# Patient Record
Sex: Male | Born: 1945 | Race: Black or African American | Hispanic: No | Marital: Married | Smoking: Never smoker
Health system: Southern US, Community
[De-identification: ages and names within clinical notes are randomized; demographics above are authoritative.]

## PROBLEM LIST (undated history)

## (undated) DIAGNOSIS — I719 Aortic aneurysm of unspecified site, without rupture: Secondary | ICD-10-CM

## (undated) DIAGNOSIS — F4310 Post-traumatic stress disorder, unspecified: Secondary | ICD-10-CM

## (undated) DIAGNOSIS — G4733 Obstructive sleep apnea (adult) (pediatric): Secondary | ICD-10-CM

## (undated) DIAGNOSIS — M1990 Unspecified osteoarthritis, unspecified site: Secondary | ICD-10-CM

## (undated) DIAGNOSIS — I10 Essential (primary) hypertension: Secondary | ICD-10-CM

## (undated) DIAGNOSIS — G709 Myoneural disorder, unspecified: Secondary | ICD-10-CM

## (undated) DIAGNOSIS — Z7739 Contact with and (suspected) exposure to other war theater: Secondary | ICD-10-CM

## (undated) DIAGNOSIS — K635 Polyp of colon: Secondary | ICD-10-CM

## (undated) DIAGNOSIS — E119 Type 2 diabetes mellitus without complications: Secondary | ICD-10-CM

## (undated) DIAGNOSIS — R519 Headache, unspecified: Secondary | ICD-10-CM

## (undated) DIAGNOSIS — R51 Headache: Secondary | ICD-10-CM

## (undated) DIAGNOSIS — Z8739 Personal history of other diseases of the musculoskeletal system and connective tissue: Secondary | ICD-10-CM

## (undated) DIAGNOSIS — Z9989 Dependence on other enabling machines and devices: Secondary | ICD-10-CM

## (undated) DIAGNOSIS — E1140 Type 2 diabetes mellitus with diabetic neuropathy, unspecified: Secondary | ICD-10-CM

## (undated) DIAGNOSIS — F32A Depression, unspecified: Secondary | ICD-10-CM

## (undated) DIAGNOSIS — N189 Chronic kidney disease, unspecified: Secondary | ICD-10-CM

## (undated) DIAGNOSIS — R569 Unspecified convulsions: Secondary | ICD-10-CM

## (undated) DIAGNOSIS — D649 Anemia, unspecified: Secondary | ICD-10-CM

## (undated) DIAGNOSIS — Z9289 Personal history of other medical treatment: Secondary | ICD-10-CM

## (undated) DIAGNOSIS — E11319 Type 2 diabetes mellitus with unspecified diabetic retinopathy without macular edema: Secondary | ICD-10-CM

## (undated) DIAGNOSIS — I639 Cerebral infarction, unspecified: Secondary | ICD-10-CM

## (undated) DIAGNOSIS — Z77098 Contact with and (suspected) exposure to other hazardous, chiefly nonmedicinal, chemicals: Secondary | ICD-10-CM

## (undated) HISTORY — PX: CATARACT EXTRACTION W/ INTRAOCULAR LENS  IMPLANT, BILATERAL: SHX1307

## (undated) HISTORY — PX: EYE SURGERY: SHX253

## (undated) HISTORY — DX: Unspecified convulsions: R56.9

## (undated) HISTORY — PX: BONE GRAFT HIP ILIAC CREST: SUR159

## (undated) HISTORY — PX: TUMOR REMOVAL: SHX12

---

## 2007-12-18 ENCOUNTER — Emergency Department (HOSPITAL_COMMUNITY): Admission: EM | Admit: 2007-12-18 | Discharge: 2007-12-18 | Payer: Self-pay | Admitting: Family Medicine

## 2012-09-04 DIAGNOSIS — I639 Cerebral infarction, unspecified: Secondary | ICD-10-CM

## 2012-09-04 HISTORY — DX: Cerebral infarction, unspecified: I63.9

## 2013-02-20 ENCOUNTER — Emergency Department (HOSPITAL_COMMUNITY): Payer: Medicare Other

## 2013-02-20 ENCOUNTER — Inpatient Hospital Stay (HOSPITAL_COMMUNITY)
Admission: EM | Admit: 2013-02-20 | Discharge: 2013-02-24 | DRG: 066 | Disposition: A | Discharge status: Home / Self Care | Payer: Medicare Other | Attending: Internal Medicine | Admitting: Internal Medicine

## 2013-02-20 ENCOUNTER — Encounter (HOSPITAL_COMMUNITY): Payer: Self-pay | Admitting: Emergency Medicine

## 2013-02-20 DIAGNOSIS — E785 Hyperlipidemia, unspecified: Secondary | ICD-10-CM | POA: Diagnosis present

## 2013-02-20 DIAGNOSIS — R32 Unspecified urinary incontinence: Secondary | ICD-10-CM | POA: Diagnosis present

## 2013-02-20 DIAGNOSIS — Z9181 History of falling: Secondary | ICD-10-CM

## 2013-02-20 DIAGNOSIS — R42 Dizziness and giddiness: Secondary | ICD-10-CM | POA: Diagnosis present

## 2013-02-20 DIAGNOSIS — I10 Essential (primary) hypertension: Secondary | ICD-10-CM | POA: Diagnosis present

## 2013-02-20 DIAGNOSIS — E11319 Type 2 diabetes mellitus with unspecified diabetic retinopathy without macular edema: Secondary | ICD-10-CM | POA: Diagnosis present

## 2013-02-20 DIAGNOSIS — R531 Weakness: Secondary | ICD-10-CM | POA: Diagnosis present

## 2013-02-20 DIAGNOSIS — I6322 Cerebral infarction due to unspecified occlusion or stenosis of basilar arteries: Principal | ICD-10-CM | POA: Diagnosis present

## 2013-02-20 DIAGNOSIS — E1139 Type 2 diabetes mellitus with other diabetic ophthalmic complication: Secondary | ICD-10-CM | POA: Diagnosis present

## 2013-02-20 DIAGNOSIS — E1149 Type 2 diabetes mellitus with other diabetic neurological complication: Secondary | ICD-10-CM | POA: Diagnosis present

## 2013-02-20 DIAGNOSIS — Z7982 Long term (current) use of aspirin: Secondary | ICD-10-CM

## 2013-02-20 DIAGNOSIS — H9319 Tinnitus, unspecified ear: Secondary | ICD-10-CM | POA: Diagnosis present

## 2013-02-20 DIAGNOSIS — E1142 Type 2 diabetes mellitus with diabetic polyneuropathy: Secondary | ICD-10-CM | POA: Diagnosis present

## 2013-02-20 DIAGNOSIS — Z8673 Personal history of transient ischemic attack (TIA), and cerebral infarction without residual deficits: Secondary | ICD-10-CM

## 2013-02-20 DIAGNOSIS — I639 Cerebral infarction, unspecified: Secondary | ICD-10-CM

## 2013-02-20 DIAGNOSIS — E119 Type 2 diabetes mellitus without complications: Secondary | ICD-10-CM | POA: Diagnosis present

## 2013-02-20 DIAGNOSIS — R410 Disorientation, unspecified: Secondary | ICD-10-CM

## 2013-02-20 DIAGNOSIS — R9402 Abnormal brain scan: Secondary | ICD-10-CM | POA: Diagnosis present

## 2013-02-20 DIAGNOSIS — I152 Hypertension secondary to endocrine disorders: Secondary | ICD-10-CM | POA: Diagnosis present

## 2013-02-20 DIAGNOSIS — I672 Cerebral atherosclerosis: Secondary | ICD-10-CM | POA: Diagnosis present

## 2013-02-20 HISTORY — DX: Type 2 diabetes mellitus with diabetic neuropathy, unspecified: E11.40

## 2013-02-20 HISTORY — DX: Type 2 diabetes mellitus with unspecified diabetic retinopathy without macular edema: E11.319

## 2013-02-20 HISTORY — DX: Essential (primary) hypertension: I10

## 2013-02-20 LAB — CBC WITH DIFFERENTIAL/PLATELET
Basophils Absolute: 0 10*3/uL (ref 0.0–0.1)
Basophils Relative: 1 % (ref 0–1)
Eosinophils Absolute: 0.2 10*3/uL (ref 0.0–0.7)
Eosinophils Relative: 4 % (ref 0–5)
Lymphocytes Relative: 20 % (ref 12–46)
MCH: 30.4 pg (ref 26.0–34.0)
MCHC: 36 g/dL (ref 30.0–36.0)
MCV: 84.2 fL (ref 78.0–100.0)
Platelets: 161 10*3/uL (ref 150–400)
RDW: 13 % (ref 11.5–15.5)
WBC: 5.8 10*3/uL (ref 4.0–10.5)

## 2013-02-20 LAB — BASIC METABOLIC PANEL
Calcium: 9.3 mg/dL (ref 8.4–10.5)
GFR calc Af Amer: 54 mL/min — ABNORMAL LOW (ref >90)
GFR calc non Af Amer: 46 mL/min — ABNORMAL LOW (ref >90)
Sodium: 135 mEq/L (ref 135–145)

## 2013-02-20 LAB — URINALYSIS, ROUTINE W REFLEX MICROSCOPIC
Ketones, ur: NEGATIVE mg/dL
Leukocytes, UA: NEGATIVE
Protein, ur: 30 mg/dL — AB
Urobilinogen, UA: 1 mg/dL (ref 0.0–1.0)

## 2013-02-20 LAB — URINE MICROSCOPIC-ADD ON

## 2013-02-20 MED ORDER — SODIUM CHLORIDE 0.9 % IV SOLN: INTRAVENOUS | Status: DC

## 2013-02-20 MED ORDER — GADOBENATE DIMEGLUMINE 529 MG/ML IV SOLN
20.0000 mL | Freq: Once | INTRAVENOUS | Status: AC
  Administered 2013-02-20: 20 mL via INTRAVENOUS

## 2013-02-20 MED ORDER — SODIUM CHLORIDE 0.9 % IV BOLUS (SEPSIS)
500.0000 mL | Freq: Once | INTRAVENOUS | Status: AC
  Administered 2013-02-20: 500 mL via INTRAVENOUS

## 2013-02-20 NOTE — ED Notes (Signed)
Pt c/o weakness x 2 weeks. Pt stated to EMS that he is more weak on the right side and now is unable to walk. EMS tried to walk on scene and took 1 step and was unable to walk. Pt has been waiting 1 year to see PCP and is unable to have MRI until July. Family stated to EMS that his speech seems slurred. BP-156/93 hr-75 O2sat- 98%ra IV 20g R hand.

## 2013-02-20 NOTE — ED Notes (Signed)
Pt. C/o right sided weakness. States had an "episode" last week and hasn't fully gained back strength. Has been using a walker at home with is abnormal for pt. Pt also had two "episodes" of increased weakness today. Pt. Grip equal. Mild right sided facial droop noted. Pt. Also c/o slurred speech, none noted by this nurse but pt. States he sounds different to himself.

## 2013-02-20 NOTE — ED Provider Notes (Addendum)
History     CSN: [id number]  Arrival date & time 02/20/13  1627   First MD Initiated Contact with Patient 02/20/13 1639      Chief Complaint  Patient presents with  . Weakness    (Consider location/radiation/quality/duration/timing/severity/associated sxs/prior treatment) HPI Comments: Cody Silva is a 67 y.o. male who presents for evaluation of confusion and balance problems. The symptoms are intermittent. He has had 5 episodes. He has been evaluated by his PCP, at the [facility]. For evaluation, he has had a CT of his head and a cardiac echo. His doctor wants to do an MRA of his neck. Last week, his doctor changed his medicine, by discontinuing several antihypertensives. He was also taken off oxybutynin. Since being off the oxybutynin. He has had increasing episodes of urinary urgency. He has had to use a diaper because of frequent urinary incontinence. He does not have ongoing chest pain, shortness of breath, cough, abdominal pain, back pain, or paresthesias. He describes his balance is worse when attempting to walk. His son is with him, and thinks that he has some weakness in his right leg. The son noticed this, this morning, when the patient was trying to walk. The patient usually walks slowly. There are no other known modifying factors.  Patient is a 66 y.o. male presenting with weakness. The history is provided by the patient, the spouse and a relative.  Weakness    Past Medical History  Diagnosis Date  . Hypertension   . Diabetes mellitus without complication   . Diabetic retinopathy   . Diabetic neuropathy     Past Surgical History  Procedure Laterality Date  . Eye surgery    . Arm surgery      Tumor removed     No family history on file.  History  Substance Use Topics  . Smoking status: Never Smoker   . Smokeless tobacco: Not on file  . Alcohol Use: No      Review of Systems  Neurological: Positive for weakness.  All other systems reviewed and are  negative.    Allergies  Review of patient's allergies indicates no known allergies.  Home Medications   Current Outpatient Rx  Name  Route  Sig  Dispense  Refill  . albuterol (PROVENTIL HFA;VENTOLIN HFA) 108 (90 BASE) MCG/ACT inhaler   Inhalation   Inhale 2 puffs into the lungs every 6 (six) hours as needed for wheezing.         [name] aspirin EC 81 MG tablet   Oral   Take 81 mg by mouth daily.         . Aspirin-Salicylamide-Caffeine (BC HEADACHE POWDER PO)   Oral   Take 1 packet by mouth daily as needed (for headache).         . carvedilol (COREG) 25 MG tablet   Oral   Take 25 mg by mouth 2 (two) times daily with a meal.         . citalopram (CELEXA) 40 MG tablet   Oral   Take 20 mg by mouth daily.         . cloNIDine (CATAPRES - DOSED IN MG/24 HR) 0.3 mg/24hr   Transdermal   Place 1 patch onto the skin once a week.         . fluocinonide cream (LIDEX) 0.05 %   Topical   Apply 1 application topically 2 (two) times daily.         . fluticasone (FLONASE) 50 MCG/ACT nasal  spray   Nasal   Place 2 sprays into the nose daily.         [name] latanoprost (XALATAN) 0.005 % ophthalmic solution   Left Eye   Place 1 drop into the left eye at bedtime.         [name] losartan (COZAAR) 100 MG tablet   Oral   Take 100 mg by mouth daily.         . Multiple Vitamins-Minerals (MULTIVITAMIN PO)   Oral   Take 1 tablet by mouth daily.         . polyvinyl alcohol (LIQUIFILM TEARS) 1.4 % ophthalmic solution   Both Eyes   Place 1 drop into both eyes 5 (five) times daily as needed (for dry eyes).         . simvastatin (ZOCOR) 40 MG tablet   Oral   Take 40 mg by mouth every evening.         . Skin Protectants, Misc. (EUCERIN) cream   Topical   Apply 1 application topically as needed (for dry feet).         [name] spironolactone (ALDACTONE) 25 MG tablet   Oral   Take 25 mg by mouth daily.           BP 163/93  Pulse 70  Temp(Src) 98.1 F (36.7 C) (Oral)   Resp 17  SpO2 96%  Physical Exam  Nursing note and vitals reviewed. Constitutional: He is oriented to person, place, and time. He appears well-developed and well-nourished.  HENT:  Head: Normocephalic and atraumatic.  Right Ear: External ear normal.  Left Ear: External ear normal.  Eyes: Conjunctivae and EOM are normal. Pupils are equal, round, and reactive to light.  Neck: Normal range of motion and phonation normal. Neck supple.  Cardiovascular: Normal rate, regular rhythm, normal heart sounds and intact distal pulses.   Pulmonary/Chest: Effort normal and breath sounds normal. No respiratory distress. He has no wheezes. He has no rales. He exhibits no bony tenderness.  Abdominal: Soft. Normal appearance. There is no tenderness. There is no guarding.  Musculoskeletal: Normal range of motion.  Neurological: He is alert and oriented to person, place, and time. He has normal strength. No cranial nerve deficit or sensory deficit. He exhibits normal muscle tone. Coordination normal.  No aphasia, or dysarthria  Skin: Skin is warm, dry and intact.  Psychiatric: He has a normal mood and affect. His behavior is normal. Judgment and thought content normal.    ED Course  Procedures (including critical care time)      Date: 02/20/13  Rate: 71  Rhythm: normal sinus rhythm  QRS Axis: normal  PR and QT Intervals: normal  ST/T Wave abnormalities: nonspecific T wave changes  PR and QRS Conduction Disutrbances:none  Narrative Interpretation:   Old EKG Reviewed: none available  11:26 PM-Consult complete with Dr Nevada Crane- Radiology. Patient case explained and discussed. He believes that pt has a left. Call ended at 11:31 PM   00:06 PM-Consult complete with . Patient case explained and discussed. He agrees to see patient for further evaluation and treatment, as Optometrist. Call ended at 00:10   12:14 AM-[facility] Patient case explained and discussed. She agrees  to admit patient for further evaluation and treatment. Call ended at 12:18 AM   Results for orders placed during the hospital encounter of 02/20/13  CBC WITH DIFFERENTIAL      Result Value Range   WBC 5.8  4.0 - 10.5 K/uL  RBC 4.25  4.22 - 5.81 MIL/uL   Hemoglobin 12.9 (*) 13.0 - 17.0 g/dL   HCT 35.8 (*) 39.0 - 52.0 %   MCV 84.2  78.0 - 100.0 fL   MCH 30.4  26.0 - 34.0 pg   MCHC 36.0  30.0 - 36.0 g/dL   RDW 13.0  11.5 - 15.5 %   Platelets 161  150 - 400 K/uL   Neutrophils Relative % 71  43 - 77 %   Neutro Abs 4.1  1.7 - 7.7 K/uL   Lymphocytes Relative 20  12 - 46 %   Lymphs Abs 1.1  0.7 - 4.0 K/uL   Monocytes Relative 6  3 - 12 %   Monocytes Absolute 0.3  0.1 - 1.0 K/uL   Eosinophils Relative 4  0 - 5 %   Eosinophils Absolute 0.2  0.0 - 0.7 K/uL   Basophils Relative 1  0 - 1 %   Basophils Absolute 0.0  0.0 - 0.1 K/uL  BASIC METABOLIC PANEL      Result Value Range   Sodium 135  135 - 145 mEq/L   Potassium 3.6  3.5 - 5.1 mEq/L   Chloride 96  96 - 112 mEq/L   CO2 30  19 - 32 mEq/L   Glucose, Bld 201 (*) 70 - 99 mg/dL   BUN 24 (*) 6 - 23 mg/dL   Creatinine, Ser 1.52 (*) 0.50 - 1.35 mg/dL   Calcium 9.3  8.4 - 10.5 mg/dL   GFR calc non Af Amer 46 (*) >90 mL/min   GFR calc Af Amer 54 (*) >90 mL/min  URINALYSIS, ROUTINE W REFLEX MICROSCOPIC      Result Value Range   Color, Urine YELLOW  YELLOW   APPearance CLEAR  CLEAR   Specific Gravity, Urine 1.020  1.005 - 1.030   pH 6.0  5.0 - 8.0   Glucose, UA 100 (*) NEGATIVE mg/dL   Hgb urine dipstick NEGATIVE  NEGATIVE   Bilirubin Urine NEGATIVE  NEGATIVE   Ketones, ur NEGATIVE  NEGATIVE mg/dL   Protein, ur 30 (*) NEGATIVE mg/dL   Urobilinogen, UA 1.0  0.0 - 1.0 mg/dL   Nitrite NEGATIVE  NEGATIVE   Leukocytes, UA NEGATIVE  NEGATIVE  URINE MICROSCOPIC-ADD ON      Result Value Range   Squamous Epithelial / LPF RARE  RARE   RBC / HPF 0-2  <3 RBC/hpf   Cody Virgel Paling Wo Contrast  02/20/2013   *RADIOLOGY REPORT*  Clinical Data:   67 year old male with right side facial droop and slurred speech.  Difficulty with balance.  Right side weakness.  Contrast: 35mL MULTIHANCE GADOBENATE DIMEGLUMINE 529 MG/ML IV SOLN  Comparison: None.  MRI HEAD WITHOUT AND WITH CONTRAST  Technique: Multiplanar, multiecho pulse sequences of the brain and surrounding structures were obtained according to standard protocol without and with intravenous contrast.  Findings:  There are sharply demarcated areas of restricted diffusion in the body of the corpus callosum and cingulate gyri, more so the right.  These are associated with T2 and FLAIR hyperintensity plus expansion of the corpus callosum.  Postcontrast images show mild if any enhancement along the periphery of the abnormal signal.  The area of abnormality encompasses 44 x 26 x 13 mm (AP by transverse by CC).  No definite hemispheric white matter involvement.  No associated hemorrhage.  No other diffusion abnormality. Major intracranial vascular flow voids are preserved, with a degree of intracranial artery dolichoectasia.  See MRA findings  below.  Superimposed mild for age scattered periventricular and subcortical cerebral white matter T2 and FLAIR hyperintensity elsewhere.  No cortical encephalomalacia.  Deep gray matter nuclei, brainstem, cerebellum, pituitary, cervicomedullary junction and visualized cervical spine are within normal limits.  Visualized orbit soft tissues are within normal limits.  Visualized paranasal sinuses and mastoids are clear.  Grossly normal visualized internal auditory structures. Visualized bone marrow signal is within normal limits.  Negative scalp soft tissues.  IMPRESSION: 1.  Abnormal mass-like signal with restricted diffusion in the body of the corpus callosum, and also involving the cingulate gyri more so the right.  Minimal if any associated enhancement.  No associated hemorrhage or significant mass effect on adjacent structures. 2.  Top differential considerations for #1  include acute ACA / median artery corpus callosum infarcts (favored, see neck MRA findings below) versus a primary brain tumor simulating a stroke (moderate to high-grade glioma). 3.  In light of #2, recommend imaging surveillance (e.g. 6 - 8 weeks) to confirm expected post ischemic evolution. 4.  Otherwise mild for age nonspecific white matter signal changes.  Study discussed by telephone with Dr. Daleen Bo on 02/20/2013 at 2330 hours.  MRA HEAD WITHOUT CONTRAST  Technique: Angiographic images of the Circle of Willis were obtained using MRA technique without  intravenous contrast.  Findings:  Degraded by a combination of MOTSA and motion artifact.  Antegrade flow in the posterior circulation with relatively codominant distal vertebral arteries.  Mild to moderate distal vertebral artery irregularity, greater on the left.  Moderate stenosis of the left V4 segment proximal to the left PICA origin. Right PICA origin is patent.  Vertebrobasilar junction is patent, but the basilar artery is irregular with a moderate to severe mid basilar stenosis.  The basilar tip is patent.  SCA origins are patent.  Fetal type bilateral PCA origins, more so the left, with evidence of superimposed P1 segment atherosclerosis.  Bilateral PCA branches are within normal limits.  Antegrade flow in both ICA siphons with ICA dolichoectasia, mild irregularity, and no stenosis.  Ophthalmic and posterior communicating artery origins are within normal limits.  Carotid termini are patent, but the left A1 segment is dominant. The anterior communicating artery is present.  There is a median artery of the corpus callosum which it is difficult to visualize much beyond its origin.  The other ACA branches have more normal appearance.  Both MCA origins are within normal limits.  Bilateral MCA branches are within normal limits.  IMPRESSION: 1.  Intracranial MRA degraded by a combination of MOTSA and motion artifact. 2.  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 3.  However, see the neck MRA findings below, which also included these portions of the intracranial circulation. 4.  Dolichoectasia of the ICA siphons.  Dominant left ACA A1 segment.  MRA NECK WITHOUT AND WITH CONTRAST  Technique:  Angiographic images of the neck were obtained using MRA technique without and with intravenous contrast.  Carotid stenosis measurements (when applicable) are obtained utilizing NASCET criteria, using the distal internal carotid diameter as the denominator.  Findings:  Precontrast time-of-flight images of the neck demonstrate antegrade flow in both cervical carotid and vertebral arteries. Normal time-of-flight appearance of the carotid bifurcations.  Antegrade flow continues to the skull base.  Postcontrast images suggest a bovine arch configuration.  Great vessel origins are patent without significant stenosis.  The right common carotid artery is mildly tortuous.  There is  mild to moderate irregularity at the posterior right ICA origin compatible with atherosclerotic plaque.  However, no hemodynamically significant cervical ICA stenosis occurs.  The visible right ICA siphon is mildly [place]:  The visible right ICA siphon is mildly dolichoectatic is seen on the intracranial exam. The right ACA A1 segment is diminutive.  The left common carotid artery is normal.  The left carotid bifurcation is normal.  The cervical left ICA is normal aside from mild tortuosity.  The left ICA siphon and dolichoectatic as seen on the intracranial study.  The left ICA terminus is patent along with the MCA and ACA origins.  The proximal ACA branches appear normal. There is a median artery of the corpus callosum which demonstrates irregularity and a 6 mm flow gap at the level of the body of the corpus callosum.  There does appear to be preserved distal flow.  No hemodynamically significant proximal subclavian artery  stenosis is identified.  Both vertebral artery origins are within normal limits.  Proximal left vertebral arteries tortuous.  The cervical vertebral arteries are relatively codominant and within normal limits throughout the neck.  Intracranial vertebral arteries are mildly irregular, more so the left.  There does not appear to be hemodynamically significant stenosis at the distal left vertebral artery or vertebrobasilar junction.  There is irregularity of the mid basilar artery, the these images suggest at most moderate stenosis.  IMPRESSION: 1.  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  This supports the ischemic etiology very of the MRI abnormality. 2. The MRA images do not suggest a hemodynamically significant basilar artery stenosis.  Also, the distal vertebral arteries appear more normal than they did on the intracranial portion. 3.  Mild atherosclerosis at the right ICA origin without hemodynamically significant stenosis. 4.  Otherwise negative neck MRA.   Original Report Authenticated By: Roselyn Reef, M.D.   Cody Angiogram Neck W Wo Contrast  02/20/2013   *RADIOLOGY REPORT*  Clinical Data:  67 year old male with right side facial droop and slurred speech.  Difficulty with balance.  Right side weakness.  Contrast: 21mL MULTIHANCE GADOBENATE DIMEGLUMINE 529 MG/ML IV SOLN  Comparison: None.  MRI HEAD WITHOUT AND WITH CONTRAST  Technique: Multiplanar, multiecho pulse sequences of the brain and surrounding structures were obtained according to standard protocol without and with intravenous contrast.  Findings:  There are sharply demarcated areas of restricted diffusion in the body of the corpus callosum and cingulate gyri, more so the right.  These are associated with T2 and FLAIR hyperintensity plus expansion of the corpus callosum.  Postcontrast images show mild if any enhancement along the periphery of the  abnormal signal.  The area of abnormality encompasses 44 x 26 x 13 mm (AP by transverse by CC).  No definite hemispheric white matter involvement.  No associated hemorrhage.  No other diffusion abnormality. Major intracranial vascular flow voids are preserved, with a degree of intracranial artery dolichoectasia.  See MRA findings below.  Superimposed mild for age scattered periventricular and subcortical cerebral white matter T2 and FLAIR hyperintensity elsewhere.  No cortical encephalomalacia.  Deep gray matter nuclei, brainstem, cerebellum, pituitary, cervicomedullary junction and visualized cervical spine are within normal limits.  Visualized orbit soft tissues are within normal limits.  Visualized paranasal sinuses and mastoids are clear.  Grossly normal visualized internal auditory structures. Visualized bone marrow signal is within normal limits.  Negative scalp soft tissues.  IMPRESSION: 1.  Abnormal mass-like signal with restricted diffusion in the body of the corpus callosum, and also involving the cingulate gyri more so the right.  Minimal if any associated enhancement.  No associated hemorrhage or significant mass effect on adjacent structures. 2.  Top differential considerations for #1 include acute ACA / median artery corpus callosum infarcts (favored, see neck MRA findings below) versus a primary brain tumor simulating a stroke (moderate to high-grade glioma). 3.  In light of #2, recommend imaging surveillance (e.g. 6 - 8 weeks) to confirm expected post ischemic evolution. 4.  Otherwise mild for age nonspecific white matter signal changes.  Study discussed by telephone with Dr. Daleen Bo on 02/20/2013 at 2330 hours.  MRA HEAD WITHOUT CONTRAST  Technique: Angiographic images of the Circle of Willis were obtained using MRA technique without  intravenous contrast.  Findings:  Degraded by a combination of MOTSA and motion artifact.  Antegrade flow in the posterior circulation with relatively codominant  distal vertebral arteries.  Mild to moderate distal vertebral artery irregularity, greater on the left.  Moderate stenosis of the left V4 segment proximal to the left PICA origin. Right PICA origin is patent.  Vertebrobasilar junction is patent, but the basilar artery is irregular with a moderate to severe mid basilar stenosis.  The basilar tip is patent.  SCA origins are patent.  Fetal type bilateral PCA origins, more so the left, with evidence of superimposed P1 segment atherosclerosis.  Bilateral PCA branches are within normal limits.  Antegrade flow in both ICA siphons with ICA dolichoectasia, mild irregularity, and no stenosis.  Ophthalmic and posterior communicating artery origins are within normal limits.  Carotid termini are patent, but the left A1 segment is dominant. The anterior communicating artery is present.  There is a median artery of the corpus callosum which it is difficult to visualize much beyond its origin.  The other ACA branches have more normal appearance.  Both MCA origins are within normal limits.  Bilateral MCA branches are within normal limits.  IMPRESSION: 1.  Intracranial MRA degraded by a combination of MOTSA and motion artifact. 2.  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 3.  However, see the neck MRA findings below, which also included these portions of the intracranial circulation. 4.  Dolichoectasia of the ICA siphons.  Dominant left ACA A1 segment.  MRA NECK WITHOUT AND WITH CONTRAST  Technique:  Angiographic images of the neck were obtained using MRA technique without and with intravenous contrast.  Carotid stenosis measurements (when applicable) are obtained utilizing NASCET criteria, using the distal internal carotid diameter as the denominator.  Findings:  Precontrast time-of-flight images of the neck demonstrate antegrade flow in both cervical carotid and vertebral arteries. Normal  time-of-flight appearance of the carotid bifurcations.  Antegrade flow continues to the skull base.  Postcontrast images suggest a bovine arch configuration.  Great vessel origins are patent without significant stenosis.  The right common carotid artery is mildly tortuous.  There is mild to moderate irregularity at the posterior right ICA origin compatible with atherosclerotic plaque.  However, no hemodynamically significant cervical ICA stenosis occurs.  The visible right ICA siphon is mildly [place]:  The visible right ICA siphon is mildly dolichoectatic is seen on the intracranial exam. The right ACA A1 segment is diminutive.  The left common carotid artery is normal.  The left carotid bifurcation is normal.  The cervical left ICA is normal aside from  mild tortuosity.  The left ICA siphon and dolichoectatic as seen on the intracranial study.  The left ICA terminus is patent along with the MCA and ACA origins.  The proximal ACA branches appear normal. There is a median artery of the corpus callosum which demonstrates irregularity and a 6 mm flow gap at the level of the body of the corpus callosum.  There does appear to be preserved distal flow.  No hemodynamically significant proximal subclavian artery stenosis is identified.  Both vertebral artery origins are within normal limits.  Proximal left vertebral arteries tortuous.  The cervical vertebral arteries are relatively codominant and within normal limits throughout the neck.  Intracranial vertebral arteries are mildly irregular, more so the left.  There does not appear to be hemodynamically significant stenosis at the distal left vertebral artery or vertebrobasilar junction.  There is irregularity of the mid basilar artery, the these images suggest at most moderate stenosis.  IMPRESSION: 1.  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  This supports the ischemic  etiology very of the MRI abnormality. 2. The MRA images do not suggest a hemodynamically significant basilar artery stenosis.  Also, the distal vertebral arteries appear more normal than they did on the intracranial portion. 3.  Mild atherosclerosis at the right ICA origin without hemodynamically significant stenosis. 4.  Otherwise negative neck MRA.   Original Report Authenticated By: Roselyn Reef, M.D.   Cody Silva Wo Contrast  02/20/2013   *RADIOLOGY REPORT*  Clinical Data:  67 year old male with right side facial droop and slurred speech.  Difficulty with balance.  Right side weakness.  Contrast: 53mL MULTIHANCE GADOBENATE DIMEGLUMINE 529 MG/ML IV SOLN  Comparison: None.  MRI HEAD WITHOUT AND WITH CONTRAST  Technique: Multiplanar, multiecho pulse sequences of the brain and surrounding structures were obtained according to standard protocol without and with intravenous contrast.  Findings:  There are sharply demarcated areas of restricted diffusion in the body of the corpus callosum and cingulate gyri, more so the right.  These are associated with T2 and FLAIR hyperintensity plus expansion of the corpus callosum.  Postcontrast images show mild if any enhancement along the periphery of the abnormal signal.  The area of abnormality encompasses 44 x 26 x 13 mm (AP by transverse by CC).  No definite hemispheric white matter involvement.  No associated hemorrhage.  No other diffusion abnormality. Major intracranial vascular flow voids are preserved, with a degree of intracranial artery dolichoectasia.  See MRA findings below.  Superimposed mild for age scattered periventricular and subcortical cerebral white matter T2 and FLAIR hyperintensity elsewhere.  No cortical encephalomalacia.  Deep gray matter nuclei, brainstem, cerebellum, pituitary, cervicomedullary junction and visualized cervical spine are within normal limits.  Visualized orbit soft tissues are within normal limits.  Visualized paranasal sinuses and  mastoids are clear.  Grossly normal visualized internal auditory structures. Visualized bone marrow signal is within normal limits.  Negative scalp soft tissues.  IMPRESSION: 1.  Abnormal mass-like signal with restricted diffusion in the body of the corpus callosum, and also involving the cingulate gyri more so the right.  Minimal if any associated enhancement.  No associated hemorrhage or significant mass effect on adjacent structures. 2.  Top differential considerations for #1 include acute ACA / median artery corpus callosum infarcts (favored, see neck MRA findings below) versus a primary brain tumor simulating a stroke (moderate to high-grade glioma). 3.  In light of #2, recommend imaging surveillance (e.g. 6 - 8 weeks) to confirm expected post ischemic evolution. 4.  Otherwise mild for age nonspecific white matter signal changes.  Study discussed by telephone with Dr. Daleen Bo on 02/20/2013 at 2330 hours.  MRA HEAD WITHOUT CONTRAST  Technique: Angiographic images of the Circle of [place] were obtained using MRA technique without  intravenous contrast.  Findings:  Degraded by a combination of MOTSA and motion artifact.  Antegrade flow in the posterior circulation with relatively codominant distal vertebral arteries.  Mild to moderate distal vertebral artery irregularity, greater on the left.  Moderate stenosis of the left V4 segment proximal to the left PICA origin. Right PICA origin is patent.  Vertebrobasilar junction is patent, but the basilar artery is irregular with a moderate to severe mid basilar stenosis.  The basilar tip is patent.  SCA origins are patent.  Fetal type bilateral PCA origins, more so the left, with evidence of superimposed P1 segment atherosclerosis.  Bilateral PCA branches are within normal limits.  Antegrade flow in both ICA siphons with ICA dolichoectasia, mild irregularity, and no stenosis.  Ophthalmic and posterior communicating artery origins are within normal limits.  Carotid  termini are patent, but the left A1 segment is dominant. The anterior communicating artery is present.  There is a median artery of the corpus callosum which it is difficult to visualize much beyond its origin.  The other ACA branches have more normal appearance.  Both MCA origins are within normal limits.  Bilateral MCA branches are within normal limits.  IMPRESSION: 1.  Intracranial MRA degraded by a combination of MOTSA and motion artifact. 2.  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 3.  However, see the neck MRA findings below, which also included these portions of the intracranial circulation. 4.  Dolichoectasia of the ICA siphons.  Dominant left ACA A1 segment.  MRA NECK WITHOUT AND WITH CONTRAST  Technique:  Angiographic images of the neck were obtained using MRA technique without and with intravenous contrast.  Carotid stenosis measurements (when applicable) are obtained utilizing NASCET criteria, using the distal internal carotid diameter as the denominator.  Findings:  Precontrast time-of-flight images of the neck demonstrate antegrade flow in both cervical carotid and vertebral arteries. Normal time-of-flight appearance of the carotid bifurcations.  Antegrade flow continues to the skull base.  Postcontrast images suggest a bovine arch configuration.  Great vessel origins are patent without significant stenosis.  The right common carotid artery is mildly tortuous.  There is mild to moderate irregularity at the posterior right ICA origin compatible with atherosclerotic plaque.  However, no hemodynamically significant cervical ICA stenosis occurs.  The visible right ICA siphon is mildly [place]:  The visible right ICA siphon is mildly dolichoectatic is seen on the intracranial exam. The right ACA A1 segment is diminutive.  The left common carotid artery is normal.  The left carotid bifurcation is normal.  The cervical left  ICA is normal aside from mild tortuosity.  The left ICA siphon and dolichoectatic as seen on the intracranial study.  The left ICA terminus is patent along with the MCA and ACA origins.  The proximal ACA branches appear normal. There is a median artery of the corpus callosum which demonstrates irregularity and a 6 mm flow gap at the level of the body of the corpus callosum.  There does appear to be preserved distal flow.  No hemodynamically significant proximal subclavian  artery stenosis is identified.  Both vertebral artery origins are within normal limits.  Proximal left vertebral arteries tortuous.  The cervical vertebral arteries are relatively codominant and within normal limits throughout the neck.  Intracranial vertebral arteries are mildly irregular, more so the left.  There does not appear to be hemodynamically significant stenosis at the distal left vertebral artery or vertebrobasilar junction.  There is irregularity of the mid basilar artery, the these images suggest at most moderate stenosis.  IMPRESSION: 1.  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  This supports the ischemic etiology very of the MRI abnormality. 2. The MRA images do not suggest a hemodynamically significant basilar artery stenosis.  Also, the distal vertebral arteries appear more normal than they did on the intracranial portion. 3.  Mild atherosclerosis at the right ICA origin without hemodynamically significant stenosis. 4.  Otherwise negative neck MRA.   Original Report Authenticated By: Roselyn Reef, M.D.     1. CVA (cerebral infarction)   2. Confusion       MDM  Brain infarct versus tumor requiring admission for further diagnostic evaluation and treatment  Plan: Admit to hospitalist with consultation by neurological, hospitalist        Richarda Blade, MD 02/21/13 [place], MD 02/21/13 [phone]

## 2013-02-20 NOTE — ED Notes (Signed)
EKG given to Dr. Eulis Foster. Copy placed in pt chart.

## 2013-02-21 ENCOUNTER — Encounter (HOSPITAL_COMMUNITY): Payer: Self-pay | Admitting: Internal Medicine

## 2013-02-21 ENCOUNTER — Inpatient Hospital Stay (HOSPITAL_COMMUNITY): Payer: Medicare Other

## 2013-02-21 DIAGNOSIS — R5383 Other fatigue: Secondary | ICD-10-CM

## 2013-02-21 DIAGNOSIS — E1159 Type 2 diabetes mellitus with other circulatory complications: Secondary | ICD-10-CM | POA: Diagnosis present

## 2013-02-21 DIAGNOSIS — I359 Nonrheumatic aortic valve disorder, unspecified: Secondary | ICD-10-CM

## 2013-02-21 DIAGNOSIS — R531 Weakness: Secondary | ICD-10-CM | POA: Diagnosis present

## 2013-02-21 DIAGNOSIS — R42 Dizziness and giddiness: Secondary | ICD-10-CM

## 2013-02-21 DIAGNOSIS — F29 Unspecified psychosis not due to a substance or known physiological condition: Secondary | ICD-10-CM

## 2013-02-21 DIAGNOSIS — E119 Type 2 diabetes mellitus without complications: Secondary | ICD-10-CM | POA: Diagnosis present

## 2013-02-21 DIAGNOSIS — I6350 Cerebral infarction due to unspecified occlusion or stenosis of unspecified cerebral artery: Secondary | ICD-10-CM

## 2013-02-21 DIAGNOSIS — R9409 Abnormal results of other function studies of central nervous system: Secondary | ICD-10-CM

## 2013-02-21 DIAGNOSIS — R9402 Abnormal brain scan: Secondary | ICD-10-CM | POA: Diagnosis present

## 2013-02-21 DIAGNOSIS — I10 Essential (primary) hypertension: Secondary | ICD-10-CM

## 2013-02-21 LAB — GLUCOSE, CAPILLARY
Glucose-Capillary: 170 mg/dL — ABNORMAL HIGH (ref 70–99)
Glucose-Capillary: 199 mg/dL — ABNORMAL HIGH (ref 70–99)
Glucose-Capillary: 265 mg/dL — ABNORMAL HIGH (ref 70–99)
Glucose-Capillary: 166 mg/dL — ABNORMAL HIGH (ref 70–99)

## 2013-02-21 LAB — LIPID PANEL: LDL Cholesterol: 55 mg/dL (ref 0–99)

## 2013-02-21 LAB — HEMOGLOBIN A1C: Hgb A1c MFr Bld: 7.5 % — ABNORMAL HIGH (ref <5.7)

## 2013-02-21 LAB — URINE CULTURE

## 2013-02-21 MED ORDER — HYDRALAZINE HCL 20 MG/ML IJ SOLN
10.0000 mg | Freq: Once | INTRAMUSCULAR | Status: AC
  Administered 2013-02-21: 10 mg via INTRAVENOUS
  Filled 2013-02-21: qty 1

## 2013-02-21 MED ORDER — ASPIRIN EC 81 MG PO TBEC
81.0000 mg | DELAYED_RELEASE_TABLET | Freq: Every day | ORAL | Status: DC
  Administered 2013-02-21 – 2013-02-22 (×2): 81 mg via ORAL
  Filled 2013-02-21 (×2): qty 1

## 2013-02-21 MED ORDER — SIMVASTATIN 40 MG PO TABS
40.0000 mg | ORAL_TABLET | Freq: Every evening | ORAL | Status: DC
  Administered 2013-02-21 – 2013-02-23 (×3): 40 mg via ORAL
  Filled 2013-02-21 (×4): qty 1

## 2013-02-21 MED ORDER — ACETAMINOPHEN 650 MG RE SUPP: 650.0000 mg | RECTAL | Status: DC | PRN

## 2013-02-21 MED ORDER — LATANOPROST 0.005 % OP SOLN
1.0000 [drp] | Freq: Every day | OPHTHALMIC | Status: DC
  Administered 2013-02-21 – 2013-02-23 (×3): 1 [drp] via OPHTHALMIC
  Filled 2013-02-21 (×2): qty 2.5

## 2013-02-21 MED ORDER — SENNOSIDES-DOCUSATE SODIUM 8.6-50 MG PO TABS
1.0000 | ORAL_TABLET | Freq: Every evening | ORAL | Status: DC | PRN
  Administered 2013-02-24: 1 via ORAL
  Filled 2013-02-21: qty 1

## 2013-02-21 MED ORDER — ALBUTEROL SULFATE HFA 108 (90 BASE) MCG/ACT IN AERS: 2.0000 | INHALATION_SPRAY | Freq: Four times a day (QID) | RESPIRATORY_TRACT | Status: DC | PRN

## 2013-02-21 MED ORDER — CLONIDINE HCL 0.3 MG/24HR TD PTWK
0.3000 mg | MEDICATED_PATCH | TRANSDERMAL | Status: DC
  Administered 2013-02-21: 0.3 mg via TRANSDERMAL
  Filled 2013-02-21: qty 1

## 2013-02-21 MED ORDER — CITALOPRAM HYDROBROMIDE 20 MG PO TABS
20.0000 mg | ORAL_TABLET | Freq: Every day | ORAL | Status: DC
  Administered 2013-02-21 – 2013-02-24 (×4): 20 mg via ORAL
  Filled 2013-02-21 (×4): qty 1

## 2013-02-21 MED ORDER — SPIRONOLACTONE 25 MG PO TABS
25.0000 mg | ORAL_TABLET | Freq: Every day | ORAL | Status: DC
  Administered 2013-02-21 – 2013-02-24 (×4): 25 mg via ORAL
  Filled 2013-02-21 (×4): qty 1

## 2013-02-21 MED ORDER — SODIUM CHLORIDE 0.9 % IV SOLN
INTRAVENOUS | Status: DC
  Administered 2013-02-21: 05:00:00 via INTRAVENOUS

## 2013-02-21 MED ORDER — INSULIN ASPART 100 UNIT/ML ~~LOC~~ SOLN
0.0000 [IU] | Freq: Three times a day (TID) | SUBCUTANEOUS | Status: DC
  Administered 2013-02-21: 5 [IU] via SUBCUTANEOUS
  Administered 2013-02-21 (×2): 2 [IU] via SUBCUTANEOUS
  Administered 2013-02-22 (×2): 3 [IU] via SUBCUTANEOUS
  Administered 2013-02-22: 2 [IU] via SUBCUTANEOUS
  Administered 2013-02-23 (×2): 3 [IU] via SUBCUTANEOUS
  Administered 2013-02-23: 2 [IU] via SUBCUTANEOUS
  Administered 2013-02-24 (×2): 3 [IU] via SUBCUTANEOUS

## 2013-02-21 MED ORDER — ACETAMINOPHEN 325 MG PO TABS
650.0000 mg | ORAL_TABLET | ORAL | Status: DC | PRN
  Administered 2013-02-22 – 2013-02-24 (×4): 650 mg via ORAL
  Filled 2013-02-21 (×4): qty 2

## 2013-02-21 MED ORDER — POLYVINYL ALCOHOL 1.4 % OP SOLN: 1.0000 [drp] | Freq: Every day | OPHTHALMIC | Status: DC | PRN

## 2013-02-21 MED ORDER — CARVEDILOL 25 MG PO TABS
25.0000 mg | ORAL_TABLET | Freq: Two times a day (BID) | ORAL | Status: DC
  Administered 2013-02-21 – 2013-02-24 (×7): 25 mg via ORAL
  Filled 2013-02-21 (×9): qty 1

## 2013-02-21 MED ORDER — INSULIN ASPART 100 UNIT/ML ~~LOC~~ SOLN
0.0000 [IU] | Freq: Every day | SUBCUTANEOUS | Status: DC
  Administered 2013-02-22 – 2013-02-23 (×2): 2 [IU] via SUBCUTANEOUS

## 2013-02-21 NOTE — Progress Notes (Signed)
History:  Cody Silva is an 67 y.o. male with a history of hypertension, hyperlipidemia and diabetes mellitus, diabetic neuropathy and diabetic retinopathy, who came to the emergency room following a recurrent spell of acute weakness with dizziness tendency to collapse toward the right side but with generalized weakness. Patient has been experiencing spells of this type for several months. There is associated slurred speech as well. Patient is aware of his surroundings and able to communicate with those around him coherently. No convulsive type movements have been described. An MRI of his brain was obtained which showed an area of abnormal signal involving the body of the corpus callosum as well as cingulate gyrus, right greater than left. It's unclear whether this lesion was vascular in nature or possibly neoplastic. Decreased flow in the median artery at the corpus callosum was noted on MRA. Significance is unclear. There was minimal, if any contrast-enhancement.  Subjective:   Patient feels same-weak all over. Says he has transient left, then right sided weakness. Has been intermittent yet persistent over past few months.  Objective: BP 175/93  Pulse 78  Temp(Src) 98 F (36.7 C) (Oral)  Resp 16  Ht 6\' 4"  (1.93 m)  Wt 106.4 kg (234 lb 9.1 oz)  BMI 28.56 kg/m2  SpO2 100%  CBGs  Recent Labs  02/21/13 0616  GLUCAP 170*   Past Medical History  Diagnosis Date  . Hypertension   . Diabetes mellitus without complication   . Diabetic retinopathy   . Diabetic neuropathy      Medications: Scheduled: . aspirin EC  81 mg Oral Daily  . carvedilol  25 mg Oral BID WC  . citalopram  20 mg Oral Daily  . cloNIDine  0.3 mg Transdermal Weekly  . insulin aspart  0-5 Units Subcutaneous QHS  . insulin aspart  0-9 Units Subcutaneous TID WC  . latanoprost  1 drop Left Eye QHS  . simvastatin  40 mg Oral QPM  . spironolactone  25 mg Oral Daily    Neurologic Exam: Mental Status: Alert,  oriented, thought content appropriate.  Speech fluent without evidence of aphasia. Able to follow 3 step commands without difficulty. Cranial Nerves: II- Visual fields grossly intact. III/IV/VI-Extraocular movements intact.  Pupils reactive bilaterally. V/VII-Smile symmetric, no facial weakness VIII-hearing grossly intact IX/X-normal gag XI-bilateral shoulder shrug XII-midline tongue extension Motor: 4/5 weakness hand grip on left. 4/5 left foot dorsiflexion. 5/5 right. Sensory: Light touch intact throughout, bilaterally Deep Tendon Reflexes: 2+ and symmetric throughout Plantars: equivocal Cerebellar: right arm tremor   Lab Results: CBC:  Recent Labs Lab 02/20/13 1702  WBC 5.8  NEUTROABS 4.1  HGB 12.9*  HCT 35.8*  MCV 84.2  PLT [id number]   Basic Metabolic Panel:  Recent Labs Lab 02/20/13 1702  NA 135  K 3.6  CL 96  CO2 30  GLUCOSE 201*  BUN 24*  CREATININE 1.52*  CALCIUM 9.3   Liver Function Tests: No results found for this basename: AST, ALT, ALKPHOS, BILITOT, PROT, ALBUMIN,  in the last 168 hours Hemoglobin A1C: No results found for this basename: HGBA1C,  in the last 168 hours Fasting Lipid Panel:  Recent Labs Lab 02/21/13 0520  CHOL 134  HDL 61  LDLCALC 55  TRIG 90  CHOLHDL 2.2   Thyroid Function Tests: No results found for this basename: TSH, T4TOTAL, FREET4, T3FREE, THYROIDAB,  in the last 168 hours Coagulation: No results found for this basename: LABPROT, INR,  in the last 168 hours   Study Results:  Mr Virgel Paling Wo Contrast  02/20/2013   *RADIOLOGY REPORT*  Clinical Data:  67 year old male with right side facial droop and slurred speech.  Difficulty with balance.  Right side weakness.  Contrast: 44mL MULTIHANCE GADOBENATE DIMEGLUMINE 529 MG/ML IV SOLN  Comparison: None.  MRI HEAD WITHOUT AND WITH CONTRAST  Technique: Multiplanar, multiecho pulse sequences of the brain and surrounding structures were obtained according to standard protocol without and  with intravenous contrast.  Findings:  There are sharply demarcated areas of restricted diffusion in the body of the corpus callosum and cingulate gyri, more so the right.  These are associated with T2 and FLAIR hyperintensity plus expansion of the corpus callosum.  Postcontrast images show mild if any enhancement along the periphery of the abnormal signal.  The area of abnormality encompasses 44 x 26 x 13 mm (AP by transverse by CC).  No definite hemispheric white matter involvement.  No associated hemorrhage.  No other diffusion abnormality. Major intracranial vascular flow voids are preserved, with a degree of intracranial artery dolichoectasia.  See MRA findings below.  Superimposed mild for age scattered periventricular and subcortical cerebral white matter T2 and FLAIR hyperintensity elsewhere.  No cortical encephalomalacia.  Deep gray matter nuclei, brainstem, cerebellum, pituitary, cervicomedullary junction and visualized cervical spine are within normal limits.  Visualized orbit soft tissues are within normal limits.  Visualized paranasal sinuses and mastoids are clear.  Grossly normal visualized internal auditory structures. Visualized bone marrow signal is within normal limits.  Negative scalp soft tissues.  IMPRESSION: 1.  Abnormal mass-like signal with restricted diffusion in the body of the corpus callosum, and also involving the cingulate gyri more so the right.  Minimal if any associated enhancement.  No associated hemorrhage or significant mass effect on adjacent structures. 2.  Top differential considerations for #1 include acute ACA / median artery corpus callosum infarcts (favored, see neck MRA findings below) versus a primary brain tumor simulating a stroke (moderate to high-grade glioma). 3.  In light of #2, recommend imaging surveillance (e.g. 6 - 8 weeks) to confirm expected post ischemic evolution. 4.  Otherwise mild for age nonspecific white matter signal changes.  Study discussed by  telephone with Dr. Daleen Bo on 02/20/2013 at 2330 hours.  MRA HEAD WITHOUT CONTRAST  Technique: Angiographic images of the Circle of [place] were obtained using MRA technique without  intravenous contrast.  Findings:  Degraded by a combination of MOTSA and motion artifact.  Antegrade flow in the posterior circulation with relatively codominant distal vertebral arteries.  Mild to moderate distal vertebral artery irregularity, greater on the left.  Moderate stenosis of the left V4 segment proximal to the left PICA origin. Right PICA origin is patent.  Vertebrobasilar junction is patent, but the basilar artery is irregular with a moderate to severe mid basilar stenosis.  The basilar tip is patent.  SCA origins are patent.  Fetal type bilateral PCA origins, more so the left, with evidence of superimposed P1 segment atherosclerosis.  Bilateral PCA branches are within normal limits.  Antegrade flow in both ICA siphons with ICA dolichoectasia, mild irregularity, and no stenosis.  Ophthalmic and posterior communicating artery origins are within normal limits.  Carotid termini are patent, but the left A1 segment is dominant. The anterior communicating artery is present.  There is a median artery of the corpus callosum which it is difficult to visualize much beyond its origin.  The other ACA branches have more normal appearance.  Both MCA origins are within normal limits.  Bilateral MCA branches are within normal limits.  IMPRESSION: 1.  Intracranial MRA degraded by a combination of MOTSA and motion artifact. 2.  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 3.  However, see the neck MRA findings below, which also included these portions of the intracranial circulation. 4.  Dolichoectasia of the ICA siphons.  Dominant left ACA A1 segment.  MRA NECK WITHOUT AND WITH CONTRAST  Technique:  Angiographic images of the neck were obtained using MRA  technique without and with intravenous contrast.  Carotid stenosis measurements (when applicable) are obtained utilizing NASCET criteria, using the distal internal carotid diameter as the denominator.  Findings:  Precontrast time-of-flight images of the neck demonstrate antegrade flow in both cervical carotid and vertebral arteries. Normal time-of-flight appearance of the carotid bifurcations.  Antegrade flow continues to the skull base.  Postcontrast images suggest a bovine arch configuration.  Great vessel origins are patent without significant stenosis.  The right common carotid artery is mildly tortuous.  There is mild to moderate irregularity at the posterior right ICA origin compatible with atherosclerotic plaque.  However, no hemodynamically significant cervical ICA stenosis occurs.  The visible right ICA siphon is mildly [place]:  The visible right ICA siphon is mildly dolichoectatic is seen on the intracranial exam. The right ACA A1 segment is diminutive.  The left common carotid artery is normal.  The left carotid bifurcation is normal.  The cervical left ICA is normal aside from mild tortuosity.  The left ICA siphon and dolichoectatic as seen on the intracranial study.  The left ICA terminus is patent along with the MCA and ACA origins.  The proximal ACA branches appear normal. There is a median artery of the corpus callosum which demonstrates irregularity and a 6 mm flow gap at the level of the body of the corpus callosum.  There does appear to be preserved distal flow.  No hemodynamically significant proximal subclavian artery stenosis is identified.  Both vertebral artery origins are within normal limits.  Proximal left vertebral arteries tortuous.  The cervical vertebral arteries are relatively codominant and within normal limits throughout the neck.  Intracranial vertebral arteries are mildly irregular, more so the left.  There does not appear to be hemodynamically significant stenosis at the distal  left vertebral artery or vertebrobasilar junction.  There is irregularity of the mid basilar artery, the these images suggest at most moderate stenosis.  IMPRESSION: 1.  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  This supports the ischemic etiology very of the MRI abnormality. 2. The MRA images do not suggest a hemodynamically significant basilar artery stenosis.  Also, the distal vertebral arteries appear more normal than they did on the intracranial portion. 3.  Mild atherosclerosis at the right ICA origin without hemodynamically significant stenosis. 4.  Otherwise negative neck MRA.   Original Report Authenticated By: Roselyn Reef, M.D.   Mr Angiogram Neck W Wo Contrast  02/20/2013   *RADIOLOGY REPORT*  Clinical Data:  67 year old male with right side facial droop and slurred speech.  Difficulty with balance.  Right side weakness.  Contrast: 30mL MULTIHANCE GADOBENATE DIMEGLUMINE 529 MG/ML IV SOLN  Comparison: None.  MRI HEAD WITHOUT AND WITH CONTRAST  Technique: Multiplanar, multiecho pulse sequences of the brain and surrounding structures were obtained according to standard protocol without and with intravenous contrast.  Findings:  There are sharply demarcated areas of restricted diffusion in the body of the corpus callosum and cingulate gyri, more so the right.  These are associated with T2 and FLAIR hyperintensity plus expansion of the corpus callosum.  Postcontrast images show mild if any enhancement along the periphery of the abnormal signal.  The area of abnormality encompasses 44 x 26 x 13 mm (AP by transverse by CC).  No definite hemispheric white matter involvement.  No associated hemorrhage.  No other diffusion abnormality. Major intracranial vascular flow voids are preserved, with a degree of intracranial artery dolichoectasia.  See MRA findings below.  Superimposed mild for age scattered  periventricular and subcortical cerebral white matter T2 and FLAIR hyperintensity elsewhere.  No cortical encephalomalacia.  Deep gray matter nuclei, brainstem, cerebellum, pituitary, cervicomedullary junction and visualized cervical spine are within normal limits.  Visualized orbit soft tissues are within normal limits.  Visualized paranasal sinuses and mastoids are clear.  Grossly normal visualized internal auditory structures. Visualized bone marrow signal is within normal limits.  Negative scalp soft tissues.  IMPRESSION: 1.  Abnormal mass-like signal with restricted diffusion in the body of the corpus callosum, and also involving the cingulate gyri more so the right.  Minimal if any associated enhancement.  No associated hemorrhage or significant mass effect on adjacent structures. 2.  Top differential considerations for #1 include acute ACA / median artery corpus callosum infarcts (favored, see neck MRA findings below) versus a primary brain tumor simulating a stroke (moderate to high-grade glioma). 3.  In light of #2, recommend imaging surveillance (e.g. 6 - 8 weeks) to confirm expected post ischemic evolution. 4.  Otherwise mild for age nonspecific white matter signal changes.  Study discussed by telephone with Dr. Daleen Bo on 02/20/2013 at 2330 hours.  MRA HEAD WITHOUT CONTRAST  Technique: Angiographic images of the Circle of [place] were obtained using MRA technique without  intravenous contrast.  Findings:  Degraded by a combination of MOTSA and motion artifact.  Antegrade flow in the posterior circulation with relatively codominant distal vertebral arteries.  Mild to moderate distal vertebral artery irregularity, greater on the left.  Moderate stenosis of the left V4 segment proximal to the left PICA origin. Right PICA origin is patent.  Vertebrobasilar junction is patent, but the basilar artery is irregular with a moderate to severe mid basilar stenosis.  The basilar tip is patent.  SCA origins are  patent.  Fetal type bilateral PCA origins, more so the left, with evidence of superimposed P1 segment atherosclerosis.  Bilateral PCA branches are within normal limits.  Antegrade flow in both ICA siphons with ICA dolichoectasia, mild irregularity, and no stenosis.  Ophthalmic and posterior communicating artery origins are within normal limits.  Carotid termini are patent, but the left A1 segment is dominant. The anterior communicating artery is present.  There is a median artery of the corpus callosum which it is difficult to visualize much beyond its origin.  The other ACA branches have more normal appearance.  Both MCA origins are within normal limits.  Bilateral MCA branches are within normal limits.  IMPRESSION: 1.  Intracranial MRA degraded by a combination of MOTSA and motion artifact. 2.  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 3.  However, see the neck MRA findings below, which also included these portions of the intracranial circulation. 4.  Dolichoectasia of the ICA siphons.  Dominant left ACA A1 segment.  MRA NECK  WITHOUT AND WITH CONTRAST  Technique:  Angiographic images of the neck were obtained using MRA technique without and with intravenous contrast.  Carotid stenosis measurements (when applicable) are obtained utilizing NASCET criteria, using the distal internal carotid diameter as the denominator.  Findings:  Precontrast time-of-flight images of the neck demonstrate antegrade flow in both cervical carotid and vertebral arteries. Normal time-of-flight appearance of the carotid bifurcations.  Antegrade flow continues to the skull base.  Postcontrast images suggest a bovine arch configuration.  Great vessel origins are patent without significant stenosis.  The right common carotid artery is mildly tortuous.  There is mild to moderate irregularity at the posterior right ICA origin compatible with atherosclerotic  plaque.  However, no hemodynamically significant cervical ICA stenosis occurs.  The visible right ICA siphon is mildly [place]:  The visible right ICA siphon is mildly dolichoectatic is seen on the intracranial exam. The right ACA A1 segment is diminutive.  The left common carotid artery is normal.  The left carotid bifurcation is normal.  The cervical left ICA is normal aside from mild tortuosity.  The left ICA siphon and dolichoectatic as seen on the intracranial study.  The left ICA terminus is patent along with the MCA and ACA origins.  The proximal ACA branches appear normal. There is a median artery of the corpus callosum which demonstrates irregularity and a 6 mm flow gap at the level of the body of the corpus callosum.  There does appear to be preserved distal flow.  No hemodynamically significant proximal subclavian artery stenosis is identified.  Both vertebral artery origins are within normal limits.  Proximal left vertebral arteries tortuous.  The cervical vertebral arteries are relatively codominant and within normal limits throughout the neck.  Intracranial vertebral arteries are mildly irregular, more so the left.  There does not appear to be hemodynamically significant stenosis at the distal left vertebral artery or vertebrobasilar junction.  There is irregularity of the mid basilar artery, the these images suggest at most moderate stenosis.  IMPRESSION: 1.  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  This supports the ischemic etiology very of the MRI abnormality. 2. The MRA images do not suggest a hemodynamically significant basilar artery stenosis.  Also, the distal vertebral arteries appear more normal than they did on the intracranial portion. 3.  Mild atherosclerosis at the right ICA origin without hemodynamically significant stenosis. 4.  Otherwise negative neck MRA.   Original Report Authenticated  By: Roselyn Reef, M.D.    Assessment  67yo male with progressive symptoms of dizziness, generalized weakness, listing to right side over the past few months. MRI abnormal with concern for mass lesion vs ischemic event of corpus callosum and cingulate gyrus. Continue stroke workup   Hypertension  Diabetes mellitus without complication  Diabetic retinopathy  Diabetic neuropathy  Long term medication use  LDL 55 at goal  Carotid doppler is unremarkable: Vascular Ultrasound  Carotid Duplex (Doppler) has been completed. Preliminary findings: Bilateral: Less than 39% ICA stenosis. Vertebral artery flow is antegrade  Plan   2d Echo/EEG/ PET pending  hgb a1c pending  Start therapy treatments  Risk factor modification  PT, OT following   Joesphine Bare, PAC,  MBA, [facility] Pager: [phone] 02/21/2013 8:20 AM  I have personally obtained a history, examined the patient, evaluated imaging results, and formulated the assessment and plan of care. I agree  with the above.  Lenor Coffin

## 2013-02-21 NOTE — Procedures (Signed)
ELECTROENCEPHALOGRAM REPORT   Patient: Cody Silva       Room #: [id number] EEG No. ID: [id number] Age: 67 y.o.        Sex: male Referring Physician: Jannifer Franklin Report Date:  02/21/2013        Interpreting Physician: Alexis Goodell D  History: Cody Silva is an 67 y.o. male with acute infarct.  Medications:  Scheduled: . aspirin EC  81 mg Oral Daily  . carvedilol  25 mg Oral BID WC  . citalopram  20 mg Oral Daily  . cloNIDine  0.3 mg Transdermal Weekly  . insulin aspart  0-5 Units Subcutaneous QHS  . insulin aspart  0-9 Units Subcutaneous TID WC  . latanoprost  1 drop Left Eye QHS  . simvastatin  40 mg Oral QPM  . spironolactone  25 mg Oral Daily    Conditions of Recording:  This is a 16 channel EEG carried out with the patient in the awake, drowsy and asleep states.  Description:  The waking background activity consists of a low voltage, symmetrical, fairly well organized, 10 Hz alpha activity, seen from the parieto-occipital and posterior temporal regions.  Low voltage fast activity, poorly organized, is seen anteriorly and is at times superimposed on more posterior regions.  A mixture of theta and alpha rhythms are seen from the central and temporal regions. The patient drowses with slowing to irregular, low voltage theta and beta activity.   The patient goes in to a light sleep with symmetrical sleep spindles, vertex central sharp transients and irregular slow activity.  Hyperventilation was not performed.  Intermittent photic stimulation was performed but failed to illicit any change in the tracing.   IMPRESSION: This is a normal EEG.   Alexis Goodell, MD [facility] [phone] 02/21/2013, 6:48 PM

## 2013-02-21 NOTE — Progress Notes (Signed)
Patient seen and examined. Admitted earlier today for dizziness, falls and unbalance as well as slurred speech. MRI shows ischemia vs mass at the corpus callosum and cingulate gyrus. MRA shows decreased flow in the median artery at the corpus callosum that makes ischemia more likely. For now will continue with stroke work up. Await neurology recommendations to see if any further imaging is required. Will continue to follow.  Domingo Mend, MD [facility] Pager: [phone]

## 2013-02-21 NOTE — Progress Notes (Signed)
Echo Lab  2D Echocardiogram completed.  Cody Silva L Denessa Cavan, RDCS 02/21/2013 10:30 AM

## 2013-02-21 NOTE — Consult Note (Signed)
NEURO HOSPITALIST CONSULT NOTE    Reason for Consult: Recurrent spells of dizziness and weakness with falling to the right side.  HPI:                                                                                                                                          Cody Silva is an 67 y.o. male with a history of hypertension, hyperlipidemia and diabetes mellitus, diabetic neuropathy and diabetic retinopathy, who came to the emergency room following a recurrent spell of acute weakness with dizziness tendency to collapse toward the right side but with generalized weakness. Patient has been experiencing spells of this type for several months. There is associated slurred speech as well. Patient is aware of his surroundings and able to communicate with those around him coherently. No convulsive type movements have been described. An MRI of his brain was obtained which showed an area of abnormal signal involving the body of the corpus callosum as well as cingulate gyrus, right greater than left. It's unclear whether this lesion was vascular in nature or possibly neoplastic. Decreased flow in the median artery at the corpus callosum was noted on MRA. Significance is unclear. There was minimal, if any contrast-enhancement.  Past Medical History  Diagnosis Date  . Hypertension   . Diabetes mellitus without complication   . Diabetic retinopathy   . Diabetic neuropathy     Past Surgical History  Procedure Laterality Date  . Eye surgery    . Arm surgery      Tumor removed     No family history on file.    Social History:  reports that he has never smoked. He does not have any smokeless tobacco history on file. He reports that he does not drink alcohol or use illicit drugs.  No Known Allergies  MEDICATIONS:                                                                                                                     Prior to admission:  albuterol (PROVENTIL  HFA;VENTOLIN HFA) 108 (90 BASE) MCG/ACT inhaler   Inhalation   Inhale 2 puffs into the lungs every 6 (six) hours as needed for wheezing.       [name]  aspirin EC 81 MG tablet  Oral   Take 81 mg by mouth daily.       .  Aspirin-Salicylamide-Caffeine (BC HEADACHE POWDER PO)   Oral   Take 1 packet by mouth daily as needed (for headache).       .  carvedilol (COREG) 25 MG tablet   Oral   Take 25 mg by mouth 2 (two) times daily with a meal.       .  citalopram (CELEXA) 40 MG tablet   Oral   Take 20 mg by mouth daily.       .  cloNIDine (CATAPRES - DOSED IN MG/24 HR) 0.3 mg/24hr   Transdermal   Place 1 patch onto the skin once a week.       .  fluocinonide cream (LIDEX) 0.05 %   Topical   Apply 1 application topically 2 (two) times daily.       .  fluticasone (FLONASE) 50 MCG/ACT nasal spray   Nasal   Place 2 sprays into the nose daily.       [name]  latanoprost (XALATAN) 0.005 % ophthalmic solution   Left Eye   Place 1 drop into the left eye at bedtime.       [name]  losartan (COZAAR) 100 MG tablet   Oral   Take 100 mg by mouth daily.       .  Multiple Vitamins-Minerals (MULTIVITAMIN PO)   Oral   Take 1 tablet by mouth daily.       .  polyvinyl alcohol (LIQUIFILM TEARS) 1.4 % ophthalmic solution   Both Eyes   Place 1 drop into both eyes 5 (five) times daily as needed (for dry eyes).       .  simvastatin (ZOCOR) 40 MG tablet   Oral   Take 40 mg by mouth every evening.       .  Skin Protectants, Misc. (EUCERIN) cream   Topical   Apply 1 application topically as needed (for dry feet).       [name]  spironolactone (ALDACTONE) 25 MG tablet   Oral   Take 25 mg by mouth daily.             ROS:                                                                                                                                       History obtained from spouse and the patient  General ROS: negative for - chills, fatigue, fever, night sweats, weight gain or weight loss Psychological ROS: negative for - behavioral disorder,  hallucinations, memory difficulties, mood swings or suicidal ideation Ophthalmic ROS: negative for - blurry vision, double vision, eye pain or loss of vision ENT ROS: negative for - epistaxis, nasal discharge, oral lesions, sore throat, tinnitus or vertigo Allergy and Immunology ROS: negative for - hives or itchy/watery eyes Hematological and Lymphatic ROS: negative for - bleeding problems, bruising  or swollen lymph nodes Endocrine ROS: negative for - galactorrhea, hair pattern changes, polydipsia/polyuria or temperature intolerance Respiratory ROS: negative for - cough, hemoptysis, shortness of breath or wheezing Cardiovascular ROS: negative for - chest pain, dyspnea on exertion, edema or irregular heartbeat Gastrointestinal ROS: negative for - abdominal pain, diarrhea, hematemesis, nausea/vomiting or stool incontinence Genito-Urinary ROS: negative for - dysuria, hematuria, incontinence or urinary frequency/urgency Musculoskeletal ROS: negative for - joint swelling or muscular weakness Neurological ROS: as noted in HPI Dermatological ROS: negative for rash and skin lesion changes   Blood pressure 163/93, pulse 70, temperature 98.1 F (36.7 C), temperature source Oral, resp. rate 17, SpO2 96.00%.   Neurologic Examination:                                                                                                      Mental Status: Alert, oriented, thought content appropriate.  Speech fluent without evidence of aphasia. Able to follow commands without difficulty. Cranial Nerves: II-Visual fields were normal. III/IV/VI-Pupils were equal and reacted. Extraocular movements were full and conjugate.    V/VII-no facial numbness and no facial weakness. VIII-normal. X-equivocal dysarthria; symmetrical palatal movement. Motor: Right pronator drift; mild weakness of right biceps and triceps as well as slightly reduced right hand grip compared to the left; no weakness of left upper extremity;  mild weakness of hip flexors, quadriceps and hamstrings of left lower extremity; normal strength distally of left lower extremity; normal strength of right lower extremity proximally and distally. No atrophy noted. Sensory: Normal throughout. Deep Tendon Reflexes: 2+ and brisk, and symmetric. Plantars: Mute bilaterally Cerebellar: Minimal intention tremor right upper extremity with finger to nose testing; normal left. Carotid auscultation: Normal  No results found for this basename: cbc, bmp, coags, chol, tri, ldl, hga1c    Results for orders placed during the hospital encounter of 02/20/13 (from the past 48 hour(s))  CBC WITH DIFFERENTIAL     Status: Abnormal   Collection Time    02/20/13  5:02 PM      Result Value Range   WBC 5.8  4.0 - 10.5 K/uL   RBC 4.25  4.22 - 5.81 MIL/uL   Hemoglobin 12.9 (*) 13.0 - 17.0 g/dL   HCT 35.8 (*) 39.0 - 52.0 %   MCV 84.2  78.0 - 100.0 fL   MCH 30.4  26.0 - 34.0 pg   MCHC 36.0  30.0 - 36.0 g/dL   RDW 13.0  11.5 - 15.5 %   Platelets 161  150 - 400 K/uL   Neutrophils Relative % 71  43 - 77 %   Neutro Abs 4.1  1.7 - 7.7 K/uL   Lymphocytes Relative 20  12 - 46 %   Lymphs Abs 1.1  0.7 - 4.0 K/uL   Monocytes Relative 6  3 - 12 %   Monocytes Absolute 0.3  0.1 - 1.0 K/uL   Eosinophils Relative 4  0 - 5 %   Eosinophils Absolute 0.2  0.0 - 0.7 K/uL   Basophils Relative 1  0 - 1 %   Basophils Absolute 0.0  0.0 - 0.1 K/uL  BASIC METABOLIC PANEL     Status: Abnormal   Collection Time    02/20/13  5:02 PM      Result Value Range   Sodium 135  135 - 145 mEq/L   Potassium 3.6  3.5 - 5.1 mEq/L   Chloride 96  96 - 112 mEq/L   CO2 30  19 - 32 mEq/L   Glucose, Bld 201 (*) 70 - 99 mg/dL   BUN 24 (*) 6 - 23 mg/dL   Creatinine, Ser 1.52 (*) 0.50 - 1.35 mg/dL   Calcium 9.3  8.4 - 10.5 mg/dL   GFR calc non Af Amer 46 (*) >90 mL/min   GFR calc Af Amer 54 (*) >90 mL/min   Comment:            The eGFR has been calculated     using the CKD EPI equation.      This calculation has not been     validated in all clinical     situations.     eGFR's persistently     <90 mL/min signify     possible Chronic Kidney Disease.  URINALYSIS, ROUTINE W REFLEX MICROSCOPIC     Status: Abnormal   Collection Time    02/20/13  7:20 PM      Result Value Range   Color, Urine YELLOW  YELLOW   APPearance CLEAR  CLEAR   Specific Gravity, Urine 1.020  1.005 - 1.030   pH 6.0  5.0 - 8.0   Glucose, UA 100 (*) NEGATIVE mg/dL   Hgb urine dipstick NEGATIVE  NEGATIVE   Bilirubin Urine NEGATIVE  NEGATIVE   Ketones, ur NEGATIVE  NEGATIVE mg/dL   Protein, ur 30 (*) NEGATIVE mg/dL   Urobilinogen, UA 1.0  0.0 - 1.0 mg/dL   Nitrite NEGATIVE  NEGATIVE   Leukocytes, UA NEGATIVE  NEGATIVE  URINE MICROSCOPIC-ADD ON     Status: None   Collection Time    02/20/13  7:20 PM      Result Value Range   Squamous Epithelial / LPF RARE  RARE   RBC / HPF 0-2  <3 RBC/hpf    Mr Virgel Paling Wo Contrast  02/20/2013   *RADIOLOGY REPORT*  Clinical Data:  67 year old male with right side facial droop and slurred speech.  Difficulty with balance.  Right side weakness.  Contrast: 33mL MULTIHANCE GADOBENATE DIMEGLUMINE 529 MG/ML IV SOLN  Comparison: None.  MRI HEAD WITHOUT AND WITH CONTRAST  Technique: Multiplanar, multiecho pulse sequences of the brain and surrounding structures were obtained according to standard protocol without and with intravenous contrast.  Findings:  There are sharply demarcated areas of restricted diffusion in the body of the corpus callosum and cingulate gyri, more so the right.  These are associated with T2 and FLAIR hyperintensity plus expansion of the corpus callosum.  Postcontrast images show mild if any enhancement along the periphery of the abnormal signal.  The area of abnormality encompasses 44 x 26 x 13 mm (AP by transverse by CC).  No definite hemispheric white matter involvement.  No associated hemorrhage.  No other diffusion abnormality. Major intracranial vascular  flow voids are preserved, with a degree of intracranial artery dolichoectasia.  See MRA findings below.  Superimposed mild for age scattered periventricular and subcortical cerebral white matter T2 and FLAIR hyperintensity elsewhere.  No cortical encephalomalacia.  Deep gray matter nuclei, brainstem, cerebellum, pituitary, cervicomedullary junction and visualized cervical spine are within normal limits.  Visualized orbit  soft tissues are within normal limits.  Visualized paranasal sinuses and mastoids are clear.  Grossly normal visualized internal auditory structures. Visualized bone marrow signal is within normal limits.  Negative scalp soft tissues.  IMPRESSION: 1.  Abnormal mass-like signal with restricted diffusion in the body of the corpus callosum, and also involving the cingulate gyri more so the right.  Minimal if any associated enhancement.  No associated hemorrhage or significant mass effect on adjacent structures. 2.  Top differential considerations for #1 include acute ACA / median artery corpus callosum infarcts (favored, see neck MRA findings below) versus a primary brain tumor simulating a stroke (moderate to high-grade glioma). 3.  In light of #2, recommend imaging surveillance (e.g. 6 - 8 weeks) to confirm expected post ischemic evolution. 4.  Otherwise mild for age nonspecific white matter signal changes.  Study discussed by telephone with Dr. Daleen Bo on 02/20/2013 at 2330 hours.  MRA HEAD WITHOUT CONTRAST  Technique: Angiographic images of the Circle of Willis were obtained using MRA technique without  intravenous contrast.  Findings:  Degraded by a combination of MOTSA and motion artifact.  Antegrade flow in the posterior circulation with relatively codominant distal vertebral arteries.  Mild to moderate distal vertebral artery irregularity, greater on the left.  Moderate stenosis of the left V4 segment proximal to the left PICA origin. Right PICA origin is patent.  Vertebrobasilar junction  is patent, but the basilar artery is irregular with a moderate to severe mid basilar stenosis.  The basilar tip is patent.  SCA origins are patent.  Fetal type bilateral PCA origins, more so the left, with evidence of superimposed P1 segment atherosclerosis.  Bilateral PCA branches are within normal limits.  Antegrade flow in both ICA siphons with ICA dolichoectasia, mild irregularity, and no stenosis.  Ophthalmic and posterior communicating artery origins are within normal limits.  Carotid termini are patent, but the left A1 segment is dominant. The anterior communicating artery is present.  There is a median artery of the corpus callosum which it is difficult to visualize much beyond its origin.  The other ACA branches have more normal appearance.  Both MCA origins are within normal limits.  Bilateral MCA branches are within normal limits.  IMPRESSION: 1.  Intracranial MRA degraded by a combination of MOTSA and motion artifact. 2.  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 3.  However, see the neck MRA findings below, which also included these portions of the intracranial circulation. 4.  Dolichoectasia of the ICA siphons.  Dominant left ACA A1 segment.  MRA NECK WITHOUT AND WITH CONTRAST  Technique:  Angiographic images of the neck were obtained using MRA technique without and with intravenous contrast.  Carotid stenosis measurements (when applicable) are obtained utilizing NASCET criteria, using the distal internal carotid diameter as the denominator.  Findings:  Precontrast time-of-flight images of the neck demonstrate antegrade flow in both cervical carotid and vertebral arteries. Normal time-of-flight appearance of the carotid bifurcations.  Antegrade flow continues to the skull base.  Postcontrast images suggest a bovine arch configuration.  Great vessel origins are patent without significant stenosis.  The right common  carotid artery is mildly tortuous.  There is mild to moderate irregularity at the posterior right ICA origin compatible with atherosclerotic plaque.  However, no hemodynamically significant cervical ICA stenosis occurs.  The visible right ICA siphon is mildly [place]:  The visible right ICA siphon is mildly dolichoectatic is seen  on the intracranial exam. The right ACA A1 segment is diminutive.  The left common carotid artery is normal.  The left carotid bifurcation is normal.  The cervical left ICA is normal aside from mild tortuosity.  The left ICA siphon and dolichoectatic as seen on the intracranial study.  The left ICA terminus is patent along with the MCA and ACA origins.  The proximal ACA branches appear normal. There is a median artery of the corpus callosum which demonstrates irregularity and a 6 mm flow gap at the level of the body of the corpus callosum.  There does appear to be preserved distal flow.  No hemodynamically significant proximal subclavian artery stenosis is identified.  Both vertebral artery origins are within normal limits.  Proximal left vertebral arteries tortuous.  The cervical vertebral arteries are relatively codominant and within normal limits throughout the neck.  Intracranial vertebral arteries are mildly irregular, more so the left.  There does not appear to be hemodynamically significant stenosis at the distal left vertebral artery or vertebrobasilar junction.  There is irregularity of the mid basilar artery, the these images suggest at most moderate stenosis.  IMPRESSION: 1.  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  This supports the ischemic etiology very of the MRI abnormality. 2. The MRA images do not suggest a hemodynamically significant basilar artery stenosis.  Also, the distal vertebral arteries appear more normal than they did on the intracranial portion. 3.  Mild  atherosclerosis at the right ICA origin without hemodynamically significant stenosis. 4.  Otherwise negative neck MRA.   Original Report Authenticated By: Roselyn Reef, M.D.   Mr Angiogram Neck W Wo Contrast  02/20/2013   *RADIOLOGY REPORT*  Clinical Data:  67 year old male with right side facial droop and slurred speech.  Difficulty with balance.  Right side weakness.  Contrast: 54mL MULTIHANCE GADOBENATE DIMEGLUMINE 529 MG/ML IV SOLN  Comparison: None.  MRI HEAD WITHOUT AND WITH CONTRAST  Technique: Multiplanar, multiecho pulse sequences of the brain and surrounding structures were obtained according to standard protocol without and with intravenous contrast.  Findings:  There are sharply demarcated areas of restricted diffusion in the body of the corpus callosum and cingulate gyri, more so the right.  These are associated with T2 and FLAIR hyperintensity plus expansion of the corpus callosum.  Postcontrast images show mild if any enhancement along the periphery of the abnormal signal.  The area of abnormality encompasses 44 x 26 x 13 mm (AP by transverse by CC).  No definite hemispheric white matter involvement.  No associated hemorrhage.  No other diffusion abnormality. Major intracranial vascular flow voids are preserved, with a degree of intracranial artery dolichoectasia.  See MRA findings below.  Superimposed mild for age scattered periventricular and subcortical cerebral white matter T2 and FLAIR hyperintensity elsewhere.  No cortical encephalomalacia.  Deep gray matter nuclei, brainstem, cerebellum, pituitary, cervicomedullary junction and visualized cervical spine are within normal limits.  Visualized orbit soft tissues are within normal limits.  Visualized paranasal sinuses and mastoids are clear.  Grossly normal visualized internal auditory structures. Visualized bone marrow signal is within normal limits.  Negative scalp soft tissues.  IMPRESSION: 1.  Abnormal mass-like signal with restricted  diffusion in the body of the corpus callosum, and also involving the cingulate gyri more so the right.  Minimal if any associated enhancement.  No associated hemorrhage or significant mass effect on adjacent  structures. 2.  Top differential considerations for #1 include acute ACA / median artery corpus callosum infarcts (favored, see neck MRA findings below) versus a primary brain tumor simulating a stroke (moderate to high-grade glioma). 3.  In light of #2, recommend imaging surveillance (e.g. 6 - 8 weeks) to confirm expected post ischemic evolution. 4.  Otherwise mild for age nonspecific white matter signal changes.  Study discussed by telephone with Dr. Daleen Bo on 02/20/2013 at 2330 hours.  MRA HEAD WITHOUT CONTRAST  Technique: Angiographic images of the Circle of Willis were obtained using MRA technique without  intravenous contrast.  Findings:  Degraded by a combination of MOTSA and motion artifact.  Antegrade flow in the posterior circulation with relatively codominant distal vertebral arteries.  Mild to moderate distal vertebral artery irregularity, greater on the left.  Moderate stenosis of the left V4 segment proximal to the left PICA origin. Right PICA origin is patent.  Vertebrobasilar junction is patent, but the basilar artery is irregular with a moderate to severe mid basilar stenosis.  The basilar tip is patent.  SCA origins are patent.  Fetal type bilateral PCA origins, more so the left, with evidence of superimposed P1 segment atherosclerosis.  Bilateral PCA branches are within normal limits.  Antegrade flow in both ICA siphons with ICA dolichoectasia, mild irregularity, and no stenosis.  Ophthalmic and posterior communicating artery origins are within normal limits.  Carotid termini are patent, but the left A1 segment is dominant. The anterior communicating artery is present.  There is a median artery of the corpus callosum which it is difficult to visualize much beyond its origin.  The other  ACA branches have more normal appearance.  Both MCA origins are within normal limits.  Bilateral MCA branches are within normal limits.  IMPRESSION: 1.  Intracranial MRA degraded by a combination of MOTSA and motion artifact. 2.  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 3.  However, see the neck MRA findings below, which also included these portions of the intracranial circulation. 4.  Dolichoectasia of the ICA siphons.  Dominant left ACA A1 segment.  MRA NECK WITHOUT AND WITH CONTRAST  Technique:  Angiographic images of the neck were obtained using MRA technique without and with intravenous contrast.  Carotid stenosis measurements (when applicable) are obtained utilizing NASCET criteria, using the distal internal carotid diameter as the denominator.  Findings:  Precontrast time-of-flight images of the neck demonstrate antegrade flow in both cervical carotid and vertebral arteries. Normal time-of-flight appearance of the carotid bifurcations.  Antegrade flow continues to the skull base.  Postcontrast images suggest a bovine arch configuration.  Great vessel origins are patent without significant stenosis.  The right common carotid artery is mildly tortuous.  There is mild to moderate irregularity at the posterior right ICA origin compatible with atherosclerotic plaque.  However, no hemodynamically significant cervical ICA stenosis occurs.  The visible right ICA siphon is mildly [place]:  The visible right ICA siphon is mildly dolichoectatic is seen on the intracranial exam. The right ACA A1 segment is diminutive.  The left common carotid artery is normal.  The left carotid bifurcation is normal.  The cervical left ICA is normal aside from mild tortuosity.  The left ICA siphon and dolichoectatic as seen on the intracranial study.  The left ICA terminus is patent along with the MCA and ACA origins.  The proximal ACA branches appear  normal. There is a median artery of  the corpus callosum which demonstrates irregularity and a 6 mm flow gap at the level of the body of the corpus callosum.  There does appear to be preserved distal flow.  No hemodynamically significant proximal subclavian artery stenosis is identified.  Both vertebral artery origins are within normal limits.  Proximal left vertebral arteries tortuous.  The cervical vertebral arteries are relatively codominant and within normal limits throughout the neck.  Intracranial vertebral arteries are mildly irregular, more so the left.  There does not appear to be hemodynamically significant stenosis at the distal left vertebral artery or vertebrobasilar junction.  There is irregularity of the mid basilar artery, the these images suggest at most moderate stenosis.  IMPRESSION: 1.  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  This supports the ischemic etiology very of the MRI abnormality. 2. The MRA images do not suggest a hemodynamically significant basilar artery stenosis.  Also, the distal vertebral arteries appear more normal than they did on the intracranial portion. 3.  Mild atherosclerosis at the right ICA origin without hemodynamically significant stenosis. 4.  Otherwise negative neck MRA.   Original Report Authenticated By: Roselyn Reef, M.D.   Mr Jeri Cos Wo Contrast  02/20/2013   *RADIOLOGY REPORT*  Clinical Data:  67 year old male with right side facial droop and slurred speech.  Difficulty with balance.  Right side weakness.  Contrast: 50mL MULTIHANCE GADOBENATE DIMEGLUMINE 529 MG/ML IV SOLN  Comparison: None.  MRI HEAD WITHOUT AND WITH CONTRAST  Technique: Multiplanar, multiecho pulse sequences of the brain and surrounding structures were obtained according to standard protocol without and with intravenous contrast.  Findings:  There are sharply demarcated areas of restricted  diffusion in the body of the corpus callosum and cingulate gyri, more so the right.  These are associated with T2 and FLAIR hyperintensity plus expansion of the corpus callosum.  Postcontrast images show mild if any enhancement along the periphery of the abnormal signal.  The area of abnormality encompasses 44 x 26 x 13 mm (AP by transverse by CC).  No definite hemispheric white matter involvement.  No associated hemorrhage.  No other diffusion abnormality. Major intracranial vascular flow voids are preserved, with a degree of intracranial artery dolichoectasia.  See MRA findings below.  Superimposed mild for age scattered periventricular and subcortical cerebral white matter T2 and FLAIR hyperintensity elsewhere.  No cortical encephalomalacia.  Deep gray matter nuclei, brainstem, cerebellum, pituitary, cervicomedullary junction and visualized cervical spine are within normal limits.  Visualized orbit soft tissues are within normal limits.  Visualized paranasal sinuses and mastoids are clear.  Grossly normal visualized internal auditory structures. Visualized bone marrow signal is within normal limits.  Negative scalp soft tissues.  IMPRESSION: 1.  Abnormal mass-like signal with restricted diffusion in the body of the corpus callosum, and also involving the cingulate gyri more so the right.  Minimal if any associated enhancement.  No associated hemorrhage or significant mass effect on adjacent structures. 2.  Top differential considerations for #1 include acute ACA / median artery corpus callosum infarcts (favored, see neck MRA findings below) versus a primary brain tumor simulating a stroke (moderate to high-grade glioma). 3.  In light of #2, recommend imaging surveillance (e.g. 6 - 8 weeks) to confirm expected post ischemic evolution. 4.  Otherwise mild for age nonspecific white matter signal changes.  Study discussed by telephone with Dr. Daleen Bo on 02/20/2013 at 2330  hours.  MRA HEAD WITHOUT CONTRAST   Technique: Angiographic images of the Circle of Willis were obtained using MRA technique without  intravenous contrast.  Findings:  Degraded by a combination of MOTSA and motion artifact.  Antegrade flow in the posterior circulation with relatively codominant distal vertebral arteries.  Mild to moderate distal vertebral artery irregularity, greater on the left.  Moderate stenosis of the left V4 segment proximal to the left PICA origin. Right PICA origin is patent.  Vertebrobasilar junction is patent, but the basilar artery is irregular with a moderate to severe mid basilar stenosis.  The basilar tip is patent.  SCA origins are patent.  Fetal type bilateral PCA origins, more so the left, with evidence of superimposed P1 segment atherosclerosis.  Bilateral PCA branches are within normal limits.  Antegrade flow in both ICA siphons with ICA dolichoectasia, mild irregularity, and no stenosis.  Ophthalmic and posterior communicating artery origins are within normal limits.  Carotid termini are patent, but the left A1 segment is dominant. The anterior communicating artery is present.  There is a median artery of the corpus callosum which it is difficult to visualize much beyond its origin.  The other ACA branches have more normal appearance.  Both MCA origins are within normal limits.  Bilateral MCA branches are within normal limits.  IMPRESSION: 1.  Intracranial MRA degraded by a combination of MOTSA and motion artifact. 2.  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 3.  However, see the neck MRA findings below, which also included these portions of the intracranial circulation. 4.  Dolichoectasia of the ICA siphons.  Dominant left ACA A1 segment.  MRA NECK WITHOUT AND WITH CONTRAST  Technique:  Angiographic images of the neck were obtained using MRA technique without and with intravenous contrast.  Carotid stenosis measurements (when  applicable) are obtained utilizing NASCET criteria, using the distal internal carotid diameter as the denominator.  Findings:  Precontrast time-of-flight images of the neck demonstrate antegrade flow in both cervical carotid and vertebral arteries. Normal time-of-flight appearance of the carotid bifurcations.  Antegrade flow continues to the skull base.  Postcontrast images suggest a bovine arch configuration.  Great vessel origins are patent without significant stenosis.  The right common carotid artery is mildly tortuous.  There is mild to moderate irregularity at the posterior right ICA origin compatible with atherosclerotic plaque.  However, no hemodynamically significant cervical ICA stenosis occurs.  The visible right ICA siphon is mildly [place]:  The visible right ICA siphon is mildly dolichoectatic is seen on the intracranial exam. The right ACA A1 segment is diminutive.  The left common carotid artery is normal.  The left carotid bifurcation is normal.  The cervical left ICA is normal aside from mild tortuosity.  The left ICA siphon and dolichoectatic as seen on the intracranial study.  The left ICA terminus is patent along with the MCA and ACA origins.  The proximal ACA branches appear normal. There is a median artery of the corpus callosum which demonstrates irregularity and a 6 mm flow gap at the level of the body of the corpus callosum.  There does appear to be preserved distal flow.  No hemodynamically significant proximal subclavian artery stenosis is identified.  Both vertebral artery origins are within normal limits.  Proximal left vertebral arteries tortuous.  The cervical vertebral arteries are relatively codominant and within normal limits throughout the neck.  Intracranial vertebral arteries are mildly irregular, more  so the left.  There does not appear to be hemodynamically significant stenosis at the distal left vertebral artery or vertebrobasilar junction.  There is irregularity of the mid  basilar artery, the these images suggest at most moderate stenosis.  IMPRESSION: 1.  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  This supports the ischemic etiology very of the MRI abnormality. 2. The MRA images do not suggest a hemodynamically significant basilar artery stenosis.  Also, the distal vertebral arteries appear more normal than they did on the intracranial portion. 3.  Mild atherosclerosis at the right ICA origin without hemodynamically significant stenosis. 4.  Otherwise negative neck MRA.   Original Report Authenticated By: Roselyn Reef, M.D.    Assessment/Plan: Abnormal MRI study with corpus callosum and cingulate gyrus lesion as described above. Unclear whether this is a low-grade mass lesion or ischemic lesion. Chronicity of patient's symptoms with somewhat progressive nature favors the former.  Recommendations: 1. PET scan to further evaluate corpus callosum and cingulate gyrus lesion to possibly distinguish neoplasm from infarction. 2. EEG to rule out indications new onset partial seizure disorder. 3. Echocardiogram, hemoglobin A1c and fasting lipid panel. 4. Continue aspirin 81 mg per day. 5. Physical therapy medication therapy consults. 6. Neurosurgery consultation, as well as Oncology consultation, if lesion on PET scan appears to be more likely neoplastic.  Rush Farmer M.D. [facility] [phone]  02/21/2013, 12:54 AM

## 2013-02-21 NOTE — Progress Notes (Signed)
EEG Completed; Results Pending  

## 2013-02-21 NOTE — Progress Notes (Signed)
Utilization review completed. Mehdi Gironda, RN, BSN. 

## 2013-02-21 NOTE — Progress Notes (Signed)
PT Cancellation Note  Patient Details Name: Cody Silva MRN: [id number] DOB: 11-28-1945   Cancelled Treatment:    Reason Eval/Treat Not Completed: Patient not medically ready--order to begin 6/21    Ragen Laver 02/21/2013, 7:18 AM

## 2013-02-21 NOTE — ED Notes (Signed)
Admitting MD at bedside.

## 2013-02-21 NOTE — H&P (Addendum)
PCP: [facility]   Chief Complaint:  falls  HPI: Cody Silva is a 67 y.o. male   has a past medical history of Hypertension; Diabetes mellitus without complication; Diabetic retinopathy; and Diabetic neuropathy.   Presented with  3 month hx of intermitted slurred speech, he has occasional episodes of both legs going weak, he had one episode of syncopal even few months ago. The weakness fluctuates and switches sides. He has occasional blurred vision but not double vision. This episoseds have been becoming more frequent. Wife also noted some Left facial droop as well for the past 1 week. He has gone to [place] on 4 occasions to have this evaluated. He have had a few CT's but not a recent MRI. He reportedly had an echogram done last Friday. He does report tinnitus in right ear and had a hearing test done for that. Today patient had 2 episodes of falling at which point family brought him to ER. 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 Neurology have seen patient in consult and would like to have PET scan ordered at this time to further evaluate this lesion. IF worrisome for mass NS consult will be needed.    Review of Systems:    Pertinent positives include: fatigue, frequency of urination, weakness, slurred speech, gait abnormality,   Constitutional:  No weight loss, night sweats, Fevers, chills, weight loss  HEENT:  No headaches, Difficulty swallowing,Tooth/dental problems,Sore throat,  No sneezing, itching, ear ache, nasal congestion, post nasal drip,  Cardio-vascular:  No chest pain, Orthopnea, PND, anasarca, dizziness, palpitations.no Bilateral lower extremity swelling  GI:  No heartburn, indigestion, abdominal pain, nausea, vomiting, diarrhea, change in bowel habits, loss of appetite, melena, blood in stool, hematemesis Resp:  no shortness of breath at rest. No dyspnea on exertion, No excess mucus, no productive cough, No non-productive cough, No  coughing up of blood.No change in color of mucus.No wheezing. Skin:  no rash or lesions. No jaundice GU:  no dysuria, change in color of urine, no urgency or frequency. No straining to urinate.  No flank pain.  Musculoskeletal:  No joint pain or no joint swelling. No decreased range of motion. No back pain.  Psych:  No change in mood or affect. No depression or anxiety. No memory loss.  Neuro: no localizing neurological complaints, no tingling, no  no double vision, no , no  confusion  Otherwise ROS are negative except for above, 10 systems were reviewed  Past Medical History: Past Medical History  Diagnosis Date  . Hypertension   . Diabetes mellitus without complication   . Diabetic retinopathy   . Diabetic neuropathy    Past Surgical History  Procedure Laterality Date  . Eye surgery    . Arm surgery      Tumor removed      Medications: Prior to Admission medications   Medication Sig Start Date End Date Taking? Authorizing Provider  albuterol (PROVENTIL HFA;VENTOLIN HFA) 108 (90 BASE) MCG/ACT inhaler Inhale 2 puffs into the lungs every 6 (six) hours as needed for wheezing.   Yes Historical Provider, MD  aspirin EC 81 MG tablet Take 81 mg by mouth daily.   Yes Historical Provider, MD  Aspirin-Salicylamide-Caffeine (BC HEADACHE POWDER PO) Take 1 packet by mouth daily as needed (for headache).   Yes Historical Provider, MD  carvedilol (COREG) 25 MG tablet Take 25 mg by mouth 2 (two) times daily with a meal.   Yes Historical Provider, MD  citalopram (CELEXA) 40 MG tablet Take 20 mg by mouth daily.   Yes Historical Provider, MD  cloNIDine (CATAPRES - DOSED IN MG/24 HR) 0.3 mg/24hr Place 1 patch onto the skin once a week.   Yes Historical Provider, MD  fluocinonide cream (LIDEX) [id number] % Apply 1 application topically 2 (two) times daily.   Yes Historical Provider, MD  fluticasone (FLONASE) 50 MCG/ACT nasal spray Place 2 sprays into the nose daily.   Yes Historical Provider, MD   latanoprost (XALATAN) 0.005 % ophthalmic solution Place 1 drop into the left eye at bedtime.   Yes Historical Provider, MD  losartan (COZAAR) 100 MG tablet Take 100 mg by mouth daily.   Yes Historical Provider, MD  Multiple Vitamins-Minerals (MULTIVITAMIN PO) Take 1 tablet by mouth daily.   Yes Historical Provider, MD  polyvinyl alcohol (LIQUIFILM TEARS) 1.4 % ophthalmic solution Place 1 drop into both eyes 5 (five) times daily as needed (for dry eyes).   Yes Historical Provider, MD  simvastatin (ZOCOR) 40 MG tablet Take 40 mg by mouth every evening.   Yes Historical Provider, MD  Skin Protectants, Misc. (EUCERIN) cream Apply 1 application topically as needed (for dry feet).   Yes Historical Provider, MD  spironolactone (ALDACTONE) 25 MG tablet Take 25 mg by mouth daily.   Yes Historical Provider, MD    Allergies:  No Known Allergies  Social History:  Ambulatory  walker cane Lives at  Home with family   reports that he has never smoked. He does not have any smokeless tobacco history on file. He reports that he does not drink alcohol or use illicit drugs.   Family History: family history includes Breast cancer in his mother; Diabetes in his mother; and Stroke in his brother.    Physical Exam: Patient Vitals for the past 24 hrs:  BP Temp Temp src Pulse Resp SpO2  02/21/13 0004 - 98.1 F (36.7 C) - - - -  02/20/13 1900 163/93 mmHg - - 70 17 96 %  02/20/13 1830 144/84 mmHg - - 68 21 97 %  02/20/13 1800 121/73 mmHg - - 66 5 98 %  02/20/13 1730 142/79 mmHg - - 68 16 100 %  02/20/13 1700 145/75 mmHg - - 68 15 98 %  02/20/13 1640 152/81 mmHg 98 F (36.7 C) Oral - 17 100 %    1. General:  in No Acute distress 2. Psychological: Alert and  Oriented 3. Head/ENT:   Moist   Mucous Membranes                          Head Non traumatic, neck supple                          Normal  Dentition 4. SKIN: normal  Skin turgor,  Skin clean Dry and intact no rash 5. Heart: Regular rate and rhythm  no Murmur, Rub or gallop 6. Lungs: Clear to auscultation bilaterally, no wheezes or crackles   7. Abdomen: Soft, non-tender, Non distended 8. Lower extremities: no clubbing, cyanosis, or edema 9. Neurologically Left facial droop mild, mild right hand weakness.  states both legs feel hard to get off the bed but able to do so.  10. MSK: Normal range of motion  body mass index is unknown because there is no height or weight on file.   Labs on Admission:   Recent Labs  02/20/13 1702  NA 135  K 3.6  CL 96  CO2 30  GLUCOSE 201*  BUN 24*  CREATININE 1.52*  CALCIUM 9.3   No results found for this basename: AST, ALT, ALKPHOS, BILITOT, PROT, ALBUMIN,  in the last 72 hours No results found for this basename: LIPASE, AMYLASE,  in the last 72 hours  Recent Labs  02/20/13 1702  WBC 5.8  NEUTROABS 4.1  HGB 12.9*  HCT 35.8*  MCV 84.2  PLT 161   No results found for this basename: CKTOTAL, CKMB, CKMBINDEX, TROPONINI,  in the last 72 hours No results found for this basename: TSH, T4TOTAL, FREET3, T3FREE, THYROIDAB,  in the last 72 hours No results found for this basename: VITAMINB12, FOLATE, FERRITIN, TIBC, IRON, RETICCTPCT,  in the last 72 hours No results found for this basename: HGBA1C    CrCl is unknown because there is no height on file for the current visit. ABG No results found for this basename: phart, pco2, po2, hco3, tco2, acidbasedef, o2sat     No results found for this basename: DDIMER     Other results:  I have pearsonaly reviewed this: ECG REPORT  Rate: 71  Rhythm: NSR ST&T Change: no ischemic changes  UA  No UTI   Cultures: No results found for this basename: sdes, specrequest, cult, reptstatus       Radiological Exams on Admission: Mr Virgel Paling Wo Contrast  02/20/2013   *RADIOLOGY REPORT*  Clinical Data:  67 year old male with right side facial droop and slurred speech.  Difficulty with balance.  Right side weakness.  Contrast: 57mL MULTIHANCE  GADOBENATE DIMEGLUMINE 529 MG/ML IV SOLN  Comparison: None.  MRI HEAD WITHOUT AND WITH CONTRAST  Technique: Multiplanar, multiecho pulse sequences of the brain and surrounding structures were obtained according to standard protocol without and with intravenous contrast.  Findings:  There are sharply demarcated areas of restricted diffusion in the body of the corpus callosum and cingulate gyri, more so the right.  These are associated with T2 and FLAIR hyperintensity plus expansion of the corpus callosum.  Postcontrast images show mild if any enhancement along the periphery of the abnormal signal.  The area of abnormality encompasses 44 x 26 x 13 mm (AP by transverse by CC).  No definite hemispheric white matter involvement.  No associated hemorrhage.  No other diffusion abnormality. Major intracranial vascular flow voids are preserved, with a degree of intracranial artery dolichoectasia.  See MRA findings below.  Superimposed mild for age scattered periventricular and subcortical cerebral white matter T2 and FLAIR hyperintensity elsewhere.  No cortical encephalomalacia.  Deep gray matter nuclei, brainstem, cerebellum, pituitary, cervicomedullary junction and visualized cervical spine are within normal limits.  Visualized orbit soft tissues are within normal limits.  Visualized paranasal sinuses and mastoids are clear.  Grossly normal visualized internal auditory structures. Visualized bone marrow signal is within normal limits.  Negative scalp soft tissues.  IMPRESSION: 1.  Abnormal mass-like signal with restricted diffusion in the body of the corpus callosum, and also involving the cingulate gyri more so the right.  Minimal if any associated enhancement.  No associated hemorrhage or significant mass effect on adjacent structures. 2.  Top differential considerations for #1 include acute ACA / median artery corpus callosum infarcts (favored, see neck MRA findings below) versus a primary brain tumor simulating a  stroke (moderate to high-grade glioma). 3.  In light of #2, recommend imaging surveillance (e.g. 6 - 8 weeks) to confirm expected post ischemic evolution. 4.  Otherwise mild for age nonspecific white  matter signal changes.  Study discussed by telephone with Dr. Daleen Bo on 02/20/2013 at 2330 hours.  MRA HEAD WITHOUT CONTRAST  Technique: Angiographic images of the Circle of Willis were obtained using MRA technique without  intravenous contrast.  Findings:  Degraded by a combination of MOTSA and motion artifact.  Antegrade flow in the posterior circulation with relatively codominant distal vertebral arteries.  Mild to moderate distal vertebral artery irregularity, greater on the left.  Moderate stenosis of the left V4 segment proximal to the left PICA origin. Right PICA origin is patent.  Vertebrobasilar junction is patent, but the basilar artery is irregular with a moderate to severe mid basilar stenosis.  The basilar tip is patent.  SCA origins are patent.  Fetal type bilateral PCA origins, more so the left, with evidence of superimposed P1 segment atherosclerosis.  Bilateral PCA branches are within normal limits.  Antegrade flow in both ICA siphons with ICA dolichoectasia, mild irregularity, and no stenosis.  Ophthalmic and posterior communicating artery origins are within normal limits.  Carotid termini are patent, but the left A1 segment is dominant. The anterior communicating artery is present.  There is a median artery of the corpus callosum which it is difficult to visualize much beyond its origin.  The other ACA branches have more normal appearance.  Both MCA origins are within normal limits.  Bilateral MCA branches are within normal limits.  IMPRESSION: 1.  Intracranial MRA degraded by a combination of MOTSA and motion artifact. 2.  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 3.  However, see the neck MRA findings below,  which also included these portions of the intracranial circulation. 4.  Dolichoectasia of the ICA siphons.  Dominant left ACA A1 segment.  MRA NECK WITHOUT AND WITH CONTRAST  Technique:  Angiographic images of the neck were obtained using MRA technique without and with intravenous contrast.  Carotid stenosis measurements (when applicable) are obtained utilizing NASCET criteria, using the distal internal carotid diameter as the denominator.  Findings:  Precontrast time-of-flight images of the neck demonstrate antegrade flow in both cervical carotid and vertebral arteries. Normal time-of-flight appearance of the carotid bifurcations.  Antegrade flow continues to the skull base.  Postcontrast images suggest a bovine arch configuration.  Great vessel origins are patent without significant stenosis.  The right common carotid artery is mildly tortuous.  There is mild to moderate irregularity at the posterior right ICA origin compatible with atherosclerotic plaque.  However, no hemodynamically significant cervical ICA stenosis occurs.  The visible right ICA siphon is mildly [place]:  The visible right ICA siphon is mildly dolichoectatic is seen on the intracranial exam. The right ACA A1 segment is diminutive.  The left common carotid artery is normal.  The left carotid bifurcation is normal.  The cervical left ICA is normal aside from mild tortuosity.  The left ICA siphon and dolichoectatic as seen on the intracranial study.  The left ICA terminus is patent along with the MCA and ACA origins.  The proximal ACA branches appear normal. There is a median artery of the corpus callosum which demonstrates irregularity and a 6 mm flow gap at the level of the body of the corpus callosum.  There does appear to be preserved distal flow.  No hemodynamically significant proximal subclavian artery stenosis is identified.  Both vertebral artery origins are within normal limits.  Proximal left vertebral arteries tortuous.  The cervical  vertebral arteries are relatively  codominant and within normal limits throughout the neck.  Intracranial vertebral arteries are mildly irregular, more so the left.  There does not appear to be hemodynamically significant stenosis at the distal left vertebral artery or vertebrobasilar junction.  There is irregularity of the mid basilar artery, the these images suggest at most moderate stenosis.  IMPRESSION: 1.  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  This supports the ischemic etiology very of the MRI abnormality. 2. The MRA images do not suggest a hemodynamically significant basilar artery stenosis.  Also, the distal vertebral arteries appear more normal than they did on the intracranial portion. 3.  Mild atherosclerosis at the right ICA origin without hemodynamically significant stenosis. 4.  Otherwise negative neck MRA.   Original Report Authenticated By: Roselyn Reef, M.D.   Mr Angiogram Neck W Wo Contrast  02/20/2013   *RADIOLOGY REPORT*  Clinical Data:  67 year old male with right side facial droop and slurred speech.  Difficulty with balance.  Right side weakness.  Contrast: 67mL MULTIHANCE GADOBENATE DIMEGLUMINE 529 MG/ML IV SOLN  Comparison: None.  MRI HEAD WITHOUT AND WITH CONTRAST  Technique: Multiplanar, multiecho pulse sequences of the brain and surrounding structures were obtained according to standard protocol without and with intravenous contrast.  Findings:  There are sharply demarcated areas of restricted diffusion in the body of the corpus callosum and cingulate gyri, more so the right.  These are associated with T2 and FLAIR hyperintensity plus expansion of the corpus callosum.  Postcontrast images show mild if any enhancement along the periphery of the abnormal signal.  The area of abnormality encompasses 44 x 26 x 13 mm (AP by transverse by CC).  No definite hemispheric white matter  involvement.  No associated hemorrhage.  No other diffusion abnormality. Major intracranial vascular flow voids are preserved, with a degree of intracranial artery dolichoectasia.  See MRA findings below.  Superimposed mild for age scattered periventricular and subcortical cerebral white matter T2 and FLAIR hyperintensity elsewhere.  No cortical encephalomalacia.  Deep gray matter nuclei, brainstem, cerebellum, pituitary, cervicomedullary junction and visualized cervical spine are within normal limits.  Visualized orbit soft tissues are within normal limits.  Visualized paranasal sinuses and mastoids are clear.  Grossly normal visualized internal auditory structures. Visualized bone marrow signal is within normal limits.  Negative scalp soft tissues.  IMPRESSION: 1.  Abnormal mass-like signal with restricted diffusion in the body of the corpus callosum, and also involving the cingulate gyri more so the right.  Minimal if any associated enhancement.  No associated hemorrhage or significant mass effect on adjacent structures. 2.  Top differential considerations for #1 include acute ACA / median artery corpus callosum infarcts (favored, see neck MRA findings below) versus a primary brain tumor simulating a stroke (moderate to high-grade glioma). 3.  In light of #2, recommend imaging surveillance (e.g. 6 - 8 weeks) to confirm expected post ischemic evolution. 4.  Otherwise mild for age nonspecific white matter signal changes.  Study discussed by telephone with Dr. Daleen Bo on 02/20/2013 at 2330 hours.  MRA HEAD WITHOUT CONTRAST  Technique: Angiographic images of the Circle of Willis were obtained using MRA technique without  intravenous contrast.  Findings:  Degraded by a combination of MOTSA and motion artifact.  Antegrade flow in the posterior circulation with relatively codominant distal vertebral arteries.  Mild to moderate distal vertebral artery irregularity, greater on the left.  Moderate stenosis of the  left V4 segment proximal to the left PICA origin. Right PICA origin is patent.  Vertebrobasilar junction is patent, but the basilar artery is irregular with a moderate to severe mid basilar stenosis.  The basilar tip is patent.  SCA origins are patent.  Fetal type bilateral PCA origins, more so the left, with evidence of superimposed P1 segment atherosclerosis.  Bilateral PCA branches are within normal limits.  Antegrade flow in both ICA siphons with ICA dolichoectasia, mild irregularity, and no stenosis.  Ophthalmic and posterior communicating artery origins are within normal limits.  Carotid termini are patent, but the left A1 segment is dominant. The anterior communicating artery is present.  There is a median artery of the corpus callosum which it is difficult to visualize much beyond its origin.  The other ACA branches have more normal appearance.  Both MCA origins are within normal limits.  Bilateral MCA branches are within normal limits.  IMPRESSION: 1.  Intracranial MRA degraded by a combination of MOTSA and motion artifact. 2.  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 3.  However, see the neck MRA findings below, which also included these portions of the intracranial circulation. 4.  Dolichoectasia of the ICA siphons.  Dominant left ACA A1 segment.  MRA NECK WITHOUT AND WITH CONTRAST  Technique:  Angiographic images of the neck were obtained using MRA technique without and with intravenous contrast.  Carotid stenosis measurements (when applicable) are obtained utilizing NASCET criteria, using the distal internal carotid diameter as the denominator.  Findings:  Precontrast time-of-flight images of the neck demonstrate antegrade flow in both cervical carotid and vertebral arteries. Normal time-of-flight appearance of the carotid bifurcations.  Antegrade flow continues to the skull base.  Postcontrast images suggest a bovine  arch configuration.  Great vessel origins are patent without significant stenosis.  The right common carotid artery is mildly tortuous.  There is mild to moderate irregularity at the posterior right ICA origin compatible with atherosclerotic plaque.  However, no hemodynamically significant cervical ICA stenosis occurs.  The visible right ICA siphon is mildly [place]:  The visible right ICA siphon is mildly dolichoectatic is seen on the intracranial exam. The right ACA A1 segment is diminutive.  The left common carotid artery is normal.  The left carotid bifurcation is normal.  The cervical left ICA is normal aside from mild tortuosity.  The left ICA siphon and dolichoectatic as seen on the intracranial study.  The left ICA terminus is patent along with the MCA and ACA origins.  The proximal ACA branches appear normal. There is a median artery of the corpus callosum which demonstrates irregularity and a 6 mm flow gap at the level of the body of the corpus callosum.  There does appear to be preserved distal flow.  No hemodynamically significant proximal subclavian artery stenosis is identified.  Both vertebral artery origins are within normal limits.  Proximal left vertebral arteries tortuous.  The cervical vertebral arteries are relatively codominant and within normal limits throughout the neck.  Intracranial vertebral arteries are mildly irregular, more so the left.  There does not appear to be hemodynamically significant stenosis at the distal left vertebral artery or vertebrobasilar junction.  There is irregularity of the mid basilar artery, the these images suggest at most moderate stenosis.  IMPRESSION: 1.  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  This supports the ischemic etiology very of the MRI abnormality. 2. The MRA images do not suggest a hemodynamically significant basilar artery stenosis.  Also, the distal  vertebral arteries appear more normal than they did on the intracranial portion. 3.  Mild atherosclerosis at the right ICA origin without hemodynamically significant stenosis. 4.  Otherwise negative neck MRA.   Original Report Authenticated By: Roselyn Reef, M.D.   Mr Jeri Cos Wo Contrast  02/20/2013   *RADIOLOGY REPORT*  Clinical Data:  67 year old male with right side facial droop and slurred speech.  Difficulty with balance.  Right side weakness.  Contrast: 41mL MULTIHANCE GADOBENATE DIMEGLUMINE 529 MG/ML IV SOLN  Comparison: None.  MRI HEAD WITHOUT AND WITH CONTRAST  Technique: Multiplanar, multiecho pulse sequences of the brain and surrounding structures were obtained according to standard protocol without and with intravenous contrast.  Findings:  There are sharply demarcated areas of restricted diffusion in the body of the corpus callosum and cingulate gyri, more so the right.  These are associated with T2 and FLAIR hyperintensity plus expansion of the corpus callosum.  Postcontrast images show mild if any enhancement along the periphery of the abnormal signal.  The area of abnormality encompasses 44 x 26 x 13 mm (AP by transverse by CC).  No definite hemispheric white matter involvement.  No associated hemorrhage.  No other diffusion abnormality. Major intracranial vascular flow voids are preserved, with a degree of intracranial artery dolichoectasia.  See MRA findings below.  Superimposed mild for age scattered periventricular and subcortical cerebral white matter T2 and FLAIR hyperintensity elsewhere.  No cortical encephalomalacia.  Deep gray matter nuclei, brainstem, cerebellum, pituitary, cervicomedullary junction and visualized cervical spine are within normal limits.  Visualized orbit soft tissues are within normal limits.  Visualized paranasal sinuses and mastoids are clear.  Grossly normal visualized internal auditory structures. Visualized bone marrow signal is within normal limits.  Negative scalp  soft tissues.  IMPRESSION: 1.  Abnormal mass-like signal with restricted diffusion in the body of the corpus callosum, and also involving the cingulate gyri more so the right.  Minimal if any associated enhancement.  No associated hemorrhage or significant mass effect on adjacent structures. 2.  Top differential considerations for #1 include acute ACA / median artery corpus callosum infarcts (favored, see neck MRA findings below) versus a primary brain tumor simulating a stroke (moderate to high-grade glioma). 3.  In light of #2, recommend imaging surveillance (e.g. 6 - 8 weeks) to confirm expected post ischemic evolution. 4.  Otherwise mild for age nonspecific white matter signal changes.  Study discussed by telephone with Dr. Daleen Bo on 02/20/2013 at 2330 hours.  MRA HEAD WITHOUT CONTRAST  Technique: Angiographic images of the Circle of Willis were obtained using MRA technique without  intravenous contrast.  Findings:  Degraded by a combination of MOTSA and motion artifact.  Antegrade flow in the posterior circulation with relatively codominant distal vertebral arteries.  Mild to moderate distal vertebral artery irregularity, greater on the left.  Moderate stenosis of the left V4 segment proximal to the left PICA origin. Right PICA origin is patent.  Vertebrobasilar junction is patent, but the basilar artery is irregular with a moderate to severe mid basilar stenosis.  The basilar tip is patent.  SCA origins are patent.  Fetal type bilateral PCA origins, more so the left, with evidence of superimposed P1 segment atherosclerosis.  Bilateral PCA branches are within normal limits.  Antegrade flow in both  ICA siphons with ICA dolichoectasia, mild irregularity, and no stenosis.  Ophthalmic and posterior communicating artery origins are within normal limits.  Carotid termini are patent, but the left A1 segment is dominant. The anterior communicating artery is present.  There is a median artery of the corpus  callosum which it is difficult to visualize much beyond its origin.  The other ACA branches have more normal appearance.  Both MCA origins are within normal limits.  Bilateral MCA branches are within normal limits.  IMPRESSION: 1.  Intracranial MRA degraded by a combination of MOTSA and motion artifact. 2.  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 3.  However, see the neck MRA findings below, which also included these portions of the intracranial circulation. 4.  Dolichoectasia of the ICA siphons.  Dominant left ACA A1 segment.  MRA NECK WITHOUT AND WITH CONTRAST  Technique:  Angiographic images of the neck were obtained using MRA technique without and with intravenous contrast.  Carotid stenosis measurements (when applicable) are obtained utilizing NASCET criteria, using the distal internal carotid diameter as the denominator.  Findings:  Precontrast time-of-flight images of the neck demonstrate antegrade flow in both cervical carotid and vertebral arteries. Normal time-of-flight appearance of the carotid bifurcations.  Antegrade flow continues to the skull base.  Postcontrast images suggest a bovine arch configuration.  Great vessel origins are patent without significant stenosis.  The right common carotid artery is mildly tortuous.  There is mild to moderate irregularity at the posterior right ICA origin compatible with atherosclerotic plaque.  However, no hemodynamically significant cervical ICA stenosis occurs.  The visible right ICA siphon is mildly [place]:  The visible right ICA siphon is mildly dolichoectatic is seen on the intracranial exam. The right ACA A1 segment is diminutive.  The left common carotid artery is normal.  The left carotid bifurcation is normal.  The cervical left ICA is normal aside from mild tortuosity.  The left ICA siphon and dolichoectatic as seen on the intracranial study.  The left ICA terminus is  patent along with the MCA and ACA origins.  The proximal ACA branches appear normal. There is a median artery of the corpus callosum which demonstrates irregularity and a 6 mm flow gap at the level of the body of the corpus callosum.  There does appear to be preserved distal flow.  No hemodynamically significant proximal subclavian artery stenosis is identified.  Both vertebral artery origins are within normal limits.  Proximal left vertebral arteries tortuous.  The cervical vertebral arteries are relatively codominant and within normal limits throughout the neck.  Intracranial vertebral arteries are mildly irregular, more so the left.  There does not appear to be hemodynamically significant stenosis at the distal left vertebral artery or vertebrobasilar junction.  There is irregularity of the mid basilar artery, the these images suggest at most moderate stenosis.  IMPRESSION: 1.  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  This supports the ischemic etiology very of the MRI abnormality. 2. The MRA images do not suggest a hemodynamically significant basilar artery stenosis.  Also, the distal vertebral arteries appear more normal than they did on the intracranial portion. 3.  Mild atherosclerosis at the right ICA origin without hemodynamically significant stenosis. 4.  Otherwise negative neck MRA.   Original Report Authenticated By: Roselyn Reef, M.D.  Chart has been reviewed  Assessment/Plan  67 year old gentleman with abnormal MRI of the brain worrisome for CVA versus brain mass been admitted for  further evaluation.   Present on Admission:  . Abnormal brain scan - acute CVA versus brain mass. Spoke to Dr. Leda Roys neurology for this point recommends ordering a PET scan to evaluate this further and if worrisome for brain mass to have neurosurgery consult to schedule biopsy. Spoke to radiology who at this point  recommends discussing PET scan with the neuro- radiologist in the morning to make sure that the correct test is ordered. For now will admit and complete CVA workup. Patient had had an echogram done last week at [facility]. Will attempt to obtain records. We'll obtain hemoglobin A1c and lipid panel continue aspirin 81 mg daily. Will  have physical therapy,  occupational therapy and speech therapy evaluate the patient . Diabetes mellitus - sensitive sliding-scale monitor blood sugars  . Hypertension - continue home medication   Prophylaxis: SCD  Protonix  CODE STATUS: FULL CODE  Other plan as per orders.  I have spent a total of  65 min on this admission time taken to discuss care with neurology  [place] 02/21/2013, 1:48 AM

## 2013-02-21 NOTE — Progress Notes (Signed)
OT Cancellation Note  Patient Details Name: Cody Silva MRN: [id number] DOB: [birth year]   Cancelled Treatment:      Reason Eval/Treat Not Completed: Patient not medically ready--order to begin 6/21   Josephine Igo Dixon 02/21/2013, 8:44 AM

## 2013-02-21 NOTE — Progress Notes (Signed)
*  PRELIMINARY RESULTS* Vascular Ultrasound Carotid Duplex (Doppler) has been completed.  Preliminary findings: Bilateral:  Less than 39% ICA stenosis.  Vertebral artery flow is antegrade.     Landry Mellow, RDMS, RVT  02/21/2013, 10:12 AM

## 2013-02-22 DIAGNOSIS — I6350 Cerebral infarction due to unspecified occlusion or stenosis of unspecified cerebral artery: Secondary | ICD-10-CM

## 2013-02-22 DIAGNOSIS — R9409 Abnormal results of other function studies of central nervous system: Secondary | ICD-10-CM

## 2013-02-22 DIAGNOSIS — E119 Type 2 diabetes mellitus without complications: Secondary | ICD-10-CM

## 2013-02-22 DIAGNOSIS — Z8673 Personal history of transient ischemic attack (TIA), and cerebral infarction without residual deficits: Secondary | ICD-10-CM

## 2013-02-22 DIAGNOSIS — R42 Dizziness and giddiness: Secondary | ICD-10-CM

## 2013-02-22 LAB — GLUCOSE, CAPILLARY: Glucose-Capillary: 180 mg/dL — ABNORMAL HIGH (ref 70–99)

## 2013-02-22 MED ORDER — HYDRALAZINE HCL 20 MG/ML IJ SOLN
10.0000 mg | Freq: Once | INTRAMUSCULAR | Status: AC
  Administered 2013-02-22: 10 mg via INTRAVENOUS
  Filled 2013-02-22: qty 1

## 2013-02-22 MED ORDER — ASPIRIN 325 MG PO TABS
325.0000 mg | ORAL_TABLET | Freq: Every day | ORAL | Status: DC
  Administered 2013-02-22 – 2013-02-24 (×3): 325 mg via ORAL
  Filled 2013-02-22 (×4): qty 1

## 2013-02-22 NOTE — Evaluation (Signed)
Speech Language Pathology Evaluation Patient Details Name: Cody Silva MRN: [id number] DOB: [birth year] Today's Date: 02/22/2013 Time: 1145-1200 SLP Time Calculation (min): 15 min  Problem List:  Patient Active Problem List   Diagnosis Date Noted  . Acute CVA (cerebrovascular accident) 02/22/2013  . Abnormal brain scan 02/21/2013  . Dizziness 02/21/2013  . Weakness 02/21/2013  . Diabetes mellitus 02/21/2013  . Hypertension 02/21/2013   Past Medical History:  Past Medical History  Diagnosis Date  . Hypertension   . Diabetes mellitus without complication   . Diabetic retinopathy   . Diabetic neuropathy    Past Surgical History:  Past Surgical History  Procedure Laterality Date  . Eye surgery    . Arm surgery      Tumor removed    HPI:  Cody Silva is a 67 y.o. male with a history of hypertension, hyperlipidemia and diabetes mellitus, diabetic neuropathy and diabetic retinopathy, who came to the emergency room 02/21/2013 following a recurrent spell of acute weakness with dizziness tendency to collapse toward the right side but with generalized weakness. Patient had been experiencing spells of this type for several months. There was associated slurred speech as well. Patient was aware of his surroundings and able to communicate with those around him coherently. No convulsive type movements had been described. An MRI of his brain was obtained which showed an area of abnormal signal involving the body of the corpus callosum as well as cingulate gyrus, right greater than left. It's unclear whether this lesion was vascular in nature or possibly neoplastic. Decreased flow in the median artery at the corpus callosum was noted on MRA. Significance is unclear. There was minimal, if any contrast-enhancement.   Assessment / Plan / Recommendation Clinical Impression  Cognitive Linguistic Evaluation completed per stroke protocol.  No deficits noted in area of receptive and expressive  language skills.  No deficts noted in area of cognition.  Slight dysarthria but speech intelligible at conversational level. Demonstrates intellectual, emergent, and anticipatory awareness of current physical deficits.  Patient stated that he will have 24 hour supervison from spouse at time of discharge but she is not physically able to assist with ADL's.  Pending results of PT/OT evaluation recommend CIR consult.  No Speech Language Pathology Services warranted in acure care setting as patient will receive necessary supervision.  May benefit from [place] consult at next level of care to assess higher level problem solving.  ST to sign off as education complete.     SLP Assessment  All further Speech Lanaguage Pathology  needs can be addressed in the next venue of care    Follow Up Recommendations  Inpatient Rehab               SLP Evaluation Prior Functioning  Cognitive/Linguistic Baseline: Within functional limits Type of Home: House Lives With: Spouse Education: Highschool education worked at [employer]: Part time employment   Cognition  Overall Cognitive Status: Within Functional Limits for tasks assessed Arousal/Alertness: Awake/alert Orientation Level: Oriented X4 Memory: Appears intact Awareness: Appears intact Problem Solving: Appears intact Safety/Judgment: Appears intact    Comprehension  Auditory Comprehension Overall Auditory Comprehension: Appears within functional limits for tasks assessed Visual Recognition/Discrimination Discrimination: Not tested Reading Comprehension Reading Status: Not tested    Expression Expression Primary Mode of Expression: Verbal Verbal Expression Overall Verbal Expression: Appears within functional limits for tasks assessed Written Expression Dominant Hand: Right Written Expression: Not tested   Oral / Motor Oral Motor/Sensory Function Overall Oral Motor/Sensory  Function: Appears within functional limits for tasks  assessed Motor Speech Overall Motor Speech: Appears within functional limits for tasks assessed   [place], [facility] 02/22/2013, 12:18 PM

## 2013-02-22 NOTE — Evaluation (Signed)
Occupational Therapy Evaluation Patient Details Name: Cody Silva MRN: [id number] DOB: Jan 11, 1946 Today's Date: 02/22/2013 Time: [id number] OT Time Calculation (min): 25 min  OT Assessment / Plan / Recommendation Clinical Impression  This 67 year old man was admitted with ischemic CVA vs mass at corpus collosum & cingulate gyrus.  He was independent with adls prior to admission and his wife recently had back surgery.  he will benefit from skilled OT with supervision level goals in acute.      OT Assessment  Patient needs continued OT Services    Follow Up Recommendations  CIR    Barriers to Discharge      Equipment Recommendations  None recommended by OT (has high commode and 3:1 (wifes))    Recommendations for Other Services Rehab consult  Frequency  Min 3X/week    Precautions / Restrictions Precautions Precautions: Fall Restrictions Weight Bearing Restrictions: No   Pertinent Vitals/Pain No pain reported    ADL  Grooming: Supervision/safety;Teeth care Where Assessed - Grooming: Supported standing Upper Body Bathing: Set up Where Assessed - Upper Body Bathing: Unsupported sitting Lower Body Bathing: Minimal assistance Where Assessed - Lower Body Bathing: Supported sit to stand Upper Body Dressing: Set up Where Assessed - Upper Body Dressing: Unsupported sitting Lower Body Dressing: Minimal assistance Where Assessed - Lower Body Dressing: Supported sit to stand Toilet Transfer: Simulated;Minimal assistance Toilet Transfer Method: Sit to stand (chair, bathroom sink, chair) Toileting - Clothing Manipulation and Hygiene: Min guard Where Assessed - Toileting Clothing Manipulation and Hygiene: Standing Equipment Used: Gait belt;Rolling walker Transfers/Ambulation Related to ADLs: Pt ambulated to bathroom with min A and min A for sit to stand ADL Comments: Pt initially off balance when performing sit to stand.  Needs to be mod I.  Wife recently had back surgery Pt  seems to have some word finding difficulties:  Able to communicate but takes extra time to express himself   OT Diagnosis: Generalized weakness  OT Problem List: Decreased activity tolerance;Decreased strength;Decreased knowledge of use of DME or AE;Impaired balance (sitting and/or standing) OT Treatment Interventions: Self-care/ADL training;Balance training;Patient/family education;DME and/or AE instruction   OT Goals Acute Rehab OT Goals OT Goal Formulation: With patient Time For Goal Achievement: 03/08/13 Potential to Achieve Goals: Good ADL Goals Pt Will Perform Lower Body Bathing: with supervision;Sit to stand from chair ADL Goal: Lower Body Bathing - Progress: Goal set today Pt Will Perform Lower Body Dressing: with supervision;Sit to stand from chair ADL Goal: Lower Body Dressing - Progress: Goal set today Pt Will Transfer to Toilet: with supervision;Anterior-posterior transfer;3-in-1 (and complete all aspects of toileting) ADL Goal: Toilet Transfer - Progress: Goal set today Pt Will Perform Tub/Shower Transfer: Shower transfer;Ambulation;with supervision (3:1 commode) ADL Goal: Tub/Shower Transfer - Progress: Goal set today  Visit Information  Last OT Received On: 02/22/13 Assistance Needed: +1    Subjective Data  Subjective: I have these weak spells--that concerns me (lightheadedness) Patient Stated Goal: get independent and get rid of weak spells   Prior [place] Lives With: Spouse Available Help at Discharge: Family Type of Home: House Home Access: Stairs to enter Technical brewer of Steps: 2 Home Layout: Two level;Bed/bath upstairs;1/2 bath on main level Alternate Level Stairs-Number of Steps: 1 flight Alternate Level Stairs-Rails: Right Bathroom Shower/Tub: Walk-in shower;Tub/shower unit Bathroom Toilet: Handicapped height Home Adaptive Equipment:  (wife has 3:1 downstairs) Prior Function Level of Independence: Independent Able to  Take Stairs?: Yes Driving: Yes Vocation: Part time employment Comments:  works with [employer] Communication Communication: No difficulties Dominant Hand: Right         Vision/Perception Vision - History Patient Visual Report: No change from baseline   Cognition  Cognition Arousal/Alertness: Awake/alert Behavior During Therapy: WFL for tasks assessed/performed Overall Cognitive Status: Within Functional Limits for tasks assessed    Extremity/Trunk Assessment Right Upper Extremity Assessment RUE ROM/Strength/Tone: Within functional levels Left Upper Extremity Assessment LUE ROM/Strength/Tone: Within functional levels     Mobility Transfers Transfers: Sit to Stand;Stand to Sit Sit to Stand: 4: Min assist;From bed;With upper extremity assist Stand to Sit: 4: Min guard;To chair/3-in-1;With armrests Details for Transfer Assistance: cues to scoot forward and for hand placement     Exercise     Balance Balance Balance Assessed: Yes Static Standing Balance Static Standing - Balance Support: Left upper extremity supported Static Standing - Level of Assistance: 5: Stand by assistance Static Standing - Comment/# of Minutes: 2 minutes   End of Session OT - End of Session Activity Tolerance: Patient tolerated treatment well Patient left: in bed;with call bell/phone within reach;with family/visitor present  [place] 02/22/2013, 3:18 PM Lesle Chris, OTR/L [id number] 02/22/2013

## 2013-02-22 NOTE — Progress Notes (Signed)
Patients blood pressure is 200/97. Call to MD again and a one time order of hydralazine 10mg  given and med admininstered. Will continue to monitor.

## 2013-02-22 NOTE — Progress Notes (Signed)
Patients B/P 194/97 Call to M.d  New order for hydralazine 10mg  IV. Med administered as ordered.

## 2013-02-22 NOTE — Progress Notes (Signed)
History:  Cody Silva is a 67 y.o. male with a history of hypertension, hyperlipidemia and diabetes mellitus, diabetic neuropathy and diabetic retinopathy, who came to the emergency room 02/21/2013 following a recurrent spell of acute weakness with dizziness tendency to collapse toward the right side but with generalized weakness. Patient had been experiencing spells of this type for several months. There was associated slurred speech as well. Patient was aware of his surroundings and able to communicate with those around him coherently. No convulsive type movements had been described. An MRI of his brain was obtained which showed an area of abnormal signal involving the body of the corpus callosum as well as cingulate gyrus, right greater than left. It's unclear whether this lesion was vascular in nature or possibly neoplastic. Decreased flow in the median artery at the corpus callosum was noted on MRA. Significance is unclear. There was minimal, if any contrast-enhancement.  Subjective: The patient's daughter was in her room this morning. The patient has somewhat of a flat affect but has no specific complaints at this time. He reports that he had also been to the [facility] for evaluation of the above noted symptoms.  Objective: BP 170/84  Pulse 76  Temp(Src) 98.2 F (36.8 C) (Oral)  Resp 18  Ht 6\' 4"  (1.93 m)  Wt 106.4 kg (234 lb 9.1 oz)  BMI 28.56 kg/m2  SpO2 100%  CBGs  Recent Labs  02/21/13 0616 02/21/13 1150 02/21/13 1627 02/21/13 2101 02/22/13 0634  GLUCAP 170* 199* 265* 166* 180*   Past Medical History  Diagnosis Date  . Hypertension   . Diabetes mellitus without complication   . Diabetic retinopathy   . Diabetic neuropathy      Medications: Scheduled: . aspirin EC  81 mg Oral Daily  . carvedilol  25 mg Oral BID WC  . citalopram  20 mg Oral Daily  . cloNIDine  0.3 mg Transdermal Weekly  . insulin aspart  0-5 Units Subcutaneous QHS  . insulin aspart  0-9  Units Subcutaneous TID WC  . latanoprost  1 drop Left Eye QHS  . simvastatin  40 mg Oral QPM  . spironolactone  25 mg Oral Daily    Neurologic Exam: Mental Status: Alert, oriented, thought content appropriate.  Speech fluent without evidence of aphasia. Able to follow 3 step commands without difficulty. Cranial Nerves: II- Visual fields grossly intact. III/IV/VI-Extraocular movements intact.  Pupils reactive bilaterally. V/VII-Smile symmetric, no facial weakness VIII-hearing grossly intact IX/X-normal gag XI-bilateral shoulder shrug XII-midline tongue extension Motor: 4/5 weakness hand grip on left. 4/5 left foot dorsiflexion. 5/5 right. Sensory: Light touch intact throughout, bilaterally Deep Tendon Reflexes: 2+ and symmetric throughout Plantars: equivocal Cerebellar: right arm tremor   Lab Results: CBC:  Recent Labs Lab 02/20/13 1702  WBC 5.8  NEUTROABS 4.1  HGB 12.9*  HCT 35.8*  MCV 84.2  PLT [id number]   Basic Metabolic Panel:  Recent Labs Lab 02/20/13 1702  NA 135  K 3.6  CL 96  CO2 30  GLUCOSE 201*  BUN 24*  CREATININE 1.52*  CALCIUM 9.3   Liver Function Tests: No results found for this basename: AST, ALT, ALKPHOS, BILITOT, PROT, ALBUMIN,  in the last 168 hours Hemoglobin A1C:  Recent Labs Lab 02/21/13 0520  HGBA1C 7.5*   Fasting Lipid Panel:  Recent Labs Lab 02/21/13 0520  CHOL 134  HDL 61  LDLCALC 55  TRIG 90  CHOLHDL 2.2   Thyroid Function Tests: No results found for this basename: TSH,  T4TOTAL, FREET4, T3FREE, THYROIDAB,  in the last 168 hours Coagulation: No results found for this basename: LABPROT, INR,  in the last 168 hours   Study Results:  Mr Virgel Paling Wo Contrast 02/20/2013  1.  Abnormal mass-like signal with restricted diffusion in the body of the corpus callosum, and also involving the cingulate gyri more so the right.  Minimal if any associated enhancement.  No associated hemorrhage or significant mass effect on adjacent  structures. 2.  Top differential considerations for #1 include acute ACA / median artery corpus callosum infarcts (favored, see neck MRA findings below) versus a primary brain tumor simulating a stroke (moderate to high-grade glioma). 3.  In light of #2, recommend imaging surveillance (e.g. 6 - 8 weeks) to confirm expected post ischemic evolution. 4.  Otherwise mild for age nonspecific white matter signal changes.  Study discussed by telephone with Dr. Daleen Bo on 02/20/2013 at 2330 hours.    MRA HEAD WITHOUT CONTRAST   1.  Intracranial MRA degraded by a combination of MOTSA and motion artifact. 2.  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    MRA NECK WITHOUT AND WITH CONTRAST  1.  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  This supports the ischemic etiology very of the MRI abnormality. 2. The MRA images do not suggest a hemodynamically significant basilar artery stenosis.  Also, the distal vertebral arteries appear more normal than they did on the intracranial portion. 3.  Mild atherosclerosis at the right ICA origin without hemodynamically significant stenosis. 4.  Otherwise negative neck MRA.   Original Report Authenticated By: Roselyn Reef, M.D.    Vascular Ultrasound  Carotid Duplex (Doppler) has been completed. Preliminary findings: Bilateral: Less than 39% ICA stenosis. Vertebral artery flow is antegrade  2-D echo - ejection fraction 50-55%. No obvious cardiac source of emboli.  EEG -  interpreted as normal.  Assessment  67yo male with progressive symptoms of dizziness, generalized weakness, listing to right side over the past few months. MRI abnormal with concern for mass lesion vs ischemic event of corpus callosum and cingulate gyrus. Workup is complete except for the therapists  evaluations.   Hypertension - has required intravenous hydralazine.  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  Plan   PET scan to be done on an outpatient basis.  hgb a1c 7.5 - goal less than 7.  Start therapy treatments  Risk factor modification  PT, OT following  Increase aspirin to 325 mg daily.   Mikey Bussing PA-C [facility] Pager [phone] 02/22/2013, 8:36 AM   I have personally obtained a history, examined the patient, evaluated imaging results, and formulated the assessment and plan of care. I agree with the above.   -S/p EEG - The corpus callosum on imaging appears to be an infarct due to hyperdensity on diffusion and hypodensity on ADC. There does not appear to be any edema and no enhancement on imaging.  On the other hand pt's symptoms started 6 weeks ago, it is rare to see positive diffusion imagine for 6 weeks on MRI which are usually positive up to 3 weeks. This could represent evolution/new infarct.  - Agree repeating  MRI with gad or if possible PET scan as out pt.   -PT/OT -glycemic control - Agree with above.  Leotis Pain

## 2013-02-22 NOTE — Progress Notes (Signed)
[name]  ASBERY KROESE [id number] DOB: 01-08-1946 DOA: 02/20/2013 PCP: No primary provider on file.  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 -To make sure tumor is fully ruled out, repeat MRI in 8 weeks vs OP PET scan is recommended.  Acute CVA -ECHO/Dopplers WNL. -LDL 55. -Awaiting PT/OT recs. -ASA for secondary stroke prevention.  HTN -Continue coreg, clonidine patch. -Allow permissive HTN. -BP was above 200 overnight requiring IV hydralazine. -Can consider adding another hypertensive agent if BP remains above 190.  DM -CBGs remain elevated. -Add levemir 5 units.  Code Status: Code Status Family Communication: Daughter at bedside  Disposition Plan: Pending PT/OT evals   Consultants:  Neurology   Antibiotics:  None   Subjective: Had a HE earlier, that has resolved.  Objective: Filed Vitals:   02/22/13 0600 02/22/13 0605 02/22/13 0649 02/22/13 0934  BP: 203/99 200/97 170/84 157/80  Pulse: 82 80 76 92  Temp: 98.2 F (36.8 C)   98.2 F (36.8 C)  TempSrc: Oral   Oral  Resp: 18  18 19   Height:      Weight:      SpO2: 100%   97%    Intake/Output Summary (Last 24 hours) at 02/22/13 0949 Last data filed at 02/22/13 0845  Gross per 24 hour  Intake    140 ml  Output      0 ml  Net    140 ml   Filed Weights   02/21/13 0438  Weight: 106.4 kg (234 lb 9.1 oz)    Exam:   General:  AA OX3  Cardiovascular: RRR, no M/R/G  Respiratory: Lungs CTA B  Abdomen: S/NT/ND/+BS/no masses or organomegaly noted  Extremities: no C/C/E, +pulses.  Data Reviewed: Basic Metabolic Panel:  Recent Labs Lab 02/20/13 1702  NA 135  K 3.6  CL 96  CO2 30  GLUCOSE 201*  BUN 24*  CREATININE 1.52*  CALCIUM 9.3   Liver Function Tests: No results found for this basename: AST, ALT, ALKPHOS, BILITOT, PROT, ALBUMIN,  in the last 168  hours No results found for this basename: LIPASE, AMYLASE,  in the last 168 hours No results found for this basename: AMMONIA,  in the last 168 hours CBC:  Recent Labs Lab 02/20/13 1702  WBC 5.8  NEUTROABS 4.1  HGB 12.9*  HCT 35.8*  MCV 84.2  PLT 161   Cardiac Enzymes: No results found for this basename: CKTOTAL, CKMB, CKMBINDEX, TROPONINI,  in the last 168 hours BNP (last 3 results) No results found for this basename: PROBNP,  in the last 8760 hours CBG:  Recent Labs Lab 02/21/13 0616 02/21/13 1150 02/21/13 1627 02/21/13 2101 02/22/13 0634  GLUCAP 170* 199* 265* 166* 180*    Recent Results (from the past 240 hour(s))  URINE CULTURE     Status: None   Collection Time    02/20/13  7:20 PM      Result Value Range Status   Specimen Description URINE, CLEAN CATCH   Final   Special Requests NONE   Final   Culture  Setup Time 02/20/2013 20:48   Final   Colony Count NO GROWTH   Final   Culture NO GROWTH   Final   Report Status 02/21/2013 FINAL   Final     Studies: Dg Chest 2 View  02/21/2013   *RADIOLOGY REPORT*  Clinical Data: Stroke and chest pain.  CHEST - 2 VIEW  Comparison: None.  Findings: The heart is mildly enlarged.  The aorta is ectatic.  No pulmonary edema, infiltrate or pleural fluid is identified.  Bony thorax is unremarkable.  IMPRESSION: No active disease.  Mild cardiac enlargement.   Original Report Authenticated By: Aletta Edouard, M.D.   Mr [facility] Wo Contrast  02/20/2013   *RADIOLOGY REPORT*  Clinical Data:  67 year old male with right side facial droop and slurred speech.  Difficulty with balance.  Right side weakness.  Contrast: 3mL MULTIHANCE GADOBENATE DIMEGLUMINE 529 MG/ML IV SOLN  Comparison: None.  MRI HEAD WITHOUT AND WITH CONTRAST  Technique: Multiplanar, multiecho pulse sequences of the brain and surrounding structures were obtained according to standard protocol without and with intravenous contrast.  Findings:  There are sharply demarcated  areas of restricted diffusion in the body of the corpus callosum and cingulate gyri, more so the right.  These are associated with T2 and FLAIR hyperintensity plus expansion of the corpus callosum.  Postcontrast images show mild if any enhancement along the periphery of the abnormal signal.  The area of abnormality encompasses 44 x 26 x 13 mm (AP by transverse by CC).  No definite hemispheric white matter involvement.  No associated hemorrhage.  No other diffusion abnormality. Major intracranial vascular flow voids are preserved, with a degree of intracranial artery dolichoectasia.  See MRA findings below.  Superimposed mild for age scattered periventricular and subcortical cerebral white matter T2 and FLAIR hyperintensity elsewhere.  No cortical encephalomalacia.  Deep gray matter nuclei, brainstem, cerebellum, pituitary, cervicomedullary junction and visualized cervical spine are within normal limits.  Visualized orbit soft tissues are within normal limits.  Visualized paranasal sinuses and mastoids are clear.  Grossly normal visualized internal auditory structures. Visualized bone marrow signal is within normal limits.  Negative scalp soft tissues.  IMPRESSION: 1.  Abnormal mass-like signal with restricted diffusion in the body of the corpus callosum, and also involving the cingulate gyri more so the right.  Minimal if any associated enhancement.  No associated hemorrhage or significant mass effect on adjacent structures. 2.  Top differential considerations for #1 include acute ACA / median artery corpus callosum infarcts (favored, see neck MRA findings below) versus a primary brain tumor simulating a stroke (moderate to high-grade glioma). 3.  In light of #2, recommend imaging surveillance (e.g. 6 - 8 weeks) to confirm expected post ischemic evolution. 4.  Otherwise mild for age nonspecific white matter signal changes.  Study discussed by telephone with Dr. Daleen Bo on 02/20/2013 at 2330 hours.  MRA HEAD  WITHOUT CONTRAST  Technique: Angiographic images of the Circle of Willis were obtained using MRA technique without  intravenous contrast.  Findings:  Degraded by a combination of MOTSA and motion artifact.  Antegrade flow in the posterior circulation with relatively codominant distal vertebral arteries.  Mild to moderate distal vertebral artery irregularity, greater on the left.  Moderate stenosis of the left V4 segment proximal to the left PICA origin. Right PICA origin is patent.  Vertebrobasilar junction is patent, but the basilar artery is irregular with a moderate to severe mid basilar stenosis.  The basilar tip is patent.  SCA origins are patent.  Fetal type bilateral PCA origins, more so the left, with evidence of superimposed P1 segment atherosclerosis.  Bilateral PCA branches are within normal limits.  Antegrade flow in both ICA siphons with ICA dolichoectasia, mild irregularity, and no stenosis.  Ophthalmic and posterior communicating artery origins are within normal limits.  Carotid termini are patent, but the  left A1 segment is dominant. The anterior communicating artery is present.  There is a median artery of the corpus callosum which it is difficult to visualize much beyond its origin.  The other ACA branches have more normal appearance.  Both MCA origins are within normal limits.  Bilateral MCA branches are within normal limits.  IMPRESSION: 1.  Intracranial MRA degraded by a combination of MOTSA and motion artifact. 2.  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 3.  However, see the neck MRA findings below, which also included these portions of the intracranial circulation. 4.  Dolichoectasia of the ICA siphons.  Dominant left ACA A1 segment.  MRA NECK WITHOUT AND WITH CONTRAST  Technique:  Angiographic images of the neck were obtained using MRA technique without and with intravenous contrast.  Carotid stenosis  measurements (when applicable) are obtained utilizing NASCET criteria, using the distal internal carotid diameter as the denominator.  Findings:  Precontrast time-of-flight images of the neck demonstrate antegrade flow in both cervical carotid and vertebral arteries. Normal time-of-flight appearance of the carotid bifurcations.  Antegrade flow continues to the skull base.  Postcontrast images suggest a bovine arch configuration.  Great vessel origins are patent without significant stenosis.  The right common carotid artery is mildly tortuous.  There is mild to moderate irregularity at the posterior right ICA origin compatible with atherosclerotic plaque.  However, no hemodynamically significant cervical ICA stenosis occurs.  The visible right ICA siphon is mildly [place]:  The visible right ICA siphon is mildly dolichoectatic is seen on the intracranial exam. The right ACA A1 segment is diminutive.  The left common carotid artery is normal.  The left carotid bifurcation is normal.  The cervical left ICA is normal aside from mild tortuosity.  The left ICA siphon and dolichoectatic as seen on the intracranial study.  The left ICA terminus is patent along with the MCA and ACA origins.  The proximal ACA branches appear normal. There is a median artery of the corpus callosum which demonstrates irregularity and a 6 mm flow gap at the level of the body of the corpus callosum.  There does appear to be preserved distal flow.  No hemodynamically significant proximal subclavian artery stenosis is identified.  Both vertebral artery origins are within normal limits.  Proximal left vertebral arteries tortuous.  The cervical vertebral arteries are relatively codominant and within normal limits throughout the neck.  Intracranial vertebral arteries are mildly irregular, more so the left.  There does not appear to be hemodynamically significant stenosis at the distal left vertebral artery or vertebrobasilar junction.  There is  irregularity of the mid basilar artery, the these images suggest at most moderate stenosis.  IMPRESSION: 1.  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  This supports the ischemic etiology very of the MRI abnormality. 2. The MRA images do not suggest a hemodynamically significant basilar artery stenosis.  Also, the distal vertebral arteries appear more normal than they did on the intracranial portion. 3.  Mild atherosclerosis at the right ICA origin without hemodynamically significant stenosis. 4.  Otherwise negative neck MRA.   Original Report Authenticated By: Roselyn Reef, M.D.   Mr Angiogram Neck W Wo Contrast  02/20/2013   *RADIOLOGY REPORT*  Clinical Data:  67 year old male with right side facial droop and slurred speech.  Difficulty with  balance.  Right side weakness.  Contrast: 80mL MULTIHANCE GADOBENATE DIMEGLUMINE 529 MG/ML IV SOLN  Comparison: None.  MRI HEAD WITHOUT AND WITH CONTRAST  Technique: Multiplanar, multiecho pulse sequences of the brain and surrounding structures were obtained according to standard protocol without and with intravenous contrast.  Findings:  There are sharply demarcated areas of restricted diffusion in the body of the corpus callosum and cingulate gyri, more so the right.  These are associated with T2 and FLAIR hyperintensity plus expansion of the corpus callosum.  Postcontrast images show mild if any enhancement along the periphery of the abnormal signal.  The area of abnormality encompasses 44 x 26 x 13 mm (AP by transverse by CC).  No definite hemispheric white matter involvement.  No associated hemorrhage.  No other diffusion abnormality. Major intracranial vascular flow voids are preserved, with a degree of intracranial artery dolichoectasia.  See MRA findings below.  Superimposed mild for age scattered periventricular and subcortical cerebral white matter T2 and FLAIR  hyperintensity elsewhere.  No cortical encephalomalacia.  Deep gray matter nuclei, brainstem, cerebellum, pituitary, cervicomedullary junction and visualized cervical spine are within normal limits.  Visualized orbit soft tissues are within normal limits.  Visualized paranasal sinuses and mastoids are clear.  Grossly normal visualized internal auditory structures. Visualized bone marrow signal is within normal limits.  Negative scalp soft tissues.  IMPRESSION: 1.  Abnormal mass-like signal with restricted diffusion in the body of the corpus callosum, and also involving the cingulate gyri more so the right.  Minimal if any associated enhancement.  No associated hemorrhage or significant mass effect on adjacent structures. 2.  Top differential considerations for #1 include acute ACA / median artery corpus callosum infarcts (favored, see neck MRA findings below) versus a primary brain tumor simulating a stroke (moderate to high-grade glioma). 3.  In light of #2, recommend imaging surveillance (e.g. 6 - 8 weeks) to confirm expected post ischemic evolution. 4.  Otherwise mild for age nonspecific white matter signal changes.  Study discussed by telephone with Dr. Daleen Bo on 02/20/2013 at 2330 hours.  MRA HEAD WITHOUT CONTRAST  Technique: Angiographic images of the Circle of Willis were obtained using MRA technique without  intravenous contrast.  Findings:  Degraded by a combination of MOTSA and motion artifact.  Antegrade flow in the posterior circulation with relatively codominant distal vertebral arteries.  Mild to moderate distal vertebral artery irregularity, greater on the left.  Moderate stenosis of the left V4 segment proximal to the left PICA origin. Right PICA origin is patent.  Vertebrobasilar junction is patent, but the basilar artery is irregular with a moderate to severe mid basilar stenosis.  The basilar tip is patent.  SCA origins are patent.  Fetal type bilateral PCA origins, more so the left, with  evidence of superimposed P1 segment atherosclerosis.  Bilateral PCA branches are within normal limits.  Antegrade flow in both ICA siphons with ICA dolichoectasia, mild irregularity, and no stenosis.  Ophthalmic and posterior communicating artery origins are within normal limits.  Carotid termini are patent, but the left A1 segment is dominant. The anterior communicating artery is present.  There is a median artery of the corpus callosum which it is difficult to visualize much beyond its origin.  The other ACA branches have more normal appearance.  Both MCA origins are within normal limits.  Bilateral MCA branches are within normal limits.  IMPRESSION: 1.  Intracranial MRA degraded by a combination of MOTSA and motion artifact. 2.  Suggestion of hemodynamically significant  stenoses of the mid basilar artery, and also a median artery of the corpus callosum. 3.  However, see the neck MRA findings below, which also included these portions of the intracranial circulation. 4.  Dolichoectasia of the ICA siphons.  Dominant left ACA A1 segment.  MRA NECK WITHOUT AND WITH CONTRAST  Technique:  Angiographic images of the neck were obtained using MRA technique without and with intravenous contrast.  Carotid stenosis measurements (when applicable) are obtained utilizing NASCET criteria, using the distal internal carotid diameter as the denominator.  Findings:  Precontrast time-of-flight images of the neck demonstrate antegrade flow in both cervical carotid and vertebral arteries. Normal time-of-flight appearance of the carotid bifurcations.  Antegrade flow continues to the skull base.  Postcontrast images suggest a bovine arch configuration.  Great vessel origins are patent without significant stenosis.  The right common carotid artery is mildly tortuous.  There is mild to moderate irregularity at the posterior right ICA origin compatible with atherosclerotic plaque.  However, no hemodynamically significant cervical ICA  stenosis occurs.  The visible right ICA siphon is mildly [place]:  The visible right ICA siphon is mildly dolichoectatic is seen on the intracranial exam. The right ACA A1 segment is diminutive.  The left common carotid artery is normal.  The left carotid bifurcation is normal.  The cervical left ICA is normal aside from mild tortuosity.  The left ICA siphon and dolichoectatic as seen on the intracranial study.  The left ICA terminus is patent along with the MCA and ACA origins.  The proximal ACA branches appear normal. There is a median artery of the corpus callosum which demonstrates irregularity and a 6 mm flow gap at the level of the body of the corpus callosum.  There does appear to be preserved distal flow.  No hemodynamically significant proximal subclavian artery stenosis is identified.  Both vertebral artery origins are within normal limits.  Proximal left vertebral arteries tortuous.  The cervical vertebral arteries are relatively codominant and within normal limits throughout the neck.  Intracranial vertebral arteries are mildly irregular, more so the left.  There does not appear to be hemodynamically significant stenosis at the distal left vertebral artery or vertebrobasilar junction.  There is irregularity of the mid basilar artery, the these images suggest at most moderate stenosis.  IMPRESSION: 1.  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  This supports the ischemic etiology very of the MRI abnormality. 2. The MRA images do not suggest a hemodynamically significant basilar artery stenosis.  Also, the distal vertebral arteries appear more normal than they did on the intracranial portion. 3.  Mild atherosclerosis at the right ICA origin without hemodynamically significant stenosis. 4.  Otherwise negative neck MRA.   Original Report Authenticated By: Roselyn Reef, M.D.   Mr [facility] Wo Contrast  02/20/2013    *RADIOLOGY REPORT*  Clinical Data:  67 year old male with right side facial droop and slurred speech.  Difficulty with balance.  Right side weakness.  Contrast: 10mL MULTIHANCE GADOBENATE DIMEGLUMINE 529 MG/ML IV SOLN  Comparison: None.  MRI HEAD WITHOUT AND WITH CONTRAST  Technique: Multiplanar, multiecho pulse sequences of the brain and surrounding structures were obtained according to standard protocol without and with intravenous contrast.  Findings:  There are sharply demarcated areas of restricted diffusion in the body of the corpus callosum and cingulate gyri, more so the right.  These are associated with  T2 and FLAIR hyperintensity plus expansion of the corpus callosum.  Postcontrast images show mild if any enhancement along the periphery of the abnormal signal.  The area of abnormality encompasses 44 x 26 x 13 mm (AP by transverse by CC).  No definite hemispheric white matter involvement.  No associated hemorrhage.  No other diffusion abnormality. Major intracranial vascular flow voids are preserved, with a degree of intracranial artery dolichoectasia.  See MRA findings below.  Superimposed mild for age scattered periventricular and subcortical cerebral white matter T2 and FLAIR hyperintensity elsewhere.  No cortical encephalomalacia.  Deep gray matter nuclei, brainstem, cerebellum, pituitary, cervicomedullary junction and visualized cervical spine are within normal limits.  Visualized orbit soft tissues are within normal limits.  Visualized paranasal sinuses and mastoids are clear.  Grossly normal visualized internal auditory structures. Visualized bone marrow signal is within normal limits.  Negative scalp soft tissues.  IMPRESSION: 1.  Abnormal mass-like signal with restricted diffusion in the body of the corpus callosum, and also involving the cingulate gyri more so the right.  Minimal if any associated enhancement.  No associated hemorrhage or significant mass effect on adjacent structures. 2.  Top  differential considerations for #1 include acute ACA / median artery corpus callosum infarcts (favored, see neck MRA findings below) versus a primary brain tumor simulating a stroke (moderate to high-grade glioma). 3.  In light of #2, recommend imaging surveillance (e.g. 6 - 8 weeks) to confirm expected post ischemic evolution. 4.  Otherwise mild for age nonspecific white matter signal changes.  Study discussed by telephone with Dr. Daleen Bo on 02/20/2013 at 2330 hours.  MRA HEAD WITHOUT CONTRAST  Technique: Angiographic images of the Circle of Willis were obtained using MRA technique without  intravenous contrast.  Findings:  Degraded by a combination of MOTSA and motion artifact.  Antegrade flow in the posterior circulation with relatively codominant distal vertebral arteries.  Mild to moderate distal vertebral artery irregularity, greater on the left.  Moderate stenosis of the left V4 segment proximal to the left PICA origin. Right PICA origin is patent.  Vertebrobasilar junction is patent, but the basilar artery is irregular with a moderate to severe mid basilar stenosis.  The basilar tip is patent.  SCA origins are patent.  Fetal type bilateral PCA origins, more so the left, with evidence of superimposed P1 segment atherosclerosis.  Bilateral PCA branches are within normal limits.  Antegrade flow in both ICA siphons with ICA dolichoectasia, mild irregularity, and no stenosis.  Ophthalmic and posterior communicating artery origins are within normal limits.  Carotid termini are patent, but the left A1 segment is dominant. The anterior communicating artery is present.  There is a median artery of the corpus callosum which it is difficult to visualize much beyond its origin.  The other ACA branches have more normal appearance.  Both MCA origins are within normal limits.  Bilateral MCA branches are within normal limits.  IMPRESSION: 1.  Intracranial MRA degraded by a combination of MOTSA and motion artifact. 2.   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 3.  However, see the neck MRA findings below, which also included these portions of the intracranial circulation. 4.  Dolichoectasia of the ICA siphons.  Dominant left ACA A1 segment.  MRA NECK WITHOUT AND WITH CONTRAST  Technique:  Angiographic images of the neck were obtained using MRA technique without and with intravenous contrast.  Carotid stenosis measurements (when applicable) are  obtained utilizing NASCET criteria, using the distal internal carotid diameter as the denominator.  Findings:  Precontrast time-of-flight images of the neck demonstrate antegrade flow in both cervical carotid and vertebral arteries. Normal time-of-flight appearance of the carotid bifurcations.  Antegrade flow continues to the skull base.  Postcontrast images suggest a bovine arch configuration.  Great vessel origins are patent without significant stenosis.  The right common carotid artery is mildly tortuous.  There is mild to moderate irregularity at the posterior right ICA origin compatible with atherosclerotic plaque.  However, no hemodynamically significant cervical ICA stenosis occurs.  The visible right ICA siphon is mildly [place]:  The visible right ICA siphon is mildly dolichoectatic is seen on the intracranial exam. The right ACA A1 segment is diminutive.  The left common carotid artery is normal.  The left carotid bifurcation is normal.  The cervical left ICA is normal aside from mild tortuosity.  The left ICA siphon and dolichoectatic as seen on the intracranial study.  The left ICA terminus is patent along with the MCA and ACA origins.  The proximal ACA branches appear normal. There is a median artery of the corpus callosum which demonstrates irregularity and a 6 mm flow gap at the level of the body of the corpus callosum.  There does appear to be preserved distal flow.  No hemodynamically  significant proximal subclavian artery stenosis is identified.  Both vertebral artery origins are within normal limits.  Proximal left vertebral arteries tortuous.  The cervical vertebral arteries are relatively codominant and within normal limits throughout the neck.  Intracranial vertebral arteries are mildly irregular, more so the left.  There does not appear to be hemodynamically significant stenosis at the distal left vertebral artery or vertebrobasilar junction.  There is irregularity of the mid basilar artery, the these images suggest at most moderate stenosis.  IMPRESSION: 1.  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  This supports the ischemic etiology very of the MRI abnormality. 2. The MRA images do not suggest a hemodynamically significant basilar artery stenosis.  Also, the distal vertebral arteries appear more normal than they did on the intracranial portion. 3.  Mild atherosclerosis at the right ICA origin without hemodynamically significant stenosis. 4.  Otherwise negative neck MRA.   Original Report Authenticated By: Roselyn Reef, M.D.    Scheduled Meds: . aspirin EC  81 mg Oral Daily  . carvedilol  25 mg Oral BID WC  . citalopram  20 mg Oral Daily  . cloNIDine  0.3 mg Transdermal Weekly  . insulin aspart  0-5 Units Subcutaneous QHS  . insulin aspart  0-9 Units Subcutaneous TID WC  . latanoprost  1 drop Left Eye QHS  . simvastatin  40 mg Oral QPM  . spironolactone  25 mg Oral Daily   Continuous Infusions: . sodium chloride 75 mL/hr at 02/21/13 [id number]    Principal Problem:   Acute CVA (cerebrovascular accident) Active Problems:   Abnormal brain scan   Dizziness   Weakness   Diabetes mellitus   Hypertension    Time spent: 35 minutes.    [name]  [facility] Pager [phone]  If 7PM-7AM, please contact night-coverage at [email or web address], password  [facility] 02/22/2013, 9:49 AM  LOS: 2 days

## 2013-02-22 NOTE — Evaluation (Addendum)
Physical Therapy Evaluation Patient Details Name: Cody Silva MRN: [id number] DOB: 12/28/1945 Today's Date: 02/22/2013 Time: [id number] PT Time Calculation (min): 18 min  PT Assessment / Plan / Recommendation Clinical Impression    Pt admitted with ischemic CVA vs mass at corpus collosum & cingulate gyrus.  He was independent with adls prior to admission and his wife recently had back surgery.. Pt currently with functional limitations due to the deficits listed below (see PT Problem List).  Pt will benefit from skilled PT to increase their independence and safety with mobility. Feel pt could benefit from CIR stay prior to return home since wife with recent back surgery.      PT Assessment  Patient needs continued PT services    Follow Up Recommendations  CIR    Does the patient have the potential to tolerate intense rehabilitation      Barriers to Discharge Decreased caregiver support (wife had back surgery recently )      Equipment Recommendations  Rolling walker with 5" wheels    Recommendations for Other Services     Frequency Min 4X/week    Precautions / Restrictions Precautions Precautions: Fall Restrictions Weight Bearing Restrictions: No   Pertinent Vitals/Pain No c/o's of pain.      Mobility  Bed Mobility Bed Mobility: Supine to Sit;Sitting - Scoot to Edge of Bed Supine to Sit: 4: Min assist;HOB elevated Sitting - Scoot to [employer] of Bed: 5: Supervision Transfers Sit to Stand: 4: Min assist;From bed;With upper extremity assist Stand to Sit: 4: Min guard;To chair/3-in-1;With armrests Details for Transfer Assistance: verbal cues for hand placement Ambulation/Gait Ambulation/Gait Assistance: 4: Min assist Ambulation Distance (Feet): 150 Feet Assistive device: Rolling walker Ambulation/Gait Assistance Details: verbal cues to incr step length and to look up. Gait Pattern: Step-through pattern;Decreased step length - right;Decreased step length -  left;Shuffle;Trunk flexed Gait velocity: decr General Gait Details: step length incr slightly as distance incr. Modified Rankin (Stroke Patients Only) Pre-Morbid Rankin Score: No symptoms Modified Rankin: Moderately severe disability    Exercises     PT Diagnosis: Difficulty walking;Generalized weakness  PT Problem List: Decreased strength;Decreased activity tolerance;Decreased balance;Decreased mobility;Decreased knowledge of use of DME PT Treatment Interventions: DME instruction;Gait training;Patient/family education;Stair training;Functional mobility training;Therapeutic activities;Therapeutic exercise;Balance training   PT Goals Acute Rehab PT Goals PT Goal Formulation: With patient Time For Goal Achievement: 03/01/13 Potential to Achieve Goals: Good Pt will go Supine/Side to Sit: with modified independence PT Goal: Supine/Side to Sit - Progress: Goal set today Pt will go Sit to Supine/Side: with modified independence PT Goal: Sit to Supine/Side - Progress: Goal set today Pt will go Sit to Stand: with supervision PT Goal: Sit to Stand - Progress: Goal set today Pt will go Stand to Sit: with supervision PT Goal: Stand to Sit - Progress: Goal set today Pt will Ambulate: >150 feet;with supervision;with least restrictive assistive device PT Goal: Ambulate - Progress: Goal set today Pt will Go Up / Down Stairs: 6-9 stairs;with min assist;with rail(s) PT Goal: Up/Down Stairs - Progress: Goal set today  Visit Information  Last PT Received On: 02/22/13 Assistance Needed: +1    Subjective Data  Subjective: Pt states he feels weak. Patient Stated Goal: Be able to get up and get around.   Prior Functioning  Home Living Lives With: Spouse Available Help at Discharge: Family Type of Home: House Home Access: Stairs to enter [employer] of Steps: 2 Home Layout: Two level;Bed/bath upstairs;1/2 bath on main level Alternate Level Stairs-Number of  Steps: 1 flight Alternate  Level Stairs-Rails: Right Bathroom Shower/Tub: Walk-in shower;Tub/shower unit Bathroom Toilet: Handicapped height Home Adaptive Equipment:  (wife has 3:1 downstairs) Prior Function Level of Independence: Independent Able to Take Stairs?: Yes Driving: Yes Vocation: Part time employment Comments: works with [employer] Communication Communication: No difficulties Dominant Hand: Right    Cognition  Cognition Arousal/Alertness: Awake/alert Behavior During Therapy: WFL for tasks assessed/performed Overall Cognitive Status: Within Functional Limits for tasks assessed    Extremity/Trunk Assessment Right Upper Extremity Assessment RUE ROM/Strength/Tone: Within functional levels Left Upper Extremity Assessment LUE ROM/Strength/Tone: Within functional levels Right Lower Extremity Assessment RLE ROM/Strength/Tone: Deficits RLE ROM/Strength/Tone Deficits: grossly 4/5 Left Lower Extremity Assessment LLE ROM/Strength/Tone: Deficits LLE ROM/Strength/Tone Deficits: grossly 4-/5   Balance Balance Balance Assessed: Yes Static Standing Balance Static Standing - Balance Support: Left upper extremity supported Static Standing - Level of Assistance: 5: Stand by assistance Static Standing - Comment/# of Minutes: 2 minutes  End of Session PT - End of Session Equipment Utilized During Treatment: Gait belt Activity Tolerance: Patient tolerated treatment well Patient left: in chair;with call bell/phone within reach;with family/visitor present  GP     Ladora Osterberg 02/22/2013, 4:18 PM  [employer] PT [phone]

## 2013-02-23 LAB — GLUCOSE, CAPILLARY
Glucose-Capillary: 195 mg/dL — ABNORMAL HIGH (ref 70–99)
Glucose-Capillary: 222 mg/dL — ABNORMAL HIGH (ref 70–99)

## 2013-02-23 MED ORDER — HYDRALAZINE HCL 20 MG/ML IJ SOLN
10.0000 mg | Freq: Once | INTRAMUSCULAR | Status: AC
  Administered 2013-02-23: 10 mg via INTRAVENOUS
  Filled 2013-02-23: qty 1

## 2013-02-23 NOTE — Progress Notes (Signed)
[name]  Cody Silva [id number] DOB: [birth year] DOA: 02/20/2013 PCP: No primary provider on file.  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 -To make sure tumor is fully ruled out, repeat MRI in 8 weeks vs OP PET scan is recommended.  Acute CVA -ECHO/Dopplers WNL. -LDL 55. -ASA for secondary stroke prevention. -PT/OT is recommending CIR; consult requested.  HTN -Continue coreg, clonidine patch. -Allow permissive HTN. -BP was above 200 overnight requiring IV hydralazine. -Can consider adding another hypertensive agent if BP remains above 190.  DM -CBGs remain elevated. -Increase levemir to 10 units.  Code Status: Code Status Family Communication: Patient only Disposition Plan: CIR vs SNF   Consultants:  Neurology   Antibiotics:  None   Subjective: Sitting in chair, no complaints.  Objective: Filed Vitals:   02/23/13 0512 02/23/13 0602 02/23/13 0900 02/23/13 1055  BP: 185/89 168/86  153/86  Pulse: 73  78 73  Temp: 98.2 F (36.8 C)  98.3 F (36.8 C)   TempSrc: Oral  Oral   Resp: 20  20   Height:      Weight:      SpO2: 100%  96%     Intake/Output Summary (Last 24 hours) at 02/23/13 1213 Last data filed at 02/23/13 [id number]  Gross per 24 hour  Intake    440 ml  Output    300 ml  Net    140 ml   Filed Weights   02/21/13 0438  Weight: 106.4 kg (234 lb 9.1 oz)    Exam:   General:  AA OX3  Cardiovascular: RRR, no M/R/G  Respiratory: Lungs CTA B  Abdomen: S/NT/ND/+BS/no masses or organomegaly noted  Extremities: no C/C/E, +pulses.  Data Reviewed: Basic Metabolic Panel:  Recent Labs Lab 02/20/13 1702  NA 135  K 3.6  CL 96  CO2 30  GLUCOSE 201*  BUN 24*  CREATININE 1.52*  CALCIUM 9.3   Liver Function Tests: No results found for this basename: AST, ALT, ALKPHOS, BILITOT, PROT, ALBUMIN,  in the last 168  hours No results found for this basename: LIPASE, AMYLASE,  in the last 168 hours No results found for this basename: AMMONIA,  in the last 168 hours CBC:  Recent Labs Lab 02/20/13 1702  WBC 5.8  NEUTROABS 4.1  HGB 12.9*  HCT 35.8*  MCV 84.2  PLT 161   Cardiac Enzymes: No results found for this basename: CKTOTAL, CKMB, CKMBINDEX, TROPONINI,  in the last 168 hours BNP (last 3 results) No results found for this basename: PROBNP,  in the last 8760 hours CBG:  Recent Labs Lab 02/22/13 1156 02/22/13 1640 02/22/13 2135 02/23/13 0640 02/23/13 1136  GLUCAP 221* 217* 207* 195* 222*    Recent Results (from the past 240 hour(s))  URINE CULTURE     Status: None   Collection Time    02/20/13  7:20 PM      Result Value Range Status   Specimen Description URINE, CLEAN CATCH   Final   Special Requests NONE   Final   Culture  Setup Time 02/20/2013 20:48   Final   Colony Count NO GROWTH   Final   Culture NO GROWTH   Final   Report Status 02/21/2013 FINAL   Final     Studies: No results found.  Scheduled Meds: . aspirin  325 mg Oral Daily  . carvedilol  25 mg Oral BID WC  . citalopram  20 mg Oral Daily  . cloNIDine  0.3 mg Transdermal Weekly  . insulin aspart  0-5 Units Subcutaneous QHS  . insulin aspart  0-9 Units Subcutaneous TID WC  . latanoprost  1 drop Left Eye QHS  . simvastatin  40 mg Oral QPM  . spironolactone  25 mg Oral Daily   Continuous Infusions: . sodium chloride 75 mL/hr at 02/21/13 [id number]    Principal Problem:   Acute CVA (cerebrovascular accident) Active Problems:   Abnormal brain scan   Dizziness   Weakness   Diabetes mellitus   Hypertension    Time spent: 25 minutes.    Cody Silva  [facility] Pager [phone]  If 7PM-7AM, please contact night-coverage at [email or web address], password [email or web address] 02/23/2013, 12:13 PM  LOS: 3 days

## 2013-02-23 NOTE — Progress Notes (Signed)
History:  Cody Silva is a 67 y.o. male with a history of hypertension, hyperlipidemia and diabetes mellitus, diabetic neuropathy and diabetic retinopathy, who came to the emergency room 02/21/2013 following a recurrent spell of acute weakness with dizziness tendency to collapse toward the right side but with generalized weakness. Patient had been experiencing spells of this type for several months. There was associated slurred speech as well. Patient was aware of his surroundings and able to communicate with those around him coherently. No convulsive type movements had been described. An MRI of his brain was obtained which showed an area of abnormal signal involving the body of the corpus callosum as well as cingulate gyrus, right greater than left. It's unclear whether this lesion was vascular in nature or possibly neoplastic. Decreased flow in the median artery at the corpus callosum was noted on MRA. Significance is unclear. There was minimal, if any contrast-enhancement.  Subjective: There are no family members present this morning. The patient is without complaints. He appears much brighter today.  Objective: BP 168/86  Pulse 73  Temp(Src) 98.2 F (36.8 C) (Oral)  Resp 20  Ht 6\' 4"  (1.93 m)  Wt 106.4 kg (234 lb 9.1 oz)  BMI 28.56 kg/m2  SpO2 100%  CBGs  Recent Labs  02/21/13 0616 02/21/13 1150 02/21/13 1627 02/21/13 2101 02/22/13 0634 02/22/13 1156 02/22/13 1640 02/22/13 2135 02/23/13 0640  GLUCAP 170* 199* 265* 166* 180* 221* 217* 207* 195*   Past Medical History  Diagnosis Date  . Hypertension   . Diabetes mellitus without complication   . Diabetic retinopathy   . Diabetic neuropathy      Medications: Scheduled: . aspirin  325 mg Oral Daily  . carvedilol  25 mg Oral BID WC  . citalopram  20 mg Oral Daily  . cloNIDine  0.3 mg Transdermal Weekly  . insulin aspart  0-5 Units Subcutaneous QHS  . insulin aspart  0-9 Units Subcutaneous TID WC  . latanoprost  1  drop Left Eye QHS  . simvastatin  40 mg Oral QPM  . spironolactone  25 mg Oral Daily    Neurologic Exam: Mental Status: Alert, oriented, thought content appropriate.  Speech fluent without evidence of aphasia. Able to follow 3 step commands without difficulty. Cranial Nerves: II- Visual fields grossly intact. III/IV/VI-Extraocular movements intact.  Pupils reactive bilaterally. V/VII-Smile symmetric, no facial weakness VIII-hearing grossly intact IX/X-normal gag XI-bilateral shoulder shrug XII-midline tongue extension Motor: 4/5 weakness hand grip on left. 4/5 left foot dorsiflexion. 5/5 right. Sensory: Light touch intact throughout, bilaterally Deep Tendon Reflexes: 2+ and symmetric throughout Plantars: equivocal Cerebellar: right arm tremor   Lab Results: CBC:  Recent Labs Lab 02/20/13 1702  WBC 5.8  NEUTROABS 4.1  HGB 12.9*  HCT 35.8*  MCV 84.2  PLT [id number]   Basic Metabolic Panel:  Recent Labs Lab 02/20/13 1702  NA 135  K 3.6  CL 96  CO2 30  GLUCOSE 201*  BUN 24*  CREATININE 1.52*  CALCIUM 9.3   Liver Function Tests: No results found for this basename: AST, ALT, ALKPHOS, BILITOT, PROT, ALBUMIN,  in the last 168 hours Hemoglobin A1C:  Recent Labs Lab 02/21/13 0520  HGBA1C 7.5*   Fasting Lipid Panel:  Recent Labs Lab 02/21/13 0520  CHOL 134  HDL 61  LDLCALC 55  TRIG 90  CHOLHDL 2.2   Thyroid Function Tests: No results found for this basename: TSH, T4TOTAL, FREET4, T3FREE, THYROIDAB,  in the last 168 hours Coagulation: No results found  for this basename: LABPROT, INR,  in the last 168 hours   Study Results:  Mr Virgel Paling Wo Contrast 02/20/2013  1.  Abnormal mass-like signal with restricted diffusion in the body of the corpus callosum, and also involving the cingulate gyri more so the right.  Minimal if any associated enhancement.  No associated hemorrhage or significant mass effect on adjacent structures. 2.  Top differential considerations  for #1 include acute ACA / median artery corpus callosum infarcts (favored, see neck MRA findings below) versus a primary brain tumor simulating a stroke (moderate to high-grade glioma). 3.  In light of #2, recommend imaging surveillance (e.g. 6 - 8 weeks) to confirm expected post ischemic evolution. 4.  Otherwise mild for age nonspecific white matter signal changes.  Study discussed by telephone with Dr. Daleen Bo on 02/20/2013 at 2330 hours.    MRA HEAD WITHOUT CONTRAST   1.  Intracranial MRA degraded by a combination of MOTSA and motion artifact. 2.  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    MRA NECK WITHOUT AND WITH CONTRAST  1.  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  This supports the ischemic etiology very of the MRI abnormality. 2. The MRA images do not suggest a hemodynamically significant basilar artery stenosis.  Also, the distal vertebral arteries appear more normal than they did on the intracranial portion. 3.  Mild atherosclerosis at the right ICA origin without hemodynamically significant stenosis. 4.  Otherwise negative neck MRA.   Original Report Authenticated By: Roselyn Reef, M.D.    Vascular Ultrasound  Carotid Duplex (Doppler) has been completed. Preliminary findings: Bilateral: Less than 39% ICA stenosis. Vertebral artery flow is antegrade  2-D echo - ejection fraction 50-55%. No obvious cardiac source of emboli.  EEG -  interpreted as normal.  Therapists - are recommending inpatient rehabilitation.  Assessment  67yo male with progressive symptoms of dizziness, generalized weakness, listing to right side over the past few months. MRI abnormal with concern for mass lesion vs ischemic event of corpus callosum and cingulate gyrus. Workup is complete.    Hypertension - has required  intravenous hydralazine.  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  EEG - normal  Plan   PET scan to be done on an outpatient basis.  hgb a1c 7.5 - goal less than 7.  Therapists recommended inpatient rehabilitation - A. consult has been placed.  Risk factor modification  Increased aspirin to 325 mg daily.   Mikey Bussing PA-C [facility] Pager [phone] 02/23/2013, 8:12 AM   I have personally obtained a history, examined the patient, evaluated imaging results, and formulated the assessment and plan of care. I agree with the above.   -S/p EEG - The corpus callosum on imaging appears to be an infarct due to hyperdensity on diffusion and hypodensity on ADC. There does not appear to be any edema and no enhancement on imaging.  On the other hand pt's symptoms started 6 weeks ago, it is rare to see positive diffusion imaging for 6 weeks on MRI which are usually positive up to 3 weeks. This could represent evolution/new infarct.  - Agree repeating MRI with gad or  if possible PET scan as out pt.    Pt states feeling better today, less ataxic. Most likely will require in pt rehab.  Leotis Pain

## 2013-02-24 DIAGNOSIS — I6330 Cerebral infarction due to thrombosis of unspecified cerebral artery: Secondary | ICD-10-CM

## 2013-02-24 LAB — GLUCOSE, CAPILLARY
Glucose-Capillary: 224 mg/dL — ABNORMAL HIGH (ref 70–99)
Glucose-Capillary: 248 mg/dL — ABNORMAL HIGH (ref 70–99)

## 2013-02-24 MED ORDER — HYDRALAZINE HCL 20 MG/ML IJ SOLN
10.0000 mg | Freq: Once | INTRAMUSCULAR | Status: AC
  Administered 2013-02-24: 10 mg via INTRAVENOUS
  Filled 2013-02-24: qty 1

## 2013-02-24 MED ORDER — ASPIRIN EC 81 MG PO TBEC: 81.0000 mg | DELAYED_RELEASE_TABLET | Freq: Every day | ORAL | Status: DC

## 2013-02-24 MED ORDER — CLOPIDOGREL BISULFATE 75 MG PO TABS: 75.0000 mg | ORAL_TABLET | Freq: Every day | ORAL | Status: DC

## 2013-02-24 MED ORDER — ASPIRIN 81 MG PO TBEC: 81.0000 mg | DELAYED_RELEASE_TABLET | Freq: Every day | ORAL | Status: DC

## 2013-02-24 MED ORDER — ASPIRIN 325 MG PO TABS: 325.0000 mg | ORAL_TABLET | Freq: Every day | ORAL | Status: DC

## 2013-02-24 MED ORDER — CLOPIDOGREL BISULFATE 75 MG PO TABS
75.0000 mg | ORAL_TABLET | Freq: Every day | ORAL | Status: DC
  Administered 2013-02-24: 75 mg via ORAL
  Filled 2013-02-24: qty 1

## 2013-02-24 NOTE — Care Management Note (Unsigned)
    Page 1 of 2   02/24/2013     2:02:16 PM   CARE MANAGEMENT NOTE 02/24/2013  Patient:  Cody Silva, Cody Silva   Account Number:  [id number]  Date Initiated:  02/24/2013  Documentation initiated by:  Lorne Skeens  Subjective/Objective Assessment:   Patient admitted with acute paraplegia.  Lives at home with wife.     Action/Plan:   Will follow for discharge needs.   Anticipated DC Date:  02/24/2013   Anticipated DC Plan:  [place]  CM consult      Choice offered to / List presented to:  C-1 Patient   DME arranged  [facility] BED      DME agency  Caroline arranged  [place].   Status of service:  Completed, signed off Medicare Important Message given?   (If response is "NO", the following Medicare IM given date fields will be blank) Date Medicare IM given:   Date Additional Medicare IM given:    Discharge Disposition:  [place]  Per UR Regulation:  Reviewed for med. necessity/level of care/duration of stay  If discussed at [place] of Stay Meetings, dates discussed:    Comments:  02/24/13 1200 Met with patient and wife to discuss home health needs.  Pt chose [facility] for PT/OT.  Message sent to Dr Jerilee Hoh regarding patient's request for a hospital bed at home.  Marie with [facility] notiified of referral.

## 2013-02-24 NOTE — Progress Notes (Signed)
History:  Cody Silva is a 67 y.o. male with a history of hypertension, hyperlipidemia and diabetes mellitus, diabetic neuropathy and diabetic retinopathy, who came to the emergency room 02/21/2013 following a recurrent spell of acute weakness with dizziness tendency to collapse toward the right side but with generalized weakness. Patient had been experiencing spells of this type for several months. There was associated slurred speech as well. Patient was aware of his surroundings and able to communicate with those around him coherently. No convulsive type movements had been described. An MRI of his brain was obtained which showed an area of abnormal signal involving the body of the corpus callosum as well as cingulate gyrus, right greater than left. It's unclear whether this lesion was vascular in nature or possibly neoplastic. Decreased flow in the median artery at the corpus callosum was noted on MRA. Significance is unclear. There was minimal, if any contrast-enhancement.  Subjective: Family at the bedside. Patient sitting on the edge of the bed.   Objective: BP 163/68  Pulse 84  Temp(Src) 98.2 F (36.8 C) (Oral)  Resp 18  Ht 6\' 4"  (1.93 m)  Wt 106.4 kg (234 lb 9.1 oz)  BMI 28.56 kg/m2  SpO2 100%  CBGs  Recent Labs  02/22/13 0634 02/22/13 1156 02/22/13 1640 02/22/13 2135 02/23/13 0640 02/23/13 1136 02/23/13 1630 02/23/13 2032 02/24/13 0531 02/24/13 1137  GLUCAP 180* 221* 217* 207* 195* 222* 237* 239* 224* 248*   Past Medical History  Diagnosis Date  . Hypertension   . Diabetes mellitus without complication   . Diabetic retinopathy   . Diabetic neuropathy    Medications: Scheduled: . aspirin  325 mg Oral Daily  . carvedilol  25 mg Oral BID WC  . citalopram  20 mg Oral Daily  . cloNIDine  0.3 mg Transdermal Weekly  . insulin aspart  0-5 Units Subcutaneous QHS  . insulin aspart  0-9 Units Subcutaneous TID WC  . latanoprost  1 drop Left Eye QHS  . simvastatin   40 mg Oral QPM  . spironolactone  25 mg Oral Daily    Neurologic Exam: Mental Status: Alert, oriented, thought content appropriate.  Speech fluent without evidence of aphasia. Able to follow 3 step commands without difficulty. Cranial Nerves: II- Visual fields grossly intact. III/IV/VI-Extraocular movements intact.  Pupils reactive bilaterally. V/VII-Smile symmetric, no facial weakness VIII-hearing grossly intact IX/X-normal gag XI-bilateral shoulder shrug XII-midline tongue extension Motor: BUE 5, BLE 4.  Sensory: Light touch intact throughout, bilaterally Deep Tendon Reflexes: 2+ and symmetric throughout Plantars: equivocal Cerebellar: right arm tremor   Lab Results: CBC:  Recent Labs Lab 02/20/13 1702  WBC 5.8  NEUTROABS 4.1  HGB 12.9*  HCT 35.8*  MCV 84.2  PLT [id number]   Basic Metabolic Panel:  Recent Labs Lab 02/20/13 1702  NA 135  K 3.6  CL 96  CO2 30  GLUCOSE 201*  BUN 24*  CREATININE 1.52*  CALCIUM 9.3   Liver Function Tests: No results found for this basename: AST, ALT, ALKPHOS, BILITOT, PROT, ALBUMIN,  in the last 168 hours Hemoglobin A1C:  Recent Labs Lab 02/21/13 0520  HGBA1C 7.5*   Fasting Lipid Panel:  Recent Labs Lab 02/21/13 0520  CHOL 134  HDL 61  LDLCALC 55  TRIG 90  CHOLHDL 2.2   Thyroid Function Tests: No results found for this basename: TSH, T4TOTAL, FREET4, T3FREE, THYROIDAB,  in the last 168 hours Coagulation: No results found for this basename: LABPROT, INR,  in the last 168 hours  Study Results:  Mr Milagros Loll Wo Contrast 02/20/2013  1.  Abnormal mass-like signal with restricted diffusion in the body of the corpus callosum, and also involving the cingulate gyri more so the right.  Minimal if any associated enhancement.  No associated hemorrhage or significant mass effect on adjacent structures. 2.  Top differential considerations for #1 include acute ACA / median artery corpus callosum infarcts (favored, see neck MRA  findings below) versus a primary brain tumor simulating a stroke (moderate to high-grade glioma). 3.  In light of #2, recommend imaging surveillance (e.g. 6 - 8 weeks) to confirm expected post ischemic evolution. 4.  Otherwise mild for age nonspecific white matter signal changes.  Study discussed by telephone with Dr. Daleen Bo on 02/20/2013 at 2330 hours.    MRA HEAD WITHOUT CONTRAST   1.  Intracranial MRA degraded by a combination of MOTSA and motion artifact. 2.  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    MRA NECK WITHOUT AND WITH CONTRAST  1.  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  This supports the ischemic etiology very of the MRI abnormality. 2. The MRA images do not suggest a hemodynamically significant basilar artery stenosis.  Also, the distal vertebral arteries appear more normal than they did on the intracranial portion. 3.  Mild atherosclerosis at the right ICA origin without hemodynamically significant stenosis. 4.  Otherwise negative neck MRA.   Original Report Authenticated By: Roselyn Reef, M.D.   Vascular Ultrasound  Carotid Duplex (Doppler) has been completed. Preliminary findings: Bilateral: Less than 39% ICA stenosis. Vertebral artery flow is antegrade  2-D echo - ejection fraction 50-55%. No obvious cardiac source of emboli.  EEG -  interpreted as normal.  Therapists - are recommending inpatient rehabilitation.  Assessment 67yo male with progressive symptoms of dizziness, generalized weakness, listing to right side over the past few months. MRI completed. 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 Workup is complete.    Hypertension - has  required intravenous hydralazine.  Diabetes mellitus without complication. hgb [id number] 7.5 - goal less than 7.  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  EEG - normal  Plan  Given intracranial atherosclerosis, recommend Aspirin 81 mg and plavix 75 mg daily x 3 months then plavix alone (I adjust d/c orders to reflect this)  Recommend performing a repeat MRI in 4-6 weeke to evalute stroke progression, possibility of tumor  Do not recommend PET scan at this time. Can consider if needed in the future.   Too high level for inpatient rehabilitation - outpatient therapy perferred from stroke team standpoint. Home health only if transportation an issue.   Ongoing risk factor modification  Follow up stroke clinic in 2 months  SHARON BIBY, MSN, RN, ANVP-BC, ANP-BC, [facility] Pager: [phone] 02/24/2013 1:47 PM  I have personally obtained a history, examined the patient, evaluated imaging results, and formulated the assessment and plan of care. I agree with the above. Suspect stroke related to intracranial atherosclerosis rather than tumor. Agree with repeat imaging to confirm.  Penni Bombard, MD [id number], [id number] PM Certified in Neurology, Neurophysiology and Neuroimaging [facility] - Stroke Team  Please refer to [email or web address] for on-call Stroke MD

## 2013-02-24 NOTE — Progress Notes (Signed)
Inpatient Diabetes Program Recommendations  AACE/ADA: New Consensus Statement on Inpatient Glycemic Control (2013)  Target Ranges:  Prepandial:   less than 140 mg/dL      Peak postprandial:   less than 180 mg/dL (1-2 hours)      Critically ill patients:  140 - 180 mg/dL  Results for Cody Silva, Cody Silva (MRN [id number]) as of 02/24/2013 13:21  Ref. Range 02/20/2013 17:02 02/21/2013 05:20  Hemoglobin A1C Latest Range: <5.7 %  7.5 (H)  Glucose Latest Range: 70-99 mg/dL 201 (H)   Results for Cody Silva, Cody Silva (MRN [id number]) as of 02/24/2013 13:21  Ref. Range 02/23/2013 11:36 02/23/2013 16:30 02/23/2013 20:32 02/24/2013 05:31 02/24/2013 11:37  Glucose-Capillary Latest Range: 70-99 mg/dL 222 (H) 237 (H) 239 (H) 224 (H) 248 (H)   Inpatient Diabetes Program Recommendations Insulin - Basal: If patient is not discharged today, please consider ordering low dose basal insulin; recommend Levemir 5 units QHS. Correction:  Please consider increasing Novolog correction to moderate scale. Oral Agents: Please consider addressing need for oral agents at time of discharge for outpatient glycemic control.  Note: Patient has a history of diabetes but according to the home medication list he is not on any medication at home for diabetes management.  Currently, patient is ordered to receive Novolog 0-9 units AC and Novolog 0-5 units HS for inpatient glycemic control.  Blood glucose over the past 24 hours has ranged from 222-248 mg/dl.  If patient is not discharged today, please order low dose basal insulin (recommend Levemir 5 units QHS) and increase Novolog correction to moderate scale.  At time of discharge, please consider addressing need for medication to manage diabetes.  Will continue to follow as an inpatient.  Thanks, Barnie Alderman, RN, MSN, CCRN Diabetes Coordinator Inpatient Diabetes Program [phone]

## 2013-02-24 NOTE — Progress Notes (Signed)
Occupational Therapy Treatment Patient Details Name: Cody FITZER MRN: [id number] DOB: November 15, 1945 Today's Date: 02/24/2013 Time: [id number] OT Time Calculation (min): 20 min  OT Assessment / Plan / Recommendation Comments on Treatment Session Pt is a 67 yo M with abnormality in corpus collosum. Pt with LOB during balance testing during functional tasks. Pt able to dress for home and simulate lower body bathing. Pt and family educated about risk of falls and safety. Important because wife had back surgery 2 weeks ago. Son will be available for assistance at home as well. Pt to discharge home with [place].    Follow Up Recommendations  Home health OT             Frequency Min 3X/week   Plan Discharge plan remains appropriate    Precautions / Restrictions Precautions Precautions: Fall Restrictions Weight Bearing Restrictions: No   Pertinent Vitals/Pain Pt did not report pain during session.    ADL  Lower Body Bathing: Min guard Where Assessed - Lower Body Bathing: Supported sit to stand (Pt with UE on sink counter) Lower Body Dressing: Min guard Where Assessed - Lower Body Dressing: Supported sit to stand Equipment Used: Gait belt;Rolling walker Transfers/Ambulation Related to ADLs: Pt ambulated with min guard from bathroom, down hall, and back to room. Pt supervision for transfers. Pt with minor LOB (able to self correct) during ambulation. ADL Comments: Pt initially off balance when performing sit to stand.  Needs to be mod I.  Wife recently had back surgery    OT Goals Acute Rehab OT Goals OT Goal Formulation: With patient Time For Goal Achievement: 03/08/13 Potential to Achieve Goals: Good ADL Goals Pt Will Perform Lower Body Bathing: with supervision;Sit to stand from chair ADL Goal: Lower Body Bathing - Progress: Progressing toward goals Pt Will Perform Lower Body Dressing: with supervision;Sit to stand from chair ADL Goal: Lower Body Dressing - Progress: Progressing  toward goals Pt Will Transfer to Toilet: with supervision;Anterior-posterior transfer;3-in-1 Pt Will Perform Tub/Shower Transfer: Shower transfer;Ambulation;with supervision ADL Goal: Tub/Shower Transfer - Progress: Progressing toward goals  Visit Information  Last OT Received On: 02/24/13 Assistance Needed: +1          Cognition  Cognition Arousal/Alertness: Awake/alert Behavior During Therapy: WFL for tasks assessed/performed Overall Cognitive Status: Within Functional Limits for tasks assessed    Mobility  Bed Mobility Bed Mobility: Not assessed Transfers Transfers: Sit to Stand;Stand to Sit Sit to Stand: 5: Supervision;With upper extremity assist;From chair/3-in-1 Stand to Sit: 5: Supervision;With upper extremity assist;To bed Details for Transfer Assistance: Pt supervision for transfers       Balance Balance Balance Assessed: Yes Static Standing Balance Static Standing - Balance Support: Left upper extremity supported Static Standing - Level of Assistance: 5: Stand by assistance Static Standing - Comment/# of Minutes: Pt able to stand unassisted/unsupported for 10 sec with eyes closed Dynamic Standing Balance Dynamic Standing - Comments: Pt turned 360 degrees with LOB x1 with tactile cue for balance   End of Session OT - End of Session Equipment Utilized During Treatment: Gait belt;Other (comment) (RW) Activity Tolerance: Patient tolerated treatment well Patient left: in bed;with nursing in room;with family/visitor present;with call bell/phone within reach Nurse Communication: Other (comment) (In room)  GO     Hulda Humphrey 02/24/2013, 5:00 PM

## 2013-02-24 NOTE — Consult Note (Signed)
Physical Medicine and Rehabilitation Consult Reason for Consult: CVA Referring Physician: [facility]   HPI: Cody Silva is a 67 y.o. right-handed male with history of hypertension as well as diabetes mellitus with peripheral neuropathy. Admitted 02/21/2013 with intermittent slurred speech that has persisted over the last few months as well as blurred vision and fluctuating lower extremity weakness. By report patient has gone to the [facility] on 4 occasions to be evaluated with cranial CT scans but results were not made available. Most recent MRI of the brain shows abnormal masslike signal with restricted diffusion in the body of the corpus callosum. MRA of the head suggestion of hemodynamically significant stenosis of the mid basilar artery and also median artery of the corpus callosum. Echocardiogram ejection fraction [id number] grade 1 diastolic dysfunction. Carotid Dopplers with less than 39% ICA stenosis. Patient did not receive TPA. Neurology services consulted presently on aspirin therapy for stroke prophylaxis and workup ongoing question plan repeat MRI. Physical and occupational therapy evaluations completed with recommendations of physical medicine rehabilitation consult to consider inpatient rehabilitation services.  Review of Systems  Eyes: Positive for blurred vision.  Musculoskeletal: Positive for falls.  Neurological: Positive for speech change and weakness.  Psychiatric/Behavioral: Positive for depression.  All other systems reviewed and are negative.   Past Medical History  Diagnosis Date  . Hypertension   . Diabetes mellitus without complication   . Diabetic retinopathy   . Diabetic neuropathy    Past Surgical History  Procedure Laterality Date  . Eye surgery    . Arm surgery      Tumor removed    Family History  Problem Relation Age of Onset  . Diabetes Mother   . Breast cancer Mother   . Stroke Brother    Social History:  reports that he has never smoked. He  does not have any smokeless tobacco history on file. He reports that he does not drink alcohol or use illicit drugs. Allergies: No Known Allergies Medications Prior to Admission  Medication Sig Dispense Refill  . albuterol (PROVENTIL HFA;VENTOLIN HFA) 108 (90 BASE) MCG/ACT inhaler Inhale 2 puffs into the lungs every 6 (six) hours as needed for wheezing.      [name] aspirin EC 81 MG tablet Take 81 mg by mouth daily.      . Aspirin-Salicylamide-Caffeine (BC HEADACHE POWDER PO) Take 1 packet by mouth daily as needed (for headache).      . carvedilol (COREG) 25 MG tablet Take 25 mg by mouth 2 (two) times daily with a meal.      . citalopram (CELEXA) 40 MG tablet Take 20 mg by mouth daily.      . cloNIDine (CATAPRES - DOSED IN MG/24 HR) 0.3 mg/24hr Place 1 patch onto the skin once a week.      . fluocinonide cream (LIDEX) [id number] % Apply 1 application topically 2 (two) times daily.      . fluticasone (FLONASE) 50 MCG/ACT nasal spray Place 2 sprays into the nose daily.      [name] latanoprost (XALATAN) 0.005 % ophthalmic solution Place 1 drop into the left eye at bedtime.      [name] losartan (COZAAR) 100 MG tablet Take 100 mg by mouth daily.      . Multiple Vitamins-Minerals (MULTIVITAMIN PO) Take 1 tablet by mouth daily.      . polyvinyl alcohol (LIQUIFILM TEARS) 1.4 % ophthalmic solution Place 1 drop into both eyes 5 (five) times daily as needed (for dry eyes).      [name]  simvastatin (ZOCOR) 40 MG tablet Take 40 mg by mouth every evening.      . Skin Protectants, Misc. (EUCERIN) cream Apply 1 application topically as needed (for dry feet).      [name] spironolactone (ALDACTONE) 25 MG tablet Take 25 mg by mouth daily.        Home: Home Living Lives With: Spouse Available Help at Discharge: Family Type of Home: House Home Access: Stairs to enter Technical brewer of Steps: 2 Home Layout: Two level;Bed/bath upstairs;1/2 bath on main level Alternate Level Stairs-Number of Steps: 1 flight Alternate Level  Stairs-Rails: Right Bathroom Shower/Tub: Walk-in shower;Tub/shower unit Bathroom Toilet: Handicapped height Home Adaptive Equipment:  (wife has 3:1 downstairs)  Functional History: Prior Function Able to Take Stairs?: Yes Driving: Yes Vocation: Part time employment Comments: works with [employer] Functional Status:  Mobility: Bed Mobility Bed Mobility: Supine to Sit;Sitting - Scoot to Edge of Bed Supine to Sit: 4: Min assist;HOB elevated Sitting - Scoot to [employer] of Bed: 5: Supervision Transfers Sit to Stand: 4: Min assist;From bed;With upper extremity assist Stand to Sit: 4: Min guard;To chair/3-in-1;With armrests Ambulation/Gait Ambulation/Gait Assistance: 4: Min assist Ambulation Distance (Feet): 150 Feet Assistive device: Rolling walker Ambulation/Gait Assistance Details: verbal cues to incr step length and to look up. Gait Pattern: Step-through pattern;Decreased step length - right;Decreased step length - left;Shuffle;Trunk flexed Gait velocity: decr General Gait Details: step length incr slightly as distance incr.    ADL: ADL Grooming: Supervision/safety;Teeth care Where Assessed - Grooming: Supported standing Upper Body Bathing: Set up Where Assessed - Upper Body Bathing: Unsupported sitting Lower Body Bathing: Minimal assistance Where Assessed - Lower Body Bathing: Supported sit to stand Upper Body Dressing: Set up Where Assessed - Upper Body Dressing: Unsupported sitting Lower Body Dressing: Minimal assistance Where Assessed - Lower Body Dressing: Supported sit to stand Toilet Transfer: Simulated;Minimal assistance Toilet Transfer Method: Sit to stand (chair, bathroom sink, chair) Equipment Used: Gait belt;Rolling walker Transfers/Ambulation Related to ADLs: Pt ambulated to bathroom with min A and min A for sit to stand ADL Comments: Pt initially off balance when performing sit to stand.  Needs to be mod I.  Wife recently had back  surgery  Cognition: Cognition Overall Cognitive Status: Within Functional Limits for tasks assessed Arousal/Alertness: Awake/alert Orientation Level: Oriented X4 Memory: Appears intact Awareness: Appears intact Problem Solving: Appears intact Safety/Judgment: Appears intact Cognition Arousal/Alertness: Awake/alert Behavior During Therapy: WFL for tasks assessed/performed Overall Cognitive Status: Within Functional Limits for tasks assessed  Blood pressure 188/105, pulse 80, temperature 98 F (36.7 C), temperature source Oral, resp. rate 18, height 6\' 4"  (1.93 m), weight 106.4 kg (234 lb 9.1 oz), SpO2 95.00%. Physical Exam  Constitutional: He appears well-developed.  HENT:  Head: Normocephalic.  Eyes: EOM are normal.  Neck: Normal range of motion. Neck supple. No thyromegaly present.  Cardiovascular: Normal rate and regular rhythm.   Pulmonary/Chest: Effort normal and breath sounds normal. No respiratory distress.  Abdominal: Soft. Bowel sounds are normal. He exhibits no distension.  Musculoskeletal: He exhibits no edema.  Neurological: He is alert.  Speech is a bit dysarthric but intelligible. Patient was able to name person, place and date of birth. He follows simple commands. Bilateral UE intentional tremors. Mild left HP, pronator drift. No focal sensory loss.   Skin: Skin is warm and dry.  Psychiatric: He has a normal mood and affect.    Results for orders placed during the hospital encounter of 02/20/13 (from the past 24 hour(s))  GLUCOSE,  CAPILLARY     Status: Abnormal   Collection Time    02/23/13  6:40 AM      Result Value Range   Glucose-Capillary 195 (*) 70 - 99 mg/dL   Comment 1 Documented in Chart     Comment 2 Notify RN    GLUCOSE, CAPILLARY     Status: Abnormal   Collection Time    02/23/13 11:36 AM      Result Value Range   Glucose-Capillary 222 (*) 70 - 99 mg/dL   Comment 1 Notify RN    GLUCOSE, CAPILLARY     Status: Abnormal   Collection Time     02/23/13  4:30 PM      Result Value Range   Glucose-Capillary 237 (*) 70 - 99 mg/dL   Comment 1 Notify RN    GLUCOSE, CAPILLARY     Status: Abnormal   Collection Time    02/23/13  8:32 PM      Result Value Range   Glucose-Capillary 239 (*) 70 - 99 mg/dL   Comment 1 Notify RN     No results found.  Assessment/Plan: Diagnosis: corpus callosum, right cingulate gyrus infarct 1. Does the need for close, 24 hr/day medical supervision in concert with the patient's rehab needs make it unreasonable for this patient to be served in a less intensive setting? No 2. Co-Morbidities requiring supervision/potential complications: dm, htn 3. Due to bladder management, bowel management, safety, skin/wound care, disease management, medication administration, pain management and patient education, does the patient require 24 hr/day rehab nursing? Yes 4. Does the patient require coordinated care of a physician, rehab nurse, PT (1-2 hrs/day, 5 days/week) and OT (1-2 hrs/day, 5 days/week) to address physical and functional deficits in the context of the above medical diagnosis(es)? Yes Addressing deficits in the following areas: balance, endurance, locomotion, strength, transferring, bowel/bladder control, bathing, dressing, feeding, grooming and toileting 5. Can the patient actively participate in an intensive therapy program of at least 3 hrs of therapy per day at least 5 days per week? Yes 6. The potential for patient to make measurable gains while on inpatient rehab is fair 7. Anticipated functional outcomes upon discharge from inpatient rehab are n/a with PT, n/a with OT, n/a with SLP. 8. Estimated rehab length of stay to reach the above functional goals is: n/a 9. Does the patient have adequate social supports to accommodate these discharge functional goals? Yes 10. Anticipated D/C setting: Home 11. Anticipated post D/C treatments: [place] therapy 12. Overall Rehab/Functional Prognosis:  excellent  RECOMMENDATIONS: This patient's condition is appropriate for continued rehabilitative care in the following setting: [place] or outpt Patient has agreed to participate in recommended program. Yes Note that insurance prior authorization may be required for reimbursement for recommended care.  Comment: He's at a supervision to contact guard assist level currently.            Meredith Staggers, MD, [facility]     02/24/2013

## 2013-02-24 NOTE — Clinical Social Work Note (Signed)
CSW assessed pt at bedside along with pt wife, daughter, son and sister.  Pt has progressed and is no longer in need of SNF or CIR.  Pt will be going home with HH.  RNCM aware.  Nonnie Done, [place] [phone]  Clinical Social Work

## 2013-02-24 NOTE — Progress Notes (Signed)
Physical Therapy Treatment Patient Details Name: Cody Silva MRN: [id number] DOB: [birth year] Today's Date: 02/24/2013 Time: [id number] PT Time Calculation (min): 31 min  PT Assessment / Plan / Recommendation Comments on Treatment Session  tx session limited 2/2 lightheadedness and headache.  RN aware and to assess vitals and blood sugar.    Follow Up Recommendations  CIR     Does the patient have the potential to tolerate intense rehabilitation     Barriers to Discharge        Equipment Recommendations  Rolling walker with 5" wheels    Recommendations for Other Services    Frequency Min 4X/week   Plan Discharge plan remains appropriate;Frequency remains appropriate    Precautions / Restrictions Precautions Precautions: Fall Restrictions Weight Bearing Restrictions: No   Pertinent Vitals/Pain Reports headache, RN aware    Mobility  Bed Mobility Bed Mobility: Sit to Supine;Supine to Sit Supine to Sit: 6: Modified independent (Device/Increase time);HOB flat Sit to Supine: 5: Supervision;HOB flat Details for Bed Mobility Assistance: increased time needed Transfers Transfers: Sit to Stand;Stand to Sit Sit to Stand: 5: Supervision;From elevated surface;From bed Stand to Sit: 4: Min guard;To bed;With upper extremity assist Ambulation/Gait Ambulation/Gait Assistance: 4: Min guard Ambulation Distance (Feet): 75 Feet Assistive device: Rolling walker Ambulation/Gait Assistance Details: pt. very guarded and cautious with ambulation; reports increased lightheadedness today Gait Pattern: Step-to pattern Gait velocity: decreased Stairs: No Wheelchair Mobility Wheelchair Mobility: No Modified Rankin (Stroke Patients Only) Pre-Morbid Rankin Score: No symptoms Modified Rankin: Moderately severe disability    Exercises     PT Diagnosis:    PT Problem List:   PT Treatment Interventions:     PT Goals Acute Rehab PT Goals PT Goal Formulation: With patient Potential to  Achieve Goals: Good PT Goal: Supine/Side to Sit - Progress: Progressing toward goal PT Goal: Sit to Supine/Side - Progress: Progressing toward goal PT Goal: Sit to Stand - Progress: Progressing toward goal PT Goal: Stand to Sit - Progress: Progressing toward goal PT Goal: Ambulate - Progress: Progressing toward goal PT Goal: Up/Down Stairs - Progress: Progressing toward goal  Visit Information  Last PT Received On: 02/24/13 Assistance Needed: +1    Subjective Data  Subjective: Lightheaded with ambulation Patient Stated Goal: Be able to get up and get around.   Cognition  Cognition Arousal/Alertness: Awake/alert Behavior During Therapy: WFL for tasks assessed/performed Overall Cognitive Status: Within Functional Limits for tasks assessed    Balance     End of Session PT - End of Session Equipment Utilized During Treatment: Gait belt Activity Tolerance: Treatment limited secondary to medical complications (Comment) (reported increased lightheadedness, unable to continue) Patient left: in bed;with nursing in room;with family/visitor present;with call bell/phone within reach Nurse Communication: Mobility status   GP     Feltis, Jackqulyn Livings 02/24/2013, 10:07 AM Jackqulyn Livings. Feltis, PT, DPT [phone]

## 2013-02-24 NOTE — Discharge Summary (Signed)
Physician Discharge Summary  Cody Silva [id number] DOB: [birth year] DOA: 02/20/2013  PCP: No primary provider on file.  Admit date: 02/20/2013 Discharge date: 02/24/2013  Time spent: Greater than 30 minutes  Recommendations for Outpatient Follow-up:  -Follow up with PCP in 2 weeks. -Will need repeat MRI in 8 weeks (see below for details).   Discharge Diagnoses:  Principal Problem:   Acute CVA (cerebrovascular accident) Active Problems:   Abnormal brain scan   Dizziness   Weakness   Diabetes mellitus   Hypertension   Discharge Condition: Stable and improved.  Filed Weights   02/21/13 0438  Weight: 106.4 kg (234 lb 9.1 oz)    History of present illness:  Patient is a 67 y/o man who presented with :3 month hx of intermitted slurred speech, he has occasional episodes of both legs going weak, he had one episode of syncopal even few months ago. The weakness fluctuates and switches sides. He has occasional blurred vision but not double vision. This episoseds have been becoming more frequent. Wife also noted some Left facial droop as well for the past 1 week. He has gone to [place] on 4 occasions to have this evaluated. He have had a few CT's but not a recent MRI. He reportedly had an echogram done last Friday. He does report tinnitus in right ear and had a hearing test done for that. Today patient had 2 episodes of falling at which point family brought him to ER. 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 We were asked to admit him for further evaluation and management.   Hospital Course:   Abnormal MRI Brain  -Seems most consistent with acute CVA of the corpus callosum, especially given concomitant MRA findings.  -To make sure tumor is fully ruled out, repeat MRI in 8 weeks is recommended.   Acute CVA  -ECHO/Dopplers WNL.  -LDL 55.  -ASA for secondary stroke prevention.  -Will arrange for home health therapies as he is improving too quickly to  qualify for CIR.  HTN  -Continue coreg, clonidine patch.  -Allow permissive HTN for about 2 weeks following acute event. -Would benefit from tight BP control after that time frame has elapsed. -BP on DC is 163/68.  DM  -Strive for control in the OP setting. -CBGs were a bit elevated in the hospital. If continue to be elevated in the OP setting, will likely need some basal insulin (will defer to PCP).   Procedures:  As above   Consultations:  Neurology, Dr. Leta Baptist  Discharge Instructions  Discharge Orders   Future Orders Complete By Expires     Diet - low sodium heart healthy  As directed     Discontinue IV  As directed     Increase activity slowly  As directed         Medication List    STOP taking these medications       BC HEADACHE POWDER PO      TAKE these medications       albuterol 108 (90 BASE) MCG/ACT inhaler  Commonly known as:  PROVENTIL HFA;VENTOLIN HFA  Inhale 2 puffs into the lungs every 6 (six) hours as needed for wheezing.     aspirin 81 MG EC tablet  Take 1 tablet (81 mg total) by mouth daily.  Start taking on:  02/25/2013     carvedilol 25 MG tablet  Commonly known as:  COREG  Take 25 mg by mouth 2 (two) times daily  with a meal.     citalopram 40 MG tablet  Commonly known as:  CELEXA  Take 20 mg by mouth daily.     cloNIDine 0.3 mg/24hr  Commonly known as:  CATAPRES - Dosed in mg/24 hr  Place 1 patch onto the skin once a week.     clopidogrel 75 MG tablet  Commonly known as:  PLAVIX  Take 1 tablet (75 mg total) by mouth daily with breakfast.     eucerin cream  Apply 1 application topically as needed (for dry feet).     fluocinonide cream 0.05 %  Commonly known as:  LIDEX  Apply 1 application topically 2 (two) times daily.     fluticasone 50 MCG/ACT nasal spray  Commonly known as:  FLONASE  Place 2 sprays into the nose daily.     latanoprost 0.005 % ophthalmic solution  Commonly known as:  XALATAN  Place 1 drop into the  left eye at bedtime.     losartan 100 MG tablet  Commonly known as:  COZAAR  Take 100 mg by mouth daily.     MULTIVITAMIN PO  Take 1 tablet by mouth daily.     polyvinyl alcohol 1.4 % ophthalmic solution  Commonly known as:  LIQUIFILM TEARS  Place 1 drop into both eyes 5 (five) times daily as needed (for dry eyes).     simvastatin 40 MG tablet  Commonly known as:  ZOCOR  Take 40 mg by mouth every evening.     spironolactone 25 MG tablet  Commonly known as:  ALDACTONE  Take 25 mg by mouth daily.       No Known Allergies     Follow-up Information   Schedule an appointment as soon as possible for a visit in 2 weeks to follow up. (With your regular provider)        The results of significant diagnostics from this hospitalization (including imaging, microbiology, ancillary and laboratory) are listed below for reference.    Significant Diagnostic Studies: Dg Chest 2 View  02/21/2013   *RADIOLOGY REPORT*  Clinical Data: Stroke and chest pain.  CHEST - 2 VIEW  Comparison: None.  Findings: The heart is mildly enlarged.  The aorta is ectatic.  No pulmonary edema, infiltrate or pleural fluid is identified.  Bony thorax is unremarkable.  IMPRESSION: No active disease.  Mild cardiac enlargement.   Original Report Authenticated By: Aletta Edouard, M.D.   Mr [facility] Wo Contrast  02/20/2013   *RADIOLOGY REPORT*  Clinical Data:  67 year old male with right side facial droop and slurred speech.  Difficulty with balance.  Right side weakness.  Contrast: 58mL MULTIHANCE GADOBENATE DIMEGLUMINE 529 MG/ML IV SOLN  Comparison: None.  MRI HEAD WITHOUT AND WITH CONTRAST  Technique: Multiplanar, multiecho pulse sequences of the brain and surrounding structures were obtained according to standard protocol without and with intravenous contrast.  Findings:  There are sharply demarcated areas of restricted diffusion in the body of the corpus callosum and cingulate gyri, more so the right.  These are  associated with T2 and FLAIR hyperintensity plus expansion of the corpus callosum.  Postcontrast images show mild if any enhancement along the periphery of the abnormal signal.  The area of abnormality encompasses 44 x 26 x 13 mm (AP by transverse by CC).  No definite hemispheric white matter involvement.  No associated hemorrhage.  No other diffusion abnormality. Major intracranial vascular flow voids are preserved, with a degree of intracranial artery dolichoectasia.  See MRA findings  below.  Superimposed mild for age scattered periventricular and subcortical cerebral white matter T2 and FLAIR hyperintensity elsewhere.  No cortical encephalomalacia.  Deep gray matter nuclei, brainstem, cerebellum, pituitary, cervicomedullary junction and visualized cervical spine are within normal limits.  Visualized orbit soft tissues are within normal limits.  Visualized paranasal sinuses and mastoids are clear.  Grossly normal visualized internal auditory structures. Visualized bone marrow signal is within normal limits.  Negative scalp soft tissues.  IMPRESSION: 1.  Abnormal mass-like signal with restricted diffusion in the body of the corpus callosum, and also involving the cingulate gyri more so the right.  Minimal if any associated enhancement.  No associated hemorrhage or significant mass effect on adjacent structures. 2.  Top differential considerations for #1 include acute ACA / median artery corpus callosum infarcts (favored, see neck MRA findings below) versus a primary brain tumor simulating a stroke (moderate to high-grade glioma). 3.  In light of #2, recommend imaging surveillance (e.g. 6 - 8 weeks) to confirm expected post ischemic evolution. 4.  Otherwise mild for age nonspecific white matter signal changes.  Study discussed by telephone with Dr. Daleen Bo on 02/20/2013 at 2330 hours.  MRA HEAD WITHOUT CONTRAST  Technique: Angiographic images of the Circle of Willis were obtained using MRA technique without   intravenous contrast.  Findings:  Degraded by a combination of MOTSA and motion artifact.  Antegrade flow in the posterior circulation with relatively codominant distal vertebral arteries.  Mild to moderate distal vertebral artery irregularity, greater on the left.  Moderate stenosis of the left V4 segment proximal to the left PICA origin. Right PICA origin is patent.  Vertebrobasilar junction is patent, but the basilar artery is irregular with a moderate to severe mid basilar stenosis.  The basilar tip is patent.  SCA origins are patent.  Fetal type bilateral PCA origins, more so the left, with evidence of superimposed P1 segment atherosclerosis.  Bilateral PCA branches are within normal limits.  Antegrade flow in both ICA siphons with ICA dolichoectasia, mild irregularity, and no stenosis.  Ophthalmic and posterior communicating artery origins are within normal limits.  Carotid termini are patent, but the left A1 segment is dominant. The anterior communicating artery is present.  There is a median artery of the corpus callosum which it is difficult to visualize much beyond its origin.  The other ACA branches have more normal appearance.  Both MCA origins are within normal limits.  Bilateral MCA branches are within normal limits.  IMPRESSION: 1.  Intracranial MRA degraded by a combination of MOTSA and motion artifact. 2.  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 3.  However, see the neck MRA findings below, which also included these portions of the intracranial circulation. 4.  Dolichoectasia of the ICA siphons.  Dominant left ACA A1 segment.  MRA NECK WITHOUT AND WITH CONTRAST  Technique:  Angiographic images of the neck were obtained using MRA technique without and with intravenous contrast.  Carotid stenosis measurements (when applicable) are obtained utilizing NASCET criteria, using the distal internal carotid diameter as the  denominator.  Findings:  Precontrast time-of-flight images of the neck demonstrate antegrade flow in both cervical carotid and vertebral arteries. Normal time-of-flight appearance of the carotid bifurcations.  Antegrade flow continues to the skull base.  Postcontrast images suggest a bovine arch configuration.  Great vessel origins are patent without significant stenosis.  The right common carotid artery is mildly tortuous.  There is  mild to moderate irregularity at the posterior right ICA origin compatible with atherosclerotic plaque.  However, no hemodynamically significant cervical ICA stenosis occurs.  The visible right ICA siphon is mildly [place]:  The visible right ICA siphon is mildly dolichoectatic is seen on the intracranial exam. The right ACA A1 segment is diminutive.  The left common carotid artery is normal.  The left carotid bifurcation is normal.  The cervical left ICA is normal aside from mild tortuosity.  The left ICA siphon and dolichoectatic as seen on the intracranial study.  The left ICA terminus is patent along with the MCA and ACA origins.  The proximal ACA branches appear normal. There is a median artery of the corpus callosum which demonstrates irregularity and a 6 mm flow gap at the level of the body of the corpus callosum.  There does appear to be preserved distal flow.  No hemodynamically significant proximal subclavian artery stenosis is identified.  Both vertebral artery origins are within normal limits.  Proximal left vertebral arteries tortuous.  The cervical vertebral arteries are relatively codominant and within normal limits throughout the neck.  Intracranial vertebral arteries are mildly irregular, more so the left.  There does not appear to be hemodynamically significant stenosis at the distal left vertebral artery or vertebrobasilar junction.  There is irregularity of the mid basilar artery, the these images suggest at most moderate stenosis.  IMPRESSION: 1.  Irregularity of the  median artery of the corpus callosum is confirmed, along with a flow gap of 6 mm compatible with a high- grade stenosis.  This supports the ischemic etiology very of the MRI abnormality. 2. The MRA images do not suggest a hemodynamically significant basilar artery stenosis.  Also, the distal vertebral arteries appear more normal than they did on the intracranial portion. 3.  Mild atherosclerosis at the right ICA origin without hemodynamically significant stenosis. 4.  Otherwise negative neck MRA.   Original Report Authenticated By: Roselyn Reef, M.D.   Mr Angiogram Neck W Wo Contrast  02/20/2013   *RADIOLOGY REPORT*  Clinical Data:  67 year old male with right side facial droop and slurred speech.  Difficulty with balance.  Right side weakness.  Contrast: 75mL MULTIHANCE GADOBENATE DIMEGLUMINE 529 MG/ML IV SOLN  Comparison: None.  MRI HEAD WITHOUT AND WITH CONTRAST  Technique: Multiplanar, multiecho pulse sequences of the brain and surrounding structures were obtained according to standard protocol without and with intravenous contrast.  Findings:  There are sharply demarcated areas of restricted diffusion in the body of the corpus callosum and cingulate gyri, more so the right.  These are associated with T2 and FLAIR hyperintensity plus expansion of the corpus callosum.  Postcontrast images show mild if any enhancement along the periphery of the abnormal signal.  The area of abnormality encompasses 44 x 26 x 13 mm (AP by transverse by CC).  No definite hemispheric white matter involvement.  No associated hemorrhage.  No other diffusion abnormality. Major intracranial vascular flow voids are preserved, with a degree of intracranial artery dolichoectasia.  See MRA findings below.  Superimposed mild for age scattered periventricular and subcortical cerebral white matter T2 and FLAIR hyperintensity elsewhere.  No cortical encephalomalacia.  Deep gray matter nuclei, brainstem, cerebellum, pituitary, cervicomedullary  junction and visualized cervical spine are within normal limits.  Visualized orbit soft tissues are within normal limits.  Visualized paranasal sinuses and mastoids are clear.  Grossly normal visualized internal auditory structures. Visualized bone marrow signal is within normal limits.  Negative scalp soft tissues.  IMPRESSION: 1.  Abnormal mass-like signal with restricted diffusion in the body of the corpus callosum, and also involving the cingulate gyri more so the right.  Minimal if any associated enhancement.  No associated hemorrhage or significant mass effect on adjacent structures. 2.  Top differential considerations for #1 include acute ACA / median artery corpus callosum infarcts (favored, see neck MRA findings below) versus a primary brain tumor simulating a stroke (moderate to high-grade glioma). 3.  In light of #2, recommend imaging surveillance (e.g. 6 - 8 weeks) to confirm expected post ischemic evolution. 4.  Otherwise mild for age nonspecific white matter signal changes.  Study discussed by telephone with Dr. Daleen Bo on 02/20/2013 at 2330 hours.  MRA HEAD WITHOUT CONTRAST  Technique: Angiographic images of the Circle of Willis were obtained using MRA technique without  intravenous contrast.  Findings:  Degraded by a combination of MOTSA and motion artifact.  Antegrade flow in the posterior circulation with relatively codominant distal vertebral arteries.  Mild to moderate distal vertebral artery irregularity, greater on the left.  Moderate stenosis of the left V4 segment proximal to the left PICA origin. Right PICA origin is patent.  Vertebrobasilar junction is patent, but the basilar artery is irregular with a moderate to severe mid basilar stenosis.  The basilar tip is patent.  SCA origins are patent.  Fetal type bilateral PCA origins, more so the left, with evidence of superimposed P1 segment atherosclerosis.  Bilateral PCA branches are within normal limits.  Antegrade flow in both ICA  siphons with ICA dolichoectasia, mild irregularity, and no stenosis.  Ophthalmic and posterior communicating artery origins are within normal limits.  Carotid termini are patent, but the left A1 segment is dominant. The anterior communicating artery is present.  There is a median artery of the corpus callosum which it is difficult to visualize much beyond its origin.  The other ACA branches have more normal appearance.  Both MCA origins are within normal limits.  Bilateral MCA branches are within normal limits.  IMPRESSION: 1.  Intracranial MRA degraded by a combination of MOTSA and motion artifact. 2.  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 3.  However, see the neck MRA findings below, which also included these portions of the intracranial circulation. 4.  Dolichoectasia of the ICA siphons.  Dominant left ACA A1 segment.  MRA NECK WITHOUT AND WITH CONTRAST  Technique:  Angiographic images of the neck were obtained using MRA technique without and with intravenous contrast.  Carotid stenosis measurements (when applicable) are obtained utilizing NASCET criteria, using the distal internal carotid diameter as the denominator.  Findings:  Precontrast time-of-flight images of the neck demonstrate antegrade flow in both cervical carotid and vertebral arteries. Normal time-of-flight appearance of the carotid bifurcations.  Antegrade flow continues to the skull base.  Postcontrast images suggest a bovine arch configuration.  Great vessel origins are patent without significant stenosis.  The right common carotid artery is mildly tortuous.  There is mild to moderate irregularity at the posterior right ICA origin compatible with atherosclerotic plaque.  However, no hemodynamically significant cervical ICA stenosis occurs.  The visible right ICA siphon is mildly [place]:  The visible right ICA siphon is mildly dolichoectatic is seen on the  intracranial exam. The right ACA A1 segment is diminutive.  The left common carotid artery is normal.  The left carotid bifurcation is normal.  The cervical left ICA is normal aside from  mild tortuosity.  The left ICA siphon and dolichoectatic as seen on the intracranial study.  The left ICA terminus is patent along with the MCA and ACA origins.  The proximal ACA branches appear normal. There is a median artery of the corpus callosum which demonstrates irregularity and a 6 mm flow gap at the level of the body of the corpus callosum.  There does appear to be preserved distal flow.  No hemodynamically significant proximal subclavian artery stenosis is identified.  Both vertebral artery origins are within normal limits.  Proximal left vertebral arteries tortuous.  The cervical vertebral arteries are relatively codominant and within normal limits throughout the neck.  Intracranial vertebral arteries are mildly irregular, more so the left.  There does not appear to be hemodynamically significant stenosis at the distal left vertebral artery or vertebrobasilar junction.  There is irregularity of the mid basilar artery, the these images suggest at most moderate stenosis.  IMPRESSION: 1.  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  This supports the ischemic etiology very of the MRI abnormality. 2. The MRA images do not suggest a hemodynamically significant basilar artery stenosis.  Also, the distal vertebral arteries appear more normal than they did on the intracranial portion. 3.  Mild atherosclerosis at the right ICA origin without hemodynamically significant stenosis. 4.  Otherwise negative neck MRA.   Original Report Authenticated By: Roselyn Reef, M.D.   Mr [facility] Wo Contrast  02/20/2013   *RADIOLOGY REPORT*  Clinical Data:  67 year old male with right side facial droop and slurred speech.  Difficulty with balance.   Right side weakness.  Contrast: 79mL MULTIHANCE GADOBENATE DIMEGLUMINE 529 MG/ML IV SOLN  Comparison: None.  MRI HEAD WITHOUT AND WITH CONTRAST  Technique: Multiplanar, multiecho pulse sequences of the brain and surrounding structures were obtained according to standard protocol without and with intravenous contrast.  Findings:  There are sharply demarcated areas of restricted diffusion in the body of the corpus callosum and cingulate gyri, more so the right.  These are associated with T2 and FLAIR hyperintensity plus expansion of the corpus callosum.  Postcontrast images show mild if any enhancement along the periphery of the abnormal signal.  The area of abnormality encompasses 44 x 26 x 13 mm (AP by transverse by CC).  No definite hemispheric white matter involvement.  No associated hemorrhage.  No other diffusion abnormality. Major intracranial vascular flow voids are preserved, with a degree of intracranial artery dolichoectasia.  See MRA findings below.  Superimposed mild for age scattered periventricular and subcortical cerebral white matter T2 and FLAIR hyperintensity elsewhere.  No cortical encephalomalacia.  Deep gray matter nuclei, brainstem, cerebellum, pituitary, cervicomedullary junction and visualized cervical spine are within normal limits.  Visualized orbit soft tissues are within normal limits.  Visualized paranasal sinuses and mastoids are clear.  Grossly normal visualized internal auditory structures. Visualized bone marrow signal is within normal limits.  Negative scalp soft tissues.  IMPRESSION: 1.  Abnormal mass-like signal with restricted diffusion in the body of the corpus callosum, and also involving the cingulate gyri more so the right.  Minimal if any associated enhancement.  No associated hemorrhage or significant mass effect on adjacent structures. 2.  Top differential considerations for #1 include acute ACA / median artery corpus callosum infarcts (favored, see neck MRA findings below)  versus a primary brain tumor simulating a stroke (moderate to high-grade glioma). 3.  In light of #2, recommend imaging surveillance (e.g. 6 - 8 weeks) to confirm expected post ischemic evolution. 4.  Otherwise mild for age nonspecific white matter signal changes.  Study discussed by telephone with Dr. Daleen Bo on 02/20/2013 at 2330 hours.  MRA HEAD WITHOUT CONTRAST  Technique: Angiographic images of the Circle of Willis were obtained using MRA technique without  intravenous contrast.  Findings:  Degraded by a combination of MOTSA and motion artifact.  Antegrade flow in the posterior circulation with relatively codominant distal vertebral arteries.  Mild to moderate distal vertebral artery irregularity, greater on the left.  Moderate stenosis of the left V4 segment proximal to the left PICA origin. Right PICA origin is patent.  Vertebrobasilar junction is patent, but the basilar artery is irregular with a moderate to severe mid basilar stenosis.  The basilar tip is patent.  SCA origins are patent.  Fetal type bilateral PCA origins, more so the left, with evidence of superimposed P1 segment atherosclerosis.  Bilateral PCA branches are within normal limits.  Antegrade flow in both ICA siphons with ICA dolichoectasia, mild irregularity, and no stenosis.  Ophthalmic and posterior communicating artery origins are within normal limits.  Carotid termini are patent, but the left A1 segment is dominant. The anterior communicating artery is present.  There is a median artery of the corpus callosum which it is difficult to visualize much beyond its origin.  The other ACA branches have more normal appearance.  Both MCA origins are within normal limits.  Bilateral MCA branches are within normal limits.  IMPRESSION: 1.  Intracranial MRA degraded by a combination of MOTSA and motion artifact. 2.  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 3.   However, see the neck MRA findings below, which also included these portions of the intracranial circulation. 4.  Dolichoectasia of the ICA siphons.  Dominant left ACA A1 segment.  MRA NECK WITHOUT AND WITH CONTRAST  Technique:  Angiographic images of the neck were obtained using MRA technique without and with intravenous contrast.  Carotid stenosis measurements (when applicable) are obtained utilizing NASCET criteria, using the distal internal carotid diameter as the denominator.  Findings:  Precontrast time-of-flight images of the neck demonstrate antegrade flow in both cervical carotid and vertebral arteries. Normal time-of-flight appearance of the carotid bifurcations.  Antegrade flow continues to the skull base.  Postcontrast images suggest a bovine arch configuration.  Great vessel origins are patent without significant stenosis.  The right common carotid artery is mildly tortuous.  There is mild to moderate irregularity at the posterior right ICA origin compatible with atherosclerotic plaque.  However, no hemodynamically significant cervical ICA stenosis occurs.  The visible right ICA siphon is mildly [place]:  The visible right ICA siphon is mildly dolichoectatic is seen on the intracranial exam. The right ACA A1 segment is diminutive.  The left common carotid artery is normal.  The left carotid bifurcation is normal.  The cervical left ICA is normal aside from mild tortuosity.  The left ICA siphon and dolichoectatic as seen on the intracranial study.  The left ICA terminus is patent along with the MCA and ACA origins.  The proximal ACA branches appear normal. There is a median artery of the corpus callosum which demonstrates irregularity and a 6 mm flow gap at the level of the body of the corpus callosum.  There does appear to be preserved distal flow.  No hemodynamically significant proximal subclavian artery  stenosis is identified.  Both vertebral artery origins are within normal limits.  Proximal left  vertebral arteries tortuous.  The cervical vertebral arteries are relatively codominant and within normal limits throughout the neck.  Intracranial vertebral arteries are mildly irregular, more so the left.  There does not appear to be hemodynamically significant stenosis at the distal left vertebral artery or vertebrobasilar junction.  There is irregularity of the mid basilar artery, the these images suggest at most moderate stenosis.  IMPRESSION: 1.  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  This supports the ischemic etiology very of the MRI abnormality. 2. The MRA images do not suggest a hemodynamically significant basilar artery stenosis.  Also, the distal vertebral arteries appear more normal than they did on the intracranial portion. 3.  Mild atherosclerosis at the right ICA origin without hemodynamically significant stenosis. 4.  Otherwise negative neck MRA.   Original Report Authenticated By: Roselyn Reef, M.D.    Microbiology: Recent Results (from the past 240 hour(s))  URINE CULTURE     Status: None   Collection Time    02/20/13  7:20 PM      Result Value Range Status   Specimen Description URINE, CLEAN CATCH   Final   Special Requests NONE   Final   Culture  Setup Time 02/20/2013 20:48   Final   Colony Count NO GROWTH   Final   Culture NO GROWTH   Final   Report Status 02/21/2013 FINAL   Final     Labs: Basic Metabolic Panel:  Recent Labs Lab 02/20/13 1702  NA 135  K 3.6  CL 96  CO2 30  GLUCOSE 201*  BUN 24*  CREATININE 1.52*  CALCIUM 9.3   Liver Function Tests: No results found for this basename: AST, ALT, ALKPHOS, BILITOT, PROT, ALBUMIN,  in the last 168 hours No results found for this basename: LIPASE, AMYLASE,  in the last 168 hours No results found for this basename: AMMONIA,  in the last 168 hours CBC:  Recent Labs Lab 02/20/13 1702  WBC 5.8  NEUTROABS 4.1   HGB 12.9*  HCT 35.8*  MCV 84.2  PLT 161   Cardiac Enzymes: No results found for this basename: CKTOTAL, CKMB, CKMBINDEX, TROPONINI,  in the last 168 hours BNP: BNP (last 3 results) No results found for this basename: PROBNP,  in the last 8760 hours CBG:  Recent Labs Lab 02/23/13 1136 02/23/13 1630 02/23/13 2032 02/24/13 0531 02/24/13 1137  GLUCAP 222* 237* 239* 224* 248*       Signed:  HERNANDEZ ACOSTA,ESTELA  [facility] Pager: [phone] 02/24/2013, 2:23 PM

## 2013-04-22 ENCOUNTER — Encounter (HOSPITAL_COMMUNITY): Payer: Self-pay | Admitting: Emergency Medicine

## 2013-04-22 ENCOUNTER — Emergency Department (HOSPITAL_COMMUNITY)
Admission: EM | Admit: 2013-04-22 | Discharge: 2013-04-22 | Disposition: A | Discharge status: Home / Self Care | Payer: Medicare Other | Attending: Emergency Medicine | Admitting: Emergency Medicine

## 2013-04-22 ENCOUNTER — Emergency Department (HOSPITAL_COMMUNITY): Payer: Medicare Other

## 2013-04-22 DIAGNOSIS — Z8673 Personal history of transient ischemic attack (TIA), and cerebral infarction without residual deficits: Secondary | ICD-10-CM | POA: Insufficient documentation

## 2013-04-22 DIAGNOSIS — R5381 Other malaise: Secondary | ICD-10-CM | POA: Insufficient documentation

## 2013-04-22 DIAGNOSIS — R1110 Vomiting, unspecified: Secondary | ICD-10-CM

## 2013-04-22 DIAGNOSIS — R630 Anorexia: Secondary | ICD-10-CM | POA: Insufficient documentation

## 2013-04-22 DIAGNOSIS — E1139 Type 2 diabetes mellitus with other diabetic ophthalmic complication: Secondary | ICD-10-CM | POA: Insufficient documentation

## 2013-04-22 DIAGNOSIS — E119 Type 2 diabetes mellitus without complications: Secondary | ICD-10-CM | POA: Insufficient documentation

## 2013-04-22 DIAGNOSIS — R51 Headache: Secondary | ICD-10-CM | POA: Insufficient documentation

## 2013-04-22 DIAGNOSIS — R112 Nausea with vomiting, unspecified: Secondary | ICD-10-CM | POA: Insufficient documentation

## 2013-04-22 DIAGNOSIS — E860 Dehydration: Secondary | ICD-10-CM

## 2013-04-22 DIAGNOSIS — I10 Essential (primary) hypertension: Secondary | ICD-10-CM

## 2013-04-22 DIAGNOSIS — R61 Generalized hyperhidrosis: Secondary | ICD-10-CM | POA: Insufficient documentation

## 2013-04-22 DIAGNOSIS — R531 Weakness: Secondary | ICD-10-CM

## 2013-04-22 DIAGNOSIS — E1142 Type 2 diabetes mellitus with diabetic polyneuropathy: Secondary | ICD-10-CM | POA: Insufficient documentation

## 2013-04-22 DIAGNOSIS — IMO0002 Reserved for concepts with insufficient information to code with codable children: Secondary | ICD-10-CM | POA: Insufficient documentation

## 2013-04-22 DIAGNOSIS — Z7982 Long term (current) use of aspirin: Secondary | ICD-10-CM | POA: Insufficient documentation

## 2013-04-22 DIAGNOSIS — R42 Dizziness and giddiness: Secondary | ICD-10-CM | POA: Insufficient documentation

## 2013-04-22 DIAGNOSIS — Z79899 Other long term (current) drug therapy: Secondary | ICD-10-CM | POA: Insufficient documentation

## 2013-04-22 DIAGNOSIS — E1149 Type 2 diabetes mellitus with other diabetic neurological complication: Secondary | ICD-10-CM | POA: Insufficient documentation

## 2013-04-22 DIAGNOSIS — E11319 Type 2 diabetes mellitus with unspecified diabetic retinopathy without macular edema: Secondary | ICD-10-CM | POA: Insufficient documentation

## 2013-04-22 HISTORY — DX: Cerebral infarction, unspecified: I63.9

## 2013-04-22 LAB — URINE MICROSCOPIC-ADD ON

## 2013-04-22 LAB — BASIC METABOLIC PANEL
CO2: 30 mEq/L (ref 19–32)
Calcium: 9.8 mg/dL (ref 8.4–10.5)
Creatinine, Ser: 1.26 mg/dL (ref 0.50–1.35)
GFR calc non Af Amer: 58 mL/min — ABNORMAL LOW (ref >90)
Sodium: 135 mEq/L (ref 135–145)

## 2013-04-22 LAB — CBC WITH DIFFERENTIAL/PLATELET
Basophils Absolute: 0 10*3/uL (ref 0.0–0.1)
Basophils Relative: 0 % (ref 0–1)
Eosinophils Absolute: 0.1 10*3/uL (ref 0.0–0.7)
Eosinophils Relative: 1 % (ref 0–5)
HCT: 36 % — ABNORMAL LOW (ref 39.0–52.0)
MCH: 30.6 pg (ref 26.0–34.0)
MCHC: 36.4 g/dL — ABNORMAL HIGH (ref 30.0–36.0)
MCV: 84.1 fL (ref 78.0–100.0)
Monocytes Absolute: 0.5 10*3/uL (ref 0.1–1.0)
Platelets: 158 10*3/uL (ref 150–400)
RDW: 13.8 % (ref 11.5–15.5)
WBC: 5.8 10*3/uL (ref 4.0–10.5)

## 2013-04-22 LAB — URINALYSIS, ROUTINE W REFLEX MICROSCOPIC
Glucose, UA: 500 mg/dL — AB
Ketones, ur: NEGATIVE mg/dL
Leukocytes, UA: NEGATIVE
Protein, ur: >300 mg/dL — AB
Urobilinogen, UA: 1 mg/dL (ref 0.0–1.0)

## 2013-04-22 LAB — TROPONIN I: Troponin I: <0.3 ng/mL (ref <0.30)

## 2013-04-22 MED ORDER — SODIUM CHLORIDE 0.9 % IV BOLUS (SEPSIS)
1000.0000 mL | Freq: Once | INTRAVENOUS | Status: AC
  Administered 2013-04-22: 1000 mL via INTRAVENOUS

## 2013-04-22 MED ORDER — ONDANSETRON HCL 4 MG/2ML IJ SOLN
4.0000 mg | Freq: Once | INTRAMUSCULAR | Status: AC
  Administered 2013-04-22: 4 mg via INTRAVENOUS
  Filled 2013-04-22: qty 2

## 2013-04-22 MED ORDER — ONDANSETRON 4 MG PO TBDP: ORAL_TABLET | ORAL | Status: DC

## 2013-04-22 NOTE — ED Provider Notes (Addendum)
CSN: [id number]     Arrival date & time 04/22/13  1422 History     First MD Initiated Contact with Patient 04/22/13 1449     Chief Complaint  Patient presents with  . Fatigue   (Consider location/radiation/quality/duration/timing/severity/associated sxs/prior Treatment) HPI Comments: 67 yo male with DM, HTN, CVA in June 14 presents with acute onset nausea and g weakness since 2 hrs PTA.  Pt felt so weak he collapsed to the ground, no head injury or loc.  Mild frontal ha, gradual onset on arrival to ED.  No cp or significant sob.  Pt has mild subj left sided weakness since stroke.  [place] new medicine started yesterday after dental filling, pt has had in the past.  Pt on asa.  Sxs general and mild.  Pt has had in the past.  Worse with standing.   The history is provided by the patient and the spouse.    Past Medical History  Diagnosis Date  . Hypertension   . Diabetes mellitus without complication   . Diabetic retinopathy   . Diabetic neuropathy   . Stroke     left sided deficits   Past Surgical History  Procedure Laterality Date  . Eye surgery    . Arm surgery      Tumor removed    Family History  Problem Relation Age of Onset  . Diabetes Mother   . Breast cancer Mother   . Stroke Brother    History  Substance Use Topics  . Smoking status: Never Smoker   . Smokeless tobacco: Not on file  . Alcohol Use: No    Review of Systems  Constitutional: Positive for diaphoresis, appetite change and fatigue. Negative for fever and chills.  HENT: Negative for neck pain and neck stiffness.   Eyes: Negative for visual disturbance.  Respiratory: Negative for shortness of breath.   Cardiovascular: Negative for chest pain.  Gastrointestinal: Positive for nausea and vomiting. Negative for abdominal pain.  Genitourinary: Negative for dysuria and flank pain.  Musculoskeletal: Negative for back pain.  Skin: Negative for rash.  Neurological: Positive for weakness (gen), light-headedness  and headaches.    Allergies  Review of patient's allergies indicates no known allergies.  Home Medications   Current Outpatient Rx  Name  Route  Sig  Dispense  Refill  . albuterol (PROVENTIL HFA;VENTOLIN HFA) 108 (90 BASE) MCG/ACT inhaler   Inhalation   Inhale 2 puffs into the lungs every 6 (six) hours as needed for wheezing.         [name] aspirin EC 81 MG EC tablet   Oral   Take 1 tablet (81 mg total) by mouth daily.   30 tablet   2   . carvedilol (COREG) 25 MG tablet   Oral   Take 25 mg by mouth 2 (two) times daily with a meal.         . citalopram (CELEXA) 40 MG tablet   Oral   Take 20 mg by mouth daily.         . cloNIDine (CATAPRES - DOSED IN MG/24 HR) 0.3 mg/24hr   Transdermal   Place 1 patch onto the skin once a week.         . clopidogrel (PLAVIX) 75 MG tablet   Oral   Take 1 tablet (75 mg total) by mouth daily with breakfast.   30 tablet   2   . fluocinonide cream (LIDEX) 0.05 %   Topical   Apply 1 application topically 2 (  two) times daily.         . fluticasone (FLONASE) 50 MCG/ACT nasal spray   Nasal   Place 2 sprays into the nose daily.         [name] latanoprost (XALATAN) 0.005 % ophthalmic solution   Left Eye   Place 1 drop into the left eye at bedtime.         [name] losartan (COZAAR) 100 MG tablet   Oral   Take 100 mg by mouth daily.         . Multiple Vitamins-Minerals (MULTIVITAMIN PO)   Oral   Take 1 tablet by mouth daily.         . polyvinyl alcohol (LIQUIFILM TEARS) 1.4 % ophthalmic solution   Both Eyes   Place 1 drop into both eyes 5 (five) times daily as needed (for dry eyes).         . simvastatin (ZOCOR) 40 MG tablet   Oral   Take 40 mg by mouth every evening.         . Skin Protectants, Misc. (EUCERIN) cream   Topical   Apply 1 application topically as needed (for dry feet).         [name] spironolactone (ALDACTONE) 25 MG tablet   Oral   Take 25 mg by mouth daily.          BP 195/98  Pulse 66  Temp(Src) 96.7  F (35.9 C) (Oral)  Resp 17  Ht 6\' 4"  (1.93 m)  Wt 238 lb (107.956 kg)  BMI 28.98 kg/m2  SpO2 100% Physical Exam  Nursing note and vitals reviewed. Constitutional: He is oriented to person, place, and time. He appears well-developed and well-nourished. No distress.  Dry mm  HENT:  Head: Normocephalic and atraumatic.  Eyes: Conjunctivae are normal. Right eye exhibits no discharge. Left eye exhibits no discharge.  Neck: Normal range of motion. Neck supple. No tracheal deviation present.  Cardiovascular: Normal rate and regular rhythm.   No murmur heard. Pulmonary/Chest: Effort normal and breath sounds normal.  Abdominal: Soft. He exhibits no distension. There is no tenderness. There is no guarding.  Musculoskeletal: He exhibits no edema and no tenderness.  Neurological: He is alert and oriented to person, place, and time. No cranial nerve deficit or sensory deficit. Coordination and gait normal. GCS eye subscore is 4. GCS verbal subscore is 5. GCS motor subscore is 6.  g weakness but able to sit up and stand himself  Skin: Skin is warm. No rash noted.  Psychiatric: He has a normal mood and affect.    ED Course   Procedures (including critical care time)  Labs Reviewed  CBC WITH DIFFERENTIAL - Abnormal; Notable for the following:    HCT 36.0 (*)    MCHC 36.4 (*)    Neutrophils Relative % 80 (*)    Lymphocytes Relative 11 (*)    Lymphs Abs 0.6 (*)    All other components within normal limits  BASIC METABOLIC PANEL - Abnormal; Notable for the following:    Glucose, Bld 277 (*)    GFR calc non Af Amer 58 (*)    GFR calc Af Amer 67 (*)    All other components within normal limits  URINALYSIS, ROUTINE W REFLEX MICROSCOPIC - Abnormal; Notable for the following:    Glucose, UA 500 (*)    Hgb urine dipstick TRACE (*)    Protein, ur >300 (*)    All other components within normal limits  URINE MICROSCOPIC-ADD ON -  Abnormal; Notable for the following:    Casts HYALINE CASTS (*)     All other components within normal limits  TROPONIN I   No results found. No diagnosis found.  MDM  Broad differential with g weakness including cardiac, infectious, CNS, metabolic/ dehydration. Antiemetics, fluids, CT head with recent stroke. Non focal exam.  No signs of acute stroke.  Date: 04/22/2013  Rate: 65  Rhythm: normal sinus rhythm  QRS Axis: right  Intervals: mild qt prolong  ST/T Wave abnormalities: nonspecific ST changes  Conduction Disutrbances: none  Narrative Interpretation:   Old EKG Reviewed: changes noted  Pt improved on recheck, no sxs.  No acute findings on recheck. Discussed observation vs close outpt fup, pt prefers outpt fup.  Fluids going, will dispo. DC Patient bp elevated in ED, pt has not taken bp meds recently, currently taking on dc po.  No sxs. Close fup recommended.  Mariea Clonts, MD 04/22/13 1803  Mariea Clonts, MD 04/22/13 Vernelle Emerald  Mariea Clonts, MD 04/22/13 [phone]

## 2013-04-22 NOTE — ED Notes (Signed)
Per EMS - pt coming from home. Pt had temporary feeling in his teeth yesterday. Woke up around 6am this morning, took his pain meds and antibiotic and prednisone for his tooth, went back to bed then woke up around 11am. Pt reports he just doesn't feel right, c/o generalized weakness. Walked downstairs, became diaphoretic and very tired, remembers his wife asking him questions but not sure what she was saying then vomited. Upon EMS arrival - pt was diaphoretic and vomited X 1. Wife reports she took BP at home 90 systolic, EMS BP [id number] systolic. CBG 252, insulin dependent. BP 190/111 (pt has hx of HTN, hasn't been taking his meds) pt has stroke about a month ago, affected left side and has weakness on that side. HR 66 RR 16.

## 2013-04-22 NOTE — ED Notes (Signed)
IV team at bedside 

## 2013-04-22 NOTE — ED Notes (Signed)
2 RNs attempted to obtain IV access. Pt stuck a total of 2 times. Pt refusing hand stick and requesting IV team be called. IV team paged and returned page.

## 2013-06-12 ENCOUNTER — Encounter: Payer: Self-pay | Admitting: Neurology

## 2013-06-12 ENCOUNTER — Encounter (INDEPENDENT_AMBULATORY_CARE_PROVIDER_SITE_OTHER): Payer: Self-pay

## 2013-06-12 ENCOUNTER — Ambulatory Visit (INDEPENDENT_AMBULATORY_CARE_PROVIDER_SITE_OTHER): Payer: Medicare Other | Admitting: Neurology

## 2013-06-12 VITALS — BP 149/88 | HR 74 | Temp 98.5°F | Ht 76.5 in | Wt 236.0 lb

## 2013-06-12 DIAGNOSIS — I6350 Cerebral infarction due to unspecified occlusion or stenosis of unspecified cerebral artery: Secondary | ICD-10-CM

## 2013-06-12 NOTE — Progress Notes (Signed)
[facility] [address] [place]. [place] [address] [phone]       OFFICE FOLLOW-UP NOTE  Cody Silva Date of Birth:  1946-09-02 Medical Record Number:  [id number]   HPI: 67  year male seen for first office f/u visit after [facility] admission on  02/20/2013.he presented with 3 month h/o intermittent slurred speech, leg weakness and 1 episode of syncope.he was evaluated at the [facility] clinic and had several head CTs which were unremarkable.he had 2 episodes of falling on day of admission and MRI brain showed a large 4 x 4 cm diffusion positive lesion in corpus callosum involving right more than left cingulate guyrus with only faint enhancement. It was unclear wether this was a callosal branch anterior cerebral artery infarct or a mass lesion.MRA showed decrease flow in median artery of corpus callosum.Carotid dopplers and 2DEcho showed normal ejection fraction.EEG was normal.Lipid profile was normal. HbA1c was elevated at 7.5%He is tolerating plavix well without bleeding or side effects.He states his gait and balance have improved and he is walking well.He notices some imbalance when he is in a hurry or tired but is able to catch himself without falling. He has sleep apnoea but is not using his CPAP mask daily.  ROS:   14 system review of systems is positive for fatigue,loss of vision,hearing loss,ringing in ears,trouble swallowing,snoring,memoryloss,headache,weakness,passing out,decreased energy,   PMH:  Past Medical History  Diagnosis Date  . Hypertension   . Diabetes mellitus without complication   . Diabetic retinopathy   . Diabetic neuropathy   . Stroke     left sided deficits    Social History:  History   Social History  . Marital Status: Married    Spouse Name: N/A    Number of Children: N/A  . Years of Education: N/A   Occupational History  . Not on file.   Social History Main Topics  . Smoking status: Never Smoker   . Smokeless tobacco: Not on file   . Alcohol Use: Yes  . Drug Use: No  . Sexual Activity: Yes   Other Topics Concern  . Not on file   Social History Narrative  . No narrative on file    Medications:   Current Outpatient Prescriptions on File Prior to Visit  Medication Sig Dispense Refill  . albuterol (PROVENTIL HFA;VENTOLIN HFA) 108 (90 BASE) MCG/ACT inhaler Inhale 2 puffs into the lungs every 6 (six) hours as needed for wheezing.      . carvedilol (COREG) 25 MG tablet Take 25 mg by mouth 2 (two) times daily with a meal.      . citalopram (CELEXA) 40 MG tablet Take 20 mg by mouth daily.      . cloNIDine (CATAPRES - DOSED IN MG/24 HR) 0.3 mg/24hr Place 1 patch onto the skin once a week.      . clopidogrel (PLAVIX) 75 MG tablet Take 1 tablet (75 mg total) by mouth daily with breakfast.  30 tablet  2  . fluocinonide cream (LIDEX) [id number] % Apply 1 application topically 2 (two) times daily.      . fluticasone (FLONASE) 50 MCG/ACT nasal spray Place 2 sprays into the nose daily.      [name] latanoprost (XALATAN) 0.005 % ophthalmic solution Place 1 drop into the left eye at bedtime.      [name] losartan (COZAAR) 100 MG tablet Take 100 mg by mouth daily.      . Multiple Vitamins-Minerals (MULTIVITAMIN PO) Take 1 tablet by mouth daily.      [name]  penicillin v potassium (VEETID) 500 MG tablet Take 500 mg by mouth 4 (four) times daily.      . polyvinyl alcohol (LIQUIFILM TEARS) 1.4 % ophthalmic solution Place 1 drop into both eyes 5 (five) times daily as needed (for dry eyes).      . simvastatin (ZOCOR) 40 MG tablet Take 40 mg by mouth every evening.      . Skin Protectants, Misc. (EUCERIN) cream Apply 1 application topically as needed (for dry feet).      [name] spironolactone (ALDACTONE) 25 MG tablet Take 25 mg by mouth daily.       No current facility-administered medications on file prior to visit.    Allergies:   Allergies  Allergen Reactions  . Metformin And Related     Physical Exam General: well developed, well nourished, african  american male seated, in no evident distress Head: head normocephalic and atraumatic. Orohparynx benign Neck: supple with no carotid or supraclavicular bruits Cardiovascular: regular rate and rhythm, no murmurs Musculoskeletal: no deformity Skin:  no rash/petichiae Vascular:  Normal pulses all extremities Filed Vitals:   06/12/13 1408  BP: 149/88  Pulse: 74  Temp: 98.5 F (36.9 C)    Neurologic Exam Mental Status: Awake and fully alert. Oriented to place and time. Recent  Memory diminished with recall 2/3 and remote memory intact. Attention span, concentration and fund of knowledge appropriate. Mood and affect appropriate.Animal Naming 11.  Cranial Nerves: Fundoscopic exam reveals sharp disc margins. Pupils equal, briskly reactive to light. Extraocular movements full without nystagmus. Visual fields full to confrontation. Hearing intact. Facial sensation intact. Face, tongue, palate moves normally and symmetrically.  Motor: Normal bulk and tone. Normal strength in all tested extremity muscles.diminished fine finger movements on left and orbits right over left upper extremity. Sensory.: intact to tough and pinprick and vibratory.  Coordination: Rapid alternating movements normal in all extremities. Finger-to-nose and heel-to-shin performed accurately bilaterally. Gait and Station: Arises from chair without difficulty. Stance is normal. Gait demonstrates normal stride length and balance . Able to heel, toe and tandem walk without difficulty.  Reflexes: 1+ and symmetric. Toes downgoing.   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 I also counseled him to use his CPAP machine every night to treat his sleep apnea. Check followup MRI scan of the brain with and without contrast to evaluate his corpus callosum infarct versus tumor the Return for followup in 3 months with Charlott Holler, NP

## 2013-06-12 NOTE — Patient Instructions (Signed)
Discontinue aspirin but  continue Plavix 75 mg daily for stroke prevention with strict control of hypertension with blood pressure goal below 130/90 and lipids with LDL cholesterol goal below 100 mg percent. I also counseled him to use his CPAP machine every night to treat his sleep apnea. Check followup MRI scan of the brain with and without contrast to evaluate his corpus callosum infarct versus tumor the Return for followup in 3 months with Charlott Holler, NP

## 2013-07-17 ENCOUNTER — Ambulatory Visit
Admission: RE | Admit: 2013-07-17 | Discharge: 2013-07-17 | Disposition: A | Discharge status: Home / Self Care | Payer: 59 | Source: Ambulatory Visit | Attending: Neurology | Admitting: Neurology

## 2013-07-17 DIAGNOSIS — I6350 Cerebral infarction due to unspecified occlusion or stenosis of unspecified cerebral artery: Secondary | ICD-10-CM

## 2013-07-17 DIAGNOSIS — I6789 Other cerebrovascular disease: Secondary | ICD-10-CM

## 2013-07-17 MED ORDER — GADOBENATE DIMEGLUMINE 529 MG/ML IV SOLN
19.0000 mL | Freq: Once | INTRAVENOUS | Status: AC | PRN
  Administered 2013-07-17: 19 mL via INTRAVENOUS

## 2013-09-12 ENCOUNTER — Encounter (INDEPENDENT_AMBULATORY_CARE_PROVIDER_SITE_OTHER): Payer: Self-pay

## 2013-09-12 ENCOUNTER — Encounter: Payer: Self-pay | Admitting: Nurse Practitioner

## 2013-09-12 ENCOUNTER — Ambulatory Visit (INDEPENDENT_AMBULATORY_CARE_PROVIDER_SITE_OTHER): Payer: Medicare Other | Admitting: Nurse Practitioner

## 2013-09-12 VITALS — BP 166/92 | HR 70 | Wt 240.0 lb

## 2013-09-12 DIAGNOSIS — I6350 Cerebral infarction due to unspecified occlusion or stenosis of unspecified cerebral artery: Secondary | ICD-10-CM

## 2013-09-12 MED ORDER — CLOPIDOGREL BISULFATE 75 MG PO TABS: 75.0000 mg | ORAL_TABLET | Freq: Every day | ORAL | Status: DC

## 2013-09-12 NOTE — Patient Instructions (Addendum)
PLAN:  Continue Plavix 75 mg daily for stroke prevention with strict control of hypertension with blood pressure goal below 130/90 and lipids with LDL cholesterol goal below 100 mg percent. I also counseled him to use his CPAP machine every night to treat his sleep apnea.  Return for followup in 6 months with Charlott Holler, NP

## 2013-09-12 NOTE — Progress Notes (Signed)
PATIENT: Cody Silva DOB: 04-04-1946   REASON FOR VISIT: follow up for stroke HISTORY FROM: patient  HISTORY OF PRESENT ILLNESS: 68 year male seen for first office f/u visit after [facility] admission on 02/20/2013.he presented with 3 month h/o intermittent slurred speech, leg weakness and 1 episode of syncope.he was evaluated at the [facility] and had several head CTs which were unremarkable.he had 2 episodes of falling on day of admission and MRI brain showed a large 4 x 4 cm diffusion positive lesion in corpus callosum involving right more than left cingulate guyrus with only faint enhancement. It was unclear wether this was a callosal branch anterior cerebral artery infarct or a mass lesion.MRA showed decrease flow in median artery of corpus callosum.Carotid dopplers and 2DEcho showed normal ejection fraction.  EEG was normal.  Lipid profile was normal. HbA1c was elevated at 7.5%  He is tolerating plavix well without bleeding or side effects.He states his gait and balance have improved and he is walking well.  He notices some imbalance when he is in a hurry or tired but is able to catch himself without falling. He has sleep apnea but is not using his CPAP mask daily.   UPDATE 09/12/13 (LL):  [name] returns to office for stroke revisit.  He states he has been doing well, having some headaches but not too often.  He complains that his memory is not as well as before the stroke.  His wife does not notice any problem with his memory.  His follow up MRI showed expected evolutionary changes, no mention of mass lesion.  He is tolerating Plavix well but has stopped currently to have a tooth extracted early next week.  His BP is elevated in office today, at 166/92, but he says it is usually lower.  ROS:  14 system review of systems is positive for eye itching, ringing in ears,  Urinary urgency, Nervous/anxious  ALLERGIES: Allergies  Allergen Reactions  . Metformin And Related     HOME  MEDICATIONS: Outpatient Prescriptions Prior to Visit  Medication Sig Dispense Refill  . albuterol (PROVENTIL HFA;VENTOLIN HFA) 108 (90 BASE) MCG/ACT inhaler Inhale 2 puffs into the lungs every 6 (six) hours as needed for wheezing.      . carvedilol (COREG) 25 MG tablet Take 25 mg by mouth 2 (two) times daily with a meal.      . citalopram (CELEXA) 40 MG tablet Take 20 mg by mouth daily.      . cloNIDine (CATAPRES - DOSED IN MG/24 HR) 0.3 mg/24hr Place 1 patch onto the skin once a week.      . fluocinonide cream (LIDEX) AB-123456789 % Apply 1 application topically 2 (two) times daily.      . fluticasone (FLONASE) 50 MCG/ACT nasal spray Place 2 sprays into the nose daily.      [name] latanoprost (XALATAN) 0.005 % ophthalmic solution Place 1 drop into the left eye at bedtime.      [name] losartan (COZAAR) 100 MG tablet Take 100 mg by mouth daily.      . Multiple Vitamins-Minerals (MULTIVITAMIN PO) Take 1 tablet by mouth daily.      . penicillin v potassium (VEETID) 500 MG tablet Take 500 mg by mouth 4 (four) times daily.      . polyvinyl alcohol (LIQUIFILM TEARS) 1.4 % ophthalmic solution Place 1 drop into both eyes 5 (five) times daily as needed (for dry eyes).      . simvastatin (ZOCOR) 40 MG  tablet Take 40 mg by mouth every evening.      . Skin Protectants, Misc. (EUCERIN) cream Apply 1 application topically as needed (for dry feet).      [name] spironolactone (ALDACTONE) 25 MG tablet Take 25 mg by mouth daily.      . clopidogrel (PLAVIX) 75 MG tablet Take 1 tablet (75 mg total) by mouth daily with breakfast.  30 tablet  2   No facility-administered medications prior to visit.    PAST MEDICAL HISTORY: Past Medical History  Diagnosis Date  . Hypertension   . Diabetes mellitus without complication   . Diabetic retinopathy   . Diabetic neuropathy   . Stroke     left sided deficits    PAST SURGICAL HISTORY: Past Surgical History  Procedure Laterality Date  . Eye surgery    . Arm surgery      Tumor removed      FAMILY HISTORY: Family History  Problem Relation Age of Onset  . Diabetes Mother   . Breast cancer Mother   . Stroke Brother   . Neurofibromatosis Maternal Uncle     SOCIAL HISTORY: History   Social History  . Marital Status: Married    Spouse Name: sheila    Number of Children: 4  . Years of Education: N/A   Occupational History  . Not on file.   Social History Main Topics  . Smoking status: Never Smoker   . Smokeless tobacco: Not on file  . Alcohol Use: Yes  . Drug Use: No  . Sexual Activity: Yes   Other Topics Concern  . Not on file   Social History Narrative  . No narrative on file     PHYSICAL EXAM  Filed Vitals:   09/12/13 1048  BP: 166/92  Pulse: 70  Weight: 240 lb (108.863 kg)   Body mass index is 28.84 kg/(m^2).  General: well developed, well nourished, african american male seated, in no evident distress  Head: head normocephalic and atraumatic. Orohparynx benign  Neck: supple with no carotid or supraclavicular bruits  Cardiovascular: regular rate and rhythm, no murmurs  Musculoskeletal: no deformity  Skin: no rash/petichiae  Vascular: Normal pulses all extremities  Neurologic Exam  Mental Status: Awake and fully alert. Oriented to place and time. MOCA score 29/30 with 4/5 in delayed recall.  Attention span, concentration and fund of knowledge appropriate. Mood and affect appropriate. Cranial Nerves:  Pupils equal, briskly reactive to light. Extraocular movements full without nystagmus. Visual fields full to confrontation. Hearing intact. Facial sensation intact. Face, tongue, palate moves normally and symmetrically.  Motor: Normal bulk and tone. Normal strength in all tested extremity muscles.diminished fine finger movements on left and orbits right over left upper extremity.  Sensory.: intact to tough and pinprick and vibratory.  Coordination: Rapid alternating movements normal in all extremities. Finger-to-nose and heel-to-shin performed  accurately bilaterally.  Gait and Station: Arises from chair without difficulty. Stance is normal. Gait demonstrates normal stride length and balance . Able to heel, toe and tandem walk without difficulty.  Reflexes: 1+ and symmetric. Toes downgoing.   DIAGNOSTIC DATA (LABS, IMAGING, TESTING) - I reviewed patient records, labs, notes, testing and imaging myself where available.  07/17/13 Mri brain without  Abnormal MRI scan the brain showing multiple nonspecific periventricular and subcortical nonspecific white matter hyperintensities with a differential discussed above. No enhancing lesions are noted. Compared with MRI scan dated 02/20/2013 the previously described cortical acute infarct shows expected evolutionary changes.  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  Vascular risk factors of HT, Sleep Apnea and Diabetes. MOCA score is 29/30, memory problems are subjective only.  PLAN:  Continue Plavix 75 mg daily for stroke prevention with strict control of hypertension with blood pressure goal below 130/90 and lipids with LDL cholesterol goal below 100 mg percent. I also counseled him to use his CPAP machine every night to treat his sleep apnea. I advised him to only be off his plavix for as little as necessary for tooth extraction.  Standard is 5 days before procedure.   Return for followup in 6 months with Charlott Holler, NP    Meds ordered this encounter  Medications  . clopidogrel (PLAVIX) 75 MG tablet    Sig: Take 1 tablet (75 mg total) by mouth daily with breakfast.    Dispense:  90 tablet    Refill:  3    Order Specific Question:  Supervising Provider    Answer:  Penni Bombard [[id number]]   Return in about 6 months (around 03/12/2014).  Philmore Pali, MSN, NP-C 09/12/2013, 12:22 PM [facility] [address] [place], [place] [address] [phone]  Note: This document was prepared with digital dictation and possible smart phrase technology. Any transcriptional errors that result from this process are unintentional.

## 2014-03-12 ENCOUNTER — Encounter: Payer: Self-pay | Admitting: Nurse Practitioner

## 2014-03-12 ENCOUNTER — Ambulatory Visit (INDEPENDENT_AMBULATORY_CARE_PROVIDER_SITE_OTHER): Payer: Medicare Other | Admitting: Nurse Practitioner

## 2014-03-12 ENCOUNTER — Encounter (INDEPENDENT_AMBULATORY_CARE_PROVIDER_SITE_OTHER): Payer: Self-pay

## 2014-03-12 VITALS — BP 188/90 | HR 70 | Ht 76.5 in | Wt 247.4 lb

## 2014-03-12 DIAGNOSIS — I6350 Cerebral infarction due to unspecified occlusion or stenosis of unspecified cerebral artery: Secondary | ICD-10-CM

## 2014-03-12 DIAGNOSIS — R2689 Other abnormalities of gait and mobility: Secondary | ICD-10-CM

## 2014-03-12 DIAGNOSIS — R29818 Other symptoms and signs involving the nervous system: Secondary | ICD-10-CM

## 2014-03-12 NOTE — Progress Notes (Signed)
PATIENT: Cody Silva DOB: [birth year]  REASON FOR VISIT: routine follow up for stroke HISTORY FROM: patient  HISTORY OF PRESENT ILLNESS: 68 year male seen for first office f/u visit after [facility] admission on 02/20/2013.he presented with 3 month h/o intermittent slurred speech, leg weakness and 1 episode of syncope.he was evaluated at the [facility] clinic and had several head CTs which were unremarkable.he had 2 episodes of falling on day of admission and MRI brain showed a large 4 x 4 cm diffusion positive lesion in corpus callosum involving right more than left cingulate guyrus with only faint enhancement. It was unclear wether this was a callosal branch anterior cerebral artery infarct or a mass lesion.MRA showed decrease flow in median artery of corpus callosum.Carotid dopplers and 2DEcho showed normal ejection fraction. EEG was normal. Lipid profile was normal. HbA1c was elevated at 7.5% He is tolerating plavix well without bleeding or side effects.He states his gait and balance have improved and he is walking well. He notices some imbalance when he is in a hurry or tired but is able to catch himself without falling. He has sleep apnea but is not using his CPAP mask daily.  UPDATE 09/12/13 (LL): Cody Silva returns to office for stroke revisit. He states he has been doing well, having some headaches but not too often. He complains that his memory is not as well as before the stroke. His wife does not notice any problem with his memory. His follow up MRI showed expected evolutionary changes, no mention of mass lesion. He is tolerating Plavix well but has stopped currently to have a tooth extracted early next week. His BP is elevated in office today, at 166/92, but he says it is usually lower.   UPDATE 03/12/14 (LL): Since last visit, he has not had any new neurovascular symptoms. He has started to have more difficulties with imbalance. 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 His  blood pressure is elevated in the office today, 188/90.  He states it is not that high when he takes it at home. He is having progressively more acting out dreams, but his wife states that he does not get out of bed.  He has PTSD from being in the [place] War, and has had treatment through the [place]. He does not wear his cpap consistently. His blood sugars in the morning range from 130-170, but he has had some problems with lows as well.  He is tolerating Plavix well with no signs of significant bleeding or bruising.   REVIEW OF SYSTEMS: Full 14 system review of systems performed and notable only for:   14 system review of systems is positive for light sensitivity, loss of vision, ringing in ears, cough, shortness of breath, leg swelling, apnea, snoring, acting out dreams, Urinary urgency, walking difficulty Nervous/anxious.   ALLERGIES: Allergies  Allergen Reactions  . Metformin And Related     HOME MEDICATIONS: Outpatient Prescriptions Prior to Visit  Medication Sig Dispense Refill  . albuterol (PROVENTIL HFA;VENTOLIN HFA) 108 (90 BASE) MCG/ACT inhaler Inhale 2 puffs into the lungs every 6 (six) hours as needed for wheezing.      . carvedilol (COREG) 25 MG tablet Take 25 mg by mouth 2 (two) times daily with a meal.      . citalopram (CELEXA) 40 MG tablet Take 20 mg by mouth daily.      . cloNIDine (CATAPRES - DOSED IN MG/24 HR) 0.3 mg/24hr Place 1 patch  onto the skin once a week.      . clopidogrel (PLAVIX) 75 MG tablet Take 1 tablet (75 mg total) by mouth daily with breakfast.  90 tablet  3  . fluocinonide cream (LIDEX) [id number] % Apply 1 application topically 2 (two) times daily.      . fluticasone (FLONASE) 50 MCG/ACT nasal spray Place 2 sprays into the nose daily.      [name] latanoprost (XALATAN) 0.005 % ophthalmic solution Place 1 drop into the left eye at bedtime.      [name] losartan (COZAAR) 100 MG tablet Take 100 mg by mouth daily.      . Multiple Vitamins-Minerals (MULTIVITAMIN PO) Take 1 tablet  by mouth daily.      . polyvinyl alcohol (LIQUIFILM TEARS) 1.4 % ophthalmic solution Place 1 drop into both eyes 5 (five) times daily as needed (for dry eyes).      . simvastatin (ZOCOR) 40 MG tablet Take 40 mg by mouth every evening.      . Skin Protectants, Misc. (EUCERIN) cream Apply 1 application topically as needed (for dry feet).      [name] spironolactone (ALDACTONE) 25 MG tablet Take 25 mg by mouth daily.      . penicillin v potassium (VEETID) 500 MG tablet Take 500 mg by mouth 4 (four) times daily.       No facility-administered medications prior to visit.    PHYSICAL EXAM Filed Vitals:   03/12/14 1401  BP: 188/90  Pulse: 70  Height: 6' 4.5" (1.943 m)  Weight: 247 lb 6.4 oz (112.22 kg)   Body mass index is 29.73 kg/(m^2). No exam data present No flowsheet data found.  No flowsheet data found.   General: well developed, well nourished, african american male seated, in no evident distress  Head: head normocephalic and atraumatic. Orohparynx benign  Neck: supple with no carotid or supraclavicular bruits  Cardiovascular: regular rate and rhythm, no murmurs  Musculoskeletal: no deformity  Skin: no rash/petichiae  Vascular: Normal pulses all extremities   Neurologic Exam  Mental Status: Awake and fully alert. Oriented to place and time. MOCA score last visit was 29/30 with 4/5 in delayed recall. Attention span, concentration and fund of knowledge appropriate. Mood and affect appropriate.  Cranial Nerves: Pupils equal, briskly reactive to light. Extraocular movements full without nystagmus. Visual fields full to confrontation. Hearing intact. Facial sensation intact. Face, tongue, palate moves normally and symmetrically.  Motor: Normal bulk and tone. Normal strength in all tested extremity muscles.diminished fine finger movements on left and orbits right over left upper extremity.  Sensory.: intact to tough and pinprick and vibratory.  Coordination: Rapid alternating movements  normal in all extremities. Finger-to-nose and heel-to-shin performed accurately bilaterally.  Gait and Station: Arises from chair without difficulty. Stance is normal. Gait demonstrates normal stride length, with some stiffness of the right leg.  Poor arm swing on the left. Able to heel, toe walk without difficulty. Tandem unsteady. Reflexes: 1+ and symmetric. Toes downgoing.   07/17/13 Mri brain without  Abnormal MRI scan the brain showing multiple nonspecific periventricular and subcortical nonspecific white matter hyperintensities with a differential discussed above. No enhancing lesions are noted. Compared with MRI scan dated 02/20/2013 the previously described cortical acute infarct shows expected evolutionary changes.   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 Vascular risk factors of HTN, Sleep Apnea and Diabetes. MOCA score is  29/30, memory problems are subjective only. He has had increasing problems with balance in the last few months.  Discussed Klonopin for RBD, he does not wish to try this at this time.  PLAN:  Continue Plavix 75 mg daily for stroke prevention with strict control of hypertension with blood pressure goal below 140/90, daibetes with Hgb a1c goal 7.0 or lower and lipids with LDL cholesterol goal below 100 mg percent. I also counseled him to use his CPAP machine every night to treat his sleep apnea.  We will send to Physical Therapy for Balance therapy. Return for followup in 6 months, sooner as needed.  Orders Placed This Encounter  Procedures  . Ambulatory referral to Physical Therapy   Return in about 6 months (around 09/12/2014) for stroke revisit.  Philmore Pali, MSN, NP-C 03/12/2014, 8:00 PM [facility] [address], [place], [place] [address] [phone]  Note: This document was prepared with digital dictation and  possible smart phrase technology. Any transcriptional errors that result from this process are unintentional.

## 2014-03-12 NOTE — Patient Instructions (Addendum)
Continue Plavix 75 mg daily for stroke prevention with strict control of hypertension with blood pressure goal below 140/90, daibetes with Hgb a1c goal 7.0 or lower and lipids with LDL cholesterol goal below 100 mg percent. I also counseled him to use his CPAP machine every night to treat his sleep apnea.   We will send to Physical Therapy for Balance therapy.  Return for followup in 6 months, sooner as needed.  Stroke Prevention Some medical conditions and behaviors are associated with an increased chance of having a stroke. You may prevent a stroke by making healthy choices and managing medical conditions. HOW CAN I REDUCE MY RISK OF HAVING A STROKE?   Stay physically active. Get at least 30 minutes of activity on most or all days.  Do not smoke. It may also be helpful to avoid exposure to secondhand smoke.  Limit alcohol use. Moderate alcohol use is considered to be:  No more than 2 drinks per day for men.  No more than 1 drink per day for nonpregnant women.  Eat healthy foods. This involves  Eating 5 or more servings of fruits and vegetables a day.  Following a diet that addresses high blood pressure (hypertension), high cholesterol, diabetes, or obesity.  Manage your cholesterol levels.  A diet low in saturated fat, trans fat, and cholesterol and high in fiber may control cholesterol levels.  Take any prescribed medicines to control cholesterol as directed by your health care provider.  Manage your diabetes.  A controlled-carbohydrate, controlled-sugar diet is recommended to manage diabetes.  Take any prescribed medicines to control diabetes as directed by your health care provider.  Control your hypertension.  A low-salt (sodium), low-saturated fat, low-trans fat, and low-cholesterol diet is recommended to manage hypertension.  Take any prescribed medicines to control hypertension as directed by your health care provider.  Maintain a healthy weight.  A  reduced-calorie, low-sodium, low-saturated fat, low-trans fat, low-cholesterol diet is recommended to manage weight.  Stop drug abuse.  Avoid taking birth control pills.  Talk to your health care provider about the risks of taking birth control pills if you are over 64 years old, smoke, get migraines, or have ever had a blood clot.  Get evaluated for sleep disorders (sleep apnea).  Talk to your health care provider about getting a sleep evaluation if you snore a lot or have excessive sleepiness.  Take medicines as directed by your health care provider.  For some people, aspirin or blood thinners (anticoagulants) are helpful in reducing the risk of forming abnormal blood clots that can lead to stroke. If you have the irregular heart rhythm of atrial fibrillation, you should be on a blood thinner unless there is a good reason you cannot take them.  Understand all your medicine instructions.  Make sure that other other conditions (such as anemia or atherosclerosis) are addressed. SEEK IMMEDIATE MEDICAL CARE IF:   You have sudden weakness or numbness of the face, arm, or leg, especially on one side of the body.  Your face or eyelid droops to one side.  You have sudden confusion.  You have trouble speaking (aphasia) or understanding.  You have sudden trouble seeing in one or both eyes.  You have sudden trouble walking.  You have dizziness.  You have a loss of balance or coordination.  You have a sudden, severe headache with no known cause.  You have new chest pain or an irregular heartbeat. Any of these symptoms may represent a serious problem that is an  emergency. Do not wait to see if the symptoms will go away. Get medical help at once. Call your local emergency services  (911 in [place] Do not drive yourself to the hospital. Document Released: 09/28/2004 Document Revised: 06/11/2013 Document Reviewed: 02/21/2013 [facility] Patient Information 2015 [place], [place]. This  information is not intended to replace advice given to you by your health care provider. Make sure you discuss any questions you have with your health care provider.

## 2014-03-15 NOTE — Progress Notes (Signed)
I agree with the above plan 

## 2014-03-17 ENCOUNTER — Encounter (HOSPITAL_COMMUNITY): Payer: Self-pay | Admitting: Emergency Medicine

## 2014-03-17 ENCOUNTER — Emergency Department (HOSPITAL_COMMUNITY): Payer: Medicare Other

## 2014-03-17 ENCOUNTER — Emergency Department (HOSPITAL_COMMUNITY)
Admission: EM | Admit: 2014-03-17 | Discharge: 2014-03-17 | Disposition: A | Discharge status: Home / Self Care | Payer: Medicare Other | Attending: Emergency Medicine | Admitting: Emergency Medicine

## 2014-03-17 DIAGNOSIS — R42 Dizziness and giddiness: Secondary | ICD-10-CM | POA: Insufficient documentation

## 2014-03-17 DIAGNOSIS — Z8673 Personal history of transient ischemic attack (TIA), and cerebral infarction without residual deficits: Secondary | ICD-10-CM | POA: Insufficient documentation

## 2014-03-17 DIAGNOSIS — R609 Edema, unspecified: Secondary | ICD-10-CM | POA: Insufficient documentation

## 2014-03-17 DIAGNOSIS — Z794 Long term (current) use of insulin: Secondary | ICD-10-CM | POA: Insufficient documentation

## 2014-03-17 DIAGNOSIS — Z7902 Long term (current) use of antithrombotics/antiplatelets: Secondary | ICD-10-CM | POA: Insufficient documentation

## 2014-03-17 DIAGNOSIS — R0602 Shortness of breath: Secondary | ICD-10-CM | POA: Insufficient documentation

## 2014-03-17 DIAGNOSIS — Z79899 Other long term (current) drug therapy: Secondary | ICD-10-CM | POA: Insufficient documentation

## 2014-03-17 DIAGNOSIS — R519 Headache, unspecified: Secondary | ICD-10-CM

## 2014-03-17 DIAGNOSIS — E1139 Type 2 diabetes mellitus with other diabetic ophthalmic complication: Secondary | ICD-10-CM | POA: Insufficient documentation

## 2014-03-17 DIAGNOSIS — E1142 Type 2 diabetes mellitus with diabetic polyneuropathy: Secondary | ICD-10-CM | POA: Insufficient documentation

## 2014-03-17 DIAGNOSIS — IMO0002 Reserved for concepts with insufficient information to code with codable children: Secondary | ICD-10-CM | POA: Insufficient documentation

## 2014-03-17 DIAGNOSIS — I10 Essential (primary) hypertension: Secondary | ICD-10-CM | POA: Insufficient documentation

## 2014-03-17 DIAGNOSIS — R51 Headache: Secondary | ICD-10-CM | POA: Insufficient documentation

## 2014-03-17 DIAGNOSIS — R079 Chest pain, unspecified: Secondary | ICD-10-CM | POA: Insufficient documentation

## 2014-03-17 DIAGNOSIS — E1149 Type 2 diabetes mellitus with other diabetic neurological complication: Secondary | ICD-10-CM | POA: Insufficient documentation

## 2014-03-17 LAB — PRO B NATRIURETIC PEPTIDE: PRO B NATRI PEPTIDE: 773 pg/mL — AB (ref 0–125)

## 2014-03-17 LAB — CBC
HEMATOCRIT: 34.2 % — AB (ref 39.0–52.0)
Hemoglobin: 11.7 g/dL — ABNORMAL LOW (ref 13.0–17.0)
MCH: 29.6 pg (ref 26.0–34.0)
MCHC: 34.2 g/dL (ref 30.0–36.0)
MCV: 86.6 fL (ref 78.0–100.0)
PLATELETS: 148 10*3/uL — AB (ref 150–400)
RBC: 3.95 MIL/uL — AB (ref 4.22–5.81)
RDW: 13.2 % (ref 11.5–15.5)
WBC: 4.4 10*3/uL (ref 4.0–10.5)

## 2014-03-17 LAB — BASIC METABOLIC PANEL
ANION GAP: 13 (ref 5–15)
BUN: 16 mg/dL (ref 6–23)
CALCIUM: 9.1 mg/dL (ref 8.4–10.5)
CHLORIDE: 100 meq/L (ref 96–112)
CO2: 27 meq/L (ref 19–32)
CREATININE: 1.41 mg/dL — AB (ref 0.50–1.35)
GFR calc Af Amer: 58 mL/min — ABNORMAL LOW (ref >90)
GFR calc non Af Amer: 50 mL/min — ABNORMAL LOW (ref >90)
Glucose, Bld: 239 mg/dL — ABNORMAL HIGH (ref 70–99)
Potassium: 3.5 mEq/L — ABNORMAL LOW (ref 3.7–5.3)
Sodium: 140 mEq/L (ref 137–147)

## 2014-03-17 LAB — URINALYSIS, ROUTINE W REFLEX MICROSCOPIC
Bilirubin Urine: NEGATIVE
GLUCOSE, UA: 500 mg/dL — AB
Ketones, ur: NEGATIVE mg/dL
LEUKOCYTES UA: NEGATIVE
Nitrite: NEGATIVE
PH: 5.5 (ref 5.0–8.0)
SPECIFIC GRAVITY, URINE: 1.026 (ref 1.005–1.030)
Urobilinogen, UA: 1 mg/dL (ref 0.0–1.0)

## 2014-03-17 LAB — URINE MICROSCOPIC-ADD ON

## 2014-03-17 LAB — I-STAT TROPONIN, ED: Troponin i, poc: 0.01 ng/mL (ref 0.00–0.08)

## 2014-03-17 MED ORDER — HYDROCODONE-ACETAMINOPHEN 5-325 MG PO TABS: 2.0000 | ORAL_TABLET | Freq: Once | ORAL | Status: DC

## 2014-03-17 MED ORDER — HYDROCODONE-ACETAMINOPHEN 5-325 MG PO TABS
1.0000 | ORAL_TABLET | Freq: Once | ORAL | Status: AC
  Administered 2014-03-17: 1 via ORAL
  Filled 2014-03-17: qty 1

## 2014-03-17 NOTE — Discharge Instructions (Signed)
1. Medications: usual home medications including compliance with your high blood pressure medications 2. Treatment: rest, drink plenty of fluids, decrease the salt in your diet 3. Follow Up: Please followup with your primary doctor for discussion of your diagnoses and further evaluation after today's visit; if you do not have a primary care doctor use the resource guide provided to find one; Please also follow-up with a cardiologist   Hypertension Hypertension, commonly called high blood pressure, is when the force of blood pumping through your arteries is too strong. Your arteries are the blood vessels that carry blood from your heart throughout your body. A blood pressure reading consists of a higher number over a lower number, such as 110/72. The higher number (systolic) is the pressure inside your arteries when your heart pumps. The lower number (diastolic) is the pressure inside your arteries when your heart relaxes. Ideally you want your blood pressure below 120/80. Hypertension forces your heart to work harder to pump blood. Your arteries may become narrow or stiff. Having hypertension puts you at risk for heart disease, stroke, and other problems.  RISK FACTORS Some risk factors for high blood pressure are controllable. Others are not.  Risk factors you cannot control include:   Race. You may be at higher risk if you are African American.  Age. Risk increases with age.  Gender. Men are at higher risk than women before age 78 years. After age 28, women are at higher risk than men. Risk factors you can control include:  Not getting enough exercise or physical activity.  Being overweight.  Getting too much fat, sugar, calories, or salt in your diet.  Drinking too much alcohol. SIGNS AND SYMPTOMS Hypertension does not usually cause signs or symptoms. Extremely high blood pressure (hypertensive crisis) may cause headache, anxiety, shortness of breath, and nosebleed. DIAGNOSIS  To  check if you have hypertension, your health care provider will measure your blood pressure while you are seated, with your arm held at the level of your heart. It should be measured at least twice using the same arm. Certain conditions can cause a difference in blood pressure between your right and left arms. A blood pressure reading that is higher than normal on one occasion does not mean that you need treatment. If one blood pressure reading is high, ask your health care provider about having it checked again. TREATMENT  Treating high blood pressure includes making lifestyle changes and possibly taking medication. Living a healthy lifestyle can help lower high blood pressure. You may need to change some of your habits. Lifestyle changes may include:  Following the DASH diet. This diet is high in fruits, vegetables, and whole grains. It is low in salt, red meat, and added sugars.  Getting at least 2 1/2 hours of brisk physical activity every week.  Losing weight if necessary.  Not smoking.  Limiting alcoholic beverages.  Learning ways to reduce stress. If lifestyle changes are not enough to get your blood pressure under control, your health care provider may prescribe medicine. You may need to take more than one. Work closely with your health care provider to understand the risks and benefits. HOME CARE INSTRUCTIONS  Have your blood pressure rechecked as directed by your health care provider.   Only take medicine as directed by your health care provider. Follow the directions carefully. Blood pressure medicines must be taken as prescribed. The medicine does not work as well when you skip doses. Skipping doses also puts you at risk for  problems.   Do not smoke.   Monitor your blood pressure at home as directed by your health care provider. SEEK MEDICAL CARE IF:   You think you are having a reaction to medicines taken.  You have recurrent headaches or feel dizzy.  You have  swelling in your ankles.  You have trouble with your vision. SEEK IMMEDIATE MEDICAL CARE IF:  You develop a severe headache or confusion.  You have unusual weakness, numbness, or feel faint.  You have severe chest or abdominal pain.  You vomit repeatedly.  You have trouble breathing. MAKE SURE YOU:   Understand these instructions.  Will watch your condition.  Will get help right away if you are not doing well or get worse. Document Released: 08/21/2005 Document Revised: 08/26/2013 Document Reviewed: 06/13/2013 [facility] Patient Information 2015 [place], [place]. This information is not intended to replace advice given to you by your health care provider. Make sure you discuss any questions you have with your health care provider.    Emergency Department Resource Guide 1) Find a Doctor and Pay Out of Pocket Although you won't have to find out who is covered by your insurance plan, it is a good idea to ask around and get recommendations. You will then need to call the office and see if the doctor you have chosen will accept you as a new patient and what types of options they offer for patients who are self-pay. Some doctors offer discounts or will set up payment plans for their patients who do not have insurance, but you will need to ask so you aren't surprised when you get to your appointment.  2) Contact Your Local Health Department Not all health departments have doctors that can see patients for sick visits, but many do, so it is worth a call to see if yours does. If you don't know where your local health department is, you can check in your phone book. The CDC also has a tool to help you locate your state's health department, and many state websites also have listings of all of their local health departments.  3) Find a [facility] If your illness is not likely to be very severe or complicated, you may want to try a walk in clinic. These are popping up all over the country in  pharmacies, drugstores, and shopping centers. They're usually staffed by nurse practitioners or physician assistants that have been trained to treat common illnesses and complaints. They're usually fairly quick and inexpensive. However, if you have serious medical issues or chronic medical problems, these are probably not your best option.  No Primary Care Doctor: - Call Health Connect at  [phone] - they can help you locate a primary care doctor that  accepts your insurance, provides certain services, etc. - Physician Referral Service- [phone]  Chronic Pain Problems: Organization         Address  Phone   Notes  [facility]  [phone] Patients need to be referred by their primary care doctor.   Medication Assistance: Organization         Address  Phone   Notes  [facility] Medication [facility] [address] [place], [place] [address] [phone] --Must be a resident of [facility] -- Must have NO insurance coverage whatsoever (no Medicaid/ Medicare, etc.) -- The pt. MUST have a primary care doctor that directs their care regularly and follows them in the community   [facility]ist  [phone]  [employer]  [phone]    Agencies that provide inexpensive medical care: Organization         Address  Phone   Notes  [place]  [phone]   [facility]    [phone]   [facility] [place], [place] [address] [phone]   [place] [address], [place] [phone]   Planned Parenthood    [phone]   [facility]    [phone]   [place] and [place] [address], [place] Phone:  [phone], Fax:  [phone] Hours of Operation:  9 am - 6 pm, M-F.  Also accepts Medicaid/Medicare and self-pay.  [facility] for [place], [address],  [place] Phone: [phone], Fax: [phone]. Hours of Operation:  8:30 am - 5:30 pm, M-F.  Also accepts Medicaid and self-pay.  [facility] [place] [address] [place] Phone: [phone]   [place], [place], [place] [phone], Ext. [phone] Mondays & Thursdays: 7-9 AM.  First 15 patients are seen on a first come, first serve basis.    [facility] Providers:  Organization         Address  Phone   Notes  [facility] [address], [place] [phone] Also accepts self-pay patients.  [facility] [id number] [place], [place]  [phone]   [place], [address], [place] [phone]   [facility] [address] [place] [phone]   Lucianne Lei [address], [place]   [phone] Only accepts [place] Access [place] patients after they have their name applied to their card.   Self-Pay (no insurance) in [facility]:  Organization         Address  Phone   Notes  Sickle Cell Patients, [facility] [place] [phone]   [facility] [place] [phone]   [facility] [place]  [place], [place], [place] [phone]   [facility]/Dr. Osei-Bonsu  [address], [place] or [address], [place] [phone] Phone number for both [place] and [place] locations is the same.  Urgent Medical and [facility] [address], [place] [phone]   [facility] [address], [place] or [address] [phone]   [facility] [address], [place] [phone], phone; [phone], fax Sees patients 1st and 3rd Saturday of every month.  Must not  qualify for public or private insurance (i.e. Medicaid, Medicare, [place] Health Choice, Veterans' Benefits)  Household income should be no more than 200% of the poverty level The clinic cannot treat you if you are pregnant or think you are pregnant  Sexually transmitted diseases are not treated at the clinic.    Dental Care: Organization         Address  Phone  Notes  [facility] Department of [facility] [place] [phone] Accepts children up to age 44 who are enrolled in [place] or [place]; pregnant women with a Medicaid card;  and children who have applied for Medicaid or [place] Health Choice, but were declined, whose parents can pay a reduced fee at time of service.  [facility] Department of [facility]  [address], [place] [phone] Accepts children up to age 50 who are enrolled in [place] or [place]; pregnant women with a Medicaid card; and children who have applied for Medicaid or [place] Health Choice, but were declined, whose parents can pay a reduced fee at time of service.  [place] Adult Dental Access PROGRAM  [place] [phone] Patients are seen by appointment only. Walk-ins are not accepted. [place] will see patients 29 years of age and older. Monday - Tuesday (8am-5pm) Most Wednesdays (8:30-5pm) $30 per visit, cash only  [facility] Adult Dental Access PROGRAM  [address], [facility] [phone] Patients are seen by appointment only. Walk-ins are not accepted. [place] will see patients 45 years of age and older. One Wednesday Evening (Monthly: Volunteer Based).  $30 per visit, cash only  [place]  [phone] for adults; Children under age 62, call Graduate Pediatric Dentistry at [phone]. Children aged 39-14, please call [phone] to request a pediatric application.  Dental services are provided  in all areas of dental care including fillings, crowns and bridges, complete and partial dentures, implants, gum treatment, root canals, and extractions. Preventive care is also provided. Treatment is provided to both adults and children. Patients are selected via a lottery and there is often a waiting list.   [facility] [address], [place]  [phone] [email or web address]   [facility] [address] [place], [place] [phone], Ext. [phone] Second and Fourth Thursday of each month, opens at 6:30 AM; Clinic ends at 9 AM.  Patients are seen on a first-come first-served basis, and a limited number are seen during each clinic.   [facility]  [address] Hillard Danker [place], [place] [phone]   Eligibility Requirements You must have lived in [place], [place], or [place] counties for at least the last three months.   You cannot be eligible for state or federal sponsored [employer], including [employer], [place], or [employer].   You generally cannot be eligible for healthcare insurance through your employer.    How to apply: Eligibility screenings are held every Tuesday and Wednesday afternoon from 1:00 pm until 4:00 pm. You do not need an appointment for the interview!  [facility] [address] [place], [place]   [place]  [facility] Department  [place]  [phone]    Behavioral Health Resources in the Community: Intensive Outpatient Programs Organization         Address  Phone  Notes  [place]. [address], [place], [place] [phone]   [facility]patient [address], [place], [place]   ADS: Alcohol & Drug Svcs [address] [place], [place]   [place] [address],  [place], [place] or [phone]   Substance Abuse Resources Organization         Address  Phone  Notes  Alcohol and Drug Services  [place]  [phone]   [facility] [place]   [facility]  [phone]  Residential & Outpatient Substance Abuse Program  [phone]   Psychological Services Organization         Address  Phone  Notes  [facility] [place]  [place]  [phone]   [place] [phone] N[address], [place] or [phone]    Mobile Crisis Teams Organization         Address  Phone  Notes  [facility], Mobile Crisis Care Unit  [phone]   [facility] Psychotherapeutic Services  [address] [place], [place]   [place] Levels [address], [place] [phone]    Self-Help/Support Groups Organization         Address  Phone             Notes  [place]. of [place] - variety of support groups  [place] Call for more information  Narcotics Anonymous (NA), [facility] [address], [employer] [place]  2 meetings at this location   Special educational needs teacher         Address  Phone  Notes  [facility] Residential Treatment [place],    [place]  [phone]   [employer] [place] [employer]  [address] [place] [phone], [place], [place]   [place], [place] [phone] Admissions: 8am-3pm M-F  Incentives Substance [place] [address],    [place], [place] [phone]   [facility] [address] [place], [place], [place]   [facility] [address],  [place], [place]   [facility] Programs - Intensive Outpatient [address], [place], [place]   [facility] ([place]  [place], [place] [phone] or  [phone]   [facility]idential Treatment Services (RTS) [address], [place], [place] Accepts Medicaid  [facility] [place] [address],  [place] [phone] Substance Abuse/Addiction Treatment   [facility] Organization         Address  Phone  Notes  [facility]  [phone]   Domenic Schwab, PhD [address] Arlis Porta [place], [place]   [phone] or [phone]   [place], [place] [phone]   [facility] [address] [place], [place] [phone] Insurance/Medicaid/sponsorship through [facility] and Families [address], [address] [place]                                    [place], [place] [phone] [place] [address] [place], [place] [phone]    Dr. Adele Schilder  [phone]   [facility] Dept. 1) [address] [place] 2) [place] [facility] 3)  [place] [address], [place] [phone]  [phone]   [place] [phone] or [phone] (After Hours)

## 2014-03-17 NOTE — ED Notes (Signed)
He states a home nurse came to do a wellness check on him today for insurance and she told him to come to ed for HTN 210/100 and for protein in urine. He states he has noticed swelling in his feet, soreness in his chest, and feeling lightheaded the past few days. hes A&Ox4, breathing easily now

## 2014-03-17 NOTE — ED Provider Notes (Signed)
CSN: [id number]     Arrival date & time 03/17/14  1604 History   First MD Initiated Contact with Patient 03/17/14 1706     Chief Complaint  Patient presents with  . Hypertension     (Consider location/radiation/quality/duration/timing/severity/associated sxs/prior Treatment) HPI  Cody Silva is a 68 y.o. male  with a hx of CVA (june 2014), HTN, neuropathy, IDDM presents to the Emergency Department complaining of gradual, persistent, progressively worsening mild, achy headache  Onset this morning after awaking.  Pt was assessed by [facility] RN who found him to be HTN.  Pt takes clonidine patch and PO medications which he took as usual.  Associated symptoms include swelling of the BLE and cough x 3-4 weeks. Pt denies vision changes.  Pt also endorses mild chest discomfort worse with palpation and some SOB on exertion in the last few weeks and is sleeping on more pillows.  He has been wearing a CPAP at night.  Movement and exertion makes it worse and nothing makes it better.  Pt reports he feels fine at this time. Pt denies fever, chills, headache, neck pain, abd pain, N/V/D, weakness, dizziness, syncope.  Pt's son reports that he sometimes forgets to take his medications, but pt reports compliance recently.  Pt also reports that he cannot taste his food and thus salts it heavily; he reports eating [place] food last night. He denies any illicit drug use.    Past Medical History  Diagnosis Date  . Hypertension   . Diabetes mellitus without complication   . Diabetic retinopathy   . Diabetic neuropathy   . Stroke     left sided deficits   Past Surgical History  Procedure Laterality Date  . Eye surgery    . Arm surgery      Tumor removed    Family History  Problem Relation Age of Onset  . Diabetes Mother   . Breast cancer Mother   . Stroke Brother   . Neurofibromatosis Maternal Uncle    History  Substance Use Topics  . Smoking status: Never Smoker   . Smokeless tobacco:  Never Used  . Alcohol Use: Yes     Comment: rare    Review of Systems  Constitutional: Negative for fever, diaphoresis, appetite change, fatigue and unexpected weight change.  HENT: Negative for mouth sores.   Eyes: Negative for visual disturbance.  Respiratory: Positive for shortness of breath. Negative for cough, chest tightness and wheezing.   Cardiovascular: Positive for chest pain and leg swelling.  Gastrointestinal: Negative for nausea, vomiting, abdominal pain, diarrhea and constipation.  Endocrine: Negative for polydipsia, polyphagia and polyuria.  Genitourinary: Negative for dysuria, urgency, frequency and hematuria.  Musculoskeletal: Negative for back pain and neck stiffness.  Skin: Negative for rash.  Allergic/Immunologic: Negative for immunocompromised state.  Neurological: Positive for light-headedness and headaches. Negative for syncope.  Hematological: Does not bruise/bleed easily.  Psychiatric/Behavioral: Negative for sleep disturbance. The patient is not nervous/anxious.       Allergies  Metformin and related  Home Medications   Prior to Admission medications   Medication Sig Start Date End Date Taking? Authorizing Provider  albuterol (PROVENTIL HFA;VENTOLIN HFA) 108 (90 BASE) MCG/ACT inhaler Inhale 2 puffs into the lungs every 6 (six) hours as needed for wheezing.   Yes Historical Provider, MD  carvedilol (COREG) 25 MG tablet Take 25 mg by mouth 2 (two) times daily with a meal.   Yes Historical Provider, MD  citalopram (CELEXA) 40 MG tablet Take 20  mg by mouth daily.   Yes Historical Provider, MD  cloNIDine (CATAPRES - DOSED IN MG/24 HR) 0.3 mg/24hr Place 1 patch onto the skin once a week.   Yes Historical Provider, MD  clopidogrel (PLAVIX) 75 MG tablet Take 75 mg by mouth at bedtime.    Yes Historical Provider, MD  fluocinonide cream (LIDEX) [id number] % Apply 1 application topically 2 (two) times daily.   Yes Historical Provider, MD  fluticasone (FLONASE) 50 MCG/ACT  nasal spray Place 2 sprays into the nose daily.   Yes Historical Provider, MD  gabapentin (NEURONTIN) 300 MG capsule Take 300 mg by mouth daily as needed (leg pain).    Yes Historical Provider, MD  insulin aspart (NOVOLOG) 100 UNIT/ML injection Inject 10 Units into the skin 3 (three) times daily with meals.   Yes Historical Provider, MD  insulin glargine (LANTUS) 100 UNIT/ML injection Inject 50 Units into the skin at bedtime.   Yes Historical Provider, MD  latanoprost (XALATAN) 0.005 % ophthalmic solution Place 1 drop into the left eye at bedtime.   Yes Historical Provider, MD  losartan (COZAAR) 100 MG tablet Take 100 mg by mouth daily.   Yes Historical Provider, MD  Multiple Vitamins-Minerals (MULTIVITAMIN PO) Take 1 tablet by mouth daily.   Yes Historical Provider, MD  polyvinyl alcohol (LIQUIFILM TEARS) 1.4 % ophthalmic solution Place 1 drop into both eyes 5 (five) times daily as needed (for dry eyes).   Yes Historical Provider, MD  simvastatin (ZOCOR) 40 MG tablet Take 40 mg by mouth every evening.   Yes Historical Provider, MD  Skin Protectants, Misc. (EUCERIN) cream Apply 1 application topically as needed (for dry feet).   Yes Historical Provider, MD  spironolactone (ALDACTONE) 25 MG tablet Take 25 mg by mouth daily.   Yes Historical Provider, MD   BP 191/103  Pulse 72  Temp(Src) 98.3 F (36.8 C) (Oral)  Resp 24  Ht 6' 4.5" (1.943 m)  Wt 246 lb (111.585 kg)  BMI 29.56 kg/m2  SpO2 100% Physical Exam  Nursing note and vitals reviewed. Constitutional: He is oriented to person, place, and time. He appears well-developed and well-nourished. No distress.  Awake, alert, nontoxic appearance  HENT:  Head: Normocephalic and atraumatic.  Mouth/Throat: Oropharynx is clear and moist. No oropharyngeal exudate.  Eyes: Conjunctivae and EOM are normal. Pupils are equal, round, and reactive to light. No scleral icterus.  No horizontal, vertical or rotational nystagmus  Neck: Normal range of motion.  Neck supple.  Full active and passive ROM without pain No midline or paraspinal tenderness No nuchal rigidity or meningeal signs  Cardiovascular: Normal rate, regular rhythm, normal heart sounds and intact distal pulses.   Pulses:      Radial pulses are 2+ on the right side, and 2+ on the left side.  Capillary refill < 3 sec Unable to palpation DP or PT pulses due to edema  Pulmonary/Chest: Effort normal and breath sounds normal. No respiratory distress. He has no wheezes. He has no rales.  Abdominal: Soft. Bowel sounds are normal. He exhibits no mass. There is no tenderness. There is no rebound and no guarding.  Musculoskeletal: Normal range of motion. He exhibits edema (BLE from forefoot to midshaft of the tibia, pitting).  Lymphadenopathy:    He has no cervical adenopathy.  Neurological: He is alert and oriented to person, place, and time. He has normal reflexes. No cranial nerve deficit. He exhibits normal muscle tone. Coordination normal.  Mental Status:  Alert, oriented, thought content  appropriate. Speech fluent without evidence of aphasia. Able to follow 2 step commands without difficulty.  Cranial Nerves:  II:  Peripheral visual fields grossly normal, pupils equal, round, reactive to light III,IV, VI: ptosis not present, extra-ocular motions intact bilaterally  V,VII: smile symmetric, facial light touch sensation equal VIII: hearing grossly normal bilaterally  IX,X: gag reflex present  XI: bilateral shoulder shrug equal and strong XII: midline tongue extension  Motor:  5/5 in left upper and lower extremities bilaterally including  grip strength and dorsiflexion/plantar flexion 4/5 in right upper and lower extremities bilaterally including  grip strength and dorsiflexion/plantar flexion - pt reports baseline after CVA Sensory: Pinprick and light touch normal in all extremities.  Deep Tendon Reflexes: 2+ and symmetric  Cerebellar: normal finger-to-nose with bilateral upper  extremities Gait: normal gait and balance CV: distal pulses palpable throughout   Skin: Skin is warm and dry. No rash noted. He is not diaphoretic.  Psychiatric: He has a normal mood and affect. His behavior is normal. Judgment and thought content normal.    ED Course  Procedures (including critical care time) Labs Review Labs Reviewed  CBC - Abnormal; Notable for the following:    RBC 3.95 (*)    Hemoglobin 11.7 (*)    HCT 34.2 (*)    Platelets 148 (*)    All other components within normal limits  BASIC METABOLIC PANEL - Abnormal; Notable for the following:    Potassium 3.5 (*)    Glucose, Bld 239 (*)    Creatinine, Ser 1.41 (*)    GFR calc non Af Amer 50 (*)    GFR calc Af Amer 58 (*)    All other components within normal limits  URINALYSIS, ROUTINE W REFLEX MICROSCOPIC - Abnormal; Notable for the following:    Glucose, UA 500 (*)    Hgb urine dipstick MODERATE (*)    Protein, ur >300 (*)    All other components within normal limits  PRO B NATRIURETIC PEPTIDE - Abnormal; Notable for the following:    Pro B Natriuretic peptide (BNP) 773.0 (*)    All other components within normal limits  URINE MICROSCOPIC-ADD ON  Randolm Idol, ED    Imaging Review Dg Chest 2 View  03/17/2014   CLINICAL DATA:  Dizziness  EXAM: CHEST  2 VIEW  COMPARISON:  PA and lateral chest x-ray of February 21, 2013  FINDINGS: The lungs are well-expanded and clear. The cardiac silhouette is top-normal in size. The pulmonary vascularity is not engorged. The mediastinum is normal in width. There is mild tortuosity of the descending thoracic aorta. The bony thorax is unremarkable.  IMPRESSION: There is no acute cardiopulmonary abnormality.   Electronically Signed   By: David  Martinique   On: 03/17/2014 16:57   Ct Head Wo Contrast  03/17/2014   CLINICAL DATA:  Dizziness and hypertension  EXAM: CT HEAD WITHOUT CONTRAST  TECHNIQUE: Contiguous axial images were obtained from the base of the skull through the vertex  without intravenous contrast.  COMPARISON:  Head CT April 22, 2013 and brain MRI July 17, 2013  FINDINGS: Ventricles are normal in size and configuration. There is no mass, hemorrhage, extra-axial fluid collection, or midline shift. There is mild patchy small vessel disease in the centra semiovale bilaterally. There is no new gray-white compartment lesion. No acute infarct apparent. The bony calvarium appears intact. The mastoid air cells are clear.  IMPRESSION: Stable patchy periventricular small vessel disease. No intracranial mass, hemorrhage, or acute appearing infarct.  Electronically Signed   By: Lowella Grip M.D.   On: 03/17/2014 19:30     EKG Interpretation None      ECG;  Date: 03/17/2014  Rate: 73  Rhythm: normal sinus rhythm  QRS Axis: normal  Intervals: normal  ST/T Wave abnormalities: nonspecific ST/T changes  Conduction Disutrbances:none  Narrative Interpretation: nonischemic ECG; unchanged from 04/22/13  Old EKG Reviewed: unchanged    MDM   Final diagnoses:  Essential hypertension  Headache, unspecified headache type    Edward Qualia presents with headache, leg swelling, reproducible chest pain and SOB for several weeks.  Today he was found to be hypertensive prompting the visit to the ED. Pt is well appearing, in NAD.  Pt with baseline neurologic exam after CVA.  He is taking plavix as directed at home.  Urinalysis without evidence of urinary tract infection however increased protein. Troponin negative, ECG nonischemic, CBC with mild anemia and BMP with slightly elevated creatinine not far from baseline.  Patient also with hyperglycemia, however the patient is an insulin-dependent diabetic.  CT head without acute abnormality. Highly doubt CVA at this time.  Patient with intermittent compliance with medications, large amounts of salt intake; the combination of which is likely to be causing her hypertension noted today. Patient remains hypertensive here in  the emergency department,  but there are no signs of hypertensive urgency.  Discussed with patient the need for close follow-up and management by their primary care physician.  Patient given resources for this.  Patient also a slightly elevated BNP and I suggested followup with cardiology for echocardiogram.  I have personally reviewed patient's vitals, nursing note and any pertinent labs or imaging.  I performed an undressed physical exam.    At this time, it has been determined that no acute conditions requiring further emergency intervention. The patient/guardian have been advised of the diagnosis and plan. I reviewed all labs and imaging including any potential incidental findings. We have discussed signs and symptoms that warrant return to the ED, such as increasing CP, SOB or sudden onset headache.  Patient/guardian has voiced understanding and agreed to follow-up with the PCP or specialist in 2-3 days.  Vital signs are stable at discharge.   BP 191/103  Pulse 72  Temp(Src) 98.3 F (36.8 C) (Oral)  Resp 24  Ht 6' 4.5" (1.943 m)  Wt 246 lb (111.585 kg)  BMI 29.56 kg/m2  SpO2 100%  The patient was discussed with and seen by Dr. Venora Maples who agrees with the treatment plan.     Jarrett Soho Sander Speckman, PA-C 03/17/14 2059  Abigail Butts, PA-C 03/18/14 [id number]

## 2014-03-20 ENCOUNTER — Telehealth: Payer: Self-pay | Admitting: Nurse Practitioner

## 2014-03-20 NOTE — Telephone Encounter (Signed)
Patient has a question--could diabetes have something to do with patient having a stroke?

## 2014-03-21 NOTE — ED Provider Notes (Signed)
Medical screening examination/treatment/procedure(s) were conducted as a shared visit with non-physician practitioner(s) and myself.  I personally evaluated the patient during the encounter.   EKG Interpretation   Date/Time:  Tuesday March 17 2014 16:23:08 EDT Ventricular Rate:  73 PR Interval:  174 QRS Duration: 104 QT Interval:  410 QTC Calculation: 451 R Axis:   41 Text Interpretation:  Normal sinus rhythm Possible Left atrial enlargement  Nonspecific ST and T wave abnormality Abnormal ECG ED PHYSICIAN  INTERPRETATION AVAILABLE IN [facility] Confirmed by TEST, Record  ([id number]) on 03/19/2014 7:39:44 AM     Overall the patient is well-appearing.  Headache improved after medication in the emergency department.  I suspect his hypertension is secondary to intermittent compliance and dietary indiscretion.  Long discussion was had with the patient regarding dietary changes which can be made to help his blood pressure.  PCP followup.   Dg Chest 2 View  03/17/2014   CLINICAL DATA:  Dizziness  EXAM: CHEST  2 VIEW  COMPARISON:  PA and lateral chest x-ray of February 21, 2013  FINDINGS: The lungs are well-expanded and clear. The cardiac silhouette is top-normal in size. The pulmonary vascularity is not engorged. The mediastinum is normal in width. There is mild tortuosity of the descending thoracic aorta. The bony thorax is unremarkable.  IMPRESSION: There is no acute cardiopulmonary abnormality.   Electronically Signed   By: David  Martinique   On: 03/17/2014 16:57   Ct Head Wo Contrast  03/17/2014   CLINICAL DATA:  Dizziness and hypertension  EXAM: CT HEAD WITHOUT CONTRAST  TECHNIQUE: Contiguous axial images were obtained from the base of the skull through the vertex without intravenous contrast.  COMPARISON:  Head CT April 22, 2013 and brain MRI July 17, 2013  FINDINGS: Ventricles are normal in size and configuration. There is no mass, hemorrhage, extra-axial fluid collection, or midline shift.  There is mild patchy small vessel disease in the centra semiovale bilaterally. There is no new gray-white compartment lesion. No acute infarct apparent. The bony calvarium appears intact. The mastoid air cells are clear.  IMPRESSION: Stable patchy periventricular small vessel disease. No intracranial mass, hemorrhage, or acute appearing infarct.   Electronically Signed   By: Lowella Grip M.D.   On: 03/17/2014 19:30    Hoy Morn, MD 03/21/14 0120

## 2014-03-24 NOTE — Telephone Encounter (Signed)
Spoke to patient. Answered questions to best of ability. Patient requested copy of last OV note [facility]. Left at front desk for pick up.

## 2014-03-26 ENCOUNTER — Telehealth: Payer: Self-pay | Admitting: Nurse Practitioner

## 2014-03-26 NOTE — Telephone Encounter (Signed)
Patient requesting Larey Seat call regarding how diabetes was associated with brian tumor.   She told him yesterday, but he doesn't remember what she said.  Needing a response today

## 2014-03-26 NOTE — Telephone Encounter (Signed)
Patient calling back, needing a returned call.  Thanks

## 2014-03-26 NOTE — Telephone Encounter (Signed)
Patient called back. Went over diabetes and stroke relationship. Patient verbalized understanding. Advised to research info also. Patient agreed.

## 2014-03-26 NOTE — Telephone Encounter (Signed)
Returned call. No answer. No message left.

## 2014-03-27 ENCOUNTER — Ambulatory Visit: Payer: Medicare Other | Attending: Nurse Practitioner | Admitting: Rehabilitative and Restorative Service Providers"

## 2014-03-27 DIAGNOSIS — I10 Essential (primary) hypertension: Secondary | ICD-10-CM | POA: Diagnosis not present

## 2014-03-27 DIAGNOSIS — E1149 Type 2 diabetes mellitus with other diabetic neurological complication: Secondary | ICD-10-CM | POA: Diagnosis not present

## 2014-03-27 DIAGNOSIS — R269 Unspecified abnormalities of gait and mobility: Secondary | ICD-10-CM | POA: Diagnosis not present

## 2014-03-27 DIAGNOSIS — Z8673 Personal history of transient ischemic attack (TIA), and cerebral infarction without residual deficits: Secondary | ICD-10-CM | POA: Diagnosis not present

## 2014-03-27 DIAGNOSIS — IMO0001 Reserved for inherently not codable concepts without codable children: Secondary | ICD-10-CM | POA: Insufficient documentation

## 2014-03-27 DIAGNOSIS — E1142 Type 2 diabetes mellitus with diabetic polyneuropathy: Secondary | ICD-10-CM | POA: Insufficient documentation

## 2014-03-27 DIAGNOSIS — H9319 Tinnitus, unspecified ear: Secondary | ICD-10-CM | POA: Insufficient documentation

## 2014-03-27 DIAGNOSIS — M6281 Muscle weakness (generalized): Secondary | ICD-10-CM | POA: Diagnosis not present

## 2014-03-31 ENCOUNTER — Ambulatory Visit: Payer: Medicare Other | Admitting: Rehabilitative and Restorative Service Providers"

## 2014-03-31 ENCOUNTER — Ambulatory Visit: Payer: Self-pay | Admitting: Ophthalmology

## 2014-03-31 DIAGNOSIS — IMO0001 Reserved for inherently not codable concepts without codable children: Secondary | ICD-10-CM | POA: Diagnosis not present

## 2014-04-01 ENCOUNTER — Telehealth: Payer: Self-pay | Admitting: Neurology

## 2014-04-01 NOTE — Telephone Encounter (Signed)
Patient calling wanting to get more clarification about a report for diabetes and stroke, please return call to patient and advise.

## 2014-04-01 NOTE — Telephone Encounter (Signed)
Returned call to patient concerning clarification about diabetes/stroke report, lt VM message

## 2014-04-06 NOTE — Telephone Encounter (Signed)
Called patient he he states he has the answers he needs.[name]

## 2014-04-07 ENCOUNTER — Ambulatory Visit: Payer: Self-pay | Admitting: Ophthalmology

## 2014-04-15 ENCOUNTER — Ambulatory Visit: Payer: Medicare Other | Attending: Nurse Practitioner | Admitting: Rehabilitative and Restorative Service Providers"

## 2014-04-15 DIAGNOSIS — Z8673 Personal history of transient ischemic attack (TIA), and cerebral infarction without residual deficits: Secondary | ICD-10-CM | POA: Insufficient documentation

## 2014-04-15 DIAGNOSIS — E1149 Type 2 diabetes mellitus with other diabetic neurological complication: Secondary | ICD-10-CM | POA: Insufficient documentation

## 2014-04-15 DIAGNOSIS — M6281 Muscle weakness (generalized): Secondary | ICD-10-CM | POA: Insufficient documentation

## 2014-04-15 DIAGNOSIS — R269 Unspecified abnormalities of gait and mobility: Secondary | ICD-10-CM | POA: Diagnosis not present

## 2014-04-15 DIAGNOSIS — H9319 Tinnitus, unspecified ear: Secondary | ICD-10-CM | POA: Insufficient documentation

## 2014-04-15 DIAGNOSIS — IMO0001 Reserved for inherently not codable concepts without codable children: Secondary | ICD-10-CM | POA: Insufficient documentation

## 2014-04-15 DIAGNOSIS — I10 Essential (primary) hypertension: Secondary | ICD-10-CM | POA: Diagnosis not present

## 2014-04-15 DIAGNOSIS — E1142 Type 2 diabetes mellitus with diabetic polyneuropathy: Secondary | ICD-10-CM | POA: Insufficient documentation

## 2014-04-17 ENCOUNTER — Ambulatory Visit: Payer: Medicare Other | Admitting: Physical Therapy

## 2014-04-17 DIAGNOSIS — IMO0001 Reserved for inherently not codable concepts without codable children: Secondary | ICD-10-CM | POA: Diagnosis not present

## 2014-04-21 ENCOUNTER — Ambulatory Visit: Payer: Medicare Other | Admitting: Physical Therapy

## 2014-04-21 DIAGNOSIS — IMO0001 Reserved for inherently not codable concepts without codable children: Secondary | ICD-10-CM | POA: Diagnosis not present

## 2014-04-23 ENCOUNTER — Ambulatory Visit: Payer: Medicare Other | Admitting: Physical Therapy

## 2014-04-28 ENCOUNTER — Encounter: Payer: 59 | Admitting: Rehabilitative and Restorative Service Providers"

## 2014-04-30 ENCOUNTER — Ambulatory Visit: Payer: Medicare Other | Admitting: Rehabilitative and Restorative Service Providers"

## 2014-04-30 DIAGNOSIS — IMO0001 Reserved for inherently not codable concepts without codable children: Secondary | ICD-10-CM | POA: Diagnosis not present

## 2014-05-05 ENCOUNTER — Encounter: Payer: 59 | Admitting: Rehabilitative and Restorative Service Providers"

## 2014-05-07 ENCOUNTER — Encounter: Payer: 59 | Admitting: Rehabilitative and Restorative Service Providers"

## 2014-05-28 ENCOUNTER — Telehealth: Payer: Self-pay | Admitting: *Deleted

## 2014-05-28 NOTE — Telephone Encounter (Signed)
Form,dmv parking placard sent to nurse 05-28-14.

## 2014-06-02 NOTE — Telephone Encounter (Signed)
To L Lam, NP 06-03-14.

## 2014-06-03 NOTE — Telephone Encounter (Signed)
Signed and completed, call patient to pick up.

## 2014-06-04 ENCOUNTER — Telehealth: Payer: Self-pay | Admitting: *Deleted

## 2014-06-04 NOTE — Telephone Encounter (Signed)
Form, DMV Handicapped Placard received 06-04-14 from Cody Silva, called patient to pick up.

## 2014-09-21 ENCOUNTER — Encounter: Payer: Self-pay | Admitting: Neurology

## 2014-09-21 ENCOUNTER — Ambulatory Visit (INDEPENDENT_AMBULATORY_CARE_PROVIDER_SITE_OTHER): Payer: Medicare Other | Admitting: Neurology

## 2014-09-21 VITALS — BP 155/85 | HR 65 | Ht 76.5 in | Wt 235.6 lb

## 2014-09-21 DIAGNOSIS — R260 Ataxic gait: Secondary | ICD-10-CM | POA: Insufficient documentation

## 2014-09-21 NOTE — Patient Instructions (Signed)
I had a long discussion with the patient and his wife and counseled him to use his CPAP regularly. He plans to go to the [place] to get it calibrated and use it more regularly. Continue Plavix for stroke prevention and maintain strict control of hypertension with blood pressure goal below 120/80, lipids with LDL cholesterol goal below 70 mg percent and diabetes with hemoglobin A1c goal below 6.5. The patient may stop Plavix for 3 days prior to scheduled dental procedure next month with a small but acceptable risk of peri-procedure TIA/stroke. Check follow-up carotid ultrasound study. Return for follow-up in 6 months or call earlier if necessary

## 2014-09-21 NOTE — Progress Notes (Signed)
PATIENT: Cody Silva CURRENT DOB: Sep 20, 1945  REASON FOR VISIT: routine follow up for stroke HISTORY FROM: patient  HISTORY OF PRESENT ILLNESS: 69 year male seen for first office f/u visit after [facility] admission on 02/20/2013.he presented with 3 month h/o intermittent slurred speech, leg weakness and 1 episode of syncope.he was evaluated at the [facility] clinic and had several head CTs which were unremarkable.he had 2 episodes of falling on day of admission and MRI brain showed a large 4 x 4 cm diffusion positive lesion in corpus callosum involving right more than left cingulate guyrus with only faint enhancement. It was unclear wether this was a callosal branch anterior cerebral artery infarct or a mass lesion.MRA showed decrease flow in median artery of corpus callosum.Carotid dopplers and 2DEcho showed normal ejection fraction. EEG was normal. Lipid profile was normal. HbA1c was elevated at 7.5% He is tolerating plavix well without bleeding or side effects.He states his gait and balance have improved and he is walking well. He notices some imbalance when he is in a hurry or tired but is able to catch himself without falling. He has sleep apnea but is not using his CPAP mask daily.  UPDATE 09/12/13 (LL): Cody Silva returns to office for stroke revisit. He states he has been doing well, having some headaches but not too often. He complains that his memory is not as well as before the stroke. His wife does not notice any problem with his memory. His follow up MRI showed expected evolutionary changes, no mention of mass lesion. He is tolerating Plavix well but has stopped currently to have a tooth extracted early next week. His BP is elevated in office today, at 166/92, but he says it is usually lower.   UPDATE 03/12/14 (LL): Since last visit, he has not had any new neurovascular symptoms. He has started to have more difficulties with imbalance. 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 His  blood pressure is elevated in the office today, 188/90.  He states it is not that high when he takes it at home. He is having progressively more acting out dreams, but his wife states that he does not get out of bed.  He has PTSD from being in the [place] War, and has had treatment through the [place]. He does not wear his cpap consistently. His blood sugars in the morning range from 130-170, but he has had some problems with lows as well.  He is tolerating Plavix well with no signs of significant bleeding or bruising. Update 09/21/2014 : He returns for follow-up after last visit 6 months ago. She continues to have poor balance. He did go for outpatient physical therapy following the last visit but it did not seem to help a lot. The patient's wife states that he had a transient episode of facial droop as well as increased imbalance about a month ago but they did not seek medical help at that time. The patient continues not to use his CPAP regularly but he does plan to go to the [place] and get his recalibrated so it fits better. He remains on Plavix which is tolerating well but plans to get an elective dental procedure and wants to stop it for 3 days prior to the procedure. He states his fasting sugars have been quite good and last hemoglobin A1c was 7.0  3 months ago. His blood pressure has been well controlled. His last lipid profile was also fine. He has not  had a carotid Doppler done since he left the hospital a year and a half ago.  REVIEW OF SYSTEMS: Full 14 system review of systems performed and notable only for:   14 system review of systems is positive for light sensitivity, eye itching, hearing loss, ringing in the ears, shortness of breath, choking, cold intolerance, frequency of urination, bladder urgency, joint tenderness and pain, walking difficulty, neck stiffness, apnea, snoring, daytime sleepiness, acting out dreams, memory loss, dizziness, headache, numbness, speech difficulty, weakness, tremors, facial  drooping, agitation and confusion and all other systems negative   ALLERGIES: Allergies  Allergen Reactions  . Metformin And Related     HOME MEDICATIONS: Outpatient Prescriptions Prior to Visit  Medication Sig Dispense Refill  . albuterol (PROVENTIL HFA;VENTOLIN HFA) 108 (90 BASE) MCG/ACT inhaler Inhale 2 puffs into the lungs every 6 (six) hours as needed for wheezing.    . carvedilol (COREG) 25 MG tablet Take 25 mg by mouth 2 (two) times daily with a meal.    . cloNIDine (CATAPRES - DOSED IN MG/24 HR) 0.3 mg/24hr Place 1 patch onto the skin once a week.    . clopidogrel (PLAVIX) 75 MG tablet Take 75 mg by mouth at bedtime.     . fluocinonide cream (LIDEX) AB-123456789 % Apply 1 application topically 2 (two) times daily.    . fluticasone (FLONASE) 50 MCG/ACT nasal spray Place 2 sprays into the nose daily.    [name] gabapentin (NEURONTIN) 300 MG capsule Take 300 mg by mouth daily as needed (leg pain).     . insulin aspart (NOVOLOG) 100 UNIT/ML injection Inject 10 Units into the skin 3 (three) times daily with meals.    . insulin glargine (LANTUS) 100 UNIT/ML injection Inject 50 Units into the skin at bedtime.    [name] latanoprost (XALATAN) 0.005 % ophthalmic solution Place 1 drop into the left eye at bedtime.    [name] losartan (COZAAR) 100 MG tablet Take 100 mg by mouth daily.    . Multiple Vitamins-Minerals (MULTIVITAMIN PO) Take 1 tablet by mouth daily.    . polyvinyl alcohol (LIQUIFILM TEARS) 1.4 % ophthalmic solution Place 1 drop into both eyes 5 (five) times daily as needed (for dry eyes).    . simvastatin (ZOCOR) 40 MG tablet Take 40 mg by mouth every evening.    . Skin Protectants, Misc. (EUCERIN) cream Apply 1 application topically as needed (for dry feet).    [name] spironolactone (ALDACTONE) 25 MG tablet Take 25 mg by mouth daily.    . citalopram (CELEXA) 40 MG tablet Take 20 mg by mouth daily.     No facility-administered medications prior to visit.    PHYSICAL EXAM Filed Vitals:   09/21/14 1031    BP: 155/85  Pulse: 65  Height: 6' 4.5" (1.943 m)  Weight: 235 lb 9.6 oz (106.867 kg)   Body mass index is 28.31 kg/(m^2). No exam data present No flowsheet data found.  No flowsheet data found.   General: well developed, well nourished, african american male seated, in no evident distress  Head: head normocephalic and atraumatic. Orohparynx benign  Neck: supple with no carotid or supraclavicular bruits  Cardiovascular: regular rate and rhythm, no murmurs  Musculoskeletal: no deformity  Skin: no rash/petichiae  Vascular: Normal pulses all extremities   Neurologic Exam  Mental Status: Awake and fully alert. Oriented to place and time.  Attention span, concentration and fund of knowledge appropriate. Mood and affect appropriate.  Cranial Nerves: Pupils equal, briskly reactive to light. Extraocular  movements full without nystagmus. Visual fields full to confrontation. Hearing intact. Facial sensation intact. Face, tongue, palate moves normally and symmetrically.  Motor: Normal bulk and tone. Normal strength in all tested extremity muscles.diminished fine finger movements on left and orbits right over left upper extremity.  Sensory.: intact to tough and pinprick and vibratory.  Coordination: Rapid alternating movements normal in all extremities. Finger-to-nose and heel-to-shin performed accurately bilaterally.  Gait and Station: Arises from chair without difficulty. Stance is normal. Gait demonstrates normal stride length, with some stiffness of the right leg.  Poor arm swing on the left. Unable to heel, toe walk without difficulty. Tandem unsteady. Reflexes: 1+ and symmetric. Toes downgoing.   07/17/13 Mri brain without  Abnormal MRI scan the brain showing multiple nonspecific periventricular and subcortical nonspecific white matter hyperintensities with a differential discussed above. No enhancing lesions are noted. Compared with MRI scan dated 02/20/2013 the previously described cortical  acute infarct shows expected evolutionary changes.   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 Vascular risk factors of HTN, Sleep Apnea and Diabetes.  . He has had increasing problems with balance in the last few months.   PLAN:  I had a long discussion with the patient and his wife and counseled him to use his CPAP regularly. He plans to go to the [place] to get it calibrated and use it more regularly. Continue Plavix for stroke prevention and maintain strict control of hypertension with blood pressure goal below 120/80, lipids with LDL cholesterol goal below 70 mg percent and diabetes with hemoglobin A1c goal below 6.5. The patient may stop Plavix for 3 days prior to scheduled dental procedure next month with a small but acceptable risk of peri-procedure TIA/stroke. Check follow-up carotid ultrasound study. Return for follow-up in 6 months or call earlier if necessary   Orders Placed This Encounter  Procedures  . US Carotid Bilateral   Return in about 6 months (around 03/22/2015).  Antony Contras, MD  09/21/2014, 1:34 PM [facility] [address], [place], [place] [address] [phone]  Note: This document was prepared with digital dictation and possible smart phrase technology. Any transcriptional errors that result from this process are unintentional.

## 2014-09-24 ENCOUNTER — Ambulatory Visit (INDEPENDENT_AMBULATORY_CARE_PROVIDER_SITE_OTHER): Payer: Medicare Other

## 2014-09-24 ENCOUNTER — Telehealth: Payer: Self-pay | Admitting: *Deleted

## 2014-09-24 DIAGNOSIS — R260 Ataxic gait: Secondary | ICD-10-CM

## 2014-09-24 DIAGNOSIS — I639 Cerebral infarction, unspecified: Secondary | ICD-10-CM

## 2014-09-24 NOTE — Telephone Encounter (Signed)
Form,[employer] Parking Placard to Tashia and Dr Leonie Man to be completed 09-24-14.

## 2014-09-28 NOTE — Telephone Encounter (Signed)
Form,[employer] Parking Placard received,completed by Dr Leonie Man and Charisse March 09-28-14.

## 2014-09-29 NOTE — Telephone Encounter (Signed)
Form completed and returned back to medical records on 09/28/14

## 2014-10-30 ENCOUNTER — Inpatient Hospital Stay (HOSPITAL_COMMUNITY)
Admission: EM | Admit: 2014-10-30 | Discharge: 2014-11-01 | DRG: 920 | Disposition: A | Discharge status: Home / Self Care | Payer: Medicare Other | Attending: Internal Medicine | Admitting: Internal Medicine

## 2014-10-30 ENCOUNTER — Encounter (HOSPITAL_COMMUNITY): Payer: Self-pay | Admitting: Nurse Practitioner

## 2014-10-30 DIAGNOSIS — I129 Hypertensive chronic kidney disease with stage 1 through stage 4 chronic kidney disease, or unspecified chronic kidney disease: Secondary | ICD-10-CM | POA: Diagnosis present

## 2014-10-30 DIAGNOSIS — Z9842 Cataract extraction status, left eye: Secondary | ICD-10-CM | POA: Diagnosis not present

## 2014-10-30 DIAGNOSIS — N183 Chronic kidney disease, stage 3 (moderate): Secondary | ICD-10-CM | POA: Diagnosis present

## 2014-10-30 DIAGNOSIS — Z79899 Other long term (current) drug therapy: Secondary | ICD-10-CM | POA: Diagnosis not present

## 2014-10-30 DIAGNOSIS — I10 Essential (primary) hypertension: Secondary | ICD-10-CM | POA: Diagnosis not present

## 2014-10-30 DIAGNOSIS — Z794 Long term (current) use of insulin: Secondary | ICD-10-CM

## 2014-10-30 DIAGNOSIS — N179 Acute kidney failure, unspecified: Secondary | ICD-10-CM | POA: Diagnosis present

## 2014-10-30 DIAGNOSIS — D649 Anemia, unspecified: Secondary | ICD-10-CM | POA: Diagnosis present

## 2014-10-30 DIAGNOSIS — E1159 Type 2 diabetes mellitus with other circulatory complications: Secondary | ICD-10-CM | POA: Diagnosis present

## 2014-10-30 DIAGNOSIS — Z9289 Personal history of other medical treatment: Secondary | ICD-10-CM

## 2014-10-30 DIAGNOSIS — E11319 Type 2 diabetes mellitus with unspecified diabetic retinopathy without macular edema: Secondary | ICD-10-CM | POA: Diagnosis present

## 2014-10-30 DIAGNOSIS — G4733 Obstructive sleep apnea (adult) (pediatric): Secondary | ICD-10-CM | POA: Diagnosis present

## 2014-10-30 DIAGNOSIS — E876 Hypokalemia: Secondary | ICD-10-CM | POA: Diagnosis present

## 2014-10-30 DIAGNOSIS — I152 Hypertension secondary to endocrine disorders: Secondary | ICD-10-CM | POA: Diagnosis present

## 2014-10-30 DIAGNOSIS — Z7902 Long term (current) use of antithrombotics/antiplatelets: Secondary | ICD-10-CM

## 2014-10-30 DIAGNOSIS — K91840 Postprocedural hemorrhage and hematoma of a digestive system organ or structure following a digestive system procedure: Secondary | ICD-10-CM | POA: Diagnosis present

## 2014-10-30 DIAGNOSIS — K921 Melena: Secondary | ICD-10-CM

## 2014-10-30 DIAGNOSIS — Z9841 Cataract extraction status, right eye: Secondary | ICD-10-CM

## 2014-10-30 DIAGNOSIS — Y848 Other medical procedures as the cause of abnormal reaction of the patient, or of later complication, without mention of misadventure at the time of the procedure: Secondary | ICD-10-CM | POA: Diagnosis present

## 2014-10-30 DIAGNOSIS — E119 Type 2 diabetes mellitus without complications: Secondary | ICD-10-CM

## 2014-10-30 DIAGNOSIS — D62 Acute posthemorrhagic anemia: Secondary | ICD-10-CM | POA: Diagnosis present

## 2014-10-30 DIAGNOSIS — I951 Orthostatic hypotension: Secondary | ICD-10-CM | POA: Diagnosis present

## 2014-10-30 DIAGNOSIS — F32A Depression, unspecified: Secondary | ICD-10-CM | POA: Diagnosis present

## 2014-10-30 DIAGNOSIS — M1990 Unspecified osteoarthritis, unspecified site: Secondary | ICD-10-CM | POA: Diagnosis present

## 2014-10-30 DIAGNOSIS — F329 Major depressive disorder, single episode, unspecified: Secondary | ICD-10-CM | POA: Diagnosis present

## 2014-10-30 DIAGNOSIS — R55 Syncope and collapse: Secondary | ICD-10-CM

## 2014-10-30 DIAGNOSIS — M109 Gout, unspecified: Secondary | ICD-10-CM | POA: Diagnosis present

## 2014-10-30 DIAGNOSIS — N1832 Chronic kidney disease, stage 3b: Secondary | ICD-10-CM | POA: Diagnosis present

## 2014-10-30 DIAGNOSIS — Z8673 Personal history of transient ischemic attack (TIA), and cerebral infarction without residual deficits: Secondary | ICD-10-CM | POA: Diagnosis not present

## 2014-10-30 DIAGNOSIS — E1142 Type 2 diabetes mellitus with diabetic polyneuropathy: Secondary | ICD-10-CM | POA: Diagnosis present

## 2014-10-30 DIAGNOSIS — K922 Gastrointestinal hemorrhage, unspecified: Secondary | ICD-10-CM | POA: Diagnosis present

## 2014-10-30 DIAGNOSIS — Z860100 Personal history of colon polyps, unspecified: Secondary | ICD-10-CM

## 2014-10-30 DIAGNOSIS — Z961 Presence of intraocular lens: Secondary | ICD-10-CM | POA: Diagnosis present

## 2014-10-30 HISTORY — DX: Dependence on other enabling machines and devices: Z99.89

## 2014-10-30 HISTORY — DX: Type 2 diabetes mellitus without complications: E11.9

## 2014-10-30 HISTORY — DX: Polyp of colon: K63.5

## 2014-10-30 HISTORY — DX: Unspecified osteoarthritis, unspecified site: M19.90

## 2014-10-30 HISTORY — DX: Headache: R51

## 2014-10-30 HISTORY — DX: Personal history of other diseases of the musculoskeletal system and connective tissue: Z87.39

## 2014-10-30 HISTORY — DX: Personal history of other medical treatment: Z92.89

## 2014-10-30 HISTORY — DX: Headache, unspecified: R51.9

## 2014-10-30 HISTORY — DX: Obstructive sleep apnea (adult) (pediatric): G47.33

## 2014-10-30 HISTORY — DX: Post-traumatic stress disorder, unspecified: F43.10

## 2014-10-30 HISTORY — DX: Anemia, unspecified: D64.9

## 2014-10-30 LAB — CBC WITH DIFFERENTIAL/PLATELET
BASOS ABS: 0 10*3/uL (ref 0.0–0.1)
BASOS PCT: 0 % (ref 0–1)
EOS ABS: 0.1 10*3/uL (ref 0.0–0.7)
Eosinophils Relative: 1 % (ref 0–5)
HEMATOCRIT: 23.6 % — AB (ref 39.0–52.0)
HEMOGLOBIN: 8.3 g/dL — AB (ref 13.0–17.0)
Lymphocytes Relative: 17 % (ref 12–46)
Lymphs Abs: 1.1 10*3/uL (ref 0.7–4.0)
MCH: 29.6 pg (ref 26.0–34.0)
MCHC: 35.2 g/dL (ref 30.0–36.0)
MCV: 84.3 fL (ref 78.0–100.0)
MONO ABS: 0.3 10*3/uL (ref 0.1–1.0)
MONOS PCT: 6 % (ref 3–12)
NEUTROS ABS: 4.6 10*3/uL (ref 1.7–7.7)
Neutrophils Relative %: 76 % (ref 43–77)
Platelets: 159 10*3/uL (ref 150–400)
RBC: 2.8 MIL/uL — ABNORMAL LOW (ref 4.22–5.81)
RDW: 13.4 % (ref 11.5–15.5)
WBC: 6 10*3/uL (ref 4.0–10.5)

## 2014-10-30 LAB — URINALYSIS, ROUTINE W REFLEX MICROSCOPIC
BILIRUBIN URINE: NEGATIVE
Glucose, UA: NEGATIVE mg/dL
Hgb urine dipstick: NEGATIVE
KETONES UR: NEGATIVE mg/dL
Leukocytes, UA: NEGATIVE
NITRITE: NEGATIVE
PROTEIN: 30 mg/dL — AB
Specific Gravity, Urine: 1.018 (ref 1.005–1.030)
UROBILINOGEN UA: 0.2 mg/dL (ref 0.0–1.0)
pH: 5 (ref 5.0–8.0)

## 2014-10-30 LAB — RETICULOCYTES
RBC.: 2.83 MIL/uL — ABNORMAL LOW (ref 4.22–5.81)
Retic Count, Absolute: 56.6 10*3/uL (ref 19.0–186.0)
Retic Ct Pct: 2 % (ref 0.4–3.1)

## 2014-10-30 LAB — COMPREHENSIVE METABOLIC PANEL
ALBUMIN: 2.7 g/dL — AB (ref 3.5–5.2)
ALK PHOS: 44 U/L (ref 39–117)
ALT: 17 U/L (ref 0–53)
AST: 18 U/L (ref 0–37)
Anion gap: 5 (ref 5–15)
BUN: 31 mg/dL — AB (ref 6–23)
CHLORIDE: 102 mmol/L (ref 96–112)
CO2: 29 mmol/L (ref 19–32)
Calcium: 8.2 mg/dL — ABNORMAL LOW (ref 8.4–10.5)
Creatinine, Ser: 1.95 mg/dL — ABNORMAL HIGH (ref 0.50–1.35)
GFR calc Af Amer: 39 mL/min — ABNORMAL LOW (ref >90)
GFR, EST NON AFRICAN AMERICAN: 34 mL/min — AB (ref >90)
GLUCOSE: 248 mg/dL — AB (ref 70–99)
POTASSIUM: 3.4 mmol/L — AB (ref 3.5–5.1)
Sodium: 136 mmol/L (ref 135–145)
Total Bilirubin: 0.6 mg/dL (ref 0.3–1.2)
Total Protein: 5 g/dL — ABNORMAL LOW (ref 6.0–8.3)

## 2014-10-30 LAB — GLUCOSE, CAPILLARY: GLUCOSE-CAPILLARY: 220 mg/dL — AB (ref 70–99)

## 2014-10-30 LAB — CBG MONITORING, ED: Glucose-Capillary: 186 mg/dL — ABNORMAL HIGH (ref 70–99)

## 2014-10-30 LAB — TROPONIN I: Troponin I: 0.03 ng/mL (ref <0.031)

## 2014-10-30 LAB — CBC
HCT: 25.7 % — ABNORMAL LOW (ref 39.0–52.0)
HEMOGLOBIN: 9 g/dL — AB (ref 13.0–17.0)
MCH: 30.4 pg (ref 26.0–34.0)
MCHC: 35 g/dL (ref 30.0–36.0)
MCV: 86.8 fL (ref 78.0–100.0)
PLATELETS: 143 10*3/uL — AB (ref 150–400)
RBC: 2.96 MIL/uL — ABNORMAL LOW (ref 4.22–5.81)
RDW: 14.2 % (ref 11.5–15.5)
WBC: 5.1 10*3/uL (ref 4.0–10.5)

## 2014-10-30 LAB — URINE MICROSCOPIC-ADD ON

## 2014-10-30 LAB — PROTIME-INR
INR: 1.22 (ref 0.00–1.49)
Prothrombin Time: 15.5 seconds — ABNORMAL HIGH (ref 11.6–15.2)

## 2014-10-30 LAB — PREPARE RBC (CROSSMATCH)

## 2014-10-30 LAB — APTT: APTT: 28 s (ref 24–37)

## 2014-10-30 LAB — ABO/RH: ABO/RH(D): O POS

## 2014-10-30 LAB — POC OCCULT BLOOD, ED: Fecal Occult Bld: POSITIVE — AB

## 2014-10-30 MED ORDER — ONDANSETRON HCL 4 MG/2ML IJ SOLN
4.0000 mg | Freq: Four times a day (QID) | INTRAMUSCULAR | Status: DC | PRN
  Administered 2014-10-30: 4 mg via INTRAVENOUS
  Filled 2014-10-30: qty 2

## 2014-10-30 MED ORDER — ALBUTEROL SULFATE (2.5 MG/3ML) 0.083% IN NEBU: 3.0000 mL | INHALATION_SOLUTION | Freq: Four times a day (QID) | RESPIRATORY_TRACT | Status: DC | PRN

## 2014-10-30 MED ORDER — CITALOPRAM HYDROBROMIDE 20 MG PO TABS
20.0000 mg | ORAL_TABLET | Freq: Every day | ORAL | Status: DC
  Administered 2014-10-30 – 2014-11-01 (×3): 20 mg via ORAL
  Filled 2014-10-30 (×3): qty 1

## 2014-10-30 MED ORDER — INSULIN ASPART 100 UNIT/ML ~~LOC~~ SOLN
0.0000 [IU] | Freq: Three times a day (TID) | SUBCUTANEOUS | Status: DC
  Administered 2014-10-31 – 2014-11-01 (×5): 3 [IU] via SUBCUTANEOUS

## 2014-10-30 MED ORDER — PEG 3350-KCL-NA BICARB-NACL 420 G PO SOLR
4000.0000 mL | Freq: Once | ORAL | Status: AC
  Administered 2014-10-30: 4000 mL via ORAL
  Filled 2014-10-30: qty 4000

## 2014-10-30 MED ORDER — POTASSIUM CHLORIDE 10 MEQ/100ML IV SOLN
10.0000 meq | INTRAVENOUS | Status: AC
  Administered 2014-10-30: 10 meq via INTRAVENOUS
  Filled 2014-10-30 (×2): qty 100

## 2014-10-30 MED ORDER — POTASSIUM CHLORIDE 10 MEQ/100ML IV SOLN
10.0000 meq | Freq: Once | INTRAVENOUS | Status: AC
  Administered 2014-10-30: 10 meq via INTRAVENOUS
  Filled 2014-10-30: qty 100

## 2014-10-30 MED ORDER — SODIUM CHLORIDE 0.9 % IV SOLN: INTRAVENOUS | Status: DC

## 2014-10-30 MED ORDER — SODIUM CHLORIDE 0.9 % IV BOLUS (SEPSIS)
1000.0000 mL | Freq: Once | INTRAVENOUS | Status: AC
  Administered 2014-10-30: 1000 mL via INTRAVENOUS

## 2014-10-30 MED ORDER — SODIUM CHLORIDE 0.9 % IJ SOLN
3.0000 mL | Freq: Two times a day (BID) | INTRAMUSCULAR | Status: DC
  Administered 2014-10-30 – 2014-11-01 (×4): 3 mL via INTRAVENOUS

## 2014-10-30 MED ORDER — SODIUM CHLORIDE 0.9 % IV SOLN
INTRAVENOUS | Status: DC
  Administered 2014-10-30: 17:00:00 via INTRAVENOUS

## 2014-10-30 MED ORDER — SIMVASTATIN 40 MG PO TABS
40.0000 mg | ORAL_TABLET | Freq: Every evening | ORAL | Status: DC
  Administered 2014-10-30 – 2014-10-31 (×2): 40 mg via ORAL
  Filled 2014-10-30 (×3): qty 1

## 2014-10-30 MED ORDER — SODIUM CHLORIDE 0.9 % IV SOLN: 10.0000 mL/h | Freq: Once | INTRAVENOUS | Status: DC

## 2014-10-30 MED ORDER — ONDANSETRON HCL 4 MG PO TABS: 4.0000 mg | ORAL_TABLET | Freq: Four times a day (QID) | ORAL | Status: DC | PRN

## 2014-10-30 NOTE — Consult Note (Signed)
[facility] Consultation Note  Referring Provider: [facility] Primary Care Physician:  Pcp Not In System  Reason for Consultation:  hematochezia  HPI: Cody Silva is a 69 y.o. male whom we've been asked to see for hematochezia.  Patient has history of stroke and takes clopidigrel.  He had colonoscopy 9 days ago, prior to which he reports holding clopidigrel for a few days.  During the procedure, which was done at [facility] (we don't have the records), he apparently had 7 large polyps removed; wife recalls patient having some bleeding immediately post-polypectomy and describes what sounds like placement of hemoclips post polypectomy.  Patient had no obvious recurrent bleeding post-procedure until this morning.  While sitting on toilet this morning, patient became acutely diaphoretic and passed out; in toilet there was a large amount of blood.  No further bleeding since then.  Patient denies hematemesis, abdominal pain, fevers.  He restarted his clopidigrel post-colonoscopy a couple days ago.   Past Medical History  Diagnosis Date  . Hypertension   . Diabetes mellitus without complication   . Diabetic retinopathy   . Diabetic neuropathy   . Stroke     left sided deficits  . Colon polyps     Past Surgical History  Procedure Laterality Date  . Eye surgery    . Arm surgery      Tumor removed     Prior to Admission medications   Medication Sig Start Date End Date Taking? Authorizing Provider  albuterol (PROVENTIL HFA;VENTOLIN HFA) 108 (90 BASE) MCG/ACT inhaler Inhale 2 puffs into the lungs every 6 (six) hours as needed for wheezing.   Yes Historical Provider, MD  Aspirin-Salicylamide-Caffeine (ARTHRITIS STRENGTH BC POWDER PO) Take 1 packet by mouth as needed (headaches).   Yes Historical Provider, MD  carvedilol (COREG) 25 MG tablet Take 25 mg by mouth 2 (two) times daily with a meal.   Yes Historical Provider, MD  gabapentin (NEURONTIN) 300 MG capsule Take 300  mg by mouth daily as needed (leg pain).    Yes Historical Provider, MD  insulin glargine (LANTUS) 100 UNIT/ML injection Inject 30-49 Units into the skin at bedtime.    Yes Historical Provider, MD  losartan (COZAAR) 100 MG tablet Take 100 mg by mouth daily.   Yes Historical Provider, MD  Multiple Vitamins-Minerals (MULTIVITAMIN PO) Take 1 tablet by mouth daily.   Yes Historical Provider, MD  polyvinyl alcohol (LIQUIFILM TEARS) 1.4 % ophthalmic solution Place 1 drop into both eyes 5 (five) times daily as needed (for dry eyes).   Yes Historical Provider, MD  simvastatin (ZOCOR) 40 MG tablet Take 40 mg by mouth every evening.   Yes Historical Provider, MD  citalopram (CELEXA) 20 MG tablet Take 20 mg by mouth daily.    Historical Provider, MD  cloNIDine (CATAPRES - DOSED IN MG/24 HR) 0.3 mg/24hr Place 1 patch onto the skin once a week.    Historical Provider, MD  clopidogrel (PLAVIX) 75 MG tablet Take 75 mg by mouth at bedtime.     Historical Provider, MD  fluocinonide cream (LIDEX) [id number] % Apply 1 application topically 2 (two) times daily.    Historical Provider, MD  fluticasone (FLONASE) 50 MCG/ACT nasal spray Place 2 sprays into the nose daily.    Historical Provider, MD  insulin aspart (NOVOLOG) 100 UNIT/ML injection Inject 5-15 Units into the skin 3 (three) times daily with meals.     Historical Provider, MD  latanoprost (XALATAN) 0.005 % ophthalmic solution Place 1 drop into both  eyes at bedtime.     Historical Provider, MD  Skin Protectants, Misc. (EUCERIN) cream Apply 1 application topically daily.     Historical Provider, MD  spironolactone (ALDACTONE) 25 MG tablet Take 25 mg by mouth daily.    Historical Provider, MD    Current Facility-Administered Medications  Medication Dose Route Frequency Provider Last Rate Last Dose  . 0.9 %  sodium chloride infusion  10 mL/hr Intravenous Once Hyman Bible, PA-C   Stopped at 10/30/14 1206    Allergies as of 10/30/2014 - Review Complete 10/30/2014   Allergen Reaction Noted  . Metformin and related Diarrhea 06/12/2013    Family History  Problem Relation Age of Onset  . Diabetes Mother   . Breast cancer Mother   . Stroke Brother   . Neurofibromatosis Maternal Uncle     History   Social History  . Marital Status: Married    Spouse Name: sheila  . Number of Children: 4  . Years of Education: 16   Occupational History  . Not on file.   Social History Main Topics  . Smoking status: Never Smoker   . Smokeless tobacco: Never Used  . Alcohol Use: 0.0 oz/week    0 Standard drinks or equivalent per week     Comment: rare  . Drug Use: No  . Sexual Activity: Yes   Other Topics Concern  . Not on file   Social History Narrative   Patient is married with 4 children   Patient is right handed   Patient has a Water quality scientist degree    Patient 4 cups daily    Review of Systems: Positive = bold Gen: Denies any fever, chills, rigors, night sweats, anorexia, fatigue, weakness, malaise, involuntary weight loss, and sleep disorder CV: Denies chest pain, angina, palpitations, syncope, orthopnea, PND, peripheral edema, and claudication. Resp: Denies dyspnea, cough, sputum, wheezing, coughing up blood. GI: Described in detail in HPI.    GU : Denies urinary burning, blood in urine, urinary frequency, urinary hesitancy, nocturnal urination, and urinary incontinence. MS: Denies joint pain or swelling.  Denies muscle weakness, cramps, atrophy.  Derm: Denies rash, itching, oral ulcerations, hives, unhealing ulcers.  Psych: Denies depression, anxiety, memory loss, suicidal ideation, hallucinations,  and confusion. Heme: Denies bruising, bleeding, and enlarged lymph nodes. Neuro:  Denies any headaches, dizziness, paresthesias. Endo:  Denies any problems with DM, thyroid, adrenal function.  Physical Exam: Vital signs in last 24 hours: Temp:  [98 F (36.7 C)] 98 F (36.7 C) (02/26 1004) Pulse Rate:  [72-80] 79 (02/26 1300) Resp:  [12-24] 14  (02/26 1300) BP: (127-155)/(55-80) 128/70 mmHg (02/26 1300) SpO2:  [99 %-100 %] 100 % (02/26 1300) Weight:  [106.595 kg (235 lb)] 106.595 kg (235 lb) (02/26 1001)   General:   Alert,  Well-developed, well-nourished, pleasant and cooperative in NAD Head:  Normocephalic and atraumatic. Eyes:  Sclera clear, no icterus.   Conjunctiva somewhat pale Ears:  Normal auditory acuity. Nose:  No deformity, discharge,  or lesions. Mouth:  No deformity or lesions.  Oropharynx pale and somewhat dry Neck:  Supple; no masses or thyromegaly. Lungs:  Clear throughout to auscultation.   No wheezes, crackles, or rhonchi. No acute distress. Heart:  Regular rate and rhythm; no murmurs, clicks, rubs,  or gallops. Abdomen:  Soft, nontender and nondistended. No masses, hepatosplenomegaly or hernias noted. Normal bowel sounds, without guarding, and without rebound.     Msk:  Symmetrical without gross deformities. Normal posture. Pulses:  Normal pulses noted. Extremities:  Without clubbing or edema. Neurologic:  Alert and  oriented x4; left hemiparesis; some expressive aphasia Skin:  Intact without significant lesions or rashes. Cervical Nodes:  No significant cervical adenopathy. Psych:  Alert and cooperative. Normal mood and affect.   Lab Results:  Recent Labs  10/30/14 1011  WBC 6.0  HGB 8.3*  HCT 23.6*  PLT 159   BMET  Recent Labs  10/30/14 1049  NA 136  K 3.4*  CL 102  CO2 29  GLUCOSE 248*  BUN 31*  CREATININE 1.95*  CALCIUM 8.2*   LFT  Recent Labs  10/30/14 1049  PROT 5.0*  ALBUMIN 2.7*  AST 18  ALT 17  ALKPHOS 44  BILITOT 0.6   PT/INR  Recent Labs  10/30/14 1159  LABPROT 15.5*  INR 1.22    Studies/Results: No results found.  Impression:  1.  Hematochezia.  Suspect post-polypectomy bleeding, exacerbated by antiplatelet therapy (clopidigrel). 2.  Acute blood loss anemia. 3.  Personal history of colon polyps.  Plan:  1.  Admission to hospitalist service  planned. 2.  Clear liquid diet. 3.  Stay off Plavix and anticoagulants, for the time-being. 4.  Volume repletion, serial CBCs, transfuse as needed. 5.  Start slow bowel preparation.  If bleeding clears, would hold off on colonoscopy; if bleeding persists, would plan on colonoscopy tomorrow. 6.  Will follow.   LOS: 0 days   Jourdin Gens M  10/30/2014, 1:53 PM

## 2014-10-30 NOTE — ED Notes (Signed)
Left number with floor- [address]

## 2014-10-30 NOTE — ED Provider Notes (Signed)
CSN: [id number]     Arrival date & time 10/30/14  [id number] History   First MD Initiated Contact with Patient 10/30/14 1001     Chief Complaint  Patient presents with  . Near Syncope     (Consider location/radiation/quality/duration/timing/severity/associated sxs/prior Treatment) HPI Comments: Patient with a history of DM and CVA  presents today after a syncopal episode.  He reports that this morning when he woke up he felt dizzy and generalized weakness.  He then went to the bathroom.  While on the toilet he passed out.  His wife walked into the bathroom to find him slumped over on the toilet and not responsive.  His wife reports that LOC lasted for approximately 2-3 minutes.  Wife notes that the patient had some diarrhea while on the toilet and she noticed dark red blood in the toilet.  Patient reports that he did become diaphoretic and had blurred vision prior to the syncopal episode.  Wife denies noticing any slurred speech or facial droop.  Patient denies chest pain or SOB.  He denies abdominal pain, nausea, vomiting, focal weakness, numbness, or tingling.  He did have a Colonoscopy 9 days ago at [facility].  He reports that he had numerous Polyps removed at that time.  He reports that he had some rectal bleeding and a near syncope episode 9 days ago after the procedure, but has not noticed any rectal bleeding since that time aside from today.  Patient had been on Plavix, but states that he stopped taking the Plavix prior to the Colonoscopy.   He is unsure of the exact date of his last dose of Plavix, but believes that he has taken one episode since the procedure.  Patient is a 69 y.o. male presenting with near-syncope. The history is provided by the patient.  Near Syncope    Past Medical History  Diagnosis Date  . Hypertension   . Diabetes mellitus without complication   . Diabetic retinopathy   . Diabetic neuropathy   . Stroke     left sided deficits  . Colon polyps    Past Surgical  History  Procedure Laterality Date  . Eye surgery    . Arm surgery      Tumor removed    Family History  Problem Relation Age of Onset  . Diabetes Mother   . Breast cancer Mother   . Stroke Brother   . Neurofibromatosis Maternal Uncle    History  Substance Use Topics  . Smoking status: Never Smoker   . Smokeless tobacco: Never Used  . Alcohol Use: 0.0 oz/week    0 Standard drinks or equivalent per week     Comment: rare    Review of Systems  Cardiovascular: Positive for near-syncope.  All other systems reviewed and are negative.     Allergies  Metformin and related  Home Medications   Prior to Admission medications   Medication Sig Start Date End Date Taking? Authorizing Provider  albuterol (PROVENTIL HFA;VENTOLIN HFA) 108 (90 BASE) MCG/ACT inhaler Inhale 2 puffs into the lungs every 6 (six) hours as needed for wheezing.    Historical Provider, MD  carvedilol (COREG) 25 MG tablet Take 25 mg by mouth 2 (two) times daily with a meal.    Historical Provider, MD  citalopram (CELEXA) 20 MG tablet Take 20 mg by mouth daily.    Historical Provider, MD  cloNIDine (CATAPRES - DOSED IN MG/24 HR) 0.3 mg/24hr Place 1 patch onto the skin once a week.  Historical Provider, MD  clopidogrel (PLAVIX) 75 MG tablet Take 75 mg by mouth at bedtime.     Historical Provider, MD  fluocinonide cream (LIDEX) [id number] % Apply 1 application topically 2 (two) times daily.    Historical Provider, MD  fluticasone (FLONASE) 50 MCG/ACT nasal spray Place 2 sprays into the nose daily.    Historical Provider, MD  gabapentin (NEURONTIN) 300 MG capsule Take 300 mg by mouth daily as needed (leg pain).     Historical Provider, MD  insulin aspart (NOVOLOG) 100 UNIT/ML injection Inject 10 Units into the skin 3 (three) times daily with meals.    Historical Provider, MD  insulin glargine (LANTUS) 100 UNIT/ML injection Inject 50 Units into the skin at bedtime.    Historical Provider, MD  latanoprost (XALATAN) 0.005 %  ophthalmic solution Place 1 drop into the left eye at bedtime.    Historical Provider, MD  losartan (COZAAR) 100 MG tablet Take 100 mg by mouth daily.    Historical Provider, MD  Multiple Vitamins-Minerals (MULTIVITAMIN PO) Take 1 tablet by mouth daily.    Historical Provider, MD  polyvinyl alcohol (LIQUIFILM TEARS) 1.4 % ophthalmic solution Place 1 drop into both eyes 5 (five) times daily as needed (for dry eyes).    Historical Provider, MD  simvastatin (ZOCOR) 40 MG tablet Take 40 mg by mouth every evening.    Historical Provider, MD  Skin Protectants, Misc. (EUCERIN) cream Apply 1 application topically as needed (for dry feet).    Historical Provider, MD  spironolactone (ALDACTONE) 25 MG tablet Take 25 mg by mouth daily.    Historical Provider, MD   BP 144/55 mmHg  Pulse 76  Temp(Src) 98 F (36.7 C) (Oral)  Resp 24  Ht 6' 4.5" (1.943 m)  Wt 235 lb (106.595 kg)  BMI 28.24 kg/m2  SpO2 100% Physical Exam  Constitutional: He appears well-developed and well-nourished.  HENT:  Head: Normocephalic and atraumatic.  Mouth/Throat: Oropharynx is clear and moist.  Eyes: EOM are normal. Pupils are equal, round, and reactive to light.  Neck: Normal range of motion. Neck supple.  Cardiovascular: Normal rate, regular rhythm and normal heart sounds.   Pulmonary/Chest: Effort normal and breath sounds normal.  Abdominal: Soft. Bowel sounds are normal. He exhibits no distension and no mass. There is no tenderness. There is no rebound and no guarding.  Genitourinary: Guaiac positive stool.  Dark red blood with rectal exam  Musculoskeletal: Normal range of motion.  Neurological: He is alert. He has normal strength. No cranial nerve deficit or sensory deficit. Coordination and gait normal.  Skin: Skin is warm and dry.  Psychiatric: He has a normal mood and affect.  Nursing note and vitals reviewed.   ED Course  Procedures (including critical care time) Labs Review Labs Reviewed  CBC WITH  DIFFERENTIAL/PLATELET - Abnormal; Notable for the following:    RBC 2.80 (*)    Hemoglobin 8.3 (*)    HCT 23.6 (*)    All other components within normal limits  COMPREHENSIVE METABOLIC PANEL - Abnormal; Notable for the following:    Potassium 3.4 (*)    Glucose, Bld 248 (*)    BUN 31 (*)    Creatinine, Ser 1.95 (*)    Calcium 8.2 (*)    Total Protein 5.0 (*)    Albumin 2.7 (*)    GFR calc non Af Amer 34 (*)    GFR calc Af Amer 39 (*)    All other components within normal limits  CBG  MONITORING, ED - Abnormal; Notable for the following:    Glucose-Capillary 186 (*)    All other components within normal limits  OCCULT BLOOD X 1 CARD TO LAB, STOOL  TYPE AND SCREEN  PREPARE RBC (CROSSMATCH)    Imaging Review No results found.   EKG Interpretation   Date/Time:  Friday October 30 2014 10:00:59 EST Ventricular Rate:  75 PR Interval:  162 QRS Duration: 104 QT Interval:  416 QTC Calculation: 465 R Axis:   32 Text Interpretation:  Sinus rhythm Borderline repolarization abnormality  No significant change since last tracing Confirmed by BEATON  MD, ROBERT  ([id number]) on 10/30/2014 10:10:05 AM     11:57 AM Discussed with Dr. Paulita Fujita with GI.  He recommends admission to medicine and states that he will be available for consult. 12:30 PM Discussed with Internal Medicine Teaching Service who agreed to admit the patient.   MDM   Final diagnoses:  None   Patient presents today with GI bleed and syncope. He was seen by GI at the [facility] nine days ago and had polyps removed from his colon at that time.  He reports that today he noticed dark red blood in his stool.  In the ED, he is found to be orthostatic.  He is also found to have a Hemoglobin of 8.3, which is down from his baseline of 12-13.  Hemoccult is positive.  Patient given 2 Units PRBC.   GI consulted and recommended medicine admit.  Patient admitted to Internal Medicine Teaching Service.     Hyman Bible, PA-C 11/01/14  2156  Dot Lanes, MD 11/08/14 [phone]

## 2014-10-30 NOTE — ED Notes (Signed)
Per EMS pt from home was found by wife on toilet with near syncopal episode- pt was dizzy and minimally responsive. Patient recently went for colonoscopy last week and removed several polyps. Today when pt was having BM it was watery with bright red blood. Pt in NAD at present time alert and oriented x4

## 2014-10-30 NOTE — ED Notes (Signed)
Patient CBG was 186.

## 2014-10-30 NOTE — H&P (Signed)
Date: 10/30/2014               Patient Name:  Cody Silva MRN: [id number]  DOB: Dec 28, 1945 Age / Sex: 69 y.o., male   PCP: Pcp Not In System         Medical Service: Internal Medicine Teaching Service         Attending Physician: Dr. Karren Cobble, MD    First Contact: Dr. Albin Felling Pager: [phone]  Second Contact: Dr. 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 Patient reports last night he had some diarrhea and then this morning he went to the bathroom when he suddenly became dizzy, diaphoretic, and had to lean himself against the wall. His wife states she heard him get up to go to the bathroom and when he didn't respond to she went to the bathroom to check on him and found him passed out against the wall. She states he had dark red blood coming from his rectum. Patient states he had dark red blood in his stool. He also notes he had a colonoscopy last Wednesday with seven "large" polyps removed. His wife states that same day his blood pressure had dropped to the [id number] systolic and that he had a near syncopal event. He reports some nausea and vomiting, but denies chest pain, palpitations, dyspnea, abdominal pain, and vertigo. He is on Plavix for stroke that occurred 2 years ago.[name] He denies excessive NSAID use.   In the ED, patient's Hbg found to be acutely low at 8.3, down from his baseline 12-13. FOBT positive. He was given 1 unit PRBCs.   Home Meds: Albuterol 2 puffs Q6H PRN wheezing Aspirin-salicylamide-caffeine 1 packet PO PRN headaches Coreg 25 mg BID Celexa 20 mg daily Clonidine patch 0.3mg /24 hr Qweekly Plavix 75 mg  QHS Flonase daily Gabapentin 300 mg daily PRN leg pain Novolog 5-15 units TID with meals Lantus 30-49 units QHS Losartan 100 mg daily Multivitamin daily Simvastatin 40 mg every evening Eucerin cream daily Spironolactone 25 mg daily   Allergies: Allergies as of 10/30/2014 - Review Complete 10/30/2014  Allergen Reaction Noted  . Metformin and related Diarrhea 06/12/2013   Past Medical History  Diagnosis Date  . Hypertension   . Diabetic retinopathy   . Diabetic neuropathy   . Colon polyps   . History of blood transfusion 10/30/2014    hematochezia  . Anemia   . OSA on CPAP     "suppose to wear mask; I've got a call in for an equipment change" (10/30/2014)  . Type II diabetes mellitus   . Headache     "@ least 3 times/wk" (10/30/2014)  . Stroke 2014    left extremity deficits; facial left  . Arthritis     "right arm, right ankle, right side" (10/30/2014)  . History of gout   . PTSD (post-traumatic stress disorder)     "service related"   Past Surgical History  Procedure Laterality Date  . Cataract extraction w/ intraocular lens  implant, bilateral Bilateral 2014-2015  . Tumor removal Right ~ 1976    "arm; had to take bone left hip  to add to the repair"  . Bone graft hip iliac crest Left ~ 1976   Family History  Problem Relation Age of Onset  . Diabetes Mother   . Breast cancer Mother   . Stroke Brother   . Neurofibromatosis Maternal Uncle    History   Social History  . Marital Status: Married    Spouse Name: sheila  . Number of Children: 4  . Years of Education: 16   Occupational History  . Not on file.   Social History Main Topics  . Smoking status: Never Smoker   . Smokeless tobacco: Never Used  . Alcohol Use: 0.0 oz/week    0 Standard drinks or equivalent per week     Comment: 10/30/2014 "might have a beer a couple times/yr"  . Drug Use: No  . Sexual Activity: Yes   Other Topics Concern  . Not on file   Social History Narrative   Patient is  married with 4 children   Patient is right handed   Patient has a Water quality scientist degree    Patient 4 cups daily    Review of Systems: General: Denies fever, chills, night sweats, changes in weight, changes in appetite HEENT: Denies headaches, ear pain, rhinorrhea, sore throat CV: See HPI Pulm: Denies cough, wheezing GI: See HPI GU: Denies dysuria, hematuria, frequency Msk: Denies muscle cramps, joint pains Neuro: Denies numbness, tingling Skin: Denies rashes, bruising  Physical Exam: Blood pressure 164/79, pulse 86, temperature 98.3 F (36.8 C), temperature source Oral, resp. rate 16, height 6\' 4"  (1.93 m), weight 243 lb 6.4 oz (110.406 kg), SpO2 100 %. General: alert, sitting up in bed, NAD HEENT: [place]/AT, EOMI, conjunctiva normal, mucus membranes moist CV: RRR, no m/g/r Pulm: CTA bilaterally, breaths non-labored Abd: BS+, soft, non-tender, non-distended  Ext: warm, 1+ pitting edema bilaterally Neuro: alert and oriented x 3, no focal deficits  Lab results: Basic Metabolic Panel:  Recent Labs  10/30/14 1049  NA 136  K 3.4*  CL 102  CO2 29  GLUCOSE 248*  BUN 31*  CREATININE 1.95*  CALCIUM 8.2*   Liver Function Tests:  Recent Labs  10/30/14 1049  AST 18  ALT 17  ALKPHOS 44  BILITOT 0.6  PROT 5.0*  ALBUMIN 2.7*   CBC:  Recent Labs  10/30/14 1011  WBC 6.0  NEUTROABS 4.6  HGB 8.3*  HCT 23.6*  MCV 84.3  PLT 159   CBG:  Recent Labs  10/30/14 1016  GLUCAP 186*   Anemia Panel:  Recent Labs  10/30/14 1011  RETICCTPCT 2.0   Coagulation:  Recent Labs  10/30/14 1159  LABPROT 15.5*  INR 1.22   Other results: EKG: sinus rhythm, prolonged QTc, no significant changes from prior EKG  Assessment & Plan by Problem:  Acute Lower GI Bleeding: Patient presented with 1 day history of hematochezia and dizziness in the setting of recent coloscopy with polyp removal and antiplatelet therapy (plavix) found to have an acute anemia. His GI bleeding is likely  delayed bleeding from his polypectomy last week. Hbg 8.3 on admission, below baseline of 12-13. FOBT positive. BUN elevated at 31. He received 1 unit PRBCs. GI was consulted and will monitor bleeding for now, and consider colonoscopy in AM if bleeding persists.  - GI consulted, appreciate recommendations. Possible colonoscopy in AM if bleeding persists. - Clears for now - Repeat CBC at 7 PM - IVF @ 125  Symptomatic Anemia: Patient presented with dizziness and syncope at home found to have Hbg  8.3, down from baseline 12-13. He was given 1 unit PRBCs. Patient no longer symptomatic. Anemia likely related to acute lower GI bleeding as stated above.  - f/u post-transfusion CBC (7 PM) - IVF @ 125  AKI on ?CKD Stage 3: Cr 1.95 on admission, baseline 1.2? Unclear baseline as Cr ranges between 1.2-1.5 since 2014. Likely elevated in setting of recent GI bleed (volume loss). Will replete with IVFs and 1 unit PRBCs. - Repeat bmet in AM - IVF @ 125  Hypokalemia: K 3.4 on admission. Potassium was repleted.  - Repeat bmet in AM  HTN: BP 164/79 on admission. Patient is on Losartan 100 mg daily, Clonidine 0.3 mg/24 hr patch, and Spironolactone 25 mg daily at home.  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 Patient takes Lantus 30-49 units QHS and Novolog 5-15 units TID with meals. He also takes Gabapentin 300 mg daily PRN for his peripheral neuropathy. - Hold home Lantus - Start moderate ISS - CBGs 4 times daily  Hx CVA: Patient with history of CVA 2 years ago. He has been on Plavix ever since.  - Hold Plavix given acute GI bleeding   Diet: Clear liquids  VTE PPx: SCDs Dispo: Disposition is deferred at this time, awaiting improvement of current medical problems. Anticipated discharge in approximately 1-2 day(s).   The patient does not have a current PCP (Pcp Not In System) and does need an [facility] hospital follow-up  appointment after discharge.  The patient does not have transportation limitations that hinder transportation to clinic appointments.  Signed: Albin Felling, MD 10/30/2014, 4:34 PM

## 2014-10-30 NOTE — ED Notes (Signed)
GI at bedside

## 2014-10-31 DIAGNOSIS — I129 Hypertensive chronic kidney disease with stage 1 through stage 4 chronic kidney disease, or unspecified chronic kidney disease: Secondary | ICD-10-CM

## 2014-10-31 DIAGNOSIS — E11319 Type 2 diabetes mellitus with unspecified diabetic retinopathy without macular edema: Secondary | ICD-10-CM

## 2014-10-31 DIAGNOSIS — E1140 Type 2 diabetes mellitus with diabetic neuropathy, unspecified: Secondary | ICD-10-CM

## 2014-10-31 DIAGNOSIS — Z8673 Personal history of transient ischemic attack (TIA), and cerebral infarction without residual deficits: Secondary | ICD-10-CM

## 2014-10-31 DIAGNOSIS — D649 Anemia, unspecified: Secondary | ICD-10-CM

## 2014-10-31 DIAGNOSIS — N183 Chronic kidney disease, stage 3 (moderate): Secondary | ICD-10-CM

## 2014-10-31 DIAGNOSIS — E876 Hypokalemia: Secondary | ICD-10-CM

## 2014-10-31 LAB — BASIC METABOLIC PANEL
Anion gap: 6 (ref 5–15)
BUN: 16 mg/dL (ref 6–23)
CHLORIDE: 103 mmol/L (ref 96–112)
CO2: 30 mmol/L (ref 19–32)
Calcium: 8.3 mg/dL — ABNORMAL LOW (ref 8.4–10.5)
Creatinine, Ser: 1.38 mg/dL — ABNORMAL HIGH (ref 0.50–1.35)
GFR calc Af Amer: 60 mL/min — ABNORMAL LOW (ref >90)
GFR, EST NON AFRICAN AMERICAN: 51 mL/min — AB (ref >90)
Glucose, Bld: 177 mg/dL — ABNORMAL HIGH (ref 70–99)
Potassium: 3.2 mmol/L — ABNORMAL LOW (ref 3.5–5.1)
Sodium: 139 mmol/L (ref 135–145)

## 2014-10-31 LAB — CBC
HEMATOCRIT: 24.6 % — AB (ref 39.0–52.0)
Hemoglobin: 8.5 g/dL — ABNORMAL LOW (ref 13.0–17.0)
MCH: 29.8 pg (ref 26.0–34.0)
MCHC: 34.6 g/dL (ref 30.0–36.0)
MCV: 86.3 fL (ref 78.0–100.0)
PLATELETS: 140 10*3/uL — AB (ref 150–400)
RBC: 2.85 MIL/uL — ABNORMAL LOW (ref 4.22–5.81)
RDW: 14 % (ref 11.5–15.5)
WBC: 5.5 10*3/uL (ref 4.0–10.5)

## 2014-10-31 LAB — GLUCOSE, CAPILLARY
GLUCOSE-CAPILLARY: 189 mg/dL — AB (ref 70–99)
Glucose-Capillary: 179 mg/dL — ABNORMAL HIGH (ref 70–99)
Glucose-Capillary: 189 mg/dL — ABNORMAL HIGH (ref 70–99)
Glucose-Capillary: 230 mg/dL — ABNORMAL HIGH (ref 70–99)

## 2014-10-31 LAB — MAGNESIUM: Magnesium: 1.6 mg/dL (ref 1.5–2.5)

## 2014-10-31 LAB — TROPONIN I: Troponin I: 0.03 ng/mL (ref <0.031)

## 2014-10-31 MED ORDER — LATANOPROST 0.005 % OP SOLN
1.0000 [drp] | Freq: Every day | OPHTHALMIC | Status: DC
  Administered 2014-10-31: 1 [drp] via OPHTHALMIC
  Filled 2014-10-31: qty 2.5

## 2014-10-31 MED ORDER — GABAPENTIN 600 MG PO TABS
300.0000 mg | ORAL_TABLET | Freq: Every day | ORAL | Status: DC | PRN
  Filled 2014-10-31: qty 0.5

## 2014-10-31 MED ORDER — CLONIDINE HCL 0.3 MG/24HR TD PTWK
0.3000 mg | MEDICATED_PATCH | TRANSDERMAL | Status: DC
  Administered 2014-10-31: 0.3 mg via TRANSDERMAL
  Filled 2014-10-31: qty 1

## 2014-10-31 MED ORDER — CARVEDILOL 25 MG PO TABS
25.0000 mg | ORAL_TABLET | Freq: Two times a day (BID) | ORAL | Status: DC
  Administered 2014-10-31 – 2014-11-01 (×2): 25 mg via ORAL
  Filled 2014-10-31 (×4): qty 1

## 2014-10-31 MED ORDER — POTASSIUM CHLORIDE CRYS ER 20 MEQ PO TBCR
40.0000 meq | EXTENDED_RELEASE_TABLET | Freq: Once | ORAL | Status: AC
  Administered 2014-10-31: 40 meq via ORAL
  Filled 2014-10-31: qty 2

## 2014-10-31 MED ORDER — GABAPENTIN 300 MG PO CAPS
300.0000 mg | ORAL_CAPSULE | Freq: Every day | ORAL | Status: DC | PRN
  Administered 2014-10-31: 300 mg via ORAL
  Filled 2014-10-31 (×2): qty 1

## 2014-10-31 MED ORDER — MAGNESIUM SULFATE 2 GM/50ML IV SOLN
2.0000 g | Freq: Once | INTRAVENOUS | Status: AC
  Administered 2014-10-31: 2 g via INTRAVENOUS
  Filled 2014-10-31: qty 50

## 2014-10-31 NOTE — Progress Notes (Signed)
Subjective: Patient reports he had a bowel movement this morning with no bleeding. He denies any further frank blood. He denies abdominal pain, nausea, vomiting. Awaiting GI to see if scoping today.   Objective: Vital signs in last 24 hours: Filed Vitals:   10/30/14 1708 10/30/14 2151 10/31/14 0523 10/31/14 0945  BP: 149/72 173/95 164/73 196/76  Pulse: 79 84 87 87  Temp: 98.3 F (36.8 C) 98.2 F (36.8 C) 97.9 F (36.6 C) 98 F (36.7 C)  TempSrc: Oral Oral Oral Oral  Resp: 15 16 17 18   Height:      Weight:  243 lb 12.8 oz (110.587 kg)    SpO2: 100% 100% 100% 100%   Weight change:   Intake/Output Summary (Last 24 hours) at 10/31/14 1325 Last data filed at 10/30/14 2151  Gross per 24 hour  Intake   1105 ml  Output      0 ml  Net   1105 ml   Physical Exam General: alert, sitting up in bed, NAD HEENT: [place]/AT, EOMI, mucus membranes moist CV: RRR, no m/g/r Pulm: CTA bilaterally, breaths non-labored Abd: BS+, soft, non-tender, non-distended Ext: warm, no edema, moves all Neuro: alert and oriented x 3, no focal deficits  Lab Results: Basic Metabolic Panel:  Recent Labs Lab 10/30/14 1049 10/31/14 0602  NA 136 139  K 3.4* 3.2*  CL 102 103  CO2 29 30  GLUCOSE 248* 177*  BUN 31* 16  CREATININE 1.95* 1.38*  CALCIUM 8.2* 8.3*  MG  --  1.6   CBC:  Recent Labs Lab 10/30/14 1011 10/30/14 1857 10/31/14 0602  WBC 6.0 5.1 5.5  NEUTROABS 4.6  --   --   HGB 8.3* 9.0* 8.5*  HCT 23.6* 25.7* 24.6*  MCV 84.3 86.8 86.3  PLT 159 143* 140*   CBG:  Recent Labs Lab 10/30/14 1016 10/30/14 2147 10/31/14 0831  GLUCAP 186* 220* 189*   Coagulation:  Recent Labs Lab 10/30/14 1159  LABPROT 15.5*  INR 1.22   Medications: I have reviewed the patient's current medications.  Assessment/Plan:  Acute Lower GI Bleeding: Patient presented with 1 day history of hematochezia and dizziness in the setting of recent coloscopy with polyp removal and antiplatelet therapy  (plavix) found to have an acute anemia. Hbg stable in 8.5-9.0 range. Awaiting GI's plans for scoping.  - GI consulted, appreciate recommendations - If does not have colonoscopy as inpatient will need to be seen as outpatient with GI - Clears for now - Repeat CBC in AM  Symptomatic Anemia: Patient presented with dizziness and syncope at home found to have Hbg 8.3, down from baseline 12-13. He was given 1 unit PRBCs on 2/26. Post-transfusion Hbg 9.0. Patient no longer symptomatic. Anemia likely related to acute lower GI bleeding as stated above.  - CBC in AM  AKI on ?CKD Stage 3: Cr 1.95 on admission, improved to 1.38 this morning. Baseline 1.2? Unclear baseline as Cr ranges between 1.2-1.5 since 2014. Likely elevated in setting of recent GI bleed (volume loss). Patient repleted with IVF.  - Repeat bmet in AM  Hypokalemia: K 3.2 this morning. Potassium was repleted.  - Repeat bmet in AM  HTN: BP in [id number] systolic. Will restart his home meds gradually. 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 Patient takes Lantus 30-49  units QHS and Novolog 5-15 units TID with meals. He also takes Gabapentin 300 mg daily PRN for his peripheral neuropathy. - Hold home Lantus - Continue moderate ISS - CBGs 4 times daily  Hx CVA: Patient with history of CVA 2 years ago. He has been on Plavix ever since.  - Hold Plavix given acute GI bleeding   Diet: Clear liquids VTE PPx: SCDs Dispo: Discharge likely tomorrow, depending on GI   The patient does not have a current PCP (Pcp Not In System) and does need an [facility] hospital follow-up appointment after discharge.  The patient does not have transportation limitations that hinder transportation to clinic appointments.  .Services Needed at time of discharge: Y = Yes, Blank = No PT:     OT:   RN:   Equipment:   Other:     LOS: 1 day   Albin Felling, MD 10/31/2014, 1:25 PM

## 2014-10-31 NOTE — Progress Notes (Signed)
Patient ID: Cody Silva, male   DOB: Mar 15, 1946, 69 y.o.   MRN: [id number] [facility] Gastroenterology Progress Note  Cody Silva 69 y.o. [birth year]   Subjective: No further rectal bleeding. No blood with colon prep per nursing. Hungry. Wife at bedside.  Objective: Vital signs in last 24 hours: Filed Vitals:   10/31/14 0945  BP: 196/76  Pulse: 87  Temp: 98 F (36.7 C)  Resp: 18    Physical Exam: Gen: alert, no acute distress CV: RRR Chest: Clear to auscultation anteriorly Abd: soft, nontender, nondistended, +BS  Lab Results:  Recent Labs  10/30/14 1049 10/31/14 0602  NA 136 139  K 3.4* 3.2*  CL 102 103  CO2 29 30  GLUCOSE 248* 177*  BUN 31* 16  CREATININE 1.95* 1.38*  CALCIUM 8.2* 8.3*  MG  --  1.6    Recent Labs  10/30/14 1049  AST 18  ALT 17  ALKPHOS 44  BILITOT 0.6  PROT 5.0*  ALBUMIN 2.7*    Recent Labs  10/30/14 1011 10/30/14 1857 10/31/14 0602  WBC 6.0 5.1 5.5  NEUTROABS 4.6  --   --   HGB 8.3* 9.0* 8.5*  HCT 23.6* 25.7* 24.6*  MCV 84.3 86.8 86.3  PLT 159 143* 140*    Recent Labs  10/30/14 1159  LABPROT 15.5*  INR 1.22      Assessment/Plan: Suspect post-polypectomy bleed that has resolved. Slowly advance diet. Follow H/H. If stable, then home tomorrow from GI standpoint. Would hold Plavix for another 7 days if possible.   Ringling C. 10/31/2014, 2:49 PM

## 2014-10-31 NOTE — H&P (Signed)
Internal Medicine Attending Admission Note Date: 10/31/2014  Patient name: Cody Silva Medical record number: [id number] Date of birth: [birth year] Age: 69 y.o. Gender: male  I saw and evaluated the patient. I reviewed the resident's note and I agree with the resident's findings and plan as documented in the resident's note.  Chief Complaint(s): Lower GI bleed, near syncope  History - key components related to admission:  Mr. 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 On the evening prior to admission he had some diarrhea and on the morning of admission he became dizzy when he got up to the bathroom. His wife found him minimally responsive on the toilet with bright dark blood from the rectum. He denied any chest pain, palpitations, shortness of breath, vertigo, or abdominal pain. He restarted his Plavix one day ago. He was admitted to the internal medicine teaching service for further evaluation and care.  Physical Exam - key components related to admission:  Filed Vitals:   10/30/14 1708 10/30/14 2151 10/31/14 0523 10/31/14 0945  BP: 149/72 173/95 164/73 196/76  Pulse: 79 84 87 87  Temp: 98.3 F (36.8 C) 98.2 F (36.8 C) 97.9 F (36.6 C) 98 F (36.7 C)  TempSrc: Oral Oral Oral Oral  Resp: 15 16 17 18   Height:      Weight:  243 lb 12.8 oz (110.587 kg)    SpO2: 100% 100% 100% 100%   Gen.: Well-developed, well-nourished, man lying comfortably in bed in no acute distress. Abdomen: Soft, nontender, active bowel sounds.  Lab results:  Basic Metabolic Panel:  Recent Labs  10/30/14 1049 10/31/14 0602  NA 136 139  K 3.4* 3.2*  CL 102 103  CO2 29 30  GLUCOSE 248* 177*  BUN 31* 16  CREATININE 1.95*  1.38*  CALCIUM 8.2* 8.3*  MG  --  1.6   Liver Function Tests:  Recent Labs  10/30/14 1049  AST 18  ALT 17  ALKPHOS 44  BILITOT 0.6  PROT 5.0*  ALBUMIN 2.7*   CBC:  Recent Labs  10/30/14 1011 10/30/14 1857 10/31/14 0602  WBC 6.0 5.1 5.5  NEUTROABS 4.6  --   --   HGB 8.3* 9.0* 8.5*  HCT 23.6* 25.7* 24.6*  MCV 84.3 86.8 86.3  PLT 159 143* 140*   Cardiac Enzymes:  Recent Labs  10/30/14 1952 10/31/14 0602  TROPONINI 0.03 0.03   CBG:  Recent Labs  10/30/14 1016 10/30/14 2147 10/31/14 0831  GLUCAP 186* 220* 189*   Anemia Panel:  Recent Labs  10/30/14 1011  RETICCTPCT 2.0   Coagulation:  Recent Labs  10/30/14 1159  INR 1.22   Urinalysis:  Specific gravity 1.018, pH 5.0, protein 30, nitrite negative, leukocytes negative, 0-2 white blood cells per high-power field.  Other results:  EKG: Normal sinus rhythm at 75 bpm, normal axis, normal intervals, no significant Q waves, no LVH by voltage, good R wave progression, no ST changes, lateral T wave inversion in a strain pattern, ECG is unchanged from the previous ECG on 03/17/2014.  Assessment & Plan by Problem:  Mr. Cody Silva is a 69 year old man with a history of colonic polyps status post endoscopic extraction earlier this week who presents with near syncope and dark blood the the rectum. This almost  certainly represents bleeding from his polypectomy sites. He's received 1 unit of packed red blood cells with the near expected bump in his hemoglobin but his hemoglobin has fallen since. Clinically he's had no further blood in his stool per his observation. His Plavix has been held as this could contribute to the continued oozing. At this point he is hemodynamically stable and being prepped for possible colonoscopy.  1) Lower GI bleed likely from polypectomy site: We are continuing to follow serial CBCs.  We will maintain an active type and screen and transfuse if his hemoglobin drops below 8. We'll maintain 2  large-bore IVs. We will hold Plavix and not restart on discharge until one week later. We are waiting GIs decision on colonoscopy today. Given the slow decline in his hemoglobin, even if colonoscopy is not done he will be observed overnight so we may continue to follow serial hematocrits.

## 2014-11-01 DIAGNOSIS — N179 Acute kidney failure, unspecified: Secondary | ICD-10-CM

## 2014-11-01 DIAGNOSIS — K922 Gastrointestinal hemorrhage, unspecified: Secondary | ICD-10-CM

## 2014-11-01 DIAGNOSIS — E119 Type 2 diabetes mellitus without complications: Secondary | ICD-10-CM

## 2014-11-01 DIAGNOSIS — R55 Syncope and collapse: Secondary | ICD-10-CM

## 2014-11-01 LAB — CBC
HCT: 23.5 % — ABNORMAL LOW (ref 39.0–52.0)
HCT: 23.9 % — ABNORMAL LOW (ref 39.0–52.0)
Hemoglobin: 8.1 g/dL — ABNORMAL LOW (ref 13.0–17.0)
Hemoglobin: 8.4 g/dL — ABNORMAL LOW (ref 13.0–17.0)
MCH: 30.2 pg (ref 26.0–34.0)
MCH: 30.4 pg (ref 26.0–34.0)
MCHC: 34.5 g/dL (ref 30.0–36.0)
MCHC: 35.1 g/dL (ref 30.0–36.0)
MCV: 87.7 fL (ref 78.0–100.0)
MCV: 86.6 fL (ref 78.0–100.0)
PLATELETS: 155 10*3/uL (ref 150–400)
Platelets: 132 10*3/uL — ABNORMAL LOW (ref 150–400)
RBC: 2.68 MIL/uL — ABNORMAL LOW (ref 4.22–5.81)
RBC: 2.76 MIL/uL — ABNORMAL LOW (ref 4.22–5.81)
RDW: 14.2 % (ref 11.5–15.5)
RDW: 14.1 % (ref 11.5–15.5)
WBC: 4.9 10*3/uL (ref 4.0–10.5)
WBC: 4.9 10*3/uL (ref 4.0–10.5)

## 2014-11-01 LAB — HIV ANTIBODY (ROUTINE TESTING W REFLEX): HIV SCREEN 4TH GENERATION: NONREACTIVE

## 2014-11-01 LAB — GLUCOSE, CAPILLARY
Glucose-Capillary: 174 mg/dL — ABNORMAL HIGH (ref 70–99)
Glucose-Capillary: 219 mg/dL — ABNORMAL HIGH (ref 70–99)

## 2014-11-01 LAB — BASIC METABOLIC PANEL
ANION GAP: 4 — AB (ref 5–15)
BUN: 11 mg/dL (ref 6–23)
CALCIUM: 8.5 mg/dL (ref 8.4–10.5)
CO2: 29 mmol/L (ref 19–32)
Chloride: 106 mmol/L (ref 96–112)
Creatinine, Ser: 1.35 mg/dL (ref 0.50–1.35)
GFR calc Af Amer: 61 mL/min — ABNORMAL LOW (ref >90)
GFR calc non Af Amer: 53 mL/min — ABNORMAL LOW (ref >90)
Glucose, Bld: 192 mg/dL — ABNORMAL HIGH (ref 70–99)
POTASSIUM: 3.4 mmol/L — AB (ref 3.5–5.1)
SODIUM: 139 mmol/L (ref 135–145)

## 2014-11-01 LAB — MAGNESIUM: MAGNESIUM: 1.8 mg/dL (ref 1.5–2.5)

## 2014-11-01 MED ORDER — SPIRONOLACTONE 25 MG PO TABS
25.0000 mg | ORAL_TABLET | Freq: Every day | ORAL | Status: DC
  Administered 2014-11-01: 25 mg via ORAL
  Filled 2014-11-01: qty 1

## 2014-11-01 MED ORDER — CLOPIDOGREL BISULFATE 75 MG PO TABS: 75.0000 mg | ORAL_TABLET | Freq: Every day | ORAL | Status: AC

## 2014-11-01 MED ORDER — ACETAMINOPHEN 500 MG PO TABS: 500.0000 mg | ORAL_TABLET | Freq: Four times a day (QID) | ORAL | Status: DC | PRN

## 2014-11-01 MED ORDER — ACETAMINOPHEN 500 MG PO TABS: 500.0000 mg | ORAL_TABLET | Freq: Four times a day (QID) | ORAL | Status: AC | PRN

## 2014-11-01 MED ORDER — LOSARTAN POTASSIUM 50 MG PO TABS
100.0000 mg | ORAL_TABLET | Freq: Every day | ORAL | Status: DC
  Administered 2014-11-01: 100 mg via ORAL
  Filled 2014-11-01: qty 2

## 2014-11-01 MED ORDER — POTASSIUM CHLORIDE CRYS ER 20 MEQ PO TBCR
40.0000 meq | EXTENDED_RELEASE_TABLET | Freq: Once | ORAL | Status: AC
  Administered 2014-11-01: 40 meq via ORAL
  Filled 2014-11-01: qty 2

## 2014-11-01 NOTE — Discharge Instructions (Signed)
-  Don't take plavix for 1 week until next Sunday March 6 -Don't take any NSAID's such as ibuprofen, advil, motrin or aspirin, goody/BC powder for now -If you have pain take over the counter tylenol  -You need to have your CBC (blood counts) checked early next week by your primary care doctor, your last Hemoglobin was 8.4 today on 2/28. When you came in your hemoglobin was 8.3 and we gave you 1 unit of blood. -If your bleeding returns or pass out again you need to come to the ED again  -You can take all your other home medications -Pleasure meeting you, hope you were satisfied with our care! My name is Dr. Juluis Mire, pager is [phone]. I will try to fax your discharge note to your primary care doctor.

## 2014-11-01 NOTE — Progress Notes (Signed)
Internal Medicine Attending  Date: 11/01/2014  Patient name: Cody Silva Medical record number: [id number] Date of birth: 29-Dec-1945 Age: 69 y.o. Gender: male  I saw and evaluated the patient. I reviewed the resident's note by Dr. Naaman Plummer and I agree with the resident's findings and plans as documented in her progress note.  He felt well this morning with no further bleeding from the rectum although he has not had a bowel movement in the last 24 hours. His hemoglobin has stabilized. Examination was completely benign from an abdominal standpoint. Gastroenterology saw him and felt he required no further endoscopic evaluation and he was ready for discharge with a stable Hgb with follow-up by his PCP early this week.

## 2014-11-01 NOTE — Progress Notes (Signed)
Subjective:  Pt seen and examined in AM. No acute events overnight. He reports having a BM yesterday and did not notice any frank blood. He is tolerating soft diet with no nausea, vomiting, or abdominal pain.    Objective: Vital signs in last 24 hours: Filed Vitals:   10/31/14 0945 10/31/14 1703 10/31/14 2057 11/01/14 0411  BP: 196/76 169/88 148/80 161/76  Pulse: 87 94 83 70  Temp: 98 F (36.7 C) 98.8 F (37.1 C) 98.6 F (37 C) 98.5 F (36.9 C)  TempSrc: Oral Oral Oral Oral  Resp: 18 18 16 16   Height:      Weight:   253 lb 11.2 oz (115.078 kg)   SpO2: 100% 100% 100% 97%   Weight change: 18 lb 11.2 oz (8.482 kg)  Intake/Output Summary (Last 24 hours) at 11/01/14 1009 Last data filed at 10/31/14 1705  Gross per 24 hour  Intake    360 ml  Output      4 ml  Net    356 ml   Physical Exam General: alert, sitting up in bed, NAD HEENT: [place]/AT, EOMI, mucus membranes moist CV: RRR, no m/g/r Pulm: CTA bilaterally, breaths non-labored Abd: BS+, soft, non-tender, non-distended Ext: warm, no edema, moves all Neuro: alert and oriented x 3, no focal deficits  Lab Results: Basic Metabolic Panel:  Recent Labs Lab 10/31/14 0602 11/01/14 0505  NA 139 139  K 3.2* 3.4*  CL 103 106  CO2 30 29  GLUCOSE 177* 192*  BUN 16 11  CREATININE 1.38* 1.35  CALCIUM 8.3* 8.5  MG 1.6 1.8   CBC:  Recent Labs Lab 10/30/14 1011  10/31/14 0602 11/01/14 0505  WBC 6.0  < > 5.5 4.9  NEUTROABS 4.6  --   --   --   HGB 8.3*  < > 8.5* 8.1*  HCT 23.6*  < > 24.6* 23.5*  MCV 84.3  < > 86.3 87.7  PLT 159  < > 140* 132*  < > = values in this interval not displayed. CBG:  Recent Labs Lab 10/30/14 2147 10/31/14 0831 10/31/14 1214 10/31/14 1638 10/31/14 2105 11/01/14 0801  GLUCAP 220* 189* 179* 189* 230* 174*   Coagulation:  Recent Labs Lab 10/30/14 1159  LABPROT 15.5*  INR 1.22   Medications: I have reviewed the patient's current medications.  Assessment/Plan:  Acute Lower  GI Bleeding s/p recent polypectomy : No further bleeding or symptomatic anemia. Hg this AM 8.1 with repeat Hg stable at 8.4. His Hg was 8.3 on admission and is s/p1 pRBC for symptomatic anemia.     -GI consulted, appreciate recommendations -Per GI no repeat colonoscopy warranted   -Hold Plavix for 1 week and avoid NSAID and ASA use -Advance soft to regular diet -Per GI stable for discharge today with repeat CBC with PCP next week  Symptomatic Anemia in setting of lower GI bleed and chronic AP use - Hg this AM 8.1 with repeat Hg stable at 8.4. His Hg was 8.3 on admission and is s/p1 pRBC for symptomatic anemia.     -Repeat CBC with PCP next week  CKD Stage 3: Cr 1.95 on admission, improved to 1.35 this morning at baseline 1.2.   Hypokalemia: K 3.4 this morning. Mg 1.8.  -Kdur 40 mEq given x 1    HTN: BP 161/76. -Continue Coreg 25 mg BID and  Clonidine 0.3 mg patch - Restart home Losartan 100 mg daily and spironolactone 25 mg daily     Type 2  DM with Peripheral Neuropathy and Retinopathy: Last CBG 171. Last HbA1c 7.5 on 03/01/13. Patient takes Lantus 30-49 units QHS and Novolog 5-15 units TID with meals. He also takes Gabapentin 300 mg daily PRN for his peripheral neuropathy. - Hold home Lantus, continue normal dose at discharge - ontinue moderate ISS -CBGs 4 times daily -Continue home gabapentin 300 mg PRN  Hx CVA: Patient with history of CVA 2 years ago. He has been on Plavix ever since.  - Hold Plavix given acute GI bleeding for 1 week on discharg  Diet: Regular VTE PPx: SCDs Dispo: Discharge today   The patient does not have a current PCP (Pcp Not In System) and does need an [facility] hospital follow-up appointment after discharge.  The patient does not have transportation limitations that hinder transportation to clinic appointments.  .Services Needed at time of discharge: Y = Yes, Blank = No PT:   OT:   RN:   Equipment:   Other:     LOS: 2 days   Juluis Mire,  MD 11/01/2014, 10:09 AM

## 2014-11-01 NOTE — Progress Notes (Signed)
Patient ID: Cody Silva, male   DOB: 06-Jul-1946, 69 y.o.   MRN: [id number] [facility] Gastroenterology Progress Note  Cody Silva 69 y.o. [birth year]   Subjective: Feels good. Tolerating diet. No rectal bleeding.  Objective: Vital signs: Filed Vitals:   11/01/14 0411  BP: 161/76  Pulse: 70  Temp: 98.5 F (36.9 C)  Resp: 16    Physical Exam: Gen: alert, no acute distress  Abd: soft, nontender, nondistended  Lab Results:  Recent Labs  10/31/14 0602 11/01/14 0505  NA 139 139  K 3.2* 3.4*  CL 103 106  CO2 30 29  GLUCOSE 177* 192*  BUN 16 11  CREATININE 1.38* 1.35  CALCIUM 8.3* 8.5  MG 1.6 1.8    Recent Labs  10/30/14 1049  AST 18  ALT 17  ALKPHOS 44  BILITOT 0.6  PROT 5.0*  ALBUMIN 2.7*    Recent Labs  10/30/14 1011  10/31/14 0602 11/01/14 0505  WBC 6.0  < > 5.5 4.9  NEUTROABS 4.6  --   --   --   HGB 8.3*  < > 8.5* 8.1*  HCT 23.6*  < > 24.6* 23.5*  MCV 84.3  < > 86.3 87.7  PLT 159  < > 140* 132*  < > = values in this interval not displayed.    Assessment/Plan: S/P postpolypectomy bleed that has resolved. Repeat colonoscopy NOT needed. Advance diet. Recheck Hgb this afternoon and if stable ok to go home with repeat CBC by his PCP next week. Will sign off. Call if questions.   Hoquiam C. 11/01/2014, 1:15 PM

## 2014-11-01 NOTE — Discharge Summary (Signed)
Name: Cody Silva MRN: [id number] DOB: April 24, 1946 69 y.o. PCP: [facility] in [place], [place]  Date of Admission: 10/30/2014  9:58 AM Date of Discharge: 11/01/2014 Attending Physician: Karren Cobble, MD  Discharge Diagnosis:  Acute Lower GI Bleed s/p recent polypectomy  Syncope in setting of Symptomatic Anemia  History of CVA  AKI on CKD Stage 3  Hypokalemia Hypertension  Insulin Dependent Type 2 Diabetes Mellitus HIV Testing  DVT Prophylaxis      Discharge Medications:   Medication List    STOP taking these medications        ARTHRITIS STRENGTH BC POWDER PO      TAKE these medications        acetaminophen 500 MG tablet  Commonly known as:  TYLENOL  Take 1 tablet (500 mg total) by mouth every 6 (six) hours as needed for mild pain, moderate pain, fever or headache.     albuterol 108 (90 BASE) MCG/ACT inhaler  Commonly known as:  PROVENTIL HFA;VENTOLIN HFA  Inhale 2 puffs into the lungs every 6 (six) hours as needed for wheezing.     carvedilol 25 MG tablet  Commonly known as:  COREG  Take 25 mg by mouth 2 (two) times daily with a meal.     citalopram 20 MG tablet  Commonly known as:  CELEXA  Take 20 mg by mouth daily.     cloNIDine 0.3 mg/24hr patch  Commonly known as:  CATAPRES - Dosed in mg/24 hr  Place 1 patch onto the skin once a week.     clopidogrel 75 MG tablet  Commonly known as:  PLAVIX  Take 1 tablet (75 mg total) by mouth at bedtime.  Start taking on:  11/08/2014     eucerin cream  Apply 1 application topically daily.     fluocinonide cream 0.05 %  Commonly known as:  LIDEX  Apply 1 application topically 2 (two) times daily.     fluticasone 50 MCG/ACT nasal spray  Commonly known as:  FLONASE  Place 2 sprays into the nose daily.     gabapentin 300 MG capsule  Commonly known as:  NEURONTIN  Take 300 mg by mouth daily as needed (leg pain).     insulin aspart 100 UNIT/ML injection  Commonly known as:  novoLOG   Inject 5-15 Units into the skin 3 (three) times daily with meals.     insulin glargine 100 UNIT/ML injection  Commonly known as:  LANTUS  Inject 30-49 Units into the skin at bedtime.     latanoprost 0.005 % ophthalmic solution  Commonly known as:  XALATAN  Place 1 drop into both eyes at bedtime.     losartan 100 MG tablet  Commonly known as:  COZAAR  Take 100 mg by mouth daily.     MULTIVITAMIN PO  Take 1 tablet by mouth daily.     polyvinyl alcohol 1.4 % ophthalmic solution  Commonly known as:  LIQUIFILM TEARS  Place 1 drop into both eyes 5 (five) times daily as needed (for dry eyes).     simvastatin 40 MG tablet  Commonly known as:  ZOCOR  Take 40 mg by mouth every evening.     spironolactone 25 MG tablet  Commonly known as:  ALDACTONE  Take 25 mg by mouth daily.        Disposition and follow-up:   Cody Silva was discharged from [facility] in Good condition.  At the hospital follow  up visit please address:  1.  Resolution of lower GI bleeding after polypectomy and stabilization of Hg (discharge Hg 8.4 on 11/01/14, baseline 11-13), required 1 pRBC on admission due to symptomatic anemia (syncopal episode due to orthostatic hypotension ) with Hg of 8.3. on admission.  Instructed to resume plavix in 1 week (March 6) and avoid NSAID's and ASA use.   2.  Labs / imaging needed at time of follow-up: CBC, BMP (K)  3.  Pending labs/ test needing follow-up: None  Follow-up Appointments:   Discharge Instructions:  -Don't take plavix for 1 week until next Sunday March 6 -Don't take any NSAID's such as ibuprofen, advil, motrin or aspirin, goody/BC powder for now -If you have pain take over the counter tylenol  -You need to have your CBC (blood counts) checked early next week by your primary care doctor, your last Hemoglobin was 8.4 today on 2/28. When you came in your hemoglobin was 8.3 and we gave you 1 unit of blood. -If your bleeding returns or  pass out again you need to come to the ED again  -You can take all your other home medications -Pleasure meeting you, hope you were satisfied with our care! My name is Cody Silva, pager is [phone]. I will try to fax your discharge note to your primary care doctor.  Consultations: Treatment Team:  Arta Silence, MD  Procedures Performed: None   Admission HPI: Original Cody Cables MD  Cody Silva is a 69yo man with PMHx of HTN, Type 2 DM with peripheral neuropathy and retinopathy, and CVA in 2014 who presents to the ED after having a syncopal episode at home. Patient reports last night he had some diarrhea and then this morning he went to the bathroom when he suddenly became dizzy, diaphoretic, and had to lean himself against the wall. His wife states she heard him get up to go to the bathroom and when he didn't respond to she went to the bathroom to check on him and found him passed out against the wall. She states he had dark red blood coming from his rectum. Patient states he had dark red blood in his stool. He also notes he had a colonoscopy last Wednesday with seven "large" polyps removed. His wife states that same day his blood pressure had dropped to the [id number] systolic and that he had a near syncopal event. He reports some nausea and vomiting, but denies chest pain, palpitations, dyspnea, abdominal pain, and vertigo. He is on Plavix for stroke that occurred 2 years ago.[name] He denies excessive NSAID use.   In the ED, patient's Hbg found to be acutely low at 8.3, down from his baseline 12-13. FOBT positive. He was given 1 unit PRBCs.     Hospital Course by problem list:   Acute Lower GI Bleed s/p recent polypectomy - Pt had recent screening colonoscopy 1 week prior to admission with removal of 7 large polyps. He had no prior history of GI bleeding. He had some bleeding immediately post-polypectomy and then had recurrent bleeding in the form of hematochezia and BRBPR one day  prior to admission. He recently restarted his plavix that was on hold for the colonoscopy (took 1 dose). He also had been intermittently taking NSAID's and BC powder. He was hemodynamically stable on admission with positive FOBT and Hg of 8.1 requiring 1 pRBC due to syncopal episode at home and symptoms of lightheadedness with standing on admission with orthostatic hypotension. Etiology most likely due to  post-polypectomy bleeding. He was seen by GI who closely observed for recurrent bleeding which he did not have during hospitalization. His diet was slowly advanced which he tolerated well without difficulty. His hemoglobin was closely monitored and remained stable at 8.4 on discharge with no symptoms of lightheadedness with standing or further episodes of syncope during hospitalization. He was instructed to hold his plavix for 1 week and avoid NSAID and aspirin use. He is to have repeat CBC in a few days with his PCP and was instructed to return if he had any episodes of bleeding or syncope which he verbalized understanding.     Syncope in setting of Symptomatic Anemia - Pt presented with syncopal episode at home after having hematochezia and BRBPR s/p recent polypectomy 1 week prior to admission. He recently restarted his plavix that was on hold for his recent colonoscopy (took 1 dose). He also had been intermittently taking NSAID's and BC powder. He was hemodynamically stable on admission with positive FOBT and Hg of 8.1 below his baseline of 11-13, requiring 1 pRBC due to syncopal episode at home and symptoms of lightheadedness with standing on admission. He had orthostatic hypotension on admission. Unfortunately anemia panel was not draw before blood transfusion except for reticulocytes which was normal. PTT was normal and PT was mildly elevated at 15.5. UA did not reveal hematuria. Cycled troponins were negative. Etiology most likely due to post-polypectomy bleeding. He was seen by GI who closely observed  for recurrent bleeding which he did not have during hospitalization. His hemoglobin was closely monitored and remained stable at 8.4 on discharge with no symptoms of lightheadedness with standing or further episodes of syncope during hospitalization. He was instructed to hold his plavix for 1 week and avoid NSAID and aspirin use. He is to have repeat CBC in a few days with his PCP and was instructed to return if he had any episodes of bleeding or syncope which he verbalized understanding.  History of CVA - Pt was on plavix daily for history of ischemic CVA two years ago. He had just restarted taking it (1 dose) after his recent colonoscopy 1 week ago. He was instructed to restart taking it in one week per GI recommendations. He did not have symptoms of TIA or CVA during hospitalization.     AKI on CKD Stage 3 - Pt with Cr  of 1.95 on admission above baseline 1.2-1.5  In setting of GI bleed, volume depletion, and NSAID use. Pt initially received IVF's and blood transfusion with improvement of Cr to  1.35. Pt's losartan and spironolactone were initially held but then resumed with resolution of AKI.  Pt was instructed to avoid NSAID use in setting of GI bleed.   Hypokalemia - Pt with potassium level of 3.2 to 3.5 during hospitalization and received potassium and magnesium replacement. Etiology most likely due to decreased PO intake and hypomagnesemia (Mg levels of  1.6-1.8).  Pt to have repeat BMP at follow-up visit with PCP.   Hypertension -  Pt with blood pressure range of 128/70 - 196/76 during hospitalization. Pt at home on carvedilol 25 mg BID, losartan 100 mg daily, clonidine 0.3 mg/24 hr patch, and spironolactone 25 mg daily at home. Pt's antihypertensives were initially held in setting of orthostatic hypotension and acute GI bleed. His home medications were late resumed when he became hypertensive and was instructed to continue his normal regimen on discharge.   Insulin Dependent Type 2 Diabetes  Mellitus - Pt with last A1c of  7.5 on 03/01/13. Pt at home on Lantus 30-49 units daily and Novolog 5-15 units TID with meals. Pt received moderate sliding scale insulin during hospitalization with glucose levels of 174-248 during hospitalization with no symptomatic hypoglycemia.  Pt's home gabapentin 300 mg daily as needed was continued for his chronic peripheral neuropathy. Pt was instructed to resume his normal insulin regimen on discharge.   HIV Testing- Pt was negative for HIV antibody testing.   DVT Prophylaxis - Pt received SCD's during hospitalization in setting of acute GI bleed with no evidence of DVT during hospitalization.    Discharge Vitals:   BP 145/69 mmHg  Pulse 76  Temp(Src) 98.7 F (37.1 C) (Oral)  Resp 16  Ht 6\' 4"  (1.93 m)  Wt 253 lb 11.2 oz (115.078 kg)  BMI 30.89 kg/m2  SpO2 98%  Discharge Labs:  Results for orders placed or performed during the hospital encounter of 10/30/14 (from the past 24 hour(s))  Glucose, capillary     Status: Abnormal   Collection Time: 10/31/14  4:38 PM  Result Value Ref Range   Glucose-Capillary 189 (H) 70 - 99 mg/dL   Comment 1 Notify RN    Comment 2 Documented in Char   Glucose, capillary     Status: Abnormal   Collection Time: 10/31/14  9:05 PM  Result Value Ref Range   Glucose-Capillary 230 (H) 70 - 99 mg/dL  Basic metabolic panel     Status: Abnormal   Collection Time: 11/01/14  5:05 AM  Result Value Ref Range   Sodium 139 135 - 145 mmol/L   Potassium 3.4 (L) 3.5 - 5.1 mmol/L   Chloride 106 96 - 112 mmol/L   CO2 29 19 - 32 mmol/L   Glucose, Bld 192 (H) 70 - 99 mg/dL   BUN 11 6 - 23 mg/dL   Creatinine, Ser 1.35 0.50 - 1.35 mg/dL   Calcium 8.5 8.4 - 10.5 mg/dL   GFR calc non Af Amer 53 (L) >90 mL/min   GFR calc Af Amer 61 (L) >90 mL/min   Anion gap 4 (L) 5 - 15  CBC     Status: Abnormal   Collection Time: 11/01/14  5:05 AM  Result Value Ref Range   WBC 4.9 4.0 - 10.5 K/uL   RBC 2.68 (L) 4.22 - 5.81 MIL/uL   Hemoglobin  8.1 (L) 13.0 - 17.0 g/dL   HCT 23.5 (L) 39.0 - 52.0 %   MCV 87.7 78.0 - 100.0 fL   MCH 30.2 26.0 - 34.0 pg   MCHC 34.5 30.0 - 36.0 g/dL   RDW 14.2 11.5 - 15.5 %   Platelets 132 (L) 150 - 400 K/uL  Magnesium     Status: None   Collection Time: 11/01/14  5:05 AM  Result Value Ref Range   Magnesium 1.8 1.5 - 2.5 mg/dL  Glucose, capillary     Status: Abnormal   Collection Time: 11/01/14  8:01 AM  Result Value Ref Range   Glucose-Capillary 174 (H) 70 - 99 mg/dL   Comment 1 Notify RN    Comment 2 Documented in Char   CBC     Status: Abnormal   Collection Time: 11/01/14  1:48 PM  Result Value Ref Range   WBC 4.9 4.0 - 10.5 K/uL   RBC 2.76 (L) 4.22 - 5.81 MIL/uL   Hemoglobin 8.4 (L) 13.0 - 17.0 g/dL   HCT 23.9 (L) 39.0 - 52.0 %   MCV 86.6 78.0 - 100.0 fL  MCH 30.4 26.0 - 34.0 pg   MCHC 35.1 30.0 - 36.0 g/dL   RDW 14.1 11.5 - 15.5 %   Platelets 155 150 - 400 K/uL    Signed: Juluis Mire, MD 11/01/2014, 4:28 PM    Services Ordered on Discharge: None Equipment Ordered on Discharge: None

## 2014-11-01 NOTE — Progress Notes (Signed)
UR Completed.  [phone]  

## 2014-11-02 ENCOUNTER — Encounter (HOSPITAL_COMMUNITY): Payer: Self-pay | Admitting: *Deleted

## 2014-11-02 ENCOUNTER — Emergency Department (HOSPITAL_COMMUNITY)
Admission: EM | Admit: 2014-11-02 | Discharge: 2014-11-03 | Disposition: A | Discharge status: Home / Self Care | Payer: Medicare Other | Attending: Emergency Medicine | Admitting: Emergency Medicine

## 2014-11-02 DIAGNOSIS — Z7951 Long term (current) use of inhaled steroids: Secondary | ICD-10-CM | POA: Diagnosis not present

## 2014-11-02 DIAGNOSIS — R802 Orthostatic proteinuria, unspecified: Secondary | ICD-10-CM | POA: Diagnosis not present

## 2014-11-02 DIAGNOSIS — N179 Acute kidney failure, unspecified: Secondary | ICD-10-CM

## 2014-11-02 DIAGNOSIS — R1013 Epigastric pain: Secondary | ICD-10-CM | POA: Diagnosis present

## 2014-11-02 DIAGNOSIS — Z8659 Personal history of other mental and behavioral disorders: Secondary | ICD-10-CM | POA: Insufficient documentation

## 2014-11-02 DIAGNOSIS — Z79899 Other long term (current) drug therapy: Secondary | ICD-10-CM | POA: Diagnosis not present

## 2014-11-02 DIAGNOSIS — E1140 Type 2 diabetes mellitus with diabetic neuropathy, unspecified: Secondary | ICD-10-CM | POA: Insufficient documentation

## 2014-11-02 DIAGNOSIS — I10 Essential (primary) hypertension: Secondary | ICD-10-CM | POA: Diagnosis not present

## 2014-11-02 DIAGNOSIS — Z794 Long term (current) use of insulin: Secondary | ICD-10-CM | POA: Diagnosis not present

## 2014-11-02 DIAGNOSIS — D649 Anemia, unspecified: Secondary | ICD-10-CM | POA: Diagnosis not present

## 2014-11-02 DIAGNOSIS — E11319 Type 2 diabetes mellitus with unspecified diabetic retinopathy without macular edema: Secondary | ICD-10-CM | POA: Diagnosis not present

## 2014-11-02 DIAGNOSIS — G4733 Obstructive sleep apnea (adult) (pediatric): Secondary | ICD-10-CM | POA: Diagnosis not present

## 2014-11-02 DIAGNOSIS — Z9981 Dependence on supplemental oxygen: Secondary | ICD-10-CM | POA: Diagnosis not present

## 2014-11-02 DIAGNOSIS — E1165 Type 2 diabetes mellitus with hyperglycemia: Secondary | ICD-10-CM | POA: Insufficient documentation

## 2014-11-02 DIAGNOSIS — I951 Orthostatic hypotension: Secondary | ICD-10-CM

## 2014-11-02 DIAGNOSIS — R112 Nausea with vomiting, unspecified: Secondary | ICD-10-CM

## 2014-11-02 DIAGNOSIS — Z8673 Personal history of transient ischemic attack (TIA), and cerebral infarction without residual deficits: Secondary | ICD-10-CM | POA: Insufficient documentation

## 2014-11-02 DIAGNOSIS — Z8739 Personal history of other diseases of the musculoskeletal system and connective tissue: Secondary | ICD-10-CM | POA: Insufficient documentation

## 2014-11-02 DIAGNOSIS — R55 Syncope and collapse: Secondary | ICD-10-CM | POA: Insufficient documentation

## 2014-11-02 DIAGNOSIS — Z86010 Personal history of colonic polyps: Secondary | ICD-10-CM | POA: Diagnosis not present

## 2014-11-02 LAB — TYPE AND SCREEN
ABO/RH(D): O POS
ANTIBODY SCREEN: NEGATIVE
Unit division: 0
Unit division: 0

## 2014-11-02 LAB — CBC WITH DIFFERENTIAL/PLATELET
BASOS ABS: 0 10*3/uL (ref 0.0–0.1)
BASOS PCT: 0 % (ref 0–1)
Eosinophils Absolute: 0.1 10*3/uL (ref 0.0–0.7)
Eosinophils Relative: 1 % (ref 0–5)
HCT: 26.4 % — ABNORMAL LOW (ref 39.0–52.0)
HEMOGLOBIN: 9.3 g/dL — AB (ref 13.0–17.0)
LYMPHS PCT: 11 % — AB (ref 12–46)
Lymphs Abs: 0.8 10*3/uL (ref 0.7–4.0)
MCH: 31 pg (ref 26.0–34.0)
MCHC: 35.2 g/dL (ref 30.0–36.0)
MCV: 88 fL (ref 78.0–100.0)
MONO ABS: 0.5 10*3/uL (ref 0.1–1.0)
Monocytes Relative: 7 % (ref 3–12)
NEUTROS ABS: 6.1 10*3/uL (ref 1.7–7.7)
NEUTROS PCT: 81 % — AB (ref 43–77)
Platelets: 201 10*3/uL (ref 150–400)
RBC: 3 MIL/uL — ABNORMAL LOW (ref 4.22–5.81)
RDW: 14.4 % (ref 11.5–15.5)
WBC: 7.5 10*3/uL (ref 4.0–10.5)

## 2014-11-02 LAB — COMPREHENSIVE METABOLIC PANEL
ALT: 17 U/L (ref 0–53)
AST: 20 U/L (ref 0–37)
Albumin: 3.3 g/dL — ABNORMAL LOW (ref 3.5–5.2)
Alkaline Phosphatase: 52 U/L (ref 39–117)
Anion gap: 9 (ref 5–15)
BUN: 13 mg/dL (ref 6–23)
CALCIUM: 9 mg/dL (ref 8.4–10.5)
CO2: 26 mmol/L (ref 19–32)
Chloride: 101 mmol/L (ref 96–112)
Creatinine, Ser: 1.54 mg/dL — ABNORMAL HIGH (ref 0.50–1.35)
GFR calc Af Amer: 52 mL/min — ABNORMAL LOW (ref >90)
GFR calc non Af Amer: 45 mL/min — ABNORMAL LOW (ref >90)
Glucose, Bld: 315 mg/dL — ABNORMAL HIGH (ref 70–99)
POTASSIUM: 3.7 mmol/L (ref 3.5–5.1)
Sodium: 136 mmol/L (ref 135–145)
TOTAL PROTEIN: 6.1 g/dL (ref 6.0–8.3)
Total Bilirubin: 0.7 mg/dL (ref 0.3–1.2)

## 2014-11-02 LAB — URINALYSIS, ROUTINE W REFLEX MICROSCOPIC
BILIRUBIN URINE: NEGATIVE
Glucose, UA: >1000 mg/dL — AB
Ketones, ur: NEGATIVE mg/dL
Leukocytes, UA: NEGATIVE
NITRITE: NEGATIVE
PH: 6 (ref 5.0–8.0)
Protein, ur: 100 mg/dL — AB
Specific Gravity, Urine: 1.019 (ref 1.005–1.030)
UROBILINOGEN UA: 1 mg/dL (ref 0.0–1.0)

## 2014-11-02 LAB — URINE MICROSCOPIC-ADD ON

## 2014-11-02 LAB — I-STAT TROPONIN, ED: Troponin i, poc: 0.01 ng/mL (ref 0.00–0.08)

## 2014-11-02 LAB — LIPASE, BLOOD: LIPASE: 20 U/L (ref 11–59)

## 2014-11-02 MED ORDER — SODIUM CHLORIDE 0.9 % IV BOLUS (SEPSIS)
1000.0000 mL | Freq: Once | INTRAVENOUS | Status: AC
  Administered 2014-11-02: 1000 mL via INTRAVENOUS

## 2014-11-02 NOTE — ED Provider Notes (Signed)
CSN: [id number]     Arrival date & time 11/02/14  2100 History   First MD Initiated Contact with Patient 11/02/14 2111     Chief Complaint  Patient presents with  . Abdominal Pain     (Consider location/radiation/quality/duration/timing/severity/associated sxs/prior Treatment) HPI Comments: Cody Silva is a 69 y.o. 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 Accompanied with this episode was one episode of nausea and vomiting consisting of a brownish yellowish substance that he denies appeared like coffee ground emesis were hematemesis. His wife describes that he made a "squealing" noise and leaned over, but never completely passed out. Patient denies recalling this noise, but also denies loss of consciousness. He states that after this episode he had some mild epigastric discomfort which he reports is a 3/10 cramping periumbilical/epigastric pain which is nonradiating and only occurred after the nausea/vomiting and is now resolved. Denies any known aggravating or alleviating factors and did not take anything prior to arrival. He denies any fevers, chills, chest pain, shortness of breath, diarrhea, constipation, rectal pain/bleeding, hematochezia, melena, dysuria, hematuria, numbness, tingling, weakness, headache, vision changes, ongoing lightheadedness, or any head injury during this episode. He was recently admitted for symptomatic anemia and was given a colon cleanse before his last bowel movement was yesterday and consisted of the watery colon cleanse fluids, and he has not had a bowel movement since.  Patient is a  69 y.o. male presenting with abdominal pain. The history is provided by the patient and the spouse. No language interpreter was used.  Abdominal Pain Pain location:  Epigastric Pain quality: cramping   Pain radiates to:  Does not radiate Pain severity:  Mild Onset quality:  Gradual Duration:  1 hour Timing:  Rare Progression:  Resolved Chronicity:  New Relieved by:  None tried Worsened by:  Nothing tried Ineffective treatments:  None tried Associated symptoms: nausea and vomiting   Associated symptoms: no chest pain, no chills, no constipation, no diarrhea, no dysuria, no fever, no hematuria and no shortness of breath   Nausea:    Severity:  Mild   Onset quality:  Sudden   Duration:  1 hour   Timing:  Rare   Progression:  Resolved Vomiting:    Quality:  Stomach contents (brownish yellowish)   Number of occurrences:  1x   Severity:  Mild   Duration:  1 hour   Timing:  Sporadic   Progression:  Resolved Risk factors: recent hospitalization     Past Medical History  Diagnosis Date  . Hypertension   . Diabetic retinopathy   . Diabetic neuropathy   . Colon polyps   . History of blood transfusion 10/30/2014    hematochezia  . Anemia   . OSA on CPAP     "suppose to wear mask; I've got a call in for an equipment change" (10/30/2014)  . Type II diabetes mellitus   . Headache     "@ least 3 times/wk" (10/30/2014)  . Stroke 2014    left extremity deficits; facial left  . Arthritis     "right arm, right ankle, right side" (10/30/2014)  . History of gout   .  PTSD (post-traumatic stress disorder)     "service related"   Past Surgical History  Procedure Laterality Date  . Cataract extraction w/ intraocular lens  implant, bilateral Bilateral 2014-2015  . Tumor removal Right ~ 1976    "arm; had to take bone left hip to add to the repair"  . Bone graft hip iliac crest Left ~ 1976   Family History  Problem Relation Age of Onset  . Diabetes Mother   . Breast cancer Mother   .  Stroke Brother   . Neurofibromatosis Maternal Uncle    History  Substance Use Topics  . Smoking status: Never Smoker   . Smokeless tobacco: Never Used  . Alcohol Use: 0.0 oz/week    0 Standard drinks or equivalent per week     Comment: 10/30/2014 "might have a beer a couple times/yr"    Review of Systems  Constitutional: Positive for diaphoresis. Negative for fever and chills.  Respiratory: Negative for shortness of breath.   Cardiovascular: Negative for chest pain and leg swelling.  Gastrointestinal: Positive for nausea, vomiting and abdominal pain. Negative for diarrhea, constipation, blood in stool, anal bleeding and rectal pain.  Genitourinary: Negative for dysuria, hematuria and flank pain.  Musculoskeletal: Negative for myalgias, back pain, arthralgias, neck pain and neck stiffness.  Skin: Negative for color change.  Neurological: Positive for syncope (unsure, per pt's family) and light-headedness. Negative for dizziness, seizures, weakness, numbness and headaches.  Hematological: Bruises/bleeds easily.  Psychiatric/Behavioral: Negative for confusion.   10 Systems reviewed and are negative for acute change except as noted in the HPI.    Allergies  Metformin and related  Home Medications   Prior to Admission medications   Medication Sig Start Date End Date Taking? Authorizing Provider  acetaminophen (TYLENOL) 500 MG tablet Take 1 tablet (500 mg total) by mouth every 6 (six) hours as needed for mild pain, moderate pain, fever or headache. 11/01/14   Juluis Mire, MD  albuterol (PROVENTIL HFA;VENTOLIN HFA) 108 (90 BASE) MCG/ACT inhaler Inhale 2 puffs into the lungs every 6 (six) hours as needed for wheezing.    Historical Provider, MD  carvedilol (COREG) 25 MG tablet Take 25 mg by mouth 2 (two) times daily with a meal.    Historical Provider, MD  citalopram (CELEXA) 20 MG tablet Take 20 mg by mouth daily.    Historical Provider, MD  cloNIDine (CATAPRES - DOSED IN MG/24 HR)  0.3 mg/24hr Place 1 patch onto the skin once a week.    Historical Provider, MD  clopidogrel (PLAVIX) 75 MG tablet Take 1 tablet (75 mg total) by mouth at bedtime. 11/08/14   Juluis Mire, MD  fluocinonide cream (LIDEX) [id number] % Apply 1 application topically 2 (two) times daily.    Historical Provider, MD  fluticasone (FLONASE) 50 MCG/ACT nasal spray Place 2 sprays into the nose daily.    Historical Provider, MD  gabapentin (NEURONTIN) 300 MG capsule Take 300 mg by mouth daily as needed (leg pain).     Historical Provider, MD  insulin aspart (NOVOLOG) 100 UNIT/ML injection Inject 5-15 Units into the skin 3 (three) times daily with meals.     Historical Provider, MD  insulin glargine (LANTUS) 100 UNIT/ML injection Inject 30-49 Units into the skin at bedtime.     Historical Provider, MD  latanoprost (XALATAN) 0.005 % ophthalmic solution Place 1 drop into both eyes at bedtime.     Historical Provider, MD  losartan (COZAAR) 100 MG tablet Take 100 mg by mouth daily.  Historical Provider, MD  Multiple Vitamins-Minerals (MULTIVITAMIN PO) Take 1 tablet by mouth daily.    Historical Provider, MD  polyvinyl alcohol (LIQUIFILM TEARS) 1.4 % ophthalmic solution Place 1 drop into both eyes 5 (five) times daily as needed (for dry eyes).    Historical Provider, MD  simvastatin (ZOCOR) 40 MG tablet Take 40 mg by mouth every evening.    Historical Provider, MD  Skin Protectants, Misc. (EUCERIN) cream Apply 1 application topically daily.     Historical Provider, MD  spironolactone (ALDACTONE) 25 MG tablet Take 25 mg by mouth daily.    Historical Provider, MD   BP 181/84 mmHg  Pulse 77  Temp(Src) 98.3 F (36.8 C) (Oral)  Resp 16  Ht 6\' 4"  (1.93 m)  Wt 225 lb (102.059 kg)  BMI 27.40 kg/m2  SpO2 100% Physical Exam  Constitutional: He is oriented to person, place, and time. He appears well-developed and well-nourished.  Non-toxic appearance. No distress.  Afebrile, nontoxic, NAD. HTN noted, which is baseline for  pt  HENT:  Head: Normocephalic and atraumatic.  Mouth/Throat: Uvula is midline, oropharynx is clear and moist and mucous membranes are normal.  Eyes: Conjunctivae and EOM are normal. Pupils are equal, round, and reactive to light. Right eye exhibits no discharge. Left eye exhibits no discharge.  Neck: Normal range of motion. Neck supple.  Cardiovascular: Normal rate, regular rhythm, normal heart sounds and intact distal pulses.  Exam reveals no gallop and no friction rub.   No murmur heard. RRR, nl s1/s2, no m/r/g, distal pulses intact, no pedal edema   Pulmonary/Chest: Effort normal and breath sounds normal. No respiratory distress. He has no decreased breath sounds. He has no wheezes. He has no rhonchi. He has no rales.  Abdominal: Soft. Normal appearance and bowel sounds are normal. He exhibits no distension. There is tenderness in the epigastric area. There is no rigidity, no rebound, no guarding, no CVA tenderness, no tenderness at McBurney's point and negative Murphy's sign.    Soft, ND, +BS throughout, very mild discomfort in epigastrum, no r/g/r, neg murphy's, neg mcburney's, no CVA TTP   Musculoskeletal: Normal range of motion.  MAE x4 Strength 5/5 and sensation grossly intact in all extremities Distal pulses intact Neg homan's bilaterally, no pedal edema  Neurological: He is alert and oriented to person, place, and time. He has normal strength. No cranial nerve deficit or sensory deficit. He displays a negative Romberg sign. Coordination normal. GCS eye subscore is 4. GCS verbal subscore is 5. GCS motor subscore is 6.  CN 2-12 grossly intact A&O x4 GCS 15 Sensation and strength intact Coordination with finger-to-nose WNL Neg romberg, neg pronator drift   Skin: Skin is warm, dry and intact. No rash noted.  Psychiatric: He has a normal mood and affect.  Nursing note and vitals reviewed.   ED Course  Procedures (including critical care time)  22:00 Orthostatic Vital Signs HR    Orthostatic Lying  - BP- Lying: 174/84 mmHg ; Pulse- Lying: 77  Orthostatic Sitting - BP- Sitting: 186/85 mmHg ; Pulse- Sitting: 77  Orthostatic Standing at 0 minutes - BP- Standing at 0 minutes: 164/77 mmHg ; Pulse- Standing at 0 minutes: 86       Labs Review Labs Reviewed  CBC WITH DIFFERENTIAL/PLATELET - Abnormal; Notable for the following:    RBC 3.00 (*)    Hemoglobin 9.3 (*)    HCT 26.4 (*)    Neutrophils Relative % 81 (*)    Lymphocytes Relative 11 (*)  All other components within normal limits  COMPREHENSIVE METABOLIC PANEL - Abnormal; Notable for the following:    Glucose, Bld 315 (*)    Creatinine, Ser 1.54 (*)    Albumin 3.3 (*)    GFR calc non Af Amer 45 (*)    GFR calc Af Amer 52 (*)    All other components within normal limits  URINALYSIS, ROUTINE W REFLEX MICROSCOPIC - Abnormal; Notable for the following:    Glucose, UA >1000 (*)    Hgb urine dipstick SMALL (*)    Protein, ur 100 (*)    All other components within normal limits  URINE MICROSCOPIC-ADD ON - Abnormal; Notable for the following:    Casts HYALINE CASTS (*)    All other components within normal limits  LIPASE, BLOOD  I-STAT TROPOININ, ED    Imaging Review No results found.   EKG Interpretation   Date/Time:  Monday November 02 2014 21:25:14 EST Ventricular Rate:  77 PR Interval:  176 QRS Duration: 104 QT Interval:  402 QTC Calculation: 455 R Axis:   36 Text Interpretation:  Sinus rhythm No significant change since last  tracing Confirmed by [facility]  MD, MARTHA [phone]) on 11/02/2014 9:38:43 PM      MDM   Final diagnoses:  Orthostasis  Vasovagal near syncope  AKI (acute kidney injury)  Anemia, unspecified anemia type  Hyperglycemia due to type 2 diabetes mellitus  Epigastric abdominal pain  Non-intractable vomiting with nausea, vomiting of unspecified type    69 y.o. male with episode of dizziness, diaphoresis, n/v of brownish yellowish material, and presyncope witnessed by  wife before he ate crab legs for dinner. Now feels fine, abd discomfort reported in epigastrum, states it's mild and improving, exam reveals very slight tenderness that he states is "discomfort". Otherwise he feels fine. Recently discharged for symptomatic anemia after GI bleed. Will check basic labs and reassess. Will hold on giving meds since pt has had resolution of symptoms. Will get EKG as well. Will reassess shortly.   10:56 PM Trop neg. EKG WNL. CBC w/diff showing stable anemia with hgb 9.3 (better than discharge value). CMP showing gluc 315 and Cr slightly elevated from baseline at 1.54. No anion gap or bicarb changes, will obtain U/A to eval for ketones to ensure no DKA. Lipase WNL. Orthostatic VS showing slight orthostasis, will give fluids.   12:04 AM U/A without ketones. Pt stable and continues to feel well without ongoing symptoms. Likely orthostasis vs vasovagal near syncope vs hyperglycemia induced symptoms. Will have him use OTC tums/zantac/prilosec as needed for nausea/epigastric discomfort, tolerating PO well here therefore discussed good hydration. Will have him f/up with PCP this week. I explained the diagnosis and have given explicit precautions to return to the ER including for any other new or worsening symptoms. The patient understands and accepts the medical plan as it's been dictated and I have answered their questions. Discharge instructions concerning home care and prescriptions have been given. The patient is STABLE and is discharged to home in good condition.  BP 179/90 mmHg  Pulse 74  Temp(Src) 98.3 F (36.8 C) (Oral)  Resp 16  Ht 6\' 4"  (1.93 m)  Wt 225 lb (102.059 kg)  BMI 27.40 kg/m2  SpO2 100%  Meds ordered this encounter  Medications  . sodium chloride 0.9 % bolus 1,000 mL    Sig:      Patty Sermons [place], PA-C 11/03/14 0016  Threasa Beards, MD 11/03/14 [phone]

## 2014-11-02 NOTE — ED Notes (Signed)
Pt. Was discharged from [facility] yesterday for rectal bleeding. Today pt. And wife went to a new [employer] and pt. Began feeling weak, dizzy, nauseated and diaphoretic. Pt. Is c/o mid upper abdominal pain that's a 2/10

## 2014-11-03 NOTE — Discharge Instructions (Signed)
Take your normal home medications. Check your blood sugar often and take your insulin as directed. Stay well hydrated. Use tylenol as needed for pain, or use over the counter prilosec/zantac/tums as needed for indigestion or nausea. See your regular doctor in 3-5 days for recheck. Return to the ER for changes or worsening symptoms.   Near-Syncope Near-syncope (commonly known as near fainting) is sudden weakness, dizziness, or feeling like you might pass out. This can happen when getting up or while standing for a long time. It is caused by a sudden decrease in blood flow to the brain, which can occur for various reasons. Most of the reasons are not serious.  HOME CARE Watch your condition for any changes.  Have someone stay with you until you feel stable.  If you feel like you are going to pass out:  Lie down right away.  Prop your feet up if you can.  Breathe deeply and steadily.  Move only when the feeling has gone away. Most of the time, this feeling lasts only a few minutes. You may feel tired for several hours.  Drink enough fluids to keep your pee (urine) clear or pale yellow.  If you are taking blood pressure or heart medicine, stand up slowly.  Follow up with your doctor as told. GET HELP RIGHT AWAY IF:   You have a severe headache.  You have unusual pain in the chest, belly (abdomen), or back.  You have bleeding from the mouth or butt (rectum), or you have black or tarry poop (stool).  You feel your heart beat differently than normal, or you have a very fast pulse.  You pass out, or you twitch and shake when you pass out.  You pass out when sitting or lying down.  You feel confused.  You have trouble walking.  You are weak.  You have vision problems. MAKE SURE YOU:   Understand these instructions.  Will watch your condition.  Will get help right away if you are not doing well or get worse. Document Released: 02/07/2008 Document Revised: 08/26/2013  Document Reviewed: 01/24/2013 [facility] Patient Information 2015 [place], [place]. This information is not intended to replace advice given to you by your health care provider. Make sure you discuss any questions you have with your health care provider.  Orthostatic Hypotension Orthostatic hypotension is a sudden drop in blood pressure. It happens when you quickly stand up from a seated or lying position. You may feel dizzy or light-headed. This can last for just a few seconds or for up to a few minutes. It is usually not a serious problem. However, if this happens frequently or gets worse, it can be a sign of something more serious. CAUSES  Different things can cause orthostatic hypotension, including:   Loss of body fluids (dehydration).  Medicines that lower blood pressure.  Sudden changes in posture, such as standing up quickly after you have been sitting or lying down.  Taking too much of your medicine. SIGNS AND SYMPTOMS   Light-headedness or dizziness.   Fainting or near-fainting.   A fast heart rate.   Weakness.   Feeling tired (fatigue).  DIAGNOSIS  Your health care provider may do several things to help diagnose your condition and identify the cause. These may include:   Taking a medical history and doing a physical exam.  Checking your blood pressure. Your health care provider will check your blood pressure when you are:  Lying down.  Sitting.  Standing.  Using tilt table  testing. In this test, you lie down on a table that moves from a lying position to a standing position. You will be strapped onto the table. This test monitors your blood pressure and heart rate when you are in different positions. TREATMENT  Treatment will vary depending on the cause. Possible treatments include:   Changing the dosage of your medicines.  Wearing compression stockings on your lower legs.  Standing up slowly after sitting or lying down.  Eating more salt.  Eating  frequent, small meals.  In some cases, getting IV fluids.  Taking medicine to enhance fluid retention. HOME CARE INSTRUCTIONS  Only take over-the-counter or prescription medicines as directed by your health care provider.  Follow your health care provider's instructions for changing the dosage of your current medicines.  Do not stop or adjust your medicine on your own.  Stand up slowly after sitting or lying down. This allows your body to adjust to the different position.  Wear compression stockings as directed.  Eat extra salt as directed.  Do not add extra salt to your diet unless directed to by your health care provider.  Eat frequent, small meals.  Avoid standing suddenly after eating.  Avoid hot showers or excessive heat as directed by your health care provider.  Keep all follow-up appointments. SEEK MEDICAL CARE IF:  You continue to feel dizzy or light-headed after standing.  You feel groggy or confused.  You feel cold, clammy, or sick to your stomach (nauseous).  You have blurred vision.  You feel short of breath. SEEK IMMEDIATE MEDICAL CARE IF:   You faint after standing.  You have chest pain.  You have difficulty breathing.   You lose feeling or movement in your arms or legs.   You have slurred speech or difficulty talking, or you are unable to talk.  MAKE SURE YOU:   Understand these instructions.  Will watch your condition.  Will get help right away if you are not doing well or get worse. Document Released: 08/11/2002 Document Revised: 08/26/2013 Document Reviewed: 06/13/2013 [facility] Patient Information 2015 [place], [place]. This information is not intended to replace advice given to you by your health care provider. Make sure you discuss any questions you have with your health care provider.  Type 2 Diabetes Mellitus Type 2 diabetes mellitus is a long-term (chronic) disease. In type 2 diabetes:  The pancreas does not make enough of a  hormone called insulin.  The cells in the body do not respond as well to the insulin that is made.  Both of the above can happen. Normally, insulin moves sugars from food into tissue cells. This gives you energy. If you have type 2 diabetes, sugars cannot be moved into tissue cells. This causes high blood sugar (hyperglycemia).  HOME CARE  Have your hemoglobin A1c level checked twice a year. The level shows if your diabetes is under control or out of control.  Test your blood sugar level every day as told by your doctor.  Check your ketone levels by testing your pee (urine) when you are sick and as told.  Take your diabetes or insulin medicine as told by your doctor.  Never run out of insulin.  Adjust how much insulin you give yourself based on how many carbs (carbohydrates) you eat. Carbs are in many foods, such as fruits, vegetables, whole grains, and dairy products.  Have a healthy snack between every healthy meal. Have 3 meals and 3 snacks a day.  Lose weight if you  are overweight.  Carry a medical alert card or wear your medical alert jewelry.  Carry a 15-gram carb snack with you at all times. Examples include:  Glucose pills, 3 or 4.  Glucose gel, 15-gram tube.  Raisins, 2 tablespoons (24 grams).  Jelly beans, 6.  Animal crackers, 8.  Regular (not diet) pop, 4 ounces (120 milliliters).  Gummy treats, 9.  Notice low blood sugar (hypoglycemia) symptoms, such as:  Shaking (tremors).  Trouble thinking clearly.  Sweating.  Faster heart rate.  Headache.  Dry mouth.  Hunger.  Crabbiness (irritability).  Being worried or tense (anxious).  Restless sleep.  A change in speech or coordination.  Confusion.  Treat low blood sugar right away. If you are alert and can swallow, follow the 15:15 rule:  Take 15-20 grams of a rapid-acting glucose or carb. This includes glucose gel, glucose pills, or 4 ounces (120 milliliters) of fruit juice, regular pop, or  low-fat milk.  Check your blood sugar level 15 minutes after taking the glucose.  Take 15-20 grams more of glucose if the repeat blood sugar level is still 70 mg/dL (milligrams/deciliter) or below.  Eat a meal or snack within 1 hour of the blood sugar levels going back to normal.  Notice early symptoms of high blood sugar, such as:  Being really thirsty or drinking a lot (polydipsia).  Peeing a lot (polyuria).  Do at least 150 minutes of physical activity a week or as told.  Split the 150 minutes of activity up during the week. Do not do 150 minutes of activity in one day.  Perform exercises, such as weight lifting, at least 2 times a week or as told.  Spend no more than 90 minutes at one time inactive.  Adjust your insulin or food intake as needed if you start a new exercise or sport.  Follow your sick-day plan when you are not able to eat or drink as usual.  Do not smoke, chew tobacco, or use electronic cigarettes.  Women who are not pregnant should drink no more than 1 drink a day. Men should drink no more than 2 drinks a day.  Only drink alcohol with food.  Ask your doctor if alcohol is safe for you.  Tell your doctor if you drink alcohol several times during the week.  See your doctor regularly.  Schedule an eye exam soon after you are told you have diabetes. Schedule exams once every year.  Check your skin and feet every day. Check for cuts, bruises, redness, nail problems, bleeding, blisters, or sores. A doctor should do a foot exam once a year.  Brush your teeth and gums twice a day. Floss once a day. Visit your dentist regularly.  Share your diabetes plan with your workplace or school.  Stay up-to-date with shots that fight against diseases (immunizations).  Learn how to deal with stress.  Get diabetes education and support as needed.  Ask your doctor for special help if:  You need help to maintain or improve how you do things on your own.  You need  help to maintain or improve the quality of your life.  You have foot or hand problems.  You have trouble cleaning yourself, dressing, eating, or doing physical activity. GET HELP IF:  You are unable to eat or drink for more than 6 hours.  You feel sick to your stomach (nauseous) or throw up (vomit) for more than 6 hours.  Your blood sugar level is over 240 mg/dL.  There  is a change in mental status.  You get another serious illness.  You have watery poop (diarrhea) for more than 6 hours.  You have been sick or have had a fever for 2 or more days and are not getting better.  You have pain when you are active. GET HELP RIGHT AWAY IF:  You have trouble breathing.  Your ketone levels are higher than your doctor says they should be. MAKE SURE YOU:  Understand these instructions.  Will watch your condition.  Will get help right away if you are not doing well or get worse. Document Released: 05/30/2008 Document Revised: 01/05/2014 Document Reviewed: 03/22/2012 [facility] Patient Information 2015 [place], [place]. This information is not intended to replace advice given to you by your health care provider. Make sure you discuss any questions you have with your health care provider.  Diabetes, Eating Away From Home Sometimes, you might eat in a restaurant or have meals that are prepared by someone else. You can enjoy eating out. However, the portions in restaurants may be much larger than needed. Listed below are some ideas to help you choose foods that will keep your blood glucose (sugar) in better control.  TIPS FOR EATING OUT  Know your meal plan and how many carbohydrate servings you should have at each meal. You may wish to carry a copy of your meal plan in your purse or wallet. Learn the foods included in each food group.  Make a list of restaurants near you that offer healthy choices. Take a copy of the carry-out menus to see what they offer. Then, you can plan what you will  order ahead of time.  Become familiar with serving sizes by practicing them at home using measuring cups and spoons. Once you learn to recognize portion sizes, you will be able to correctly estimate the amount of total carbohydrate you are allowed to eat at the restaurant. Ask for a takeout box if the portion is more than you should have. When your food comes, leave the amount you should have on the plate, and put the rest in the takeout box before you start eating.  Plan ahead if your mealtime will be different from usual. Check with your caregiver to find out how to time meals and medicine if you are taking insulin.  Avoid high-fat foods, such as fried foods, cream sauces, high-fat salad dressings, or any added butter or margarine.  Do not be afraid to ask questions. Ask your server about the portion size, cooking methods, ingredients and if items can be substituted. Restaurants do not list all available items on the menu. You can ask for your main entree to be prepared using skim milk, oil instead of butter or margarine, and without gravy or sauces. Ask your waiter or waitress to serve salad dressings, gravy, sauces, margarine, and sour cream on the side. You can then add the amount your meal plan suggests.  Add more vegetables whenever possible.  Avoid items that are labeled "jumbo," "giant," "deluxe," or "supersized."  You may want to split an entre with someone and order an extra side salad.  Watch for hidden calories in foods like croutons, bacon, or cheese.  Ask your server to take away the bread basket or chips from your table.  Order a dinner salad as an appetizer. You can eat most foods served in a restaurant. Some foods are better choices than others. Breads and Starches  Recommended: All kinds of bread (wheat, rye, white, oatmeal, [place], [place], raisin),  hard or soft dinner rolls, frankfurter or hamburger buns, small bagels, small corn or whole-wheat flour tortillas.  Avoid:  Frosted or glazed breads, butter rolls, egg or cheese breads, croissants, sweet rolls, pastries, coffee cake, glazed or frosted doughnuts, muffins. Crackers  Recommended: Animal crackers, graham, rye, saltine, oyster, and matzoth crackers. Bread sticks, melba toast, rusks, pretzels, popcorn (without fat), zwieback toast.  Avoid: High-fat snack crackers or chips. Buttered popcorn. Cereals  Recommended: Hot and cold cereals. Whole grains such as oatmeal or shredded wheat are good choices.  Avoid: Sugar-coated or granola type cereals. Potatoes/Pasta/Rice/Beans  Recommended: Order baked, boiled, or mashed potatoes, rice or noodles without added fat, whole beans. Order gravies, butter, margarine, or sauces on the side so you can control the amount you add.  Avoid: Hash browns or fried potatoes. Potatoes, pasta, or rice prepared with cream or cheese sauce. Potato or pasta salads prepared with large amounts of dressing. Fried beans or fried rice. Vegetables  Recommended: Order steamed, baked, boiled, or stewed vegetables without sauces or extra fat. Ask that sauce be served on the side. If vegetables are not listed on the menu, ask what is available.  Avoid: Vegetables prepared with cream, butter, or cheese sauce. Fried vegetables. Salad Bars  Recommended: Many of the vegetables at a salad bar are considered "free." Use lemon juice, vinegar, or low-calorie salad dressing (fewer than 20 calories per serving) as "free" dressings for your salad. Look for salad bar ingredients that have no added fat or sugar such as tomatoes, lettuce, cucumbers, broccoli, carrots, onions, and mushrooms.  Avoid: Prepared salads with large amounts of dressing, such as coleslaw, caesar salad, macaroni salad, bean salad, or carrot salad. Fruit  Recommended: Eat fresh fruit or fresh fruit salad without added dressing. A salad bar often offers fresh fruit choices, but canned fruit at a restaurant is usually packed in  sugar or syrup.  Avoid: Sweetened canned or frozen fruits, plain or sweetened fruit juice. Fruit salads with dressing, sour cream, or sugar added to them. Meat and Meat Substitutes  Recommended: Order broiled, baked, roasted, or grilled meat, poultry, or fish. Trim off all visible fat. Do not eat the skin of poultry. The size stated on the menu is the raw weight. Meat shrinks by  in cooking (for example, 4 oz raw equals 3 oz cooked meat).  Avoid: Deep-fat fried meat, poultry, or fish. Breaded meats. Eggs  Recommended: Order soft, hard-cooked, poached, or scrambled eggs. Omelets may be okay, depending on what ingredients are added. Egg substitutes are also a good choice.  Avoid: Fried eggs, eggs prepared with cream or cheese sauce. Milk  Recommended: Order low-fat or fat-free milk according to your meal plan. Plain, nonfat yogurt or flavored yogurt with no sugar added may be used as a substitute for milk. Soy milk may also be used.  Avoid: Milk shakes or sweetened milk beverages. Soups and Combination Foods  Recommended: Clear broth or consomm are "free" foods and may be used as an appetizer. Broth-based soups with fat removed count as a starch serving and are preferred over cream soups. Soups made with beans or split peas may be eaten but count as a starch.  Avoid: Fatty soups, soup made with cream, cheese soup. Combination foods prepared with excessive amounts of fat or with cream or cheese sauces. Desserts and Sweets  Recommended: Ask for fresh fruit. Sponge or angel food cake without icing, ice milk, no sugar added ice cream, sherbet, or frozen yogurt may fit into your  meal plan occasionally.  Avoid: Pastries, puddings, pies, cakes with icing, custard, gelatin desserts. Fats and Oils  Recommended: Choose healthy fats such as olive oil, canola oil, or tub margarine, reduced fat or fat-free sour cream, cream cheese, avocado, or nuts.  Avoid: Any fats in excess of your allowed  portion. Deep-fried foods or any food with a large amount of fat. Note: Ask for all fats to be served on the side, and limit your portion sizes according to your meal plan. Document Released: 08/21/2005 Document Revised: 11/13/2011 Document Reviewed: 11/18/2013 [facility] Patient Information 2015 [place], [place]. This information is not intended to replace advice given to you by your health care provider. Make sure you discuss any questions you have with your health care provider.  Iron Deficiency Anemia Anemia is when you have a low number of healthy red blood cells. It is often caused by too little iron. This is called iron deficiency anemia. It may make you tired and short of breath. HOME CARE   Take iron as told by your doctor.  Take vitamins as told by your doctor.  Eat foods that have iron in them. This includes liver, lean beef, whole-grain bread, eggs, dried fruit, and dark green leafy vegetables. GET HELP RIGHT AWAY IF:  You pass out (faint).  You have chest pain.  You feel sick to your stomach (nauseous) or throw up (vomit).  You get very short of breath with activity.  You are weak.  You have a fast heartbeat.  You start to sweat for no reason.  You become light-headed when getting up from a chair or bed. MAKE SURE YOU:  Understand these instructions.  Will watch your condition.  Will get help right away if you are not doing well or get worse. Document Released: 09/23/2010 Document Revised: 08/26/2013 Document Reviewed: 04/28/2013 [facility] Patient Information 2015 [place], [place]. This information is not intended to replace advice given to you by your health care provider. Make sure you discuss any questions you have with your health care provider.

## 2014-11-03 NOTE — ED Notes (Addendum)
Pt. Left with all belongings 

## 2014-12-26 NOTE — Op Note (Signed)
PATIENT NAME:  Cody Silva, Cody Silva MR#:  [id number] DATE OF BIRTH:  [birth year]  DATE OF PROCEDURE:  04/07/2014  PREOPERATIVE DIAGNOSIS: Visually significant cataract of the right eye.   POSTOPERATIVE DIAGNOSIS: Visually significant cataract of the right eye.   OPERATIVE PROCEDURE: Cataract extraction by phacoemulsification with implant of intraocular lens to right eye.   SURGEON: Cody Robson, MD.   ANESTHESIA:  1. Managed anesthesia care.  2. Topical tetracaine drops followed by 2% Xylocaine jelly applied in the preoperative holding area.   COMPLICATIONS: None.   TECHNIQUE:  Stop and chop.  DESCRIPTION OF PROCEDURE: The patient was examined and consented in the preoperative holding area where the aforementioned topical anesthesia was applied to the right eye and then brought back to the operating room where the right eye was prepped and draped in the usual sterile ophthalmic fashion and a lid speculum was placed. A paracentesis was created with the side port blade and the anterior chamber was filled with viscoelastic. A near clear corneal incision was performed with the steel keratome. A continuous curvilinear capsulorrhexis was performed with a cystotome followed by the capsulorrhexis forceps. Hydrodissection and hydrodelineation were carried out with BSS on a blunt cannula. The lens was removed in a stop and chop technique and the remaining cortical material was removed with the irrigation-aspiration handpiece. The capsular bag was inflated with viscoelastic and the Tecnis ZCB00 13.0-diopter lens, serial number [id number] was placed in the capsular bag without complication. The remaining viscoelastic was removed from the eye with the irrigation-aspiration handpiece. The wounds were hydrated. The anterior chamber was flushed with Miostat and the eye was inflated to physiologic pressure. 0.1 mL of cefuroxime concentration 10 mg/mL was placed in the anterior chamber. The wounds were found to be  water tight. The eye was dressed with Vigamox. The patient was given protective glasses to wear throughout the day and a shield with which to sleep tonight. The patient was also given drops with which to begin a drop regimen today and will follow-up with me in 1 day.    ____________________________ Livingston Diones. Tiauna Whisnant, MD wlp:[id number] D: 04/07/2014 [id number] ET T: 04/08/2014 05:00:23 ET JOB#: [id number]  cc: Ashanta Amoroso L. Waver Dibiasio, MD, <Dictator> Livingston Diones Noble Bodie MD ELECTRONICALLY SIGNED 04/08/2014 16:34

## 2015-03-22 ENCOUNTER — Ambulatory Visit (INDEPENDENT_AMBULATORY_CARE_PROVIDER_SITE_OTHER): Payer: Medicare Other | Admitting: Neurology

## 2015-03-22 ENCOUNTER — Encounter: Payer: Self-pay | Admitting: Neurology

## 2015-03-22 VITALS — BP 147/81 | HR 66 | Ht 76.5 in | Wt 237.5 lb

## 2015-03-22 DIAGNOSIS — I6990 Unspecified sequelae of unspecified cerebrovascular disease: Secondary | ICD-10-CM | POA: Diagnosis not present

## 2015-03-22 NOTE — Progress Notes (Signed)
PATIENT: Cody Silva DOB: 02-Nov-1945  REASON FOR VISIT: routine follow up for stroke HISTORY FROM: patient  HISTORY OF PRESENT ILLNESS: 69 year male seen for first office f/u visit after [facility] admission on 02/20/2013.he presented with 3 month h/o intermittent slurred speech, leg weakness and 1 episode of syncope.he was evaluated at the [facility] and had several head CTs which were unremarkable.he had 2 episodes of falling on day of admission and MRI brain showed a large 4 x 4 cm diffusion positive lesion in corpus callosum involving right more than left cingulate guyrus with only faint enhancement. It was unclear wether this was a callosal branch anterior cerebral artery infarct or a mass lesion.MRA showed decrease flow in median artery of corpus callosum.Carotid dopplers and 2DEcho showed normal ejection fraction. EEG was normal. Lipid profile was normal. HbA1c was elevated at 7.5% He is tolerating plavix well without bleeding or side effects.He states his gait and balance have improved and he is walking well. He notices some imbalance when he is in a hurry or tired but is able to catch himself without falling. He has sleep apnea but is not using his CPAP mask daily.  UPDATE 09/12/13 (LL): Cody Silva returns to office for stroke revisit. He states he has been doing well, having some headaches but not too often. He complains that his memory is not as well as before the stroke. His wife does not notice any problem with his memory. His follow up MRI showed expected evolutionary changes, no mention of mass lesion. He is tolerating Plavix well but has stopped currently to have a tooth extracted early next week. His BP is elevated in office today, at 166/92, but he says it is usually lower.   UPDATE 03/12/14 (LL): Since last visit, he has not had any new neurovascular symptoms. He has started to have more difficulties with imbalance. 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 His  blood pressure is elevated in the office today, 188/90.  He states it is not that high when he takes it at home. He is having progressively more acting out dreams, but his wife states that he does not get out of bed.  He has PTSD from being in the [place] War, and has had treatment through the [place]. He does not wear his cpap consistently. His blood sugars in the morning range from 130-170, but he has had some problems with lows as well.  He is tolerating Plavix well with no signs of significant bleeding or bruising. Update 09/21/2014 : He returns for follow-up after last visit 6 months ago. She continues to have poor balance. He did go for outpatient physical therapy following the last visit but it did not seem to help a lot. The patient's wife states that he had a transient episode of facial droop as well as increased imbalance about a month ago but they did not seek medical help at that time. The patient continues not to use his CPAP regularly but he does plan to go to the [place] and get his recalibrated so it fits better. He remains on Plavix which is tolerating well but plans to get an elective dental procedure and wants to stop it for 3 days prior to the procedure. He states his fasting sugars have been quite good and last hemoglobin A1c was 7.0  3 months ago. His blood pressure has been well controlled. His last lipid profile was also fine. He has not  had a carotid Doppler done since he left the hospital a year and a half ago.  Update 03/22/2015 : He returns for follow-up after last visit 6 months ago. He was admitted in February 2016 twice to the hospital initially with episodes of GI bleed and subsequently with anemia and generalized weakness. He see blood transfusion. He'll stop Plavix for 7 days but GI workup did not reveal a source of bleeding. Plavix has been restarted and is tolerating it well. He however does still complain of generalized weakness and at times feels he is weak and cannot get out of a chair  and has trouble climbing steps. He has not had any recent lab work done but he he plans to see his primary care physician at the [place] but is not happy with the care and plans for Transfer to a new physician in [place]. He still continues to have occasional speech difficulties and continues to have mild balance difficulties though he is not had any falls. REVIEW OF SYSTEMS: Full 14 system review of systems performed and notable only for:   14 system review of systems is positive for  fever, cold intolerance, daytime sleepiness, snoring, walking difficulty, headache, weakness, agitation, behavioral problem, confusion and all other systems negative   ALLERGIES: Allergies  Allergen Reactions  . Metformin And Related Diarrhea    HOME MEDICATIONS: Outpatient Prescriptions Prior to Visit  Medication Sig Dispense Refill  . acetaminophen (TYLENOL) 500 MG tablet Take 1 tablet (500 mg total) by mouth every 6 (six) hours as needed for mild pain, moderate pain, fever or headache. 30 tablet 0  . albuterol (PROVENTIL HFA;VENTOLIN HFA) 108 (90 BASE) MCG/ACT inhaler Inhale 2 puffs into the lungs every 6 (six) hours as needed for wheezing.    . carvedilol (COREG) 25 MG tablet Take 25 mg by mouth 2 (two) times daily with a meal.    . citalopram (CELEXA) 20 MG tablet Take 20 mg by mouth daily.    . cloNIDine (CATAPRES - DOSED IN MG/24 HR) 0.3 mg/24hr Place 1 patch onto the skin once a week.    . clopidogrel (PLAVIX) 75 MG tablet Take 1 tablet (75 mg total) by mouth at bedtime.    . fluocinonide cream (LIDEX) [id number] % Apply 1 application topically 2 (two) times daily.    . fluticasone (FLONASE) 50 MCG/ACT nasal spray Place 2 sprays into the nose daily.    [name] gabapentin (NEURONTIN) 300 MG capsule Take 300 mg by mouth daily as needed (leg pain).     . insulin aspart (NOVOLOG) 100 UNIT/ML injection Inject 5-15 Units into the skin 3 (three) times daily with meals.     . insulin glargine (LANTUS) 100 UNIT/ML injection  Inject 30-49 Units into the skin at bedtime.     [name] latanoprost (XALATAN) 0.005 % ophthalmic solution Place 1 drop into both eyes at bedtime.     [name] losartan (COZAAR) 100 MG tablet Take 100 mg by mouth daily.    . Multiple Vitamins-Minerals (MULTIVITAMIN PO) Take 1 tablet by mouth daily.    . polyvinyl alcohol (LIQUIFILM TEARS) 1.4 % ophthalmic solution Place 1 drop into both eyes 5 (five) times daily as needed (for dry eyes).    . simvastatin (ZOCOR) 40 MG tablet Take 40 mg by mouth every evening.    . Skin Protectants, Misc. (EUCERIN) cream Apply 1 application topically daily.     [name] spironolactone (ALDACTONE) 25 MG tablet Take 25 mg by mouth daily.     No  facility-administered medications prior to visit.    PHYSICAL EXAM Filed Vitals:   03/22/15 1107  BP: 147/81  Pulse: 66  Height: 6' 4.5" (1.943 m)  Weight: 237 lb 8 oz (107.729 kg)   Body mass index is 28.54 kg/(m^2). No exam data present No flowsheet data found.  No flowsheet data found.   General: well developed, well nourished, african [place] male seated, in no evident distress  Head: head normocephalic and atraumatic.   benign  Neck: supple with no carotid or supraclavicular bruits  Cardiovascular: regular rate and rhythm, no murmurs  Musculoskeletal: no deformity  Skin: no rash/petichiae  Vascular: Normal pulses all extremities   Neurologic Exam  Mental Status: Awake and fully alert. Oriented to place and time.  Attention span, concentration and fund of knowledge appropriate. Mood and affect appropriate. Mini-Mental status exam scored 28/30 with deficits in attention and calculation only. Animal naming test 12. Cranial Nerves: Pupils equal, briskly reactive to light. Extraocular movements full without nystagmus. Visual fields full to confrontation. Hearing intact. Facial sensation intact. Face, tongue, palate moves normally and symmetrically.  Motor: Normal bulk and tone. Normal strength in all tested extremity  muscles.diminished fine finger movements on left and orbits right over left upper extremity.  Sensory.: intact to tough and pinprick and vibratory.  Coordination: Rapid alternating movements normal in all extremities. Finger-to-nose and heel-to-shin performed accurately bilaterally.  Gait and Station: Arises from chair without difficulty. Stance is normal. Gait demonstrates normal stride length, with some stiffness of the right leg.  Poor arm swing on the left. Unable to heel, toe walk without difficulty. Tandem unsteady. Reflexes: 1+ and symmetric. Toes downgoing.   07/17/13 Mri brain without  Abnormal MRI scan the brain showing multiple nonspecific periventricular and subcortical nonspecific white matter hyperintensities with a differential discussed above. No enhancing lesions are noted. Compared with MRI scan dated 02/20/2013 the previously described cortical acute infarct shows expected evolutionary changes.   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 Vascular risk factors of HTN, Sleep Apnea and Diabetes.  [name]   PLAN:   I had a long d/w patient and wife  about his remote stroke, risk for recurrent stroke/TIAs, personally independently reviewed imaging studies and stroke evaluation results and answered questions.Continue Plavix  for secondary stroke prevention and maintain strict control of hypertension with blood pressure goal below 130/90, diabetes with hemoglobin A1c goal below 6.5% and lipids with LDL cholesterol goal below 100 mg/dL. I also advised the patient to eat a healthy diet with plenty of whole grains, cereals, fruits and vegetables, exercise regularly and maintain ideal body weight .I advised the patient to see his primary care physician to discuss his complaints of generalized weakness, fatigability which may be related to anemia or recurrence of his GI bleeding.  Greater than 50% of this 25 minute visit was spent in counseling and coordination of care. Followup in the future with me in one year or call earlier if necessary.  Antony Contras, MD  No orders of the defined types were placed in this encounter.   Return in about 1 year (around 03/21/2016).  Antony Contras, MD  03/22/2015, 6:18 PM [facility] [address], [place], [place] [address] [phone]  Note: This document was prepared with digital dictation and possible smart phrase technology. Any transcriptional errors that result from this process are unintentional.

## 2015-03-22 NOTE — Patient Instructions (Signed)
I had a long d/w patient and wife  about his remote stroke, risk for recurrent stroke/TIAs, personally independently reviewed imaging studies and stroke evaluation results and answered questions.Continue Plavix  for secondary stroke prevention and maintain strict control of hypertension with blood pressure goal below 130/90, diabetes with hemoglobin A1c goal below 6.5% and lipids with LDL cholesterol goal below 100 mg/dL. I also advised the patient to eat a healthy diet with plenty of whole grains, cereals, fruits and vegetables, exercise regularly and maintain ideal body weight .I advised the patient to see his primary care physician to discuss his complaints of generalized weakness, fatigability which may be related to anemia or recurrence of his GI bleeding. Greater than 50% of this 25 minute visit was spent in counseling and coordination of care. Followup in the future with me in one year or call earlier if necessary.

## 2015-04-23 ENCOUNTER — Other Ambulatory Visit: Payer: Self-pay | Admitting: Internal Medicine

## 2015-04-23 DIAGNOSIS — R609 Edema, unspecified: Secondary | ICD-10-CM

## 2015-04-23 DIAGNOSIS — M7989 Other specified soft tissue disorders: Secondary | ICD-10-CM

## 2015-04-27 ENCOUNTER — Ambulatory Visit
Admission: RE | Admit: 2015-04-27 | Discharge: 2015-04-27 | Disposition: A | Discharge status: Home / Self Care | Payer: Medicare Other | Source: Ambulatory Visit | Attending: Internal Medicine | Admitting: Internal Medicine

## 2015-04-27 DIAGNOSIS — R609 Edema, unspecified: Secondary | ICD-10-CM

## 2015-09-14 ENCOUNTER — Observation Stay (HOSPITAL_COMMUNITY)
Admission: EM | Admit: 2015-09-14 | Discharge: 2015-09-16 | Disposition: A | Discharge status: Home / Self Care | Payer: Medicare Other | Attending: Internal Medicine | Admitting: Internal Medicine

## 2015-09-14 ENCOUNTER — Encounter (HOSPITAL_COMMUNITY): Payer: Self-pay

## 2015-09-14 ENCOUNTER — Emergency Department (HOSPITAL_COMMUNITY): Payer: Medicare Other

## 2015-09-14 ENCOUNTER — Observation Stay (HOSPITAL_COMMUNITY): Payer: Medicare Other

## 2015-09-14 DIAGNOSIS — F4310 Post-traumatic stress disorder, unspecified: Secondary | ICD-10-CM | POA: Insufficient documentation

## 2015-09-14 DIAGNOSIS — Z8673 Personal history of transient ischemic attack (TIA), and cerebral infarction without residual deficits: Secondary | ICD-10-CM | POA: Diagnosis not present

## 2015-09-14 DIAGNOSIS — Z7902 Long term (current) use of antithrombotics/antiplatelets: Secondary | ICD-10-CM | POA: Insufficient documentation

## 2015-09-14 DIAGNOSIS — M159 Polyosteoarthritis, unspecified: Secondary | ICD-10-CM | POA: Insufficient documentation

## 2015-09-14 DIAGNOSIS — I10 Essential (primary) hypertension: Secondary | ICD-10-CM | POA: Diagnosis not present

## 2015-09-14 DIAGNOSIS — E1140 Type 2 diabetes mellitus with diabetic neuropathy, unspecified: Secondary | ICD-10-CM | POA: Diagnosis not present

## 2015-09-14 DIAGNOSIS — E11319 Type 2 diabetes mellitus with unspecified diabetic retinopathy without macular edema: Secondary | ICD-10-CM | POA: Diagnosis not present

## 2015-09-14 DIAGNOSIS — I152 Hypertension secondary to endocrine disorders: Secondary | ICD-10-CM | POA: Diagnosis present

## 2015-09-14 DIAGNOSIS — I5032 Chronic diastolic (congestive) heart failure: Secondary | ICD-10-CM | POA: Diagnosis not present

## 2015-09-14 DIAGNOSIS — H538 Other visual disturbances: Secondary | ICD-10-CM | POA: Diagnosis present

## 2015-09-14 DIAGNOSIS — E785 Hyperlipidemia, unspecified: Secondary | ICD-10-CM | POA: Diagnosis not present

## 2015-09-14 DIAGNOSIS — E1122 Type 2 diabetes mellitus with diabetic chronic kidney disease: Secondary | ICD-10-CM | POA: Diagnosis not present

## 2015-09-14 DIAGNOSIS — E1159 Type 2 diabetes mellitus with other circulatory complications: Secondary | ICD-10-CM | POA: Diagnosis present

## 2015-09-14 DIAGNOSIS — N183 Chronic kidney disease, stage 3 unspecified: Secondary | ICD-10-CM | POA: Diagnosis present

## 2015-09-14 DIAGNOSIS — G451 Carotid artery syndrome (hemispheric): Secondary | ICD-10-CM | POA: Diagnosis not present

## 2015-09-14 DIAGNOSIS — G459 Transient cerebral ischemic attack, unspecified: Principal | ICD-10-CM | POA: Insufficient documentation

## 2015-09-14 DIAGNOSIS — Z794 Long term (current) use of insulin: Secondary | ICD-10-CM | POA: Diagnosis not present

## 2015-09-14 DIAGNOSIS — Z79899 Other long term (current) drug therapy: Secondary | ICD-10-CM | POA: Insufficient documentation

## 2015-09-14 DIAGNOSIS — G4733 Obstructive sleep apnea (adult) (pediatric): Secondary | ICD-10-CM | POA: Insufficient documentation

## 2015-09-14 DIAGNOSIS — E119 Type 2 diabetes mellitus without complications: Secondary | ICD-10-CM

## 2015-09-14 DIAGNOSIS — I130 Hypertensive heart and chronic kidney disease with heart failure and stage 1 through stage 4 chronic kidney disease, or unspecified chronic kidney disease: Secondary | ICD-10-CM | POA: Diagnosis not present

## 2015-09-14 LAB — I-STAT TROPONIN, ED: TROPONIN I, POC: 0 ng/mL (ref 0.00–0.08)

## 2015-09-14 LAB — COMPREHENSIVE METABOLIC PANEL
ALBUMIN: 3 g/dL — AB (ref 3.5–5.0)
ALK PHOS: 57 U/L (ref 38–126)
ALT: 14 U/L — ABNORMAL LOW (ref 17–63)
ANION GAP: 10 (ref 5–15)
AST: 17 U/L (ref 15–41)
BILIRUBIN TOTAL: 0.8 mg/dL (ref 0.3–1.2)
BUN: 14 mg/dL (ref 6–20)
CALCIUM: 9.3 mg/dL (ref 8.9–10.3)
CO2: 29 mmol/L (ref 22–32)
Chloride: 101 mmol/L (ref 101–111)
Creatinine, Ser: 1.95 mg/dL — ABNORMAL HIGH (ref 0.61–1.24)
GFR calc Af Amer: 39 mL/min — ABNORMAL LOW (ref >60)
GFR, EST NON AFRICAN AMERICAN: 34 mL/min — AB (ref >60)
GLUCOSE: 235 mg/dL — AB (ref 65–99)
POTASSIUM: 3.2 mmol/L — AB (ref 3.5–5.1)
Sodium: 140 mmol/L (ref 135–145)
TOTAL PROTEIN: 6 g/dL — AB (ref 6.5–8.1)

## 2015-09-14 LAB — GLUCOSE, CAPILLARY: GLUCOSE-CAPILLARY: 153 mg/dL — AB (ref 65–99)

## 2015-09-14 LAB — RAPID URINE DRUG SCREEN, HOSP PERFORMED
Amphetamines: NOT DETECTED
BARBITURATES: NOT DETECTED
Benzodiazepines: NOT DETECTED
Cocaine: NOT DETECTED
Opiates: NOT DETECTED
TETRAHYDROCANNABINOL: NOT DETECTED

## 2015-09-14 LAB — URINALYSIS, ROUTINE W REFLEX MICROSCOPIC
Bilirubin Urine: NEGATIVE
Glucose, UA: 100 mg/dL — AB
KETONES UR: NEGATIVE mg/dL
LEUKOCYTES UA: NEGATIVE
NITRITE: NEGATIVE
Specific Gravity, Urine: 1.024 (ref 1.005–1.030)
pH: 5 (ref 5.0–8.0)

## 2015-09-14 LAB — APTT: aPTT: 26 seconds (ref 24–37)

## 2015-09-14 LAB — I-STAT CHEM 8, ED
BUN: 16 mg/dL (ref 6–20)
CALCIUM ION: 1.21 mmol/L (ref 1.13–1.30)
CREATININE: 1.7 mg/dL — AB (ref 0.61–1.24)
Chloride: 100 mmol/L — ABNORMAL LOW (ref 101–111)
GLUCOSE: 228 mg/dL — AB (ref 65–99)
HEMATOCRIT: 36 % — AB (ref 39.0–52.0)
HEMOGLOBIN: 12.2 g/dL — AB (ref 13.0–17.0)
Potassium: 3.2 mmol/L — ABNORMAL LOW (ref 3.5–5.1)
Sodium: 140 mmol/L (ref 135–145)
TCO2: 28 mmol/L (ref 0–100)

## 2015-09-14 LAB — DIFFERENTIAL
Basophils Absolute: 0.1 10*3/uL (ref 0.0–0.1)
Basophils Relative: 1 %
EOS ABS: 0.1 10*3/uL (ref 0.0–0.7)
EOS PCT: 1 %
LYMPHS ABS: 0.9 10*3/uL (ref 0.7–4.0)
LYMPHS PCT: 20 %
MONOS PCT: 6 %
Monocytes Absolute: 0.3 10*3/uL (ref 0.1–1.0)
NEUTROS PCT: 72 %
Neutro Abs: 3.5 10*3/uL (ref 1.7–7.7)

## 2015-09-14 LAB — CBC
HEMATOCRIT: 34.4 % — AB (ref 39.0–52.0)
HEMOGLOBIN: 11.8 g/dL — AB (ref 13.0–17.0)
MCH: 29.9 pg (ref 26.0–34.0)
MCHC: 34.3 g/dL (ref 30.0–36.0)
MCV: 87.3 fL (ref 78.0–100.0)
Platelets: 161 10*3/uL (ref 150–400)
RBC: 3.94 MIL/uL — AB (ref 4.22–5.81)
RDW: 13.2 % (ref 11.5–15.5)
WBC: 4.8 10*3/uL (ref 4.0–10.5)

## 2015-09-14 LAB — PROTIME-INR
INR: 1.22 (ref 0.00–1.49)
Prothrombin Time: 15.6 seconds — ABNORMAL HIGH (ref 11.6–15.2)

## 2015-09-14 LAB — ETHANOL: Alcohol, Ethyl (B): <5 mg/dL (ref <5)

## 2015-09-14 LAB — URINE MICROSCOPIC-ADD ON

## 2015-09-14 MED ORDER — SIMVASTATIN 40 MG PO TABS
40.0000 mg | ORAL_TABLET | Freq: Every evening | ORAL | Status: DC
  Administered 2015-09-14 – 2015-09-15 (×2): 40 mg via ORAL
  Filled 2015-09-14 (×3): qty 1

## 2015-09-14 MED ORDER — SPIRONOLACTONE 25 MG PO TABS
25.0000 mg | ORAL_TABLET | Freq: Every day | ORAL | Status: DC
Start: 2015-09-15 — End: 2015-09-16
  Administered 2015-09-15 – 2015-09-16 (×2): 25 mg via ORAL
  Filled 2015-09-14 (×2): qty 1

## 2015-09-14 MED ORDER — CITALOPRAM HYDROBROMIDE 10 MG PO TABS: 20.0000 mg | ORAL_TABLET | Freq: Every day | ORAL | Status: DC

## 2015-09-14 MED ORDER — INSULIN ASPART 100 UNIT/ML ~~LOC~~ SOLN
4.0000 [IU] | Freq: Three times a day (TID) | SUBCUTANEOUS | Status: DC
  Administered 2015-09-15 – 2015-09-16 (×4): 4 [IU] via SUBCUTANEOUS

## 2015-09-14 MED ORDER — ALBUTEROL SULFATE HFA 108 (90 BASE) MCG/ACT IN AERS: 2.0000 | INHALATION_SPRAY | Freq: Four times a day (QID) | RESPIRATORY_TRACT | Status: DC | PRN

## 2015-09-14 MED ORDER — STROKE: EARLY STAGES OF RECOVERY BOOK
Freq: Once | Status: AC
  Administered 2015-09-14: 22:00:00
  Filled 2015-09-14: qty 1

## 2015-09-14 MED ORDER — CLONIDINE HCL 0.3 MG/24HR TD PTWK: 0.3000 mg | MEDICATED_PATCH | TRANSDERMAL | Status: DC

## 2015-09-14 MED ORDER — LOSARTAN POTASSIUM 50 MG PO TABS
100.0000 mg | ORAL_TABLET | Freq: Every day | ORAL | Status: DC
  Administered 2015-09-14 – 2015-09-16 (×3): 100 mg via ORAL
  Filled 2015-09-14 (×3): qty 2

## 2015-09-14 MED ORDER — ACETAMINOPHEN 500 MG PO TABS
500.0000 mg | ORAL_TABLET | Freq: Four times a day (QID) | ORAL | Status: DC | PRN
  Administered 2015-09-15 (×2): 500 mg via ORAL
  Filled 2015-09-14 (×2): qty 1

## 2015-09-14 MED ORDER — HEPARIN SODIUM (PORCINE) 5000 UNIT/ML IJ SOLN
5000.0000 [IU] | Freq: Three times a day (TID) | INTRAMUSCULAR | Status: DC
  Administered 2015-09-14 – 2015-09-16 (×5): 5000 [IU] via SUBCUTANEOUS
  Filled 2015-09-14 (×6): qty 1

## 2015-09-14 MED ORDER — INSULIN GLARGINE 100 UNIT/ML ~~LOC~~ SOLN
30.0000 [IU] | Freq: Every day | SUBCUTANEOUS | Status: DC
  Administered 2015-09-14 – 2015-09-15 (×2): 30 [IU] via SUBCUTANEOUS
  Filled 2015-09-14 (×3): qty 0.3

## 2015-09-14 MED ORDER — CARVEDILOL 12.5 MG PO TABS
25.0000 mg | ORAL_TABLET | Freq: Two times a day (BID) | ORAL | Status: DC
  Administered 2015-09-15 – 2015-09-16 (×3): 25 mg via ORAL
  Filled 2015-09-14 (×4): qty 2

## 2015-09-14 MED ORDER — FLUTICASONE PROPIONATE 50 MCG/ACT NA SUSP: 2.0000 | Freq: Every day | NASAL | Status: DC | PRN

## 2015-09-14 MED ORDER — LATANOPROST 0.005 % OP SOLN
1.0000 [drp] | Freq: Every day | OPHTHALMIC | Status: DC
  Administered 2015-09-14 – 2015-09-15 (×2): 1 [drp] via OPHTHALMIC
  Filled 2015-09-14: qty 2.5

## 2015-09-14 MED ORDER — CITALOPRAM HYDROBROMIDE 10 MG PO TABS
20.0000 mg | ORAL_TABLET | Freq: Every day | ORAL | Status: DC
  Administered 2015-09-15 – 2015-09-16 (×2): 20 mg via ORAL
  Filled 2015-09-14 (×2): qty 2

## 2015-09-14 MED ORDER — CLOPIDOGREL BISULFATE 75 MG PO TABS
75.0000 mg | ORAL_TABLET | Freq: Every day | ORAL | Status: DC
  Administered 2015-09-15: 75 mg via ORAL
  Filled 2015-09-14: qty 1

## 2015-09-14 MED ORDER — GABAPENTIN 300 MG PO CAPS: 300.0000 mg | ORAL_CAPSULE | Freq: Every day | ORAL | Status: DC | PRN

## 2015-09-14 MED ORDER — POLYVINYL ALCOHOL 1.4 % OP SOLN: 1.0000 [drp] | Freq: Every day | OPHTHALMIC | Status: DC | PRN

## 2015-09-14 MED ORDER — ALBUTEROL SULFATE (2.5 MG/3ML) 0.083% IN NEBU: 2.5000 mg | INHALATION_SOLUTION | Freq: Four times a day (QID) | RESPIRATORY_TRACT | Status: DC | PRN

## 2015-09-14 MED ORDER — CLOPIDOGREL BISULFATE 75 MG PO TABS: 75.0000 mg | ORAL_TABLET | Freq: Every day | ORAL | Status: DC

## 2015-09-14 MED ORDER — INSULIN ASPART 100 UNIT/ML ~~LOC~~ SOLN
0.0000 [IU] | Freq: Three times a day (TID) | SUBCUTANEOUS | Status: DC
  Administered 2015-09-15: 4 [IU] via SUBCUTANEOUS
  Administered 2015-09-15: 3 [IU] via SUBCUTANEOUS

## 2015-09-14 NOTE — ED Provider Notes (Signed)
CSN: [id number]     Arrival date & time 09/14/15  1647 History   First MD Initiated Contact with Patient 09/14/15 1649     Chief Complaint  Patient presents with  . Transient Ischemic Attack     (Consider location/radiation/quality/duration/timing/severity/associated sxs/prior Treatment) HPI Patient presents with acute onset blurred vision, slurred speech, and reported left-sided facial droop. Patient was eating with his family [employer] when symptoms started. Last seen normal was 1600. EMS called. Slurred speech and vision had improved and still had mild left-sided facial droop. This resolved in route. Patient states his speech and vision are at its baseline. Denies any focal weakness or numbness. Patient states he does have some generalized weakness. Denied any chest pain or pressure at any time. No shortness of breath. Patient did initially have mild nausea which is completely resolved. No recent illnesses, fever or chills. No vomiting or diarrhea. Patient is on Plavix and has a previous history of stroke with right-sided symptoms.  Past Medical History  Diagnosis Date  . Hypertension   . Diabetic retinopathy ([place])   . Diabetic neuropathy ([place])   . Colon polyps   . History of blood transfusion 10/30/2014    hematochezia  . Anemia   . OSA on CPAP     "suppose to wear mask; I've got a call in for an equipment change" (10/30/2014)  . Type II diabetes mellitus ([place])   . Headache     "@ least 3 times/wk" (10/30/2014)  . Stroke [facility]) 2014    left extremity deficits; facial left  . Arthritis     "right arm, right ankle, right side" (10/30/2014)  . History of gout   . PTSD (post-traumatic stress disorder)     "service related"   Past Surgical History  Procedure Laterality Date  . Cataract extraction w/ intraocular lens  implant, bilateral Bilateral 2014-2015  . Tumor removal Right ~ 1976    "arm; had to take bone left hip to add to the repair"  . Bone graft hip iliac crest Left ~  1976   Family History  Problem Relation Age of Onset  . Diabetes Mother   . Breast cancer Mother   . Stroke Brother   . Neurofibromatosis Maternal Uncle   . Heart attack Mother     CABG - Age 49   Social History  Substance Use Topics  . Smoking status: Never Smoker   . Smokeless tobacco: Never Used  . Alcohol Use: 0.0 oz/week    0 Standard drinks or equivalent per week     Comment: 10/30/2014 "might have a beer a couple times/yr"    Review of Systems  Constitutional: Negative for fever and chills.  Eyes: Positive for visual disturbance.  Respiratory: Negative for shortness of breath.   Cardiovascular: Negative for chest pain, palpitations and leg swelling.  Gastrointestinal: Positive for nausea. Negative for vomiting, abdominal pain and diarrhea.  Musculoskeletal: Negative for back pain and neck pain.  Skin: Negative for rash and wound.  Neurological: Positive for speech difficulty. Negative for dizziness, weakness, light-headedness, numbness and headaches.  All other systems reviewed and are negative.     Allergies  Metformin and related  Home Medications   Prior to Admission medications   Medication Sig Start Date End Date Taking? Authorizing Provider  acetaminophen (TYLENOL) 500 MG tablet Take 1 tablet (500 mg total) by mouth every 6 (six) hours as needed for mild pain, moderate pain, fever or headache. 11/01/14  Yes Juluis Mire, MD  albuterol (  PROVENTIL HFA;VENTOLIN HFA) 108 (90 BASE) MCG/ACT inhaler Inhale 2 puffs into the lungs every 6 (six) hours as needed for wheezing.   Yes Historical Provider, MD  carvedilol (COREG) 25 MG tablet Take 25 mg by mouth 2 (two) times daily with a meal. Reported on 09/14/2015   Yes Historical Provider, MD  citalopram (CELEXA) 20 MG tablet Take 20 mg by mouth daily.   Yes Historical Provider, MD  cloNIDine (CATAPRES - DOSED IN MG/24 HR) 0.3 mg/24hr Place 1 patch onto the skin once a week. No specific day   Yes Historical Provider, MD   clopidogrel (PLAVIX) 75 MG tablet Take 1 tablet (75 mg total) by mouth at bedtime. 11/08/14  Yes Marjan Rabbani, MD  fluocinonide cream (LIDEX) [id number] % Apply 1 application topically 2 (two) times daily.   Yes Historical Provider, MD  fluticasone (FLONASE) 50 MCG/ACT nasal spray Place 2 sprays into the nose daily as needed for allergies.    Yes Historical Provider, MD  gabapentin (NEURONTIN) 300 MG capsule Take 300 mg by mouth daily as needed (leg pain).    Yes Historical Provider, MD  insulin aspart (NOVOLOG) 100 UNIT/ML injection Inject 5-15 Units into the skin 3 (three) times daily with meals. Per sliding scale   Yes Historical Provider, MD  insulin glargine (LANTUS) 100 UNIT/ML injection Inject 30-49 Units into the skin at bedtime.    Yes Historical Provider, MD  latanoprost (XALATAN) 0.005 % ophthalmic solution Place 1 drop into both eyes at bedtime.    Yes Historical Provider, MD  losartan (COZAAR) 100 MG tablet Take 100 mg by mouth daily.   Yes Historical Provider, MD  Multiple Vitamins-Minerals (MULTIVITAMIN PO) Take 1 tablet by mouth daily.   Yes Historical Provider, MD  polyvinyl alcohol (LIQUIFILM TEARS) 1.4 % ophthalmic solution Place 1 drop into both eyes 5 (five) times daily as needed (for dry eyes).   Yes Historical Provider, MD  simvastatin (ZOCOR) 40 MG tablet Take 40 mg by mouth every evening.   Yes Historical Provider, MD  Skin Protectants, Misc. (EUCERIN) cream Apply 1 application topically daily.    Yes Historical Provider, MD  spironolactone (ALDACTONE) 25 MG tablet Take 25 mg by mouth daily.   Yes Historical Provider, MD  niacin 100 MG tablet Take 1 tablet (100 mg total) by mouth at bedtime. 09/16/15   Thurnell Lose, MD   BP 160/85 mmHg  Pulse 62  Temp(Src) 98 F (36.7 C) (Oral)  Resp 20  Ht 6\' 4"  (1.93 m)  Wt 214 lb 1 oz (97.098 kg)  BMI 26.07 kg/m2  SpO2 100% Physical Exam  Constitutional: He is oriented to person, place, and time. He appears well-developed and  well-nourished. No distress.  HENT:  Head: Normocephalic and atraumatic.  Mouth/Throat: Oropharynx is clear and moist. No oropharyngeal exudate.  Eyes: EOM are normal. Pupils are equal, round, and reactive to light.  Neck: Normal range of motion. Neck supple.  Cardiovascular: Normal rate and regular rhythm.  Exam reveals no gallop and no friction rub.   No murmur heard. Pulmonary/Chest: Effort normal and breath sounds normal. No respiratory distress. He has no wheezes. He has no rales. He exhibits no tenderness.  Abdominal: Soft. Bowel sounds are normal. He exhibits no distension and no mass. There is no tenderness. There is no rebound and no guarding.  Musculoskeletal: Normal range of motion. He exhibits no edema or tenderness.  Neurological: He is alert and oriented to person, place, and time.  Patient is alert and  oriented x3 with clear, goal oriented speech. Patient has 5/5 motor in all extremities. No facial asymmetry noted. Sensation is intact to light touch. Bilateral finger-to-nose is normal with no signs of dysmetria.   Skin: Skin is warm and dry. No rash noted. No erythema.  Psychiatric: He has a normal mood and affect. His behavior is normal.  Nursing note and vitals reviewed.   ED Course  Procedures (including critical care time) Labs Review Labs Reviewed  PROTIME-INR - Abnormal; Notable for the following:    Prothrombin Time 15.6 (*)    All other components within normal limits  CBC - Abnormal; Notable for the following:    RBC 3.94 (*)    Hemoglobin 11.8 (*)    HCT 34.4 (*)    All other components within normal limits  COMPREHENSIVE METABOLIC PANEL - Abnormal; Notable for the following:    Potassium 3.2 (*)    Glucose, Bld 235 (*)    Creatinine, Ser 1.95 (*)    Total Protein 6.0 (*)    Albumin 3.0 (*)    ALT 14 (*)    GFR calc non Af Amer 34 (*)    GFR calc Af Amer 39 (*)    All other components within normal limits  URINALYSIS, ROUTINE W REFLEX MICROSCOPIC (NOT  AT [facility]) - Abnormal; Notable for the following:    Color, Urine AMBER (*)    Glucose, UA 100 (*)    Hgb urine dipstick TRACE (*)    Protein, ur >300 (*)    All other components within normal limits  HEMOGLOBIN A1C - Abnormal; Notable for the following:    Hgb A1c MFr Bld 7.9 (*)    All other components within normal limits  LIPID PANEL - Abnormal; Notable for the following:    Cholesterol 215 (*)    LDL Cholesterol 137 (*)    All other components within normal limits  URINE MICROSCOPIC-ADD ON - Abnormal; Notable for the following:    Squamous Epithelial / LPF 0-5 (*)    Bacteria, UA RARE (*)    Casts HYALINE CASTS (*)    All other components within normal limits  GLUCOSE, CAPILLARY - Abnormal; Notable for the following:    Glucose-Capillary 153 (*)    All other components within normal limits  GLUCOSE, CAPILLARY - Abnormal; Notable for the following:    Glucose-Capillary 201 (*)    All other components within normal limits  GLUCOSE, CAPILLARY - Abnormal; Notable for the following:    Glucose-Capillary 120 (*)    All other components within normal limits  GLUCOSE, CAPILLARY - Abnormal; Notable for the following:    Glucose-Capillary 236 (*)    All other components within normal limits  GLUCOSE, CAPILLARY - Abnormal; Notable for the following:    Glucose-Capillary 186 (*)    All other components within normal limits  GLUCOSE, CAPILLARY - Abnormal; Notable for the following:    Glucose-Capillary 103 (*)    All other components within normal limits  I-STAT CHEM 8, ED - Abnormal; Notable for the following:    Potassium 3.2 (*)    Chloride 100 (*)    Creatinine, Ser 1.70 (*)    Glucose, Bld 228 (*)    Hemoglobin 12.2 (*)    HCT 36.0 (*)    All other components within normal limits  ETHANOL  APTT  DIFFERENTIAL  URINE RAPID DRUG SCREEN, HOSP PERFORMED  TROPONIN I  TROPONIN I  TROPONIN I  POTASSIUM  MAGNESIUM  I-STAT TROPOININ,  ED    Imaging Review Ct Head Wo  Contrast  09/14/2015  CLINICAL DATA:  Episode of slurred speech this evening lasting 1 hour, now resolved. EXAM: CT HEAD WITHOUT CONTRAST TECHNIQUE: Contiguous axial images were obtained from the base of the skull through the vertex without intravenous contrast. COMPARISON:  03/17/2014 FINDINGS: Skull and Sinuses:Negative for fracture or destructive process. The visualized mastoids, middle ears, and imaged paranasal sinuses are clear. Visualized orbits: Bilateral cataract resection. Brain: No evidence of acute infarction, hemorrhage, hydrocephalus, or mass lesion/mass effect. Patchy cerebral white matter disease, which is mild for age, is underestimated relative to brain MRI from 2014. This is consistent with chronic small vessel ischemia in this patient with history of hypertension and diabetes. IMPRESSION: 1. No acute finding. 2. Chronic small vessel disease, mild. Electronically Signed   By: Monte Fantasia M.D.   On: 09/14/2015 17:58   Mr Brain Wo Contrast  09/14/2015  CLINICAL DATA:  Initial evaluation for transient slurred speech, blurry vision, left-sided facial droop. EXAM: MRI HEAD WITHOUT CONTRAST MRA HEAD WITHOUT CONTRAST TECHNIQUE: Multiplanar, multiecho pulse sequences of the brain and surrounding structures were obtained without intravenous contrast. Angiographic images of the head were obtained using MRA technique without contrast. COMPARISON:  Prior CT from earlier the same day. FINDINGS: MRI HEAD FINDINGS Diffuse prominence of the CSF containing spaces is compatible with generalized age-related cerebral atrophy. Mild patchy and confluent T2/FLAIR hyperintensity within the periventricular white matter most consistent with chronic small vessel ischemic disease, mild in nature. No abnormal foci of restricted diffusion to suggest acute intracranial infarct. Major intracranial vascular flow voids are maintained. Gray-white matter differentiation maintained. No acute intracranial hemorrhage. Small  chronic micro hemorrhage noted within the left lentiform nucleus. No other chronic hemorrhage. No mass lesion, midline shift, or mass effect. No hydrocephalus. No extra-axial fluid collection. Craniocervical junction is normal. Pituitary gland within normal limits. No acute abnormality about the orbits. Mild exophthalmos. Sequela prior bilateral lens extraction noted. Paranasal sinuses are clear. Trace opacity within the inferior right mastoid air cells. Mastoid air cells are otherwise clear. Inner ear structures grossly normal. Bone marrow signal intensity normal. No scalp soft tissue abnormality. MRA HEAD FINDINGS ANTERIOR CIRCULATION: Visualized distal cervical segments of the internal carotid arteries are widely patent with antegrade flow. Petrous, cavernous, and supraclinoid segments widely patent. Right A1 segment is hypoplastic, likely accounting for the slight caliber difference within the ICAs (right smaller than left). Left A1 segment widely patent. Anterior communicating artery normal. Anterior cerebral arteries well opacified. M1 segments widely patent without stenosis or occlusion. MCA bifurcations normal. No proximal M2 branch stenosis or occlusion. Distal MCA branches well opacified and symmetric. POSTERIOR CIRCULATION: Vertebral arteries patent to the vertebrobasilar junction. Right vertebral artery is dominant. Posterior inferior cerebral arteries patent bilaterally. Focal irregular mild to moderate stenosis within the mid basilar artery (series 5, image 87). The basilar artery is well opacified distally. Superior cerebral arteries patent bilaterally but demonstrate multi focal atheromatous irregularity. Left P1 segment is diminutive and hypoplastic with a prominent left posterior communicating artery. Left PCA demonstrates multifocal atheromatous irregularity but is well opacified to its distal aspect. Right P1 segment widely patent. Focal short-segment moderate stenosis within the proximal right  P2 segment (series 506, image 11). Right PCA is well opacified distally. Distal branch atheromatous irregularity within the right PCA itself. No aneurysm or vascular malformation. IMPRESSION: MRI HEAD IMPRESSION: 1. No acute intracranial infarct or other process identified. 2. Mild age-related cerebral atrophy with an chronic small  vessel ischemic disease. MRA HEAD IMPRESSION: 1. No large or proximal arterial branch occlusion within the intracranial circulation. 2. 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 No other correctable or high-grade stenosis within the intracranial circulation. 3. Small vessel atheromatous irregularity within the SCAs ans PCAs bilaterally. Electronically Signed   By: Jeannine Boga M.D.   On: 09/14/2015 21:38   Mr Jodene Nam Head/brain Wo Cm  09/14/2015  CLINICAL DATA:  Initial evaluation for transient slurred speech, blurry vision, left-sided facial droop. EXAM: MRI HEAD WITHOUT CONTRAST MRA HEAD WITHOUT CONTRAST TECHNIQUE: Multiplanar, multiecho pulse sequences of the brain and surrounding structures were obtained without intravenous contrast. Angiographic images of the head were obtained using MRA technique without contrast. COMPARISON:  Prior CT from earlier the same day. FINDINGS: MRI HEAD FINDINGS Diffuse prominence of the CSF containing spaces is compatible with generalized age-related cerebral atrophy. Mild patchy and confluent T2/FLAIR hyperintensity within the periventricular white matter most consistent with chronic small vessel ischemic disease, mild in nature. No abnormal foci of restricted diffusion to suggest acute intracranial infarct. Major intracranial vascular flow voids are maintained. Gray-white matter differentiation maintained. No acute intracranial hemorrhage. Small chronic micro hemorrhage noted within the left lentiform nucleus. No other chronic hemorrhage. No mass lesion, midline shift, or mass effect. No hydrocephalus. No extra-axial  fluid collection. Craniocervical junction is normal. Pituitary gland within normal limits. No acute abnormality about the orbits. Mild exophthalmos. Sequela prior bilateral lens extraction noted. Paranasal sinuses are clear. Trace opacity within the inferior right mastoid air cells. Mastoid air cells are otherwise clear. Inner ear structures grossly normal. Bone marrow signal intensity normal. No scalp soft tissue abnormality. MRA HEAD FINDINGS ANTERIOR CIRCULATION: Visualized distal cervical segments of the internal carotid arteries are widely patent with antegrade flow. Petrous, cavernous, and supraclinoid segments widely patent. Right A1 segment is hypoplastic, likely accounting for the slight caliber difference within the ICAs (right smaller than left). Left A1 segment widely patent. Anterior communicating artery normal. Anterior cerebral arteries well opacified. M1 segments widely patent without stenosis or occlusion. MCA bifurcations normal. No proximal M2 branch stenosis or occlusion. Distal MCA branches well opacified and symmetric. POSTERIOR CIRCULATION: Vertebral arteries patent to the vertebrobasilar junction. Right vertebral artery is dominant. Posterior inferior cerebral arteries patent bilaterally. Focal irregular mild to moderate stenosis within the mid basilar artery (series 5, image 87). The basilar artery is well opacified distally. Superior cerebral arteries patent bilaterally but demonstrate multi focal atheromatous irregularity. Left P1 segment is diminutive and hypoplastic with a prominent left posterior communicating artery. Left PCA demonstrates multifocal atheromatous irregularity but is well opacified to its distal aspect. Right P1 segment widely patent. Focal short-segment moderate stenosis within the proximal right P2 segment (series 506, image 11). Right PCA is well opacified distally. Distal branch atheromatous irregularity within the right PCA itself. No aneurysm or vascular  malformation. IMPRESSION: MRI HEAD IMPRESSION: 1. No acute intracranial infarct or other process identified. 2. Mild age-related cerebral atrophy with an chronic small vessel ischemic disease. MRA HEAD IMPRESSION: 1. No large or proximal arterial branch occlusion within the intracranial circulation. 2. 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 No other correctable or high-grade stenosis within the intracranial circulation. 3. Small vessel atheromatous irregularity within the SCAs ans PCAs bilaterally. Electronically Signed   By: Jeannine Boga M.D.   On: 09/14/2015 21:38   I have personally reviewed and evaluated these images and lab results as part of my medical decision-making.  EKG Interpretation None      MDM   Final diagnoses:  Transient cerebral ischemia, unspecified transient cerebral ischemia type    Social presents with left-sided facial droop and slurred speech and blurred vision. Symptoms appear to have resolved completely. Will workup for possible TIA.  CT without acute findings. Discussed with hospitalist Dr. Janann Colonel. We'll evaluate patient. Will admit to internal medicine.  Julianne Rice, MD 09/16/15 1328

## 2015-09-14 NOTE — H&P (Signed)
[facility] History and Physical  Cody Silva [id number] DOB: [birth year] DOA: 09/14/2015  Referring physician: EDP PCP: Pcp Not In System   Chief Complaint: TIA   HPI: Cody Silva is a 70 y.o. male h/o DM2, HTN, R sided symptom stroke in 2014 who presents to ED with c/o transient episode of blurred vision, slurred speech, L sided facial droop.  Blurred vision is described as a curtian blocking certain parts of his vision.  He was LKW at 1600 while eating dinner with family, had sudden onset of these symptoms which improved on arrival to ED.  Takes plavix daily at home.  Review of Systems: Systems reviewed.  As above, otherwise negative  Past Medical History  Diagnosis Date  . Hypertension   . Diabetic retinopathy ([place])   . Diabetic neuropathy ([place])   . Colon polyps   . History of blood transfusion 10/30/2014    hematochezia  . Anemia   . OSA on CPAP     "suppose to wear mask; I've got a call in for an equipment change" (10/30/2014)  . Type II diabetes mellitus ([place])   . Headache     "@ least 3 times/wk" (10/30/2014)  . Stroke [facility]) 2014    left extremity deficits; facial left  . Arthritis     "right arm, right ankle, right side" (10/30/2014)  . History of gout   . PTSD (post-traumatic stress disorder)     "service related"   Past Surgical History  Procedure Laterality Date  . Cataract extraction w/ intraocular lens  implant, bilateral Bilateral 2014-2015  . Tumor removal Right ~ 1976    "arm; had to take bone left hip to add to the repair"  . Bone graft hip iliac crest Left ~ 1976   Social History:  reports that he has never smoked. He has never used smokeless tobacco. He reports that he drinks alcohol. He reports that he does not use illicit drugs.  Allergies  Allergen Reactions  . Metformin And Related Diarrhea    Family History  Problem Relation Age of Onset  . Diabetes Mother   . Breast cancer Mother   . Stroke Brother   .  Neurofibromatosis Maternal Uncle      Prior to Admission medications   Medication Sig Start Date End Date Taking? Authorizing Provider  acetaminophen (TYLENOL) 500 MG tablet Take 1 tablet (500 mg total) by mouth every 6 (six) hours as needed for mild pain, moderate pain, fever or headache. 11/01/14  Yes Marjan Rabbani, MD  albuterol (PROVENTIL HFA;VENTOLIN HFA) 108 (90 BASE) MCG/ACT inhaler Inhale 2 puffs into the lungs every 6 (six) hours as needed for wheezing.   Yes Historical Provider, MD  carvedilol (COREG) 25 MG tablet Take 25 mg by mouth 2 (two) times daily with a meal. Reported on 09/14/2015   Yes Historical Provider, MD  citalopram (CELEXA) 20 MG tablet Take 20 mg by mouth daily.   Yes Historical Provider, MD  cloNIDine (CATAPRES - DOSED IN MG/24 HR) 0.3 mg/24hr Place 1 patch onto the skin once a week. No specific day   Yes Historical Provider, MD  clopidogrel (PLAVIX) 75 MG tablet Take 1 tablet (75 mg total) by mouth at bedtime. 11/08/14  Yes Marjan Rabbani, MD  fluocinonide cream (LIDEX) [id number] % Apply 1 application topically 2 (two) times daily.   Yes Historical Provider, MD  fluticasone (FLONASE) 50 MCG/ACT nasal spray Place 2 sprays into the nose daily as needed for allergies.    Yes  Historical Provider, MD  gabapentin (NEURONTIN) 300 MG capsule Take 300 mg by mouth daily as needed (leg pain).    Yes Historical Provider, MD  insulin aspart (NOVOLOG) 100 UNIT/ML injection Inject 5-15 Units into the skin 3 (three) times daily with meals. Per sliding scale   Yes Historical Provider, MD  insulin glargine (LANTUS) 100 UNIT/ML injection Inject 30-49 Units into the skin at bedtime.    Yes Historical Provider, MD  latanoprost (XALATAN) 0.005 % ophthalmic solution Place 1 drop into both eyes at bedtime.    Yes Historical Provider, MD  losartan (COZAAR) 100 MG tablet Take 100 mg by mouth daily.   Yes Historical Provider, MD  Multiple Vitamins-Minerals (MULTIVITAMIN PO) Take 1 tablet by mouth daily.    Yes Historical Provider, MD  polyvinyl alcohol (LIQUIFILM TEARS) 1.4 % ophthalmic solution Place 1 drop into both eyes 5 (five) times daily as needed (for dry eyes).   Yes Historical Provider, MD  simvastatin (ZOCOR) 40 MG tablet Take 40 mg by mouth every evening.   Yes Historical Provider, MD  Skin Protectants, Misc. (EUCERIN) cream Apply 1 application topically daily.    Yes Historical Provider, MD  spironolactone (ALDACTONE) 25 MG tablet Take 25 mg by mouth daily.   Yes Historical Provider, MD   Physical Exam: Filed Vitals:   09/14/15 1900 09/14/15 1901  BP: 153/86   Pulse: 77   Temp:  97.7 F (36.5 C)  Resp: 13     BP 153/86 mmHg  Pulse 77  Temp(Src) 97.7 F (36.5 C) (Oral)  Resp 13  SpO2 99%  General Appearance:    Alert, oriented, no distress, appears stated age  Head:    Normocephalic, atraumatic  Eyes:    PERRL, EOMI, sclera non-icteric        Nose:   Nares without drainage or epistaxis. Mucosa, turbinates normal  Throat:   Moist mucous membranes. Oropharynx without erythema or exudate.  Neck:   Supple. No carotid bruits.  No thyromegaly.  No lymphadenopathy.   Back:     No CVA tenderness, no spinal tenderness  Lungs:     Clear to auscultation bilaterally, without wheezes, rhonchi or rales  Chest wall:    No tenderness to palpitation  Heart:    Regular rate and rhythm without murmurs, gallops, rubs  Abdomen:     Soft, non-tender, nondistended, normal bowel sounds, no organomegaly  Genitalia:    deferred  Rectal:    deferred  Extremities:   No clubbing, cyanosis or edema.  Pulses:   2+ and symmetric all extremities  Skin:   Skin color, texture, turgor normal, no rashes or lesions  Lymph nodes:   Cervical, supraclavicular, and axillary nodes normal  Neurologic:   CNII-XII intact. Normal strength, sensation and reflexes      throughout    Labs on Admission:  Basic Metabolic Panel:  Recent Labs Lab 09/14/15 1755 09/14/15 1812  NA 140 140  K 3.2* 3.2*  CL 101  100*  CO2 29  --   GLUCOSE 235* 228*  BUN 14 16  CREATININE 1.95* 1.70*  CALCIUM 9.3  --    Liver Function Tests:  Recent Labs Lab 09/14/15 1755  AST 17  ALT 14*  ALKPHOS 57  BILITOT 0.8  PROT 6.0*  ALBUMIN 3.0*   No results for input(s): LIPASE, AMYLASE in the last 168 hours. No results for input(s): AMMONIA in the last 168 hours. CBC:  Recent Labs Lab 09/14/15 1755 09/14/15 1812  WBC 4.8  --  NEUTROABS 3.5  --   HGB 11.8* 12.2*  HCT 34.4* 36.0*  MCV 87.3  --   PLT 161  --    Cardiac Enzymes: No results for input(s): CKTOTAL, CKMB, CKMBINDEX, TROPONINI in the last 168 hours.  BNP (last 3 results) No results for input(s): PROBNP in the last 8760 hours. CBG: No results for input(s): GLUCAP in the last 168 hours.  Radiological Exams on Admission: Ct Head Wo Contrast  09/14/2015  CLINICAL DATA:  Episode of slurred speech this evening lasting 1 hour, now resolved. EXAM: CT HEAD WITHOUT CONTRAST TECHNIQUE: Contiguous axial images were obtained from the base of the skull through the vertex without intravenous contrast. COMPARISON:  03/17/2014 FINDINGS: Skull and Sinuses:Negative for fracture or destructive process. The visualized mastoids, middle ears, and imaged paranasal sinuses are clear. Visualized orbits: Bilateral cataract resection. Brain: No evidence of acute infarction, hemorrhage, hydrocephalus, or mass lesion/mass effect. Patchy cerebral white matter disease, which is mild for age, is underestimated relative to brain MRI from 2014. This is consistent with chronic small vessel ischemia in this patient with history of hypertension and diabetes. IMPRESSION: 1. No acute finding. 2. Chronic small vessel disease, mild. Electronically Signed   By: Monte Fantasia M.D.   On: 09/14/2015 17:58    EKG: Independently reviewed.  Assessment/Plan Principal Problem:   TIA (transient ischemic attack) Active Problems:   Diabetes mellitus ([place])   Hypertension   1. TIA  - 1. Stroke work up ordered 2. Neuro consult in chart 3. Suspect that major factors in this patient will be his DM2 and HTN which are already known to be causing end organ damage in this patient (diabetic neuropathy, CKD for which he follows with nephrology at the [facility]). 2. DM2 -  1. Lantus 30 units QHS 2. novolog 4 units mealtime 3. novolog sensitive scale SSI AC 3. HTN - continue home meds 4. CKD stage 3 - chronic, sees nephrology at [facility] [place] Status: Full  Family Communication: Family at bedside Disposition Plan: Admit to obs   Time spent: 70 min  GARDNER, JARED M. [facility] Pager [phone]  If 7AM-7PM, please contact the day team taking care of the patient [email or web address] Password [email or web address] 09/14/2015, 8:08 PM

## 2015-09-14 NOTE — ED Notes (Signed)
Phlebotomy at the bedside  

## 2015-09-14 NOTE — ED Notes (Signed)
MD Lita Mains at the bedside

## 2015-09-14 NOTE — ED Notes (Signed)
Attempted report 

## 2015-09-14 NOTE — Consult Note (Signed)
Stroke Consult Consulting Physician: Dr Lita Mains ED  Chief Complaint: slurred speech  HPI: Cody Silva is an 70 y.o. male hx of HTN, prior CVA presenting with transient episode of blurred vision, slurred speech and left-sided facial droop. Blurred vision described as curtain blocking certain parts of his vision. Per family, he was eating with LSW at 83 when he had sudden onset of these symptoms. Symptoms improved upon arrival to ED though there is a question of continued facial droop. He takes Plavix 75mg  daily at home.   CT head imaging reviewed, no acute process noted.   Date last known well: 09/14/2015 Time last known well: 1600 tPA Given: no, symptoms resolved, out of tPA window at time of evaluation Modified Rankin: Rankin Score=1  Past Medical History  Diagnosis Date  . Hypertension   . Diabetic retinopathy ([place])   . Diabetic neuropathy ([place])   . Colon polyps   . History of blood transfusion 10/30/2014    hematochezia  . Anemia   . OSA on CPAP     "suppose to wear mask; I've got a call in for an equipment change" (10/30/2014)  . Type II diabetes mellitus ([place])   . Headache     "@ least 3 times/wk" (10/30/2014)  . Stroke [facility]) 2014    left extremity deficits; facial left  . Arthritis     "right arm, right ankle, right side" (10/30/2014)  . History of gout   . PTSD (post-traumatic stress disorder)     "service related"    Past Surgical History  Procedure Laterality Date  . Cataract extraction w/ intraocular lens  implant, bilateral Bilateral 2014-2015  . Tumor removal Right ~ 1976    "arm; had to take bone left hip to add to the repair"  . Bone graft hip iliac crest Left ~ 1976    Family History  Problem Relation Age of Onset  . Diabetes Mother   . Breast cancer Mother   . Stroke Brother   . Neurofibromatosis Maternal Uncle    Social History:  reports that he has never smoked. He has never used smokeless tobacco. He reports that he drinks alcohol. He  reports that he does not use illicit drugs.  Allergies:  Allergies  Allergen Reactions  . Metformin And Related Diarrhea     (Not in a hospital admission)  ROS: Out of a complete 14 system review, the patient complains of only the following symptoms, and all other reviewed systems are negative. +dysarthria   Physical Examination: Filed Vitals:   09/14/15 1900 09/14/15 1901  BP: 153/86   Pulse: 77   Temp:  97.7 F (36.5 C)  Resp: 13    Physical Exam  Constitutional: He appears well-developed and well-nourished.  Psych: Affect appropriate to situation Eyes: No scleral injection HENT: No OP obstrucion Head: Normocephalic.  Cardiovascular: Normal rate and regular rhythm.  Respiratory: Effort normal and breath sounds normal.  GI: Soft. Bowel sounds are normal. No distension. There is no tenderness.  Skin: WDI  Neurologic Examination: Mental Status: Alert, oriented, thought content appropriate.  Speech fluent without evidence of aphasia.  Able to follow 3 step commands without difficulty. Cranial Nerves: II: optic discs not visualized, mild impairment in left lateral inferior quadrant though inconsistent, pupils equal, round, reactive to light  III,IV, VI: ptosis not present, extra-ocular motions intact bilaterally V,VII: mild asymmetry with apparent weakness on left , facial light touch sensation normal bilaterally VIII: hearing normal bilaterally IX,X: gag reflex present XI: trapezius strength/neck  flexion strength normal bilaterally XII: tongue strength normal  Motor: Right : Upper extremity    Left:     Upper extremity 5/5 deltoid       5/5 deltoid 5/5 biceps      5/5 biceps  5/5 triceps      5/5 triceps 5/5 hand grip      5/5 hand grip  Lower extremity     Lower extremity 5/5 hip flexor      5/5 hip flexor 5/5 quadricep      5/5 quadriceps  5/5 hamstrings     5/5 hamstrings 5/5 plantar flexion       5/5 plantar flexion 5/5 plantar extension     5/5  plantar extension Tone and bulk:normal tone throughout; no atrophy noted Sensory: Pinprick and light touch intact throughout, bilaterally Deep Tendon Reflexes: 1+ and symmetric throughout Plantars: Right: downgoing   Left: downgoing Cerebellar: normal finger-to-nose, and normal heel-to-shin test Gait: deferred  Laboratory Studies:   Basic Metabolic Panel:  Recent Labs Lab 09/14/15 1755 09/14/15 1812  NA 140 140  K 3.2* 3.2*  CL 101 100*  CO2 29  --   GLUCOSE 235* 228*  BUN 14 16  CREATININE 1.95* 1.70*  CALCIUM 9.3  --     Liver Function Tests:  Recent Labs Lab 09/14/15 1755  AST 17  ALT 14*  ALKPHOS 57  BILITOT 0.8  PROT 6.0*  ALBUMIN 3.0*   No results for input(s): LIPASE, AMYLASE in the last 168 hours. No results for input(s): AMMONIA in the last 168 hours.  CBC:  Recent Labs Lab 09/14/15 1755 09/14/15 1812  WBC 4.8  --   NEUTROABS 3.5  --   HGB 11.8* 12.2*  HCT 34.4* 36.0*  MCV 87.3  --   PLT 161  --     Cardiac Enzymes: No results for input(s): CKTOTAL, CKMB, CKMBINDEX, TROPONINI in the last 168 hours.  BNP: Invalid input(s): POCBNP  CBG: No results for input(s): GLUCAP in the last 168 hours.  Microbiology: Results for orders placed or performed during the hospital encounter of 02/20/13  Urine culture     Status: None   Collection Time: 02/20/13  7:20 PM  Result Value Ref Range Status   Specimen Description URINE, CLEAN CATCH  Final   Special Requests NONE  Final   Culture  Setup Time 02/20/2013 20:48  Final   Colony Count NO GROWTH  Final   Culture NO GROWTH  Final   Report Status 02/21/2013 FINAL  Final    Coagulation Studies:  Recent Labs  09/14/15 1755  LABPROT 15.6*  INR 1.22    Urinalysis: No results for input(s): COLORURINE, LABSPEC, PHURINE, GLUCOSEU, HGBUR, BILIRUBINUR, KETONESUR, PROTEINUR, UROBILINOGEN, NITRITE, LEUKOCYTESUR in the last 168 hours.  Invalid input(s): APPERANCEUR  Lipid Panel:     Component  Value Date/Time   CHOL 134 02/21/2013 0520   TRIG 90 02/21/2013 0520   HDL 61 02/21/2013 0520   CHOLHDL 2.2 02/21/2013 0520   VLDL 18 02/21/2013 0520   LDLCALC 55 02/21/2013 0520    HgbA1C:  Lab Results  Component Value Date   HGBA1C 7.5* 02/21/2013    Urine Drug Screen:  No results found for: LABOPIA, COCAINSCRNUR, LABBENZ, AMPHETMU, THCU, LABBARB  Alcohol Level: No results for input(s): ETH in the last 168 hours.  Other results: Imaging: Ct Head Wo Contrast  09/14/2015  CLINICAL DATA:  Episode of slurred speech this evening lasting 1 hour, now resolved. EXAM: CT HEAD WITHOUT CONTRAST  TECHNIQUE: Contiguous axial images were obtained from the base of the skull through the vertex without intravenous contrast. COMPARISON:  03/17/2014 FINDINGS: Skull and Sinuses:Negative for fracture or destructive process. The visualized mastoids, middle ears, and imaged paranasal sinuses are clear. Visualized orbits: Bilateral cataract resection. Brain: No evidence of acute infarction, hemorrhage, hydrocephalus, or mass lesion/mass effect. Patchy cerebral white matter disease, which is mild for age, is underestimated relative to brain MRI from 2014. This is consistent with chronic small vessel ischemia in this patient with history of hypertension and diabetes. IMPRESSION: 1. No acute finding. 2. Chronic small vessel disease, mild. Electronically Signed   By: Monte Fantasia M.D.   On: 09/14/2015 17:58    Assessment: 70 y.o. male with hx of HTN, prior CVA, diabetic retinopathy (left eye) presenting with transient episode of blurred vision, slurred speech and left-sided facial droop. CT head imaging reviewed, no acute process noted. Will be admitted for TIA workup.    Plan: 1. HgbA1c, fasting lipid panel 2. MRI, MRA  of the brain without contrast 3. PT consult, OT consult, Speech consult 4. Echocardiogram 5. Carotid dopplers 6. Prophylactic therapy-continue Plavix 75mg  daily 7. Risk factor  modification 8. Telemetry monitoring 9. Frequent neuro checks 10. NPO until RN stroke swallow screen   Jim Like, DO [facility] [phone]  If 7pm- 7am, please page neurology on call as listed in [place]. 09/14/2015, 7:37 PM

## 2015-09-14 NOTE — ED Notes (Signed)
Pt transported to MRI 

## 2015-09-14 NOTE — ED Notes (Signed)
Per EMS, Pt was at [employer] with his family when they started to notice blurred vision. Family called EMS, pt presented with left sided facial droop, weakness, and slurred speech. Pt's symptoms resolved during transport. Pt has HX of HTN, High cholesterol, and Stroke with right sided effect. Pt is alert and oriented x4 upon arrival to the ED.

## 2015-09-15 ENCOUNTER — Observation Stay (HOSPITAL_BASED_OUTPATIENT_CLINIC_OR_DEPARTMENT_OTHER): Payer: Medicare Other

## 2015-09-15 ENCOUNTER — Encounter (HOSPITAL_COMMUNITY): Payer: Self-pay | Admitting: Student

## 2015-09-15 DIAGNOSIS — I10 Essential (primary) hypertension: Secondary | ICD-10-CM | POA: Diagnosis not present

## 2015-09-15 DIAGNOSIS — G459 Transient cerebral ischemic attack, unspecified: Secondary | ICD-10-CM

## 2015-09-15 DIAGNOSIS — N183 Chronic kidney disease, stage 3 (moderate): Secondary | ICD-10-CM

## 2015-09-15 DIAGNOSIS — E1122 Type 2 diabetes mellitus with diabetic chronic kidney disease: Secondary | ICD-10-CM | POA: Diagnosis not present

## 2015-09-15 DIAGNOSIS — Z794 Long term (current) use of insulin: Secondary | ICD-10-CM

## 2015-09-15 DIAGNOSIS — E0801 Diabetes mellitus due to underlying condition with hyperosmolarity with coma: Secondary | ICD-10-CM

## 2015-09-15 DIAGNOSIS — G451 Carotid artery syndrome (hemispheric): Secondary | ICD-10-CM

## 2015-09-15 LAB — GLUCOSE, CAPILLARY
GLUCOSE-CAPILLARY: 236 mg/dL — AB (ref 65–99)
Glucose-Capillary: 201 mg/dL — ABNORMAL HIGH (ref 65–99)
Glucose-Capillary: 120 mg/dL — ABNORMAL HIGH (ref 65–99)
Glucose-Capillary: 186 mg/dL — ABNORMAL HIGH (ref 65–99)

## 2015-09-15 LAB — LIPID PANEL
Cholesterol: 215 mg/dL — ABNORMAL HIGH (ref 0–200)
HDL: 58 mg/dL (ref >40)
LDL CALC: 137 mg/dL — AB (ref 0–99)
Total CHOL/HDL Ratio: 3.7 RATIO
Triglycerides: 99 mg/dL (ref <150)
VLDL: 20 mg/dL (ref 0–40)

## 2015-09-15 LAB — TROPONIN I: Troponin I: 0.03 ng/mL (ref <0.031)

## 2015-09-15 MED ORDER — NIACIN 100 MG PO TABS
100.0000 mg | ORAL_TABLET | Freq: Every day | ORAL | Status: DC
  Administered 2015-09-15: 100 mg via ORAL
  Filled 2015-09-15 (×2): qty 1

## 2015-09-15 MED ORDER — POTASSIUM CHLORIDE CRYS ER 20 MEQ PO TBCR
40.0000 meq | EXTENDED_RELEASE_TABLET | ORAL | Status: AC
  Administered 2015-09-15 (×2): 40 meq via ORAL
  Filled 2015-09-15: qty 2

## 2015-09-15 NOTE — Evaluation (Signed)
Physical Therapy Evaluation Patient Details Name: Cody Silva MRN: [id number] DOB: 07/14/1946 Today's Date: 09/15/2015   History of Present Illness  Pt is a 70 y/o male who presents s/p episode of blurred vision, slurred speech and L facial droop. Symptoms had resolved by the time pt presented to the ED. MRI was negative for acute changes.  Clinical Impression  Pt admitted with above diagnosis. Pt currently with functional limitations due to the deficits listed below (see PT Problem List). At the time of PT eval pt was able to perform transfers and ambulation with no physical assistance required. Pt reports he feels 75% back to baseline and just feels weak overall. Strength testing revealed no differences R vs. L. Will keep on acute caseload to maximize function prior to d/c. Pt will benefit from skilled PT to increase their independence and safety with mobility to allow discharge to the venue listed below.     Follow Up Recommendations Home health PT;Supervision for mobility/OOB    Equipment Recommendations  None recommended by PT    Recommendations for Other Services       Precautions / Restrictions Precautions Precautions: Fall Restrictions Weight Bearing Restrictions: No      Mobility  Bed Mobility               General bed mobility comments: Pt received standing at the sink washing hands  Transfers Overall transfer level: Modified independent Equipment used: None Transfers: Sit to/from Stand              Ambulation/Gait Ambulation/Gait assistance: Supervision Ambulation Distance (Feet): 250 Feet Assistive device: None Gait Pattern/deviations: Step-through pattern;Decreased stride length Gait velocity: Decreased Gait velocity interpretation: Below normal speed for age/gender General Gait Details: Pt appears guarded at times. States he feels lightheaded. No physical assist required however close guard for safety was provided.   Stairs Stairs:  Yes Stairs assistance: Min guard Stair Management: One rail Left;Step to pattern;Sideways;Alternating pattern;Forwards Number of Stairs: 9 General stair comments: Practiced multiple ways for pt to negotiate stairs at home since he has had falls in the past.   Wheelchair Mobility    Modified Rankin (Stroke Patients Only)       Balance Overall balance assessment: Needs assistance Sitting-balance support: Feet supported;No upper extremity supported Sitting balance-Leahy Scale: Fair     Standing balance support: No upper extremity supported Standing balance-Leahy Scale: Fair                               Pertinent Vitals/Pain Pain Assessment: Faces Faces Pain Scale: Hurts a little bit Pain Location: Headache Pain Intervention(s): Monitored during session;Repositioned    Home Living Family/patient expects to be discharged to:: Private residence Living Arrangements: Spouse/significant other Available Help at Discharge: Family;Available 24 hours/day Type of Home: House Home Access: Level entry     Home Layout: Two level;Bed/bath upstairs;1/2 bath on main level Home Equipment: Walker - 2 wheels;Cane - single point      Prior Function Level of Independence: Independent         Comments: Reports falls on the stairs at home     Hand Dominance   Dominant Hand: Right    Extremity/Trunk Assessment   Upper Extremity Assessment: Defer to OT evaluation           Lower Extremity Assessment: Generalized weakness (Grossly 4/5 bilaterally)      Cervical / Trunk Assessment: Other exceptions  Communication   Communication: No  difficulties  Cognition Arousal/Alertness: Awake/alert Behavior During Therapy: WFL for tasks assessed/performed Overall Cognitive Status: Within Functional Limits for tasks assessed                      General Comments      Exercises        Assessment/Plan    PT Assessment Patient needs continued PT services   PT Diagnosis Difficulty walking   PT Problem List Decreased strength;Decreased range of motion;Decreased activity tolerance;Decreased balance;Decreased mobility;Decreased knowledge of use of DME;Decreased safety awareness;Decreased knowledge of precautions  PT Treatment Interventions DME instruction;Gait training;Stair training;Functional mobility training;Therapeutic activities;Therapeutic exercise;Neuromuscular re-education;Patient/family education   PT Goals (Current goals can be found in the Care Plan section) Acute Rehab PT Goals Patient Stated Goal: Not fall on the stairs anymore PT Goal Formulation: With patient/family Time For Goal Achievement: 09/22/15 Potential to Achieve Goals: Good    Frequency Min 3X/week   Barriers to discharge        Co-evaluation               End of Session Equipment Utilized During Treatment: Gait belt Activity Tolerance: Treatment limited secondary to medical complications (Comment) (Lightheadedness) Patient left: in chair;with call bell/phone within reach;with family/visitor present Nurse Communication: Mobility status    Functional Assessment Tool Used: Clinical judgement Functional Limitation: Mobility: Walking and moving around Mobility: Walking and Moving Around Current Status [phone]): At least 1 percent but less than 20 percent impaired, limited or restricted Mobility: Walking and Moving Around Goal Status [phone]): At least 1 percent but less than 20 percent impaired, limited or restricted    Time: 1349-1419 PT Time Calculation (min) (ACUTE ONLY): 30 min   Charges:   PT Evaluation $PT Eval Moderate Complexity: 1 Procedure     PT G Codes:   PT G-Codes **NOT FOR INPATIENT CLASS** Functional Assessment Tool Used: Clinical judgement Functional Limitation: Mobility: Walking and moving around Mobility: Walking and Moving Around Current Status [id number]): At least 1 percent but less than 20 percent impaired, limited or  restricted Mobility: Walking and Moving Around Goal Status [phone]): At least 1 percent but less than 20 percent impaired, limited or restricted    Rolinda Roan 09/15/2015, 3:03 PM  Rolinda Roan, PT, DPT Acute Rehabilitation Services Pager: [phone]

## 2015-09-15 NOTE — Progress Notes (Signed)
*  PRELIMINARY RESULTS* Vascular Ultrasound Carotid Duplex (Doppler) has been completed.  Preliminary findings: Bilateral: No significant (1-39%) ICA stenosis. Antegrade vertebral flow.   Landry Mellow, RDMS, RVT  09/15/2015, 11:02 AM

## 2015-09-15 NOTE — Care Management Note (Addendum)
Case Management Note  Patient Details  Name: [name] MRN: [id number] Date of Birth: [birth year]  Subjective/Objective:                    Action/Plan: Patient was admitted with TIA. Lives at home with spouse. Patient DOES have a PCP, Dr Milinda Pointer with the [facility]. Will follow for discharge needs pending PT/OT evals and physician orders.  Expected Discharge Date:                  Expected Discharge Plan:  [place]  In-House Referral:     Discharge planning Services     Post Acute Care Choice:    Choice offered to:     DME Arranged:    DME Agency:     HH Arranged:    [place] Agency:     Status of Service:  In process, will continue to follow  Medicare Important Message Given:    Date Medicare IM Given:    Medicare IM give by:    Date Additional Medicare IM Given:    Additional Medicare Important Message give by:     If discussed at [place] of Stay Meetings, dates discussed:    Additional CommentsRolm Baptise, RN 09/15/2015, 3:24 PM [phone]

## 2015-09-15 NOTE — Progress Notes (Signed)
Patient Demographics:    Cody Silva, is a 70 y.o. male, DOB - February 19, 1946, [id number]  Admit date - 09/14/2015   Admitting Physician Etta Quill, DO  Outpatient Primary MD for the patient is Pcp Not In System  LOS -    Chief Complaint  Patient presents with  . Transient Ischemic Attack        Subjective:    Cody Silva today has, No headache, No chest pain, No abdominal pain - No Nausea, No new weakness tingling or numbness, No Cough - SOB.    Assessment  & Plan :     1. Presyncopal episode. Initially thought to be TIA, history of CVA. Seen by neurology, MRI nonacute, per neurology and this likely was a cardiac event. Full stroke workup underway, his symptoms have resolved.   2. Previous history of CVA. On Plavix and statin for secondary prevention.   3. Chronic Diastolic CHF. Last EF 55%. Compensated. Continue beta blocker.   4. CK D stage III. Baseline creatinine around 1.8. Monitor. No acute issues.   5. Dyslipidemia. LDL 137, already on Zocor high dose, add niacin.   6. Essential hypertension. On [place] and Coreg continue.   7. DM type II. Check A1c. Continue Lantus and sliding scale.  Lab Results  Component Value Date   HGBA1C 7.5* 02/21/2013    CBG (last 3)   Recent Labs  09/14/15 2239 09/15/15 0643 09/15/15 1150  GLUCAP 153* 201* 120*      Code Status : Full  Family Communication  : None  Disposition Plan  : Home in the morning  Consults  :  Neurology, cardiology  Procedures  :   MRI brain no acute stroke  Pending echogram and carotid duplex  DVT Prophylaxis  :   Heparin   Lab Results  Component Value Date   PLT 161 09/14/2015    Inpatient Medications  Scheduled Meds: . carvedilol  25 mg Oral BID WC  . citalopram  20 mg  Oral Daily  . [START ON 09/21/2015] cloNIDine  0.3 mg Transdermal Weekly  . clopidogrel  75 mg Oral QHS  . heparin  5,000 Units Subcutaneous 3 times per day  . insulin aspart  0-9 Units Subcutaneous TID WC  . insulin aspart  4 Units Subcutaneous TID WC  . insulin glargine  30 Units Subcutaneous QHS  . latanoprost  1 drop Both Eyes QHS  . losartan  100 mg Oral Daily  . potassium chloride  40 mEq Oral Q4H  . simvastatin  40 mg Oral QPM  . spironolactone  25 mg Oral Daily   Continuous Infusions:  PRN Meds:.acetaminophen, albuterol, fluticasone, gabapentin, polyvinyl alcohol  Antibiotics  :     Anti-infectives    None        Objective:   Filed Vitals:   09/14/15 2353 09/15/15 0200 09/15/15 0400 09/15/15 0600  BP: 182/94 172/89 170/93 169/89  Pulse: 82 86 79 65  Temp: 97.9 F (36.6 C) 97.8 F (36.6 C) 98.2 F (36.8 C) 98 F (36.7 C)  TempSrc: Oral Oral Oral Oral  Resp: 16 16 16 14   Height:      Weight:      SpO2: 100% 100% 99% 100%  Wt Readings from Last 3 Encounters:  09/14/15 97.098 kg (214 lb 1 oz)  03/22/15 107.729 kg (237 lb 8 oz)  11/02/14 102.059 kg (225 lb)    No intake or output data in the 24 hours ending 09/15/15 1236   Physical Exam  Awake Alert, Oriented X 3, No new F.N deficits, Chr. R sided weakness, Normal affect [place].AT,PERRAL Supple Neck,No JVD, No cervical lymphadenopathy appriciated.  Symmetrical Chest wall movement, Good air movement bilaterally, CTAB RRR,No Gallops,Rubs or new Murmurs, No Parasternal Heave +ve B.Sounds, Abd Soft, No tenderness, No organomegaly appriciated, No rebound - guarding or rigidity. No Cyanosis, Clubbing or edema, No new Rash or bruise      Data Review:   Micro Results No results found for this or any previous visit (from the past 240 hour(s)).  Radiology Reports Ct Head Wo Contrast  09/14/2015  CLINICAL DATA:  Episode of slurred speech this evening lasting 1 hour, now resolved. EXAM: CT HEAD WITHOUT CONTRAST  TECHNIQUE: Contiguous axial images were obtained from the base of the skull through the vertex without intravenous contrast. COMPARISON:  03/17/2014 FINDINGS: Skull and Sinuses:Negative for fracture or destructive process. The visualized mastoids, middle ears, and imaged paranasal sinuses are clear. Visualized orbits: Bilateral cataract resection. Brain: No evidence of acute infarction, hemorrhage, hydrocephalus, or mass lesion/mass effect. Patchy cerebral white matter disease, which is mild for age, is underestimated relative to brain MRI from 2014. This is consistent with chronic small vessel ischemia in this patient with history of hypertension and diabetes. IMPRESSION: 1. No acute finding. 2. Chronic small vessel disease, mild. Electronically Signed   By: Monte Fantasia M.D.   On: 09/14/2015 17:58   Mr Brain Wo Contrast  09/14/2015  CLINICAL DATA:  Initial evaluation for transient slurred speech, blurry vision, left-sided facial droop. EXAM: MRI HEAD WITHOUT CONTRAST MRA HEAD WITHOUT CONTRAST TECHNIQUE: Multiplanar, multiecho pulse sequences of the brain and surrounding structures were obtained without intravenous contrast. Angiographic images of the head were obtained using MRA technique without contrast. COMPARISON:  Prior CT from earlier the same day. FINDINGS: MRI HEAD FINDINGS Diffuse prominence of the CSF containing spaces is compatible with generalized age-related cerebral atrophy. Mild patchy and confluent T2/FLAIR hyperintensity within the periventricular white matter most consistent with chronic small vessel ischemic disease, mild in nature. No abnormal foci of restricted diffusion to suggest acute intracranial infarct. Major intracranial vascular flow voids are maintained. Gray-white matter differentiation maintained. No acute intracranial hemorrhage. Small chronic micro hemorrhage noted within the left lentiform nucleus. No other chronic hemorrhage. No mass lesion, midline shift, or mass effect.  No hydrocephalus. No extra-axial fluid collection. Craniocervical junction is normal. Pituitary gland within normal limits. No acute abnormality about the orbits. Mild exophthalmos. Sequela prior bilateral lens extraction noted. Paranasal sinuses are clear. Trace opacity within the inferior right mastoid air cells. Mastoid air cells are otherwise clear. Inner ear structures grossly normal. Bone marrow signal intensity normal. No scalp soft tissue abnormality. MRA HEAD FINDINGS ANTERIOR CIRCULATION: Visualized distal cervical segments of the internal carotid arteries are widely patent with antegrade flow. Petrous, cavernous, and supraclinoid segments widely patent. Right A1 segment is hypoplastic, likely accounting for the slight caliber difference within the ICAs (right smaller than left). Left A1 segment widely patent. Anterior communicating artery normal. Anterior cerebral arteries well opacified. M1 segments widely patent without stenosis or occlusion. MCA bifurcations normal. No proximal M2 branch stenosis or occlusion. Distal MCA branches well opacified and symmetric. POSTERIOR CIRCULATION: Vertebral arteries patent to the  vertebrobasilar junction. Right vertebral artery is dominant. Posterior inferior cerebral arteries patent bilaterally. Focal irregular mild to moderate stenosis within the mid basilar artery (series 5, image 87). The basilar artery is well opacified distally. Superior cerebral arteries patent bilaterally but demonstrate multi focal atheromatous irregularity. Left P1 segment is diminutive and hypoplastic with a prominent left posterior communicating artery. Left PCA demonstrates multifocal atheromatous irregularity but is well opacified to its distal aspect. Right P1 segment widely patent. Focal short-segment moderate stenosis within the proximal right P2 segment (series 506, image 11). Right PCA is well opacified distally. Distal branch atheromatous irregularity within the right PCA itself.  No aneurysm or vascular malformation. IMPRESSION: MRI HEAD IMPRESSION: 1. No acute intracranial infarct or other process identified. 2. Mild age-related cerebral atrophy with an chronic small vessel ischemic disease. MRA HEAD IMPRESSION: 1. No large or proximal arterial branch occlusion within the intracranial circulation. 2. 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 No other correctable or high-grade stenosis within the intracranial circulation. 3. Small vessel atheromatous irregularity within the SCAs ans PCAs bilaterally. Electronically Signed   By: Jeannine Boga M.D.   On: 09/14/2015 21:38   Mr Jodene Nam Head/brain Wo Cm  09/14/2015  CLINICAL DATA:  Initial evaluation for transient slurred speech, blurry vision, left-sided facial droop. EXAM: MRI HEAD WITHOUT CONTRAST MRA HEAD WITHOUT CONTRAST TECHNIQUE: Multiplanar, multiecho pulse sequences of the brain and surrounding structures were obtained without intravenous contrast. Angiographic images of the head were obtained using MRA technique without contrast. COMPARISON:  Prior CT from earlier the same day. FINDINGS: MRI HEAD FINDINGS Diffuse prominence of the CSF containing spaces is compatible with generalized age-related cerebral atrophy. Mild patchy and confluent T2/FLAIR hyperintensity within the periventricular white matter most consistent with chronic small vessel ischemic disease, mild in nature. No abnormal foci of restricted diffusion to suggest acute intracranial infarct. Major intracranial vascular flow voids are maintained. Gray-white matter differentiation maintained. No acute intracranial hemorrhage. Small chronic micro hemorrhage noted within the left lentiform nucleus. No other chronic hemorrhage. No mass lesion, midline shift, or mass effect. No hydrocephalus. No extra-axial fluid collection. Craniocervical junction is normal. Pituitary gland within normal limits. No acute abnormality about the orbits. Mild  exophthalmos. Sequela prior bilateral lens extraction noted. Paranasal sinuses are clear. Trace opacity within the inferior right mastoid air cells. Mastoid air cells are otherwise clear. Inner ear structures grossly normal. Bone marrow signal intensity normal. No scalp soft tissue abnormality. MRA HEAD FINDINGS ANTERIOR CIRCULATION: Visualized distal cervical segments of the internal carotid arteries are widely patent with antegrade flow. Petrous, cavernous, and supraclinoid segments widely patent. Right A1 segment is hypoplastic, likely accounting for the slight caliber difference within the ICAs (right smaller than left). Left A1 segment widely patent. Anterior communicating artery normal. Anterior cerebral arteries well opacified. M1 segments widely patent without stenosis or occlusion. MCA bifurcations normal. No proximal M2 branch stenosis or occlusion. Distal MCA branches well opacified and symmetric. POSTERIOR CIRCULATION: Vertebral arteries patent to the vertebrobasilar junction. Right vertebral artery is dominant. Posterior inferior cerebral arteries patent bilaterally. Focal irregular mild to moderate stenosis within the mid basilar artery (series 5, image 87). The basilar artery is well opacified distally. Superior cerebral arteries patent bilaterally but demonstrate multi focal atheromatous irregularity. Left P1 segment is diminutive and hypoplastic with a prominent left posterior communicating artery. Left PCA demonstrates multifocal atheromatous irregularity but is well opacified to its distal aspect. Right P1 segment widely patent. Focal short-segment moderate stenosis within the  proximal right P2 segment (series 506, image 11). Right PCA is well opacified distally. Distal branch atheromatous irregularity within the right PCA itself. No aneurysm or vascular malformation. IMPRESSION: MRI HEAD IMPRESSION: 1. No acute intracranial infarct or other process identified. 2. Mild age-related cerebral atrophy  with an chronic small vessel ischemic disease. MRA HEAD IMPRESSION: 1. No large or proximal arterial branch occlusion within the intracranial circulation. 2. 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 No other correctable or high-grade stenosis within the intracranial circulation. 3. Small vessel atheromatous irregularity within the SCAs ans PCAs bilaterally. Electronically Signed   By: Jeannine Boga M.D.   On: 09/14/2015 21:38     CBC  Recent Labs Lab 09/14/15 1755 09/14/15 1812  WBC 4.8  --   HGB 11.8* 12.2*  HCT 34.4* 36.0*  PLT 161  --   MCV 87.3  --   MCH 29.9  --   MCHC 34.3  --   RDW 13.2  --   LYMPHSABS 0.9  --   MONOABS 0.3  --   EOSABS 0.1  --   BASOSABS 0.1  --     Chemistries   Recent Labs Lab 09/14/15 1755 09/14/15 1812  NA 140 140  K 3.2* 3.2*  CL 101 100*  CO2 29  --   GLUCOSE 235* 228*  BUN 14 16  CREATININE 1.95* 1.70*  CALCIUM 9.3  --   AST 17  --   ALT 14*  --   ALKPHOS 57  --   BILITOT 0.8  --    ------------------------------------------------------------------------------------------------------------------  Recent Labs  09/15/15 0523  CHOL 215*  HDL 58  LDLCALC 137*  TRIG 99  CHOLHDL 3.7    Lab Results  Component Value Date   HGBA1C 7.5* 02/21/2013   ------------------------------------------------------------------------------------------------------------------ No results for input(s): TSH, T4TOTAL, T3FREE, THYROIDAB in the last 72 hours.  Invalid input(s): FREET3 ------------------------------------------------------------------------------------------------------------------ No results for input(s): VITAMINB12, FOLATE, FERRITIN, TIBC, IRON, RETICCTPCT in the last 72 hours.  Coagulation profile  Recent Labs Lab 09/14/15 1755  INR 1.22    No results for input(s): DDIMER in the last 72 hours.  Cardiac Enzymes No results for input(s): CKMB, TROPONINI, MYOGLOBIN in the last 168  hours.  Invalid input(s): CK ------------------------------------------------------------------------------------------------------------------ Invalid input(s): POCBNP   Time Spent in minutes   35   [name] K M.D on 09/15/2015 at 12:36 PM  Between 7am to 7pm - Pager - [phone]  After 7pm go to [email or web address] - password [facility]  [facility] -  Office  [phone]

## 2015-09-15 NOTE — Progress Notes (Signed)
Speech Pathology:  Pt passed RN stroke swallow screen, therefore formal SLP swallow eval not necessary per protocol.  Will sign off.  Brailen Macneal L. Tivis Ringer, [place] CCC/SLP Pager [phone]

## 2015-09-15 NOTE — Progress Notes (Signed)
STROKE TEAM PROGRESS NOTE   HISTORY Cody Silva is an 70 y.o. male hx of HTN, prior CVA presenting with transient episode of blurred vision, slurred speech and left-sided facial droop. Blurred vision described as curtain blocking certain parts of his vision. Per family, he was eating with LSW at 1600 09/14/2015 when he had sudden onset of these symptoms. Symptoms improved upon arrival to ED though there is a question of continued facial droop. He takes Plavix 75mg  daily at home. CT head imaging reviewed, no acute process noted. Modified Rankin: Rankin Score=1. Patient was not administered TPA secondary to symptoms resolved, out of tPA window at time of evaluation. He was admitted for further evaluation and treatment.   SUBJECTIVE (INTERVAL HISTORY) His wufe is at the bedside.  He reports the event started at [employer] yesterday while he was trying to cut pancakes. His daughter told him his mouth was twisted. Episode lasted for 15 mins - starting about 4p. Said his vision was black. States he felt his whole body was weak. He denied CP. Reported diaphoresis with them taking his shirt off to wipe him down. BP ok. On way to hospital he improved. Overall he feels his condition is completely resolved.    OBJECTIVE Temp:  [97.7 F (36.5 C)-98.2 F (36.8 C)] 98 F (36.7 C) (01/11 0600) Pulse Rate:  [60-86] 65 (01/11 0600) Cardiac Rhythm:  [-] Normal sinus rhythm (01/11 0700) Resp:  [10-18] 14 (01/11 0600) BP: (130-182)/(70-94) 169/89 mmHg (01/11 0600) SpO2:  [99 %-100 %] 100 % (01/11 0600) Weight:  [97.098 kg (214 lb 1 oz)] 97.098 kg (214 lb 1 oz) (01/10 2155)  CBC:   Recent Labs Lab 09/14/15 1755 09/14/15 1812  WBC 4.8  --   NEUTROABS 3.5  --   HGB 11.8* 12.2*  HCT 34.4* 36.0*  MCV 87.3  --   PLT 161  --     Basic Metabolic Panel:   Recent Labs Lab 09/14/15 1755 09/14/15 1812  NA 140 140  K 3.2* 3.2*  CL 101 100*  CO2 29  --   GLUCOSE 235* 228*  BUN 14 16   CREATININE 1.95* 1.70*  CALCIUM 9.3  --     Lipid Panel:     Component Value Date/Time   CHOL 215* 09/15/2015 0523   TRIG 99 09/15/2015 0523   HDL 58 09/15/2015 0523   CHOLHDL 3.7 09/15/2015 0523   VLDL 20 09/15/2015 0523   LDLCALC 137* 09/15/2015 0523   HgbA1c:  Lab Results  Component Value Date   HGBA1C 7.5* 02/21/2013   Urine Drug Screen:     Component Value Date/Time   LABOPIA NONE DETECTED 09/14/2015 1923   COCAINSCRNUR NONE DETECTED 09/14/2015 1923   LABBENZ NONE DETECTED 09/14/2015 1923   AMPHETMU NONE DETECTED 09/14/2015 1923   THCU NONE DETECTED 09/14/2015 1923   LABBARB NONE DETECTED 09/14/2015 1923      IMAGING  Ct Head Wo Contrast 09/14/2015   1. No acute finding. 2. Chronic small vessel disease, mild.   MRI HEAD  09/14/2015  1. No acute intracranial infarct or other process identified. 2. Mild age-related cerebral atrophy with an chronic small vessel ischemic disease.  MRA HEAD  09/14/2015  1. No large or proximal arterial branch occlusion within the intracranial circulation. 2. 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 No other correctable or high-grade stenosis within the intracranial circulation. 3. Small vessel atheromatous irregularity within the SCAs ans PCAs bilaterally.   Carotid  Doppler   There is 1-39% bilateral ICA stenosis. Vertebral artery flow is antegrade.      PHYSICAL EXAM Pleasant middle-aged African-American male currently not in distress. . Afebrile. Head is nontraumatic. Neck is supple without bruit.    Cardiac exam no murmur or gallop. Lungs are clear to auscultation. Distal pulses are well felt. Neurologic Exam  Mental Status: Awake and fully alert. Oriented to place and time. Attention span, concentration and fund of knowledge appropriate. Mood and affect appropriate. Mini-Mental status exam scored 28/30 with deficits in attention and calculation only. Animal naming test 12. Cranial Nerves: Pupils  equal, briskly reactive to light. Extraocular movements full without nystagmus. Visual fields full to confrontation. Hearing intact. Facial sensation intact. Face, tongue, palate moves normally and symmetrically.  Motor: Normal bulk and tone. Normal strength in all tested extremity muscles.diminished fine finger movements on left and orbits right over left upper extremity. Mild weakness of the right thumb and index muscles with some wasting of the first  interosseous space Sensory.: intact to touch and pinprick and vibratory.  Coordination: Rapid alternating movements normal in all extremities. Finger-to-nose and heel-to-shin performed accurately bilaterally.  Gait and Station: Arises from chair without difficulty. Stance is normal. Gait demonstrates normal stride length, with some stiffness of the right leg. Poor arm swing on the left. Unable to heel, toe walk without difficulty. Tandem unsteady. Reflexes: 1+ and symmetric. Toes downgoing.  ASSESSMENT/PLAN Mr. Cody Silva is a 70 y.o. male with history of HTN and prior CVA presenting with transient episode of blurred vision, slurred speech and left-sided facial droop. He did not receive IV t-PA due to symptoms resolved, out of tPA window at time of evaluation.   Syncope. Doubt TIA  Resultant  Neuro deficits resolved  MRI  No acute stroke  MRA  No large vessel stenosis  Carotid Doppler  No significant stenosis   2D Echo  pending   LDL 137  HgbA1c pending  Heparin 5000 units sq tid for VTE prophylaxis Diet Carb Modified Fluid consistency:: Thin; Room service appropriate?: Yes  clopidogrel 75 mg daily prior to admission, now on clopidogrel 75 mg daily . Continue at discharge  Therapy recommendations:  pending   Disposition:  pending   Hypertension  Increasing since admission  Hyperlipidemia  Home meds:  zocor 40, resumed in hospital  LDL 137, goal < 70  Patient states he was not taking routinely  enoucouraged pt  to take daily  Continue statin at discharge  Diabetes Diabetic Neuropathy Diabetic Retinopathy  HgbA1c pending , goal < 7.0  Other Stroke Risk Factors  Advanced age  ETOH use  Hx stroke/TIA  2014 - bilateral corpus callosal infarcts due to stenosis of median artery of corpus callosumBilateral (large vessel disease) - resultant poor balance, unable to work as a Warden/ranger  Family hx stroke (brother)  HA 3x wk  Obstructive sleep apnea, on CPAP at home  Other Active Problems  CKD stage 3  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 I have made any additions or clarifications directly to the above note. Agree with note above. Transient episode of blurred vision, facial  droop and speech difficulties in the setting of generalized sweating and weakness possible presyncopal episode versus TIA. He remains at risk for recurrent stroke, TIA, neurological worsening and needs ongoing evaluation. Would also recommend cardiac evaluation given prior history of carotid disease. Discussion with patient and wife at the bedside and answered questions.  Antony Contras, MD Medical Director [facility] Pager: [phone] 09/15/2015 2:41 PM    To contact Stroke Continuity provider, please refer to [email or web address] After hours, contact General Neurology

## 2015-09-15 NOTE — Consult Note (Signed)
CARDIOLOGY CONSULT NOTE   Patient ID: Cody Silva MRN: [id number], DOB/AGE: [birth year]   Admit date: 09/14/2015 Date of Consult: 09/15/2015 Reason for Consult: Syncope   Primary Physician: Pcp Not In System Primary Cardiologist: [place]  HPI: Cody Silva is a 70 y.o. male with past medical history of Type 2 DM, HTN, OSA, Stage 3 CKD and prior CVA in 2014 who presented to [facility] ED on 09/14/2015 for a 20-minute episode of blurred vision, slurred speech and new-onset facial droop consistent with TIA.   The patient reports he was at [employer] with his family yesterday when his daughter noticed he was having difficulty cutting his food. She also noticed he has left-sided facial droop. He reported feeling very weak at that time and slightly lightheaded. His symptoms lasted for 15-20 minutes then resided by the time he arrived to the ED.   He denies any repeat symptoms since being admitted. Denies any recent chest pain, dyspnea at rest or with exertion, palpitations, syncope, nausea, or vomiting. Reports occasional lower extremity edema. No orthopnea or PND. He does see a Film/video editor at the [place] in [place]. Reports having a cardiac catheterization 4+ years ago with no significant findings. Denies any past stent placements or known cardiac arrhythmias.  Since admitted, he first two troponin values have been normal. CT Head and MRA showed no acute findings. LDL elevated at 137. Creatinine elevated at 1.95 (was 1.3 - 1.5 in 10/2014). Echo shows EF of 55% with no wall motion abnormalities. Carotid dopplers show 1-39% stenosis in the RICA and LICA which are patent with antegrade flow, consistent with previous studies two years ago. He has been on telemetry which shows no atopic events or arrhythmias.   Problem List Past Medical History  Diagnosis Date  . Hypertension   . Diabetic retinopathy ([place])   . Diabetic neuropathy ([place])   . Colon polyps   . History of blood transfusion  10/30/2014    hematochezia  . Anemia   . OSA on CPAP     "suppose to wear mask; I've got a call in for an equipment change" (10/30/2014)  . Type II diabetes mellitus ([place])   . Headache     "@ least 3 times/wk" (10/30/2014)  . Stroke [facility]) 2014    left extremity deficits; facial left  . Arthritis     "right arm, right ankle, right side" (10/30/2014)  . History of gout   . PTSD (post-traumatic stress disorder)     "service related"    Past Surgical History  Procedure Laterality Date  . Cataract extraction w/ intraocular lens  implant, bilateral Bilateral 2014-2015  . Tumor removal Right ~ 1976    "arm; had to take bone left hip to add to the repair"  . Bone graft hip iliac crest Left ~ 1976     Allergies Allergies  Allergen Reactions  . Metformin And Related Diarrhea      Inpatient Medications . carvedilol  25 mg Oral BID WC  . citalopram  20 mg Oral Daily  . [START ON 09/21/2015] cloNIDine  0.3 mg Transdermal Weekly  . clopidogrel  75 mg Oral QHS  . heparin  5,000 Units Subcutaneous 3 times per day  . insulin aspart  0-9 Units Subcutaneous TID WC  . insulin aspart  4 Units Subcutaneous TID WC  . insulin glargine  30 Units Subcutaneous QHS  . latanoprost  1 drop Both Eyes QHS  . losartan  100 mg Oral Daily  .  niacin  100 mg Oral QHS  . potassium chloride  40 mEq Oral Q4H  . simvastatin  40 mg Oral QPM  . spironolactone  25 mg Oral Daily    Family History Family History  Problem Relation Age of Onset  . Diabetes Mother   . Breast cancer Mother   . Stroke Brother   . Neurofibromatosis Maternal Uncle   . Heart attack Mother     CABG - Age 85     Social History Social History   Social History  . Marital Status: Married    Spouse Name: sheila  . Number of Children: 4  . Years of Education: 16   Occupational History  . Not on file.   Social History Main Topics  . Smoking status: Never Smoker   . Smokeless tobacco: Never Used  . Alcohol Use: 0.0 oz/week     0 Standard drinks or equivalent per week     Comment: 10/30/2014 "might have a beer a couple times/yr"  . Drug Use: No  . Sexual Activity: Yes   Other Topics Concern  . Not on file   Social History Narrative   Patient is married with 4 children   Patient is right handed   Patient has a Water quality scientist degree    Patient 4 cups daily     Review of Systems General:  No chills, fever, night sweats or weight changes.  Cardiovascular:  No chest pain, dyspnea on exertion, edema, orthopnea, palpitations, paroxysmal nocturnal dyspnea. Dermatological: No rash, lesions/masses Respiratory: No cough, dyspnea Urologic: No hematuria, dysuria Abdominal:   No nausea, vomiting, diarrhea, bright red blood per rectum, melena, or hematemesis Neurologic:  No changes in mental status. Positive for weakness, facial droop, and slurred speech. All other systems reviewed and are otherwise negative except as noted above.  Physical Exam Blood pressure 110/65, pulse 61, temperature 98.2 F (36.8 C), temperature source Oral, resp. rate 16, height 6\' 4"  (1.93 m), weight 214 lb 1 oz (97.098 kg), SpO2 100 %.  General: Pleasant, African American male appearing in NAD Psych: Normal affect. Neuro: Alert and oriented X 3. Moves all extremities spontaneously. HEENT: Normal  Neck: Supple without bruits or JVD. Lungs:  Resp regular and unlabored, CTA without wheezing or rales. Heart: RRR no s3, s4, or murmurs. Abdomen: Soft, non-tender, non-distended, BS + x 4.  Extremities: No clubbing, cyanosis or edema. DP/PT/Radials 2+ and equal bilaterally.  Labs  Recent Labs  09/15/15 1324  TROPONINI 0.03   Lab Results  Component Value Date   WBC 4.8 09/14/2015   HGB 12.2* 09/14/2015   HCT 36.0* 09/14/2015   MCV 87.3 09/14/2015   PLT 161 09/14/2015     Recent Labs Lab 09/14/15 1755 09/14/15 1812  NA 140 140  K 3.2* 3.2*  CL 101 100*  CO2 29  --   BUN 14 16  CREATININE 1.95* 1.70*  CALCIUM 9.3  --   PROT 6.0*   --   BILITOT 0.8  --   ALKPHOS 57  --   ALT 14*  --   AST 17  --   GLUCOSE 235* 228*   Lab Results  Component Value Date   CHOL 215* 09/15/2015   HDL 58 09/15/2015   LDLCALC 137* 09/15/2015   TRIG 99 09/15/2015    Radiology/Studies  Ct Head Wo Contrast: 09/14/2015  CLINICAL DATA:  Episode of slurred speech this evening lasting 1 hour, now resolved. EXAM: CT HEAD WITHOUT CONTRAST TECHNIQUE: Contiguous axial images were obtained  from the base of the skull through the vertex without intravenous contrast. COMPARISON:  03/17/2014 FINDINGS: Skull and Sinuses:Negative for fracture or destructive process. The visualized mastoids, middle ears, and imaged paranasal sinuses are clear. Visualized orbits: Bilateral cataract resection. Brain: No evidence of acute infarction, hemorrhage, hydrocephalus, or mass lesion/mass effect. Patchy cerebral white matter disease, which is mild for age, is underestimated relative to brain MRI from 2014. This is consistent with chronic small vessel ischemia in this patient with history of hypertension and diabetes. IMPRESSION: 1. No acute finding. 2. Chronic small vessel disease, mild. Electronically Signed   By: Monte Fantasia M.D.   On: 09/14/2015 17:58   Mr Jodene Nam Head/brain [id number] Cm: 09/14/2015  CLINICAL DATA:  Initial evaluation for transient slurred speech, blurry vision, left-sided facial droop. EXAM: MRI HEAD WITHOUT CONTRAST MRA HEAD WITHOUT CONTRAST TECHNIQUE: Multiplanar, multiecho pulse sequences of the brain and surrounding structures were obtained without intravenous contrast. Angiographic images of the head were obtained using MRA technique without contrast. COMPARISON:  Prior CT from earlier the same day. FINDINGS: MRI HEAD FINDINGS Diffuse prominence of the CSF containing spaces is compatible with generalized age-related cerebral atrophy. Mild patchy and confluent T2/FLAIR hyperintensity within the periventricular white matter most consistent with chronic small  vessel ischemic disease, mild in nature. No abnormal foci of restricted diffusion to suggest acute intracranial infarct. Major intracranial vascular flow voids are maintained. Gray-white matter differentiation maintained. No acute intracranial hemorrhage. Small chronic micro hemorrhage noted within the left lentiform nucleus. No other chronic hemorrhage. No mass lesion, midline shift, or mass effect. No hydrocephalus. No extra-axial fluid collection. Craniocervical junction is normal. Pituitary gland within normal limits. No acute abnormality about the orbits. Mild exophthalmos. Sequela prior bilateral lens extraction noted. Paranasal sinuses are clear. Trace opacity within the inferior right mastoid air cells. Mastoid air cells are otherwise clear. Inner ear structures grossly normal. Bone marrow signal intensity normal. No scalp soft tissue abnormality. MRA HEAD FINDINGS ANTERIOR CIRCULATION: Visualized distal cervical segments of the internal carotid arteries are widely patent with antegrade flow. Petrous, cavernous, and supraclinoid segments widely patent. Right A1 segment is hypoplastic, likely accounting for the slight caliber difference within the ICAs (right smaller than left). Left A1 segment widely patent. Anterior communicating artery normal. Anterior cerebral arteries well opacified. M1 segments widely patent without stenosis or occlusion. MCA bifurcations normal. No proximal M2 branch stenosis or occlusion. Distal MCA branches well opacified and symmetric. POSTERIOR CIRCULATION: Vertebral arteries patent to the vertebrobasilar junction. Right vertebral artery is dominant. Posterior inferior cerebral arteries patent bilaterally. Focal irregular mild to moderate stenosis within the mid basilar artery (series 5, image 87). The basilar artery is well opacified distally. Superior cerebral arteries patent bilaterally but demonstrate multi focal atheromatous irregularity. Left P1 segment is diminutive and  hypoplastic with a prominent left posterior communicating artery. Left PCA demonstrates multifocal atheromatous irregularity but is well opacified to its distal aspect. Right P1 segment widely patent. Focal short-segment moderate stenosis within the proximal right P2 segment (series 506, image 11). Right PCA is well opacified distally. Distal branch atheromatous irregularity within the right PCA itself. No aneurysm or vascular malformation. IMPRESSION: MRI HEAD IMPRESSION: 1. No acute intracranial infarct or other process identified. 2. Mild age-related cerebral atrophy with an chronic small vessel ischemic disease. MRA HEAD IMPRESSION: 1. No large or proximal arterial branch occlusion within the intracranial circulation. 2. 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 No other correctable or high-grade stenosis within the intracranial  circulation. 3. Small vessel atheromatous irregularity within the SCAs ans PCAs bilaterally. Electronically Signed   By: Jeannine Boga M.D.   On: 09/14/2015 21:38    ECG: No tracings this admission. Will obtain.   ECHOCARDIOGRAM: 09/15/2015 Study Conclusions - Left ventricle: The cavity size was mildly dilated. Wall thickness was increased in a pattern of mild LVH. There was mild concentric hypertrophy. Systolic function was normal. The estimated ejection fraction was 55%. Wall motion was normal; there were no regional wall motion abnormalities. - Atrial septum: No defect or patent foramen ovale was identified.  ASSESSMENT AND PLAN  1. 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 - admitted following a 20-minute episode of blurred vision, slurred speech and new-onset facial droop consistent with TIA.  - echo shows EF of 55% with no wall motion abnormalities. Carotid dopplers show 1-39% stenosis in the RICA and LICA which are patent with antegrade flow, consistent with previous studies two  years ago.  - Telemetry shows no atopic events or arrhythmias thus far. Will obtain 12-lead EKG. - Could consider 30-day event monitor as outpatient.   2. HTN - BP has been 110/65 - 182/94 in the past 25 hours - appears his BB, Clonidine, and ARB have been restarted. - continue to monitor.  3. HLD - LDL elevated to 137 - continue Simvastatin.  Signed, Erma Heritage, PA-C 09/15/2015, 3:38 PM Pager: [phone] Agree with above assessment by Bernerd Pho,,  PA-C.  The patient has a prior history of a stroke in 2014.  He does not have any history to suggest angina pectoris.  He denies any chest pain or shortness of breath.  He was eating at the [employer] when he developed difficulty with motor coordination of the right hand and had difficulty cutting up his food.  He was also noted to have facial droop and he also had partial visual disturbance like a curtain coming across his field of vision.  He did not lose consciousness.  He did become diaphoretic.  He does have a past history of diabetes and hypertension.  He's had a past history of hypercholesterolemia but has not been taking his statin medication regularly and his LDL is 137. His echocardiogram shows no wall motion abnormalities and his ejection fraction is normal at 55%.  His carotid Doppler ultrasound shows no significant stenosis bilaterally and no essential change from previous study 2 years ago. Here in the hospital his  EKG is pending.  Not yet done.  Review of prior EKG from March 2016 showed normal EKG.  Review of telemetry has shown no arrhythmias here in the hospital.  He has had no further episodes. Recommendations are as above.  Following discharge we will have him wear a 30 day event monitor to be sure that he is not having unrecognized paroxysmal atrial fibrillation.  He will continue his antiplatelets drug clopidogrel.  Emphasized to be sure to take his statin every day.

## 2015-09-15 NOTE — Progress Notes (Signed)
  Echocardiogram 2D Echocardiogram has been performed.  Cody Silva 09/15/2015, 1:17 PM

## 2015-09-16 ENCOUNTER — Other Ambulatory Visit: Payer: Self-pay | Admitting: Student

## 2015-09-16 DIAGNOSIS — E1122 Type 2 diabetes mellitus with diabetic chronic kidney disease: Secondary | ICD-10-CM | POA: Diagnosis not present

## 2015-09-16 DIAGNOSIS — G459 Transient cerebral ischemic attack, unspecified: Secondary | ICD-10-CM

## 2015-09-16 DIAGNOSIS — E11319 Type 2 diabetes mellitus with unspecified diabetic retinopathy without macular edema: Secondary | ICD-10-CM | POA: Diagnosis not present

## 2015-09-16 DIAGNOSIS — N183 Chronic kidney disease, stage 3 (moderate): Secondary | ICD-10-CM | POA: Diagnosis not present

## 2015-09-16 DIAGNOSIS — R55 Syncope and collapse: Secondary | ICD-10-CM

## 2015-09-16 DIAGNOSIS — I4891 Unspecified atrial fibrillation: Secondary | ICD-10-CM

## 2015-09-16 DIAGNOSIS — H538 Other visual disturbances: Secondary | ICD-10-CM | POA: Diagnosis not present

## 2015-09-16 DIAGNOSIS — I10 Essential (primary) hypertension: Secondary | ICD-10-CM | POA: Diagnosis not present

## 2015-09-16 LAB — GLUCOSE, CAPILLARY
GLUCOSE-CAPILLARY: 103 mg/dL — AB (ref 65–99)
GLUCOSE-CAPILLARY: 175 mg/dL — AB (ref 65–99)

## 2015-09-16 LAB — TROPONIN I: Troponin I: 0.03 ng/mL (ref <0.031)

## 2015-09-16 LAB — POTASSIUM: Potassium: 3.5 mmol/L (ref 3.5–5.1)

## 2015-09-16 LAB — MAGNESIUM: Magnesium: 1.7 mg/dL (ref 1.7–2.4)

## 2015-09-16 LAB — HEMOGLOBIN A1C
Hgb A1c MFr Bld: 7.9 % — ABNORMAL HIGH (ref 4.8–5.6)
MEAN PLASMA GLUCOSE: 180 mg/dL

## 2015-09-16 MED ORDER — NIACIN 100 MG PO TABS: 100.0000 mg | ORAL_TABLET | Freq: Every day | ORAL | Status: DC

## 2015-09-16 NOTE — Progress Notes (Signed)
Physical Therapy Treatment Patient Details Name: Cody Silva MRN: [id number] DOB: March 07, 1946 Today's Date: 09/16/2015    History of Present Illness Pt is a 70 y/o male who presents s/p episode of blurred vision, slurred speech and L facial droop. Symptoms had resolved by the time pt presented to the ED. MRI was negative for acute changes.    PT Comments    Pt anticipates d/c home today. Reports feeling better and essentially back to his "new baseline" which he describes as since his prior stroke. Pt continues to appear guarded with ambulation however no unsteadiness or LOB was noted.   Pt asking about going back to the [facility] and beginning an exercise routine. Pt was educated on safety awareness and recommended that pt let [facility] therapist put together an appropriate exercise routine for him to begin when safe. Pt does not appear willing to have home health come out, and we further discussed the Arthritis Class (water aerobics), Silver Sneakers, and personal training - all available at the [facility]. Did not recommend that pt exercise without supervision at this time. Will continue to follow.    Follow Up Recommendations  Home health PT;Supervision for mobility/OOB     Equipment Recommendations  None recommended by PT    Recommendations for Other Services       Precautions / Restrictions Precautions Precautions: Fall Restrictions Weight Bearing Restrictions: No    Mobility  Bed Mobility Overal bed mobility: Modified Independent             General bed mobility comments: HOB slightly elevated. Pt was able to transition to EOB without use of railings.   Transfers Overall transfer level: Modified independent Equipment used: None Transfers: Sit to/from Stand           General transfer comment: No assist required. Pt was able to power-up to full standing position without LOB or any noted unsteadiness.   Ambulation/Gait Ambulation/Gait assistance: Supervision Ambulation  Distance (Feet): 250 Feet Assistive device: None Gait Pattern/deviations: Step-through pattern;Decreased stride length;Wide base of support Gait velocity: Decreased Gait velocity interpretation: Below normal speed for age/gender General Gait Details: Decreased knee and hip flexion grossly. Pt states this is his "new baseline" since prior stroke a couple of years ago. Pt appears guarded at times but no LOB.    Stairs            Wheelchair Mobility    Modified Rankin (Stroke Patients Only)       Balance Overall balance assessment: Needs assistance Sitting-balance support: Feet supported;No upper extremity supported Sitting balance-Leahy Scale: Fair     Standing balance support: No upper extremity supported;During functional activity Standing balance-Leahy Scale: Fair                      Cognition Arousal/Alertness: Awake/alert Behavior During Therapy: WFL for tasks assessed/performed Overall Cognitive Status: Within Functional Limits for tasks assessed                      Exercises      General Comments        Pertinent Vitals/Pain Pain Assessment: No/denies pain    Home Living                      Prior Function            PT Goals (current goals can now be found in the care plan section) Acute Rehab PT Goals Patient Stated Goal: Not fall  on the stairs anymore PT Goal Formulation: With patient/family Time For Goal Achievement: 09/22/15 Potential to Achieve Goals: Good Progress towards PT goals: Progressing toward goals    Frequency  Min 3X/week    PT Plan Current plan remains appropriate    Co-evaluation             End of Session Equipment Utilized During Treatment: Gait belt Activity Tolerance: Patient tolerated treatment well Patient left: in chair;with call bell/phone within reach     Time: [id number] PT Time Calculation (min) (ACUTE ONLY): 15 min  Charges:  $Gait Training: 8-22 mins                     G Codes:      Rolinda Roan 2015-09-23, 10:44 AM   Rolinda Roan, PT, DPT Acute Rehabilitation Services Pager: [phone]

## 2015-09-16 NOTE — Progress Notes (Signed)
Pt discharged home with spouse. IV discontinued and discharge information given. Pt states Niacin prescription will be less expensive over the counter. Advised to buy over the counter as long as prescribed dosage and MD instructions followed. Pt agreeable. Will leave unit via wheelchair when spouse available to pick-up. Wendee Copp

## 2015-09-16 NOTE — Care Management Obs Status (Signed)
[facility] NOTIFICATION   Patient Details  Name: Cody Silva MRN: [id number] Date of Birth: 07-22-1946   Medicare Observation Status Notification Given:  Yes    Rolm Baptise, RN 09/16/2015, 11:41 AM

## 2015-09-16 NOTE — Progress Notes (Signed)
Occupational Therapy Evaluation Patient Details Name: Cody Silva MRN: [id number] DOB: Aug 08, 1946 Today's Date: 09/16/2015    History of Present Illness Pt is a 70 y/o male who presents s/p episode of blurred vision, slurred speech and L facial droop. Symptoms had resolved by the time pt presented to the ED. MRI was negative for acute changes.   Clinical Impression   Pt lives at home and has assistance available after D/C. PTA, pt mod I with mobility and ADL. Pt demonstrates general weakness with RUE and would benefit from OT at the neuro outpt center. Given HEO for RUE strengthening. Pt ready to D/C home when medically stable.     Follow Up Recommendations  Outpatient OT;Other (comment) (neuro outpt)    Equipment Recommendations  None recommended by OT    Recommendations for Other Services       Precautions / Restrictions Precautions Precautions: Fall Restrictions Weight Bearing Restrictions: No      Mobility Bed Mobility Overal bed mobility: Modified Independent             General bed mobility comments: HOB slightly elevated. Pt was able to transition to EOB without use of railings.   Transfers Overall transfer level: Modified independent Equipment used: None Transfers: Sit to/from Stand           General transfer comment:    Balance Overall balance assessment: Needs assistance Sitting-balance support: Feet supported;No upper extremity supported Sitting balance-Leahy Scale: Fair     Standing balance support: No upper extremity supported;During functional activity Standing balance-Leahy Scale: Fair                              ADL Overall ADL's : At baseline                                       General ADL Comments: Pt staes he frequently drops utnesils. Given theratubing to help with grip     Vision Vision Assessment?: No apparent visual deficits   Perception     Praxis Praxis Praxis tested?: Within  functional limits    Pertinent Vitals/Pain Pain Assessment: 0-10 Faces Pain Scale: Hurts a little bit Pain Location: R shoulder Pain Descriptors / Indicators: Aching Pain Intervention(s): Limited activity within patient's tolerance     Hand Dominance Right   Extremity/Trunk Assessment Upper Extremity Assessment Upper Extremity Assessment: RUE deficits/detail RUE Deficits / Details: generalized weakness and decreaed fine motor/coordination   Lower Extremity Assessment Lower Extremity Assessment: Defer to PT evaluation   Cervical / Trunk Assessment Cervical / Trunk Assessment: Other exceptions Cervical / Trunk Exceptions: Forward head posture with rounded shoulders   Communication Communication Communication: No difficulties   Cognition Arousal/Alertness: Awake/alert Behavior During Therapy: WFL for tasks assessed/performed Overall Cognitive Status: Within Functional Limits for tasks assessed                     General Comments       Exercises Exercises: Other exercises Other Exercises Other Exercises: theraputty hand strengthening - HEP Other Exercises: fine motor/coordiantion   Shoulder Instructions      Home Living Family/patient expects to be discharged to:: Private residence Living Arrangements: Spouse/significant other Available Help at Discharge: Family;Available 24 hours/day Type of Home: House Home Access: Level entry     Home Layout: Two level;Bed/bath upstairs;1/2 bath on main level Alternate  Level Stairs-Number of Steps: Flight Alternate Level Stairs-Rails: Left Bathroom Shower/Tub: Walk-in shower;Tub only   Bathroom Toilet: Standard Bathroom Accessibility: Yes   Home Equipment: Walker - 2 wheels;Cane - single point          Prior Functioning/Environment Level of Independence: Independent        Comments: Reports falls on the stairs at home    OT Diagnosis: Generalized weakness   OT Problem List: Decreased strength;Decreased  coordination;Impaired UE functional use;Pain   OT Treatment/Interventions:      OT Goals(Current goals can be found in the care plan section) Acute Rehab OT Goals Patient Stated Goal: to get stronger OT Goal Formulation: All assessment and education complete, DC therapy  OT Frequency:     Barriers to D/C:            Co-evaluation              End of Session Nurse Communication: Mobility status;Other (comment) (D/C needs)  Activity Tolerance: Patient tolerated treatment well Patient left: in chair;with call bell/phone within reach   Time: 1140-1205 OT Time Calculation (min): 25 min Charges:  OT General Charges $OT Visit: 1 Procedure OT Evaluation $OT Eval Low Complexity: 1 Procedure OT Treatments $Therapeutic Activity: 8-22 mins G-Codes: OT G-codes **NOT FOR INPATIENT CLASS** Functional Assessment Tool Used: clinical judgement Functional Limitation: Self care Self Care Current Status [id number]): At least 1 percent but less than 20 percent impaired, limited or restricted Self Care Goal Status [id number]): At least 1 percent but less than 20 percent impaired, limited or restricted Self Care Discharge Status [phone]): At least 1 percent but less than 20 percent impaired, limited or restricted  Sherilyn Windhorst,HILLARY 09/16/2015, 12:13 PM   [facility], OTR/L  [phone] 09/16/2015

## 2015-09-16 NOTE — Discharge Summary (Signed)
Cody Silva, is a 70 y.o. male  DOB 08-15-1946  MRN [id number].  Admission date:  09/14/2015  Admitting Physician  Etta Quill, DO  Discharge Date:  09/16/2015   Primary MD  Pcp Not In System  Recommendations for primary care physician for things to follow:   Monitor glycemic control, A1c, lipid panel and other secondary risk factors for stroke/TIA.   Admission Diagnosis  TIA (transient ischemic attack) [G45.9] Transient cerebral ischemia, unspecified transient cerebral ischemia type [G45.9]   Discharge Diagnosis  TIA (transient ischemic attack) [G45.9] Transient cerebral ischemia, unspecified transient cerebral ischemia type [G45.9]     Principal Problem:   TIA (transient ischemic attack) Active Problems:   Diabetes mellitus ([place])   Hypertension   CKD stage 3 due to type 2 diabetes mellitus ([place])      Past Medical History  Diagnosis Date  . Hypertension   . Diabetic retinopathy ([place])   . Diabetic neuropathy ([place])   . Colon polyps   . History of blood transfusion 10/30/2014    hematochezia  . Anemia   . OSA on CPAP     "suppose to wear mask; I've got a call in for an equipment change" (10/30/2014)  . Type II diabetes mellitus ([place])   . Headache     "@ least 3 times/wk" (10/30/2014)  . Stroke [facility]) 2014    left extremity deficits; facial left  . Arthritis     "right arm, right ankle, right side" (10/30/2014)  . History of gout   . PTSD (post-traumatic stress disorder)     "service related"    Past Surgical History  Procedure Laterality Date  . Cataract extraction w/ intraocular lens  implant, bilateral Bilateral 2014-2015  . Tumor removal Right ~ 1976    "arm; had to take bone left hip to add to the repair"  . Bone graft hip iliac crest Left ~ 1976       HPI  from the history and  physical done on the day of admission:    Cody Silva is a 70 y.o. male h/o DM2, HTN, R sided symptom stroke in 2014 who presents to ED with c/o transient episode of blurred vision, slurred speech, L sided facial droop. Blurred vision is described as a curtian blocking certain parts of his vision. He was LKW at 1600 while eating dinner with family, had sudden onset of these symptoms which improved on arrival to ED. Takes plavix daily at home.     Hospital Course:     1. Presyncopal episode. Initially thought to be TIA, history of CVA. Seen by neurology, MRI nonacute, per neurology and this likely was a cardiac event. Full stroke workup was done, patient was on Plavix which will be continued, home regimen of statin and insulin will be continued upon discharge, his LDL was above goal but he was noncompliant with Zocor, A1c was 7.9 and he has been counseled on strict adherence to his insulin regimen, request to monitor glycemic control and LDL close  He was seen by cardiology cleared from cardiac standpoint for home discharge, his echogram was cable, EKG nonacute, troponin negative 3, he will be continued on Plavix and statin, he will get a 30 day monitor on discharge.    2. Previous history of CVA. On Plavix and statin for secondary prevention. Continue secondary modification of risk factors in the outpatient setting.   3. Chronic Diastolic CHF. Last EF 55%. Compensated. Continue beta blocker.   4. CK D stage III. Baseline creatinine around 1.8. Monitor. No acute issues.   5. Dyslipidemia. LDL 137, already on Zocor high dose, add niacin. Patient was noncompliant with his statin counseled.   6. Essential hypertension. Tinea home regimen which includes Coreg and monitor.   7. DM type II. A1c was 7.9, resume home regimen with close PCP follow-up for better glycemic control. Requested to do every before meals at bedtime Accu-Cheks, maintenance log book and show it to PCP next  visit.   Discharge Condition: Stable  Follow UP  Follow-up Information    Follow up with SETHI,PRAMOD, MD On 03/21/2016.   Specialties:  Neurology, Radiology   Why:  at 130p   Contact information:   [address] [place] [address] [phone]       Follow up with PCP. Schedule an appointment as soon as possible for a visit in 1 week.      Follow up with [facility].   Specialty:  Cardiology   Why:  The office will call with an appointment to pick up your 30-day Cardiac Event Monitor. If you do not hear from them in the next 5 days, please call the number above.   Contact information:   [address] [place] [phone]      Follow up with [facility]   Specialty:  Rehabilitation   Why:  Office will call for appointment date/time. Call [phone] with any questions/concerns.   Contact information:   [address] [place] [id number] [place] [id number] [phone]       Consults obtained - Neuro, cards  Diet and Activity recommendation: See Discharge Instructions below  Discharge Instructions       Discharge Instructions    Ambulatory referral to Physical Therapy    Complete by:  As directed      Discharge instructions    Complete by:  As directed   Follow with Primary MD in 7 days   Get CBC, CMP, 2 view Chest X ray checked  by Primary MD next visit.    Activity: As tolerated with Full fall precautions use walker/cane & assistance as needed   Disposition Home    Diet:   Heart Healthy Low Carb.  Accuchecks 4 times/day, Once in AM empty stomach and then before each meal. Log in all results and show them to your Prim.MD in 3 days. If any glucose reading is under 80 or above 300 call your Prim MD immidiately. Follow Low glucose instructions for glucose under 80 as instructed.   For Heart failure patients - Check  your Weight same time everyday, if you gain over 2 pounds, or you develop in leg swelling, experience more shortness of breath or chest pain, call your Primary MD immediately. Follow Cardiac Low Salt Diet and 1.5 lit/day fluid restriction.   On your next visit with your primary care physician please Get Medicines reviewed and adjusted.   Please request your Prim.MD to go over  all Hospital Tests and Procedure/Radiological results at the follow up, please get all Hospital records sent to your Prim MD by signing hospital release before you go home.   If you experience worsening of your admission symptoms, develop shortness of breath, life threatening emergency, suicidal or homicidal thoughts you must seek medical attention immediately by calling 911 or calling your MD immediately  if symptoms less severe.  You Must read complete instructions/literature along with all the possible adverse reactions/side effects for all the Medicines you take and that have been prescribed to you. Take any new Medicines after you have completely understood and accpet all the possible adverse reactions/side effects.   Do not drive, operating heavy machinery, perform activities at heights, swimming or participation in water activities or provide baby sitting services if your were admitted for syncope or siezures until you have seen by Primary MD or a Neurologist and advised to do so again.  Do not drive when taking Pain medications.    Do not take more than prescribed Pain, Sleep and Anxiety Medications  Special Instructions: If you have smoked or chewed Tobacco  in the last 2 yrs please stop smoking, stop any regular Alcohol  and or any Recreational drug use.  Wear Seat belts while driving.   Please note  You were cared for by a hospitalist during your hospital stay. If you have any questions about your discharge medications or the care you received while you were in the hospital after you are discharged, you can  call the unit and asked to speak with the hospitalist on call if the hospitalist that took care of you is not available. Once you are discharged, your primary care physician will handle any further medical issues. Please note that NO REFILLS for any discharge medications will be authorized once you are discharged, as it is imperative that you return to your primary care physician (or establish a relationship with a primary care physician if you do not have one) for your aftercare needs so that they can reassess your need for medications and monitor your lab values.     Increase activity slowly    Complete by:  As directed              Discharge Medications       Medication List    TAKE these medications        acetaminophen 500 MG tablet  Commonly known as:  TYLENOL  Take 1 tablet (500 mg total) by mouth every 6 (six) hours as needed for mild pain, moderate pain, fever or headache.     albuterol 108 (90 Base) MCG/ACT inhaler  Commonly known as:  PROVENTIL HFA;VENTOLIN HFA  Inhale 2 puffs into the lungs every 6 (six) hours as needed for wheezing.     carvedilol 25 MG tablet  Commonly known as:  COREG  Take 25 mg by mouth 2 (two) times daily with a meal. Reported on 09/14/2015     citalopram 20 MG tablet  Commonly known as:  CELEXA  Take 20 mg by mouth daily.     cloNIDine 0.3 mg/24hr patch  Commonly known as:  CATAPRES - Dosed in mg/24 hr  Place 1 patch onto the skin once a week. No specific day     clopidogrel 75 MG tablet  Commonly known as:  PLAVIX  Take 1 tablet (75 mg total) by mouth at bedtime.     eucerin cream  Apply 1 application topically daily.     fluocinonide  cream 0.05 %  Commonly known as:  LIDEX  Apply 1 application topically 2 (two) times daily.     fluticasone 50 MCG/ACT nasal spray  Commonly known as:  FLONASE  Place 2 sprays into the nose daily as needed for allergies.     gabapentin 300 MG capsule  Commonly known as:  NEURONTIN  Take 300 mg by  mouth daily as needed (leg pain).     insulin aspart 100 UNIT/ML injection  Commonly known as:  novoLOG  Inject 5-15 Units into the skin 3 (three) times daily with meals. Per sliding scale     insulin glargine 100 UNIT/ML injection  Commonly known as:  LANTUS  Inject 30-49 Units into the skin at bedtime.     latanoprost 0.005 % ophthalmic solution  Commonly known as:  XALATAN  Place 1 drop into both eyes at bedtime.     losartan 100 MG tablet  Commonly known as:  COZAAR  Take 100 mg by mouth daily.     MULTIVITAMIN PO  Take 1 tablet by mouth daily.     niacin 100 MG tablet  Take 1 tablet (100 mg total) by mouth at bedtime.     polyvinyl alcohol 1.4 % ophthalmic solution  Commonly known as:  LIQUIFILM TEARS  Place 1 drop into both eyes 5 (five) times daily as needed (for dry eyes).     simvastatin 40 MG tablet  Commonly known as:  ZOCOR  Take 40 mg by mouth every evening.     spironolactone 25 MG tablet  Commonly known as:  ALDACTONE  Take 25 mg by mouth daily.        Major procedures and Radiology Reports - PLEASE review detailed and final reports for all details, in brief -    TTE  Left ventricle: The cavity size was mildly dilated. Wallthickness was increased in a pattern of mild LVH. There was mildconcentric hypertrophy. Systolic function was normal. Theestimated ejection fraction was 55%. Wall motion was normal;there were no regional wall motion abnormalities. - Atrial septum: No defect or patent foramen ovale was identified.  Carotids  The vertebral arteries appear patent with antegrade flow. Findings consistent with 1-39 percent stenosis involving the  right internal carotid artery and the left internal carotid  artery.    Ct Head Wo Contrast  09/14/2015  CLINICAL DATA:  Episode of slurred speech this evening lasting 1 hour, now resolved. EXAM: CT HEAD WITHOUT CONTRAST TECHNIQUE: Contiguous axial images were obtained from the base of the skull through  the vertex without intravenous contrast. COMPARISON:  03/17/2014 FINDINGS: Skull and Sinuses:Negative for fracture or destructive process. The visualized mastoids, middle ears, and imaged paranasal sinuses are clear. Visualized orbits: Bilateral cataract resection. Brain: No evidence of acute infarction, hemorrhage, hydrocephalus, or mass lesion/mass effect. Patchy cerebral white matter disease, which is mild for age, is underestimated relative to brain MRI from 2014. This is consistent with chronic small vessel ischemia in this patient with history of hypertension and diabetes. IMPRESSION: 1. No acute finding. 2. Chronic small vessel disease, mild. Electronically Signed   By: Monte Fantasia M.D.   On: 09/14/2015 17:58   Mr Brain Wo Contrast  09/14/2015  CLINICAL DATA:  Initial evaluation for transient slurred speech, blurry vision, left-sided facial droop. EXAM: MRI HEAD WITHOUT CONTRAST MRA HEAD WITHOUT CONTRAST TECHNIQUE: Multiplanar, multiecho pulse sequences of the brain and surrounding structures were obtained without intravenous contrast. Angiographic images of the head were obtained using MRA technique without contrast. COMPARISON:  Prior CT from earlier the same day. FINDINGS: MRI HEAD FINDINGS Diffuse prominence of the CSF containing spaces is compatible with generalized age-related cerebral atrophy. Mild patchy and confluent T2/FLAIR hyperintensity within the periventricular white matter most consistent with chronic small vessel ischemic disease, mild in nature. No abnormal foci of restricted diffusion to suggest acute intracranial infarct. Major intracranial vascular flow voids are maintained. Gray-white matter differentiation maintained. No acute intracranial hemorrhage. Small chronic micro hemorrhage noted within the left lentiform nucleus. No other chronic hemorrhage. No mass lesion, midline shift, or mass effect. No hydrocephalus. No extra-axial fluid collection. Craniocervical junction is  normal. Pituitary gland within normal limits. No acute abnormality about the orbits. Mild exophthalmos. Sequela prior bilateral lens extraction noted. Paranasal sinuses are clear. Trace opacity within the inferior right mastoid air cells. Mastoid air cells are otherwise clear. Inner ear structures grossly normal. Bone marrow signal intensity normal. No scalp soft tissue abnormality. MRA HEAD FINDINGS ANTERIOR CIRCULATION: Visualized distal cervical segments of the internal carotid arteries are widely patent with antegrade flow. Petrous, cavernous, and supraclinoid segments widely patent. Right A1 segment is hypoplastic, likely accounting for the slight caliber difference within the ICAs (right smaller than left). Left A1 segment widely patent. Anterior communicating artery normal. Anterior cerebral arteries well opacified. M1 segments widely patent without stenosis or occlusion. MCA bifurcations normal. No proximal M2 branch stenosis or occlusion. Distal MCA branches well opacified and symmetric. POSTERIOR CIRCULATION: Vertebral arteries patent to the vertebrobasilar junction. Right vertebral artery is dominant. Posterior inferior cerebral arteries patent bilaterally. Focal irregular mild to moderate stenosis within the mid basilar artery (series 5, image 87). The basilar artery is well opacified distally. Superior cerebral arteries patent bilaterally but demonstrate multi focal atheromatous irregularity. Left P1 segment is diminutive and hypoplastic with a prominent left posterior communicating artery. Left PCA demonstrates multifocal atheromatous irregularity but is well opacified to its distal aspect. Right P1 segment widely patent. Focal short-segment moderate stenosis within the proximal right P2 segment (series 506, image 11). Right PCA is well opacified distally. Distal branch atheromatous irregularity within the right PCA itself. No aneurysm or vascular malformation. IMPRESSION: MRI HEAD IMPRESSION: 1. No  acute intracranial infarct or other process identified. 2. Mild age-related cerebral atrophy with an chronic small vessel ischemic disease. MRA HEAD IMPRESSION: 1. No large or proximal arterial branch occlusion within the intracranial circulation. 2. 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 No other correctable or high-grade stenosis within the intracranial circulation. 3. Small vessel atheromatous irregularity within the SCAs ans PCAs bilaterally. Electronically Signed   By: Jeannine Boga M.D.   On: 09/14/2015 21:38   Mr Jodene Nam Head/brain Wo Cm  09/14/2015  CLINICAL DATA:  Initial evaluation for transient slurred speech, blurry vision, left-sided facial droop. EXAM: MRI HEAD WITHOUT CONTRAST MRA HEAD WITHOUT CONTRAST TECHNIQUE: Multiplanar, multiecho pulse sequences of the brain and surrounding structures were obtained without intravenous contrast. Angiographic images of the head were obtained using MRA technique without contrast. COMPARISON:  Prior CT from earlier the same day. FINDINGS: MRI HEAD FINDINGS Diffuse prominence of the CSF containing spaces is compatible with generalized age-related cerebral atrophy. Mild patchy and confluent T2/FLAIR hyperintensity within the periventricular white matter most consistent with chronic small vessel ischemic disease, mild in nature. No abnormal foci of restricted diffusion to suggest acute intracranial infarct. Major intracranial vascular flow voids are maintained. Gray-white matter differentiation maintained. No acute intracranial hemorrhage. Small chronic micro hemorrhage noted within the left lentiform nucleus. No other  chronic hemorrhage. No mass lesion, midline shift, or mass effect. No hydrocephalus. No extra-axial fluid collection. Craniocervical junction is normal. Pituitary gland within normal limits. No acute abnormality about the orbits. Mild exophthalmos. Sequela prior bilateral lens extraction noted. Paranasal sinuses are  clear. Trace opacity within the inferior right mastoid air cells. Mastoid air cells are otherwise clear. Inner ear structures grossly normal. Bone marrow signal intensity normal. No scalp soft tissue abnormality. MRA HEAD FINDINGS ANTERIOR CIRCULATION: Visualized distal cervical segments of the internal carotid arteries are widely patent with antegrade flow. Petrous, cavernous, and supraclinoid segments widely patent. Right A1 segment is hypoplastic, likely accounting for the slight caliber difference within the ICAs (right smaller than left). Left A1 segment widely patent. Anterior communicating artery normal. Anterior cerebral arteries well opacified. M1 segments widely patent without stenosis or occlusion. MCA bifurcations normal. No proximal M2 branch stenosis or occlusion. Distal MCA branches well opacified and symmetric. POSTERIOR CIRCULATION: Vertebral arteries patent to the vertebrobasilar junction. Right vertebral artery is dominant. Posterior inferior cerebral arteries patent bilaterally. Focal irregular mild to moderate stenosis within the mid basilar artery (series 5, image 87). The basilar artery is well opacified distally. Superior cerebral arteries patent bilaterally but demonstrate multi focal atheromatous irregularity. Left P1 segment is diminutive and hypoplastic with a prominent left posterior communicating artery. Left PCA demonstrates multifocal atheromatous irregularity but is well opacified to its distal aspect. Right P1 segment widely patent. Focal short-segment moderate stenosis within the proximal right P2 segment (series 506, image 11). Right PCA is well opacified distally. Distal branch atheromatous irregularity within the right PCA itself. No aneurysm or vascular malformation. IMPRESSION: MRI HEAD IMPRESSION: 1. No acute intracranial infarct or other process identified. 2. Mild age-related cerebral atrophy with an chronic small vessel ischemic disease. MRA HEAD IMPRESSION: 1. No large or  proximal arterial branch occlusion within the intracranial circulation. 2. 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 No other correctable or high-grade stenosis within the intracranial circulation. 3. Small vessel atheromatous irregularity within the SCAs ans PCAs bilaterally. Electronically Signed   By: Jeannine Boga M.D.   On: 09/14/2015 21:38    Micro Results      No results found for this or any previous visit (from the past 240 hour(s)).     Today   Subjective    Cody Silva today has no headache,no chest abdominal pain,no new weakness tingling or numbness, feels much better wants to go home today.     Objective   Blood pressure 160/85, pulse 62, temperature 98 F (36.7 C), temperature source Oral, resp. rate 20, height 6\' 4"  (1.93 m), weight 97.098 kg (214 lb 1 oz), SpO2 100 %.  No intake or output data in the 24 hours ending 09/16/15 1140  Exam Awake Alert, Oriented x 3, No new F.N deficits, Chronic mild right-sided hemiparesis, Normal affect [place].AT,PERRAL Supple Neck,No JVD, No cervical lymphadenopathy appriciated.  Symmetrical Chest wall movement, Good air movement bilaterally, CTAB RRR,No Gallops,Rubs or new Murmurs, No Parasternal Heave +ve B.Sounds, Abd Soft, Non tender, No organomegaly appriciated, No rebound -guarding or rigidity. No Cyanosis, Clubbing or edema, No new Rash or bruise   Data Review   CBC w Diff:  Lab Results  Component Value Date   WBC 4.8 09/14/2015   HGB 12.2* 09/14/2015   HCT 36.0* 09/14/2015   PLT 161 09/14/2015   LYMPHOPCT 20 09/14/2015   MONOPCT 6 09/14/2015   EOSPCT 1 09/14/2015   BASOPCT 1 09/14/2015  CMP:  Lab Results  Component Value Date   NA 140 09/14/2015   K 3.5 09/16/2015   CL 100* 09/14/2015   CO2 29 09/14/2015   BUN 16 09/14/2015   CREATININE 1.70* 09/14/2015   PROT 6.0* 09/14/2015   ALBUMIN 3.0* 09/14/2015   BILITOT 0.8 09/14/2015   ALKPHOS 57 09/14/2015   AST 17  09/14/2015   ALT 14* 09/14/2015  . Lab Results  Component Value Date   HGBA1C 7.9* 09/15/2015    Lab Results  Component Value Date   CHOL 215* 09/15/2015   HDL 58 09/15/2015   LDLCALC 137* 09/15/2015   TRIG 99 09/15/2015   CHOLHDL 3.7 09/15/2015     Total Time in preparing paper work, data evaluation and todays exam - 35 minutes  Thurnell Lose M.D on 09/16/2015 at 11:40 AM  [facility]   Office  [phone]

## 2015-09-16 NOTE — Discharge Instructions (Signed)
Follow with Primary MD in 7 days  ° °Get CBC, CMP, 2 view Chest X ray checked  by Primary MD next visit.  ° ° °Activity: As tolerated with Full fall precautions use walker/cane & assistance as needed ° ° °Disposition Home   ° ° °Diet:   Heart Healthy Low Carb. ° °Accuchecks 4 times/day, Once in AM empty stomach and then before each meal. °Log in all results and show them to your Prim.MD in 3 days. °If any glucose reading is under 80 or above 300 call your Prim MD immidiately. °Follow Low glucose instructions for glucose under 80 as instructed. ° ° °For Heart failure patients - Check your Weight same time everyday, if you gain over 2 pounds, or you develop in leg swelling, experience more shortness of breath or chest pain, call your Primary MD immediately. Follow Cardiac Low Salt Diet and 1.5 lit/day fluid restriction. ° ° °On your next visit with your primary care physician please Get Medicines reviewed and adjusted. ° ° °Please request your Prim.MD to go over all Hospital Tests and Procedure/Radiological results at the follow up, please get all Hospital records sent to your Prim MD by signing hospital release before you go home. ° ° °If you experience worsening of your admission symptoms, develop shortness of breath, life threatening emergency, suicidal or homicidal thoughts you must seek medical attention immediately by calling 911 or calling your MD immediately  if symptoms less severe. ° °You Must read complete instructions/literature along with all the possible adverse reactions/side effects for all the Medicines you take and that have been prescribed to you. Take any new Medicines after you have completely understood and accpet all the possible adverse reactions/side effects.  ° °Do not drive, operating heavy machinery, perform activities at heights, swimming or participation in water activities or provide baby sitting services if your were admitted for syncope or siezures until you have seen by Primary MD or  a Neurologist and advised to do so again. ° °Do not drive when taking Pain medications.  ° ° °Do not take more than prescribed Pain, Sleep and Anxiety Medications ° °Special Instructions: If you have smoked or chewed Tobacco  in the last 2 yrs please stop smoking, stop any regular Alcohol  and or any Recreational drug use. ° °Wear Seat belts while driving. ° ° °Please note ° °You were cared for by a hospitalist during your hospital stay. If you have any questions about your discharge medications or the care you received while you were in the hospital after you are discharged, you can call the unit and asked to speak with the hospitalist on call if the hospitalist that took care of you is not available. Once you are discharged, your primary care physician will handle any further medical issues. Please note that NO REFILLS for any discharge medications will be authorized once you are discharged, as it is imperative that you return to your primary care physician (or establish a relationship with a primary care physician if you do not have one) for your aftercare needs so that they can reassess your need for medications and monitor your lab values. ° °

## 2015-09-16 NOTE — Progress Notes (Signed)
Patient Name: Cody Silva Date of Encounter: 09/16/2015  Principal Problem:   TIA (transient ischemic attack) Active Problems:   Diabetes mellitus ([place])   Hypertension   CKD stage 3 due to type 2 diabetes mellitus [facility])    Primary Cardiologist: [place] Patient Profile: 70 y.o. male w/PMH of Type 2 DM, HTN, OSA, Stage 3 CKD and prior CVA in 2014 who presented to [facility] ED on 09/14/2015 for a 20-minute episode of blurred vision, slurred speech and new-onset facial droop consistent with TIA. Cards consulted on 09/15/2015 for possible syncope.  SUBJECTIVE: Reports feeling lightheaded at times. Denies any chest pain, palpitations, or shortness of breath.   OBJECTIVE Filed Vitals:   09/15/15 1824 09/15/15 2156 09/16/15 0155 09/16/15 0557  BP: 120/78 174/88 161/92 171/92  Pulse: 64 66 66 63  Temp: 98 F (36.7 C) 98.3 F (36.8 C) 98.3 F (36.8 C) 98.3 F (36.8 C)  TempSrc: Oral Oral Oral Oral  Resp: 18 18 18 18   Height:      Weight:      SpO2: 100% 100% 100% 100%   No intake or output data in the 24 hours ending 09/16/15 0948 Filed Weights   09/14/15 2155  Weight: 214 lb 1 oz (97.098 kg)    PHYSICAL EXAM General: Well developed, well nourished, male in no acute distress. Head: Normocephalic, atraumatic.  Neck: Supple without bruits, JVD not elevated. Lungs:  Resp regular and unlabored, CTA without wheezing or rales. Heart: RRR, S1, S2, no S3, S4, or murmur; no rub. Abdomen: Soft, non-tender, non-distended with normoactive bowel sounds. No hepatomegaly. No rebound/guarding. No obvious abdominal masses. Extremities: No clubbing, cyanosis, or edema. Distal pedal pulses are 2+ bilaterally. Neuro: Alert and oriented X 3. Moves all extremities spontaneously. Psych: Normal affect.    LABS: CBC: Recent Labs  09/14/15 1755 09/14/15 1812  WBC 4.8  --   NEUTROABS 3.5  --   HGB 11.8* 12.2*  HCT 34.4* 36.0*  MCV 87.3  --   PLT 161  --    INR: Recent  Labs  09/14/15 1755  INR [id number]   Basic Metabolic Panel: Recent Labs  09/14/15 1755 09/14/15 1812 09/16/15 0052  NA 140 140  --   K 3.2* 3.2* 3.5  CL 101 100*  --   CO2 29  --   --   GLUCOSE 235* 228*  --   BUN 14 16  --   CREATININE 1.95* 1.70*  --   CALCIUM 9.3  --   --   MG  --   --  1.7   Liver Function Tests: Recent Labs  09/14/15 1755  AST 17  ALT 14*  ALKPHOS 57  BILITOT 0.8  PROT 6.0*  ALBUMIN 3.0*   Cardiac Enzymes: Recent Labs  09/15/15 1324 09/15/15 1928 09/16/15 0052  TROPONINI 0.03 <0.03 0.03    Recent Labs  09/14/15 1810  TROPIPOC 0.00   BNP: No results found for: BNP PRO B NATRIURETIC PEPTIDE (BNP)  Date/Time Value Ref Range Status  03/17/2014 05:05 PM 773.0* 0 - 125 pg/mL Final   Hemoglobin A1C: Recent Labs  09/15/15 0523  HGBA1C 7.9*   Fasting Lipid Panel: Recent Labs  09/15/15 0523  CHOL 215*  HDL 58  LDLCALC 137*  TRIG 99  CHOLHDL 3.7    TELE:  NSR, rate in mid-50's - 70's. No atopic events.      ECG: NSR, HR 61. No acute ST or T-wave changes.  ECHO: 09/15/2015 Study Conclusions - Left ventricle: The cavity size was mildly dilated. Wall thickness was increased in a pattern of mild LVH. There was mild concentric hypertrophy. Systolic function was normal. The estimated ejection fraction was 55%. Wall motion was normal; there were no regional wall motion abnormalities. - Atrial septum: No defect or patent foramen ovale was identified.   Radiology/Studies: Ct Head Wo Contrast: 09/14/2015  CLINICAL DATA:  Episode of slurred speech this evening lasting 1 hour, now resolved. EXAM: CT HEAD WITHOUT CONTRAST TECHNIQUE: Contiguous axial images were obtained from the base of the skull through the vertex without intravenous contrast. COMPARISON:  03/17/2014 FINDINGS: Skull and Sinuses:Negative for fracture or destructive process. The visualized mastoids, middle ears, and imaged paranasal sinuses are clear. Visualized orbits:  Bilateral cataract resection. Brain: No evidence of acute infarction, hemorrhage, hydrocephalus, or mass lesion/mass effect. Patchy cerebral white matter disease, which is mild for age, is underestimated relative to brain MRI from 2014. This is consistent with chronic small vessel ischemia in this patient with history of hypertension and diabetes. IMPRESSION: 1. No acute finding. 2. Chronic small vessel disease, mild. Electronically Signed   By: Monte Fantasia M.D.   On: 09/14/2015 17:58   Mr Brain Wo Contrast: 09/14/2015  CLINICAL DATA:  Initial evaluation for transient slurred speech, blurry vision, left-sided facial droop. EXAM: MRI HEAD WITHOUT CONTRAST MRA HEAD WITHOUT CONTRAST TECHNIQUE: Multiplanar, multiecho pulse sequences of the brain and surrounding structures were obtained without intravenous contrast. Angiographic images of the head were obtained using MRA technique without contrast. COMPARISON:  Prior CT from earlier the same day. FINDINGS: MRI HEAD FINDINGS Diffuse prominence of the CSF containing spaces is compatible with generalized age-related cerebral atrophy. Mild patchy and confluent T2/FLAIR hyperintensity within the periventricular white matter most consistent with chronic small vessel ischemic disease, mild in nature. No abnormal foci of restricted diffusion to suggest acute intracranial infarct. Major intracranial vascular flow voids are maintained. Gray-white matter differentiation maintained. No acute intracranial hemorrhage. Small chronic micro hemorrhage noted within the left lentiform nucleus. No other chronic hemorrhage. No mass lesion, midline shift, or mass effect. No hydrocephalus. No extra-axial fluid collection. Craniocervical junction is normal. Pituitary gland within normal limits. No acute abnormality about the orbits. Mild exophthalmos. Sequela prior bilateral lens extraction noted. Paranasal sinuses are clear. Trace opacity within the inferior right mastoid air cells.  Mastoid air cells are otherwise clear. Inner ear structures grossly normal. Bone marrow signal intensity normal. No scalp soft tissue abnormality. MRA HEAD FINDINGS ANTERIOR CIRCULATION: Visualized distal cervical segments of the internal carotid arteries are widely patent with antegrade flow. Petrous, cavernous, and supraclinoid segments widely patent. Right A1 segment is hypoplastic, likely accounting for the slight caliber difference within the ICAs (right smaller than left). Left A1 segment widely patent. Anterior communicating artery normal. Anterior cerebral arteries well opacified. M1 segments widely patent without stenosis or occlusion. MCA bifurcations normal. No proximal M2 branch stenosis or occlusion. Distal MCA branches well opacified and symmetric. POSTERIOR CIRCULATION: Vertebral arteries patent to the vertebrobasilar junction. Right vertebral artery is dominant. Posterior inferior cerebral arteries patent bilaterally. Focal irregular mild to moderate stenosis within the mid basilar artery (series 5, image 87). The basilar artery is well opacified distally. Superior cerebral arteries patent bilaterally but demonstrate multi focal atheromatous irregularity. Left P1 segment is diminutive and hypoplastic with a prominent left posterior communicating artery. Left PCA demonstrates multifocal atheromatous irregularity but is well opacified to its distal aspect. Right P1 segment widely patent. Focal short-segment  moderate stenosis within the proximal right P2 segment (series 506, image 11). Right PCA is well opacified distally. Distal branch atheromatous irregularity within the right PCA itself. No aneurysm or vascular malformation. IMPRESSION: MRI HEAD IMPRESSION: 1. No acute intracranial infarct or other process identified. 2. Mild age-related cerebral atrophy with an chronic small vessel ischemic disease. MRA HEAD IMPRESSION: 1. No large or proximal arterial branch occlusion within the intracranial  circulation. 2. 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 No other correctable or high-grade stenosis within the intracranial circulation. 3. Small vessel atheromatous irregularity within the SCAs ans PCAs bilaterally. Electronically Signed   By: Jeannine Boga M.D.   On: 09/14/2015 21:38    Current Medications:  . carvedilol  25 mg Oral BID WC  . citalopram  20 mg Oral Daily  . [START ON 09/21/2015] cloNIDine  0.3 mg Transdermal Weekly  . clopidogrel  75 mg Oral QHS  . heparin  5,000 Units Subcutaneous 3 times per day  . insulin aspart  0-9 Units Subcutaneous TID WC  . insulin aspart  4 Units Subcutaneous TID WC  . insulin glargine  30 Units Subcutaneous QHS  . latanoprost  1 drop Both Eyes QHS  . losartan  100 mg Oral Daily  . niacin  100 mg Oral QHS  . simvastatin  40 mg Oral QPM  . spironolactone  25 mg Oral Daily      ASSESSMENT AND PLAN:  1. 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 - admitted following a 20-minute episode of blurred vision, slurred speech and new-onset facial droop consistent with TIA.  - echo shows EF of 55% with no wall motion abnormalities. Carotid dopplers show 1-39% stenosis in the RICA and LICA which are patent with antegrade flow, consistent with previous studies two years ago.  - Telemetry shows no atopic events or arrhythmias thus far. EKG shows no acute changes. - Placed an order for a 30-day Cardiac Event Monitor. The office will call the patient to schedule an appointment to pick up the monitor.    2. HTN - BP has been 110/65 - 174/92 in the past 25 hours - appears his BB, Clonidine, ARB, and Spironolactone have been restarted. - continue to monitor.  3. HLD - LDL elevated to 137 - continue Simvastatin.  Arna Medici , PA-C 9:48 AM 09/16/2015 Pager: [phone] Agree with above assessment. No further TIA spells. Patient has had no significant  arrhythmias on telemetry. No atrial fibrillation. We will assess further with outpatient 30 day event monitor through our office. No further inpatient cardiology workup planned.

## 2015-09-16 NOTE — Care Management Note (Signed)
Case Management Note  Patient Details  Name: NICHALOS BRENTON MRN: [id number] Date of Birth: [birth year]  Subjective/Objective:                    Action/Plan: Per Neurology note, outpatient PT is preferred over [place].  Discharge plan was discussed with Dr Candiss Norse, who is in agreement with Outpatient Therapy.  CM met with patient, who has chosen [facility], which he has been to in the past.  Electronic referral was placed in EPIC.  Written information was provided and added to the AVS.  Bedside RN updated. Expected Discharge Date:                  Expected Discharge Plan:  Home/Self Care  In-House Referral:     Discharge planning Services  CM Consult  Post Acute Care Choice:    Choice offered to:  Patient  DME Arranged:    DME Agency:     HH Arranged:    [place] Agency:     Status of Service:  Completed, signed off  Medicare Important Message Given:    Date Medicare IM Given:    Medicare IM give by:    Date Additional Medicare IM Given:    Additional Medicare Important Message give by:     If discussed at [place] of Stay Meetings, dates discussed:    Additional Comments:  Rolm Baptise, RN 09/16/2015, 11:37 AM

## 2015-09-16 NOTE — Progress Notes (Signed)
STROKE TEAM PROGRESS NOTE   SUBJECTIVE (INTERVAL HISTORY) Patient up in the chair at the bedside.    OBJECTIVE Temp:  [98 F (36.7 C)-98.3 F (36.8 C)] 98 F (36.7 C) (01/12 1016) Pulse Rate:  [61-66] 62 (01/12 1016) Cardiac Rhythm:  [-] Normal sinus rhythm (01/12 0700) Resp:  [16-20] 20 (01/12 1016) BP: (110-174)/(65-92) 160/85 mmHg (01/12 1016) SpO2:  [100 %] 100 % (01/12 1016)  CBC:   Recent Labs Lab 09/14/15 1755 09/14/15 1812  WBC 4.8  --   NEUTROABS 3.5  --   HGB 11.8* 12.2*  HCT 34.4* 36.0*  MCV 87.3  --   PLT 161  --     Basic Metabolic Panel:   Recent Labs Lab 09/14/15 1755 09/14/15 1812 09/16/15 0052  NA 140 140  --   K 3.2* 3.2* 3.5  CL 101 100*  --   CO2 29  --   --   GLUCOSE 235* 228*  --   BUN 14 16  --   CREATININE 1.95* 1.70*  --   CALCIUM 9.3  --   --   MG  --   --  1.7    Lipid Panel:     Component Value Date/Time   CHOL 215* 09/15/2015 0523   TRIG 99 09/15/2015 0523   HDL 58 09/15/2015 0523   CHOLHDL 3.7 09/15/2015 0523   VLDL 20 09/15/2015 0523   LDLCALC 137* 09/15/2015 0523   HgbA1c:  Lab Results  Component Value Date   HGBA1C 7.9* 09/15/2015   Urine Drug Screen:     Component Value Date/Time   LABOPIA NONE DETECTED 09/14/2015 1923   COCAINSCRNUR NONE DETECTED 09/14/2015 1923   LABBENZ NONE DETECTED 09/14/2015 1923   AMPHETMU NONE DETECTED 09/14/2015 1923   THCU NONE DETECTED 09/14/2015 1923   LABBARB NONE DETECTED 09/14/2015 1923      IMAGING  Ct Head Wo Contrast 09/14/2015   1. No acute finding. 2. Chronic small vessel disease, mild.   MRI HEAD  09/14/2015  1. No acute intracranial infarct or other process identified. 2. Mild age-related cerebral atrophy with an chronic small vessel ischemic disease.  MRA HEAD  09/14/2015  1. No large or proximal arterial branch occlusion within the intracranial circulation. 2. 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 No other correctable or  high-grade stenosis within the intracranial circulation. 3. Small vessel atheromatous irregularity within the SCAs ans PCAs bilaterally.   Carotid Doppler   There is 1-39% bilateral ICA stenosis. Vertebral artery flow is antegrade.    2D Echocardiogram  - Left ventricle: The cavity size was mildly dilated. Wall thickness was increased in a pattern of mild LVH. There was mildconcentric hypertrophy. Systolic function was normal. The estimated ejection fraction was 55%. Wall motion was normal; there were no regional wall motion abnormalities. - Atrial septum: No defect or patent foramen ovale was identified.   PHYSICAL EXAM Pleasant middle-aged African-American male currently not in distress. . Afebrile. Head is nontraumatic. Neck is supple without bruit.    Cardiac exam no murmur or gallop. Lungs are clear to auscultation. Distal pulses are well felt. Neurologic Exam  Mental Status: Awake and fully alert. Oriented to place and time. Attention span, concentration and fund of knowledge appropriate. Mood and affect appropriate. Mini-Mental status exam scored 28/30 with deficits in attention and calculation only. Animal naming test 12. Cranial Nerves: Pupils equal, briskly reactive to light. Extraocular movements full without nystagmus. Visual fields full to confrontation. Hearing  intact. Facial sensation intact. Face, tongue, palate moves normally and symmetrically.  Motor: Normal bulk and tone. Normal strength in all tested extremity muscles.diminished fine finger movements on left and orbits right over left upper extremity. Mild weakness of the right thumb and index muscles with some wasting of the first  interosseous space Sensory.: intact to touch and pinprick and vibratory.  Coordination: Rapid alternating movements normal in all extremities. Finger-to-nose and heel-to-shin performed accurately bilaterally.  Gait and Station: Arises from chair without difficulty. Stance is normal. Gait  demonstrates normal stride length, with some stiffness of the right leg. Poor arm swing on the left. Unable to heel, toe walk without difficulty. Tandem unsteady. Reflexes: 1+ and symmetric. Toes downgoing.    ASSESSMENT/PLAN Cody Silva is a 70 y.o. male with history of HTN and prior CVA presenting with transient episode of blurred vision, slurred speech and left-sided facial droop. He did not receive IV t-PA due to symptoms resolved, out of tPA window at time of evaluation.   Syncope. Doubt TIA  Resultant  Neuro deficits resolved  MRI  No acute stroke  MRA  No large vessel stenosis  Carotid Doppler  No significant stenosis   2D Echo  No source of embolus   LDL 137  HgbA1c 7.9  Heparin 5000 units sq tid for VTE prophylaxis Diet Carb Modified Fluid consistency:: Thin; Room service appropriate?: Yes  clopidogrel 75 mg daily prior to admission, now on clopidogrel 75 mg daily . Continue at discharge  Cardiology recommends 30 d monitor to look for atrial fibrillation, they will arrange. No other cardiology workup planned  Therapy recommendations:  HH PT  Disposition:  Return home with therapy. Stroke recommends OP therapy  Stroke team will sign off  Keep scheduled Follow up appt with Dr. Leonie Man already scheduled in July, 7/18 at 130p  Hypertension  Increasing since admission  Hyperlipidemia  Home meds:  zocor 40, resumed in hospital  LDL 137, goal < 70  Patient states he was not taking routinely  enoucouraged pt to take daily  Continue statin at discharge  Diabetes Diabetic Neuropathy Diabetic Retinopathy  HgbA1c 7.9 , goal < 7.0  Other Stroke Risk Factors  Advanced age  ETOH use  Hx stroke/TIA  2014 - bilateral corpus callosal infarcts due to stenosis of median artery of corpus callosumBilateral (large vessel disease) - resultant poor balance, unable to work as a Warden/ranger  Family hx stroke (brother)  HA 3x wk  Obstructive sleep  apnea, on CPAP at home  Other Active Problems  CKD stage 3  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 Leonie Man can assess followup  Hospital day #    [place] for Pager information 09/16/2015 10:46 AM  I have personally examined this patient, reviewed notes, independently viewed imaging studies, participated in medical decision making and plan of care. I have made any additions or clarifications directly to the above note. Agree with note above.   Antony Contras, MD Medical Director [facility] Pager: [phone] 09/16/2015 4:47 PM   To contact Stroke Continuity provider, please refer to [email or web address] After hours, contact General Neurology

## 2015-09-20 ENCOUNTER — Encounter: Payer: Self-pay | Admitting: *Deleted

## 2015-09-20 NOTE — Progress Notes (Signed)
Patient ID: Cody Silva, male   DOB: March 27, 1946, 70 y.o.   MRN: [id number] Patient did not show up for 09/20/2015, 2:30PM, appointment to have a 30 day cardiac event monitor applied.

## 2015-09-28 ENCOUNTER — Ambulatory Visit: Payer: Medicare Other | Attending: Internal Medicine | Admitting: Physical Therapy

## 2015-09-28 ENCOUNTER — Encounter: Payer: Self-pay | Admitting: Physical Therapy

## 2015-09-28 DIAGNOSIS — R531 Weakness: Secondary | ICD-10-CM | POA: Insufficient documentation

## 2015-09-28 DIAGNOSIS — R29818 Other symptoms and signs involving the nervous system: Secondary | ICD-10-CM | POA: Insufficient documentation

## 2015-09-28 DIAGNOSIS — R2681 Unsteadiness on feet: Secondary | ICD-10-CM | POA: Insufficient documentation

## 2015-09-28 DIAGNOSIS — R269 Unspecified abnormalities of gait and mobility: Secondary | ICD-10-CM | POA: Diagnosis not present

## 2015-09-28 DIAGNOSIS — M25572 Pain in left ankle and joints of left foot: Secondary | ICD-10-CM | POA: Diagnosis present

## 2015-09-28 DIAGNOSIS — R2689 Other abnormalities of gait and mobility: Secondary | ICD-10-CM

## 2015-09-28 DIAGNOSIS — W19XXXS Unspecified fall, sequela: Secondary | ICD-10-CM | POA: Insufficient documentation

## 2015-09-29 ENCOUNTER — Ambulatory Visit (INDEPENDENT_AMBULATORY_CARE_PROVIDER_SITE_OTHER): Payer: Medicare Other

## 2015-09-29 DIAGNOSIS — R55 Syncope and collapse: Secondary | ICD-10-CM

## 2015-09-29 DIAGNOSIS — G459 Transient cerebral ischemic attack, unspecified: Secondary | ICD-10-CM

## 2015-09-29 DIAGNOSIS — I4891 Unspecified atrial fibrillation: Secondary | ICD-10-CM

## 2015-09-29 NOTE — Therapy (Signed)
[facility] [address] [place], [place], [address] Phone: [phone]   Fax:  [phone]  Physical Therapy Evaluation  Patient Details  Name: Cody Silva MRN: [id number] Date of Birth: 10-08-1945 Referring Provider: [name], MD  Encounter Date: 09/28/2015      PT End of Session - 09/28/15 1100    Visit Number 1   Number of Visits 18   Date for PT Re-Evaluation 11/26/15   Authorization Type Medicare G-Code & progress noted every 10 visits   PT Start Time 1016   PT Stop Time 1058   PT Time Calculation (min) 42 min   Equipment Utilized During Treatment Gait belt   Behavior During Therapy [facility] for tasks assessed/performed      Past Medical History  Diagnosis Date  . Hypertension   . Diabetic retinopathy ([place])   . Diabetic neuropathy ([place])   . Colon polyps   . History of blood transfusion 10/30/2014    hematochezia  . Anemia   . OSA on CPAP     "suppose to wear mask; I've got a call in for an equipment change" (10/30/2014)  . Type II diabetes mellitus ([place])   . Headache     "@ least 3 times/wk" (10/30/2014)  . Stroke [facility]) 2014    left extremity deficits; facial left  . Arthritis     "right arm, right ankle, right side" (10/30/2014)  . History of gout   . PTSD (post-traumatic stress disorder)     "service related"    Past Surgical History  Procedure Laterality Date  . Cataract extraction w/ intraocular lens  implant, bilateral Bilateral 2014-2015  . Tumor removal Right ~ 1976    "arm; had to take bone left hip to add to the repair"  . Bone graft hip iliac crest Left ~ 1976    There were no vitals filed for this visit.  Visit Diagnosis:  Abnormality of gait  Unsteadiness  Balance problems  Weakness generalized  Pain in joint, ankle and foot, left  Falls, sequela      Subjective Assessment - 09/28/15 1030    Subjective This 70yo male had TIA on 09/14/2015 and was hospitalized  for 2 days. 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  He was referred to PT for evaluation. He is scheduled for cardiac monitor.    Patient Stated Goals He would like to improve balance & strength   Currently in Pain? Yes   Pain Score 5   in last week, worst 8/10, best 0/10   Pain Location Foot   Pain Orientation Right   Pain Descriptors / Indicators Pressure;Sore   Pain Type Chronic pain   Pain Onset More than a month ago   Pain Frequency Intermittent   Aggravating Factors  standing & walking   Pain Relieving Factors rest & medications   Effect of Pain on Daily Activities limits standing   Multiple Pain Sites No            [facility] PT Assessment - 09/28/15 1015    Assessment   Medical Diagnosis TIA   Referring Provider [name], MD   Onset Date/Surgical Date 09/14/15   Hand Dominance Right   Precautions   Precautions Fall   Restrictions   Weight Bearing Restrictions No   Balance Screen   Has the patient fallen in the past 6  months Yes   How many times? 3-4   legs get weak   Has the patient had a decrease in activity level because of a fear of falling?  Yes   Is the patient reluctant to leave their home because of a fear of falling?  Yes   [place] Private residence   Living Arrangements Spouse/significant other;Children  17yo dtr & 80yo son, 3yo niece   Type of [place] to enter   Entrance Stairs-Number of Steps 1   Entrance Stairs-Rails None   Home Layout Two level;1/2 bath on main level   Alternate Level Stairs-Number of Steps 14   Alternate Level Stairs-Rails Left   Prior Function   Level of Independence Independent;Independent with gait;Independent with basic ADLs;Independent with household mobility without device;Independent with community mobility without device   Vocation Retired   Observation/Other  Assessments   Skin Integrity Left foot under 5th MTP plantar surface has large callous with purple bruise appearance. PT advised to have PCP or podiatrist examine ASAP.    Focus on Therapeutic Outcomes (FOTO)  50.67 Functional Status   Neuro Quality of Life  38.8%   Fear Avoidance Belief Questionnaire (FABQ)  26 (8)   ROM / Strength   AROM / PROM / Strength AROM;Strength   AROM   Overall AROM  Deficits   Overall AROM Comments UEs shoulders ~100*, tightness in bil. hamstrings & hip flexors   AROM Assessment Site Ankle   Right/Left Ankle Right;Left   Right Ankle Dorsiflexion -7   Left Ankle Dorsiflexion -3   Strength   Overall Strength Deficits   Overall Strength Comments RUE grossly 3-4/5   Strength Assessment Site Hip;Knee;Ankle   Right/Left Hip Right;Left   Right Hip Flexion 4/5   Right Hip Extension 2+/5   Right Hip ABduction 2+/5   Left Hip Flexion 5/5   Left Hip Extension 3+/5   Left Hip ABduction 3+/5   Right/Left Knee Right;Left   Right Knee Flexion 3+/5   Right Knee Extension 4-/5   Left Knee Flexion 4/5   Left Knee Extension 5/5   Right/Left Ankle Right;Left   Right Ankle Dorsiflexion 3+/5   Right Ankle Plantar Flexion 3-/5   Right Ankle Inversion 3+/5   Right Ankle Eversion 3-/5   Left Ankle Dorsiflexion 5/5   Flexibility   Soft Tissue Assessment /Muscle Length --   Transfers   Transfers Sit to Stand;Stand to Sit   Sit to Stand 6: Modified independent (Device/Increase time);From chair/3-in-1;Without upper extremity assist   Stand to Sit 6: Modified independent (Device/Increase time);Without upper extremity assist;To chair/3-in-1   Ambulation/Gait   Ambulation/Gait Yes   Ambulation/Gait Assistance 5: Supervision   Ambulation Distance (Feet) 250 Feet   Assistive device None   Gait Pattern Decreased stance time - right;Decreased arm swing - right;Step-through pattern;Decreased step length - left;Decreased stride length;Decreased hip/knee flexion - right;Right foot  flat;Right flexed knee in stance;Antalgic;Trunk rotated posteriorly on right;Decreased trunk rotation;Poor foot clearance - right  right ankle/foot supination   Ambulation Surface Indoor;Level   Gait velocity 2.68 ft/sec   Stairs Yes   Stairs Assistance 5: Supervision  Pt reports most falls are ascending stairs   Stair Management Technique One rail Left;Alternating pattern;Forwards;Step to pattern  ascend alternating, descend step-to uncontrolled   Number of Stairs 4   Curb 5: Supervision  no device   Standardized Balance Assessment   Standardized Balance Assessment Berg Balance Test;Timed Up and Go Test  Berg Balance Test   Sit to Stand Able to stand without using hands and stabilize independently   Standing Unsupported Able to stand safely 2 minutes   Sitting with Back Unsupported but Feet Supported on Floor or Stool Able to sit safely and securely 2 minutes   Stand to Sit Sits safely with minimal use of hands   Transfers Able to transfer safely, minor use of hands   Standing Unsupported with Eyes Closed Able to stand 10 seconds with supervision   Standing Ubsupported with Feet Together Able to place feet together independently and stand for 1 minute with supervision   From Standing, Reach Forward with Outstretched Arm Can reach forward >5 cm safely (2")   From Standing Position, Pick up Object from [place] to pick up shoe, needs supervision   From Standing Position, Turn to Look Behind Over each Shoulder Looks behind one side only/other side shows less weight shift   Turn 360 Degrees Needs close supervision or verbal cueing   Standing Unsupported, Alternately Place Feet on Step/Stool Able to complete 4 steps without aid or supervision   Standing Unsupported, One Foot in Front Able to take small step independently and hold 30 seconds   Standing on One Leg Tries to lift leg/unable to hold 3 seconds but remains standing independently   Total Score 40   Timed Up and Go Test   Normal  TUG (seconds) 14.75  >13.5sec indicates fall risk   Cognitive TUG (seconds) 19.19  increase 30.1% & >16sec both indicate fall risk   Functional Gait  Assessment   Gait assessed  Yes   Gait Level Surface Walks 20 ft in less than 7 sec but greater than 5.5 sec, uses assistive device, slower speed, mild gait deviations, or deviates 6-10 in outside of the 12 in walkway width.   Change in Gait Speed Makes only minor adjustments to walking speed, or accomplishes a change in speed with significant gait deviations, deviates 10-15 in outside the 12 in walkway width, or changes speed but loses balance but is able to recover and continue walking.   Gait with Horizontal Head Turns Performs head turns smoothly with slight change in gait velocity (eg, minor disruption to smooth gait path), deviates 6-10 in outside 12 in walkway width, or uses an assistive device.   Gait with Vertical Head Turns Performs task with slight change in gait velocity (eg, minor disruption to smooth gait path), deviates 6 - 10 in outside 12 in walkway width or uses assistive device   Gait and Pivot Turn Turns slowly, requires verbal cueing, or requires several small steps to catch balance following turn and stop   Step Over Obstacle Is able to step over one shoe box (4.5 in total height) but must slow down and adjust steps to clear box safely. May require verbal cueing.   Gait with Narrow Base of Support Ambulates less than 4 steps heel to toe or cannot perform without assistance.   Gait with Eyes Closed Walks 20 ft, slow speed, abnormal gait pattern, evidence for imbalance, deviates 10-15 in outside 12 in walkway width. Requires more than 9 sec to ambulate 20 ft.   Ambulating Backwards Walks 20 ft, slow speed, abnormal gait pattern, evidence for imbalance, deviates 10-15 in outside 12 in walkway width.   Steps Two feet to a stair, must use rail.   Total Score 12  PT Short Term Goals -  09/28/15 1100    PT SHORT TERM GOAL #1   Title Patient demonstrates understanding of initial HEP (Target Date: 10/28/2015)   Time 1   Period Months   Status New   PT SHORT TERM GOAL #2   Title Timed Up-Go <13.5 sec to indicate lower fall risk.  (Target Date: 10/28/2015)   Time 1   Period Months   Status New   PT SHORT TERM GOAL #3   Title Berg Balance >45/56 to indicate lower fall risk.  (Target Date: 10/28/2015)   Time 1   Period Months   Status New   PT SHORT TERM GOAL #4   Title Patient ambulates 300' without device scanning environment & negotiating around obstacles with no balance losses.  (Target Date: 10/28/2015)   Time 1   Period Months   Status New           PT Long Term Goals - 09/28/15 1100    PT LONG TERM GOAL #1   Title Patient verbalizes ongoing fitness plan / HEP to progress strength, flexibility, endurance & balance.  (Target Date: 11/26/2015)   Time 2   Period Months   Status New   PT LONG TERM GOAL #2   Title Cognitive TUG increases <15% from standard TUG to indicate lower fall risk.  (Target Date: 11/26/2015)   Time 2   Period Months   Status New   PT LONG TERM GOAL #3   Title Berg Balance >48/56 to indicate lower fall risk.  (Target Date: 11/26/2015)   Time 2   Period Months   Status New   PT LONG TERM GOAL #4   Title Functional Gait Assessment >/= 20/30 to indicate lower fall with gait.  (Target Date: 11/26/2015)   Time 2   Period Months   Status New   PT LONG TERM GOAL #5   Title Patient ambulates 500' outside surfaces including grass, ramps, curbs & stairs (1 rail) modified independent for community mobilty.  (Target Date: 11/26/2015)   Time 2   Period Months   Status New               Plan - 09/28/15 1100    Clinical Impression Statement This 69yo with multiple co-morbidities had recent change in status with TIA. He needs cardiac monitoring which can make his condition unstable. He has weakness in right LE & UE impacting function &  safety. His gait is unsafe with deviations limiting safety especially with cognitive tasks, scanning, negotiating obstacles or multi-tasking. His standard TUG & cognitive TUG both indicate fall risk. His Berg Balance of 40/56 indicates fall risk. Functional Gait Assessment 12/30 indicates fall risk. He has fallen multiple times negotating barriers. He self reports Functional Status deficits  at 50.67 and Neuro QOL 38.8. Patient's plan of care is moderate.                Pt will benefit from skilled therapeutic intervention in order to improve on the following deficits Abnormal gait;Decreased activity tolerance;Decreased balance;Decreased coordination;Decreased knowledge of use of DME;Decreased mobility;Decreased range of motion;Decreased strength;Pain   Rehab Potential Good   PT Frequency 2x / week   PT Duration --  9 weeks (60 days)   PT Treatment/Interventions ADLs/Self Care Home Management;DME Instruction;Gait training;Functional mobility training;Therapeutic activities;Stair training;Therapeutic exercise;Balance training;Neuromuscular re-education;Patient/family education;Orthotic Fit/Training   PT Next Visit Plan Do 6-minute Walk Test including cardiac response, 5X sit to stand test, initiate HEP for strength & flexibility  Recommended Other Services PT recommended patient see PCP or podiatrist for foot evaluation including new foot orthoses & diabetic shoes. Patient verbalized understanding.    Consulted and Agree with Plan of Care Patient          G-Codes - 2015-09-29 1100    Functional Assessment Tool Used Berg Balance 40/56, Functional Gait Assessment 12/30   Functional Limitation Mobility: Walking and moving around   Mobility: Walking and Moving Around Current Status [phone]) At least 60 percent but less than 80 percent impaired, limited or restricted   Mobility: Walking and Moving Around Goal Status [phone]) At least 20 percent but less than 40 percent impaired, limited or restricted        Problem List Patient Active Problem List   Diagnosis Date Noted  . TIA (transient ischemic attack) 09/14/2015  . CKD stage 3 due to type 2 diabetes mellitus ([place]) 09/14/2015  . Acute lower GI bleeding 10/30/2014  . Orthostatic hypotension 10/30/2014  . History of colonic polyps 10/30/2014  . Depression 10/30/2014  . Symptomatic anemia 10/30/2014  . AKI (acute kidney injury) ([place]) 10/30/2014  . Ataxic gait 09/21/2014  . History of CVA (cerebrovascular accident) 02/22/2013  . Abnormal brain scan 02/21/2013  . Dizziness 02/21/2013  . Weakness 02/21/2013  . Diabetes mellitus ([place]) 02/21/2013  . Hypertension 02/21/2013    Jamey Reas PT, DPT 09/29/2015, 9:30 AM  [place] [address] [place], [place], [address] Phone: [phone]   Fax:  [phone]  Name: KAMSIYOCHUKWU MACVICAR MRN: [id number] Date of Birth: 1946-07-05

## 2015-10-01 ENCOUNTER — Ambulatory Visit: Payer: Medicare Other | Admitting: Physical Therapy

## 2015-10-01 DIAGNOSIS — R531 Weakness: Secondary | ICD-10-CM

## 2015-10-01 DIAGNOSIS — R269 Unspecified abnormalities of gait and mobility: Secondary | ICD-10-CM | POA: Diagnosis not present

## 2015-10-01 DIAGNOSIS — R2681 Unsteadiness on feet: Secondary | ICD-10-CM

## 2015-10-01 NOTE — Patient Instructions (Addendum)
Achilles / Gastroc, Standing    Stand, right foot behind, heel on floor and turned slightly out, leg straight, forward leg bent. Move hips forward. Hold _30__ seconds. Then bend back knee slightly (you should feel a stretch deep in your calf) and hold for additional 30 seconds.  Repeat _4__ times on each leg, twice per day.   Bridging    Slowly raise buttocks from floor, keeping stomach tight. Repeat __12__ times per set. Do __3__ sets per day.  Deep Squat    Stand in front of a stable chair with feet shoulder width apart and squat deeply (as though you're about to sit in the chair) head and chest up. *Make sure your knees aren't moving over your toes. Repeat __15__ times per set. Do _3__ sets per day.  Copyright  VHI. All rights reserved.

## 2015-10-01 NOTE — Therapy (Signed)
[facility] [address] [place], [place], [address] Phone: [phone]   Fax:  [phone]  Physical Therapy Treatment  Patient Details  Name: Cody Silva MRN: [id number] Date of Birth: [birth year] Referring Provider: [name], MD  Encounter Date: 10/01/2015      PT End of Session - 10/01/15 1253    Visit Number 2   Number of Visits 18   Date for PT Re-Evaluation 11/26/15   Authorization Type Medicare G-Code & progress noted every 10 visits   PT Start Time 1158  Pt arrived late to session   PT Stop Time 1243   PT Time Calculation (min) 45 min   Equipment Utilized During Treatment Gait belt   Activity Tolerance Patient tolerated treatment well   Behavior During Therapy [facility] for tasks assessed/performed      Past Medical History  Diagnosis Date  . Hypertension   . Diabetic retinopathy ([place])   . Diabetic neuropathy ([place])   . Colon polyps   . History of blood transfusion 10/30/2014    hematochezia  . Anemia   . OSA on CPAP     "suppose to wear mask; I've got a call in for an equipment change" (10/30/2014)  . Type II diabetes mellitus ([place])   . Headache     "@ least 3 times/wk" (10/30/2014)  . Stroke [facility]) 2014    left extremity deficits; facial left  . Arthritis     "right arm, right ankle, right side" (10/30/2014)  . History of gout   . PTSD (post-traumatic stress disorder)     "service related"    Past Surgical History  Procedure Laterality Date  . Cataract extraction w/ intraocular lens  implant, bilateral Bilateral 2014-2015  . Tumor removal Right ~ 1976    "arm; had to take bone left hip to add to the repair"  . Bone graft hip iliac crest Left ~ 1976    There were no vitals filed for this visit.  Visit Diagnosis:  Abnormality of gait  Unsteadiness  Weakness generalized      Subjective Assessment - 10/01/15 1201    Subjective Pt reports no significant changes, no falls. States,  "I did lose my balance going up the steps." Pt reports he received heart monitor within the past week. Pt will make appt with podiatrist today to address callus on L foot.   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 Yes   Pain Score 3    Pain Location Foot   Pain Orientation Right   Pain Descriptors / Indicators Sore;Pressure   Pain Type Chronic pain   Pain Onset More than a month ago   Pain Frequency Intermittent   Aggravating Factors  WB; standing/walking   Pain Relieving Factors rest, medication   Multiple Pain Sites Yes   Pain Score 7   Pain Location Neck   Pain Orientation Right   Pain Descriptors / Indicators Shooting   Pain Type Chronic pain   Pain Radiating Towards down right arm   Pain Onset More than a month ago  since August 2016   Pain Frequency Intermittent   Aggravating Factors  strain, movement   Pain Relieving Factors Tylenol  [facility] PT Assessment - 10/01/15 0001    Transfers   Five time sit to stand comments  19.10 seconds   6 Minute Walk- Baseline   6 Minute Walk- Baseline yes   BP (mmHg) 155/86 mmHg   HR (bpm) 67   02 Sat (%RA) 99 %   6 Minute walk- Post Test   6 Minute Walk Post Test yes   BP (mmHg) 160/88 mmHg   HR (bpm) 68   02 Sat (%RA) 99 %   Modified Borg Scale for Dyspnea 4- somewhat severe   Perceived Rate of Exertion (Borg) 13- Somewhat hard   6 minute walk test results    Aerobic Endurance Distance Walked 868                     [facility] Adult PT Treatment/Exercise - 10/01/15 0001    Ambulation/Gait   Ambulation/Gait Yes   Ambulation/Gait Assistance 5: Supervision   Ambulation Distance (Feet) 868 Feet  on 6MWT   Assistive device None   Gait Pattern Decreased stance time - right;Decreased arm swing -  right;Step-through pattern;Decreased step length - left;Decreased stride length;Decreased hip/knee flexion - right;Right foot flat;Right flexed knee in stance;Antalgic;Trunk rotated posteriorly on right;Decreased trunk rotation;Poor foot clearance - right;Decreased weight shift to right  right ankle/foot supination   Ambulation Surface Level;Indoor   Stairs Yes   Stairs Assistance 5: Supervision   Stairs Assistance Details (indicate cue type and reason) 4 stairs x 2 trials with L rail (to simulate home environment); pt required supervision due to no use of rail ("it feels awkward with my left hand", per pt). Provided verbal/demo cueing for lateral negotiation with BUE support at L rail; also instructed pt to perform with step-to pattern to increase stability.   Stair Management Technique One rail Left   Number of Stairs 8   Height of Stairs 6   Exercises   Exercises Other Exercises   Other Exercises  With cueing from PT, pt performed bridging x12 reps (to pt fatigue) and squats x15 reps; and B gastroc/soleus stretching 4 x30-sec holds per side. See Pt instructions for details on all exercise, reps, sets, frequency/duration.                 PT Education - 10/01/15 1242    Education provided Yes   Education Details Findings on 6MWT and 5X STS and functional implications. HEP initiatedl see PT Instructions.    Person(s) Educated Patient   Methods Explanation;Demonstration;Verbal cues;Handout   Comprehension Verbalized understanding;Returned demonstration          PT Short Term Goals - 09/28/15 1100    PT SHORT TERM GOAL #1   Title Patient demonstrates understanding of initial HEP (Target Date: 10/28/2015)   Time 1   Period Months   Status New   PT SHORT TERM GOAL #2   Title Timed Up-Go <13.5 sec to indicate lower fall risk.  (Target Date: 10/28/2015)   Time 1   Period Months   Status New   PT SHORT TERM GOAL #3   Title Berg Balance >45/56 to indicate lower fall risk.  (Target  Date: 10/28/2015)   Time 1   Period Months   Status New   PT SHORT TERM GOAL #4   Title Patient ambulates 300' without device scanning environment & negotiating around obstacles with no balance losses.  (Target Date: 10/28/2015)   Time 1   Period Months   Status New  PT Long Term Goals - 09/28/15 1100    PT LONG TERM GOAL #1   Title Patient verbalizes ongoing fitness plan / HEP to progress strength, flexibility, endurance & balance.  (Target Date: 11/26/2015)   Time 2   Period Months   Status New   PT LONG TERM GOAL #2   Title Cognitive TUG increases <15% from standard TUG to indicate lower fall risk.  (Target Date: 11/26/2015)   Time 2   Period Months   Status New   PT LONG TERM GOAL #3   Title Berg Balance >48/56 to indicate lower fall risk.  (Target Date: 11/26/2015)   Time 2   Period Months   Status New   PT LONG TERM GOAL #4   Title Functional Gait Assessment >/= 20/30 to indicate lower fall with gait.  (Target Date: 11/26/2015)   Time 2   Period Months   Status New   PT LONG TERM GOAL #5   Title Patient ambulates 500' outside surfaces including grass, ramps, curbs & stairs (1 rail) modified independent for community mobilty.  (Target Date: 11/26/2015)   Time 2   Period Months   Status New               Plan - 10/01/15 1254    Clinical Impression Statement Session focused on assessing/addressing functional LE strength, endurance. 6MWT distance 868', suggesting limited functional endurance. 5X STS 19.10 seconds, suggesting increased fall risk.    Pt will benefit from skilled therapeutic intervention in order to improve on the following deficits Abnormal gait;Decreased activity tolerance;Decreased balance;Decreased coordination;Decreased knowledge of use of DME;Decreased mobility;Decreased range of motion;Decreased strength;Pain   Rehab Potential Good   PT Frequency 2x / week   PT Duration Other (comment)  9 weeks (60 days)   PT Treatment/Interventions  ADLs/Self Care Home Management;DME Instruction;Gait training;Functional mobility training;Therapeutic activities;Stair training;Therapeutic exercise;Balance training;Neuromuscular re-education;Patient/family education;Orthotic Fit/Training   PT Next Visit Plan Check on HEP performance and expand on HEP.   Consulted and Agree with Plan of Care Patient        Problem List Patient Active Problem List   Diagnosis Date Noted  . TIA (transient ischemic attack) 09/14/2015  . CKD stage 3 due to type 2 diabetes mellitus ([place]) 09/14/2015  . Acute lower GI bleeding 10/30/2014  . Orthostatic hypotension 10/30/2014  . History of colonic polyps 10/30/2014  . Depression 10/30/2014  . Symptomatic anemia 10/30/2014  . AKI (acute kidney injury) ([place]) 10/30/2014  . Ataxic gait 09/21/2014  . History of CVA (cerebrovascular accident) 02/22/2013  . Abnormal brain scan 02/21/2013  . Dizziness 02/21/2013  . Weakness 02/21/2013  . Diabetes mellitus ([place]) 02/21/2013  . Hypertension 02/21/2013    Billie Ruddy, PT, DPT [facility] [address] [place], [place], [address] Phone: [phone]   Fax:  [phone] 10/01/2015, 1:00 PM   Name: Cody Silva MRN: [id number] Date of Birth: 1946/01/29

## 2015-10-05 ENCOUNTER — Encounter: Payer: Self-pay | Admitting: Rehabilitation

## 2015-10-05 ENCOUNTER — Ambulatory Visit: Payer: Medicare Other | Admitting: Rehabilitation

## 2015-10-05 DIAGNOSIS — R531 Weakness: Secondary | ICD-10-CM

## 2015-10-05 DIAGNOSIS — R269 Unspecified abnormalities of gait and mobility: Secondary | ICD-10-CM

## 2015-10-05 DIAGNOSIS — R2689 Other abnormalities of gait and mobility: Secondary | ICD-10-CM

## 2015-10-05 DIAGNOSIS — R2681 Unsteadiness on feet: Secondary | ICD-10-CM

## 2015-10-05 NOTE — Therapy (Signed)
[facility] [address] [place], [place], [address] Phone: [phone]   Fax:  [phone]  Physical Therapy Treatment  Patient Details  Name: Cody Silva MRN: [id number] Date of Birth: [birth year] Referring Provider: [name], MD  Encounter Date: 10/05/2015      PT End of Session - 10/05/15 1301    Visit Number 3   Number of Visits 18   Date for PT Re-Evaluation 11/26/15   Authorization Type Medicare G-Code & progress noted every 10 visits   PT Start Time 1205   PT Stop Time 1232   PT Time Calculation (min) 27 min   Equipment Utilized During Treatment Gait belt   Activity Tolerance Patient tolerated treatment well   Behavior During Therapy [facility] for tasks assessed/performed      Past Medical History  Diagnosis Date  . Hypertension   . Diabetic retinopathy ([place])   . Diabetic neuropathy ([place])   . Colon polyps   . History of blood transfusion 10/30/2014    hematochezia  . Anemia   . OSA on CPAP     "suppose to wear mask; I've got a call in for an equipment change" (10/30/2014)  . Type II diabetes mellitus ([place])   . Headache     "@ least 3 times/wk" (10/30/2014)  . Stroke [facility]) 2014    left extremity deficits; facial left  . Arthritis     "right arm, right ankle, right side" (10/30/2014)  . History of gout   . PTSD (post-traumatic stress disorder)     "service related"    Past Surgical History  Procedure Laterality Date  . Cataract extraction w/ intraocular lens  implant, bilateral Bilateral 2014-2015  . Tumor removal Right ~ 1976    "arm; had to take bone left hip to add to the repair"  . Bone graft hip iliac crest Left ~ 1976    There were no vitals filed for this visit.  Visit Diagnosis:  Abnormality of gait  Unsteadiness  Weakness generalized  Balance problems      Subjective Assessment - 10/05/15 1257    Subjective Pt reports dizziness/light headedness with standing during  session.    Patient Stated Goals He would like to improve balance & strength   Currently in Pain? No/denies            Self care:  Educated pt on possible causes of orthostatic hypotension.  Note that BP in seated position was 130/74 and upon standing for 2 mins was 92/58.  Encouraged pt to drink plenty of water/fluids and ensure taking BP medication as directed as he states that he doesn't always take at the same time each day.   TE:  Provided pt with initial HEP for BLE and RLE strength and balance.  See pt instruction for details.                       PT Education - 10/05/15 1300    Education provided Yes   Education Details education on possible causes of orthostatic hypotension   Person(s) Educated Patient   Methods Explanation   Comprehension Verbalized understanding          PT Short Term Goals - 09/28/15 1100    PT SHORT TERM GOAL #1   Title Patient demonstrates understanding of initial HEP (Target Date: 10/28/2015)   Time 1   Period Months   Status New   PT SHORT TERM GOAL #2   Title  Timed Up-Go <13.5 sec to indicate lower fall risk.  (Target Date: 10/28/2015)   Time 1   Period Months   Status New   PT SHORT TERM GOAL #3   Title Berg Balance >45/56 to indicate lower fall risk.  (Target Date: 10/28/2015)   Time 1   Period Months   Status New   PT SHORT TERM GOAL #4   Title Patient ambulates 300' without device scanning environment & negotiating around obstacles with no balance losses.  (Target Date: 10/28/2015)   Time 1   Period Months   Status New           PT Long Term Goals - 09/28/15 1100    PT LONG TERM GOAL #1   Title Patient verbalizes ongoing fitness plan / HEP to progress strength, flexibility, endurance & balance.  (Target Date: 11/26/2015)   Time 2   Period Months   Status New   PT LONG TERM GOAL #2   Title Cognitive TUG increases <15% from standard TUG to indicate lower fall risk.  (Target Date: 11/26/2015)   Time 2    Period Months   Status New   PT LONG TERM GOAL #3   Title Berg Balance >48/56 to indicate lower fall risk.  (Target Date: 11/26/2015)   Time 2   Period Months   Status New   PT LONG TERM GOAL #4   Title Functional Gait Assessment >/= 20/30 to indicate lower fall with gait.  (Target Date: 11/26/2015)   Time 2   Period Months   Status New   PT LONG TERM GOAL #5   Title Patient ambulates 500' outside surfaces including grass, ramps, curbs & stairs (1 rail) modified independent for community mobilty.  (Target Date: 11/26/2015)   Time 2   Period Months   Status New               Plan - 10/05/15 1303    Clinical Impression Statement Skilled session focused on initiation of HEP for BLE and RLE strength and balance.  See pt instruction for details.  Note pt orthostatic during session, therefore educated on possible causes and to notify MD upon next appt.  Pt verbalized understanding.    Pt will benefit from skilled therapeutic intervention in order to improve on the following deficits Abnormal gait;Decreased activity tolerance;Decreased balance;Decreased coordination;Decreased knowledge of use of DME;Decreased mobility;Decreased range of motion;Decreased strength;Pain   Rehab Potential Good   PT Frequency 2x / week   PT Duration Other (comment)  9 weeks (60 days)   PT Treatment/Interventions ADLs/Self Care Home Management;DME Instruction;Gait training;Functional mobility training;Therapeutic activities;Stair training;Therapeutic exercise;Balance training;Neuromuscular re-education;Patient/family education;Orthotic Fit/Training   PT Next Visit Plan Check on HEP performance and expand on HEP. Add balance to HEP, check on orthostatics   Consulted and Agree with Plan of Care Patient        Problem List Patient Active Problem List   Diagnosis Date Noted  . TIA (transient ischemic attack) 09/14/2015  . CKD stage 3 due to type 2 diabetes mellitus ([place]) 09/14/2015  . Acute lower GI  bleeding 10/30/2014  . Orthostatic hypotension 10/30/2014  . History of colonic polyps 10/30/2014  . Depression 10/30/2014  . Symptomatic anemia 10/30/2014  . AKI (acute kidney injury) ([place]) 10/30/2014  . Ataxic gait 09/21/2014  . History of CVA (cerebrovascular accident) 02/22/2013  . Abnormal brain scan 02/21/2013  . Dizziness 02/21/2013  . Weakness 02/21/2013  . Diabetes mellitus ([place]) 02/21/2013  . Hypertension 02/21/2013  Cameron Sprang, PT, MPT [facility] [address] [place], [place], [address] Phone: [phone]   Fax:  [phone] 10/05/2015, 1:06 PM  Name: Cody Silva MRN: [id number] Date of Birth: 1946-07-21

## 2015-10-05 NOTE — Patient Instructions (Signed)
Functional Quadriceps: Sit to Stand    Sit on edge of chair (chose a lower chair to increase difficulty), feet flat on floor. Stand upright, extending knees fully.  Do not use your hands!! Repeat __10__ times per set. Do _1___ sets per session. Do _1___ sessions per day.  http://orth.exer.us/734   Copyright  VHI. All rights reserved.   Bridging (Single Leg)    Lie on back with feet shoulder width apart and left leg straight. Use right leg to lift hips toward the ceiling while keeping leg straight. Hold _2___ seconds. Repeat __10__ times. Do __1__ sessions per day.  http://gt2.exer.us/358   Copyright  VHI. All rights reserved.   Abduction: Clam (Eccentric) - Side-Lying    Lie on side with knees bent. Lift top knee, keeping feet together. Keep trunk steady. Slowly lower for 3-5 seconds. _10__ reps per set, _1__ sets per day, _5__ days per week.  http://ecce.exer.us/64   Copyright  VHI. All rights reserved.   SINGLE LIMB STANCE    Stand at counter top and use counter for support.  Stance: single leg on floor. Raise leg. Hold _15__ seconds. Repeat with other leg.  Try to use only one hand and as it gets easier, lessen the amount that you are using the one hand to increase challenge.   _3__ reps per set, _1__ sets per day, _5__ days per week  Copyright  VHI. All rights reserved.

## 2015-10-07 ENCOUNTER — Ambulatory Visit: Payer: Medicare Other | Attending: Internal Medicine | Admitting: Physical Therapy

## 2015-10-07 DIAGNOSIS — R29818 Other symptoms and signs involving the nervous system: Secondary | ICD-10-CM | POA: Diagnosis present

## 2015-10-07 DIAGNOSIS — M25572 Pain in left ankle and joints of left foot: Secondary | ICD-10-CM | POA: Diagnosis present

## 2015-10-07 DIAGNOSIS — W19XXXS Unspecified fall, sequela: Secondary | ICD-10-CM | POA: Diagnosis not present

## 2015-10-07 DIAGNOSIS — R531 Weakness: Secondary | ICD-10-CM | POA: Diagnosis present

## 2015-10-07 DIAGNOSIS — R2681 Unsteadiness on feet: Secondary | ICD-10-CM

## 2015-10-07 DIAGNOSIS — R269 Unspecified abnormalities of gait and mobility: Secondary | ICD-10-CM

## 2015-10-07 NOTE — Therapy (Signed)
[place] [address] [place], [place], [address] Phone: [phone]   Fax:  [phone]  Physical Therapy Treatment  Patient Details  Name: Cody Silva MRN: [id number] Date of Birth: Jul 12, 1946 Referring Provider: [name], MD  Encounter Date: 10/07/2015      PT End of Session - 10/07/15 1200    Visit Number 4   Number of Visits 18   Date for PT Re-Evaluation 11/26/15   Authorization Type Medicare G-Code & progress noted every 10 visits   PT Start Time 1112  Pt arrived late to session   PT Stop Time 1150   PT Time Calculation (min) 38 min   Activity Tolerance Patient tolerated treatment well   Behavior During Therapy [facility] for tasks assessed/performed      Past Medical History  Diagnosis Date  . Hypertension   . Diabetic retinopathy ([place])   . Diabetic neuropathy ([place])   . Colon polyps   . History of blood transfusion 10/30/2014    hematochezia  . Anemia   . OSA on CPAP     "suppose to wear mask; I've got a call in for an equipment change" (10/30/2014)  . Type II diabetes mellitus ([place])   . Headache     "@ least 3 times/wk" (10/30/2014)  . Stroke [facility]) 2014    left extremity deficits; facial left  . Arthritis     "right arm, right ankle, right side" (10/30/2014)  . History of gout   . PTSD (post-traumatic stress disorder)     "service related"    Past Surgical History  Procedure Laterality Date  . Cataract extraction w/ intraocular lens  implant, bilateral Bilateral 2014-2015  . Tumor removal Right ~ 1976    "arm; had to take bone left hip to add to the repair"  . Bone graft hip iliac crest Left ~ 1976    There were no vitals filed for this visit.  Visit Diagnosis:  Abnormality of gait  Unsteadiness  Weakness generalized      Subjective Assessment - 10/07/15 1116    Subjective Pt reports R eye is "giving me a problem today." Pt denies dizziness or lightheadedness today. 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 No/denies                         [facility] Adult PT Treatment/Exercise - 10/07/15 0001    Ambulation/Gait   Ambulation/Gait Yes   Ambulation/Gait Assistance 5: Supervision   Ambulation/Gait Assistance Details Verbal/tactile cueing focused on upright posture, forward gaze, R heel strike, and reciprocal arm swing (empahsis on R).   Ambulation Distance (Feet) 345 Feet   Assistive device None   Gait Pattern Decreased stance time - right;Decreased arm swing - right;Step-through pattern;Decreased step length - left;Decreased stride length;Decreased hip/knee flexion - right;Right foot flat;Right flexed knee in stance;Antalgic;Trunk rotated posteriorly on right;Decreased trunk rotation;Poor foot clearance - right;Decreased weight shift to right;Trunk flexed  right ankle/foot supination; downward gaze   Ambulation Surface Level;Indoor   Curb 5: Supervision  without AD   Exercises   Exercises Other Exercises  Other Exercises  Reviewed home exercises provided during 2 previous sessions. Pt required use of paper handout and max cueing for proper setup, techinque with all exercises. Modified HEP by removing supine BLE bridging, increasing reps for B clamshells, and decreasing exercise frequency to 1x/day each. With said cueing, pt gave effective return demo.                 PT Education - 10/07/15 1149    Education provided Yes   Education Details Reiterated importance of HEP compliance to maximize functional gains made in PT. Reformatted HEP to decrease frequency of exercises, promote pt compliance.   Person(s) Educated  Patient   Methods Explanation;Demonstration;Verbal cues;Handout;Tactile cues   Comprehension Verbalized understanding;Returned demonstration          PT Short Term Goals - 09/28/15 1100    PT SHORT TERM GOAL #1   Title Patient demonstrates understanding of initial HEP (Target Date: 10/28/2015)   Time 1   Period Months   Status New   PT SHORT TERM GOAL #2   Title Timed Up-Go <13.5 sec to indicate lower fall risk.  (Target Date: 10/28/2015)   Time 1   Period Months   Status New   PT SHORT TERM GOAL #3   Title Berg Balance >45/56 to indicate lower fall risk.  (Target Date: 10/28/2015)   Time 1   Period Months   Status New   PT SHORT TERM GOAL #4   Title Patient ambulates 300' without device scanning environment & negotiating around obstacles with no balance losses.  (Target Date: 10/28/2015)   Time 1   Period Months   Status New           PT Long Term Goals - 09/28/15 1100    PT LONG TERM GOAL #1   Title Patient verbalizes ongoing fitness plan / HEP to progress strength, flexibility, endurance & balance.  (Target Date: 11/26/2015)   Time 2   Period Months   Status New   PT LONG TERM GOAL #2   Title Cognitive TUG increases <15% from standard TUG to indicate lower fall risk.  (Target Date: 11/26/2015)   Time 2   Period Months   Status New   PT LONG TERM GOAL #3   Title Berg Balance >48/56 to indicate lower fall risk.  (Target Date: 11/26/2015)   Time 2   Period Months   Status New   PT LONG TERM GOAL #4   Title Functional Gait Assessment >/= 20/30 to indicate lower fall with gait.  (Target Date: 11/26/2015)   Time 2   Period Months   Status New   PT LONG TERM GOAL #5   Title Patient ambulates 500' outside surfaces including grass, ramps, curbs & stairs (1 rail) modified independent for community mobilty.  (Target Date: 11/26/2015)   Time 2   Period Months   Status New               Plan - 10/07/15 1202    Clinical Impression Statement Session focused on gait  training and modifying HEP to ensure pt safety/compliance. Pt >10 min late for 3rd consecutive PT session. Pt also reports inconsistent performance of HEP. Therefore, current session focused on modifying HEP to decrease frequency and reviewing HEP to facilitate consistent compliance.    Pt will benefit from skilled therapeutic intervention in order to improve on the following deficits Abnormal gait;Decreased activity tolerance;Decreased balance;Decreased coordination;Decreased knowledge of use of DME;Decreased mobility;Decreased range of motion;Decreased strength;Pain  Rehab Potential Good   PT Frequency 2x / week   PT Duration Other (comment)  9 weeks (60 days)   PT Treatment/Interventions ADLs/Self Care Home Management;DME Instruction;Gait training;Functional mobility training;Therapeutic activities;Stair training;Therapeutic exercise;Balance training;Neuromuscular re-education;Patient/family education;Orthotic Fit/Training   PT Next Visit Plan standing balance; selective control in RLE (focus on R glute activation in hip extension/knee flexion); cueing for postural awareness during mobility   Recommended Other Services If pt exhibts ongoing difficulty with carryover of education/HEP, will discuss SLP evaluation.   Consulted and Agree with Plan of Care Patient        Problem List Patient Active Problem List   Diagnosis Date Noted  . TIA (transient ischemic attack) 09/14/2015  . CKD stage 3 due to type 2 diabetes mellitus ([place]) 09/14/2015  . Acute lower GI bleeding 10/30/2014  . Orthostatic hypotension 10/30/2014  . History of colonic polyps 10/30/2014  . Depression 10/30/2014  . Symptomatic anemia 10/30/2014  . AKI (acute kidney injury) ([place]) 10/30/2014  . Ataxic gait 09/21/2014  . History of CVA (cerebrovascular accident) 02/22/2013  . Abnormal brain scan 02/21/2013  . Dizziness 02/21/2013  . Weakness 02/21/2013  . Diabetes mellitus ([place]) 02/21/2013  . Hypertension 02/21/2013    Billie Ruddy, PT, DPT [facility] [address] [place], [place], [address] Phone: [phone]   Fax:  [phone] 10/07/2015, 12:14 PM  Name: Cody Silva MRN: [id number] Date of Birth: 02-Mar-1946

## 2015-10-07 NOTE — Patient Instructions (Addendum)
Achilles / Gastroc, Standing    Stand, right foot behind, heel on floor and turned slightly out, leg straight, forward leg bent. Move hips forward. Hold _30__ seconds. Then bend back knee slightly (you should feel a stretch deep in your calf) and hold for additional 30 seconds.  Repeat _4__ times on each leg, _1_ set per day.   Deep Squat    Stand in front of a stable chair with feet shoulder width apart and squat deeply (as though you're about to sit in the chair) head and chest up. *Make sure your knees aren't moving over your toes. Repeat __15__ times per set. Do _1__ set per day.  SINGLE LIMB STANCE    Stand at counter top and use counter for support. Stance: single leg on floor. Raise leg. Try to hold _10__ seconds. Repeat with other leg. Try to use only one hand and as it gets easier, lessen the amount that you are using the one hand to increase challenge.  _3__ reps per set, _1__ set per day.  Copyright  VHI. All rights reserved.    Bridging (Single Leg)    Lie on back with feet shoulder width apart and left leg straight. Use right leg to lift hips toward the ceiling while keeping leg straight. Hold _2___ seconds. Repeat __10__ times. Do __1__ set per day.  Abduction: Clam (Eccentric) - Side-Lying    Lie on side with knees bent. Lift top knee, keeping feet together. Keep trunk steady. Slowly lower for 3-5 seconds. _20__ reps on each leg. _1__ set per day on each leg.

## 2015-10-12 ENCOUNTER — Ambulatory Visit: Payer: Medicare Other | Admitting: Physical Therapy

## 2015-10-12 DIAGNOSIS — R2689 Other abnormalities of gait and mobility: Secondary | ICD-10-CM

## 2015-10-12 DIAGNOSIS — R2681 Unsteadiness on feet: Secondary | ICD-10-CM

## 2015-10-12 DIAGNOSIS — R269 Unspecified abnormalities of gait and mobility: Secondary | ICD-10-CM | POA: Diagnosis not present

## 2015-10-12 NOTE — Therapy (Signed)
[facility] [address] [place], [place], [address] Phone: [phone]   Fax:  [phone]  Physical Therapy Treatment  Patient Details  Name: Cody Silva MRN: [id number] Date of Birth: [birth year] Referring Provider: [name], MD  Encounter Date: 10/12/2015      PT End of Session - 10/12/15 1355    Visit Number 5   Number of Visits 18   Date for PT Re-Evaluation 11/26/15   Authorization Type Medicare G-Code & progress noted every 10 visits   PT Start Time 1146   PT Stop Time 1232   PT Time Calculation (min) 46 min   Activity Tolerance Patient tolerated treatment well;No increased pain   Behavior During Therapy [facility] for tasks assessed/performed      Past Medical History  Diagnosis Date  . Hypertension   . Diabetic retinopathy ([place])   . Diabetic neuropathy ([place])   . Colon polyps   . History of blood transfusion 10/30/2014    hematochezia  . Anemia   . OSA on CPAP     "suppose to wear mask; I've got a call in for an equipment change" (10/30/2014)  . Type II diabetes mellitus ([place])   . Headache     "@ least 3 times/wk" (10/30/2014)  . Stroke [facility]) 2014    left extremity deficits; facial left  . Arthritis     "right arm, right ankle, right side" (10/30/2014)  . History of gout   . PTSD (post-traumatic stress disorder)     "service related"    Past Surgical History  Procedure Laterality Date  . Cataract extraction w/ intraocular lens  implant, bilateral Bilateral 2014-2015  . Tumor removal Right ~ 1976    "arm; had to take bone left hip to add to the repair"  . Bone graft hip iliac crest Left ~ 1976    There were no vitals filed for this visit.  Visit Diagnosis:  Abnormality of gait  Unsteadiness  Balance problems      Subjective Assessment - 10/12/15 1149    Subjective "I feel some tightness in my (right) leg. I must be doing some work." Pt reports he's been doing home exercises,  "just not as often as I should probably," per pt. Pt also reports "12 out 10 pain" in plantar aspect of R foot at 1st metatarsal head. Pt reports he plans to go to ED to address this immediately after this PT session.   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 Yes   Pain Score 8    Pain Location Shoulder   Pain Orientation Right;Posterior   Pain Descriptors / Indicators Aching   Pain Type Chronic pain   Pain Radiating Towards into R humerus   Pain Onset More than a month ago   Pain Frequency Constant   Aggravating Factors  raising R arm up   Pain Relieving Factors rest   Effect of Pain on Daily Activities limits ADL's   Multiple Pain Sites Yes   Pain Score 10   Pain Location Foot   Pain Orientation Right;Other (Comment)  plantar aspect; 1st metatarsal head   Pain Descriptors / Indicators Sore   Pain Type Chronic pain   Pain Onset More than a month ago   Aggravating  Factors  standing, walking   Pain Relieving Factors rest. sitting                         [facility] Adult PT Treatment/Exercise - 10/12/15 0001    Ambulation/Gait   Ambulation/Gait Yes   Ambulation/Gait Assistance 5: Supervision   Ambulation/Gait Assistance Details Cueing for postural awareness (thoracic spine extension, R scapular retraction/depression), recipocal arm swing), and R heel strike with noted within-session improvement.   Ambulation Distance (Feet) 420 Feet   Assistive device None   Gait Pattern Decreased stance time - right;Decreased arm swing - right;Step-through pattern;Decreased step length - left;Decreased stride length;Decreased hip/knee flexion - right;Right foot flat;Right flexed knee in stance;Antalgic;Decreased trunk rotation;Decreased weight shift to right;Trunk flexed  limited R  hip extension during R stance   Ambulation Surface Level;Indoor   Neuro Re-ed    Neuro Re-ed Details  Seated on blue physioball, pt performed anterior/posterior pelvic tilts x10 reps with demo/tactile cueing from this PT to increase pt awareness of posterior pelvic tilt and impact on shoulder alignment and pain. In L sidelying, performed R scapular PNF, initially pure R scapular retraction, then scapular depression x10 reps, then scapular retraction/depression simultaneously x20 reps, (rhythmic initiation for initial 5 reps, resisted for remaining reps). After stretching R hip flexors (see therex tab), pt performed modified R single leg bridging 3 x5 reps (to pt fatigue) with R hip extension/knee flexion and R foot on 3" step, tactile cueing at R knee for proprioceptive input, and LLE on rolling chair to decrease compensation with LLE. Transitioned to performed the following in tall kneeling for increased R gluteus maximus/medius activation: LLE forward/retro stepping x10 reps per direction with cueing for upright posture, R stance stability; lateral stepping 3 x4 steps per direction with manual assist to advance RLE to R side. Quadruped (for co-contraction, proproprioceptive input), pt advanced RUE forward/retro 4" x8 reps, then L hip extension 2x5 reps for increased weight shift to R side.    Exercises   Exercises Other Exercises   Other Exercises  Supine, hook lying, self-stretch of R iliopsoas, R rectus femoris x2-minute holds each for improved R hip alignment.                  PT Short Term Goals - 09/28/15 1100    PT SHORT TERM GOAL #1   Title Patient demonstrates understanding of initial HEP (Target Date: 10/28/2015)   Time 1   Period Months   Status New   PT SHORT TERM GOAL #2   Title Timed Up-Go <13.5 sec to indicate lower fall risk.  (Target Date: 10/28/2015)   Time 1   Period Months   Status New   PT SHORT TERM GOAL #3   Title Berg Balance >45/56 to indicate lower fall risk.   (Target Date: 10/28/2015)   Time 1   Period Months   Status New   PT SHORT TERM GOAL #4   Title Patient ambulates 300' without device scanning environment & negotiating around obstacles with no balance losses.  (Target Date: 10/28/2015)   Time 1   Period Months   Status New           PT Long Term Goals - 09/28/15 1100    PT LONG TERM GOAL #1   Title Patient verbalizes ongoing fitness plan / HEP to progress strength, flexibility, endurance & balance.  (Target Date: 11/26/2015)   Time 2   Period Months   Status New  PT LONG TERM GOAL #2   Title Cognitive TUG increases <15% from standard TUG to indicate lower fall risk.  (Target Date: 11/26/2015)   Time 2   Period Months   Status New   PT LONG TERM GOAL #3   Title Berg Balance >48/56 to indicate lower fall risk.  (Target Date: 11/26/2015)   Time 2   Period Months   Status New   PT LONG TERM GOAL #4   Title Functional Gait Assessment >/= 20/30 to indicate lower fall with gait.  (Target Date: 11/26/2015)   Time 2   Period Months   Status New   PT LONG TERM GOAL #5   Title Patient ambulates 500' outside surfaces including grass, ramps, curbs & stairs (1 rail) modified independent for community mobilty.  (Target Date: 11/26/2015)   Time 2   Period Months   Status New               Plan - 10/12/15 1355    Clinical Impression Statement Session focused on addressing R shoulder pain, increasing pt awareness of postural alignment, increasing activation of R hip extensors/abductors to improve R stance stability during gait. With cueing for postural alignment and R scapular PNF (retraction, depression), pt noted decreased pain from 8/10 to 4/10 in R shoulder. Spoke with pt about requesting MD order for neuro OT to address R shoulder pain and dysfunction s/p CVA. Pt verbally agreed.    Pt will benefit from skilled therapeutic intervention in order to improve on the following deficits Abnormal gait;Decreased activity  tolerance;Decreased balance;Decreased coordination;Decreased knowledge of use of DME;Decreased mobility;Decreased range of motion;Decreased strength;Pain   Rehab Potential Good   PT Frequency 2x / week   PT Duration Other (comment)  9 weeks (60 days)   PT Treatment/Interventions ADLs/Self Care Home Management;DME Instruction;Gait training;Functional mobility training;Therapeutic activities;Stair training;Therapeutic exercise;Balance training;Neuromuscular re-education;Patient/family education;Orthotic Fit/Training   PT Next Visit Plan Postural alignment in seated/standing; NMR for RLE (selective control of R hip extension), and continue RLE strengthening   Consulted and Agree with Plan of Care Patient        Problem List Patient Active Problem List   Diagnosis Date Noted  . TIA (transient ischemic attack) 09/14/2015  . CKD stage 3 due to type 2 diabetes mellitus ([place]) 09/14/2015  . Acute lower GI bleeding 10/30/2014  . Orthostatic hypotension 10/30/2014  . History of colonic polyps 10/30/2014  . Depression 10/30/2014  . Symptomatic anemia 10/30/2014  . AKI (acute kidney injury) ([place]) 10/30/2014  . Ataxic gait 09/21/2014  . History of CVA (cerebrovascular accident) 02/22/2013  . Abnormal brain scan 02/21/2013  . Dizziness 02/21/2013  . Weakness 02/21/2013  . Diabetes mellitus ([place]) 02/21/2013  . Hypertension 02/21/2013    Billie Ruddy, PT, DPT [facility] [address] [place], [place], [address] Phone: [phone]   Fax:  [phone] 10/12/2015, 2:02 PM   Name: PARKE CONNEELY MRN: [id number] Date of Birth: 08-12-1946

## 2015-10-14 ENCOUNTER — Ambulatory Visit: Payer: Medicare Other | Admitting: Physical Therapy

## 2015-10-19 ENCOUNTER — Ambulatory Visit: Payer: Medicare Other | Admitting: Physical Therapy

## 2015-10-19 ENCOUNTER — Encounter: Payer: Self-pay | Admitting: Physical Therapy

## 2015-10-19 DIAGNOSIS — R2681 Unsteadiness on feet: Secondary | ICD-10-CM

## 2015-10-19 DIAGNOSIS — R269 Unspecified abnormalities of gait and mobility: Secondary | ICD-10-CM | POA: Diagnosis not present

## 2015-10-19 DIAGNOSIS — R2689 Other abnormalities of gait and mobility: Secondary | ICD-10-CM

## 2015-10-19 DIAGNOSIS — M25572 Pain in left ankle and joints of left foot: Secondary | ICD-10-CM

## 2015-10-19 DIAGNOSIS — R531 Weakness: Secondary | ICD-10-CM

## 2015-10-20 NOTE — Therapy (Signed)
[facility] [address] [place], [place], [address] Phone: [phone]   Fax:  [phone]  Physical Therapy Treatment  Patient Details  Name: Cody Silva MRN: [id number] Date of Birth: 1946-09-01 Referring Provider: [name], MD  Encounter Date: 10/19/2015      PT End of Session - 10/19/15 1315    Visit Number 6   Number of Visits 18   Date for PT Re-Evaluation 11/26/15   Authorization Type Medicare G-Code & progress noted every 10 visits   PT Start Time [id number]   PT Stop Time 1315   PT Time Calculation (min) 30 min   Activity Tolerance Patient tolerated treatment well;No increased pain   Behavior During Therapy [facility] for tasks assessed/performed      Past Medical History  Diagnosis Date  . Hypertension   . Diabetic retinopathy ([place])   . Diabetic neuropathy ([place])   . Colon polyps   . History of blood transfusion 10/30/2014    hematochezia  . Anemia   . OSA on CPAP     "suppose to wear mask; I've got a call in for an equipment change" (10/30/2014)  . Type II diabetes mellitus ([place])   . Headache     "@ least 3 times/wk" (10/30/2014)  . Stroke [facility]) 2014    left extremity deficits; facial left  . Arthritis     "right arm, right ankle, right side" (10/30/2014)  . History of gout   . PTSD (post-traumatic stress disorder)     "service related"    Past Surgical History  Procedure Laterality Date  . Cataract extraction w/ intraocular lens  implant, bilateral Bilateral 2014-2015  . Tumor removal Right ~ 1976    "arm; had to take bone left hip to add to the repair"  . Bone graft hip iliac crest Left ~ 1976    There were no vitals filed for this visit.  Visit Diagnosis:  Abnormality of gait  Unsteadiness  Balance problems  Weakness generalized  Pain in joint, ankle and foot, left      Subjective Assessment - 10/19/15 1246    Subjective His back was sore after exercises from last time. [facility]. Referred to [facility] for Diabetic shoes.    Currently in Pain? No/denies                         [facility] Adult PT Treatment/Exercise - 10/19/15 1245    Ambulation/Gait   Ambulation/Gait Yes   Ambulation/Gait Assistance 5: Supervision   Ambulation/Gait Assistance Details cues on step length & posture   Ambulation Distance (Feet) 130 Feet  130' X 2   Assistive device None   Gait Pattern Decreased stance time - right;Decreased arm swing - right;Step-through pattern;Decreased step length - left;Decreased stride length;Decreased hip/knee flexion - right;Right foot flat;Right flexed knee in stance;Antalgic;Decreased trunk rotation;Decreased weight shift to right;Trunk flexed  limited R hip extension during R stance   Ambulation Surface Indoor;Level   Neuro Re-ed    Neuro Re-ed Details  Balance activities in parallel bars for safety & use of miror for visual feedback: standing crossways on foam beam with eyes open with head turns & Alt. stepping forward and backward on/off beam; Rocker board lateral & anterior /posterior stabilization & wt shifts.  PT Short Term Goals - 09/28/15 1100    PT SHORT TERM GOAL #1   Title Patient demonstrates understanding of initial HEP (Target Date: 10/28/2015)   Time 1   Period Months   Status New   PT SHORT TERM GOAL #2   Title Timed Up-Go <13.5 sec to indicate lower fall risk.  (Target Date: 10/28/2015)   Time 1   Period Months   Status New   PT SHORT TERM GOAL #3   Title Berg Balance >45/56 to indicate lower fall risk.  (Target Date: 10/28/2015)   Time 1   Period Months   Status New   PT SHORT TERM GOAL #4   Title Patient ambulates 300' without device scanning environment & negotiating around obstacles with no balance losses.  (Target Date: 10/28/2015)   Time 1   Period Months   Status New           PT Long Term Goals - 09/28/15 1100    PT  LONG TERM GOAL #1   Title Patient verbalizes ongoing fitness plan / HEP to progress strength, flexibility, endurance & balance.  (Target Date: 11/26/2015)   Time 2   Period Months   Status New   PT LONG TERM GOAL #2   Title Cognitive TUG increases <15% from standard TUG to indicate lower fall risk.  (Target Date: 11/26/2015)   Time 2   Period Months   Status New   PT LONG TERM GOAL #3   Title Berg Balance >48/56 to indicate lower fall risk.  (Target Date: 11/26/2015)   Time 2   Period Months   Status New   PT LONG TERM GOAL #4   Title Functional Gait Assessment >/= 20/30 to indicate lower fall with gait.  (Target Date: 11/26/2015)   Time 2   Period Months   Status New   PT LONG TERM GOAL #5   Title Patient ambulates 500' outside surfaces including grass, ramps, curbs & stairs (1 rail) modified independent for community mobilty.  (Target Date: 11/26/2015)   Time 2   Period Months   Status New               Plan - 10/19/15 1315    Clinical Impression Statement patient improved his balance reactions with alternate activiites with skilled instruction & repetition. Patient reports no back discomfort at end of session today.    Pt will benefit from skilled therapeutic intervention in order to improve on the following deficits Abnormal gait;Decreased activity tolerance;Decreased balance;Decreased coordination;Decreased knowledge of use of DME;Decreased mobility;Decreased range of motion;Decreased strength;Pain   Rehab Potential Good   PT Frequency 2x / week   PT Duration Other (comment)  9 weeks (60 days)   PT Treatment/Interventions ADLs/Self Care Home Management;DME Instruction;Gait training;Functional mobility training;Therapeutic activities;Stair training;Therapeutic exercise;Balance training;Neuromuscular re-education;Patient/family education;Orthotic Fit/Training   PT Next Visit Plan Postural alignment in seated/standing; NMR for RLE (selective control of R hip extension), and  continue RLE strengthening   Consulted and Agree with Plan of Care Patient        Problem List Patient Active Problem List   Diagnosis Date Noted  . TIA (transient ischemic attack) 09/14/2015  . CKD stage 3 due to type 2 diabetes mellitus ([place]) 09/14/2015  . Acute lower GI bleeding 10/30/2014  . Orthostatic hypotension 10/30/2014  . History of colonic polyps 10/30/2014  . Depression 10/30/2014  . Symptomatic anemia 10/30/2014  . AKI (acute kidney injury) ([place]) 10/30/2014  . Ataxic gait 09/21/2014  . History  of CVA (cerebrovascular accident) 02/22/2013  . Abnormal brain scan 02/21/2013  . Dizziness 02/21/2013  . Weakness 02/21/2013  . Diabetes mellitus ([place]) 02/21/2013  . Hypertension 02/21/2013    Dorethia Jeanmarie PT, DPT 10/20/2015, 12:15 PM  [facility] [address] [place], [place], [address] Phone: [phone]   Fax:  [phone]  Name: Cody Silva MRN: [id number] Date of Birth: Feb 22, 1946

## 2015-10-21 ENCOUNTER — Encounter: Payer: Self-pay | Admitting: Physical Therapy

## 2015-10-21 ENCOUNTER — Ambulatory Visit: Payer: Medicare Other | Admitting: Physical Therapy

## 2015-10-21 DIAGNOSIS — R269 Unspecified abnormalities of gait and mobility: Secondary | ICD-10-CM

## 2015-10-21 DIAGNOSIS — W19XXXS Unspecified fall, sequela: Secondary | ICD-10-CM

## 2015-10-21 DIAGNOSIS — R2681 Unsteadiness on feet: Secondary | ICD-10-CM

## 2015-10-21 DIAGNOSIS — R531 Weakness: Secondary | ICD-10-CM

## 2015-10-21 DIAGNOSIS — R2689 Other abnormalities of gait and mobility: Secondary | ICD-10-CM

## 2015-10-21 NOTE — Therapy (Signed)
[facility] [address] [place], [place], [address] Phone: [phone]   Fax:  [phone]  Physical Therapy Treatment  Patient Details  Name: Cody Silva MRN: [id number] Date of Birth: Jul 15, 1946 Referring Provider: [name], MD  Encounter Date: 10/21/2015      PT End of Session - 10/21/15 1108    Visit Number 7   Number of Visits 18   Date for PT Re-Evaluation 11/26/15   Authorization Type Medicare G-Code & progress noted every 10 visits   PT Start Time 1103   PT Stop Time 1145   PT Time Calculation (min) 42 min   Activity Tolerance Patient tolerated treatment well;No increased pain   Behavior During Therapy [facility] for tasks assessed/performed      Past Medical History  Diagnosis Date  . Hypertension   . Diabetic retinopathy ([place])   . Diabetic neuropathy ([place])   . Colon polyps   . History of blood transfusion 10/30/2014    hematochezia  . Anemia   . OSA on CPAP     "suppose to wear mask; I've got a call in for an equipment change" (10/30/2014)  . Type II diabetes mellitus ([place])   . Headache     "@ least 3 times/wk" (10/30/2014)  . Stroke [facility]) 2014    left extremity deficits; facial left  . Arthritis     "right arm, right ankle, right side" (10/30/2014)  . History of gout   . PTSD (post-traumatic stress disorder)     "service related"    Past Surgical History  Procedure Laterality Date  . Cataract extraction w/ intraocular lens  implant, bilateral Bilateral 2014-2015  . Tumor removal Right ~ 1976    "arm; had to take bone left hip to add to the repair"  . Bone graft hip iliac crest Left ~ 1976    There were no vitals filed for this visit.  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 Yes   Pain Score 6    Pain Location Shoulder   Pain Orientation Right   Pain Descriptors / Indicators Aching;Sore   Pain Type Chronic pain   Pain Radiating Towards up into neck and down to elbow   Pain Onset More than a month ago   Pain Frequency Constant   Aggravating Factors  movement   Pain Relieving Factors rest, tylenol            [facility] Adult PT Treatment/Exercise - 10/21/15 1114    Ambulation/Gait   Ambulation/Gait Yes   Ambulation/Gait Assistance 5: Supervision   Ambulation/Gait Assistance Details cues on forward gaze, equal step lenght and for reciprocal arm swing   Ambulation Distance (Feet) 420 Feet   Assistive device None   Gait Pattern Decreased stance time - right;Decreased arm swing - right;Step-through pattern;Decreased step length - left;Decreased stride length;Decreased hip/knee flexion - right;Right foot flat;Right flexed knee in stance;Antalgic;Decreased trunk rotation;Decreased weight shift to right;Trunk flexed   Ambulation Surface Level;Indoor   High Level Balance   High Level Balance Activities Tandem walking;Marching forwards;Marching backwards  tandem, heel, toe walk all fwd/bwd   High Level Balance Comments on red mats with  no AD/UE support: 3 laps each/each way with min assist to mod assist for balance and cues on posture, weight shifting and to slow down for increased stance stability,.            Balance Exercises - 10/21/15 1132    Balance Exercises: Standing   SLS with Vectors Foam/compliant surface;Other reps (comment);Limitations   Balance Exercises: Standing   SLS with Vectors Limitations 2 tall cones on bliue mat: alternating fwd toe taps, cross toe taps, fwd double toe taps, and cross over double toe taps x 10 reps each bil legs with up to min assist for balance. cues on posture and weight shifting/slowing down for stance stability.              PT Short Term Goals - 09/28/15 1100    PT SHORT TERM GOAL #1   Title Patient demonstrates understanding of initial HEP (Target Date: 10/28/2015)   Time 1   Period Months   Status New   PT SHORT TERM GOAL #2   Title Timed Up-Go <13.5 sec to indicate lower fall risk.  (Target Date: 10/28/2015)   Time 1   Period Months   Status New   PT SHORT TERM GOAL #3   Title Berg Balance >45/56 to indicate lower fall risk.  (Target Date: 10/28/2015)   Time 1   Period Months   Status New   PT SHORT TERM GOAL #4   Title Patient ambulates 300' without device scanning environment & negotiating around obstacles with no balance losses.  (Target Date: 10/28/2015)   Time 1   Period Months   Status New           PT Long Term Goals - 09/28/15 1100    PT LONG TERM GOAL #1   Title Patient verbalizes ongoing fitness plan / HEP to progress strength, flexibility, endurance & balance.  (Target Date: 11/26/2015)   Time 2   Period Months   Status New   PT LONG TERM GOAL #2   Title Cognitive TUG increases <15% from standard TUG to indicate lower fall risk.  (Target Date: 11/26/2015)   Time 2   Period Months   Status New   PT LONG TERM GOAL #3   Title Berg Balance >48/56 to indicate lower fall risk.  (Target Date: 11/26/2015)   Time 2   Period Months   Status New   PT LONG TERM GOAL #4   Title Functional Gait Assessment >/= 20/30 to indicate lower fall with gait.  (Target Date: 11/26/2015)   Time 2   Period Months   Status New   PT LONG TERM GOAL #5   Title Patient ambulates 500' outside surfaces including grass, ramps, curbs & stairs (1 rail) modified independent for community mobilty.  (Target Date: 11/26/2015)   Time 2   Period Months   Status New           Plan - 10/21/15 1108    Clinical Impression Statement Skilled session continued to focus on gait without AD and on high level balance with no issues reported. Continues to demo decreased balance reactions on complaint surfaces  and with singe leg stance activities. Pt is making steady progress toward goals.    Pt will benefit from skilled therapeutic intervention in order to improve on the following deficits Abnormal gait;Decreased activity tolerance;Decreased balance;Decreased coordination;Decreased knowledge of use of DME;Decreased mobility;Decreased range of motion;Decreased strength;Pain   Rehab Potential Good   PT Frequency 2x / week   PT  Duration Other (comment)  9 weeks (60 days)   PT Treatment/Interventions ADLs/Self Care Home Management;DME Instruction;Gait training;Functional mobility training;Therapeutic activities;Stair training;Therapeutic exercise;Balance training;Neuromuscular re-education;Patient/family education;Orthotic Fit/Training   PT Next Visit Plan Postural alignment in seated/standing; NMR for RLE (selective control of R hip extension), and continue RLE strengthening   Consulted and Agree with Plan of Care Patient        Problem List Patient Active Problem List   Diagnosis Date Noted  . TIA (transient ischemic attack) 09/14/2015  . CKD stage 3 due to type 2 diabetes mellitus ([place]) 09/14/2015  . Acute lower GI bleeding 10/30/2014  . Orthostatic hypotension 10/30/2014  . History of colonic polyps 10/30/2014  . Depression 10/30/2014  . Symptomatic anemia 10/30/2014  . AKI (acute kidney injury) ([place]) 10/30/2014  . Ataxic gait 09/21/2014  . History of CVA (cerebrovascular accident) 02/22/2013  . Abnormal brain scan 02/21/2013  . Dizziness 02/21/2013  . Weakness 02/21/2013  . Diabetes mellitus ([place]) 02/21/2013  . Hypertension 02/21/2013    Willow Ora 10/22/2015, 10:24 AM  Willow Ora, PTA, [facility] [address] [place], [place] [address] [phone] 10/22/2015, 10:24 AM   Name: Cody Silva MRN: [id number] Date of Birth: Jan 14, 1946

## 2015-10-26 ENCOUNTER — Ambulatory Visit: Payer: Medicare Other | Admitting: Physical Therapy

## 2015-10-28 ENCOUNTER — Ambulatory Visit: Payer: Medicare Other | Admitting: Physical Therapy

## 2015-11-02 ENCOUNTER — Ambulatory Visit: Payer: Medicare Other | Admitting: Physical Therapy

## 2015-11-02 ENCOUNTER — Encounter: Payer: Self-pay | Admitting: Physical Therapy

## 2015-11-02 DIAGNOSIS — M25572 Pain in left ankle and joints of left foot: Secondary | ICD-10-CM

## 2015-11-02 DIAGNOSIS — R269 Unspecified abnormalities of gait and mobility: Secondary | ICD-10-CM | POA: Diagnosis not present

## 2015-11-02 DIAGNOSIS — R2681 Unsteadiness on feet: Secondary | ICD-10-CM

## 2015-11-02 DIAGNOSIS — R531 Weakness: Secondary | ICD-10-CM

## 2015-11-02 DIAGNOSIS — W19XXXS Unspecified fall, sequela: Secondary | ICD-10-CM

## 2015-11-02 DIAGNOSIS — R2689 Other abnormalities of gait and mobility: Secondary | ICD-10-CM

## 2015-11-02 NOTE — Therapy (Signed)
[facility] [address] [place], [place], [address] Phone: [phone]   Fax:  [phone]  Physical Therapy Treatment  Patient Details  Name: Cody Silva MRN: [id number] Date of Birth: August 13, 1946 Referring Provider: [name], MD  Encounter Date: 11/02/2015      PT End of Session - 11/02/15 1150    Visit Number 8   Number of Visits 18   Date for PT Re-Evaluation 11/26/15   Authorization Type Medicare G-Code & progress noted every 10 visits   PT Start Time 1114   PT Stop Time 1152   PT Time Calculation (min) 38 min   Activity Tolerance Patient tolerated treatment well;No increased pain   Behavior During Therapy [facility] for tasks assessed/performed      Past Medical History  Diagnosis Date  . Hypertension   . Diabetic retinopathy ([place])   . Diabetic neuropathy ([place])   . Colon polyps   . History of blood transfusion 10/30/2014    hematochezia  . Anemia   . OSA on CPAP     "suppose to wear mask; I've got a call in for an equipment change" (10/30/2014)  . Type II diabetes mellitus ([place])   . Headache     "@ least 3 times/wk" (10/30/2014)  . Stroke [facility]) 2014    left extremity deficits; facial left  . Arthritis     "right arm, right ankle, right side" (10/30/2014)  . History of gout   . PTSD (post-traumatic stress disorder)     "service related"    Past Surgical History  Procedure Laterality Date  . Cataract extraction w/ intraocular lens  implant, bilateral Bilateral 2014-2015  . Tumor removal Right ~ 1976    "arm; had to take bone left hip to add to the repair"  . Bone graft hip iliac crest Left ~ 1976    There were no vitals filed for this visit.  Visit Diagnosis:  Abnormality of gait  Unsteadiness  Balance problems  Weakness generalized  Falls, sequela  Pain in joint, ankle and foot, left      Subjective Assessment - 11/02/15 1118    Subjective He had eye surgery last week on  10/26/2015 to have pocket of fluid  behind corona removed. He does not have a follow-up for a few weeks. Patient is uncertain of any restrictions.    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 Yes   Pain Score 7    Pain Location Shoulder   Pain Orientation Right   Pain Descriptors / Indicators Aching;Sore   Pain Type Chronic pain   Pain Onset More than a month ago   Pain Frequency Constant   Aggravating Factors  movement   Pain Relieving Factors rest, tylenol   Multiple Pain Sites Yes   Pain Score 5  can instantly go to 10/10 for 10-20 seconds   Pain Location Foot   Pain Orientation Right   Pain Descriptors / Indicators Sharp   Pain Type Chronic pain   Pain Onset More than a month ago   Pain Frequency Intermittent   Aggravating Factors  standing, walking   Pain Relieving Factors rest, sitting     PT spoke with Juliann Pulse, RN at [facility] reports no activity restrictions after  eye procedure last week.         [facility] PT Assessment - 11/02/15 1100    Berg Balance Test   Sit to Stand Able to stand without using hands and stabilize independently   Standing Unsupported Able to stand safely 2 minutes   Sitting with Back Unsupported but Feet Supported on Floor or Stool Able to sit safely and securely 2 minutes   Stand to Sit Sits safely with minimal use of hands   Transfers Able to transfer safely, minor use of hands   Standing Unsupported with Eyes Closed Able to stand 10 seconds safely   Standing Ubsupported with Feet Together Able to place feet together independently and stand 1 minute safely   From Standing, Reach Forward with Outstretched Arm Can reach forward >12 cm safely (5")   From Standing Position, Pick up Object from Floor Able to pick up shoe safely and easily    From Standing Position, Turn to Look Behind Over each Shoulder Looks behind from both sides and weight shifts well   Turn 360 Degrees Able to turn 360 degrees safely but slowly   Standing Unsupported, Alternately Place Feet on Step/Stool Able to complete 4 steps without aid or supervision   Standing Unsupported, One Foot in Front Able to plae foot ahead of the other independently and hold 30 seconds   Standing on One Leg Tries to lift leg/unable to hold 3 seconds but remains standing independently   Total Score 47   Timed Up and Go Test   Normal TUG (seconds) 11.08   Cognitive TUG (seconds) 15.16                     [facility] Adult PT Treatment/Exercise - 11/02/15 1100    Ambulation/Gait   Ambulation/Gait Yes   Ambulation/Gait Assistance 5: Supervision   Ambulation/Gait Assistance Details cues on step length & posture   Ambulation Distance (Feet) 500 Feet   Assistive device None   Gait Pattern Decreased stance time - right;Decreased arm swing - right;Step-through pattern;Decreased step length - left;Decreased stride length;Decreased hip/knee flexion - right;Right foot flat;Right flexed knee in stance;Antalgic;Decreased trunk rotation;Decreased weight shift to right;Trunk flexed   Ambulation Surface Indoor;Level;Outdoor;Paved;Gravel;Grass             Balance Exercises - 11/02/15 1100    Balance Exercises: Standing   Rockerboard Anterior/posterior;Lateral;Head turns;EO  weight shifts with stabilization   Balance Beam crossways on foam beam: EO head turns with manual, tactile & visual (mirror) feedback    Gait with Head Turns Forward  with supervision             PT Short Term Goals - 11/02/15 1211    PT SHORT TERM GOAL #1   Title Patient demonstrates understanding of initial HEP (Target Date: 10/28/2015)   Baseline MET 11/02/2015   Time 1   Period Months   Status Achieved   PT SHORT TERM GOAL #2   Title Timed Up-Go <13.5 sec to indicate lower fall risk.   (Target Date: 10/28/2015)   Baseline MET 11/02/2015   Time 1   Period Months   Status Achieved   PT SHORT TERM GOAL #3   Title Berg Balance >45/56 to indicate lower fall risk.  (Target Date: 10/28/2015)   Baseline MET 11/02/2015   Time 1   Period Months   Status Achieved   PT SHORT TERM GOAL #4   Title Patient ambulates 300' without device scanning environment & negotiating around obstacles with no  balance losses.  (Target Date: 10/28/2015)   Baseline MET 11/02/2015   Time 1   Period Months   Status Achieved           PT Long Term Goals - 11/02/15 1212    PT LONG TERM GOAL #1   Title Patient verbalizes ongoing fitness plan / HEP to progress strength, flexibility, endurance & balance.  (Target Date: 11/26/2015)   Time 2   Period Months   Status On-going   PT LONG TERM GOAL #2   Title Cognitive TUG increases <15% from standard TUG to indicate lower fall risk.  (Target Date: 11/26/2015)   Time 2   Period Months   Status On-going   PT LONG TERM GOAL #3   Title Berg Balance >48/56 to indicate lower fall risk.  (Target Date: 11/26/2015)   Time 2   Period Months   Status On-going   PT LONG TERM GOAL #4   Title Functional Gait Assessment >/= 20/30 to indicate lower fall with gait.  (Target Date: 11/26/2015)   Time 2   Period Months   Status On-going   PT LONG TERM GOAL #5   Title Patient ambulates 500' outside surfaces including grass, ramps, curbs & stairs (1 rail) modified independent for community mobilty.  (Target Date: 11/26/2015)   Time 2   Period Months   Status On-going               Plan - 11/02/15 1213    Clinical Impression Statement PT called to recieve clearance for PT activities with Eye surgery office and Juliann Pulse, RN okayed continuation of PT. Patient met all STGs and is progressing towards LTGs. Patient improved Berg Balance to 47/56 from initial score of 40/56 indicating lower fall risk.    Pt will benefit from skilled therapeutic intervention in order to  improve on the following deficits Abnormal gait;Decreased activity tolerance;Decreased balance;Decreased coordination;Decreased knowledge of use of DME;Decreased mobility;Decreased range of motion;Decreased strength;Pain   Rehab Potential Good   PT Frequency 2x / week   PT Duration Other (comment)  9 weeks (60 days)   PT Treatment/Interventions ADLs/Self Care Home Management;DME Instruction;Gait training;Functional mobility training;Therapeutic activities;Stair training;Therapeutic exercise;Balance training;Neuromuscular re-education;Patient/family education;Orthotic Fit/Training   PT Next Visit Plan NMR for RLE (selective control of R hip extension), and continue RLE strengthening   Consulted and Agree with Plan of Care Patient        Problem List Patient Active Problem List   Diagnosis Date Noted  . TIA (transient ischemic attack) 09/14/2015  . CKD stage 3 due to type 2 diabetes mellitus ([place]) 09/14/2015  . Acute lower GI bleeding 10/30/2014  . Orthostatic hypotension 10/30/2014  . History of colonic polyps 10/30/2014  . Depression 10/30/2014  . Symptomatic anemia 10/30/2014  . AKI (acute kidney injury) ([place]) 10/30/2014  . Ataxic gait 09/21/2014  . History of CVA (cerebrovascular accident) 02/22/2013  . Abnormal brain scan 02/21/2013  . Dizziness 02/21/2013  . Weakness 02/21/2013  . Diabetes mellitus ([place]) 02/21/2013  . Hypertension 02/21/2013    Jamey Reas PT, DPT 11/02/2015, 12:17 PM  [facility] [address] [place], [place], [address] Phone: [phone]   Fax:  [phone]  Name: JAAN FISCHEL MRN: [id number] Date of Birth: 03-21-1946

## 2015-11-03 ENCOUNTER — Telehealth: Payer: Self-pay | Admitting: Neurology

## 2015-11-03 NOTE — Telephone Encounter (Signed)
Rn Nurse, learning disability at [facility]. Rn explain to [facility] that Dr. Leonie Man has spoken to Dr. Jossie Ng today about the patient. Misty stated that there is a note that Dr. Jossie Ng has spoken with Dr. Leonie Man about the patient. Rn stated per Dr. Leonie Man patient is to hold plavix for 5 days prior to procedure.

## 2015-11-03 NOTE — Telephone Encounter (Signed)
Misty with [facility] Neuro[facility] called to have Dr. Leonie Man speak with Dr. Jossie Ng. Please have him call [phone]. Pt is currently in the office

## 2015-11-04 ENCOUNTER — Ambulatory Visit: Payer: Medicare Other | Attending: Internal Medicine | Admitting: Physical Therapy

## 2015-11-04 ENCOUNTER — Encounter: Payer: Self-pay | Admitting: Physical Therapy

## 2015-11-04 DIAGNOSIS — R2681 Unsteadiness on feet: Secondary | ICD-10-CM | POA: Diagnosis present

## 2015-11-04 DIAGNOSIS — R29818 Other symptoms and signs involving the nervous system: Secondary | ICD-10-CM | POA: Diagnosis present

## 2015-11-04 DIAGNOSIS — R531 Weakness: Secondary | ICD-10-CM

## 2015-11-04 DIAGNOSIS — W19XXXS Unspecified fall, sequela: Secondary | ICD-10-CM | POA: Diagnosis not present

## 2015-11-04 DIAGNOSIS — R269 Unspecified abnormalities of gait and mobility: Secondary | ICD-10-CM | POA: Diagnosis present

## 2015-11-04 DIAGNOSIS — R2689 Other abnormalities of gait and mobility: Secondary | ICD-10-CM

## 2015-11-04 NOTE — Therapy (Signed)
[facility] [address] [place], [place], [address] Phone: [phone]   Fax:  [phone]  Physical Therapy Treatment  Patient Details  Name: Cody Silva MRN: [id number] Date of Birth: Oct 28, 1945 Referring Provider: [name], MD  Encounter Date: 11/04/2015      PT End of Session - 11/04/15 1213    Visit Number 9   Number of Visits 18   Date for PT Re-Evaluation 11/26/15   Authorization Type Medicare G-Code & progress noted every 10 visits   PT Start Time 1105   PT Stop Time 1150   PT Time Calculation (min) 45 min   Equipment Utilized During Treatment Gait belt   Activity Tolerance Patient tolerated treatment well;No increased pain   Behavior During Therapy [facility] for tasks assessed/performed      Past Medical History  Diagnosis Date  . Hypertension   . Diabetic retinopathy ([place])   . Diabetic neuropathy ([place])   . Colon polyps   . History of blood transfusion 10/30/2014    hematochezia  . Anemia   . OSA on CPAP     "suppose to wear mask; I've got a call in for an equipment change" (10/30/2014)  . Type II diabetes mellitus ([place])   . Headache     "@ least 3 times/wk" (10/30/2014)  . Stroke [facility]) 2014    left extremity deficits; facial left  . Arthritis     "right arm, right ankle, right side" (10/30/2014)  . History of gout   . PTSD (post-traumatic stress disorder)     "service related"    Past Surgical History  Procedure Laterality Date  . Cataract extraction w/ intraocular lens  implant, bilateral Bilateral 2014-2015  . Tumor removal Right ~ 1976    "arm; had to take bone left hip to add to the repair"  . Bone graft hip iliac crest Left ~ 1976    There were no vitals filed for this visit.  Visit Diagnosis:  Abnormality of gait  Unsteadiness  Balance problems  Weakness generalized      Subjective Assessment - 11/04/15 1206    Subjective He reports no issues since last PT  appointment. His leg seems to be getting stronger.    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 No/denies                         [facility] Adult PT Treatment/Exercise - 11/04/15 1100    Ambulation/Gait   Ambulation/Gait Yes   Ambulation/Gait Assistance 5: Supervision   Ambulation/Gait Assistance Details scanning while ambulating   Ambulation Distance (Feet) 500 Feet   Assistive device None   Gait Pattern Decreased stance time - right;Decreased arm swing - right;Step-through pattern;Decreased step length - left;Decreased stride length;Decreased hip/knee flexion - right;Right foot flat;Right flexed knee in stance;Antalgic;Decreased trunk rotation;Decreased weight shift to right;Trunk flexed   Ambulation Surface Indoor;Level   Exercises   Exercises Knee/Hip   Knee/Hip Exercises: Aerobic   Tread Mill 2.0 mph X 3 min with UE support with tactile cues on pelvic motion; verbal cues on step length.    Knee/Hip Exercises: Machines for Strengthening   Total Gym Leg Press BLE 110# 15 reps; BLE ankle PF 100# 15 reps;  RLE only 75# 15 reps. PT tactile & verbal cues for control especially knee   Knee/Hip Exercises: Standing   Lateral Step Up Right;Left;10 reps;Step Height: 8"   Lateral Step Up Limitations cues on knee control   Forward Step Up Right;10 reps;Step Height: 8"   Forward Step Up Limitations tactile cues on knee control   Step Down Right;10 reps;Step Height: 8"   Step Down Limitations tactile cues on knee control             Balance Exercises - 11/04/15 1100    Balance Exercises: Standing   Rockerboard Anterior/posterior;Lateral;Head turns;EO;EC;10 seconds  weight shifts with stabilization   Step Ups Forward;Lateral  8" step in parallel bars for safety   Other  Standing Exercises Alternate LE stepping onto BOSU and tapping marker 6" higher than BOSU with intermittent UE support and manual cues for balance reactions             PT Short Term Goals - 11/02/15 1211    PT SHORT TERM GOAL #1   Title Patient demonstrates understanding of initial HEP (Target Date: 10/28/2015)   Baseline MET 11/02/2015   Time 1   Period Months   Status Achieved   PT SHORT TERM GOAL #2   Title Timed Up-Go <13.5 sec to indicate lower fall risk.  (Target Date: 10/28/2015)   Baseline MET 11/02/2015   Time 1   Period Months   Status Achieved   PT SHORT TERM GOAL #3   Title Berg Balance >45/56 to indicate lower fall risk.  (Target Date: 10/28/2015)   Baseline MET 11/02/2015   Time 1   Period Months   Status Achieved   PT SHORT TERM GOAL #4   Title Patient ambulates 300' without device scanning environment & negotiating around obstacles with no balance losses.  (Target Date: 10/28/2015)   Baseline MET 11/02/2015   Time 1   Period Months   Status Achieved           PT Long Term Goals - 11/02/15 1212    PT LONG TERM GOAL #1   Title Patient verbalizes ongoing fitness plan / HEP to progress strength, flexibility, endurance & balance.  (Target Date: 11/26/2015)   Time 2   Period Months   Status On-going   PT LONG TERM GOAL #2   Title Cognitive TUG increases <15% from standard TUG to indicate lower fall risk.  (Target Date: 11/26/2015)   Time 2   Period Months   Status On-going   PT LONG TERM GOAL #3   Title Berg Balance >48/56 to indicate lower fall risk.  (Target Date: 11/26/2015)   Time 2   Period Months   Status On-going   PT LONG TERM GOAL #4   Title Functional Gait Assessment >/= 20/30 to indicate lower fall with gait.  (Target Date: 11/26/2015)   Time 2   Period Months   Status On-going   PT LONG TERM GOAL #5   Title Patient ambulates 500' outside surfaces including grass, ramps, curbs & stairs (1 rail) modified independent for community mobilty.   (Target Date: 11/26/2015)   Time 2   Period Months   Status On-going               Plan - 11/04/15 1214    Clinical Impression Statement Patient improved control of RLE with progressive activities. Leg Press machine appears to facilitate strength gains in right LE. Patient is on target to meet LTGs.    Pt will benefit  from skilled therapeutic intervention in order to improve on the following deficits Abnormal gait;Decreased activity tolerance;Decreased balance;Decreased coordination;Decreased knowledge of use of DME;Decreased mobility;Decreased range of motion;Decreased strength;Pain   Rehab Potential Good   PT Frequency 2x / week   PT Duration Other (comment)   PT Treatment/Interventions ADLs/Self Care Home Management;DME Instruction;Gait training;Functional mobility training;Therapeutic activities;Stair training;Therapeutic exercise;Balance training;Neuromuscular re-education;Patient/family education;Orthotic Fit/Training   PT Next Visit Plan NMR for RLE (selective control of R hip extension), and continue RLE strengthening   Consulted and Agree with Plan of Care Patient        Problem List Patient Active Problem List   Diagnosis Date Noted  . TIA (transient ischemic attack) 09/14/2015  . CKD stage 3 due to type 2 diabetes mellitus ([place]) 09/14/2015  . Acute lower GI bleeding 10/30/2014  . Orthostatic hypotension 10/30/2014  . History of colonic polyps 10/30/2014  . Depression 10/30/2014  . Symptomatic anemia 10/30/2014  . AKI (acute kidney injury) ([place]) 10/30/2014  . Ataxic gait 09/21/2014  . History of CVA (cerebrovascular accident) 02/22/2013  . Abnormal brain scan 02/21/2013  . Dizziness 02/21/2013  . Weakness 02/21/2013  . Diabetes mellitus ([place]) 02/21/2013  . Hypertension 02/21/2013    Moxon Messler PT, DPT  11/04/2015, 12:27 PM  [facility] [address] [place], [place], [address] Phone:  [phone]   Fax:  [phone]  Name: DARRAN GABAY MRN: [id number] Date of Birth: May 23, 1946

## 2015-11-09 ENCOUNTER — Ambulatory Visit: Payer: Medicare Other | Admitting: Physical Therapy

## 2015-11-09 DIAGNOSIS — R269 Unspecified abnormalities of gait and mobility: Secondary | ICD-10-CM | POA: Diagnosis not present

## 2015-11-09 DIAGNOSIS — R531 Weakness: Secondary | ICD-10-CM

## 2015-11-09 DIAGNOSIS — R2681 Unsteadiness on feet: Secondary | ICD-10-CM

## 2015-11-09 NOTE — Patient Instructions (Signed)
Achilles / Gastroc, Standing    Stand, right foot behind, heel on floor and turned slightly out, leg straight, forward leg bent. Move hips forward. Hold _30__ seconds. Then bend back knee slightly (you should feel a stretch deep in your calf) and hold for additional 30 seconds.  Repeat _3__ times on each leg each day.    HIP: Hamstrings - Short Sitting    Rest leg on raised surface. Keep knee straight. Lift chest and hinge at waist. You should feel a stretch in the back of your leg.  Hold _45__ seconds. Perform this stretch 3 times per day on each leg.  Copyright  VHI. All rights reserved.    Standing facing your countertop with both hands on counter for support, raise heels, then rock back on heels and raise toes. Repeat __20__ times. Perform 2 times a day.

## 2015-11-09 NOTE — Therapy (Signed)
[facility] [address] [place], [place], [address] Phone: [phone]   Fax:  [phone]  Physical Therapy Treatment  Patient Details  Name: Cody Silva MRN: [id number] Date of Birth: June 17, 1946 Referring Provider: [name], MD  Encounter Date: 11/09/2015      PT End of Session - 11/09/15 1700    Visit Number 10   Number of Visits 18   Date for PT Re-Evaluation 11/26/15   Authorization Type Medicare G-Code & progress noted every 10 visits   PT Start Time 1409  Pt arrived late to session   PT Stop Time 1449   PT Time Calculation (min) 40 min   Equipment Utilized During Treatment Gait belt   Activity Tolerance Patient tolerated treatment well;No increased pain   Behavior During Therapy [facility] for tasks assessed/performed      Past Medical History  Diagnosis Date  . Hypertension   . Diabetic retinopathy ([place])   . Diabetic neuropathy ([place])   . Colon polyps   . History of blood transfusion 10/30/2014    hematochezia  . Anemia   . OSA on CPAP     "suppose to wear mask; I've got a call in for an equipment change" (10/30/2014)  . Type II diabetes mellitus ([place])   . Headache     "@ least 3 times/wk" (10/30/2014)  . Stroke [facility]) 2014    left extremity deficits; facial left  . Arthritis     "right arm, right ankle, right side" (10/30/2014)  . History of gout   . PTSD (post-traumatic stress disorder)     "service related"    Past Surgical History  Procedure Laterality Date  . Cataract extraction w/ intraocular lens  implant, bilateral Bilateral 2014-2015  . Tumor removal Right ~ 1976    "arm; had to take bone left hip to add to the repair"  . Bone graft hip iliac crest Left ~ 1976    There were no vitals filed for this visit.  Visit Diagnosis:  Abnormality of gait  Unsteadiness  Weakness generalized      Subjective Assessment - 11/09/15 1649    Subjective "I think therapy is helping. 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 Yes   Pain Score 8    Pain Location Shoulder   Pain Orientation Right   Pain Descriptors / Indicators Aching;Sore   Pain Type Chronic pain   Pain Radiating Towards down R humerus   Pain Onset More than a month ago   Pain Frequency Constant   Aggravating Factors  movement   Pain Relieving Factors rest, tylenol   Effect of Pain on Daily Activities limits ADL's   Multiple Pain Sites No            [facility] PT Assessment - 11/09/15 0001    Berg Balance Test   Sit to Stand Able to stand without using hands and stabilize independently   Standing Unsupported Able to stand safely 2 minutes   Sitting with Back Unsupported but Feet Supported on Floor or Stool Able to sit safely and securely 2 minutes   Stand to Sit Sits safely with minimal use of  hands   Transfers Able to transfer safely, minor use of hands   Standing Unsupported with Eyes Closed Able to stand 10 seconds safely   Standing Ubsupported with Feet Together Able to place feet together independently and stand 1 minute safely   From Standing, Reach Forward with Outstretched Arm Can reach confidently >25 cm (10")   From Standing Position, Pick up Object from Floor Able to pick up shoe safely and easily   From Standing Position, Turn to Look Behind Over each Shoulder Looks behind from both sides and weight shifts well   Turn 360 Degrees Able to turn 360 degrees safely but slowly   Standing Unsupported, Alternately Place Feet on Step/Stool Able to stand independently and safely and complete 8 steps in 20 seconds   Standing Unsupported, One Foot in Front Able to plae foot ahead of the other  independently and hold 30 seconds   Standing on One Leg Tries to lift leg/unable to hold 3 seconds but remains standing independently   Total Score 50   Functional Gait  Assessment   Gait assessed  Yes   Gait Level Surface Walks 20 ft in less than 7 sec but greater than 5.5 sec, uses assistive device, slower speed, mild gait deviations, or deviates 6-10 in outside of the 12 in walkway width.  5.64 sec   Change in Gait Speed Able to change speed, demonstrates mild gait deviations, deviates 6-10 in outside of the 12 in walkway width, or no gait deviations, unable to achieve a major change in velocity, or uses a change in velocity, or uses an assistive device.   Gait with Horizontal Head Turns Performs head turns smoothly with slight change in gait velocity (eg, minor disruption to smooth gait path), deviates 6-10 in outside 12 in walkway width, or uses an assistive device.   Gait with Vertical Head Turns Performs head turns with no change in gait. Deviates no more than 6 in outside 12 in walkway width.   Gait and Pivot Turn Pivot turns safely in greater than 3 sec and stops with no loss of balance, or pivot turns safely within 3 sec and stops with mild imbalance, requires small steps to catch balance.   Step Over Obstacle Is able to step over one shoe box (4.5 in total height) without changing gait speed. No evidence of imbalance.   Gait with Narrow Base of Support Ambulates less than 4 steps heel to toe or cannot perform without assistance.   Gait with Eyes Closed Walks 20 ft, slow speed, abnormal gait pattern, evidence for imbalance, deviates 10-15 in outside 12 in walkway width. Requires more than 9 sec to ambulate 20 ft.  9.92 sec   Ambulating Backwards Walks 20 ft, uses assistive device, slower speed, mild gait deviations, deviates 6-10 in outside 12 in walkway width.   Steps Alternating feet, must use rail.   Total Score 18   FGA comment: Scores < 19 indicate increased fall risk,                      [facility] Adult PT Treatment/Exercise - 11/09/15 0001    Ambulation/Gait   Ambulation/Gait Yes   Ambulation/Gait Assistance 5: Supervision   Ambulation/Gait Assistance Details Cueing for B heel strike during initial contact of respective LE.   Ambulation Distance (Feet) 225 Feet   Assistive device None   Gait Pattern Decreased stance time - right;Decreased arm swing - right;Step-through pattern;Decreased step length - left;Decreased stride length;Decreased hip/knee flexion -  right;Right foot flat;Right flexed knee in stance;Decreased trunk rotation;Decreased weight shift to right;Trunk flexed;Left foot flat;Left flexed knee in stance   Ambulation Surface Level;Indoor   Exercises   Other Exercises  With verbal/demo cueing from PT, pt performed seated B hamsting self-stretch with 6" step 2 x45-sec holds per side. Reviewed standing B gastroc/soleus self-stretch 2 x30-sec holds in each muscle group, bilat; standing B heel/toe raises x15 reps each for increased activation of B ankle dorsiflexors, improved selective control of B ankle DF with concurrent knee ext.                PT Education - 11/09/15 1651    Education provided Yes   Education Details Educated pt on [place] and FGA findings, progress, and implicated fall risk.    HEP: modified to focus on B ankle DF, heel strike during gait.   Person(s) Educated Patient   Methods Explanation;Demonstration;Handout;Verbal cues   Comprehension Verbalized understanding;Returned demonstration          PT Short Term Goals - 11/02/15 1211    PT SHORT TERM GOAL #1   Title Patient demonstrates understanding of initial HEP (Target Date: 10/28/2015)   Baseline MET 11/02/2015   Time 1   Period Months   Status Achieved   PT SHORT TERM GOAL #2   Title Timed Up-Go <13.5 sec to indicate lower fall risk.  (Target Date: 10/28/2015)   Baseline MET 11/02/2015   Time 1   Period Months   Status Achieved   PT SHORT TERM GOAL #3    Title Berg Balance >45/56 to indicate lower fall risk.  (Target Date: 10/28/2015)   Baseline MET 11/02/2015   Time 1   Period Months   Status Achieved   PT SHORT TERM GOAL #4   Title Patient ambulates 300' without device scanning environment & negotiating around obstacles with no balance losses.  (Target Date: 10/28/2015)   Baseline MET 11/02/2015   Time 1   Period Months   Status Achieved           PT Long Term Goals - 11/09/15 1658    PT LONG TERM GOAL #1   Title Patient verbalizes ongoing fitness plan / HEP to progress strength, flexibility, endurance & balance.  (Target Date: 11/26/2015)   Time 2   Period Months   Status On-going   PT LONG TERM GOAL #2   Title Cognitive TUG increases <15% from standard TUG to indicate lower fall risk.  (Target Date: 11/26/2015)   Time 2   Period Months   Status On-going   PT LONG TERM GOAL #3   Title Berg Balance > 52/56 to indicate improved standing balance.  (Target Date: 11/26/2015)   Baseline 3/7: REVISED from > 48 to > 52/56 due to significant pt progress.   Time 2   Period Months   Status Revised   PT LONG TERM GOAL #4   Title Functional Gait Assessment >/= 20/30 to indicate lower fall with gait.  (Target Date: 11/26/2015)   Time 2   Period Months   Status On-going   PT LONG TERM GOAL #5   Title Patient ambulates 500' outside surfaces including grass, ramps, curbs & stairs (1 rail) modified independent for community mobilty.  (Target Date: 11/26/2015)   Time 2   Period Months   Status On-going               Plan - 11/09/15 1700    Clinical Impression Statement Session focused on assessing/addressing standing balance  as well as gait stability in presence of external demands. Since PT evaluation, Berg score has improved from 40/56 to 50/56 and FGA  increased from 12/30 to 18/30. Revised LTG for Berg due to pt progressing more quickly than expected.    Pt will benefit from skilled therapeutic intervention in order to improve  on the following deficits Abnormal gait;Decreased activity tolerance;Decreased balance;Decreased coordination;Decreased knowledge of use of DME;Decreased mobility;Decreased range of motion;Decreased strength;Pain   Rehab Potential Good   PT Frequency 2x / week   PT Duration Other (comment)  9 weeks (60 days)   PT Treatment/Interventions ADLs/Self Care Home Management;DME Instruction;Gait training;Functional mobility training;Therapeutic activities;Stair training;Therapeutic exercise;Balance training;Neuromuscular re-education;Patient/family education;Orthotic Fit/Training   PT Next Visit Plan Continue RLE strengthening, standing balance, and dynamic gait with emphasis on stepping over obstacles, 180-degree turns, tandem and retro gait.   Consulted and Agree with Plan of Care Patient          G-Codes - 11/22/15 1427    Functional Assessment Tool Used Berg Balance 50/56, Functional Gait Assessment 18/30   Functional Limitation Mobility: Walking and moving around   Mobility: Walking and Moving Around Current Status [phone]) At least 40 percent but less than 60 percent impaired, limited or restricted   Mobility: Walking and Moving Around Goal Status [phone]) At least 20 percent but less than 40 percent impaired, limited or restricted      Problem List Patient Active Problem List   Diagnosis Date Noted  . TIA (transient ischemic attack) 09/14/2015  . CKD stage 3 due to type 2 diabetes mellitus ([place]) 09/14/2015  . Acute lower GI bleeding 10/30/2014  . Orthostatic hypotension 10/30/2014  . History of colonic polyps 10/30/2014  . Depression 10/30/2014  . Symptomatic anemia 10/30/2014  . AKI (acute kidney injury) ([place]) 10/30/2014  . Ataxic gait 09/21/2014  . History of CVA (cerebrovascular accident) 02/22/2013  . Abnormal brain scan 02/21/2013  . Dizziness 02/21/2013  . Weakness 02/21/2013  . Diabetes mellitus ([place]) 02/21/2013  . Hypertension 02/21/2013   Physical Therapy Progress  Note  Dates of Reporting Period: 09/28/15 to 11/22/15  Objective Reports of Subjective Statement: See Subjective section above.  Objective Measurements: Berg 50/56; FGA 18/30. See goals and goal statuses for additional objective findings.  Goal Update: Revised/upgraded LTG for Berg score due to pt progress.  Plan: Continue to address LTG's.  Reason Skilled Services are Required: To maximize stability/independence with functional mobility, minimize fall risk, and progress toward PLOF.    Billie Ruddy, PT, DPT [facility] [address] [place], [place], [address] Phone: [phone]   Fax:  [phone] 11-22-15, 5:04 PM  Name: Cody Silva MRN: [id number] Date of Birth: 1946-06-28

## 2015-11-11 ENCOUNTER — Ambulatory Visit: Payer: Medicare Other | Admitting: Physical Therapy

## 2015-11-11 DIAGNOSIS — R531 Weakness: Secondary | ICD-10-CM

## 2015-11-11 DIAGNOSIS — R269 Unspecified abnormalities of gait and mobility: Secondary | ICD-10-CM

## 2015-11-11 DIAGNOSIS — R2681 Unsteadiness on feet: Secondary | ICD-10-CM

## 2015-11-11 NOTE — Therapy (Signed)
[place] [address] [place], [place], [address] Phone: [phone]   Fax:  [phone]  Physical Therapy Treatment  Patient Details  Name: Cody Silva MRN: [id number] Date of Birth: [birth year] Referring Provider: [name], MD  Encounter Date: 11/11/2015      PT End of Session - 11/11/15 2017    Visit Number 11   Number of Visits 18   Date for PT Re-Evaluation 11/26/15   Authorization Type Medicare G-Code & progress noted every 10 visits   PT Start Time 1115  Pt arrived late to session.   PT Stop Time 1147   PT Time Calculation (min) 32 min   Equipment Utilized During Treatment Gait belt   Activity Tolerance Patient tolerated treatment well   Behavior During Therapy WFL for tasks assessed/performed      Past Medical History  Diagnosis Date  . Hypertension   . Diabetic retinopathy ([place])   . Diabetic neuropathy ([place])   . Colon polyps   . History of blood transfusion 10/30/2014    hematochezia  . Anemia   . OSA on CPAP     "suppose to wear mask; I've got a call in for an equipment change" (10/30/2014)  . Type II diabetes mellitus ([place])   . Headache     "@ least 3 times/wk" (10/30/2014)  . Stroke [facility]) 2014    left extremity deficits; facial left  . Arthritis     "right arm, right ankle, right side" (10/30/2014)  . History of gout   . PTSD (post-traumatic stress disorder)     "service related"    Past Surgical History  Procedure Laterality Date  . Cataract extraction w/ intraocular lens  implant, bilateral Bilateral 2014-2015  . Tumor removal Right ~ 1976    "arm; had to take bone left hip to add to the repair"  . Bone graft hip iliac crest Left ~ 1976    There were no vitals filed for this visit.  Visit Diagnosis:  Abnormality of gait  Unsteadiness  Weakness generalized      Subjective Assessment - 11/11/15 1118    Subjective Pt reports having had an injection in R shoulder  since last session. Pt forgot to ask orthopedist for OT referral.   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 Yes   Pain Score 6    Pain Location Scapula   Pain Orientation Right   Pain Descriptors / Indicators Aching   Pain Type Chronic pain   Pain Radiating Towards R shoulder (GH joint)   Pain Onset More than a month ago   Pain Frequency Constant   Aggravating Factors  movement   Pain Relieving Factors rest, tylenol   Effect of Pain on Daily Activities limits ADL's                         [facility] Adult PT Treatment/Exercise - 11/11/15 0001    Posture/Postural Control   Posture/Postural Control Postural limitations   Postural Limitations Rounded Shoulders;Forward head;Increased thoracic kyphosis;Posterior pelvic tilt   Posture Comments Cueing for neutral pelvic tilt to improve thoracic spine posture and shoulder posture/alignment with verbal reinforcement required throughout session.   Neuro Re-ed    Neuro Re-ed Details  The following activities were performed in tall kneeling to increase proximal stability/control, increase motor control in B hip extensors: forward/retro gait 2 x4 steps per direction; lateral stepping 2 x8 steps per direction; squats x10 reps (to pt fatigue). Verbal, tactile, and demo cueing provided with all tall kneeling activities to promote B hip extension throughout. Transitioned from tall kneeling <> R/L half kneeling x5 reps per side (cueing for B stance stability, lateral weight shifting) with min A progressing from single UE support > no UE support. Static R half kneeling 3 x15 seconds for increased distal control in RLE, proximal stability/control in LLE. Static/dynamic quadruped (for increased proprioceptive input, core stability) tolerated x4  minutes prior to increased R shoulder discomfort, at which time activity was ended.             Balance Exercises - 11/11/15 2003    Balance Exercises: Standing   Gait with Head Turns Forward;2 reps;Other (comment)  2 x75'   Retro Gait 2 reps;Other (comment)  2 x75' with cueing for arm swing; maintains COM anterior   Turning Left;3 reps   Other Standing Exercises Turning over cones then replacing cones in upright position x6 reps per LE for improved dynamic single limb stance stability. Min guard-min A required.             PT Short Term Goals - 11/02/15 1211    PT SHORT TERM GOAL #1   Title Patient demonstrates understanding of initial HEP (Target Date: 10/28/2015)   Baseline MET 11/02/2015   Time 1   Period Months   Status Achieved   PT SHORT TERM GOAL #2   Title Timed Up-Go <13.5 sec to indicate lower fall risk.  (Target Date: 10/28/2015)   Baseline MET 11/02/2015   Time 1   Period Months   Status Achieved   PT SHORT TERM GOAL #3   Title Berg Balance >45/56 to indicate lower fall risk.  (Target Date: 10/28/2015)   Baseline MET 11/02/2015   Time 1   Period Months   Status Achieved   PT SHORT TERM GOAL #4   Title Patient ambulates 300' without device scanning environment & negotiating around obstacles with no balance losses.  (Target Date: 10/28/2015)   Baseline MET 11/02/2015   Time 1   Period Months   Status Achieved           PT Long Term Goals - 11/09/15 1658    PT LONG TERM GOAL #1   Title Patient verbalizes ongoing fitness plan / HEP to progress strength, flexibility, endurance & balance.  (Target Date: 11/26/2015)   Time 2   Period Months   Status On-going   PT LONG TERM GOAL #2   Title Cognitive TUG increases <15% from standard TUG to indicate lower fall risk.  (Target Date: 11/26/2015)   Time 2   Period Months   Status On-going   PT LONG TERM GOAL #3   Title Berg Balance > 52/56 to indicate improved standing balance.  (Target Date: 11/26/2015)    Baseline 3/7: REVISED from > 48 to > 52/56 due to significant pt progress.   Time 2   Period Months   Status Revised   PT LONG TERM GOAL #4   Title Functional Gait Assessment >/= 20/30 to indicate lower fall with gait.  (Target Date: 11/26/2015)   Time 2   Period Months   Status On-going   PT LONG TERM GOAL #5   Title Patient ambulates 500' outside surfaces including grass, ramps,  curbs & stairs (1 rail) modified independent for community mobilty.  (Target Date: 11/26/2015)   Time 2   Period Months   Status On-going               Plan - 11/11/15 2019    Clinical Impression Statement Session focused on increasing proximal stability/motor control via dynamic activities in closed chain/WB positions. Noted improved tolerance to tall/half kneeling during this session as compared with previous sessions.    Pt will benefit from skilled therapeutic intervention in order to improve on the following deficits Abnormal gait;Decreased activity tolerance;Decreased balance;Decreased coordination;Decreased knowledge of use of DME;Decreased mobility;Decreased range of motion;Decreased strength;Pain   Rehab Potential Good   PT Frequency 2x / week   PT Duration Other (comment)  9 weeks (60 days)   PT Treatment/Interventions ADLs/Self Care Home Management;DME Instruction;Gait training;Functional mobility training;Therapeutic activities;Stair training;Therapeutic exercise;Balance training;Neuromuscular re-education;Patient/family education;Orthotic Fit/Training   PT Next Visit Plan Continue RLE strengthening, standing balance (dynamic SLS), dynamic gait with emphasis on stepping over obstacles, 180-degree turns, tandem and retro gait.   Consulted and Agree with Plan of Care Patient        Problem List Patient Active Problem List   Diagnosis Date Noted  . TIA (transient ischemic attack) 09/14/2015  . CKD stage 3 due to type 2 diabetes mellitus ([place]) 09/14/2015  . Acute lower GI bleeding  10/30/2014  . Orthostatic hypotension 10/30/2014  . History of colonic polyps 10/30/2014  . Depression 10/30/2014  . Symptomatic anemia 10/30/2014  . AKI (acute kidney injury) ([place]) 10/30/2014  . Ataxic gait 09/21/2014  . History of CVA (cerebrovascular accident) 02/22/2013  . Abnormal brain scan 02/21/2013  . Dizziness 02/21/2013  . Weakness 02/21/2013  . Diabetes mellitus ([place]) 02/21/2013  . Hypertension 02/21/2013    Billie Ruddy, PT, DPT [facility] [address] [place], [place], [address] Phone: [phone]   Fax:  [phone] 11/11/2015, 8:22 PM   Name: SULLY DYMENT MRN: [id number] Date of Birth: Apr 30, 1946

## 2015-11-16 ENCOUNTER — Encounter: Payer: Self-pay | Admitting: Physical Therapy

## 2015-11-16 ENCOUNTER — Ambulatory Visit: Payer: Medicare Other | Admitting: Physical Therapy

## 2015-11-16 DIAGNOSIS — R269 Unspecified abnormalities of gait and mobility: Secondary | ICD-10-CM

## 2015-11-16 DIAGNOSIS — R531 Weakness: Secondary | ICD-10-CM

## 2015-11-16 DIAGNOSIS — R2681 Unsteadiness on feet: Secondary | ICD-10-CM

## 2015-11-16 DIAGNOSIS — R2689 Other abnormalities of gait and mobility: Secondary | ICD-10-CM

## 2015-11-17 NOTE — Therapy (Signed)
[facility] [address] [place], [place], [address] Phone: [phone]   Fax:  [phone]  Physical Therapy Treatment  Patient Details  Name: Cody Silva MRN: [id number] Date of Birth: 02-21-1946 Referring Provider: [name], MD  Encounter Date: 11/16/2015      PT End of Session - 11/16/15 1400    Visit Number 12   Number of Visits 18   Date for PT Re-Evaluation 11/26/15   Authorization Type Medicare G-Code & progress noted every 10 visits   PT Start Time 1120   PT Stop Time 1145   PT Time Calculation (min) 25 min   Equipment Utilized During Treatment Gait belt   Activity Tolerance Patient tolerated treatment well   Behavior During Therapy [facility] [place] [facility] for tasks assessed/performed      Past Medical History  Diagnosis Date  . Hypertension   . Diabetic retinopathy ([place])   . Diabetic neuropathy ([place])   . Colon polyps   . History of blood transfusion 10/30/2014    hematochezia  . Anemia   . OSA on CPAP     "suppose to wear mask; I've got a call in for an equipment change" (10/30/2014)  . Type II diabetes mellitus ([place])   . Headache     "@ least 3 times/wk" (10/30/2014)  . Stroke [facility]) 2014    left extremity deficits; facial left  . Arthritis     "right arm, right ankle, right side" (10/30/2014)  . History of gout   . PTSD (post-traumatic stress disorder)     "service related"    Past Surgical History  Procedure Laterality Date  . Cataract extraction w/ intraocular lens  implant, bilateral Bilateral 2014-2015  . Tumor removal Right ~ 1976    "arm; had to take bone left hip to add to the repair"  . Bone graft hip iliac crest Left ~ 1976    There were no vitals filed for this visit.  Visit Diagnosis:  Abnormality of gait  Unsteadiness  Weakness generalized  Balance problems      Subjective Assessment - 11/16/15 1121    Subjective Shoulder is better than before shot but hurts due to  weather.    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 Yes   Pain Score 5    Pain Location Scapula   Pain Orientation Right   Pain Type Chronic pain   Pain Onset More than a month ago   Pain Frequency Constant   Aggravating Factors  movement   Pain Relieving Factors rest, tylenol     Therapeutic Exercise: Bil. Leg press 130# 15 reps, single leg press with Rt & Lt 75# 15 reps, ankle plantarflexion 130# 15 reps with tactile cues for knee control.  Neuromuscular Re-education in parallel bars with occasional touch & mirror for visual feedback, tactile cues for righting reactions. Standing crossways on foam beam head turns with EO, static with EC, alternate stepping forward and backward. Standing bilateral plantarflexion 15 reps 2 sets.                              PT Short Term Goals - 11/02/15 1211    PT SHORT TERM GOAL #1   Title  Patient demonstrates understanding of initial HEP (Target Date: 10/28/2015)   Baseline MET 11/02/2015   Time 1   Period Months   Status Achieved   PT SHORT TERM GOAL #2   Title Timed Up-Go <13.5 sec to indicate lower fall risk.  (Target Date: 10/28/2015)   Baseline MET 11/02/2015   Time 1   Period Months   Status Achieved   PT SHORT TERM GOAL #3   Title Berg Balance >45/56 to indicate lower fall risk.  (Target Date: 10/28/2015)   Baseline MET 11/02/2015   Time 1   Period Months   Status Achieved   PT SHORT TERM GOAL #4   Title Patient ambulates 300' without device scanning environment & negotiating around obstacles with no balance losses.  (Target Date: 10/28/2015)   Baseline MET 11/02/2015   Time 1   Period Months   Status Achieved           PT Long Term Goals - 11/09/15 1658    PT LONG TERM GOAL #1   Title Patient  verbalizes ongoing fitness plan / HEP to progress strength, flexibility, endurance & balance.  (Target Date: 11/26/2015)   Time 2   Period Months   Status On-going   PT LONG TERM GOAL #2   Title Cognitive TUG increases <15% from standard TUG to indicate lower fall risk.  (Target Date: 11/26/2015)   Time 2   Period Months   Status On-going   PT LONG TERM GOAL #3   Title Berg Balance > 52/56 to indicate improved standing balance.  (Target Date: 11/26/2015)   Baseline 3/7: REVISED from > 48 to > 52/56 due to significant pt progress.   Time 2   Period Months   Status Revised   PT LONG TERM GOAL #4   Title Functional Gait Assessment >/= 20/30 to indicate lower fall with gait.  (Target Date: 11/26/2015)   Time 2   Period Months   Status On-going   PT LONG TERM GOAL #5   Title Patient ambulates 500' outside surfaces including grass, ramps, curbs & stairs (1 rail) modified independent for community mobilty.  (Target Date: 11/26/2015)   Time 2   Period Months   Status On-going               Plan - 11/16/15 1400    Clinical Impression Statement Patient required tactile cues for balance reactions especially with eyes closed for no visual input. Patient is on target to meet LTGs by end of next week.   Pt will benefit from skilled therapeutic intervention in order to improve on the following deficits Abnormal gait;Decreased activity tolerance;Decreased balance;Decreased coordination;Decreased knowledge of use of DME;Decreased mobility;Decreased range of motion;Decreased strength;Pain   Rehab Potential Good   PT Frequency 2x / week   PT Duration Other (comment)  9 weeks (60 days)   PT Treatment/Interventions ADLs/Self Care Home Management;DME Instruction;Gait training;Functional mobility training;Therapeutic activities;Stair training;Therapeutic exercise;Balance training;Neuromuscular re-education;Patient/family education;Orthotic Fit/Training   PT Next Visit Plan Continue RLE strengthening,  standing balance (dynamic SLS), dynamic gait with emphasis on stepping over obstacles, 180-degree turns, tandem and retro gait.   Consulted and Agree with Plan of Care Patient        Problem List Patient Active Problem List   Diagnosis Date Noted  . TIA (transient ischemic attack) 09/14/2015  . CKD stage 3 due to type 2 diabetes mellitus (HCC) 09/14/2015  . Acute lower GI bleeding 10/30/2014  . Orthostatic hypotension 10/30/2014  . History of colonic polyps  10/30/2014  . Depression 10/30/2014  . Symptomatic anemia 10/30/2014  . AKI (acute kidney injury) ([place]) 10/30/2014  . Ataxic gait 09/21/2014  . History of CVA (cerebrovascular accident) 02/22/2013  . Abnormal brain scan 02/21/2013  . Dizziness 02/21/2013  . Weakness 02/21/2013  . Diabetes mellitus ([place]) 02/21/2013  . Hypertension 02/21/2013    Tonatiuh Mallon PT, DPT 11/17/2015, 8:18 AM  [facility] [address] [place], [place], [address] Phone: [phone]   Fax:  [phone]  Name: GRAESON NOURI MRN: [id number] Date of Birth: 1946/01/24

## 2015-11-18 ENCOUNTER — Ambulatory Visit: Payer: Medicare Other | Admitting: Physical Therapy

## 2015-11-18 ENCOUNTER — Encounter: Payer: Self-pay | Admitting: Physical Therapy

## 2015-11-18 ENCOUNTER — Telehealth: Payer: Self-pay | Admitting: Physical Therapy

## 2015-11-18 DIAGNOSIS — W19XXXS Unspecified fall, sequela: Secondary | ICD-10-CM

## 2015-11-18 DIAGNOSIS — R2689 Other abnormalities of gait and mobility: Secondary | ICD-10-CM

## 2015-11-18 DIAGNOSIS — R269 Unspecified abnormalities of gait and mobility: Secondary | ICD-10-CM | POA: Diagnosis not present

## 2015-11-18 DIAGNOSIS — R2681 Unsteadiness on feet: Secondary | ICD-10-CM

## 2015-11-18 DIAGNOSIS — R531 Weakness: Secondary | ICD-10-CM

## 2015-11-18 NOTE — Telephone Encounter (Signed)
Ok will do so 

## 2015-11-18 NOTE — Therapy (Signed)
[facility] [address] [place], [place], [address] Phone: [phone]   Fax:  [phone]  Physical Therapy Treatment  Patient Details  Name: Cody Silva MRN: [id number] Date of Birth: October 17, 1945 Referring Provider: [name], MD  Encounter Date: 11/18/2015      PT End of Session - 11/18/15 1350    Visit Number 13   Number of Visits 18   Date for PT Re-Evaluation 11/26/15   Authorization Type Medicare G-Code & progress noted every 10 visits   PT Start Time 1115   PT Stop Time 1153   PT Time Calculation (min) 38 min   Equipment Utilized During Treatment Gait belt   Activity Tolerance Patient tolerated treatment well   Behavior During Therapy [facility] for tasks assessed/performed      Past Medical History  Diagnosis Date  . Hypertension   . Diabetic retinopathy ([place])   . Diabetic neuropathy ([place])   . Colon polyps   . History of blood transfusion 10/30/2014    hematochezia  . Anemia   . OSA on CPAP     "suppose to wear mask; I've got a call in for an equipment change" (10/30/2014)  . Type II diabetes mellitus ([place])   . Headache     "@ least 3 times/wk" (10/30/2014)  . Stroke [facility]) 2014    left extremity deficits; facial left  . Arthritis     "right arm, right ankle, right side" (10/30/2014)  . History of gout   . PTSD (post-traumatic stress disorder)     "service related"    Past Surgical History  Procedure Laterality Date  . Cataract extraction w/ intraocular lens  implant, bilateral Bilateral 2014-2015  . Tumor removal Right ~ 1976    "arm; had to take bone left hip to add to the repair"  . Bone graft hip iliac crest Left ~ 1976    There were no vitals filed for this visit.  Visit Diagnosis:  Abnormality of gait  Unsteadiness  Weakness generalized  Balance problems  Falls, sequela      Subjective Assessment - 11/18/15 1347    Subjective He reports he has eye appointment at [facility]  this afternoon.    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 Yes   Pain Score 3    Pain Location Scapula   Pain Orientation Right   Pain Descriptors / Indicators Aching   Pain Type Chronic pain   Pain Onset More than a month ago   Pain Frequency Constant   Aggravating Factors  movement   Pain Relieving Factors rest, tylenol   Effect of Pain on Daily Activities limits ADLs     Self Care: see pt education Neuromuscular Re-education: skilled care with tactile & verbal cues on righting reactions and balance / posture Tandem stance with touch every 10-15 seconds, modified single limb stance with one LE in lower cabinet - able to balance 10 seconds without UE support Running hand along wall for support for balance: tandem forward & backward, marching forward & backward, braiding,  Parallel bars with manual / tactile cues & intermittent UE support: standing crossways on foam beam with EO head movements, static EC and alternate stepping on/off beam forward & backward  Gait Training: Patient ambulated  500' working on increasing speed for 25', scanning with tactile cues for maintaining path & pace.                             PT Education - 11/18/15 1145    Education provided Yes   Education Details PT used skeleton to instruct in Nervous System including spinal changes with nerve compression   Person(s) Educated Patient   Methods Explanation;Demonstration;Verbal cues   Comprehension Verbalized understanding          PT Short Term Goals - 11/02/15 1211    PT SHORT TERM GOAL #1   Title Patient demonstrates understanding of initial HEP (Target Date: 10/28/2015)   Baseline MET 11/02/2015   Time 1   Period Months   Status Achieved   PT SHORT TERM GOAL #2    Title Timed Up-Go <13.5 sec to indicate lower fall risk.  (Target Date: 10/28/2015)   Baseline MET 11/02/2015   Time 1   Period Months   Status Achieved   PT SHORT TERM GOAL #3   Title Berg Balance >45/56 to indicate lower fall risk.  (Target Date: 10/28/2015)   Baseline MET 11/02/2015   Time 1   Period Months   Status Achieved   PT SHORT TERM GOAL #4   Title Patient ambulates 300' without device scanning environment & negotiating around obstacles with no balance losses.  (Target Date: 10/28/2015)   Baseline MET 11/02/2015   Time 1   Period Months   Status Achieved           PT Long Term Goals - 11/09/15 1658    PT LONG TERM GOAL #1   Title Patient verbalizes ongoing fitness plan / HEP to progress strength, flexibility, endurance & balance.  (Target Date: 11/26/2015)   Time 2   Period Months   Status On-going   PT LONG TERM GOAL #2   Title Cognitive TUG increases <15% from standard TUG to indicate lower fall risk.  (Target Date: 11/26/2015)   Time 2   Period Months   Status On-going   PT LONG TERM GOAL #3   Title Berg Balance > 52/56 to indicate improved standing balance.  (Target Date: 11/26/2015)   Baseline 3/7: REVISED from > 48 to > 52/56 due to significant pt progress.   Time 2   Period Months   Status Revised   PT LONG TERM GOAL #4   Title Functional Gait Assessment >/= 20/30 to indicate lower fall with gait.  (Target Date: 11/26/2015)   Time 2   Period Months   Status On-going   PT LONG TERM GOAL #5   Title Patient ambulates 500' outside surfaces including grass, ramps, curbs & stairs (1 rail) modified independent for community mobilty.  (Target Date: 11/26/2015)   Time 2   Period Months   Status On-going               Plan - 11/18/15 1351    Clinical Impression Statement Patient required UE support for high level balance activities but improved coordination of movements & reactions with skilled instruction & repetition.    Pt will benefit from skilled  therapeutic intervention in order to improve on the following deficits Abnormal gait;Decreased activity tolerance;Decreased balance;Decreased coordination;Decreased knowledge of use of DME;Decreased mobility;Decreased range of motion;Decreased strength;Pain   Rehab Potential Good   PT Frequency 2x / week   PT Duration Other (comment)  9 weeks (60 days)  PT Treatment/Interventions ADLs/Self Care Home Management;DME Instruction;Gait training;Functional mobility training;Therapeutic activities;Stair training;Therapeutic exercise;Balance training;Neuromuscular re-education;Patient/family education;Orthotic Fit/Training   PT Next Visit Plan assess LTGs over next 2 appointments, gait & balance activities.   Consulted and Agree with Plan of Care Patient        Problem List Patient Active Problem List   Diagnosis Date Noted  . TIA (transient ischemic attack) 09/14/2015  . CKD stage 3 due to type 2 diabetes mellitus ([place]) 09/14/2015  . Acute lower GI bleeding 10/30/2014  . Orthostatic hypotension 10/30/2014  . History of colonic polyps 10/30/2014  . Depression 10/30/2014  . Symptomatic anemia 10/30/2014  . AKI (acute kidney injury) ([place]) 10/30/2014  . Ataxic gait 09/21/2014  . History of CVA (cerebrovascular accident) 02/22/2013  . Abnormal brain scan 02/21/2013  . Dizziness 02/21/2013  . Weakness 02/21/2013  . Diabetes mellitus ([place]) 02/21/2013  . Hypertension 02/21/2013    Jamey Reas PT, DPT 11/18/2015, 1:54 PM  [facility] [address] [place], [place], [address] Phone: [phone]   Fax:  [phone]  Name: DAESHAUN SPECHT MRN: [id number] Date of Birth: 06-Aug-1946

## 2015-11-18 NOTE — Telephone Encounter (Signed)
Cody Silva could benefit from OT evaluation and treatment to improve upper extremity function & strength. Can you please write a referral for Occupational Therapy Evaluation in Epic? Thank you Jamey Reas, PT, DPT

## 2015-11-22 ENCOUNTER — Ambulatory Visit: Payer: Medicare Other | Admitting: Physical Therapy

## 2015-11-25 ENCOUNTER — Ambulatory Visit: Payer: Medicare Other | Admitting: Rehabilitative and Restorative Service Providers"

## 2015-11-25 DIAGNOSIS — R2681 Unsteadiness on feet: Secondary | ICD-10-CM

## 2015-11-25 DIAGNOSIS — R531 Weakness: Secondary | ICD-10-CM

## 2015-11-25 DIAGNOSIS — R269 Unspecified abnormalities of gait and mobility: Secondary | ICD-10-CM | POA: Diagnosis not present

## 2015-11-25 NOTE — Patient Instructions (Signed)
Achilles / Gastroc, Standing    Stand, right foot behind, heel on floor and turned slightly out, leg straight, forward leg bent. Move hips forward. Hold _30__ seconds. Then bend back knee slightly (you should feel a stretch deep in your calf) and hold for additional 30 seconds.  Repeat _3__ times on each leg each day.    HIP: Hamstrings - Short Sitting    Rest leg on raised surface. Keep knee straight. Lift chest and hinge at waist. You should feel a stretch in the back of your leg. Hold _45__ seconds. Perform this stretch 3 times per day on each leg.  Copyright  VHI. All rights reserved.    Standing facing your countertop with both hands on counter for support, raise heels, then rock back on heels and raise toes. Repeat __20__ times. Perform 2 times a day.   SINGLE LIMB STANCE    Stand at counter top and use counter for support. Stance: single leg on floor. Raise leg. Try to hold _10__ seconds. Repeat with other leg. Try to use only one hand and as it gets easier, lessen the amount that you are using the one hand to increase challenge.  _3__ reps per set, _1__ set per day.  CAN START WITH ONE FINGERTIP SUPPORT Copyright  VHI. All rights reserved.   Deep Squat    Stand in front of a stable chair with feet shoulder width apart and squat deeply (as though you're about to sit in the chair) head and chest up. *Make sure your knees aren't moving over your toes. Repeat __15__ times per set. Do _1__ set per day.   Bridging    Slowly raise buttocks from floor, keeping stomach tight. Repeat __12__ times per set. Do __3__ sets per day.   Functional Quadriceps: Sit to Stand    Sit on edge of chair (chose a lower chair to increase difficulty), feet flat on floor. Stand upright, extending knees fully. Do not use your hands!! Repeat __10__ times per set. Do _1___ sets per session. Do _1___ sessions per day.  Abduction: Clam (Eccentric) - Side-Lying    Lie on side  with knees bent. Lift top knee, keeping feet together. Keep trunk steady. Slowly lower for 3-5 seconds. _10__ reps per set, _1__ sets per day, _5__ days per week.  http://ecce.exer.us/64   Copyright  VHI. All rights reserved.

## 2015-11-25 NOTE — Therapy (Signed)
[facility] [address] [place], [place], [address] Phone: [phone]   Fax:  [phone]  Physical Therapy Treatment  Patient Details  Name: Cody Silva MRN: [id number] Date of Birth: September 01, 1946 Referring Provider: [name], MD  Encounter Date: 11/25/2015      PT End of Session - 11/25/15 1256    Visit Number 14   Number of Visits 18   Date for PT Re-Evaluation 11/26/15   Authorization Type Medicare G-Code & progress noted every 10 visits   PT Start Time 1205   PT Stop Time 1250   PT Time Calculation (min) 45 min   Activity Tolerance Patient tolerated treatment well   Behavior During Therapy [facility] for tasks assessed/performed      Past Medical History  Diagnosis Date  . Hypertension   . Diabetic retinopathy ([place])   . Diabetic neuropathy ([place])   . Colon polyps   . History of blood transfusion 10/30/2014    hematochezia  . Anemia   . OSA on CPAP     "suppose to wear mask; I've got a call in for an equipment change" (10/30/2014)  . Type II diabetes mellitus ([place])   . Headache     "@ least 3 times/wk" (10/30/2014)  . Stroke [facility]) 2014    left extremity deficits; facial left  . Arthritis     "right arm, right ankle, right side" (10/30/2014)  . History of gout   . PTSD (post-traumatic stress disorder)     "service related"    Past Surgical History  Procedure Laterality Date  . Cataract extraction w/ intraocular lens  implant, bilateral Bilateral 2014-2015  . Tumor removal Right ~ 1976    "arm; had to take bone left hip to add to the repair"  . Bone graft hip iliac crest Left ~ 1976    There were no vitals filed for this visit.  Visit Diagnosis:  Unsteadiness  Weakness generalized      Subjective Assessment - 11/25/15 1206    Subjective The patient reports exercises are going well.     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 Yes   Pain Score 8    Pain Location Neck   Pain Orientation Right   Pain Descriptors / Indicators Aching   Pain Type Chronic pain   Pain Onset More than a month ago   Pain Frequency Constant   Aggravating Factors  movement   Pain Relieving Factors rest, tylenol            [facility] PT Assessment - 11/25/15 1211    Standardized Balance Assessment   Standardized Balance Assessment Berg Balance Test   Berg Balance Test   Sit to Stand Able to stand without using hands and stabilize independently   Standing Unsupported Able to stand safely 2 minutes   Sitting with Back Unsupported but Feet Supported on Floor or Stool Able to sit safely and securely 2 minutes   Stand to Sit Sits safely with minimal use of hands   Transfers Able to transfer safely, minor use of hands   Standing Unsupported with Eyes Closed Able to stand 10 seconds safely   Standing Ubsupported with Feet Together Able to place feet together independently and stand 1 minute  safely   From Standing, Reach Forward with Outstretched Arm Can reach confidently >25 cm (10")   From Standing Position, Pick up Object from [place] to pick up shoe safely and easily   From Standing Position, Turn to Look Behind Over each Shoulder Looks behind from both sides and weight shifts well   Turn 360 Degrees Able to turn 360 degrees safely in 4 seconds or less   Standing Unsupported, Alternately Place Feet on Step/Stool Able to stand independently and safely and complete 8 steps in 20 seconds   Standing Unsupported, One Foot in Front Able to plae foot ahead of the other independently and hold 30 seconds   Standing on One Leg Tries to lift leg/unable to hold 3 seconds but remains standing independently   Total Score 52   Berg comment: 52/56   Timed Up and Go Test   Normal TUG (seconds) 10.72    Cognitive TUG (seconds) 14.38   Functional Gait  Assessment   Gait assessed  Yes   Gait Level Surface Walks 20 ft in less than 7 sec but greater than 5.5 sec, uses assistive device, slower speed, mild gait deviations, or deviates 6-10 in outside of the 12 in walkway width.   Change in Gait Speed Able to smoothly change walking speed without loss of balance or gait deviation. Deviate no more than 6 in outside of the 12 in walkway width.   Gait with Horizontal Head Turns Performs head turns smoothly with slight change in gait velocity (eg, minor disruption to smooth gait path), deviates 6-10 in outside 12 in walkway width, or uses an assistive device.   Gait with Vertical Head Turns Performs head turns with no change in gait. Deviates no more than 6 in outside 12 in walkway width.   Gait and Pivot Turn Pivot turns safely within 3 sec and stops quickly with no loss of balance.   Step Over Obstacle Is able to step over 2 stacked shoe boxes taped together (9 in total height) without changing gait speed. No evidence of imbalance.   Gait with Narrow Base of Support Ambulates less than 4 steps heel to toe or cannot perform without assistance.   Gait with Eyes Closed Walks 20 ft, uses assistive device, slower speed, mild gait deviations, deviates 6-10 in outside 12 in walkway width. Ambulates 20 ft in less than 9 sec but greater than 7 sec.   Ambulating Backwards Walks 20 ft, slow speed, abnormal gait pattern, evidence for imbalance, deviates 10-15 in outside 12 in walkway width.   Steps Alternating feet, no rail.   Total Score 22   FGA comment: 22/30            [facility] Adult PT Treatment/Exercise - 11/25/15 0001    Ambulation/Gait   Ambulation/Gait Yes   Ambulation/Gait Assistance 6: Modified independent (Device/Increase time)   Ambulation/Gait Assistance Details cueing for bilateral heel strike   Ambulation Distance (Feet) 600 Feet   Assistive device None   Ambulation Surface Level   Gait  Comments Patient able to ambulate over level/unlevel community surfaces modified indep by slowing pace of activity.  He negotiates curbs without UE support and stairs with reciprocal pattern without UE support x 4 steps.   Neuro Re-ed    Neuro Re-ed Details  Reviewed balance HEP including single limb stance activities.   Exercises   Exercises Other Exercises   Other Exercises  Reviewed prior written HEP including heel cord stretch, heel/toe raises, squats, bridges, clamshells, SLRs, sit<>stand.  PT Education - 11-26-2015 1256    Education provided Yes   Education Details Reviewed all HEP and provided new copy   Person(s) Educated Patient   Methods Explanation;Demonstration;Handout   Comprehension Verbalized understanding;Returned demonstration          PT Short Term Goals - 11/02/15 1211    PT SHORT TERM GOAL #1   Title Patient demonstrates understanding of initial HEP (Target Date: 10/28/2015)   Baseline MET 11/02/2015   Time 1   Period Months   Status Achieved   PT SHORT TERM GOAL #2   Title Timed Up-Go <13.5 sec to indicate lower fall risk.  (Target Date: 10/28/2015)   Baseline MET 11/02/2015   Time 1   Period Months   Status Achieved   PT SHORT TERM GOAL #3   Title Berg Balance >45/56 to indicate lower fall risk.  (Target Date: 10/28/2015)   Baseline MET 11/02/2015   Time 1   Period Months   Status Achieved   PT SHORT TERM GOAL #4   Title Patient ambulates 300' without device scanning environment & negotiating around obstacles with no balance losses.  (Target Date: 10/28/2015)   Baseline MET 11/02/2015   Time 1   Period Months   Status Achieved           PT Long Term Goals - 11-26-15 1213    PT LONG TERM GOAL #1   Title Patient verbalizes ongoing fitness plan / HEP to progress strength, flexibility, endurance & balance.  (Target Date: 11/26/2015)   Time 2   Period Months   Status Achieved   PT LONG TERM GOAL #2   Title Cognitive TUG increases  <15% from standard TUG to indicate lower fall risk.  (Target Date: 11/26/2015)   Baseline Patient continues with 25% difference between TUG and TUG cognitive.   Time 2   Period Months   Status Not Met   PT LONG TERM GOAL #3   Title Berg Balance > 52/56 to indicate improved standing balance.  (Target Date: 11/26/2015)   Baseline improved to 52/56   Time 2   Period Months   Status Partially Met   PT LONG TERM GOAL #4   Title Functional Gait Assessment >/= 20/30 to indicate lower fall with gait.  (Target Date: 11/26/2015)   Baseline 22/30 on FGA   Time 2   Period Months   Status Achieved   PT LONG TERM GOAL #5   Title Patient ambulates 500' outside surfaces including grass, ramps, curbs & stairs (1 rail) modified independent for community mobilty.  (Target Date: 11/26/2015)   Baseline met on 2015/11/26   Time 2   Period Months   Status Achieved               Plan - 11-26-15 1258    Clinical Impression Statement The patient met 4/5 LTGs.  He needed cues to complete written HEP and is not performiing regularly at home.  Patient is interested in pursuing a program at [facility], but has not yet set up clear plan for post d/c activities.   PT Next Visit Plan d/c today.   Consulted and Agree with Plan of Care Patient          G-Codes - 11/26/15 1303    Functional Assessment Tool Used Berg=52/56, and FGA=22/30   Functional Limitation Mobility: Walking and moving around   Mobility: Walking and Moving Around Goal Status [phone]) At least 20 percent but less than 40 percent impaired, limited or restricted  Mobility: Walking and Moving Around Discharge Status [phone]) At least 1 percent but less than 20 percent impaired, limited or restricted      Problem List Patient Active Problem List   Diagnosis Date Noted  . TIA (transient ischemic attack) 09/14/2015  . CKD stage 3 due to type 2 diabetes mellitus ([place]) 09/14/2015  . Acute lower GI bleeding 10/30/2014  . Orthostatic hypotension  10/30/2014  . History of colonic polyps 10/30/2014  . Depression 10/30/2014  . Symptomatic anemia 10/30/2014  . AKI (acute kidney injury) ([place]) 10/30/2014  . Ataxic gait 09/21/2014  . History of CVA (cerebrovascular accident) 02/22/2013  . Abnormal brain scan 02/21/2013  . Dizziness 02/21/2013  . Weakness 02/21/2013  . Diabetes mellitus ([place]) 02/21/2013  . Hypertension 02/21/2013    Telford Archambeau, PT 11/25/2015, 1:03 PM  [facility] [address] [place], [place], [address] Phone: [phone]   Fax:  [phone]  Name: Cody Silva MRN: [id number] Date of Birth: [birth year]

## 2015-12-15 ENCOUNTER — Ambulatory Visit (INDEPENDENT_AMBULATORY_CARE_PROVIDER_SITE_OTHER): Payer: Medicare Other | Admitting: Neurology

## 2015-12-15 ENCOUNTER — Encounter: Payer: Self-pay | Admitting: Neurology

## 2015-12-15 VITALS — BP 148/82 | HR 72 | Ht 76.5 in | Wt 223.1 lb

## 2015-12-15 DIAGNOSIS — M6289 Other specified disorders of muscle: Secondary | ICD-10-CM | POA: Diagnosis not present

## 2015-12-15 DIAGNOSIS — R29898 Other symptoms and signs involving the musculoskeletal system: Secondary | ICD-10-CM

## 2015-12-15 DIAGNOSIS — G459 Transient cerebral ischemic attack, unspecified: Secondary | ICD-10-CM

## 2015-12-15 NOTE — Progress Notes (Signed)
PATIENT: Cody Silva DOB: [birth year]  REASON FOR VISIT: routine follow up for stroke HISTORY FROM: patient  HISTORY OF PRESENT ILLNESS: 70 year male seen for first office f/u visit after [name] admission on 02/20/2013.he presented with 3 month h/o intermittent slurred speech, leg weakness and 1 episode of syncope.he was evaluated at the [name] clinic and had several head CTs which were unremarkable.he had 2 episodes of falling on day of admission and MRI brain showed a large 4 x 4 cm diffusion positive lesion in corpus callosum involving right more than left cingulate guyrus with only faint enhancement. It was unclear wether this was a callosal branch anterior cerebral artery infarct or a mass lesion.MRA showed decrease flow in median artery of corpus callosum.Carotid dopplers and 2DEcho showed normal ejection fraction. EEG was normal. Lipid profile was normal. HbA1c was elevated at 7.5% He is tolerating plavix well without bleeding or side effects.He states his gait and balance have improved and he is walking well. He notices some imbalance when he is in a hurry or tired but is able to catch himself without falling. He has sleep apnea but is not using his CPAP mask daily.  UPDATE 09/12/13 (LL): Cody Silva returns to office for stroke revisit. He states he has been doing well, having some headaches but not too often. He complains that his memory is not as well as before the stroke. His wife does not notice any problem with his memory. His follow up MRI showed expected evolutionary changes, no mention of mass lesion. He is tolerating Plavix well but has stopped currently to have a tooth extracted early next week. His BP is elevated in office today, at 166/92, but he says it is usually lower.   UPDATE 03/12/14 (LL): Since last visit, he has not had any new neurovascular symptoms. He has started to have more difficulties with imbalance. 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 His  blood pressure is elevated in the office today, 188/90.  He states it is not that high when he takes it at home. He is having progressively more acting out dreams, but his wife states that he does not get out of bed.  He has PTSD from being in the [place] War, and has had treatment through the [place]. He does not wear his cpap consistently. His blood sugars in the morning range from 130-170, but he has had some problems with lows as well.  He is tolerating Plavix well with no signs of significant bleeding or bruising. Update 09/21/2014 : He returns for follow-up after last visit 6 months ago. She continues to have poor balance. He did go for outpatient physical therapy following the last visit but it did not seem to help a lot. The patient's wife states that he had a transient episode of facial droop as well as increased imbalance about a month ago but they did not seek medical help at that time. The patient continues not to use his CPAP regularly but he does plan to go to the [place] and get his recalibrated so it fits better. He remains on Plavix which is tolerating well but plans to get an elective dental procedure and wants to stop it for 3 days prior to the procedure. He states his fasting sugars have been quite good and last hemoglobin A1c was 7.0  3 months ago. His blood pressure has been well controlled. His last lipid profile was also fine. He has not  had a carotid Doppler done since he left the hospital a year and a half ago.  Update 03/22/2015 : He returns for follow-up after last visit 6 months ago. He was admitted in February 2016 twice to the hospital initially with episodes of GI bleed and subsequently with anemia and generalized weakness. He see blood transfusion. He'll stop Plavix for 7 days but GI workup did not reveal a source of bleeding. Plavix has been restarted and is tolerating it well. He however does still complain of generalized weakness and at times feels he is weak and cannot get out of a chair  and has trouble climbing steps. He has not had any recent lab work done but he he plans to see his primary care physician at the [place] but is not happy with the care and plans for Transfer to a new physician in [place]. He still continues to have occasional speech difficulties and continues to have mild balance difficulties though he is not had any falls. Update 12/15/2015 : He returns for follow-up after last visit 9 months ago. He was hospitalized briefly in January 2017 with transient episode of slurred speech, facial droop and blurred vision. MRI scan of the brain personally reviewed did not reveal an acute infarct. MRA brain showed no large vessel stenosis or occlusion. Carotid ultrasound was unremarkable. Transthoracic echo showed normal ejection fraction. Hemoglobin A1c was elevated at 7.9 and LDL cholesterol 137. Patient was started on Plavix and continued on his blood pressure and cholesterol medications. He states is tolerating all medications well without any side effects. His blood pressure is not doing well and  today is elevated at 148/82. He has recently changed his primary care physician and plans to work with him towards getting better controlled. His fasting sugars have been all okay but his last hemoglobin A1c in January was elevated at 7.9. Patient states he has significant difficulty with walking particularly steps he feels off balance. When he bends down suddenly or turns his neck he feels off balance. His noticed weakness in his hand muscles as well as wasting and often drops objects from his hands. and he has seen in spine surgeon at [facility] and plans treatment of her neck surgery but wants to wait till June to have this done. He also plans on having some tooth removed and isn't willing to wait till June as well. At times he has had the malls while climbing steps. 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 He wants to be referred for  outpatient occupational therapy to work on his hand strength. REVIEW OF SYSTEMS: Full 14 system review of systems performed and notable only for:   14 system review of systems is positive for   walking difficulty, headache, weakness, agitation,   confusion and all other systems negative   ALLERGIES: Allergies  Allergen Reactions  . Metformin And Related Diarrhea    HOME MEDICATIONS: Outpatient Prescriptions Prior to Visit  Medication Sig Dispense Refill  . acetaminophen (TYLENOL) 500 MG tablet Take 1 tablet (500 mg total) by mouth every 6 (six) hours as needed for mild pain, moderate pain, fever or headache. 30 tablet 0  . albuterol (PROVENTIL HFA;VENTOLIN HFA) 108 (90 BASE) MCG/ACT inhaler Inhale 2 puffs into the lungs every 6 (six) hours as needed for wheezing.    . carvedilol (COREG) 25 MG tablet Take 25 mg by mouth 2 (two) times daily with a meal. Reported  on 09/14/2015    . citalopram (CELEXA) 20 MG tablet Take 20 mg by mouth daily.    . cloNIDine (CATAPRES - DOSED IN MG/24 HR) 0.3 mg/24hr Place 1 patch onto the skin once a week. No specific day    . clopidogrel (PLAVIX) 75 MG tablet Take 1 tablet (75 mg total) by mouth at bedtime.    . fluocinonide cream (LIDEX) [id number] % Apply 1 application topically 2 (two) times daily.    . fluticasone (FLONASE) 50 MCG/ACT nasal spray Place 2 sprays into the nose daily as needed for allergies.     [name] gabapentin (NEURONTIN) 300 MG capsule Take 300 mg by mouth daily as needed (leg pain).     . insulin aspart (NOVOLOG) 100 UNIT/ML injection Inject 5-15 Units into the skin 3 (three) times daily with meals. Per sliding scale    . insulin glargine (LANTUS) 100 UNIT/ML injection Inject 30-49 Units into the skin at bedtime.     [name] latanoprost (XALATAN) 0.005 % ophthalmic solution Place 1 drop into both eyes at bedtime.     [name] losartan (COZAAR) 100 MG tablet Take 100 mg by mouth daily.    . Multiple Vitamins-Minerals (MULTIVITAMIN PO) Take 1 tablet by mouth  daily.    . niacin 100 MG tablet Take 1 tablet (100 mg total) by mouth at bedtime. 30 tablet 0  . polyvinyl alcohol (LIQUIFILM TEARS) 1.4 % ophthalmic solution Place 1 drop into both eyes 5 (five) times daily as needed (for dry eyes).    . simvastatin (ZOCOR) 40 MG tablet Take 40 mg by mouth every evening.    [name] spironolactone (ALDACTONE) 25 MG tablet Take 25 mg by mouth daily.    . Skin Protectants, Misc. (EUCERIN) cream Apply 1 application topically daily.      No facility-administered medications prior to visit.    PHYSICAL EXAM Filed Vitals:   12/15/15 1032  BP: 148/82  Pulse: 72  Height: 6' 4.5" (1.943 m)  Weight: 223 lb 1.6 oz (101.197 kg)   Body mass index is 26.81 kg/(m^2). No exam data present No flowsheet data found.  No flowsheet data found.   General: well developed, well nourished, african [place] male seated, in no evident distress  Head: head normocephalic and atraumatic.   benign  Neck: supple with no carotid or supraclavicular bruits  Cardiovascular: regular rate and rhythm, no murmurs  Musculoskeletal: no deformity  Skin: no rash/petichiae  Vascular: Normal pulses all extremities   Neurologic Exam  Mental Status: Awake and fully alert. Oriented to place and time.  Attention span, concentration and fund of knowledge appropriate. Mood and affect appropriate. Mini-Mental status exam scored 28/30 with deficits in attention and calculation only. Animal naming test 12. Cranial Nerves: Pupils equal, briskly reactive to light. Extraocular movements full without nystagmus. Visual fields full to confrontation. Hearing intact. Facial sensation intact. Face, tongue, palate moves normally and symmetrically.  Motor: Normal bulk and tone. Normal strength in all tested extremity muscles.diminished fine finger movements on left and orbits right over left upper extremity.  Sensory.: intact to tough and pinprick and vibratory.  Coordination: Rapid alternating movements normal in  all extremities. Finger-to-nose and heel-to-shin performed accurately bilaterally.  Gait and Station: Arises from chair without difficulty. Stance is normal. Gait demonstrates normal stride length, with some stiffness of the right leg.  Poor arm swing on the left. Unable to heel, toe walk without difficulty. Tandem unsteady. Reflexes: 1+ and symmetric. Toes downgoing.   07/17/13 Mri brain without  Abnormal MRI scan the brain showing multiple nonspecific periventricular and subcortical nonspecific white matter hyperintensities with a differential discussed above. No enhancing lesions are noted. Compared with MRI scan dated 02/20/2013 the previously described cortical acute infarct shows expected evolutionary changes.   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 Recent admission in January 2017 due to a TIA. Vascular risk factors of HTN, Sleep Apnea and Diabetes.  [name]   PLAN:   I had a long d/w patient about his recent TIA, risk for recurrent stroke/TIAs, personally independently reviewed imaging studies and stroke evaluation results and answered questions.Continue Plavix  for secondary stroke prevention and maintain strict control of hypertension with blood pressure goal below 130/90, diabetes with hemoglobin A1c goal below 6.5% and lipids with LDL cholesterol goal below 70 mg/dL. I also advised the patient to eat a healthy diet with plenty of whole grains, cereals, fruits and vegetables, exercise regularly and maintain ideal body weight .patient plans to have elective cervical spine and dental surgical procedures in June I recommend he hold Plavix for 5 days prior to the procedure and resume it when safe after it with a small but acceptable. periprocedure  risk of TIA/stroke if it is okay with him. We will also order outpatient occupational therapy to improve his hand fine motor skills. The  patient is physically disabled from his prior stroke and now with cervical spine disease is unlikely to be able to return to his previous job in my opinion Followup in the future with stroke nurse practitioner in 6 months or call earlier if necessary.  Antony Contras, MD  Orders Placed This Encounter  Procedures  . Ambulatory referral to Occupational Therapy   Return in about 6 months (around 06/15/2016).  Antony Contras, MD  12/15/2015, 11:35 AM [facility] [address] [place], [place] [address] [phone]  Note: This document was prepared with digital dictation and possible smart phrase technology. Any transcriptional errors that result from this process are unintentional.

## 2015-12-15 NOTE — Patient Instructions (Addendum)
I had a long d/w patient about his recent TIA, risk for recurrent stroke/TIAs, personally independently reviewed imaging studies and stroke evaluation results and answered questions.Continue Plavix  for secondary stroke prevention and maintain strict control of hypertension with blood pressure goal below 130/90, diabetes with hemoglobin A1c goal below 6.5% and lipids with LDL cholesterol goal below 70 mg/dL. I also advised the patient to eat a healthy diet with plenty of whole grains, cereals, fruits and vegetables, exercise regularly and maintain ideal body weight .patient plans to have elective cervical spine and dental surgical procedures in June I recommend he hold Plavix for 5 days prior to the procedure and resume it when safe after it with a small but acceptable. periprocedure  risk of TIA/stroke if it is okay with him. We will also order outpatient occupational therapy to improve his hand fine motor skills. Followup in the future with strokepractitioner in 6 months or call earlier if necessary. Stroke Prevention Some medical conditions and behaviors are associated with an increased chance of having a stroke. You may prevent a stroke by making healthy choices and managing medical conditions. HOW CAN I REDUCE MY RISK OF HAVING A STROKE?   Stay physically active. Get at least 30 minutes of activity on most or all days.  Do not smoke. It may also be helpful to avoid exposure to secondhand smoke.  Limit alcohol use. Moderate alcohol use is considered to be:  No more than 2 drinks per day for men.  No more than 1 drink per day for nonpregnant women.  Eat healthy foods. This involves:  Eating 5 or more servings of fruits and vegetables a day.  Making dietary changes that address high blood pressure (hypertension), high cholesterol, diabetes, or obesity.  Manage your cholesterol levels.  Making food choices that are high in fiber and low in saturated fat, trans fat, and cholesterol may control  cholesterol levels.  Take any prescribed medicines to control cholesterol as directed by your health care provider.  Manage your diabetes.  Controlling your carbohydrate and sugar intake is recommended to manage diabetes.  Take any prescribed medicines to control diabetes as directed by your health care provider.  Control your hypertension.  Making food choices that are low in salt (sodium), saturated fat, trans fat, and cholesterol is recommended to manage hypertension.  Ask your health care provider if you need treatment to lower your blood pressure. Take any prescribed medicines to control hypertension as directed by your health care provider.  If you are 68-35 years of age, have your blood pressure checked every 3-5 years. If you are 6 years of age or older, have your blood pressure checked every year.  Maintain a healthy weight.  Reducing calorie intake and making food choices that are low in sodium, saturated fat, trans fat, and cholesterol are recommended to manage weight.  Stop drug abuse.  Avoid taking birth control pills.  Talk to your health care provider about the risks of taking birth control pills if you are over 25 years old, smoke, get migraines, or have ever had a blood clot.  Get evaluated for sleep disorders (sleep apnea).  Talk to your health care provider about getting a sleep evaluation if you snore a lot or have excessive sleepiness.  Take medicines only as directed by your health care provider.  For some people, aspirin or blood thinners (anticoagulants) are helpful in reducing the risk of forming abnormal blood clots that can lead to stroke. If you have the irregular  heart rhythm of atrial fibrillation, you should be on a blood thinner unless there is a good reason you cannot take them.  Understand all your medicine instructions.  Make sure that other conditions (such as anemia or atherosclerosis) are addressed. SEEK IMMEDIATE MEDICAL CARE IF:   You  have sudden weakness or numbness of the face, arm, or leg, especially on one side of the body.  Your face or eyelid droops to one side.  You have sudden confusion.  You have trouble speaking (aphasia) or understanding.  You have sudden trouble seeing in one or both eyes.  You have sudden trouble walking.  You have dizziness.  You have a loss of balance or coordination.  You have a sudden, severe headache with no known cause.  You have new chest pain or an irregular heartbeat. Any of these symptoms may represent a serious problem that is an emergency. Do not wait to see if the symptoms will go away. Get medical help at once. Call your local emergency services (911 in [place] Do not drive yourself to the hospital.   This information is not intended to replace advice given to you by your health care provider. Make sure you discuss any questions you have with your health care provider.   Document Released: 09/28/2004 Document Revised: 09/11/2014 Document Reviewed: 02/21/2013 Elsevier Interactive Patient Education [employer].

## 2015-12-16 ENCOUNTER — Telehealth: Payer: Self-pay

## 2015-12-16 DIAGNOSIS — Z0289 Encounter for other administrative examinations: Secondary | ICD-10-CM

## 2015-12-16 NOTE — Telephone Encounter (Signed)
LFt vm for patient back the two forms he turn in for [place] benefits. Rn explain that his PCP would have to filled out the diabetes mellitus disability form, but Dr. Leonie Man can filled out the other form for his stroke. Rn requested patient call back.

## 2015-12-17 NOTE — Telephone Encounter (Signed)
LFt vm for patient about his forms for the [place].

## 2015-12-22 ENCOUNTER — Emergency Department (HOSPITAL_COMMUNITY): Payer: Medicare Other

## 2015-12-22 ENCOUNTER — Encounter (HOSPITAL_COMMUNITY): Payer: Self-pay

## 2015-12-22 ENCOUNTER — Observation Stay (HOSPITAL_COMMUNITY)
Admission: EM | Admit: 2015-12-22 | Discharge: 2015-12-24 | Disposition: A | Discharge status: Home / Self Care | Payer: Medicare Other | Attending: Internal Medicine | Admitting: Internal Medicine

## 2015-12-22 DIAGNOSIS — Z77098 Contact with and (suspected) exposure to other hazardous, chiefly nonmedicinal, chemicals: Secondary | ICD-10-CM | POA: Insufficient documentation

## 2015-12-22 DIAGNOSIS — R9431 Abnormal electrocardiogram [ECG] [EKG]: Secondary | ICD-10-CM | POA: Diagnosis not present

## 2015-12-22 DIAGNOSIS — E1140 Type 2 diabetes mellitus with diabetic neuropathy, unspecified: Secondary | ICD-10-CM | POA: Diagnosis not present

## 2015-12-22 DIAGNOSIS — Z8673 Personal history of transient ischemic attack (TIA), and cerebral infarction without residual deficits: Secondary | ICD-10-CM

## 2015-12-22 DIAGNOSIS — F329 Major depressive disorder, single episode, unspecified: Secondary | ICD-10-CM | POA: Insufficient documentation

## 2015-12-22 DIAGNOSIS — I129 Hypertensive chronic kidney disease with stage 1 through stage 4 chronic kidney disease, or unspecified chronic kidney disease: Secondary | ICD-10-CM | POA: Diagnosis not present

## 2015-12-22 DIAGNOSIS — Z79899 Other long term (current) drug therapy: Secondary | ICD-10-CM | POA: Diagnosis not present

## 2015-12-22 DIAGNOSIS — E785 Hyperlipidemia, unspecified: Secondary | ICD-10-CM | POA: Insufficient documentation

## 2015-12-22 DIAGNOSIS — I10 Essential (primary) hypertension: Secondary | ICD-10-CM | POA: Diagnosis present

## 2015-12-22 DIAGNOSIS — R55 Syncope and collapse: Secondary | ICD-10-CM | POA: Diagnosis not present

## 2015-12-22 DIAGNOSIS — E876 Hypokalemia: Secondary | ICD-10-CM | POA: Diagnosis not present

## 2015-12-22 DIAGNOSIS — M109 Gout, unspecified: Secondary | ICD-10-CM | POA: Insufficient documentation

## 2015-12-22 DIAGNOSIS — E1121 Type 2 diabetes mellitus with diabetic nephropathy: Secondary | ICD-10-CM | POA: Diagnosis not present

## 2015-12-22 DIAGNOSIS — G4733 Obstructive sleep apnea (adult) (pediatric): Secondary | ICD-10-CM | POA: Diagnosis not present

## 2015-12-22 DIAGNOSIS — N183 Chronic kidney disease, stage 3 unspecified: Secondary | ICD-10-CM | POA: Diagnosis present

## 2015-12-22 DIAGNOSIS — E11319 Type 2 diabetes mellitus with unspecified diabetic retinopathy without macular edema: Secondary | ICD-10-CM | POA: Diagnosis not present

## 2015-12-22 DIAGNOSIS — E1122 Type 2 diabetes mellitus with diabetic chronic kidney disease: Secondary | ICD-10-CM | POA: Diagnosis present

## 2015-12-22 DIAGNOSIS — G459 Transient cerebral ischemic attack, unspecified: Secondary | ICD-10-CM | POA: Diagnosis not present

## 2015-12-22 DIAGNOSIS — I69931 Monoplegia of upper limb following unspecified cerebrovascular disease affecting right dominant side: Secondary | ICD-10-CM | POA: Insufficient documentation

## 2015-12-22 DIAGNOSIS — E1159 Type 2 diabetes mellitus with other circulatory complications: Secondary | ICD-10-CM | POA: Diagnosis present

## 2015-12-22 DIAGNOSIS — R079 Chest pain, unspecified: Secondary | ICD-10-CM | POA: Insufficient documentation

## 2015-12-22 DIAGNOSIS — Z794 Long term (current) use of insulin: Secondary | ICD-10-CM | POA: Diagnosis not present

## 2015-12-22 DIAGNOSIS — I152 Hypertension secondary to endocrine disorders: Secondary | ICD-10-CM | POA: Diagnosis present

## 2015-12-22 DIAGNOSIS — Z7739 Contact with and (suspected) exposure to other war theater: Secondary | ICD-10-CM

## 2015-12-22 DIAGNOSIS — F4310 Post-traumatic stress disorder, unspecified: Secondary | ICD-10-CM | POA: Insufficient documentation

## 2015-12-22 LAB — CBC WITH DIFFERENTIAL/PLATELET
BASOS ABS: 0 10*3/uL (ref 0.0–0.1)
BASOS PCT: 0 %
Eosinophils Absolute: 0 10*3/uL (ref 0.0–0.7)
Eosinophils Relative: 1 %
HEMATOCRIT: 35.3 % — AB (ref 39.0–52.0)
HEMOGLOBIN: 12.4 g/dL — AB (ref 13.0–17.0)
LYMPHS PCT: 20 %
Lymphs Abs: 0.8 10*3/uL (ref 0.7–4.0)
MCH: 30 pg (ref 26.0–34.0)
MCHC: 35.1 g/dL (ref 30.0–36.0)
MCV: 85.5 fL (ref 78.0–100.0)
MONOS PCT: 5 %
Monocytes Absolute: 0.2 10*3/uL (ref 0.1–1.0)
NEUTROS ABS: 3 10*3/uL (ref 1.7–7.7)
NEUTROS PCT: 74 %
Platelets: 159 10*3/uL (ref 150–400)
RBC: 4.13 MIL/uL — ABNORMAL LOW (ref 4.22–5.81)
RDW: 13.4 % (ref 11.5–15.5)
WBC: 4.1 10*3/uL (ref 4.0–10.5)

## 2015-12-22 LAB — COMPREHENSIVE METABOLIC PANEL
ALBUMIN: 3.7 g/dL (ref 3.5–5.0)
ALK PHOS: 63 U/L (ref 38–126)
ALT: 22 U/L (ref 17–63)
AST: 27 U/L (ref 15–41)
Anion gap: 6 (ref 5–15)
BILIRUBIN TOTAL: 0.6 mg/dL (ref 0.3–1.2)
BUN: 19 mg/dL (ref 6–20)
CALCIUM: 9.3 mg/dL (ref 8.9–10.3)
CO2: 30 mmol/L (ref 22–32)
Chloride: 106 mmol/L (ref 101–111)
Creatinine, Ser: 1.63 mg/dL — ABNORMAL HIGH (ref 0.61–1.24)
GFR calc Af Amer: 48 mL/min — ABNORMAL LOW (ref >60)
GFR calc non Af Amer: 42 mL/min — ABNORMAL LOW (ref >60)
GLUCOSE: 139 mg/dL — AB (ref 65–99)
Potassium: 3.3 mmol/L — ABNORMAL LOW (ref 3.5–5.1)
Sodium: 142 mmol/L (ref 135–145)
TOTAL PROTEIN: 7.2 g/dL (ref 6.5–8.1)

## 2015-12-22 LAB — GLUCOSE, CAPILLARY
GLUCOSE-CAPILLARY: 117 mg/dL — AB (ref 65–99)
Glucose-Capillary: 163 mg/dL — ABNORMAL HIGH (ref 65–99)

## 2015-12-22 LAB — TROPONIN I
TROPONIN I: 0.04 ng/mL — AB (ref <0.031)
Troponin I: 0.04 ng/mL — ABNORMAL HIGH (ref <0.031)
Troponin I: 0.04 ng/mL — ABNORMAL HIGH (ref <0.031)

## 2015-12-22 LAB — CBG MONITORING, ED: Glucose-Capillary: 156 mg/dL — ABNORMAL HIGH (ref 65–99)

## 2015-12-22 LAB — TSH: TSH: 0.739 u[IU]/mL (ref 0.350–4.500)

## 2015-12-22 MED ORDER — ONDANSETRON HCL 4 MG PO TABS: 4.0000 mg | ORAL_TABLET | Freq: Four times a day (QID) | ORAL | Status: DC | PRN

## 2015-12-22 MED ORDER — ACETAMINOPHEN 500 MG PO TABS
500.0000 mg | ORAL_TABLET | Freq: Four times a day (QID) | ORAL | Status: DC | PRN
  Administered 2015-12-22 – 2015-12-23 (×3): 500 mg via ORAL
  Filled 2015-12-22 (×3): qty 1

## 2015-12-22 MED ORDER — OXYCODONE HCL 5 MG PO TABS: 5.0000 mg | ORAL_TABLET | ORAL | Status: DC | PRN

## 2015-12-22 MED ORDER — ADULT MULTIVITAMIN W/MINERALS CH
1.0000 | ORAL_TABLET | Freq: Every day | ORAL | Status: DC
  Administered 2015-12-22 – 2015-12-24 (×3): 1 via ORAL
  Filled 2015-12-22 (×3): qty 1

## 2015-12-22 MED ORDER — INSULIN ASPART 100 UNIT/ML ~~LOC~~ SOLN
0.0000 [IU] | Freq: Three times a day (TID) | SUBCUTANEOUS | Status: DC
  Administered 2015-12-23: 2 [IU] via SUBCUTANEOUS
  Administered 2015-12-23: 1 [IU] via SUBCUTANEOUS
  Administered 2015-12-24: 3 [IU] via SUBCUTANEOUS

## 2015-12-22 MED ORDER — INSULIN GLARGINE 100 UNIT/ML ~~LOC~~ SOLN
30.0000 [IU] | Freq: Every day | SUBCUTANEOUS | Status: DC
  Administered 2015-12-22 – 2015-12-23 (×2): 30 [IU] via SUBCUTANEOUS
  Filled 2015-12-22 (×3): qty 0.3

## 2015-12-22 MED ORDER — MAGNESIUM SULFATE 2 GM/50ML IV SOLN
2.0000 g | Freq: Once | INTRAVENOUS | Status: AC
  Administered 2015-12-22: 2 g via INTRAVENOUS
  Filled 2015-12-22: qty 50

## 2015-12-22 MED ORDER — POLYVINYL ALCOHOL 1.4 % OP SOLN
1.0000 [drp] | Freq: Every day | OPHTHALMIC | Status: DC | PRN
Start: 2015-12-22 — End: 2015-12-24

## 2015-12-22 MED ORDER — POTASSIUM CHLORIDE CRYS ER 20 MEQ PO TBCR
40.0000 meq | EXTENDED_RELEASE_TABLET | Freq: Once | ORAL | Status: AC
  Administered 2015-12-22: 40 meq via ORAL
  Filled 2015-12-22: qty 2

## 2015-12-22 MED ORDER — LORATADINE 10 MG PO TABS: 10.0000 mg | ORAL_TABLET | Freq: Every day | ORAL | Status: DC | PRN

## 2015-12-22 MED ORDER — CITALOPRAM HYDROBROMIDE 20 MG PO TABS
20.0000 mg | ORAL_TABLET | Freq: Every day | ORAL | Status: DC
  Administered 2015-12-22 – 2015-12-24 (×3): 20 mg via ORAL
  Filled 2015-12-22 (×3): qty 1

## 2015-12-22 MED ORDER — ALBUTEROL SULFATE HFA 108 (90 BASE) MCG/ACT IN AERS: 2.0000 | INHALATION_SPRAY | Freq: Four times a day (QID) | RESPIRATORY_TRACT | Status: DC | PRN

## 2015-12-22 MED ORDER — POTASSIUM CHLORIDE CRYS ER 20 MEQ PO TBCR: 30.0000 meq | EXTENDED_RELEASE_TABLET | Freq: Once | ORAL | Status: DC

## 2015-12-22 MED ORDER — LOSARTAN POTASSIUM 50 MG PO TABS: 100.0000 mg | ORAL_TABLET | Freq: Every day | ORAL | Status: DC

## 2015-12-22 MED ORDER — CLONIDINE HCL 0.3 MG/24HR TD PTWK: 0.3000 mg | MEDICATED_PATCH | TRANSDERMAL | Status: DC

## 2015-12-22 MED ORDER — INSULIN ASPART 100 UNIT/ML ~~LOC~~ SOLN: 0.0000 [IU] | Freq: Every day | SUBCUTANEOUS | Status: DC

## 2015-12-22 MED ORDER — SIMVASTATIN 40 MG PO TABS
40.0000 mg | ORAL_TABLET | Freq: Every evening | ORAL | Status: DC
  Administered 2015-12-22 – 2015-12-23 (×2): 40 mg via ORAL
  Filled 2015-12-22 (×2): qty 1

## 2015-12-22 MED ORDER — LATANOPROST 0.005 % OP SOLN
1.0000 [drp] | Freq: Every day | OPHTHALMIC | Status: DC
  Administered 2015-12-22 – 2015-12-23 (×2): 1 [drp] via OPHTHALMIC
  Filled 2015-12-22: qty 2.5

## 2015-12-22 MED ORDER — SODIUM CHLORIDE 0.9% FLUSH
3.0000 mL | Freq: Two times a day (BID) | INTRAVENOUS | Status: DC
  Administered 2015-12-22 – 2015-12-23 (×3): 3 mL via INTRAVENOUS

## 2015-12-22 MED ORDER — HYDRALAZINE HCL 10 MG PO TABS
10.0000 mg | ORAL_TABLET | Freq: Three times a day (TID) | ORAL | Status: DC | PRN
  Administered 2015-12-23: 10 mg via ORAL
  Filled 2015-12-22: qty 1

## 2015-12-22 MED ORDER — MULTIVITAMIN PO LIQD: Freq: Every day | ORAL | Status: DC

## 2015-12-22 MED ORDER — LOSARTAN POTASSIUM 50 MG PO TABS
100.0000 mg | ORAL_TABLET | Freq: Every day | ORAL | Status: DC
  Administered 2015-12-22 – 2015-12-24 (×3): 100 mg via ORAL
  Filled 2015-12-22 (×4): qty 2

## 2015-12-22 MED ORDER — ONDANSETRON HCL 4 MG/2ML IJ SOLN: 4.0000 mg | Freq: Four times a day (QID) | INTRAMUSCULAR | Status: DC | PRN

## 2015-12-22 MED ORDER — CLOPIDOGREL BISULFATE 75 MG PO TABS
75.0000 mg | ORAL_TABLET | Freq: Every day | ORAL | Status: DC
  Administered 2015-12-22 – 2015-12-23 (×2): 75 mg via ORAL
  Filled 2015-12-22 (×2): qty 1

## 2015-12-22 MED ORDER — LABETALOL HCL 5 MG/ML IV SOLN
10.0000 mg | Freq: Once | INTRAVENOUS | Status: AC
  Administered 2015-12-22: 10 mg via INTRAVENOUS
  Filled 2015-12-22: qty 4

## 2015-12-22 MED ORDER — ALBUTEROL SULFATE (2.5 MG/3ML) 0.083% IN NEBU: 2.5000 mg | INHALATION_SOLUTION | Freq: Four times a day (QID) | RESPIRATORY_TRACT | Status: DC | PRN

## 2015-12-22 MED ORDER — CARVEDILOL 25 MG PO TABS
25.0000 mg | ORAL_TABLET | Freq: Two times a day (BID) | ORAL | Status: DC
  Administered 2015-12-22 – 2015-12-24 (×4): 25 mg via ORAL
  Filled 2015-12-22 (×4): qty 1

## 2015-12-22 MED ORDER — GABAPENTIN 300 MG PO CAPS
300.0000 mg | ORAL_CAPSULE | Freq: Every day | ORAL | Status: DC | PRN
  Administered 2015-12-23: 300 mg via ORAL
  Filled 2015-12-22: qty 1

## 2015-12-22 MED ORDER — SENNOSIDES-DOCUSATE SODIUM 8.6-50 MG PO TABS: 1.0000 | ORAL_TABLET | Freq: Every evening | ORAL | Status: DC | PRN

## 2015-12-22 MED ORDER — SPIRONOLACTONE 25 MG PO TABS
25.0000 mg | ORAL_TABLET | Freq: Every day | ORAL | Status: DC
  Administered 2015-12-22 – 2015-12-24 (×3): 25 mg via ORAL
  Filled 2015-12-22 (×3): qty 1

## 2015-12-22 MED ORDER — HEPARIN SODIUM (PORCINE) 5000 UNIT/ML IJ SOLN
5000.0000 [IU] | Freq: Three times a day (TID) | INTRAMUSCULAR | Status: DC
  Administered 2015-12-22 – 2015-12-24 (×5): 5000 [IU] via SUBCUTANEOUS
  Filled 2015-12-22 (×6): qty 1

## 2015-12-22 NOTE — Care Management Obs Status (Signed)
[place] NOTIFICATION   Patient Details  Name: Cody Silva MRN: [id number] Date of Birth: 07-06-1946   Medicare Observation Status Notification Given:  Yes (evaluate syncope)    Robbie Lis, RN 12/22/2015, 4:00 PM

## 2015-12-22 NOTE — H&P (Signed)
History and Physical    Cody Silva [id number] DOB: [birth year] DOA: 12/22/2015  Referring MD/NP/PA: Merrily Pew, MD PCP: [facility]  Patient coming from: [facility]  Chief Complaint: dizziness  HPI: Cody Silva is a 70 y.o. 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  Cody Silva reports that while he was at the eye doctor, getting his eyes checked for glasses, he acutely developed sweating, nausea and felt like he was going to pass out.  He did not lose consciousness or having any seizure like twitching per him.  The symptoms resolved with time, but while the event was ongoing his blood pressure was checked and found to be 80/50.  EMS was called.  When EMS came, his blood pressure had bounced back to the [id number] systolic.  He reports not eating at all that day as well.  The symptoms resolved with time.  He is not sure what precipitated it.  He has had a stroke in 2014 with residual right hand weakness.  He had a TIA in January of this year.  Further symptoms include SOB with aerosols and chronic ringing in the ear.  He reports that he had a 30 day holter monitor which was just taken off.  However, per review of notes there is a note that says he no-showed for this.  Per discharged summary from TIA in January, his stroke presented as a presyncopal episode, but he was supposed to have a holter monitor.    When I saw him, his symptoms had completely resolved.   ED Course: Acute labs done in the ED showed a mildly elevated troponin to 0.04, which was stable X 2.  He had negative troponins in January.  EKG showed LVH and nonspecific TWI  Review of Systems: As per HPI otherwise 10 point review of systems negative. Reports ringing in ears.    Past Medical History  Diagnosis Date  . Hypertension   . Diabetic retinopathy ([place])   . Diabetic  neuropathy ([place])   . Colon polyps   . History of blood transfusion 10/30/2014    hematochezia  . Anemia   . OSA on CPAP     "suppose to wear mask; I've got a call in for an equipment change" (10/30/2014)  . Type II diabetes mellitus ([place])   . Headache     "@ least 3 times/wk" (10/30/2014)  . Stroke [facility]) 2014    left extremity deficits; facial left  . Arthritis     "right arm, right ankle, right side" (10/30/2014)  . History of gout   . PTSD (post-traumatic stress disorder)     "service related"    Past Surgical History  Procedure Laterality Date  . Cataract extraction w/ intraocular lens  implant, bilateral Bilateral 2014-2015  . Tumor removal Right ~ 1976    "arm; had to take bone left hip to add to the repair"  . Bone graft hip iliac crest Left ~ 1976    Reports that he has never smoked. He has never used smokeless tobacco. He reports that he drinks about 0.6 oz of alcohol per week. He reports that he does not use illicit drugs.  Allergies  Allergen Reactions  . Metformin And Related Diarrhea    Family History  Problem Relation Age of Onset  . Diabetes Mother   . Breast cancer Mother   . Stroke Brother   .  Neurofibromatosis Maternal Uncle   . Heart attack Mother     CABG - Age 14  . Lung disease Father     Prior to Admission medications   Medication Sig Start Date End Date Taking? Authorizing Provider  Aspirin-Salicylamide-Caffeine (BC HEADACHE POWDER PO) Take 1 packet by mouth daily as needed (pain).   Yes Historical Provider, MD  carvedilol (COREG) 25 MG tablet Take 25 mg by mouth 2 (two) times daily with a meal. Reported on 09/14/2015   Yes Historical Provider, MD  fluocinonide cream (LIDEX) [id number] % Apply 1 application topically 2 (two) times daily.   Yes Historical Provider, MD  fluticasone (FLONASE) 50 MCG/ACT nasal spray Place 2 sprays into the nose daily as needed for allergies.    Yes Historical Provider, MD  gabapentin (NEURONTIN) 300 MG capsule Take 300 mg by  mouth daily as needed (leg pain).    Yes Historical Provider, MD  Ginkgo Biloba (GNP GINGKO BILOBA EXTRACT PO) Take 1 capsule by mouth daily.   Yes Historical Provider, MD  insulin aspart (NOVOLOG) 100 UNIT/ML injection Inject 5-15 Units into the skin 3 (three) times daily with meals. Per sliding scale   Yes Historical Provider, MD  insulin glargine (LANTUS) 100 UNIT/ML injection Inject 30 Units into the skin at bedtime.    Yes Historical Provider, MD  latanoprost (XALATAN) 0.005 % ophthalmic solution Place 1 drop into both eyes at bedtime.    Yes Historical Provider, MD  loratadine (CLARITIN) 10 MG tablet Take 10 mg by mouth daily as needed for allergies.   Yes Historical Provider, MD  losartan (COZAAR) 100 MG tablet Take 100 mg by mouth daily.   Yes Historical Provider, MD  Multiple Vitamins-Minerals (MULTIVITAMIN PO) Take 1 tablet by mouth daily.   Yes Historical Provider, MD  polyvinyl alcohol (LIQUIFILM TEARS) 1.4 % ophthalmic solution Place 1 drop into both eyes 5 (five) times daily as needed (for dry eyes).   Yes Historical Provider, MD  simvastatin (ZOCOR) 40 MG tablet Take 40 mg by mouth every evening.   Yes Historical Provider, MD  Skin Protectants, Misc. (EUCERIN) cream Apply 1 application topically daily.    Yes Historical Provider, MD  spironolactone (ALDACTONE) 25 MG tablet Take 25 mg by mouth daily.   Yes Historical Provider, MD  acetaminophen (TYLENOL) 500 MG tablet Take 1 tablet (500 mg total) by mouth every 6 (six) hours as needed for mild pain, moderate pain, fever or headache. 11/01/14   Juluis Mire, MD  albuterol (PROVENTIL HFA;VENTOLIN HFA) 108 (90 BASE) MCG/ACT inhaler Inhale 2 puffs into the lungs every 6 (six) hours as needed for wheezing.    Historical Provider, MD  citalopram (CELEXA) 20 MG tablet Take 20 mg by mouth daily.    Historical Provider, MD  cloNIDine (CATAPRES - DOSED IN MG/24 HR) 0.3 mg/24hr Place 1 patch onto the skin once a week. No specific day    Historical  Provider, MD  clopidogrel (PLAVIX) 75 MG tablet Take 1 tablet (75 mg total) by mouth at bedtime. 11/08/14   Juluis Mire, MD           Physical Exam: Filed Vitals:   12/22/15 1333 12/22/15 1530 12/22/15 1535 12/22/15 1600  BP: 181/93 163/95 169/96 164/94  Pulse: 85 73 75 77  Temp:      TempSrc:      Resp: 29 15 14 18   SpO2: 93% 100% 100% 100%      Constitutional: NAD, calm, comfortable Filed Vitals:   12/22/15 1333  12/22/15 1530 12/22/15 1535 12/22/15 1600  BP: 181/93 163/95 169/96 164/94  Pulse: 85 73 75 77  Temp:      TempSrc:      Resp: 29 15 14 18   SpO2: 93% 100% 100% 100%   Eyes: PERRL, Injected sclerae bilaterally ENMT: Mucous membranes are moist. Posterior pharynx clear of any exudate or lesions.  Normal dentition.  Neck: normal, supple, no masses, No JVD Respiratory: clear to auscultation bilaterally, no wheezing, no crackles. Normal respiratory effort. No accessory muscle use.  Cardiovascular: Regular rate and rhythm, no murmurs / rubs / gallops. + extremity edema to ankles.  Abdomen: no tenderness, no masses palpated. Bowel sounds positive.  Musculoskeletal: no clubbing / cyanosis. Normal muscle tone.  Skin: no rashes, lesions, ulcers.  Neurologic: CN 2-12 grossly intact. Sensation intact. Strength 5/5 in all 4, except 4+/5 in grip strength of right hand.  Psychiatric: Normal judgment and insight. Alert and oriented x 3. Normal mood.    Labs on Admission: I have personally reviewed following labs and imaging studies  CBC:  Recent Labs Lab 12/22/15 1231  WBC 4.1  NEUTROABS 3.0  HGB 12.4*  HCT 35.3*  MCV 85.5  PLT [id number]   Basic Metabolic Panel:  Recent Labs Lab 12/22/15 1231  NA 142  K 3.3*  CL 106  CO2 30  GLUCOSE 139*  BUN 19  CREATININE 1.63*  CALCIUM 9.3   GFR: Estimated Creatinine Clearance: 54 mL/min (by C-G formula based on Cr of 1.63). Liver Function Tests:  Recent Labs Lab 12/22/15 1231  AST 27  ALT 22  ALKPHOS 63  BILITOT  0.6  PROT 7.2  ALBUMIN 3.7   Cardiac Enzymes:  Recent Labs Lab 12/22/15 1231 12/22/15 1649  TROPONINI 0.04* 0.04*   CBG:  Recent Labs Lab 12/22/15 1129 12/22/15 1701  GLUCAP 156* 117*   Urine analysis:    Component Value Date/Time   COLORURINE AMBER* 09/14/2015 1923   APPEARANCEUR CLEAR 09/14/2015 1923   LABSPEC 1.024 09/14/2015 1923   PHURINE 5.0 09/14/2015 1923   GLUCOSEU 100* 09/14/2015 1923   HGBUR TRACE* 09/14/2015 1923   BILIRUBINUR NEGATIVE 09/14/2015 [place] NEGATIVE 09/14/2015 1923   PROTEINUR >300* 09/14/2015 1923   UROBILINOGEN 1.0 11/02/2014 2301   NITRITE NEGATIVE 09/14/2015 1923   LEUKOCYTESUR NEGATIVE 09/14/2015 1923    Radiological Exams on Admission: Dg Chest 2 View  12/22/2015  CLINICAL DATA:  70 year old male with increased weakness, near syncope today. Initial encounter. EXAM: CHEST  2 VIEW COMPARISON:  03/17/2014 and earlier. FINDINGS: Lung volumes remain normal. Stable cardiomegaly and mediastinal contours. Visualized tracheal air column is within normal limits. No pneumothorax, pulmonary edema, pleural effusion or confluent pulmonary opacity. No acute osseous abnormality identified. IMPRESSION: Stable cardiomegaly. No acute cardiopulmonary abnormality. Electronically Signed   By: Genevie Ann M.D.   On: 12/22/2015 12:31   Ct Head Wo Contrast  12/22/2015  CLINICAL DATA:  70 year old male with syncopal episode today. Initial encounter. EXAM: CT HEAD WITHOUT CONTRAST TECHNIQUE: Contiguous axial images were obtained from the base of the skull through the vertex without intravenous contrast. COMPARISON:  Brain MRI and MRA 09/14/2015 and earlier. FINDINGS: Visualized paranasal sinuses and mastoids are clear. Stable scalp soft tissues, no scalp hematoma identified. Stable orbits soft tissues. No acute osseous abnormality identified. Mild Calcified atherosclerosis at the skull base. Mild intracranial artery dolichoectasia. Cerebral volume is within normal  limits for age. Scattered dural calcifications. No midline shift, ventriculomegaly, mass effect, evidence of mass lesion, intracranial  hemorrhage or evidence of cortically based acute infarction. Gray-white matter differentiation is within normal limits throughout the brain. IMPRESSION: Stable and negative for age non contrast CT appearance of the brain. Electronically Signed   By: Genevie Ann M.D.   On: 12/22/2015 12:22    EKG: Independently reviewed. LVH with non specific TWI  Assessment/Plan  Near Syncope - Symptoms seem most likely to be related to not eating or vasovagal due to eye strain.  However, he was recently admitted with TIA like symptoms and was supposed to have a holter monitor.  Per cardiology consult, holter was not helpful - Telemetry - Trend troponin, currently flat - AM EKG - EKG for chest pain - Consider loop recorder - Cardiology consult noted - Check TTE  HTN - Home meds of coreg, clonidine patch, losartan, spironolactone continued - He is currently hypertensive, will add prn hydralazine - Monitor closely.  Hypotension likely related to syncopal event.   H/O TIA/CVA - His neuro exam is non focal at this time except for chronic issues - CT head negative - Do not think this was a neurological event.  - Neuro checks q8 hours, if any changes, would consider MRI brain  CKD 3 - Cr at baseline, monitor for changes - BMET in the AM  DM2 with retinopathy and neuropathy - Continue insulin glargine - SSI  Hypokalemia - K repleted orally with magnesium  Diet Heart healthy   DVT prophylaxis: Heparin Code Status: Full Disposition Plan: Home tomorrow if doing well Consults called: Cardiology, Dr. Tamala Julian Admission status: Obs   Gilles Chiquito MD [facility] Pager [phone]  If 7PM-7AM, please contact night-coverage [email or web address] Password [email or web address]  12/22/2015, 6:01 PM

## 2015-12-22 NOTE — Progress Notes (Signed)
Entered in d/c instructions  Cody Bible Cody Silva on 06/15/2016 You have an appointment for your 6 month follow up with Dr Hassell Done on 06/15/16 at 11 am  [address] [place] [address] [place] an appointment as soon as possible for a visit As needed [place] [address] [phone]

## 2015-12-22 NOTE — ED Notes (Signed)
Per EMS- Patient was at the [place] in [place] for an eye appointment and had a near syncopal episode. Patient was given NS 200 ml prior to EMS arrival. [place] stated BP ws 80/50 Initial BP for EMS was 150/84. patien tc/o generalized weakness.

## 2015-12-22 NOTE — Consult Note (Addendum)
Name: Cody Silva is a 70 y.o. male Admit date: 12/22/2015 Referring Physician:  [facility], Dr. Gilles Chiquito Primary Physician:  [facility] Primary Cardiologist:  Vaughan Browner  Reason for Consultation:  Near syncope  ASSESSMENT: 1. Near syncope in a patient with prior similar episodes dating back into the 1990s. Also recent presentations with weakness associated with transient ischemic attacks. 2. Prior transient ischemic attacks and CVA 3. Diabetes mellitus, type II with complications including eyes, kidney, and vascular 4. Essential hypertension 5. Hyperlipidemia 6. Chronic kidney disease, stage III 7. New inferior Q waves on EKG   PLAN: 1. Presyncope with presentation very similar to that which occurred in January 2017. Complete workup done at bedtime including neuro consult, thirty-day cardiac monitor, and echocardiogram. The studies do not need to be repeated. 2. Consider repeat neuro consult given extensive prior history of stroke and TIA 3. Recent 30 day monitor was not very helpful as the patient was disconnected much of the time, with only 15 hours of usable data. No significant arrhythmias were noted in that brief time frame. 4. Consider loop recorder implant 5. Replete potassium 6. Cycle cardiac markers 7. Telemetry   HPI: 70 year old with history of presyncope dating back into the 1990s. More recent hospitalizations have been for transient neurological deficits. 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 Workup in January 2017 was negative. Cardiology consult was obtained at that time and included 2-D echocardiogram and attempted 30 day monitor.  On this occasion the patient was waiting at the [facility] in [place] when he began feeling weak. There was no double vision, blurred vision, or motor deficits. He did not pass out but he  felt very weak. Because of the complaints he was brought by EMS to the emergency room at [facility]. At the time of this interview and exam the patient is back to his baseline. CT without contrast is unremarkable.  Medical problems include type 2 diabetes with complications, PTSD, untreated obstructive sleep apnea, essential hypertension, depression, chronic kidney disease stage III, and hyperlipidemia. He denies tobacco use.   PMH:   Past Medical History  Diagnosis Date  . Hypertension   . Diabetic retinopathy ([place])   . Diabetic neuropathy ([place])   . Colon polyps   . History of blood transfusion 10/30/2014    hematochezia  . Anemia   . OSA on CPAP     "suppose to wear mask; I've got a call in for an equipment change" (10/30/2014)  . Type II diabetes mellitus ([place])   . Headache     "@ least 3 times/wk" (10/30/2014)  . Stroke [facility]) 2014    left extremity deficits; facial left  . Arthritis     "right arm, right ankle, right side" (10/30/2014)  . History of gout   . PTSD (post-traumatic stress disorder)     "service related"    PSH:   Past Surgical History  Procedure Laterality Date  . Cataract extraction w/ intraocular lens  implant, bilateral Bilateral 2014-2015  . Tumor removal Right ~ 1976    "arm; had to take bone left hip to add to the repair"  . Bone graft hip iliac crest Left ~ 1976   Allergies:  Metformin and related Prior to Admit Meds:   (Not in a hospital admission) Fam HX:    Family History  Problem Relation Age of Onset  .  Diabetes Mother   . Breast cancer Mother   . Stroke Brother   . Neurofibromatosis Maternal Uncle   . Heart attack Mother     CABG - Age 4   Social HX:    Social History   Social History  . Marital Status: Married    Spouse Name: sheila  . Number of Children: 4  . Years of Education: 16   Occupational History  . Not on file.   Social History Main Topics  . Smoking status: Never Smoker   . Smokeless tobacco: Never Used    . Alcohol Use: 0.6 oz/week    0 Standard drinks or equivalent, 1 Cans of beer per week     Comment: 10/30/2014 "might have a beer a couple times/yr"  . Drug Use: No  . Sexual Activity: Yes   Other Topics Concern  . Not on file   Social History Narrative   Patient is married with 4 children   Patient is right handed   Patient has a Water quality scientist degree    Patient 4 cups daily     Review of Systems: Having difficulty with vision. He was at the [facility] to see an ophthalmologist today.[name] He also has occasional neck discomfort with left arm weakness that he feels his cervical spine related. All other systems are negative.  Physical Exam: Blood pressure 164/94, pulse 77, temperature 97.7 F (36.5 C), temperature source Oral, resp. rate 18, SpO2 100 %. Weight change:    Well-developed well-nourished AA male in no distress lying comfortably. HEENT exam reveals scleral plethora/erythema. No jaundice Neck exam reveals no JVD or carotid bruits Chest reveals faint bibasilar crackles Cardiac exam reveals an apical systolic murmur. S4 gallop is also audible. Abdomen is soft. Bowel sounds are normal. Extremities reveal no edema. The neuro exam reveals no motor sensory deficits. Mild weakness right arm.  Labs: Lab Results  Component Value Date   WBC 4.1 12/22/2015   HGB 12.4* 12/22/2015   HCT 35.3* 12/22/2015   MCV 85.5 12/22/2015   PLT 159 12/22/2015    Recent Labs Lab 12/22/15 1231  NA 142  K 3.3*  CL 106  CO2 30  BUN 19  CREATININE 1.63*  CALCIUM 9.3  PROT 7.2  BILITOT 0.6  ALKPHOS 63  ALT 22  AST 27  GLUCOSE 139*   No results found for: PTT Lab Results  Component Value Date   INR 1.22 09/14/2015   INR 1.22 10/30/2014   Lab Results  Component Value Date   TROPONINI 0.04* 12/22/2015     Lab Results  Component Value Date   CHOL 215* 09/15/2015   CHOL 134 02/21/2013   Lab Results  Component Value Date   HDL 58 09/15/2015   HDL 61 02/21/2013   Lab  Results  Component Value Date   LDLCALC 137* 09/15/2015   LDLCALC 55 02/21/2013   Lab Results  Component Value Date   TRIG 99 09/15/2015   TRIG 90 02/21/2013   Lab Results  Component Value Date   CHOLHDL 3.7 09/15/2015   CHOLHDL 2.2 02/21/2013   No results found for: LDLDIRECT    Radiology:  Dg Chest 2 View  12/22/2015  CLINICAL DATA:  70 year old male with increased weakness, near syncope today. Initial encounter. EXAM: CHEST  2 VIEW COMPARISON:  03/17/2014 and earlier. FINDINGS: Lung volumes remain normal. Stable cardiomegaly and mediastinal contours. Visualized tracheal air column is within normal limits. No pneumothorax, pulmonary edema, pleural effusion or confluent pulmonary opacity.  No acute osseous abnormality identified. IMPRESSION: Stable cardiomegaly. No acute cardiopulmonary abnormality. Electronically Signed   By: Genevie Ann M.D.   On: 12/22/2015 12:31   Ct Head Wo Contrast  12/22/2015  CLINICAL DATA:  70 year old male with syncopal episode today. Initial encounter. EXAM: CT HEAD WITHOUT CONTRAST TECHNIQUE: Contiguous axial images were obtained from the base of the skull through the vertex without intravenous contrast. COMPARISON:  Brain MRI and MRA 09/14/2015 and earlier. FINDINGS: Visualized paranasal sinuses and mastoids are clear. Stable scalp soft tissues, no scalp hematoma identified. Stable orbits soft tissues. No acute osseous abnormality identified. Mild Calcified atherosclerosis at the skull base. Mild intracranial artery dolichoectasia. Cerebral volume is within normal limits for age. Scattered dural calcifications. No midline shift, ventriculomegaly, mass effect, evidence of mass lesion, intracranial hemorrhage or evidence of cortically based acute infarction. Gray-white matter differentiation is within normal limits throughout the brain. IMPRESSION: Stable and negative for age non contrast CT appearance of the brain. Electronically Signed   By: Genevie Ann M.D.   On:  12/22/2015 12:22    EKG:  Normal sinus rhythm, prominent voltage, inferior Q waves, left ventricular hypertrophy.. Inferior Q waves are new.    Sinclair Grooms 12/22/2015 4:25 PM

## 2015-12-22 NOTE — ED Notes (Signed)
Bed: [id number] Expected date:  Expected time:  Means of arrival:  Comments: Ems- hypotension

## 2015-12-22 NOTE — ED Provider Notes (Signed)
CSN: [id number]     Arrival date & time 12/22/15  1117 History   First MD Initiated Contact with Patient 12/22/15 1135     Chief Complaint  Patient presents with  . Weakness  . Near Syncope     (Consider location/radiation/quality/duration/timing/severity/associated sxs/prior Treatment) Patient is a 70 y.o. male presenting with near-syncope.  Near Syncope This is a new problem. The current episode started less than 1 hour ago. The problem occurs constantly. The problem has been resolved. Associated symptoms include abdominal pain and headaches. Pertinent negatives include no chest pain. Nothing aggravates the symptoms. Relieved by: time and fluids. He has tried nothing for the symptoms.    Past Medical History  Diagnosis Date  . Hypertension   . Diabetic retinopathy ([place])   . Diabetic neuropathy ([place])   . Colon polyps   . History of blood transfusion 10/30/2014    hematochezia  . Anemia   . OSA on CPAP     "suppose to wear mask; I've got a call in for an equipment change" (10/30/2014)  . Type II diabetes mellitus ([place])   . Headache     "@ least 3 times/wk" (10/30/2014)  . Stroke [facility]) 2014    left extremity deficits; facial left  . Arthritis     "right arm, right ankle, right side" (10/30/2014)  . History of gout   . PTSD (post-traumatic stress disorder)     "service related"   Past Surgical History  Procedure Laterality Date  . Cataract extraction w/ intraocular lens  implant, bilateral Bilateral 2014-2015  . Tumor removal Right ~ 1976    "arm; had to take bone left hip to add to the repair"  . Bone graft hip iliac crest Left ~ 1976   Family History  Problem Relation Age of Onset  . Diabetes Mother   . Breast cancer Mother   . Stroke Brother   . Neurofibromatosis Maternal Uncle   . Heart attack Mother     CABG - Age 80   Social History  Substance Use Topics  . Smoking status: Never Smoker   . Smokeless tobacco: Never Used  . Alcohol Use: 0.6 oz/week    0  Standard drinks or equivalent, 1 Cans of beer per week     Comment: 10/30/2014 "might have a beer a couple times/yr"    Review of Systems  Cardiovascular: Positive for near-syncope. Negative for chest pain.  Gastrointestinal: Positive for nausea and abdominal pain.  Neurological: Positive for headaches.  All other systems reviewed and are negative.     Allergies  Metformin and related  Home Medications   Prior to Admission medications   Medication Sig Start Date End Date Taking? Authorizing Provider  acetaminophen (TYLENOL) 500 MG tablet Take 1 tablet (500 mg total) by mouth every 6 (six) hours as needed for mild pain, moderate pain, fever or headache. 11/01/14   Juluis Mire, MD  albuterol (PROVENTIL HFA;VENTOLIN HFA) 108 (90 BASE) MCG/ACT inhaler Inhale 2 puffs into the lungs every 6 (six) hours as needed for wheezing.    Historical Provider, MD  carvedilol (COREG) 25 MG tablet Take 25 mg by mouth 2 (two) times daily with a meal. Reported on 09/14/2015    Historical Provider, MD  citalopram (CELEXA) 20 MG tablet Take 20 mg by mouth daily.    Historical Provider, MD  cloNIDine (CATAPRES - DOSED IN MG/24 HR) 0.3 mg/24hr Place 1 patch onto the skin once a week. No specific day    Historical Provider,  MD  clopidogrel (PLAVIX) 75 MG tablet Take 1 tablet (75 mg total) by mouth at bedtime. 11/08/14   Juluis Mire, MD  fluocinonide cream (LIDEX) [id number] % Apply 1 application topically 2 (two) times daily.    Historical Provider, MD  fluticasone (FLONASE) 50 MCG/ACT nasal spray Place 2 sprays into the nose daily as needed for allergies.     Historical Provider, MD  gabapentin (NEURONTIN) 300 MG capsule Take 300 mg by mouth daily as needed (leg pain).     Historical Provider, MD  insulin aspart (NOVOLOG) 100 UNIT/ML injection Inject 5-15 Units into the skin 3 (three) times daily with meals. Per sliding scale    Historical Provider, MD  insulin glargine (LANTUS) 100 UNIT/ML injection Inject 30-49  Units into the skin at bedtime.     Historical Provider, MD  latanoprost (XALATAN) 0.005 % ophthalmic solution Place 1 drop into both eyes at bedtime.     Historical Provider, MD  losartan (COZAAR) 100 MG tablet Take 100 mg by mouth daily.    Historical Provider, MD  Multiple Vitamins-Minerals (MULTIVITAMIN PO) Take 1 tablet by mouth daily.    Historical Provider, MD  niacin 100 MG tablet Take 1 tablet (100 mg total) by mouth at bedtime. 09/16/15   Thurnell Lose, MD  polyvinyl alcohol (LIQUIFILM TEARS) 1.4 % ophthalmic solution Place 1 drop into both eyes 5 (five) times daily as needed (for dry eyes).    Historical Provider, MD  simvastatin (ZOCOR) 40 MG tablet Take 40 mg by mouth every evening.    Historical Provider, MD  Skin Protectants, Misc. (EUCERIN) cream Apply 1 application topically daily.     Historical Provider, MD  spironolactone (ALDACTONE) 25 MG tablet Take 25 mg by mouth daily.    Historical Provider, MD   BP 185/99 mmHg  Pulse 82  Temp(Src) 97.7 F (36.5 C) (Oral)  Resp 17  SpO2 100% Physical Exam  Constitutional: He is oriented to person, place, and time. He appears well-developed and well-nourished.  HENT:  Head: Normocephalic and atraumatic.  Neck: Normal range of motion.  Cardiovascular: Normal rate.   Pulmonary/Chest: Effort normal. No respiratory distress.  Abdominal: He exhibits no distension.  Musculoskeletal: Normal range of motion. He exhibits no edema or tenderness.  Neurological: He is alert and oriented to person, place, and time.  No altered mental status, able to give full seemingly accurate history.  Face is symmetric, EOM's intact, pupils equal and reactive, vision intact, tongue and uvula midline without deviation Upper and Lower extremity motor 5/5, intact pain perception in distal extremities, 2+ reflexes in biceps, patella and achilles tendons. Finger to nose normal, heel to shin normal.  Nursing note and vitals reviewed.   ED Course  Procedures  (including critical care time) Labs Review Labs Reviewed  CBC WITH DIFFERENTIAL/PLATELET - Abnormal; Notable for the following:    RBC 4.13 (*)    Hemoglobin 12.4 (*)    HCT 35.3 (*)    All other components within normal limits  COMPREHENSIVE METABOLIC PANEL - Abnormal; Notable for the following:    Potassium 3.3 (*)    Glucose, Bld 139 (*)    Creatinine, Ser 1.63 (*)    GFR calc non Af Amer 42 (*)    GFR calc Af Amer 48 (*)    All other components within normal limits  TROPONIN I - Abnormal; Notable for the following:    Troponin I 0.04 (*)    All other components within normal limits  GLUCOSE,  CAPILLARY - Abnormal; Notable for the following:    Glucose-Capillary 117 (*)    All other components within normal limits  CBG MONITORING, ED - Abnormal; Notable for the following:    Glucose-Capillary 156 (*)    All other components within normal limits  TSH  HEMOGLOBIN A1C  TROPONIN I  TROPONIN I  TROPONIN I  TROPONIN I  TROPONIN I  TROPONIN I  BASIC METABOLIC PANEL  CBC    Imaging Review Dg Chest 2 View  12/22/2015  CLINICAL DATA:  70 year old male with increased weakness, near syncope today. Initial encounter. EXAM: CHEST  2 VIEW COMPARISON:  03/17/2014 and earlier. FINDINGS: Lung volumes remain normal. Stable cardiomegaly and mediastinal contours. Visualized tracheal air column is within normal limits. No pneumothorax, pulmonary edema, pleural effusion or confluent pulmonary opacity. No acute osseous abnormality identified. IMPRESSION: Stable cardiomegaly. No acute cardiopulmonary abnormality. Electronically Signed   By: Genevie Ann M.D.   On: 12/22/2015 12:31   Ct Head Wo Contrast  12/22/2015  CLINICAL DATA:  70 year old male with syncopal episode today. Initial encounter. EXAM: CT HEAD WITHOUT CONTRAST TECHNIQUE: Contiguous axial images were obtained from the base of the skull through the vertex without intravenous contrast. COMPARISON:  Brain MRI and MRA 09/14/2015 and earlier.  FINDINGS: Visualized paranasal sinuses and mastoids are clear. Stable scalp soft tissues, no scalp hematoma identified. Stable orbits soft tissues. No acute osseous abnormality identified. Mild Calcified atherosclerosis at the skull base. Mild intracranial artery dolichoectasia. Cerebral volume is within normal limits for age. Scattered dural calcifications. No midline shift, ventriculomegaly, mass effect, evidence of mass lesion, intracranial hemorrhage or evidence of cortically based acute infarction. Gray-white matter differentiation is within normal limits throughout the brain. IMPRESSION: Stable and negative for age non contrast CT appearance of the brain. Electronically Signed   By: Genevie Ann M.D.   On: 12/22/2015 12:22   I have personally reviewed and evaluated these images and lab results as part of my medical decision-making.   EKG Interpretation   Date/Time:  Wednesday December 22 2015 11:43:03 EDT Ventricular Rate:  85 PR Interval:  167 QRS Duration: 110 QT Interval:  398 QTC Calculation: 473 R Axis:   11 Text Interpretation:  Sinus rhythm LVH with IVCD and secondary repol abnrm  Inferior infarct, age indeterminate Baseline wander in lead(s) I II aVR  Confirmed by [facility] MD, Corene Cornea [phone]) on 12/22/2015 12:59:10 PM      MDM   Final diagnoses:  None    High risk near syncope, new LVH, positive troponin likely secondary to HTN. D/W cardiology who recommended medicine admission and they would see him this afternoon.    Merrily Pew, MD 12/22/15 [phone]

## 2015-12-22 NOTE — ED Notes (Signed)
Patient transported to X-ray 

## 2015-12-22 NOTE — ED Notes (Signed)
16:02 pt can go to floor .

## 2015-12-23 ENCOUNTER — Observation Stay (HOSPITAL_BASED_OUTPATIENT_CLINIC_OR_DEPARTMENT_OTHER): Payer: Medicare Other

## 2015-12-23 DIAGNOSIS — E1121 Type 2 diabetes mellitus with diabetic nephropathy: Secondary | ICD-10-CM | POA: Diagnosis present

## 2015-12-23 DIAGNOSIS — R55 Syncope and collapse: Secondary | ICD-10-CM | POA: Diagnosis not present

## 2015-12-23 DIAGNOSIS — I10 Essential (primary) hypertension: Secondary | ICD-10-CM | POA: Diagnosis not present

## 2015-12-23 DIAGNOSIS — Z8673 Personal history of transient ischemic attack (TIA), and cerebral infarction without residual deficits: Secondary | ICD-10-CM

## 2015-12-23 DIAGNOSIS — E1122 Type 2 diabetes mellitus with diabetic chronic kidney disease: Secondary | ICD-10-CM | POA: Diagnosis not present

## 2015-12-23 DIAGNOSIS — N183 Chronic kidney disease, stage 3 (moderate): Secondary | ICD-10-CM

## 2015-12-23 DIAGNOSIS — I129 Hypertensive chronic kidney disease with stage 1 through stage 4 chronic kidney disease, or unspecified chronic kidney disease: Secondary | ICD-10-CM | POA: Diagnosis not present

## 2015-12-23 DIAGNOSIS — R079 Chest pain, unspecified: Secondary | ICD-10-CM | POA: Diagnosis not present

## 2015-12-23 LAB — ECHOCARDIOGRAM COMPLETE
HEIGHTINCHES: 73 in
Weight: 3416 oz

## 2015-12-23 LAB — CBC
HEMATOCRIT: 31.3 % — AB (ref 39.0–52.0)
HEMOGLOBIN: 10.9 g/dL — AB (ref 13.0–17.0)
MCH: 29.8 pg (ref 26.0–34.0)
MCHC: 34.8 g/dL (ref 30.0–36.0)
MCV: 85.5 fL (ref 78.0–100.0)
Platelets: 156 10*3/uL (ref 150–400)
RBC: 3.66 MIL/uL — ABNORMAL LOW (ref 4.22–5.81)
RDW: 13.4 % (ref 11.5–15.5)
WBC: 4.5 10*3/uL (ref 4.0–10.5)

## 2015-12-23 LAB — BASIC METABOLIC PANEL
ANION GAP: 9 (ref 5–15)
BUN: 21 mg/dL — AB (ref 6–20)
CO2: 28 mmol/L (ref 22–32)
Calcium: 9 mg/dL (ref 8.9–10.3)
Chloride: 106 mmol/L (ref 101–111)
Creatinine, Ser: 1.89 mg/dL — ABNORMAL HIGH (ref 0.61–1.24)
GFR calc Af Amer: 40 mL/min — ABNORMAL LOW (ref >60)
GFR calc non Af Amer: 35 mL/min — ABNORMAL LOW (ref >60)
GLUCOSE: 136 mg/dL — AB (ref 65–99)
POTASSIUM: 3.2 mmol/L — AB (ref 3.5–5.1)
Sodium: 143 mmol/L (ref 135–145)

## 2015-12-23 LAB — GLUCOSE, CAPILLARY
GLUCOSE-CAPILLARY: 111 mg/dL — AB (ref 65–99)
GLUCOSE-CAPILLARY: 144 mg/dL — AB (ref 65–99)
Glucose-Capillary: 171 mg/dL — ABNORMAL HIGH (ref 65–99)
Glucose-Capillary: 116 mg/dL — ABNORMAL HIGH (ref 65–99)

## 2015-12-23 LAB — TROPONIN I
TROPONIN I: 0.03 ng/mL (ref <0.031)
TROPONIN I: 0.03 ng/mL (ref <0.031)

## 2015-12-23 LAB — HEMOGLOBIN A1C
HEMOGLOBIN A1C: 7.9 % — AB (ref 4.8–5.6)
Mean Plasma Glucose: 180 mg/dL

## 2015-12-23 MED ORDER — POTASSIUM CHLORIDE CRYS ER 20 MEQ PO TBCR
40.0000 meq | EXTENDED_RELEASE_TABLET | ORAL | Status: AC
  Administered 2015-12-23 (×2): 40 meq via ORAL
  Filled 2015-12-23 (×3): qty 2

## 2015-12-23 NOTE — Progress Notes (Signed)
Initial Nutrition Assessment  DOCUMENTATION CODES:   Not applicable  INTERVENTION:  - Recommend liberalize to Regular diet from [facility] if medically feasible - RD will continue to monitor for needs  NUTRITION DIAGNOSIS:   Inadequate oral intake related to poor appetite, acute illness as evidenced by per patient/family report.  GOAL:   Patient will meet greater than or equal to 90% of their needs  MONITOR:   PO intake, Weight trends, Labs, I & O's  REASON FOR ASSESSMENT:   Malnutrition Screening Tool  ASSESSMENT:   70 y.o. 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 Mr. Massett reports that while he was at the eye doctor, getting his eyes checked for glasses, he acutely developed sweating, nausea and felt like he was going to pass out. He did not lose consciousness or having any seizure like twitching per him. The symptoms resolved with time, but while the event was ongoing his blood pressure was checked and found to be 80/50. EMS was called. When EMS came, his blood pressure had bounced back to the 123456 systolic. He reports not eating at all that day as well. The symptoms resolved with time. He is not sure what precipitated it. He has had a stroke in 2014 with residual right hand weakness. He had a TIA in January of this year. Further symptoms include SOB with aerosols and chronic ringing in the ear.  Pt seen for MST. BMI indicates overweight status. Per chart review, pt consumed 25% of dinner yesterday and he reports <50% completion of breakfast this AM which consisted of [place] bacon, grits, fruit cup, and toast. Pt denies abdominal pain or nausea at this time but states that he had a small amount of diarrhea after breakfast. He states that this was intermittently occuring PTA but he is unsure of any foods or drinks that it was  specifically related to. Pt denies nausea or vomiting with these occurences of diarrhea. Pt states that intakes have been decreased the past 2-3 days. Pt states he feels he could eat more if diet was liberalized; gives the example of needing a little bit of salt with grits. Pt with hx of HTN and hypercholesterolemia which Cardiology PA note from this AM states is likely resultant of Agent Orange exposure.   Pt states that he sometimes has difficulty swallowing related to throat tightening s/p stroke. He states no items make this better or worse and that it occurs at random. Pt denies chewing difficulties. He states that due to R handed weakness he often eats with his L hand. 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 Pt states that he was working with outpatient OT who provided him with adapted utensils to assist.   He states UBW of 235 lbs and that he last weighed this 1 year ago. Per chart review, pt has lost 24 lbs (10% body weight) in the past 9 months which is not significant for time frame. Per chart, pt was 214 lbs on 09/14/15 and then gained weight to 223 lbs on 12/15/15 and since that time has lost 10 lbs (4% body weight); this weight loss is significant for 8 day time frame. Will need to continue to monitor weight trends closely. Pt does not meet criteria for malnutrition at this time  but will monitor and document accordingly at follow-up.  Pt states that PCP in [place] stated that he was going to sign pt up for outpatient DM nutrition class; pt denies any specific nutrition-related questions or concerns at this time but informed him to alert RN should he have questions/concerns prior to d/c.  Not meeting needs for 2-36 days. Medications reviewed; 40 mEq orla KCl x2 doses today. Labs reviewed; K: 3.2 mmol/L, BUN/creatinine elevated and trending up, GFR: 40.   Diet Order:  Diet Heart Room service  appropriate?: Yes; Fluid consistency:: Thin  Skin:  Reviewed, no issues  Last BM:  4/18  Height:   Ht Readings from Last 1 Encounters:  12/22/15 6\' 1"  (1.854 m)    Weight:   Wt Readings from Last 1 Encounters:  12/23/15 213 lb 8 oz (96.843 kg)    Ideal Body Weight:  83.64 kg (kg)  BMI:  Body mass index is 28.17 kg/(m^2).  Estimated Nutritional Needs:   Kcal:  1600-1800  Protein:  75-90 grams  Fluid:  >/= 1.8 L/day  EDUCATION NEEDS:   No education needs identified at this time     Jarome Matin, RD, LDN Inpatient Clinical Dietitian Pager # [phone] After hours/weekend pager # [phone]

## 2015-12-23 NOTE — Consult Note (Addendum)
Patient Name: Cody Silva Date of Encounter: 12/23/2015  Active Problems:   Hypertension   History of CVA (cerebrovascular accident)   TIA (transient ischemic attack)   CKD stage 3 due to type 2 diabetes mellitus ([place])   Near syncope   Primary Cardiologist: Dr. 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 He felt weak while waiting at the [place] and was brought to [facility].  Head CT unremarkable.   SUBJECTIVE:   OBJECTIVE Filed Vitals:   12/22/15 1840 12/22/15 1842 12/22/15 2209 12/23/15 0604  BP: 141/76 118/66 168/82 185/89  Pulse: 78 81 74 71  Temp:   98.6 F (37 C) 98.5 F (36.9 C)  TempSrc:   Oral Oral  Resp:   20 20  Height:      Weight:    213 lb 8 oz (96.843 kg)  SpO2:   100% 100%    Intake/Output Summary (Last 24 hours) at 12/23/15 0750 Last data filed at 12/22/15 2300  Gross per 24 hour  Intake    240 ml  Output      0 ml  Net    240 ml   Filed Weights   12/22/15 1837 12/23/15 0604  Weight: 220 lb (99.791 kg) 213 lb 8 oz (96.843 kg)    PHYSICAL EXAM General: Well developed, well nourished, male in no acute distress. Head: Normocephalic, atraumatic.  Neck: Supple without bruits, JVD. Lungs:  Resp regular and unlabored, CTA. Heart: RRR, S1, S2, no S3, S4, or murmur; no rub. Abdomen: Soft, non-tender, non-distended, BS + x 4.  Extremities: No clubbing, cyanosis, edema.  Neuro: Alert and oriented X 3. Moves all extremities spontaneously. Psych: Normal affect.  LABS: CBC: Recent Labs  12/22/15 1231 12/23/15 0424  WBC 4.1 4.5  NEUTROABS 3.0  --   HGB 12.4* 10.9*  HCT 35.3* 31.3*  MCV 85.5 85.5  PLT 159 [id number]   Basic Metabolic Panel: Recent Labs  12/22/15 1231 12/23/15 0424  NA 142 143  K 3.3* 3.2*  CL 106 106  CO2 30 28  GLUCOSE 139* 136*  BUN 19 21*  CREATININE 1.63* 1.89*  CALCIUM 9.3 9.0   Liver Function  Tests: Recent Labs  12/22/15 1231  AST 27  ALT 22  ALKPHOS 63  BILITOT 0.6  PROT 7.2  ALBUMIN 3.7   Cardiac Enzymes: Recent Labs  12/22/15 1649 12/22/15 2248 12/23/15 0424  TROPONINI 0.04* 0.04* 0.03   Hemoglobin A1C: Recent Labs  12/22/15 1649  HGBA1C 7.9*   Thyroid Function Tests: Recent Labs  12/22/15 1649  TSH 0.739     Current facility-administered medications:  .  acetaminophen (TYLENOL) tablet 500 mg, 500 mg, Oral, Q6H PRN, Sid Falcon, MD, 500 mg at 12/22/15 1732 .  albuterol (PROVENTIL) (2.5 MG/3ML) 0.083% nebulizer solution 2.5 mg, 2.5 mg, Nebulization, Q6H PRN, Sid Falcon, MD .  carvedilol (COREG) tablet 25 mg, 25 mg, Oral, BID WC, Sid Falcon, MD, 25 mg at 12/22/15 1728 .  citalopram (CELEXA) tablet 20 mg, 20 mg, Oral, Daily, Sid Falcon, MD, 20 mg at 12/22/15 2054 .  [START ON 12/25/2015] cloNIDine (CATAPRES - Dosed in mg/24 hr) patch 0.3 mg, 0.3 mg, Transdermal, Weekly, Sid Falcon, MD .  clopidogrel (PLAVIX) tablet 75 mg, 75 mg, Oral, QHS, Sid Falcon, MD, 75 mg at 12/22/15 2257 .  gabapentin (NEURONTIN) capsule  300 mg, 300 mg, Oral, Daily PRN, Sid Falcon, MD .  heparin injection 5,000 Units, 5,000 Units, Subcutaneous, Q8H, Sid Falcon, MD, 5,000 Units at 12/23/15 0606 .  hydrALAZINE (APRESOLINE) tablet 10 mg, 10 mg, Oral, Q8H PRN, Sid Falcon, MD .  insulin aspart (novoLOG) injection 0-5 Units, 0-5 Units, Subcutaneous, QHS, Sid Falcon, MD, 0 Units at 12/22/15 2227 .  insulin aspart (novoLOG) injection 0-9 Units, 0-9 Units, Subcutaneous, TID WC, Sid Falcon, MD, 0 Units at 12/22/15 1700 .  insulin glargine (LANTUS) injection 30 Units, 30 Units, Subcutaneous, QHS, Sid Falcon, MD, 30 Units at 12/22/15 2257 .  latanoprost (XALATAN) 0.005 % ophthalmic solution 1 drop, 1 drop, Both Eyes, QHS, Sid Falcon, MD, 1 drop at 12/22/15 2257 .  loratadine (CLARITIN) tablet 10 mg, 10 mg, Oral, Daily PRN, Sid Falcon, MD .   losartan (COZAAR) tablet 100 mg, 100 mg, Oral, Daily, Sid Falcon, MD, 100 mg at 12/22/15 1728 .  multivitamin with minerals tablet 1 tablet, 1 tablet, Oral, Daily, Sid Falcon, MD, 1 tablet at 12/22/15 1728 .  ondansetron (ZOFRAN) tablet 4 mg, 4 mg, Oral, Q6H PRN **OR** ondansetron (ZOFRAN) injection 4 mg, 4 mg, Intravenous, Q6H PRN, Sid Falcon, MD .  oxyCODONE (Oxy IR/ROXICODONE) immediate release tablet 5 mg, 5 mg, Oral, Q4H PRN, Sid Falcon, MD .  polyvinyl alcohol (LIQUIFILM TEARS) 1.4 % ophthalmic solution 1 drop, 1 drop, Both Eyes, 5 X Daily PRN, Sid Falcon, MD .  senna-docusate (Senokot-S) tablet 1 tablet, 1 tablet, Oral, QHS PRN, Sid Falcon, MD .  simvastatin (ZOCOR) tablet 40 mg, 40 mg, Oral, QPM, Sid Falcon, MD, 40 mg at 12/22/15 1728 .  sodium chloride flush (NS) 0.9 % injection 3 mL, 3 mL, Intravenous, Q12H, Sid Falcon, MD, 3 mL at 12/22/15 2258 .  spironolactone (ALDACTONE) tablet 25 mg, 25 mg, Oral, Daily, Sid Falcon, MD, 25 mg at 12/22/15 1728   TELE:  NSR       Radiology/Studies: Dg Chest 2 View  12/22/2015  CLINICAL DATA:  70 year old male with increased weakness, near syncope today. Initial encounter. EXAM: CHEST  2 VIEW COMPARISON:  03/17/2014 and earlier. FINDINGS: Lung volumes remain normal. Stable cardiomegaly and mediastinal contours. Visualized tracheal air column is within normal limits. No pneumothorax, pulmonary edema, pleural effusion or confluent pulmonary opacity. No acute osseous abnormality identified. IMPRESSION: Stable cardiomegaly. No acute cardiopulmonary abnormality. Electronically Signed   By: Genevie Ann M.D.   On: 12/22/2015 12:31   Ct Head Wo Contrast  12/22/2015  CLINICAL DATA:  70 year old male with syncopal episode today. Initial encounter. EXAM: CT HEAD WITHOUT CONTRAST TECHNIQUE: Contiguous axial images were obtained from the base of the skull through the vertex without intravenous contrast. COMPARISON:  Brain MRI and MRA  09/14/2015 and earlier. FINDINGS: Visualized paranasal sinuses and mastoids are clear. Stable scalp soft tissues, no scalp hematoma identified. Stable orbits soft tissues. No acute osseous abnormality identified. Mild Calcified atherosclerosis at the skull base. Mild intracranial artery dolichoectasia. Cerebral volume is within normal limits for age. Scattered dural calcifications. No midline shift, ventriculomegaly, mass effect, evidence of mass lesion, intracranial hemorrhage or evidence of cortically based acute infarction. Gray-white matter differentiation is within normal limits throughout the brain. IMPRESSION: Stable and negative for age non contrast CT appearance of the brain. Electronically Signed   By: Genevie Ann M.D.   On: 12/22/2015 12:22     Current Medications:  .  carvedilol  25 mg Oral BID WC  . citalopram  20 mg Oral Daily  . [START ON 12/25/2015] cloNIDine  0.3 mg Transdermal Weekly  . clopidogrel  75 mg Oral QHS  . heparin  5,000 Units Subcutaneous Q8H  . insulin aspart  0-5 Units Subcutaneous QHS  . insulin aspart  0-9 Units Subcutaneous TID WC  . insulin glargine  30 Units Subcutaneous QHS  . latanoprost  1 drop Both Eyes QHS  . losartan  100 mg Oral Daily  . multivitamin with minerals  1 tablet Oral Daily  . simvastatin  40 mg Oral QPM  . sodium chloride flush  3 mL Intravenous Q12H  . spironolactone  25 mg Oral Daily      ASSESSMENT AND PLAN: Active Problems:   Hypertension   History of CVA (cerebrovascular accident)   TIA (transient ischemic attack)   CKD stage 3 due to type 2 diabetes mellitus ([place])   Near syncope  1. Near syncope in a patient with prior similar episodes dating back into the 21s. Also recent presentations with weakness associated with transient ischemic attacks. Complete work up done in Jan. 2017 including Neuro consult, 30 day monitor and echo.  Consider repeat neuro consult given extensive prior history of stroke and TIA. Review of tele shows no  arrhythmia. Recent 30 day monitor was not helpful as patient was disconnected most of the time.   2. Prior transient ischemic attacks and CVA: Last seen by Neuro this month. His BP was noted to be high in the office, patient stated that he was going to PCP to work on meds for better control of BP. He is on Plavix for secondary stroke prevention.   3. Diabetes mellitus, type II with complications including eyes, kidney, and vascular: A1c is 7.9.  Continue sliding scale and long acting insulin.    4. Essential hypertension: Patient's BP is elevated, as high as SBP's 180-190's.  He is on clonidine patch, ARB, beta blocker. According to patient he has had a renal ultrasound to evaluate for renal artery stenosis, he doesn't remember when or the results. Can consider repeating.   5. Hyperlipidemia: LDL is 137, patient is on high intensity statin.   6. Chronic kidney disease, stage III: creatinine is 1.89, baseline appears to be 1.6-1.7.  BUN is 21. Patient is on ARB.   7. New inferior Q waves on EKG: Troponin 0.04 x3.  No chest pain.  Echo is pending.   8. Exposure to agent orange: Patient is a [place] veteran who has been exposed to [employer]. His symptoms of hypercholesteremia and HTN are consistent with this.   Signed, Arbutus Leas , NP 7:50 AM 12/23/2015 Pager [phone] The patient has been seen in conjunction with Jettie Booze, NP. All aspects of care have been considered and discussed. The patient has been personally interviewed, examined, and all clinical data has been reviewed.   After considering multiple recurrent episodes of weakness in this patient and a recent ineffective 30 day monitor, I believe we should have a loop recorder placed to exclude the possibility of atrial arrhythmias could cause stroke. Would also be helpful to exclude bradycardia arrhythmia that could be contributing to recurring episodes of near syncope.

## 2015-12-23 NOTE — Progress Notes (Signed)
  Echocardiogram 2D Echocardiogram has been performed.  Tresa Res 12/23/2015, 11:44 AM

## 2015-12-23 NOTE — Progress Notes (Signed)
[name]  Cody Silva [id number] DOB: 06/07/1946 DOA: 12/22/2015 PCP: [facility]  Assessment/Plan: Near Syncope - Symptoms seem most likely to be related to not eating or vasovagal due to eye strain. However, he was recently admitted with TIA like symptoms. -after discussing with cardiology and given the fact his holter monitoring was inconclusive; will plan of placing loop recorder -no abnormalities seen on telemetry  HTN - Home meds of coreg, clonidine patch, losartan, spironolactone continued - He is currently hypertensive, will add prn hydralazine - Monitor closely.  H/O TIA/CVA - His neuro exam is non focal at this time except for chronic issues - CT head negative for acute abnormalities  - Do not think this was a neurological event.  - continue risk factor modifications and plavix for secondary prevention   CKD 3 - Cr at baseline, monitor for changes - BMET in the AM  DM2 with retinopathy and neuropathy - Continue SSI and lantus -CBG's 140-170  Hypokalemia -will replete and follow trend   Depression -continue celexa  HLD -continue statins  Code Status: full Family Communication: no family at bedside; but patient AAOX3 and all questions answered  Disposition Plan: remains in the hospital, plan is for loop recorder insertion on 4/21   Consultants:  Cardiology   Procedures:  2-D echo: - Left ventricle: The cavity size was normal. There was moderate  concentric hypertrophy. Systolic function was normal. The  estimated ejection fraction was in the range of 55% to 60%. Wall  motion was normal; there were no regional wall motion  abnormalities. Doppler parameters are consistent with abnormal  left ventricular relaxation (grade 1 diastolic dysfunction).  Doppler parameters are consistent with elevated ventricular  end-diastolic filling pressure. - Aortic valve: Trileaflet; normal thickness leaflets. There  was  mild regurgitation. - Aortic root: The aortic root was normal in size. - Ascending aorta: The ascending aorta was mildly dilated measurinh  41 mm. - Mitral valve: Mildly thickened leaflets . - Left atrium: The atrium was moderately dilated. - Right ventricle: Systolic function was normal. - Right atrium: The atrium was normal in size. - Tricuspid valve: There was trivial regurgitation. - Pulmonic valve: There was no regurgitation. - Pulmonary arteries: Systolic pressure was within the normal  range. - Inferior vena cava: The vessel was normal in size. - Pericardium, extracardiac: There was no pericardial effusion.   Loop Recorder insertion 4/21  Antibiotics:  None   HPI/Subjective: Afebrile, no CP, no SOB. Denies palpitations and lightheadedness   Objective: Filed Vitals:   12/23/15 0604 12/23/15 1444  BP: 185/89 165/87  Pulse: 71 72  Temp: 98.5 F (36.9 C) 98.5 F (36.9 C)  Resp: 20 20    Intake/Output Summary (Last 24 hours) at 12/23/15 1809 Last data filed at 12/23/15 1300  Gross per 24 hour  Intake    720 ml  Output      0 ml  Net    720 ml   Filed Weights   12/22/15 1837 12/23/15 0604  Weight: 99.791 kg (220 lb) 96.843 kg (213 lb 8 oz)    Exam:   General:  Afebrile, feeling better and denying any focal neurologic deficit. Patient denies palpitations and CP  Cardiovascular: S1 and s2, positive SEM, no rubs or gallops  Respiratory: good air movement, no wheezing or crackles on exam  Abdomen: soft, NT, ND, positive BS  Musculoskeletal: no cyanosis, trace edema bilaterally  Data Reviewed: Basic Metabolic Panel:  Recent Labs Lab 12/22/15 1231  12/23/15 0424  NA 142 143  K 3.3* 3.2*  CL 106 106  CO2 30 28  GLUCOSE 139* 136*  BUN 19 21*  CREATININE 1.63* 1.89*  CALCIUM 9.3 9.0   GFR Estimated Creatinine Clearance: 45.9 mL/min (by C-G formula based on Cr of 1.89).  Liver Function Tests:  Recent Labs Lab 12/22/15 1231  AST 27   ALT 22  ALKPHOS 63  BILITOT 0.6  PROT 7.2  ALBUMIN 3.7   CBC:  Recent Labs Lab 12/22/15 1231 12/23/15 0424  WBC 4.1 4.5  NEUTROABS 3.0  --   HGB 12.4* 10.9*  HCT 35.3* 31.3*  MCV 85.5 85.5  PLT 159 156   Cardiac Enzymes:  Recent Labs Lab 12/22/15 1231 12/22/15 1649 12/22/15 2248 12/23/15 0424 12/23/15 1127  TROPONINI 0.04* 0.04* 0.04* 0.03 0.03   CBG:  Recent Labs Lab 12/22/15 1701 12/22/15 2206 12/23/15 0822 12/23/15 1211 12/23/15 1652  GLUCAP 117* 163* 111* 171* 144*   Studies: Dg Chest 2 View  12/22/2015  CLINICAL DATA:  70 year old male with increased weakness, near syncope today. Initial encounter. EXAM: CHEST  2 VIEW COMPARISON:  03/17/2014 and earlier. FINDINGS: Lung volumes remain normal. Stable cardiomegaly and mediastinal contours. Visualized tracheal air column is within normal limits. No pneumothorax, pulmonary edema, pleural effusion or confluent pulmonary opacity. No acute osseous abnormality identified. IMPRESSION: Stable cardiomegaly. No acute cardiopulmonary abnormality. Electronically Signed   By: Genevie Ann M.D.   On: 12/22/2015 12:31   Ct Head Wo Contrast  12/22/2015  CLINICAL DATA:  70 year old male with syncopal episode today. Initial encounter. EXAM: CT HEAD WITHOUT CONTRAST TECHNIQUE: Contiguous axial images were obtained from the base of the skull through the vertex without intravenous contrast. COMPARISON:  Brain MRI and MRA 09/14/2015 and earlier. FINDINGS: Visualized paranasal sinuses and mastoids are clear. Stable scalp soft tissues, no scalp hematoma identified. Stable orbits soft tissues. No acute osseous abnormality identified. Mild Calcified atherosclerosis at the skull base. Mild intracranial artery dolichoectasia. Cerebral volume is within normal limits for age. Scattered dural calcifications. No midline shift, ventriculomegaly, mass effect, evidence of mass lesion, intracranial hemorrhage or evidence of cortically based acute  infarction. Gray-white matter differentiation is within normal limits throughout the brain. IMPRESSION: Stable and negative for age non contrast CT appearance of the brain. Electronically Signed   By: Genevie Ann M.D.   On: 12/22/2015 12:22    Scheduled Meds: . carvedilol  25 mg Oral BID WC  . citalopram  20 mg Oral Daily  . [START ON 12/25/2015] cloNIDine  0.3 mg Transdermal Weekly  . clopidogrel  75 mg Oral QHS  . heparin  5,000 Units Subcutaneous Q8H  . insulin aspart  0-5 Units Subcutaneous QHS  . insulin aspart  0-9 Units Subcutaneous TID WC  . insulin glargine  30 Units Subcutaneous QHS  . latanoprost  1 drop Both Eyes QHS  . losartan  100 mg Oral Daily  . multivitamin with minerals  1 tablet Oral Daily  . simvastatin  40 mg Oral QPM  . sodium chloride flush  3 mL Intravenous Q12H  . spironolactone  25 mg Oral Daily   Continuous Infusions:   Active Problems:   Hypertension   History of CVA (cerebrovascular accident)   TIA (transient ischemic attack)   CKD stage 3 due to type 2 diabetes mellitus ([place])   Near syncope    Time spent: 30 minutes    Barton Dubois  [facility] Pager [phone]. If 7PM-7AM, please contact night-coverage at  [email or web address], password [email or web address] 12/23/2015, 6:09 PM

## 2015-12-24 ENCOUNTER — Encounter (HOSPITAL_COMMUNITY)
Admission: EM | Disposition: A | Discharge status: Home / Self Care | Payer: Self-pay | Source: Home / Self Care | Attending: Emergency Medicine

## 2015-12-24 ENCOUNTER — Encounter (HOSPITAL_COMMUNITY): Payer: Self-pay | Admitting: Internal Medicine

## 2015-12-24 DIAGNOSIS — I10 Essential (primary) hypertension: Secondary | ICD-10-CM | POA: Diagnosis not present

## 2015-12-24 DIAGNOSIS — Z8673 Personal history of transient ischemic attack (TIA), and cerebral infarction without residual deficits: Secondary | ICD-10-CM | POA: Diagnosis not present

## 2015-12-24 DIAGNOSIS — R55 Syncope and collapse: Secondary | ICD-10-CM | POA: Diagnosis not present

## 2015-12-24 DIAGNOSIS — I639 Cerebral infarction, unspecified: Secondary | ICD-10-CM | POA: Diagnosis not present

## 2015-12-24 DIAGNOSIS — E1122 Type 2 diabetes mellitus with diabetic chronic kidney disease: Secondary | ICD-10-CM | POA: Diagnosis not present

## 2015-12-24 DIAGNOSIS — Z77098 Contact with and (suspected) exposure to other hazardous, chiefly nonmedicinal, chemicals: Secondary | ICD-10-CM

## 2015-12-24 HISTORY — PX: EP IMPLANTABLE DEVICE: SHX172B

## 2015-12-24 LAB — GLUCOSE, CAPILLARY
GLUCOSE-CAPILLARY: 80 mg/dL (ref 65–99)
Glucose-Capillary: 223 mg/dL — ABNORMAL HIGH (ref 65–99)

## 2015-12-24 SURGERY — LOOP RECORDER INSERTION

## 2015-12-24 MED ORDER — LIDOCAINE-EPINEPHRINE 1 %-1:100000 IJ SOLN
INTRAMUSCULAR | Status: DC | PRN
  Administered 2015-12-24: 10 mL via INTRADERMAL

## 2015-12-24 MED ORDER — LIDOCAINE-EPINEPHRINE 1 %-1:100000 IJ SOLN
20.0000 mL | Freq: Once | INTRAMUSCULAR | Status: DC
  Filled 2015-12-24: qty 20

## 2015-12-24 SURGICAL SUPPLY — 2 items
LOOP REVEAL LINQSYS (Prosthesis & Implant Heart) ×3 IMPLANT
PACK LOOP INSERTION (CUSTOM PROCEDURE TRAY) ×3 IMPLANT

## 2015-12-24 NOTE — H&P (View-Only) (Signed)
ELECTROPHYSIOLOGY CONSULT NOTE  Patient ID: Cody Silva MRN: [id number], DOB/AGE: [birth year]   Admit date: 12/22/2015 Date of Consult: 12/24/2015  Primary Physician: Cody Silva   Reason for Consultation: Cryptogenic stroke; recommendations regarding Implantable Loop Recorder  History of Present Illness Cody Silva was admitted on 12/22/2015 with presyncope while having an eye procedure.  Though this is felt to likely be vagal in etiology, he has had multiple episodes of abrupt presyncope in the past of unclear etiology.  He has a normal EF by echo.  Prior workup has been unrevealing.  He has worn a 30 day monitor but was unable to wear it for full duration and no arrhythmias were detected.  He also has a prior stroke and TIAs for which he has been evaluated by Cody Silva in the office setting.  . he has been monitored on telemetry which has demonstrated no arrhythmias. No cause has been identified.  EP has been asked to evaluate for placement of an implantable loop recorder to monitor for atrial fibrillation as well as for causes of his presyncope.  Past Medical History Past Medical History  Diagnosis Date  . Hypertension   . Diabetic retinopathy ([place])   . Diabetic neuropathy ([place])   . Colon polyps   . History of blood transfusion 10/30/2014    hematochezia  . Anemia   . OSA on CPAP     "suppose to wear mask; I've got a call in for an equipment change" (10/30/2014)  . Type II diabetes mellitus ([place])   . Headache     "@ least 3 times/wk" (10/30/2014)  . Stroke [facility]) 2014    left extremity deficits; facial left  . Arthritis     "right arm, right ankle, right side" (10/30/2014)  . History of gout   . PTSD (post-traumatic stress disorder)     "service related"    Past Surgical History Past Surgical History  Procedure Laterality Date  . Cataract extraction w/ intraocular lens  implant, bilateral Bilateral 2014-2015  . Tumor removal Right ~ 1976    "arm; had to  take bone left hip to add to the repair"  . Bone graft hip iliac crest Left ~ 1976    Allergies/Intolerances Allergies  Allergen Reactions  . Metformin And Related Diarrhea   Inpatient Medications . [MAR Hold] carvedilol  25 mg Oral BID WC  . [MAR Hold] citalopram  20 mg Oral Daily  . [MAR Hold] cloNIDine  0.3 mg Transdermal Weekly  . [MAR Hold] clopidogrel  75 mg Oral QHS  . [MAR Hold] heparin  5,000 Units Subcutaneous Q8H  . [MAR Hold] insulin aspart  0-5 Units Subcutaneous QHS  . [MAR Hold] insulin aspart  0-9 Units Subcutaneous TID WC  . [MAR Hold] insulin glargine  30 Units Subcutaneous QHS  . [MAR Hold] latanoprost  1 drop Both Eyes QHS  . lidocaine-EPINEPHrine  20 mL Infiltration Once  . [MAR Hold] losartan  100 mg Oral Daily  . [MAR Hold] multivitamin with minerals  1 tablet Oral Daily  . [MAR Hold] simvastatin  40 mg Oral QPM  . [MAR Hold] sodium chloride flush  3 mL Intravenous Q12H  . [MAR Hold] spironolactone  25 mg Oral Daily     Social History Social History   Social History  . Marital Status: Married    Spouse Name: Cody Silva  . Number of Children: 4  . Years of Education: 16   Occupational History  . Not on file.  Social History Main Topics  . Smoking status: Never Smoker   . Smokeless tobacco: Never Used  . Alcohol Use: 0.6 oz/week    0 Standard drinks or equivalent, 1 Cans of beer per week     Comment: 10/30/2014 "might have a beer a couple times/yr"  . Drug Use: No  . Sexual Activity: Yes   Other Topics Concern  . Not on file   Social History Narrative   Patient is married with 4 children   Patient is right handed   Patient has a Water quality scientist degree    Patient 4 cups daily    Review of Systems General: No chills, fever, night sweats or weight changes  Cardiovascular:  No chest pain, dyspnea on exertion, edema, orthopnea, palpitations, paroxysmal nocturnal dyspnea Dermatological: No rash, lesions or masses Respiratory: No cough,  dyspnea Urologic: No hematuria, dysuria Abdominal: No nausea, vomiting, diarrhea, bright red blood per rectum, melena, or hematemesis Neurologic: No visual changes, weakness, changes in mental status All other systems reviewed and are otherwise negative except as noted above.  Physical Exam Blood pressure 196/95, pulse 68, temperature 97.7 F (36.5 C), temperature source Oral, resp. rate 18, height 6\' 1"  (1.854 m), weight 209 lb 14.1 oz (95.2 kg), SpO2 100 %.  General: Well developed, well appearing 70 y.o. male in no acute distress. HEENT: Normocephalic, atraumatic. EOMs intact. Sclera nonicteric. Oropharynx clear.  Neck: Supple without bruits. No JVD. Lungs: Respirations regular and unlabored, CTA bilaterally. No wheezes, rales or rhonchi. Heart: RRR. S1, S2 present. No murmurs, rub, S3 or S4. Abdomen: Soft, non-tender, non-distended. BS present x 4 quadrants. No hepatosplenomegaly.  Extremities: No clubbing, cyanosis or edema. DP/PT/Radials 2+ and equal bilaterally. Psych: Normal affect. Neuro: Alert and oriented X 3. Moves all extremities spontaneously. Musculoskeletal: No kyphosis. Skin: Intact. Warm and dry. No rashes or petechiae in exposed areas.   Labs Lab Results  Component Value Date   WBC 4.5 12/23/2015   HGB 10.9* 12/23/2015   HCT 31.3* 12/23/2015   MCV 85.5 12/23/2015   PLT 156 12/23/2015    Recent Labs Lab 12/22/15 1231 12/23/15 0424  NA 142 143  K 3.3* 3.2*  CL 106 106  CO2 30 28  BUN 19 21*  CREATININE 1.63* 1.89*  CALCIUM 9.3 9.0  PROT 7.2  --   BILITOT 0.6  --   ALKPHOS 63  --   ALT 22  --   AST 27  --   GLUCOSE 139* 136*   No results for input(s): INR in the last 72 hours.  Radiology/Studies Dg Chest 2 View  12/22/2015  CLINICAL DATA:  70 year old male with increased weakness, near syncope today. Initial encounter. EXAM: CHEST  2 VIEW COMPARISON:  03/17/2014 and earlier. FINDINGS: Lung volumes remain normal. Stable cardiomegaly and mediastinal  contours. Visualized tracheal air column is within normal limits. No pneumothorax, pulmonary edema, pleural effusion or confluent pulmonary opacity. No acute osseous abnormality identified. IMPRESSION: Stable cardiomegaly. No acute cardiopulmonary abnormality. Electronically Signed   By: Genevie Ann M.D.   On: 12/22/2015 12:31   Ct Head Wo Contrast  12/22/2015  CLINICAL DATA:  70 year old male with syncopal episode today. Initial encounter. EXAM: CT HEAD WITHOUT CONTRAST TECHNIQUE: Contiguous axial images were obtained from the base of the skull through the vertex without intravenous contrast. COMPARISON:  Brain MRI and MRA 09/14/2015 and earlier. FINDINGS: Visualized paranasal sinuses and mastoids are clear. Stable scalp soft tissues, no scalp hematoma identified. Stable orbits soft tissues. No acute osseous abnormality  identified. Mild Calcified atherosclerosis at the skull base. Mild intracranial artery dolichoectasia. Cerebral volume is within normal limits for age. Scattered dural calcifications. No midline shift, ventriculomegaly, mass effect, evidence of mass lesion, intracranial hemorrhage or evidence of cortically based acute infarction. Gray-white matter differentiation is within normal limits throughout the brain. IMPRESSION: Stable and negative for age non contrast CT appearance of the brain. Electronically Signed   By: Genevie Ann M.D.   On: 12/22/2015 12:22    Echocardiogram  Reviewed, EF normal  12-lead ECG all ekgs in epic are reviewed, no afib  Telemetry no arrhythmia   Assessment and Plan 1. Cryptogenic stroke/ TIA/ presyncope  I recommend loop recorder insertion to monitor for AF as well as to further evaluate presyncope. The indication for loop recorder insertion / monitoring for AF in setting of cryptogenic stroke was discussed with the patient. The loop recorder insertion procedure was reviewed in detail including risks and benefits. These risks include but are not limited to bleeding  and infection. The patient expressed verbal understanding and agrees to proceed. The patient was also counseled regarding wound care and device follow-up.  No further inpatient EP/ cardiology workup planned post implant. Ok to discharge from my standpoint  Follow-up to be arranged in EP device Silva for wound check.  Army Fossa 12/24/2015, 9:28 AM

## 2015-12-24 NOTE — Progress Notes (Addendum)
Subjective:  Awake and alert, no further "weak spells"  Objective:  Vital Signs in the last 24 hours: Temp:  [97.3 F (36.3 C)-98.5 F (36.9 C)] 98.2 F (36.8 C) (04/21 0551) Pulse Rate:  [61-76] 61 (04/21 0551) Resp:  [18-20] 18 (04/21 0551) BP: (155-185)/(80-98) 155/80 mmHg (04/21 0551) SpO2:  [100 %] 100 % (04/21 0551) Weight:  [209 lb 14.1 oz (95.2 kg)] 209 lb 14.1 oz (95.2 kg) (04/21 0551)  Intake/Output from previous day:  Intake/Output Summary (Last 24 hours) at 12/24/15 0729 Last data filed at 12/24/15 0000  Gross per 24 hour  Intake    720 ml  Output      0 ml  Net    720 ml    Physical Exam: General appearance: alert, cooperative and no distress Neck: no carotid bruit and no JVD Lungs: clear to auscultation bilaterally Heart: regular rate and rhythm Extremities: no edema Neurologic: Grossly normal   Rate: 62  Rhythm: normal sinus rhythm  Lab Results:  Recent Labs  12/22/15 1231 12/23/15 0424  WBC 4.1 4.5  HGB 12.4* 10.9*  PLT 159 156    Recent Labs  12/22/15 1231 12/23/15 0424  NA 142 143  K 3.3* 3.2*  CL 106 106  CO2 30 28  GLUCOSE 139* 136*  BUN 19 21*  CREATININE 1.63* 1.89*    Recent Labs  12/23/15 0424 12/23/15 1127  TROPONINI 0.03 0.03   No results for input(s): INR in the last 72 hours.  Scheduled Meds: . carvedilol  25 mg Oral BID WC  . citalopram  20 mg Oral Daily  . [START ON 12/25/2015] cloNIDine  0.3 mg Transdermal Weekly  . clopidogrel  75 mg Oral QHS  . heparin  5,000 Units Subcutaneous Q8H  . insulin aspart  0-5 Units Subcutaneous QHS  . insulin aspart  0-9 Units Subcutaneous TID WC  . insulin glargine  30 Units Subcutaneous QHS  . latanoprost  1 drop Both Eyes QHS  . losartan  100 mg Oral Daily  . multivitamin with minerals  1 tablet Oral Daily  . simvastatin  40 mg Oral QPM  . sodium chloride flush  3 mL Intravenous Q12H  . spironolactone  25 mg Oral Daily   Continuous Infusions:  PRN  Meds:.acetaminophen, albuterol, gabapentin, hydrALAZINE, loratadine, ondansetron **OR** ondansetron (ZOFRAN) IV, oxyCODONE, polyvinyl alcohol, senna-docusate   Imaging: CT Head 12/22/15 COMPARISON: Brain MRI and MRA 09/14/2015 and earlier.  FINDINGS: Visualized paranasal sinuses and mastoids are clear. Stable scalp soft tissues, no scalp hematoma identified. Stable orbits soft tissues. No acute osseous abnormality identified.  Mild Calcified atherosclerosis at the skull base. Mild intracranial artery dolichoectasia. Cerebral volume is within normal limits for age. Scattered dural calcifications. No midline shift, ventriculomegaly, mass effect, evidence of mass lesion, intracranial hemorrhage or evidence of cortically based acute infarction. Gray-white matter differentiation is within normal limits throughout the brain.  IMPRESSION: Stable and negative for age non contrast CT appearance of the brain.   Cardiac Studies: Echo 12/23/15 Study Conclusions  - Left ventricle: The cavity size was normal. There was moderate  concentric hypertrophy. Systolic function was normal. The  estimated ejection fraction was in the range of 55% to 60%. Wall  motion was normal; there were no regional wall motion  abnormalities. Doppler parameters are consistent with abnormal  left ventricular relaxation (grade 1 diastolic dysfunction).  Doppler parameters are consistent with elevated ventricular  end-diastolic filling pressure. - Aortic valve: Trileaflet; normal thickness leaflets. There was  mild regurgitation. - Aortic root: The aortic root was normal in size. - Ascending aorta: The ascending aorta was mildly dilated measurinh  41 mm. - Mitral valve: Mildly thickened leaflets . - Left atrium: The atrium was moderately dilated. - Right ventricle: Systolic function was normal. - Right atrium: The atrium was normal in size. - Tricuspid valve: There was trivial regurgitation. -  Pulmonic valve: There was no regurgitation. - Pulmonary arteries: Systolic pressure was within the normal  range. - Inferior vena cava: The vessel was normal in size. - Pericardium, extracardiac: There was no pericardial effusion.   Assessment/Plan:  71 year old male with a past medical history of HTN, DM, CVA (/2014), TIA (09/2015), and presyncope. He felt weak while waiting at the [place] and was brought to [facility] 12/22/15. The patient has had similar episodes dating back into the 1990s.  Complete work up done in Jan. 2017 including Neuro consult, 30 day monitor and echo. 30 day monitor was not useful as the pt did not wear it much of the time.   Principal Problem:   Near syncope Active Problems:   Hypertension   CKD stage 3 due to type 2 diabetes mellitus ([place])   Type 2 diabetes with nephropathy ([place])   History of CVA (cerebrovascular accident)   History of TIAs   H/O agent Orange exposure   PLAN: Loop recorder today  Kerin Ransom PA-C 12/24/2015, 7:29 AM [phone]  The patient has been seen in conjunction with Kerin Ransom, PAC. All aspects of care have been considered and discussed. The patient has been personally interviewed, examined, and all clinical data has been reviewed.   May discharge after Loop implant.

## 2015-12-24 NOTE — Discharge Summary (Signed)
Physician Discharge Summary  Cody Silva [id number] DOB: [birth year] DOA: 12/22/2015  PCP: [facility]  Admit date: 12/22/2015 Discharge date: 12/24/2015  Time spent: 35 minutes  Recommendations for Outpatient Follow-up:  Repeat BMET to follow electrolytes and renal function Reassess BP and adjust antihypertensive drugs as needed   Discharge Diagnoses:  Principal Problem:   Near syncope Active Problems:   Hypertension   History of CVA (cerebrovascular accident)   History of TIAs   CKD stage 3 due to type 2 diabetes mellitus ([place])   Type 2 diabetes with nephropathy ([place])   H/O agent Orange exposure   Discharge Condition: stable and improved. Discharge home with instructions to follow up with PCP and cardiology as an outpatient.  Diet recommendation: heart healthy and modified carbohydrates   Filed Weights   12/22/15 1837 12/23/15 0604 12/24/15 0551  Weight: 99.791 kg (220 lb) 96.843 kg (213 lb 8 oz) 95.2 kg (209 lb 14.1 oz)    History of present illness:  As per H&P written by Dr. Daryll Drown on 12/22/15 70 y.o. 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 Mr. Swing reports that while he was at the eye doctor, getting his eyes checked for glasses, he acutely developed sweating, nausea and felt like he was going to pass out. He did not lose consciousness or having any seizure like twitching per him. The symptoms resolved with time, but while the event was ongoing his blood pressure was checked and found to be 80/50. EMS was called. When EMS came, his blood pressure had bounced back to the [id number] systolic. He reports not eating at all that day as well. The symptoms resolved with time. He is not sure what precipitated it. He has had a stroke in 2014 with residual right hand weakness. He had a TIA in January of this year. Further  symptoms include SOB with aerosols and chronic ringing in the ear. He reports that he had a 30 day holter monitor which was just taken off. However, per review of notes there is a note that says he no-showed for this. Per discharged summary from TIA in January, his stroke presented as a presyncopal episode, but he was supposed to have a holter monitor. When I saw him, his symptoms had completely resolved.   Hospital Course:  Near Syncope - Symptoms seem most likely to be related to not eating or vasovagal due to eye strain. However, he was recently admitted with TIA like symptoms. -after discussing with cardiology and given the fact his holter monitoring was inconclusive; he had placing of loop recorder for further evaluation -no abnormalities seen on telemetry -advise to maintain adequate hydration  HTN -Continue Home meds of coreg, clonidine patch, losartan, spironolactone continued -Monitor closely at follow up and adjust as needed.  H/O TIA/CVA - His neuro exam is non focal at this time except for chronic issues - CT head negative for acute abnormalities  - Do not think this was a neurological event.  - continue risk factor modifications and plavix for secondary prevention  -patient will continue follow up with Dr. Leonie Man as previously instructed   CKD 3 - Cr at baseline, monitor for changes - BMET at follow up recommended   DM2 with retinopathy and neuropathy -Continue SSI and lantus -CBG's stable at 140-170 range  Hypokalemia -repleted and WNL at discharge  Depression -continue celexa  HLD -continue statins  Procedures:  2-D echo: - Left ventricle: The cavity size was normal. There was moderate  concentric hypertrophy. Systolic function was normal. The  estimated ejection fraction was in the range of 55% to 60%. Wall  motion was normal; there were no regional wall motion  abnormalities. Doppler parameters are consistent with abnormal  left  ventricular relaxation (grade 1 diastolic dysfunction).  Doppler parameters are consistent with elevated ventricular  end-diastolic filling pressure. - Aortic valve: Trileaflet; normal thickness leaflets. There was  mild regurgitation. - Aortic root: The aortic root was normal in size. - Ascending aorta: The ascending aorta was mildly dilated measurinh  41 mm. - Mitral valve: Mildly thickened leaflets . - Left atrium: The atrium was moderately dilated. - Right ventricle: Systolic function was normal. - Right atrium: The atrium was normal in size. - Tricuspid valve: There was trivial regurgitation. - Pulmonic valve: There was no regurgitation. - Pulmonary arteries: Systolic pressure was within the normal  range. - Inferior vena cava: The vessel was normal in size. - Pericardium, extracardiac: There was no pericardial effusion.   Loop Recorder insertion 4/21  Consultations:  Cardiology   Discharge Exam: Filed Vitals:   12/24/15 0731 12/24/15 1142  BP: 196/95 172/79  Pulse: 68 61  Temp: 97.7 F (36.5 C) 97.1 F (36.2 C)  Resp: 18 20    General: Afebrile, feeling better and denying any focal neurologic deficit. Patient denies palpitations and CP. No further episodes of lightheadedness   Cardiovascular: S1 and s2, positive SEM, no rubs or gallops  Respiratory: good air movement, no wheezing or crackles on exam  Abdomen: soft, NT, ND, positive BS  Musculoskeletal: no cyanosis, trace edema bilaterally   Discharge Instructions   Discharge Instructions    Discharge instructions    Complete by:  As directed   Take medications as prescribed  Please keep yourself well hydrated Follow a heart healthy diet Check your weight on daily basis Follow with cardiology as instructed Set up follow up with PCP in 10 days     Increase activity slowly    Complete by:  As directed           Current Discharge Medication List    CONTINUE these medications which have NOT  CHANGED   Details  Aspirin-Salicylamide-Caffeine (BC HEADACHE POWDER PO) Take 1 packet by mouth daily as needed (pain).    carvedilol (COREG) 25 MG tablet Take 25 mg by mouth 2 (two) times daily with a meal. Reported on 09/14/2015    fluocinonide cream (LIDEX) [id number] % Apply 1 application topically 2 (two) times daily.    fluticasone (FLONASE) 50 MCG/ACT nasal spray Place 2 sprays into the nose daily as needed for allergies.     gabapentin (NEURONTIN) 300 MG capsule Take 300 mg by mouth daily as needed (leg pain).     Ginkgo Biloba (GNP GINGKO BILOBA EXTRACT PO) Take 1 capsule by mouth daily.    insulin aspart (NOVOLOG) 100 UNIT/ML injection Inject 5-15 Units into the skin 3 (three) times daily with meals. Per sliding scale    insulin glargine (LANTUS) 100 UNIT/ML injection Inject 30 Units into the skin at bedtime.     latanoprost (XALATAN) 0.005 % ophthalmic solution Place 1 drop into both eyes at bedtime.     loratadine (CLARITIN) 10 MG tablet Take 10 mg by mouth daily as needed for allergies.    losartan (COZAAR) 100 MG tablet Take 100 mg by mouth daily.    Multiple Vitamins-Minerals (MULTIVITAMIN PO) Take 1  tablet by mouth daily.    polyvinyl alcohol (LIQUIFILM TEARS) 1.4 % ophthalmic solution Place 1 drop into both eyes 5 (five) times daily as needed (for dry eyes).    simvastatin (ZOCOR) 40 MG tablet Take 40 mg by mouth every evening.    Skin Protectants, Misc. (EUCERIN) cream Apply 1 application topically daily.     spironolactone (ALDACTONE) 25 MG tablet Take 25 mg by mouth daily.    acetaminophen (TYLENOL) 500 MG tablet Take 1 tablet (500 mg total) by mouth every 6 (six) hours as needed for mild pain, moderate pain, fever or headache. Qty: 30 tablet, Refills: 0    albuterol (PROVENTIL HFA;VENTOLIN HFA) 108 (90 BASE) MCG/ACT inhaler Inhale 2 puffs into the lungs every 6 (six) hours as needed for wheezing.    citalopram (CELEXA) 20 MG tablet Take 20 mg by mouth daily.     cloNIDine (CATAPRES - DOSED IN MG/24 HR) 0.3 mg/24hr Place 1 patch onto the skin once a week. No specific day    clopidogrel (PLAVIX) 75 MG tablet Take 1 tablet (75 mg total) by mouth at bedtime.    niacin 100 MG tablet Take 1 tablet (100 mg total) by mouth at bedtime. Qty: 30 tablet, Refills: 0       Allergies  Allergen Reactions  . Metformin And Related Diarrhea   Follow-up Information    Follow up with Dennie Bible, NP. Go on 06/15/2016.   Specialty:  Family Medicine   Why:  You have an appointment for your 6 month follow up with Dr Hassell Done on 06/15/16 at 11 am    Contact information:   [address] [place] [address] [phone]       Schedule an appointment as soon as possible for a visit with [facility]   Why:  As needed   Contact information:   [place] [address] [phone]       Follow up with Thompson Grayer, MD.   Specialty:  Cardiology   Why:  office will contact you with appointment details    Contact information:   [address] [place] [address] [phone]       The results of significant diagnostics from this hospitalization (including imaging, microbiology, ancillary and laboratory) are listed below for reference.    Significant Diagnostic Studies: Dg Chest 2 View  12/22/2015  CLINICAL DATA:  70 year old male with increased weakness, near syncope today. Initial encounter. EXAM: CHEST  2 VIEW COMPARISON:  03/17/2014 and earlier. FINDINGS: Lung volumes remain normal. Stable cardiomegaly and mediastinal contours. Visualized tracheal air column is within normal limits. No pneumothorax, pulmonary edema, pleural effusion or confluent pulmonary opacity. No acute osseous abnormality identified. IMPRESSION: Stable cardiomegaly. No acute cardiopulmonary abnormality. Electronically Signed   By: Genevie Ann M.D.   On: 12/22/2015 12:31   Ct Head Wo Contrast  12/22/2015   CLINICAL DATA:  70 year old male with syncopal episode today. Initial encounter. EXAM: CT HEAD WITHOUT CONTRAST TECHNIQUE: Contiguous axial images were obtained from the base of the skull through the vertex without intravenous contrast. COMPARISON:  Brain MRI and MRA 09/14/2015 and earlier. FINDINGS: Visualized paranasal sinuses and mastoids are clear. Stable scalp soft tissues, no scalp hematoma identified. Stable orbits soft tissues. No acute osseous abnormality identified. Mild Calcified atherosclerosis at the skull base. Mild intracranial artery dolichoectasia. Cerebral volume is within normal limits for age. Scattered dural calcifications. No midline shift, ventriculomegaly, mass effect, evidence of mass lesion, intracranial  hemorrhage or evidence of cortically based acute infarction. Gray-white matter differentiation is within normal limits throughout the brain. IMPRESSION: Stable and negative for age non contrast CT appearance of the brain. Electronically Signed   By: Genevie Ann M.D.   On: 12/22/2015 12:22   Labs: Basic Metabolic Panel:  Recent Labs Lab 12/22/15 1231 12/23/15 0424  NA 142 143  K 3.3* 3.2*  CL 106 106  CO2 30 28  GLUCOSE 139* 136*  BUN 19 21*  CREATININE 1.63* 1.89*  CALCIUM 9.3 9.0   Liver Function Tests:  Recent Labs Lab 12/22/15 1231  AST 27  ALT 22  ALKPHOS 63  BILITOT 0.6  PROT 7.2  ALBUMIN 3.7   CBC:  Recent Labs Lab 12/22/15 1231 12/23/15 0424  WBC 4.1 4.5  NEUTROABS 3.0  --   HGB 12.4* 10.9*  HCT 35.3* 31.3*  MCV 85.5 85.5  PLT 159 156   Cardiac Enzymes:  Recent Labs Lab 12/22/15 1231 12/22/15 1649 12/22/15 2248 12/23/15 0424 12/23/15 1127  TROPONINI 0.04* 0.04* 0.04* 0.03 0.03   CBG:  Recent Labs Lab 12/23/15 1211 12/23/15 1652 12/23/15 2114 12/24/15 0723 12/24/15 1140  GLUCAP 171* 144* 116* 80 223*    Signed:  Barton Dubois MD.  [facility] 12/24/2015, 3:42 PM

## 2015-12-24 NOTE — Consult Note (Signed)
ELECTROPHYSIOLOGY CONSULT NOTE  Patient ID: Cody Silva MRN: [id number], DOB/AGE: 1946-05-03   Admit date: 12/22/2015 Date of Consult: 12/24/2015  Primary Physician: [facility]   Reason for Consultation: Cryptogenic stroke; recommendations regarding Implantable Loop Recorder  History of Present Illness Cody Silva was admitted on 12/22/2015 with presyncope while having an eye procedure.  Though this is felt to likely be vagal in etiology, he has had multiple episodes of abrupt presyncope in the past of unclear etiology.  He has a normal EF by echo.  Prior workup has been unrevealing.  He has worn a 30 day monitor but was unable to wear it for full duration and no arrhythmias were detected.  He also has a prior stroke and TIAs for which he has been evaluated by Dr Leonie Man in the office setting.  . he has been monitored on telemetry which has demonstrated no arrhythmias. No cause has been identified.  EP has been asked to evaluate for placement of an implantable loop recorder to monitor for atrial fibrillation as well as for causes of his presyncope.  Past Medical History Past Medical History  Diagnosis Date  . Hypertension   . Diabetic retinopathy ([place])   . Diabetic neuropathy ([place])   . Colon polyps   . History of blood transfusion 10/30/2014    hematochezia  . Anemia   . OSA on CPAP     "suppose to wear mask; I've got a call in for an equipment change" (10/30/2014)  . Type II diabetes mellitus ([place])   . Headache     "@ least 3 times/wk" (10/30/2014)  . Stroke [facility]) 2014    left extremity deficits; facial left  . Arthritis     "right arm, right ankle, right side" (10/30/2014)  . History of gout   . PTSD (post-traumatic stress disorder)     "service related"    Past Surgical History Past Surgical History  Procedure Laterality Date  . Cataract extraction w/ intraocular lens  implant, bilateral Bilateral 2014-2015  . Tumor removal Right ~ 1976    "arm; had to  take bone left hip to add to the repair"  . Bone graft hip iliac crest Left ~ 1976    Allergies/Intolerances Allergies  Allergen Reactions  . Metformin And Related Diarrhea   Inpatient Medications . [MAR Hold] carvedilol  25 mg Oral BID WC  . [MAR Hold] citalopram  20 mg Oral Daily  . [MAR Hold] cloNIDine  0.3 mg Transdermal Weekly  . [MAR Hold] clopidogrel  75 mg Oral QHS  . [MAR Hold] heparin  5,000 Units Subcutaneous Q8H  . [MAR Hold] insulin aspart  0-5 Units Subcutaneous QHS  . [MAR Hold] insulin aspart  0-9 Units Subcutaneous TID WC  . [MAR Hold] insulin glargine  30 Units Subcutaneous QHS  . [MAR Hold] latanoprost  1 drop Both Eyes QHS  . lidocaine-EPINEPHrine  20 mL Infiltration Once  . [MAR Hold] losartan  100 mg Oral Daily  . [MAR Hold] multivitamin with minerals  1 tablet Oral Daily  . [MAR Hold] simvastatin  40 mg Oral QPM  . [MAR Hold] sodium chloride flush  3 mL Intravenous Q12H  . [MAR Hold] spironolactone  25 mg Oral Daily     Social History Social History   Social History  . Marital Status: Married    Spouse Name: sheila  . Number of Children: 4  . Years of Education: 16   Occupational History  . Not on file.  Social History Main Topics  . Smoking status: Never Smoker   . Smokeless tobacco: Never Used  . Alcohol Use: 0.6 oz/week    0 Standard drinks or equivalent, 1 Cans of beer per week     Comment: 10/30/2014 "might have a beer a couple times/yr"  . Drug Use: No  . Sexual Activity: Yes   Other Topics Concern  . Not on file   Social History Narrative   Patient is married with 4 children   Patient is right handed   Patient has a Water quality scientist degree    Patient 4 cups daily    Review of Systems General: No chills, fever, night sweats or weight changes  Cardiovascular:  No chest pain, dyspnea on exertion, edema, orthopnea, palpitations, paroxysmal nocturnal dyspnea Dermatological: No rash, lesions or masses Respiratory: No cough,  dyspnea Urologic: No hematuria, dysuria Abdominal: No nausea, vomiting, diarrhea, bright red blood per rectum, melena, or hematemesis Neurologic: No visual changes, weakness, changes in mental status All other systems reviewed and are otherwise negative except as noted above.  Physical Exam Blood pressure 196/95, pulse 68, temperature 97.7 F (36.5 C), temperature source Oral, resp. rate 18, height 6\' 1"  (1.854 m), weight 209 lb 14.1 oz (95.2 kg), SpO2 100 %.  General: Well developed, well appearing 70 y.o. male in no acute distress. HEENT: Normocephalic, atraumatic. EOMs intact. Sclera nonicteric. Oropharynx clear.  Neck: Supple without bruits. No JVD. Lungs: Respirations regular and unlabored, CTA bilaterally. No wheezes, rales or rhonchi. Heart: RRR. S1, S2 present. No murmurs, rub, S3 or S4. Abdomen: Soft, non-tender, non-distended. BS present x 4 quadrants. No hepatosplenomegaly.  Extremities: No clubbing, cyanosis or edema. DP/PT/Radials 2+ and equal bilaterally. Psych: Normal affect. Neuro: Alert and oriented X 3. Moves all extremities spontaneously. Musculoskeletal: No kyphosis. Skin: Intact. Warm and dry. No rashes or petechiae in exposed areas.   Labs Lab Results  Component Value Date   WBC 4.5 12/23/2015   HGB 10.9* 12/23/2015   HCT 31.3* 12/23/2015   MCV 85.5 12/23/2015   PLT 156 12/23/2015    Recent Labs Lab 12/22/15 1231 12/23/15 0424  NA 142 143  K 3.3* 3.2*  CL 106 106  CO2 30 28  BUN 19 21*  CREATININE 1.63* 1.89*  CALCIUM 9.3 9.0  PROT 7.2  --   BILITOT 0.6  --   ALKPHOS 63  --   ALT 22  --   AST 27  --   GLUCOSE 139* 136*   No results for input(s): INR in the last 72 hours.  Radiology/Studies Dg Chest 2 View  12/22/2015  CLINICAL DATA:  70 year old male with increased weakness, near syncope today. Initial encounter. EXAM: CHEST  2 VIEW COMPARISON:  03/17/2014 and earlier. FINDINGS: Lung volumes remain normal. Stable cardiomegaly and mediastinal  contours. Visualized tracheal air column is within normal limits. No pneumothorax, pulmonary edema, pleural effusion or confluent pulmonary opacity. No acute osseous abnormality identified. IMPRESSION: Stable cardiomegaly. No acute cardiopulmonary abnormality. Electronically Signed   By: Genevie Ann M.D.   On: 12/22/2015 12:31   Ct Head Wo Contrast  12/22/2015  CLINICAL DATA:  70 year old male with syncopal episode today. Initial encounter. EXAM: CT HEAD WITHOUT CONTRAST TECHNIQUE: Contiguous axial images were obtained from the base of the skull through the vertex without intravenous contrast. COMPARISON:  Brain MRI and MRA 09/14/2015 and earlier. FINDINGS: Visualized paranasal sinuses and mastoids are clear. Stable scalp soft tissues, no scalp hematoma identified. Stable orbits soft tissues. No acute osseous abnormality  identified. Mild Calcified atherosclerosis at the skull base. Mild intracranial artery dolichoectasia. Cerebral volume is within normal limits for age. Scattered dural calcifications. No midline shift, ventriculomegaly, mass effect, evidence of mass lesion, intracranial hemorrhage or evidence of cortically based acute infarction. Gray-white matter differentiation is within normal limits throughout the brain. IMPRESSION: Stable and negative for age non contrast CT appearance of the brain. Electronically Signed   By: Genevie Ann M.D.   On: 12/22/2015 12:22    Echocardiogram  Reviewed, EF normal  12-lead ECG all ekgs in epic are reviewed, no afib  Telemetry no arrhythmia   Assessment and Plan 1. Cryptogenic stroke/ TIA/ presyncope  I recommend loop recorder insertion to monitor for AF as well as to further evaluate presyncope. The indication for loop recorder insertion / monitoring for AF in setting of cryptogenic stroke was discussed with the patient. The loop recorder insertion procedure was reviewed in detail including risks and benefits. These risks include but are not limited to bleeding  and infection. The patient expressed verbal understanding and agrees to proceed. The patient was also counseled regarding wound care and device follow-up.  No further inpatient EP/ cardiology workup planned post implant. Ok to discharge from my standpoint  Follow-up to be arranged in EP device clinic for wound check.  Army Fossa 12/24/2015, 9:28 AM

## 2015-12-24 NOTE — Interval H&P Note (Signed)
History and Physical Interval Note:  12/24/2015 9:34 AM  Cody Silva  has presented today for surgery, with the diagnosis of syncope  The various methods of treatment have been discussed with the patient and family. After consideration of risks, benefits and other options for treatment, the patient has consented to  Procedure(s): Loop Recorder Insertion (N/A) as a surgical intervention .  The patient's history has been reviewed, patient examined, no change in status, stable for surgery.  I have reviewed the patient's chart and labs.  Questions were answered to the patient's satisfaction.     Thompson Grayer

## 2015-12-30 NOTE — Telephone Encounter (Signed)
Pt has not return call about form from [place]. Rn mail the letter about to patient about his walking ability and not be able to walk up and down stairs. Pt turn in a diabetic form to be filled. RN left message that his PCP or diabetic md will have to filled that form out. The diabetic form was mail back to patient. The other form is not clear on what the MD for the MD. Rn will continue to contact patient.Cody Silva

## 2016-01-03 ENCOUNTER — Ambulatory Visit (INDEPENDENT_AMBULATORY_CARE_PROVIDER_SITE_OTHER): Payer: Medicare Other | Admitting: *Deleted

## 2016-01-03 ENCOUNTER — Encounter: Payer: Self-pay | Admitting: Internal Medicine

## 2016-01-03 DIAGNOSIS — Z95818 Presence of other cardiac implants and grafts: Secondary | ICD-10-CM

## 2016-01-03 LAB — CUP PACEART INCLINIC DEVICE CHECK: MDC IDC SESS DTM: 20170501173957

## 2016-01-03 NOTE — Progress Notes (Signed)
ILR wound check appointment. Steri-strips previously removed by patient. Wound without redness or edema. Incision edges approximated, wound well healed.   Battery status: good. R-waves 0.30mV. No symptom, tachy, pause, brady, or AF episodes. Updated patient's MRN in device. Patient instructed on use of symptom activator. Monthly summary reports and ROV with JA PRN.

## 2016-01-04 ENCOUNTER — Encounter: Payer: Self-pay | Admitting: Occupational Therapy

## 2016-01-04 ENCOUNTER — Ambulatory Visit: Payer: Medicare Other | Attending: Internal Medicine | Admitting: Occupational Therapy

## 2016-01-04 DIAGNOSIS — M79621 Pain in right upper arm: Secondary | ICD-10-CM | POA: Diagnosis present

## 2016-01-04 DIAGNOSIS — R278 Other lack of coordination: Secondary | ICD-10-CM | POA: Insufficient documentation

## 2016-01-04 DIAGNOSIS — M25511 Pain in right shoulder: Secondary | ICD-10-CM | POA: Diagnosis present

## 2016-01-04 DIAGNOSIS — I69351 Hemiplegia and hemiparesis following cerebral infarction affecting right dominant side: Secondary | ICD-10-CM | POA: Diagnosis present

## 2016-01-04 DIAGNOSIS — R29818 Other symptoms and signs involving the nervous system: Secondary | ICD-10-CM

## 2016-01-04 DIAGNOSIS — M6281 Muscle weakness (generalized): Secondary | ICD-10-CM | POA: Diagnosis present

## 2016-01-04 NOTE — Therapy (Signed)
[facility] [address] [place], [place], [address] Phone: [phone]   Fax:  [phone]  Occupational Therapy Evaluation  Patient Details  Name: Cody Silva MRN: [id number] Date of Birth: February 16, 1946 Referring Provider: Dr. Antony Contras  Encounter Date: 01/04/2016      OT End of Session - 01/04/16 1421    Visit Number 1   Number of Visits 13   Date for OT Re-Evaluation 02/15/16   Authorization Type UHC MCR/TRICARE - G CODE NEEDED   OT Start Time 1105   OT Stop Time 1155   OT Time Calculation (min) 50 min   Activity Tolerance Patient tolerated treatment well      Past Medical History  Diagnosis Date  . Hypertension   . Diabetic retinopathy ([place])   . Diabetic neuropathy ([place])   . Colon polyps   . History of blood transfusion 10/30/2014    hematochezia  . Anemia   . OSA on CPAP     "suppose to wear mask; I've got a call in for an equipment change" (10/30/2014)  . Type II diabetes mellitus ([place])   . Headache     "@ least 3 times/wk" (10/30/2014)  . Stroke [facility]) 2014    left extremity deficits; facial left  . Arthritis     "right arm, right ankle, right side" (10/30/2014)  . History of gout   . PTSD (post-traumatic stress disorder)     "service related"    Past Surgical History  Procedure Laterality Date  . Cataract extraction w/ intraocular lens  implant, bilateral Bilateral 2014-2015  . Tumor removal Right ~ 1976    "arm; had to take bone left hip to add to the repair"  . Bone graft hip iliac crest Left ~ 1976  . Ep implantable device N/A 12/24/2015    Procedure: Loop Recorder Insertion;  Surgeon: Thompson Grayer, MD;  Location: [facility];  Service: Cardiovascular;  Laterality: N/A;    There were no vitals filed for this visit.      Subjective Assessment - 01/04/16 1112    Subjective  I drop a lot from my Rt hand   Pertinent History TIA 1/17, CVA 2014   Limitations loop recorder   Patient Stated Goals To write better and get my Rt hand/arm stronger   Currently in Pain? Yes   Pain Score 8    Pain Location Head  to scapula   Pain Orientation Right   Pain Type Chronic pain   Pain Radiating Towards scapula and Rt shoulder   Pain Onset More than a month ago   Pain Frequency Constant   Aggravating Factors  lifting arm   Pain Relieving Factors injection has helped shoulder, pain meds           [facility] OT Assessment - 01/04/16 0001    Assessment   Diagnosis Rt hand weakness    Referring Provider Dr. Antony Contras   Onset Date --  since June 2014 from CVA, but reports has gotten worse   Assessment Pt reports compression at cervical spine and reports may need surgery which may be causing worsening hand symptoms   Prior Therapy outpatient P.T.    Precautions   Precautions Fall  loop recorder    Restrictions   Weight Bearing Restrictions No   Balance Screen   Has the patient fallen in the past 6 months Yes   How many times? 1  but reports LOB multiple times   Has the  patient had a decrease in activity level because of a fear of falling?  Yes   Is the patient reluctant to leave their home because of a fear of falling?  No   Home  Environment   Writer;Door   Additional Comments Pt lives in 2 story home with bedroom/bathroom upstairs. 2 steps to enter hourse. DME: SPC, BSC   Lives With Spouse;Daughter   Prior Function   Level of Independence Independent with basic ADLs;Independent with gait   Vocation Retired   ADL   Eating/Feeding Modified independent  sometimes having to use Lt hand   Grooming Modified independent   Upper Body Bathing Modified independent   Lower Body Bathing Modified independent   Upper Body Dressing Minimal assistance  for buttons on cuff and top of shirt   Lower Body Dressing Minimal assistance  for tying shoes & difficulty donning socks   Toilet Tranfer Independent   Toileting - Doctor, general practice -  Development worker, community Supervision/safety   IADL   Prior Level of Function Shopping wife performs mostly   Fish farm manager independently for small purchases   Prior Level of Function Light Housekeeping wife performs   Meal Prep --  wife performs mostly   Programmer, applications own vehicle   Medication Management Has difficulty remembering to take medication   Financial Management Requires assistance  d/t vision   Mobility   Mobility Status Independent   Mobility Status Comments will use cane prn   Written Expression   Dominant Hand Right   Handwriting 75% legible   Vision - History   Baseline Vision Wears glasses only for reading   Visual History Glaucome  and diabetic retinopathy   Additional Comments Lt eye legally blind   Cognition   Overall Cognitive Status Cognition to be further assessed in functional context PRN  Pt reports decreased memory   Observation/Other Assessments   Observations Pt with noted atrophy in Rt hand and ulnar n. deficits (? whether from CVA or cervical compression). Pt also had old injury to elbow with nerve transposition    Sensation   Light Touch Appears Intact   Additional Comments Pt reports occasional numbness (possibly from diabetic neuropathy)   Coordination   9 Hole Peg Test Right;Left   Right 9 Hole Peg Test 39.00 sec   Left 9 Hole Peg Test 41.19 sec   Edema   Edema both feet Lt > Rt   AROM   Overall AROM Comments BUE AROM WFL's with pain RUE during shoulder movement   Hand Function   Right Hand Grip (lbs) 32 lbs   Right Hand Lateral Pinch 8 lbs   Right Hand 3 Point Pinch 4 lbs   Left Hand Grip (lbs) 47 lbs   Left Hand Lateral Pinch 11 lbs   Left 3 point pinch 10 lbs                              OT Long Term Goals - 01/04/16 1427    OT LONG TERM GOAL #1   Title Independent with HEP for coordination and hand strengthening (all goals due 02/15/16)   Time 6    Period Weeks   Status New   OT LONG TERM GOAL #2   Title Pt to report greater ease and independence with hooking buttons and tying shoes with A/E prn   Time 6   Period  Weeks   Status New   OT LONG TERM GOAL #3   Title Pt to report increased ease and 90% legibility with writing using A/E prn   Time 6   Period Weeks   Status New   OT LONG TERM GOAL #4   Title Improve grip strength Rt hand by 5 lbs or greater for opening containers/jars   Baseline eval = 32 lbs (Lt = 47 lbs)   Time 6   Period Weeks   Status New   OT LONG TERM GOAL #5   Title Improve coordination as evidenced by reducing speed on 9 hole peg test bilateral hands by 5 sec.    Baseline Rt = 39 sec. Lt = 41.19 sec.    Time 6   Period Weeks   Status New   Long Term Additional Goals   Additional Long Term Goals Yes   OT LONG TERM GOAL #6   Title Pt to verbalize understanding with pain management strategies for Rt shoulder/scapula including: proper positioning, stretches, and taping prn   Time 6   Period Weeks   Status New               Plan - 01/04/16 1423    Clinical Impression Statement Pt is a 70 y.o. male who presents to outpatient rehab s/p TIA in January 2017. Pt also has had CVA in 2014 affecting Rt dominant side. Pt with noted muscle atrophy in Rt hand and uncertain if this is from CVA or cervical spine compression. Pt also has pain from cervical spine to RT scapula and shoulder. Pt will benefit from O.T. to address below deficits   Rehab Potential Good   OT Frequency 2x / week   OT Duration 6 weeks  PLUS EVAL   OT Treatment/Interventions Self-care/ADL training;Functional Mobility Training;Patient/family education;Neuromuscular education;Splinting;Manual Therapy;Therapeutic exercises;DME and/or AE instruction;Therapeutic activities;Passive range of motion;Moist Heat;Visual/perceptual remediation/compensation;Electrical Stimulation   Plan coordination and putty HEP    Consulted and Agree with Plan of  Care Patient      Patient will benefit from skilled therapeutic intervention in order to improve the following deficits and impairments:  Decreased coordination, Impaired flexibility, Impaired sensation, Decreased activity tolerance, Impaired UE functional use, Pain, Decreased strength, Impaired vision/preception, Decreased balance  Visit Diagnosis: Other lack of coordination - Plan: Ot plan of care cert/re-cert  Muscle weakness (generalized) - Plan: Ot plan of care cert/re-cert  Other symptoms and signs involving the nervous system - Plan: Ot plan of care cert/re-cert  Pain in right shoulder - Plan: Ot plan of care cert/re-cert  Pain in right upper arm - Plan: Ot plan of care cert/re-cert  Hemiplegia and hemiparesis following cerebral infarction affecting right dominant side ([place]) - Plan: Ot plan of care cert/re-cert      G-Codes - [id number]    Functional Assessment Tool Used RUE: grip 32 lbs, 9 hole peg 39 sec., pain up to 8/10   Functional Limitation Carrying, moving and handling objects   Carrying, Moving and Handling Objects Current Status [phone]) At least 40 percent but less than 60 percent impaired, limited or restricted   Carrying, Moving and Handling Objects Goal Status [id number]) At least 20 percent but less than 40 percent impaired, limited or restricted      Problem List Patient Active Problem List   Diagnosis Date Noted  . H/O agent Orange exposure 12/24/2015  . Type 2 diabetes with nephropathy ([place])   . Near syncope 12/22/2015  . History of TIAs 09/14/2015  .  CKD stage 3 due to type 2 diabetes mellitus ([place]) 09/14/2015  . Acute lower GI bleeding 10/30/2014  . Orthostatic hypotension 10/30/2014  . History of colonic polyps 10/30/2014  . Depression 10/30/2014  . Symptomatic anemia 10/30/2014  . AKI (acute kidney injury) ([place]) 10/30/2014  . Ataxic gait 09/21/2014  . History of CVA (cerebrovascular accident) 02/22/2013  . Abnormal brain scan 02/21/2013  .  Dizziness 02/21/2013  . Weakness 02/21/2013  . Diabetes mellitus ([place]) 02/21/2013  . Hypertension 02/21/2013    Carey Bullocks, OTR/L 01/04/2016, 2:37 PM  [facility] [address] [place], [place], [address] Phone: [phone]   Fax:  [phone]  Name: Cody Silva MRN: [id number] Date of Birth: [birth year]

## 2016-01-10 ENCOUNTER — Ambulatory Visit: Payer: Medicare Other | Admitting: Occupational Therapy

## 2016-01-11 ENCOUNTER — Ambulatory Visit: Payer: Medicare Other | Admitting: Occupational Therapy

## 2016-01-11 ENCOUNTER — Encounter: Payer: Self-pay | Admitting: Occupational Therapy

## 2016-01-11 DIAGNOSIS — R278 Other lack of coordination: Secondary | ICD-10-CM | POA: Diagnosis not present

## 2016-01-11 DIAGNOSIS — M6281 Muscle weakness (generalized): Secondary | ICD-10-CM

## 2016-01-11 DIAGNOSIS — R29818 Other symptoms and signs involving the nervous system: Secondary | ICD-10-CM

## 2016-01-11 NOTE — Therapy (Signed)
[facility] [address] [place], [place], [address] Phone: [phone]   Fax:  [phone]  Occupational Therapy Treatment  Patient Details  Name: [name] MRN: [id number] Date of Birth: [birth year] Referring Provider: Dr. Antony Contras  Encounter Date: 01/11/2016      OT End of Session - 01/11/16 1415    Visit Number 2   Number of Visits 13   Date for OT Re-Evaluation 02/15/16   Authorization Type UHC MCR/TRICARE - G CODE NEEDED   Authorization - Visit Number 2   Authorization - Number of Visits 10   OT Start Time [id number]   OT Stop Time 1400   OT Time Calculation (min) 45 min   Activity Tolerance Patient tolerated treatment well      Past Medical History  Diagnosis Date  . Hypertension   . Diabetic retinopathy ([place])   . Diabetic neuropathy ([place])   . Colon polyps   . History of blood transfusion 10/30/2014    hematochezia  . Anemia   . OSA on CPAP     "suppose to wear mask; I've got a call in for an equipment change" (10/30/2014)  . Type II diabetes mellitus ([place])   . Headache     "@ least 3 times/wk" (10/30/2014)  . Stroke [facility]) 2014    left extremity deficits; facial left  . Arthritis     "right arm, right ankle, right side" (10/30/2014)  . History of gout   . PTSD (post-traumatic stress disorder)     "service related"    Past Surgical History  Procedure Laterality Date  . Cataract extraction w/ intraocular lens  implant, bilateral Bilateral 2014-2015  . Tumor removal Right ~ 1976    "arm; had to take bone left hip to add to the repair"  . Bone graft hip iliac crest Left ~ 1976  . Ep implantable device N/A 12/24/2015    Procedure: Loop Recorder Insertion;  Surgeon: Thompson Grayer, MD;  Location: [facility];  Service: Cardiovascular;  Laterality: N/A;    There were no vitals filed for this visit.      Subjective Assessment - 01/11/16 1320    Subjective  I fell last week    Pertinent  History TIA 1/17, CVA 2014   Limitations loop recorder   Patient Stated Goals To write better and get my Rt hand/arm stronger   Currently in Pain? Yes   Pain Score 5    Pain Location --  head to rt shoulder   Pain Orientation Right   Pain Descriptors / Indicators Headache;Aching   Pain Type Chronic pain   Pain Onset More than a month ago   Pain Frequency Constant   Aggravating Factors  rainy/cold weather   Pain Relieving Factors meds, heat            [facility] OT Assessment - 01/11/16 0001    Observation/Other Assessments   Observations Pt with difficulty to unable to perform intrinsic + and finger abd/add movements RT hand consistent with ulnar n. damage. However, also beginning to show ulnar n. deficits in Lt hand                  OT Treatments/Exercises (OP) - 01/11/16 0001    Exercises   Exercises Hand   Hand Exercises   Other Hand Exercises see A/ROM and putty HEP. Issued red putty   Fine Motor Coordination   Other Fine Motor Exercises See coordination HEP  Modalities   Modalities Moist Heat   Moist Heat Therapy   Number Minutes Moist Heat 15 Minutes  while simultaneously instructing in HEP    Moist Heat Location Shoulder  Rt                OT Education - 01/11/16 1409    Education provided Yes   Education Details coordination, A/ROM and putty HEP for both hands (Rt hand emphasis) with focus on ulnar n. movements (finger abd/add, lumbricals intrinsic + position)   Person(s) Educated Patient   Methods Explanation;Demonstration;Handout   Comprehension Verbalized understanding;Returned demonstration             OT Long Term Goals - 01/04/16 1427    OT LONG TERM GOAL #1   Title Independent with HEP for coordination and hand strengthening (all goals due 02/15/16)   Time 6   Period Weeks   Status New   OT LONG TERM GOAL #2   Title Pt to report greater ease and independence with hooking buttons and tying shoes with A/E prn   Time 6    Period Weeks   Status New   OT LONG TERM GOAL #3   Title Pt to report increased ease and 90% legibility with writing using A/E prn   Time 6   Period Weeks   Status New   OT LONG TERM GOAL #4   Title Improve grip strength Rt hand by 5 lbs or greater for opening containers/jars   Baseline eval = 32 lbs (Lt = 47 lbs)   Time 6   Period Weeks   Status New   OT LONG TERM GOAL #5   Title Improve coordination as evidenced by reducing speed on 9 hole peg test bilateral hands by 5 sec.    Baseline Rt = 39 sec. Lt = 41.19 sec.    Time 6   Period Weeks   Status New   Long Term Additional Goals   Additional Long Term Goals Yes   OT LONG TERM GOAL #6   Title Pt to verbalize understanding with pain management strategies for Rt shoulder/scapula including: proper positioning, stretches, and taping prn   Time 6   Period Weeks   Status New               Plan - 01/11/16 1413    Clinical Impression Statement Pt approximating LTG #1. Pt with ulnar n. damage Rt hand which could be from old elbow injury with nerve transposition but also noted emerging deficits in Lt hand as well, which may be a result of cervical nerve compression   Rehab Potential Good   OT Frequency 2x / week   OT Duration 6 weeks   OT Treatment/Interventions Self-care/ADL training;Functional Mobility Training;Patient/family education;Neuromuscular education;Splinting;Manual Therapy;Therapeutic exercises;DME and/or AE instruction;Therapeutic activities;Passive range of motion;Moist Heat;Visual/perceptual remediation/compensation;Electrical Stimulation   Plan review HEP, show adaptations and/or A/E for hooking buttons, tying shoes, donning socks, cutting food, writing   Consulted and Agree with Plan of Care Patient      Patient will benefit from skilled therapeutic intervention in order to improve the following deficits and impairments:  Decreased coordination, Impaired flexibility, Impaired sensation, Decreased activity  tolerance, Impaired UE functional use, Pain, Decreased strength, Impaired vision/preception, Decreased balance  Visit Diagnosis: Other lack of coordination  Muscle weakness (generalized)  Other symptoms and signs involving the nervous system    Problem List Patient Active Problem List   Diagnosis Date Noted  . H/O agent Orange exposure 12/24/2015  .  Type 2 diabetes with nephropathy ([place])   . Near syncope 12/22/2015  . History of TIAs 09/14/2015  . CKD stage 3 due to type 2 diabetes mellitus ([place]) 09/14/2015  . Acute lower GI bleeding 10/30/2014  . Orthostatic hypotension 10/30/2014  . History of colonic polyps 10/30/2014  . Depression 10/30/2014  . Symptomatic anemia 10/30/2014  . AKI (acute kidney injury) ([place]) 10/30/2014  . Ataxic gait 09/21/2014  . History of CVA (cerebrovascular accident) 02/22/2013  . Abnormal brain scan 02/21/2013  . Dizziness 02/21/2013  . Weakness 02/21/2013  . Diabetes mellitus ([place]) 02/21/2013  . Hypertension 02/21/2013    Carey Bullocks, OTR/L 01/11/2016, 2:20 PM  [facility] [address] [place], [place], [address] Phone: [phone]   Fax:  [phone]  Name: Cody Silva MRN: [id number] Date of Birth: Nov 05, 1945

## 2016-01-11 NOTE — Telephone Encounter (Signed)
Patient came into office today about form for [place]. Pt stated he was in the hospital recently and just need his stroke aliments on the form. He stated Dr. Leonie Man does not need to put the disability coverage. He stated only the form needs to be filled out concerning he had a stroke and his balancing issues.

## 2016-01-11 NOTE — Patient Instructions (Signed)
  Coordination Activities  Perform the following activities for 15 minutes 1-2 times per day with both hand(s).   Rotate ball in fingertips (clockwise and counter-clockwise).  Flip cards 1 at a time as fast as you can.  Deal cards with your thumb (Hold deck in hand and push card off top with thumb).  Rotate card in hand (clockwise and counter-clockwise).  Pick up coins one at a time until you get 5-10 in your hand, then move coins from palm to fingertips to stack one at a time.     MP Flexion (Active)   With back of hand on table, bend large knuckles as far as they will go, keeping small joints straight. Repeat _10-15___ times. Do __3_ sessions per day. Activity: Reach into a narrow container.*    Abduction / Adduction (Active)    With hand flat on table, spread all fingers apart, then bring them together as close as possible. Repeat _10___ times. Do __3__ sessions per day.    1. Grip Strengthening (Resistive Putty)   Squeeze putty using thumb and all fingers Rt hand.  Repeat _20___ times. Do __2__ sessions per day.   2. MP Flexion (Resistive Putty)    Bending only at large knuckles, press putty down against thumb. Keep fingertips STRAIGHT. Pull apart with both hands ("duck bill" position)  Repeat __10__ times. Do ____ sessions per day.   Adduction (Resistive Putty)    Press putty between 2 fingers like scissors.  Repeat with all fingers. Repeat __10_ times. Do ___2_ sessions per day.  Copyright  VHI. All rights reserved.

## 2016-01-12 NOTE — Telephone Encounter (Signed)
Form completed for patient for the [place]. Pt will pick up before Friday.

## 2016-01-13 ENCOUNTER — Ambulatory Visit: Payer: Medicare Other | Admitting: Occupational Therapy

## 2016-01-13 ENCOUNTER — Encounter: Payer: Self-pay | Admitting: Occupational Therapy

## 2016-01-13 DIAGNOSIS — R278 Other lack of coordination: Secondary | ICD-10-CM | POA: Diagnosis not present

## 2016-01-13 DIAGNOSIS — M6281 Muscle weakness (generalized): Secondary | ICD-10-CM

## 2016-01-13 DIAGNOSIS — R29818 Other symptoms and signs involving the nervous system: Secondary | ICD-10-CM

## 2016-01-13 NOTE — Therapy (Signed)
[facility] [address] [place], [place], [address] Phone: [phone]   Fax:  [phone]  Occupational Therapy Treatment  Patient Details  Name: Cody Silva MRN: [id number] Date of Birth: [birth year] Referring Provider: Dr. Antony Contras  Encounter Date: 01/13/2016      OT End of Session - 01/13/16 1157    Visit Number 3   Number of Visits 13   Date for OT Re-Evaluation 02/15/16   Authorization Type UHC MCR/TRICARE - G CODE NEEDED   Authorization - Visit Number 3   Authorization - Number of Visits 10   OT Start Time 1100   OT Stop Time 1145   OT Time Calculation (min) 45 min   Activity Tolerance Patient tolerated treatment well      Past Medical History  Diagnosis Date  . Hypertension   . Diabetic retinopathy ([place])   . Diabetic neuropathy ([place])   . Colon polyps   . History of blood transfusion 10/30/2014    hematochezia  . Anemia   . OSA on CPAP     "suppose to wear mask; I've got a call in for an equipment change" (10/30/2014)  . Type II diabetes mellitus ([place])   . Headache     "@ least 3 times/wk" (10/30/2014)  . Stroke [facility]) 2014    left extremity deficits; facial left  . Arthritis     "right arm, right ankle, right side" (10/30/2014)  . History of gout   . PTSD (post-traumatic stress disorder)     "service related"    Past Surgical History  Procedure Laterality Date  . Cataract extraction w/ intraocular lens  implant, bilateral Bilateral 2014-2015  . Tumor removal Right ~ 1976    "arm; had to take bone left hip to add to the repair"  . Bone graft hip iliac crest Left ~ 1976  . Ep implantable device N/A 12/24/2015    Procedure: Loop Recorder Insertion;  Surgeon: Thompson Grayer, MD;  Location: [facility];  Service: Cardiovascular;  Laterality: N/A;    There were no vitals filed for this visit.      Subjective Assessment - 01/13/16 1108    Pertinent History TIA 1/17, CVA 2014   Limitations loop recorder   Patient Stated Goals To write better and get my Rt hand/arm stronger   Currently in Pain? Yes   Pain Score 5    Pain Location --  head to Rt shoulder   Pain Orientation Right   Pain Descriptors / Indicators Aching;Headache   Pain Type Chronic pain   Pain Radiating Towards scapula to Rt shoulder   Pain Onset More than a month ago   Pain Frequency Constant   Aggravating Factors  rainy/cold weather   Pain Relieving Factors meds, heat                      OT Treatments/Exercises (OP) - 01/13/16 0001    ADLs   ADL Comments Pt practiced use of various pens, rocker knife, button hook. Pt also shown compression stocking donner and various options for tying shoes. Pt provided handouts and told how/where to purchase. Pt issued foam for pen for greater ease. (Pt most benefited from rocker knife and anticipate stocking donner). Pt also given alternative methods to button up shirts (using velcro, donning like pull over shirt)    Hand Exercises   Other Hand Exercises Reviewed coordination, ROM, and putty HEP previously issued  OT Education - 01/13/16 1156    Education provided Yes   Education Details review of HEP, A/E recommendations   Person(s) Educated Patient   Methods Explanation;Demonstration;Handout   Comprehension Verbalized understanding;Returned demonstration             OT Long Term Goals - 01/13/16 1157    OT LONG TERM GOAL #1   Title Independent with HEP for coordination and hand strengthening (all goals due 02/15/16)   Time 6   Period Weeks   Status Achieved   OT LONG TERM GOAL #2   Title Pt to report greater ease and independence with hooking buttons and tying shoes with A/E prn   Time 6   Period Weeks   Status On-going   OT LONG TERM GOAL #3   Title Pt to report increased ease and 90% legibility with writing using A/E prn   Time 6   Period Weeks   Status On-going   OT LONG TERM GOAL #4   Title  Improve grip strength Rt hand by 5 lbs or greater for opening containers/jars   Baseline eval = 32 lbs (Lt = 47 lbs)   Time 6   Period Weeks   Status New   OT LONG TERM GOAL #5   Title Improve coordination as evidenced by reducing speed on 9 hole peg test bilateral hands by 5 sec.    Baseline Rt = 39 sec. Lt = 41.19 sec.    Time 6   Period Weeks   Status New   OT LONG TERM GOAL #6   Title Pt to verbalize understanding with pain management strategies for Rt shoulder/scapula including: proper positioning, stretches, and taping prn   Time 6   Period Weeks   Status New               Plan - 01/13/16 1157    Clinical Impression Statement Pt met LTG #1 and approximating LTG #2 and #3.    OT Frequency 2x / week   OT Duration 6 weeks   OT Treatment/Interventions Self-care/ADL training;Functional Mobility Training;Patient/family education;Neuromuscular education;Splinting;Manual Therapy;Therapeutic exercises;DME and/or AE instruction;Therapeutic activities;Passive range of motion;Moist Heat;Visual/perceptual remediation/compensation;Electrical Stimulation   Plan Pt to bring in compression stocking and practice donning with A/E. Continue to work coordination, grip strength. Discuss pain management strategies for sh./neck   Consulted and Agree with Plan of Care Patient      Patient will benefit from skilled therapeutic intervention in order to improve the following deficits and impairments:  Decreased coordination, Impaired flexibility, Impaired sensation, Decreased activity tolerance, Impaired UE functional use, Pain, Decreased strength, Impaired vision/preception, Decreased balance  Visit Diagnosis: Other lack of coordination  Muscle weakness (generalized)  Other symptoms and signs involving the nervous system    Problem List Patient Active Problem List   Diagnosis Date Noted  . H/O agent Orange exposure 12/24/2015  . Type 2 diabetes with nephropathy ([place])   . Near syncope  12/22/2015  . History of TIAs 09/14/2015  . CKD stage 3 due to type 2 diabetes mellitus ([place]) 09/14/2015  . Acute lower GI bleeding 10/30/2014  . Orthostatic hypotension 10/30/2014  . History of colonic polyps 10/30/2014  . Depression 10/30/2014  . Symptomatic anemia 10/30/2014  . AKI (acute kidney injury) ([place]) 10/30/2014  . Ataxic gait 09/21/2014  . History of CVA (cerebrovascular accident) 02/22/2013  . Abnormal brain scan 02/21/2013  . Dizziness 02/21/2013  . Weakness 02/21/2013  . Diabetes mellitus ([place]) 02/21/2013  . Hypertension 02/21/2013  Carey Bullocks, OTR/L 01/13/2016, 11:59 AM  [facility] [address] [place], [place], [address] Phone: [phone]   Fax:  [phone]  Name: Cody Silva MRN: [id number] Date of Birth: [birth year]

## 2016-01-18 ENCOUNTER — Ambulatory Visit: Payer: Medicare Other | Admitting: Occupational Therapy

## 2016-01-18 ENCOUNTER — Encounter: Payer: Self-pay | Admitting: Occupational Therapy

## 2016-01-18 DIAGNOSIS — R278 Other lack of coordination: Secondary | ICD-10-CM | POA: Diagnosis not present

## 2016-01-18 DIAGNOSIS — M25511 Pain in right shoulder: Secondary | ICD-10-CM

## 2016-01-18 DIAGNOSIS — I69351 Hemiplegia and hemiparesis following cerebral infarction affecting right dominant side: Secondary | ICD-10-CM

## 2016-01-18 DIAGNOSIS — M6281 Muscle weakness (generalized): Secondary | ICD-10-CM

## 2016-01-18 NOTE — Therapy (Signed)
[facility] [address] [place], [place], [address] Phone: [phone]   Fax:  [phone]  Occupational Therapy Treatment  Patient Details  Name: Cody Silva MRN: [id number] Date of Birth: [birth year] Referring Provider: Dr. Antony Contras  Encounter Date: 01/18/2016      OT End of Session - 01/18/16 1310    Visit Number 4   Number of Visits 13   Date for OT Re-Evaluation 02/15/16   Authorization Type UHC MCR/TRICARE - G CODE NEEDED   Authorization - Visit Number 4   Authorization - Number of Visits 10   OT Start Time 1200  Pt arrived 15 min. late   OT Stop Time 1230   OT Time Calculation (min) 30 min   Activity Tolerance Patient tolerated treatment well      Past Medical History  Diagnosis Date  . Hypertension   . Diabetic retinopathy ([place])   . Diabetic neuropathy ([place])   . Colon polyps   . History of blood transfusion 10/30/2014    hematochezia  . Anemia   . OSA on CPAP     "suppose to wear mask; I've got a call in for an equipment change" (10/30/2014)  . Type II diabetes mellitus ([place])   . Headache     "@ least 3 times/wk" (10/30/2014)  . Stroke [facility]) 2014    left extremity deficits; facial left  . Arthritis     "right arm, right ankle, right side" (10/30/2014)  . History of gout   . PTSD (post-traumatic stress disorder)     "service related"    Past Surgical History  Procedure Laterality Date  . Cataract extraction w/ intraocular lens  implant, bilateral Bilateral 2014-2015  . Tumor removal Right ~ 1976    "arm; had to take bone left hip to add to the repair"  . Bone graft hip iliac crest Left ~ 1976  . Ep implantable device N/A 12/24/2015    Procedure: Loop Recorder Insertion;  Surgeon: Thompson Grayer, MD;  Location: [facility];  Service: Cardiovascular;  Laterality: N/A;    There were no vitals filed for this visit.      Subjective Assessment - 01/18/16 1204    Subjective  My  Lt leg is swollen today   Pertinent History TIA 1/17, CVA 2014   Limitations loop recorder   Patient Stated Goals To write better and get my Rt hand/arm stronger   Pain Score 6    Pain Location --  head to Rt shoulder   Pain Orientation Right   Pain Descriptors / Indicators Aching;Headache   Pain Type Chronic pain   Pain Onset More than a month ago   Pain Frequency Constant   Aggravating Factors  rainy/cold weather   Pain Relieving Factors meds, heat            [facility] OT Assessment - 01/18/16 0001    Observation/Other Assessments   Observations Assessed Rt scapula movement - pt appears to have movement WFL's although slightly delayed scapula elevation. Possibly slightly hypermobile with sh. abduction. Suspect his chronic pain is more orthopedic in nature (? d/t cervical compression)                   OT Treatments/Exercises (OP) - 01/18/16 0001    ADLs   ADL Comments Pt was assessed by P.T. for LLE edema/swelling at lower calf and foot. Also noted skin shiny in appearance. Pt currently on blood thinner and sees MD tomorrow.  Pt also reports it has been like this for 3 weeks. P.T. assessed and reviewed signs that would warrant emergency services. Pt verbalized understanding and will have MD assess further tomorrow. (Currently, did not appear to be DVT). Pt also given bed positioning handout secondary to reporting Rt neck/scapula pain can be worse/incr. soreness after waking up. Pt did not bring in compression stockings to practice donning with A/E   Hand Exercises   Other Hand Exercises Gripper set at 35 lbs. resistance to pick up blocks Rt hand for sustained grip strength with little difficulty. Pt then pinching clothespins red to black resistance for pinch strength and lumbrical strengthening. Pt only required cues for positioning with black resistance                     OT Long Term Goals - 01/13/16 1157    OT LONG TERM GOAL #1   Title Independent with HEP  for coordination and hand strengthening (all goals due 02/15/16)   Time 6   Period Weeks   Status Achieved   OT LONG TERM GOAL #2   Title Pt to report greater ease and independence with hooking buttons and tying shoes with A/E prn   Time 6   Period Weeks   Status On-going   OT LONG TERM GOAL #3   Title Pt to report increased ease and 90% legibility with writing using A/E prn   Time 6   Period Weeks   Status On-going   OT LONG TERM GOAL #4   Title Improve grip strength Rt hand by 5 lbs or greater for opening containers/jars   Baseline eval = 32 lbs (Lt = 47 lbs)   Time 6   Period Weeks   Status New   OT LONG TERM GOAL #5   Title Improve coordination as evidenced by reducing speed on 9 hole peg test bilateral hands by 5 sec.    Baseline Rt = 39 sec. Lt = 41.19 sec.    Time 6   Period Weeks   Status New   OT LONG TERM GOAL #6   Title Pt to verbalize understanding with pain management strategies for Rt shoulder/scapula including: proper positioning, stretches, and taping prn   Time 6   Period Weeks   Status New               Plan - 01/18/16 1312    Clinical Impression Statement Pt with chronic neck/shoulder pain which appears more orthopedic in nature. Pt with slightly increased ulnar n. movement in Rt hand   OT Frequency 2x / week   OT Duration 6 weeks   OT Treatment/Interventions Self-care/ADL training;Functional Mobility Training;Patient/family education;Neuromuscular education;Splinting;Manual Therapy;Therapeutic exercises;DME and/or AE instruction;Therapeutic activities;Passive range of motion;Moist Heat;Visual/perceptual remediation/compensation;Electrical Stimulation   Plan practice donning compression stockings with A/E if pt brings in stockings. Further assess scapula and neck/shoulder pain prn. Continue Rt hand function   Consulted and Agree with Plan of Care Patient      Patient will benefit from skilled therapeutic intervention in order to improve the  following deficits and impairments:  Decreased coordination, Impaired flexibility, Impaired sensation, Decreased activity tolerance, Impaired UE functional use, Pain, Decreased strength, Impaired vision/preception, Decreased balance  Visit Diagnosis: Muscle weakness (generalized)  Hemiplegia and hemiparesis following cerebral infarction affecting right dominant side (HCC)  Pain in right shoulder    Problem List Patient Active Problem List   Diagnosis Date Noted  . H/O agent Orange exposure 12/24/2015  . Type 2  diabetes with nephropathy ([place])   . Near syncope 12/22/2015  . History of TIAs 09/14/2015  . CKD stage 3 due to type 2 diabetes mellitus ([place]) 09/14/2015  . Acute lower GI bleeding 10/30/2014  . Orthostatic hypotension 10/30/2014  . History of colonic polyps 10/30/2014  . Depression 10/30/2014  . Symptomatic anemia 10/30/2014  . AKI (acute kidney injury) ([place]) 10/30/2014  . Ataxic gait 09/21/2014  . History of CVA (cerebrovascular accident) 02/22/2013  . Abnormal brain scan 02/21/2013  . Dizziness 02/21/2013  . Weakness 02/21/2013  . Diabetes mellitus ([place]) 02/21/2013  . Hypertension 02/21/2013    Carey Bullocks, OTR/L 01/18/2016, 1:14 PM  [facility] [address] [place], [place], [address] Phone: [phone]   Fax:  [phone]  Name: Cody Silva MRN: [id number] Date of Birth: 11-29-1945

## 2016-01-24 ENCOUNTER — Ambulatory Visit (INDEPENDENT_AMBULATORY_CARE_PROVIDER_SITE_OTHER): Payer: Medicare Other | Admitting: *Deleted

## 2016-01-24 ENCOUNTER — Ambulatory Visit: Payer: Medicare Other | Admitting: Occupational Therapy

## 2016-01-24 ENCOUNTER — Encounter: Payer: Self-pay | Admitting: Occupational Therapy

## 2016-01-24 DIAGNOSIS — R29818 Other symptoms and signs involving the nervous system: Secondary | ICD-10-CM

## 2016-01-24 DIAGNOSIS — R278 Other lack of coordination: Secondary | ICD-10-CM | POA: Diagnosis not present

## 2016-01-24 DIAGNOSIS — M6281 Muscle weakness (generalized): Secondary | ICD-10-CM

## 2016-01-24 DIAGNOSIS — Z95818 Presence of other cardiac implants and grafts: Secondary | ICD-10-CM

## 2016-01-24 NOTE — Therapy (Signed)
[facility] [address] [place], [place], [address] Phone: [phone]   Fax:  [phone]  Occupational Therapy Treatment  Patient Details  Name: Cody Silva MRN: [id number] Date of Birth: September 16, 1945 Referring Provider: Dr. Antony Contras  Encounter Date: 01/24/2016      OT End of Session - 01/24/16 1240    Visit Number 5   Number of Visits 13   Date for OT Re-Evaluation 02/15/16   Authorization Type UHC MCR/TRICARE - G CODE NEEDED   Authorization - Visit Number 5   Authorization - Number of Visits 10   OT Start Time [id number]   OT Stop Time 1229   OT Time Calculation (min) 44 min   Activity Tolerance Patient tolerated treatment well      Past Medical History  Diagnosis Date  . Hypertension   . Diabetic retinopathy ([place])   . Diabetic neuropathy ([place])   . Colon polyps   . History of blood transfusion 10/30/2014    hematochezia  . Anemia   . OSA on CPAP     "suppose to wear mask; I've got a call in for an equipment change" (10/30/2014)  . Type II diabetes mellitus ([place])   . Headache     "@ least 3 times/wk" (10/30/2014)  . Stroke [facility]) 2014    left extremity deficits; facial left  . Arthritis     "right arm, right ankle, right side" (10/30/2014)  . History of gout   . PTSD (post-traumatic stress disorder)     "service related"    Past Surgical History  Procedure Laterality Date  . Cataract extraction w/ intraocular lens  implant, bilateral Bilateral 2014-2015  . Tumor removal Right ~ 1976    "arm; had to take bone left hip to add to the repair"  . Bone graft hip iliac crest Left ~ 1976  . Ep implantable device N/A 12/24/2015    Procedure: Loop Recorder Insertion;  Surgeon: Thompson Grayer, MD;  Location: [facility];  Service: Cardiovascular;  Laterality: N/A;    There were no vitals filed for this visit.      Subjective Assessment - 01/24/16 1146    Subjective  I was late this morning  because my sugar was low - it is still too low.    Pertinent History TIA 1/17, CVA 2014   Patient Stated Goals To write better and get my Rt hand/arm stronger             Pt arrived and stated his blood sugar was low. Provided PB crackers and coke and pt then stated he felt much better.          OT Treatments/Exercises (OP) - 01/24/16 0001    ADLs   Writing Practiced writing using brown foam to build up pen - pt reports it is much easier to write with larger pen.  Pt reports he did a mix of cursive and printing prior to the stroke.  Issued brown foam for pt to use at home.    Exercises   Exercises Hand   Hand Exercises   Other Hand Exercises Therapeutic resistive activities to address strength and coordination in R hand - pt reports he can already tell that his strength is improving as he is now able to open containers at home that he was not able to open before.  OT Long Term Goals - 01/24/16 1239    OT LONG TERM GOAL #1   Title Independent with HEP for coordination and hand strengthening (all goals due 02/15/16)   Time 6   Period Weeks   Status Achieved   OT LONG TERM GOAL #2   Title Pt to report greater ease and independence with hooking buttons and tying shoes with A/E prn   Time 6   Period Weeks   Status On-going   OT LONG TERM GOAL #3   Title Pt to report increased ease and 90% legibility with writing using A/E prn   Time 6   Period Weeks   Status On-going   OT LONG TERM GOAL #4   Title Improve grip strength Rt hand by 5 lbs or greater for opening containers/jars   Baseline eval = 32 lbs (Lt = 47 lbs)   Time 6   Period Weeks   Status New   OT LONG TERM GOAL #5   Title Improve coordination as evidenced by reducing speed on 9 hole peg test bilateral hands by 5 sec.    Baseline Rt = 39 sec. Lt = 41.19 sec.    Time 6   Period Weeks   Status New   OT LONG TERM GOAL #6   Title Pt to verbalize understanding with pain  management strategies for Rt shoulder/scapula including: proper positioning, stretches, and taping prn   Time 6   Period Weeks   Status New               Plan - 01/24/16 1239    Clinical Impression Statement Pt progressing toward goals and reports he is able to use his right hand more functional at home.   Rehab Potential Good   OT Frequency 2x / week   OT Duration 6 weeks   OT Treatment/Interventions Self-care/ADL training;Functional Mobility Training;Patient/family education;Neuromuscular education;Splinting;Manual Therapy;Therapeutic exercises;DME and/or AE instruction;Therapeutic activities;Passive range of motion;Moist Heat;Visual/perceptual remediation/compensation;Electrical Stimulation   Plan strength and coordination, funcitonal use of R hand.   Consulted and Agree with Plan of Care Patient      Patient will benefit from skilled therapeutic intervention in order to improve the following deficits and impairments:  Decreased coordination, Impaired flexibility, Impaired sensation, Decreased activity tolerance, Impaired UE functional use, Pain, Decreased strength, Impaired vision/preception, Decreased balance  Visit Diagnosis: Muscle weakness (generalized)  Other lack of coordination  Other symptoms and signs involving the nervous system    Problem List Patient Active Problem List   Diagnosis Date Noted  . H/O agent Orange exposure 12/24/2015  . Type 2 diabetes with nephropathy ([place])   . Near syncope 12/22/2015  . History of TIAs 09/14/2015  . CKD stage 3 due to type 2 diabetes mellitus ([place]) 09/14/2015  . Acute lower GI bleeding 10/30/2014  . Orthostatic hypotension 10/30/2014  . History of colonic polyps 10/30/2014  . Depression 10/30/2014  . Symptomatic anemia 10/30/2014  . AKI (acute kidney injury) ([place]) 10/30/2014  . Ataxic gait 09/21/2014  . History of CVA (cerebrovascular accident) 02/22/2013  . Abnormal brain scan 02/21/2013  . Dizziness 02/21/2013   . Weakness 02/21/2013  . Diabetes mellitus ([place]) 02/21/2013  . Hypertension 02/21/2013    Quay Burow, OTR/L 01/24/2016, 12:42 PM  [facility] [address] [place], [place], [address] Phone: [phone]   Fax:  [phone]  Name: Cody Silva MRN: [id number] Date of Birth: [birth year]

## 2016-01-25 ENCOUNTER — Encounter: Payer: Self-pay | Admitting: Occupational Therapy

## 2016-01-25 ENCOUNTER — Ambulatory Visit: Payer: Medicare Other | Admitting: Occupational Therapy

## 2016-01-25 DIAGNOSIS — R278 Other lack of coordination: Secondary | ICD-10-CM

## 2016-01-25 DIAGNOSIS — M6281 Muscle weakness (generalized): Secondary | ICD-10-CM

## 2016-01-25 DIAGNOSIS — I69351 Hemiplegia and hemiparesis following cerebral infarction affecting right dominant side: Secondary | ICD-10-CM

## 2016-01-25 NOTE — Therapy (Signed)
[facility] [address] [place], [place], [address] Phone: [phone]   Fax:  [phone]  Occupational Therapy Treatment  Patient Details  Name: Cody Silva MRN: [id number] Date of Birth: [birth year] Referring Provider: Dr. Antony Contras  Encounter Date: 01/25/2016      OT End of Session - 01/25/16 1237    Visit Number 6   Number of Visits 13   Date for OT Re-Evaluation 02/15/16   Authorization Type UHC MCR/TRICARE - G CODE NEEDED   Authorization - Visit Number 6   Authorization - Number of Visits 10   OT Start Time 1200  Pt arrived 15 minutes late   OT Stop Time 1230   OT Time Calculation (min) 30 min   Activity Tolerance Patient tolerated treatment well      Past Medical History  Diagnosis Date  . Hypertension   . Diabetic retinopathy ([place])   . Diabetic neuropathy ([place])   . Colon polyps   . History of blood transfusion 10/30/2014    hematochezia  . Anemia   . OSA on CPAP     "suppose to wear mask; I've got a call in for an equipment change" (10/30/2014)  . Type II diabetes mellitus ([place])   . Headache     "@ least 3 times/wk" (10/30/2014)  . Stroke [facility]) 2014    left extremity deficits; facial left  . Arthritis     "right arm, right ankle, right side" (10/30/2014)  . History of gout   . PTSD (post-traumatic stress disorder)     "service related"    Past Surgical History  Procedure Laterality Date  . Cataract extraction w/ intraocular lens  implant, bilateral Bilateral 2014-2015  . Tumor removal Right ~ 1976    "arm; had to take bone left hip to add to the repair"  . Bone graft hip iliac crest Left ~ 1976  . Ep implantable device N/A 12/24/2015    Procedure: Loop Recorder Insertion;  Surgeon: Thompson Grayer, MD;  Location: [facility];  Service: Cardiovascular;  Laterality: N/A;    There were no vitals filed for this visit.      Subjective Assessment - 01/25/16 1159    Subjective   No changes   Pertinent History TIA 1/17, CVA 2014   Limitations loop recorder   Patient Stated Goals To write better and get my Rt hand/arm stronger   Currently in Pain? No/denies                      OT Treatments/Exercises (OP) - 01/25/16 0001    Hand Exercises   Other Hand Exercises Gripper set at 35 lbs resistance to pick up blocks Rt hand for sustained grip strength with min difficulty   Fine Motor Coordination   Other Fine Motor Exercises Pt placing O'Connor pegs in pegboard with tweezers Rt hand with max difficulty and extra time, then removing with min difficulty. Pt noted to flex PIP and hyperextend DIP joint of index finger                     OT Long Term Goals - 01/24/16 1239    OT LONG TERM GOAL #1   Title Independent with HEP for coordination and hand strengthening (all goals due 02/15/16)   Time 6   Period Weeks   Status Achieved   OT LONG TERM GOAL #2   Title Pt to report greater ease and independence with  hooking buttons and tying shoes with A/E prn   Time 6   Period Weeks   Status On-going   OT LONG TERM GOAL #3   Title Pt to report increased ease and 90% legibility with writing using A/E prn   Time 6   Period Weeks   Status On-going   OT LONG TERM GOAL #4   Title Improve grip strength Rt hand by 5 lbs or greater for opening containers/jars   Baseline eval = 32 lbs (Lt = 47 lbs)   Time 6   Period Weeks   Status New   OT LONG TERM GOAL #5   Title Improve coordination as evidenced by reducing speed on 9 hole peg test bilateral hands by 5 sec.    Baseline Rt = 39 sec. Lt = 41.19 sec.    Time 6   Period Weeks   Status New   OT LONG TERM GOAL #6   Title Pt to verbalize understanding with pain management strategies for Rt shoulder/scapula including: proper positioning, stretches, and taping prn   Time 6   Period Weeks   Status New               Plan - 01/25/16 1238    Clinical Impression Statement Pt progressing towards  goals and increased use of RUE.    OT Frequency 2x / week   OT Duration 6 weeks   OT Treatment/Interventions Self-care/ADL training;Functional Mobility Training;Patient/family education;Neuromuscular education;Splinting;Manual Therapy;Therapeutic exercises;DME and/or AE instruction;Therapeutic activities;Passive range of motion;Moist Heat;Visual/perceptual remediation/compensation;Electrical Stimulation   Plan begin assessing LTG's   Consulted and Agree with Plan of Care Patient      Patient will benefit from skilled therapeutic intervention in order to improve the following deficits and impairments:  Decreased coordination, Impaired flexibility, Impaired sensation, Decreased activity tolerance, Impaired UE functional use, Pain, Decreased strength, Impaired vision/preception, Decreased balance  Visit Diagnosis: Muscle weakness (generalized)  Other lack of coordination  Hemiplegia and hemiparesis following cerebral infarction affecting right dominant side [facility])    Problem List Patient Active Problem List   Diagnosis Date Noted  . H/O agent Orange exposure 12/24/2015  . Type 2 diabetes with nephropathy ([place])   . Near syncope 12/22/2015  . History of TIAs 09/14/2015  . CKD stage 3 due to type 2 diabetes mellitus ([place]) 09/14/2015  . Acute lower GI bleeding 10/30/2014  . Orthostatic hypotension 10/30/2014  . History of colonic polyps 10/30/2014  . Depression 10/30/2014  . Symptomatic anemia 10/30/2014  . AKI (acute kidney injury) ([place]) 10/30/2014  . Ataxic gait 09/21/2014  . History of CVA (cerebrovascular accident) 02/22/2013  . Abnormal brain scan 02/21/2013  . Dizziness 02/21/2013  . Weakness 02/21/2013  . Diabetes mellitus ([place]) 02/21/2013  . Hypertension 02/21/2013    Carey Bullocks, OTR/L 01/25/2016, 12:41 PM  [facility] [address] [place], [place], [address] Phone: [phone]   Fax:   [phone]  Name: Cody Silva MRN: [id number] Date of Birth: 07-Jul-1946

## 2016-01-25 NOTE — Progress Notes (Signed)
Carelink Summary Report / Loop Recorder 

## 2016-02-01 ENCOUNTER — Ambulatory Visit: Payer: Medicare Other | Admitting: Occupational Therapy

## 2016-02-03 ENCOUNTER — Ambulatory Visit: Payer: Medicare Other | Admitting: Occupational Therapy

## 2016-02-15 ENCOUNTER — Telehealth: Payer: Self-pay | Admitting: Occupational Therapy

## 2016-02-15 ENCOUNTER — Ambulatory Visit: Payer: Medicare Other | Attending: Internal Medicine | Admitting: Occupational Therapy

## 2016-02-15 DIAGNOSIS — M6281 Muscle weakness (generalized): Secondary | ICD-10-CM | POA: Insufficient documentation

## 2016-02-15 DIAGNOSIS — M25511 Pain in right shoulder: Secondary | ICD-10-CM | POA: Insufficient documentation

## 2016-02-15 DIAGNOSIS — M79621 Pain in right upper arm: Secondary | ICD-10-CM | POA: Insufficient documentation

## 2016-02-15 DIAGNOSIS — R278 Other lack of coordination: Secondary | ICD-10-CM | POA: Insufficient documentation

## 2016-02-15 NOTE — Telephone Encounter (Signed)
Therapist called patient and spoke to him re: last 2 cancellations and today's no show to see if pt wanted to keep next OT appointment on 02/22/16. Pt reports he did not cancel the last 2 appointments, but the front office did because he was running 15 minutes late. Pt reports he did not know about today's appointment at 8:00. Pt agrees to come to next scheduled appointment on 02/22/16 at 1:15 pm. Therapist did explain to patient that if patient is more than 15 minutes late, typically we do try and reschedule because our appointments are only 45 minutes long.

## 2016-02-22 ENCOUNTER — Encounter: Payer: Self-pay | Admitting: Occupational Therapy

## 2016-02-22 ENCOUNTER — Ambulatory Visit (INDEPENDENT_AMBULATORY_CARE_PROVIDER_SITE_OTHER): Payer: Medicare Other | Admitting: *Deleted

## 2016-02-22 ENCOUNTER — Ambulatory Visit: Payer: Medicare Other | Admitting: Occupational Therapy

## 2016-02-22 DIAGNOSIS — Z95818 Presence of other cardiac implants and grafts: Secondary | ICD-10-CM

## 2016-02-22 DIAGNOSIS — R278 Other lack of coordination: Secondary | ICD-10-CM

## 2016-02-22 DIAGNOSIS — M79621 Pain in right upper arm: Secondary | ICD-10-CM | POA: Diagnosis present

## 2016-02-22 DIAGNOSIS — M6281 Muscle weakness (generalized): Secondary | ICD-10-CM

## 2016-02-22 DIAGNOSIS — M25511 Pain in right shoulder: Secondary | ICD-10-CM | POA: Diagnosis present

## 2016-02-22 NOTE — Therapy (Signed)
[facility] [address] [place], [place], [address] Phone: [phone]   Fax:  [phone]  Occupational Therapy Treatment  Patient Details  Name: Cody Silva MRN: [id number] Date of Birth: 29-Dec-1945 Referring Provider: Dr. Antony Contras  Encounter Date: 02/22/2016      OT End of Session - 02/22/16 1428    Visit Number 7   Number of Visits 13   Authorization Type UHC MCR/TRICARE - G CODE NEEDED   Authorization - Visit Number 7   Authorization - Number of Visits 10   OT Start Time 1320   OT Stop Time 1415   OT Time Calculation (min) 55 min   Activity Tolerance Patient tolerated treatment well      Past Medical History  Diagnosis Date  . Hypertension   . Diabetic retinopathy ([place])   . Diabetic neuropathy ([place])   . Colon polyps   . History of blood transfusion 10/30/2014    hematochezia  . Anemia   . OSA on CPAP     "suppose to wear mask; I've got a call in for an equipment change" (10/30/2014)  . Type II diabetes mellitus ([place])   . Headache     "@ least 3 times/wk" (10/30/2014)  . Stroke [facility]) 2014    left extremity deficits; facial left  . Arthritis     "right arm, right ankle, right side" (10/30/2014)  . History of gout   . PTSD (post-traumatic stress disorder)     "service related"    Past Surgical History  Procedure Laterality Date  . Cataract extraction w/ intraocular lens  implant, bilateral Bilateral 2014-2015  . Tumor removal Right ~ 1976    "arm; had to take bone left hip to add to the repair"  . Bone graft hip iliac crest Left ~ 1976  . Ep implantable device N/A 12/24/2015    Procedure: Loop Recorder Insertion;  Surgeon: Thompson Grayer, MD;  Location: [facility];  Service: Cardiovascular;  Laterality: N/A;    There were no vitals filed for this visit.      Subjective Assessment - 02/22/16 1327    Subjective  My Rt hand is a little worse   Pertinent History TIA 1/17, CVA  2014   Limitations loop recorder   Patient Stated Goals To write better and get my Rt hand/arm stronger   Currently in Pain? Yes   Pain Score 7    Pain Location --  head to shoulder   Pain Orientation Right   Pain Descriptors / Indicators Aching   Pain Type Chronic pain   Pain Onset More than a month ago   Pain Frequency Constant   Aggravating Factors  rainy/cold weather   Pain Relieving Factors meds, heat            [facility] OT Assessment - 02/22/16 0001    Coordination   Right 9 Hole Peg Test 42.88 sec   Left 9 Hole Peg Test 38.88 sec   Hand Function   Right Hand Grip (lbs) 52 lbs                  OT Treatments/Exercises (OP) - 02/22/16 0001    ADLs   UB Dressing Practiced hooking cuff buttons on button up shirt with button hook with extra time, but doing I'ly   Writing Practiced writing with increased legibility in print vs. cursive. Also noted decreased legibility d/t decreased vision from diabetic retinopathy and unable to see where he  was writing.    ADL Comments Reviewed all A/E recommendations. Discussed pain management strategies including: bed positioning, posture, and heat for Rt neck/shoulder. Also encouraged pt to discuss with neurosurgeon if traction would be beneficial for neck and if so, to get orthopedic P.T. referral. Pt also given bed positioning handout, as well as A/E recommendations again.    Hand Exercises   Other Hand Exercises Reviewed all coordination and putty ex's from HEP and encouraged pt to continue to maintain hand function.                      OT Long Term Goals - 2016-02-28 1429    OT LONG TERM GOAL #1   Title Independent with HEP for coordination and hand strengthening (all goals due 02/15/16)   Time 6   Period Weeks   Status Achieved   OT LONG TERM GOAL #2   Title Pt to report greater ease and independence with hooking buttons and tying shoes with A/E prn   Time 6   Period Weeks   Status Achieved  using A/E   OT  LONG TERM GOAL #3   Title Pt to report increased ease and 90% legibility with writing using A/E prn   Time 6   Period Weeks   Status Partially Met  Met in print with built up pen and reading glasses, not met in cursive   OT LONG TERM GOAL #4   Title Improve grip strength Rt hand by 5 lbs or greater for opening containers/jars   Baseline eval = 32 lbs (Lt = 47 lbs)   Time 6   Period Weeks   Status Achieved  Rt = 52 lbs   OT LONG TERM GOAL #5   Title Improve coordination as evidenced by reducing speed on 9 hole peg test bilateral hands by 5 sec.    Baseline Rt = 39 sec. Lt = 41.19 sec.    Time 6   Period Weeks   Status Not Met  Rt = 42 sec, Lt = 38 sec   OT LONG TERM GOAL #6   Title Pt to verbalize understanding with pain management strategies for Rt shoulder/scapula including: proper positioning, stretches, and taping prn   Time 6   Period Weeks   Status Achieved               Plan - Feb 28, 2016 1431    Clinical Impression Statement Pt has made improvements in grip strength Rt hand, and increased ease with ADLS using A/E. Pt has not made improvements in coordination, but feel some deficits were pre-morbid to CVA due to old elbow injury involving ulnar n. deficits and due to cervical neck compression.    Plan D/C O.T.  (Recommend consulting with neuro surgeon if cervical traction would be appropriate and to get orthopedic P.T. referral is so - pt agrees). Pt to follow up in acquiring recommended A/E with medical supply store   Consulted and Agree with Plan of Care Patient      Patient will benefit from skilled therapeutic intervention in order to improve the following deficits and impairments:     Visit Diagnosis: Muscle weakness (generalized)  Other lack of coordination  Pain in right shoulder  Pain in right upper arm      G-Codes - February 28, 2016 1434    Functional Assessment Tool Used RUE: grip 52 lbs, 9 hole peg 42 sec., pain up to 7/10   Functional Limitation  Carrying, moving and handling objects  Carrying, Moving and Handling Objects Goal Status [phone]) At least 20 percent but less than 40 percent impaired, limited or restricted   Carrying, Moving and Handling Objects Discharge Status [id number]) At least 20 percent but less than 40 percent impaired, limited or restricted      Problem List Patient Active Problem List   Diagnosis Date Noted  . H/O agent Orange exposure 12/24/2015  . Type 2 diabetes with nephropathy ([place])   . Near syncope 12/22/2015  . History of TIAs 09/14/2015  . CKD stage 3 due to type 2 diabetes mellitus ([place]) 09/14/2015  . Acute lower GI bleeding 10/30/2014  . Orthostatic hypotension 10/30/2014  . History of colonic polyps 10/30/2014  . Depression 10/30/2014  . Symptomatic anemia 10/30/2014  . AKI (acute kidney injury) ([place]) 10/30/2014  . Ataxic gait 09/21/2014  . History of CVA (cerebrovascular accident) 02/22/2013  . Abnormal brain scan 02/21/2013  . Dizziness 02/21/2013  . Weakness 02/21/2013  . Diabetes mellitus ([place]) 02/21/2013  . Hypertension 02/21/2013     OCCUPATIONAL THERAPY DISCHARGE SUMMARY  Visits from Start of Care: 7  Current functional level related to goals / functional outcomes: SEE ABOVE   Remaining deficits: Rt hand coordination Ulnar nerve deficits RUE and neck pain   Education / Equipment: HEP, A/E recommendations, pain reduction strategies  Plan: Patient agrees to discharge.  Patient goals were partially met. Patient is being discharged due to being pleased with the current functional level. and reaching maximal rehab potential at this time ?????         Carey Bullocks, OTR/L 02/22/2016, 2:35 PM  [facility] [address] [place], [place], [address] Phone: [phone]   Fax:  [phone]  Name: HUMPHREY GUERREIRO MRN: [id number] Date of Birth: [birth year]

## 2016-02-22 NOTE — Progress Notes (Signed)
Carelink Summary Report / Loop Recorder 

## 2016-02-23 ENCOUNTER — Telehealth: Payer: Self-pay | Admitting: *Deleted

## 2016-02-23 NOTE — Telephone Encounter (Signed)
Spoke with Cody Silva regarding walk-in request for MRI clearance due to loop recorder. He says that [facility] needs information about device and MRI. I have instructed him to send a manual transmission the morning prior to MRI to assure we have a record of all episodes in the rare instance that the MRI will erase stored information. He verbalizes understanding. He has lost his device ID card- I will have [employer] send him a new one.   Spoke with clinical staff at [facility] [phone]) and let them know what type of ILR he has implanted and that he is cleared to have an MRI. She verbalizes understanding.

## 2016-03-04 LAB — CUP PACEART REMOTE DEVICE CHECK
Date Time Interrogation Session: 20170620130557
MDC IDC SESS DTM: 20170521130523

## 2016-03-21 ENCOUNTER — Ambulatory Visit: Payer: Medicare Other | Admitting: Neurology

## 2016-03-23 ENCOUNTER — Ambulatory Visit (INDEPENDENT_AMBULATORY_CARE_PROVIDER_SITE_OTHER): Payer: Medicare Other | Admitting: *Deleted

## 2016-03-23 DIAGNOSIS — Z95818 Presence of other cardiac implants and grafts: Secondary | ICD-10-CM

## 2016-03-23 NOTE — Progress Notes (Signed)
Carelink Summary Report / Loop Recorder 

## 2016-04-11 ENCOUNTER — Ambulatory Visit: Payer: Medicare Other | Attending: Internal Medicine | Admitting: Occupational Therapy

## 2016-04-11 DIAGNOSIS — M6281 Muscle weakness (generalized): Secondary | ICD-10-CM | POA: Insufficient documentation

## 2016-04-11 NOTE — Therapy (Signed)
[facility] [address] [place], [place], [address] Phone: [phone]   Fax:  [phone]  Occupational Therapy Treatment  Patient Details  Name: Cody Silva MRN: [id number] Date of Birth: [birth year] Referring Provider: Dr. Antony Contras  Encounter Date: 04/11/2016    Past Medical History:  Diagnosis Date  . Anemia   . Arthritis    "right arm, right ankle, right side" (10/30/2014)  . Colon polyps   . Diabetic neuropathy ([place])   . Diabetic retinopathy ([place])   . Headache    "@ least 3 times/wk" (10/30/2014)  . History of blood transfusion 10/30/2014   hematochezia  . History of gout   . Hypertension   . OSA on CPAP    "suppose to wear mask; I've got a call in for an equipment change" (10/30/2014)  . PTSD (post-traumatic stress disorder)    "service related"  . Stroke [facility]) 2014   left extremity deficits; facial left  . Type II diabetes mellitus ([place])     Past Surgical History:  Procedure Laterality Date  . BONE GRAFT HIP ILIAC CREST Left ~ 1976  . CATARACT EXTRACTION W/ INTRAOCULAR LENS  IMPLANT, BILATERAL Bilateral 2014-2015  . EP IMPLANTABLE DEVICE N/A 12/24/2015   Procedure: Loop Recorder Insertion;  Surgeon: Thompson Grayer, MD;  Location: [facility];  Service: Cardiovascular;  Laterality: N/A;  . TUMOR REMOVAL Right ~ 1976   "arm; had to take bone left hip to add to the repair"    There were no vitals filed for this visit.                                 OT Long Term Goals - 02/22/16 1429      OT LONG TERM GOAL #1   Title Independent with HEP for coordination and hand strengthening (all goals due 02/15/16)   Time 6   Period Weeks   Status Achieved     OT LONG TERM GOAL #2   Title Pt to report greater ease and independence with hooking buttons and tying shoes with A/E prn   Time 6   Period Weeks   Status Achieved  using A/E     OT LONG TERM GOAL #3   Title Pt to report increased ease and 90% legibility with writing using A/E prn   Time 6   Period Weeks   Status Partially Met  Met in print with built up pen and reading glasses, not met in cursive     OT LONG TERM GOAL #4   Title Improve grip strength Rt hand by 5 lbs or greater for opening containers/jars   Baseline eval = 32 lbs (Lt = 47 lbs)   Time 6   Period Weeks   Status Achieved  Rt = 52 lbs     OT LONG TERM GOAL #5   Title Improve coordination as evidenced by reducing speed on 9 hole peg test bilateral hands by 5 sec.    Baseline Rt = 39 sec. Lt = 41.19 sec.    Time 6   Period Weeks   Status Not Met  Rt = 42 sec, Lt = 38 sec     OT LONG TERM GOAL #6   Title Pt to verbalize understanding with pain management strategies for Rt shoulder/scapula including: proper positioning, stretches, and taping prn   Time 6   Period Weeks   Status Achieved  Pt was scheduled by front office for OT eval.  Misunderstanding of order from Dr. Leonie Man- pt had just been discharged from OT  On 02/22/2016 with AE recommendations. Primary therapist asked for MD order for rocker knife, button hook and sock donner for TED hose. MD signed off  On equipment however paperwork was not given to pt - instead order was sent back to [facility] and interpreted as an OT order for eval.  Explained to pt and showed  Pt the three pieces of equipment - pt was given order sheet with MD signature and will now approach [facility] for potential assistance for payment for AE.  Pt also reports neurosurgeon now  Recommending cervical surgery. No further OT needs at this time and no charge for visit.       Patient will benefit from skilled therapeutic intervention in order to improve the following deficits and impairments:     Visit Diagnosis: Muscle weakness (generalized)    Problem List Patient Active Problem List   Diagnosis Date Noted  . H/O agent Orange exposure 12/24/2015  . Type 2 diabetes with  nephropathy ([place])   . Near syncope 12/22/2015  . History of TIAs 09/14/2015  . CKD stage 3 due to type 2 diabetes mellitus ([place]) 09/14/2015  . Acute lower GI bleeding 10/30/2014  . Orthostatic hypotension 10/30/2014  . History of colonic polyps 10/30/2014  . Depression 10/30/2014  . Symptomatic anemia 10/30/2014  . AKI (acute kidney injury) ([place]) 10/30/2014  . Ataxic gait 09/21/2014  . History of CVA (cerebrovascular accident) 02/22/2013  . Abnormal brain scan 02/21/2013  . Dizziness 02/21/2013  . Weakness 02/21/2013  . Diabetes mellitus ([place]) 02/21/2013  . Hypertension 02/21/2013    Quay Burow, OTR/L 04/11/2016, 1:52 PM  [facility] [address] [place], [place], [address] Phone: [phone]   Fax:  [phone]  Name: Cody Silva MRN: [id number] Date of Birth: 23-Feb-1946

## 2016-04-13 LAB — CUP PACEART REMOTE DEVICE CHECK: Date Time Interrogation Session: 20170720133642

## 2016-04-24 ENCOUNTER — Ambulatory Visit (INDEPENDENT_AMBULATORY_CARE_PROVIDER_SITE_OTHER): Payer: Medicare Other | Admitting: *Deleted

## 2016-04-24 DIAGNOSIS — Z95818 Presence of other cardiac implants and grafts: Secondary | ICD-10-CM

## 2016-04-24 NOTE — Progress Notes (Signed)
Carelink Summary Report / Loop Report 

## 2016-05-17 ENCOUNTER — Telehealth: Payer: Self-pay | Admitting: *Deleted

## 2016-05-17 NOTE — Telephone Encounter (Signed)
Patient walked into office with questions regarding his ILR.  Advised patient that his home monitor is working and is updated.  Assisted patient in ordering new LINQ ID card online.  Patient is scheduled to have an MRI at the [facility] in [place] next week.  He states that they are concerned that he cannot have an MRI due to his ILR.  Attempted to reach someone at the [facility] at 1624, but phone lines were off for the evening.    Called patient to make him aware and he will call our office tomorrow morning with a direct number for imaging at the [facility].  He is appreciative of assistance.

## 2016-05-18 NOTE — Telephone Encounter (Signed)
LMOM for Imaging Services at the [facility] office.  Advised patient that I will continue to try to reach someone at the office regarding his canceled MRI and he is appreciative.

## 2016-05-19 NOTE — Telephone Encounter (Signed)
Spoke with Timmothy Sours in the MRI department at the [facility].  Per Timmothy Sours (MRI safety office), there is a risk that the ILR could move or heat up under the skin during an MRI.  Reviewed [employer]'s MRI recommendations with Timmothy Sours and advised that the device is MRI conditional.  Timmothy Sours recommended that I call the MRI scheduler to see if the order is still active and to attempt to reschedule the MRI.  He states that the situation would then have to be reviewed by a radiologist to determine safety.  Spoke with Bill in MRI scheduling.  Per Bill, MRI order was canceled by Timmothy Sours due to lack of MRI safety.  He states that Monico Hoar, NP (at Dr. Larina Bras office) acknowledged that MRI was canceled.  Per [facility] does scan conditional devices.  Bill recommended that the patient seek a new order for an MRI from Dr. Alferd Patee and have it scheduled at the [facility].  Called patient to make him aware.  He understands that the only [facility] in our area that will scan an ILR is in [place].  Patient will contact Dr. Lanell Matar office for a new order to have MRI performed at the [facility].  Patient verbalizes appreciation.  He denies additional questions or concerns at this time.

## 2016-05-22 ENCOUNTER — Ambulatory Visit (INDEPENDENT_AMBULATORY_CARE_PROVIDER_SITE_OTHER): Payer: Medicare Other | Admitting: *Deleted

## 2016-05-22 DIAGNOSIS — Z95818 Presence of other cardiac implants and grafts: Secondary | ICD-10-CM | POA: Diagnosis not present

## 2016-05-22 DIAGNOSIS — R55 Syncope and collapse: Secondary | ICD-10-CM | POA: Diagnosis not present

## 2016-05-22 LAB — CUP PACEART REMOTE DEVICE CHECK: MDC IDC SESS DTM: 20170819140613

## 2016-05-22 NOTE — Progress Notes (Signed)
Carelink Summary Report / Loop Recorder 

## 2016-05-22 NOTE — Progress Notes (Signed)
Carelink summary report received. Battery status OK. Normal device function. Fair histograms. No new symptom episodes, tachy episodes, brady, or pause episodes. No new AF episodes. Monthly summary reports and ROV/PRN

## 2016-06-15 ENCOUNTER — Ambulatory Visit (INDEPENDENT_AMBULATORY_CARE_PROVIDER_SITE_OTHER): Payer: Medicare Other | Admitting: Nurse Practitioner

## 2016-06-15 ENCOUNTER — Encounter: Payer: Self-pay | Admitting: Nurse Practitioner

## 2016-06-15 VITALS — BP 167/90 | HR 67 | Ht 73.0 in | Wt 231.8 lb

## 2016-06-15 DIAGNOSIS — R260 Ataxic gait: Secondary | ICD-10-CM | POA: Diagnosis not present

## 2016-06-15 DIAGNOSIS — Z8673 Personal history of transient ischemic attack (TIA), and cerebral infarction without residual deficits: Secondary | ICD-10-CM

## 2016-06-15 DIAGNOSIS — I10 Essential (primary) hypertension: Secondary | ICD-10-CM | POA: Diagnosis not present

## 2016-06-15 LAB — CUP PACEART REMOTE DEVICE CHECK: MDC IDC SESS DTM: 20170918150209

## 2016-06-15 NOTE — Progress Notes (Signed)
[facility]  PATIENT: Cody Silva DOB: 1946-09-02   REASON FOR VISIT: Follow-up for CVA HISTORY FROM: Patient    HISTORY OF PRESENT ILLNESS:70 year male seen for first office f/u visit after [facility] admission on 02/20/2013.he presented with 3 month h/o intermittent slurred speech, leg weakness and 1 episode of syncope.he was evaluated at the [facility] clinic and had several head CTs which were unremarkable.he had 2 episodes of falling on day of admission and MRI brain showed a large 4 x 4 cm diffusion positive lesion in corpus callosum involving right more than left cingulate guyrus with only faint enhancement. It was unclear wether this was a callosal branch anterior cerebral artery infarct or a mass lesion.MRA showed decrease flow in median artery of corpus callosum.Carotid dopplers and 2DEcho showed normal ejection fraction. EEG was normal. Lipid profile was normal. HbA1c was elevated at 7.5% He is tolerating plavix well without bleeding or side effects.He states his gait and balance have improved and he is walking well. He notices some imbalance when he is in a hurry or tired but is able to catch himself without falling. He has sleep apnea but is not using his CPAP mask daily.  UPDATE 09/12/13 (LL): [name] returns to office for stroke revisit. He states he has been doing well, having some headaches but not too often. He complains that his memory is not as well as before the stroke. His wife does not notice any problem with his memory. His follow up MRI showed expected evolutionary changes, no mention of mass lesion. He is tolerating Plavix well but has stopped currently to have a tooth extracted early next week. His BP is elevated in office today, at 166/92, but he says it is usually lower.   UPDATE 03/12/14 (LL): Since last visit, he has not had any new neurovascular symptoms. He has started to have more difficulties with imbalance. 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 His blood pressure is elevated in the office today, 188/90.  He states it is not that high when he takes it at home. He is having progressively more acting out dreams, but his wife states that he does not get out of bed.  He has PTSD from being in the [place] War, and has had treatment through the [place]. He does not wear his cpap consistently. His blood sugars in the morning range from 130-170, but he has had some problems with lows as well.  He is tolerating Plavix well with no signs of significant bleeding or bruising. Update 1/18/2016PS : He returns for follow-up after last visit 6 months ago. She continues to have poor balance. He did go for outpatient physical therapy following the last visit but it did not seem to help a lot. The patient's wife states that he had a transient episode of facial droop as well as increased imbalance about a month ago but they did not seek medical help at that time. The patient continues not to use his CPAP regularly but he does plan to go to the [place] and get his recalibrated so it fits better. He remains on Plavix which is tolerating well but plans to get an elective dental procedure and wants to stop it for 3 days prior to the procedure. He states his fasting sugars have been quite good and last hemoglobin A1c was 7.0  3 months ago. His blood pressure has been well controlled. His last lipid profile was also fine.  He has not had a carotid Doppler done since he left the hospital a year and a half ago.  Update 03/22/2015 PS: He returns for follow-up after last visit 6 months ago. He was admitted in February 2016 twice to the hospital initially with episodes of GI bleed and subsequently with anemia and generalized weakness. He see blood transfusion. He'll stop Plavix for 7 days but GI workup did not reveal a source of bleeding. Plavix has been restarted and is tolerating it well. He however does still complain of generalized weakness and at times feels he is weak  and cannot get out of a chair and has trouble climbing steps. He has not had any recent lab work done but he he plans to see his primary care physician at the [place] but is not happy with the care and plans for Transfer to a new physician in [place]. He still continues to have occasional speech difficulties and continues to have mild balance difficulties though he is not had any falls. Update 4/12/2017PS : He returns for follow-up after last visit 9 months ago. He was hospitalized briefly in January 2017 with transient episode of slurred speech, facial droop and blurred vision. MRI scan of the brain personally reviewed did not reveal an acute infarct. MRA brain showed no large vessel stenosis or occlusion. Carotid ultrasound was unremarkable. Transthoracic echo showed normal ejection fraction. Hemoglobin A1c was elevated at 7.9 and LDL cholesterol 137. Patient was started on Plavix and continued on his blood pressure and cholesterol medications. He states is tolerating all medications well without any side effects. His blood pressure is not doing well and  today is elevated at 148/82. He has recently changed his primary care physician and plans to work with him towards getting better controlled. His fasting sugars have been all okay but his last hemoglobin A1c in January was elevated at 7.9. Patient states he has significant difficulty with walking particularly steps he feels off balance. When he bends down suddenly or turns his neck he feels off balance. His noticed weakness in his hand muscles as well as wasting and often drops objects from his hands. and he has seen in spine surgeon at [facility] and plans treatment of her neck surgery but wants to wait till June to have this done. He also plans on having some tooth removed and isn't willing to wait till June as well. At times he has had the malls while climbing steps. 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 He  wants to be referred for outpatient occupational therapy to work on his hand strength UPDATE 06/15/16 CM Mr. Ferrin, 70 year old male returns for follow-up. He has a history of CVA in 2014. Patient is currently on Plavix without further stroke or TIA symptoms since last seen blood pressure was elevated in the office today however he states he is taking his medications. He continues to have some balance problems and difficulty going up and down steps. He is using a single-point cane to ambulate which makes him safer. If he makes sudden movements he feels off balance. He is still planning on having cervical spine surgery andhe needs to be off Plavix days prior to the procedure. Most recent hemoglobin A1c 6.7. He gets most of his medical care through the [place] in [place]. He returns for reevaluation  REVIEW OF SYSTEMS: Full 14 system review of systems performed and notable only for those listed, all others  are neg:  Constitutional: neg  Cardiovascular: neg Ear/Nose/Throat: neg  Skin: neg Eyes: neg Respiratory: neg Gastroitestinal: neg  Hematology/Lymphatic: neg  Endocrine: neg Musculoskeletal:neg Allergy/Immunology: neg Neurological: neg Psychiatric: neg Sleep : neg   ALLERGIES: Allergies  Allergen Reactions  . Metformin And Related Diarrhea    HOME MEDICATIONS: Outpatient Medications Prior to Visit  Medication Sig Dispense Refill  . acetaminophen (TYLENOL) 500 MG tablet Take 1 tablet (500 mg total) by mouth every 6 (six) hours as needed for mild pain, moderate pain, fever or headache. 30 tablet 0  . albuterol (PROVENTIL HFA;VENTOLIN HFA) 108 (90 BASE) MCG/ACT inhaler Inhale 2 puffs into the lungs every 6 (six) hours as needed for wheezing.    . Aspirin-Salicylamide-Caffeine (BC HEADACHE POWDER PO) Take 1 packet by mouth daily as needed (pain).    . carvedilol (COREG) 25 MG tablet Take 25 mg by mouth 2 (two) times daily with a meal. Reported on 09/14/2015    . citalopram (CELEXA) 20  MG tablet Take 20 mg by mouth daily.    . cloNIDine (CATAPRES - DOSED IN MG/24 HR) 0.3 mg/24hr Place 1 patch onto the skin once a week. No specific day    . clopidogrel (PLAVIX) 75 MG tablet Take 1 tablet (75 mg total) by mouth at bedtime.    . fluocinonide cream (LIDEX) 6.37 % Apply 1 application topically 2 (two) times daily.    . fluticasone (FLONASE) 50 MCG/ACT nasal spray Place 2 sprays into the nose daily as needed for allergies.     [name] gabapentin (NEURONTIN) 300 MG capsule Take 300 mg by mouth daily as needed (leg pain).     . Ginkgo Biloba (GNP GINGKO BILOBA EXTRACT PO) Take 1 capsule by mouth daily.    . insulin aspart (NOVOLOG) 100 UNIT/ML injection Inject 5-15 Units into the skin 3 (three) times daily with meals. Per sliding scale    . insulin glargine (LANTUS) 100 UNIT/ML injection Inject 30 Units into the skin at bedtime.     [name] latanoprost (XALATAN) 0.005 % ophthalmic solution Place 1 drop into both eyes at bedtime.     [name] loratadine (CLARITIN) 10 MG tablet Take 10 mg by mouth daily as needed for allergies.    [name] losartan (COZAAR) 100 MG tablet Take 100 mg by mouth daily.    . Multiple Vitamins-Minerals (MULTIVITAMIN PO) Take 1 tablet by mouth daily.    . niacin 100 MG tablet Take 1 tablet (100 mg total) by mouth at bedtime. 30 tablet 0  . polyvinyl alcohol (LIQUIFILM TEARS) 1.4 % ophthalmic solution Place 1 drop into both eyes 5 (five) times daily as needed (for dry eyes).    . simvastatin (ZOCOR) 40 MG tablet Take 40 mg by mouth every evening.    . Skin Protectants, Misc. (EUCERIN) cream Apply 1 application topically daily.     [name] spironolactone (ALDACTONE) 25 MG tablet Take 25 mg by mouth daily.     No facility-administered medications prior to visit.     PAST MEDICAL HISTORY: Past Medical History:  Diagnosis Date  . Anemia   . Arthritis    "right arm, right ankle, right side" (10/30/2014)  . Colon polyps   . Diabetic neuropathy ([place])   . Diabetic retinopathy ([place])   .  Headache    "@ least 3 times/wk" (10/30/2014)  . History of blood transfusion 10/30/2014   hematochezia  . History of gout   . Hypertension   . OSA on CPAP    "suppose to  wear mask; I've got a call in for an equipment change" (10/30/2014)  . PTSD (post-traumatic stress disorder)    "service related"  . Stroke [facility]) 2014   left extremity deficits; facial left  . Type II diabetes mellitus ([place])     PAST SURGICAL HISTORY: Past Surgical History:  Procedure Laterality Date  . BONE GRAFT HIP ILIAC CREST Left ~ 1976  . CATARACT EXTRACTION W/ INTRAOCULAR LENS  IMPLANT, BILATERAL Bilateral 2014-2015  . EP IMPLANTABLE DEVICE N/A 12/24/2015   Procedure: Loop Recorder Insertion;  Surgeon: Thompson Grayer, MD;  Location: [facility];  Service: Cardiovascular;  Laterality: N/A;  . TUMOR REMOVAL Right ~ 1976   "arm; had to take bone left hip to add to the repair"    FAMILY HISTORY: Family History  Problem Relation Age of Onset  . Diabetes Mother   . Breast cancer Mother   . Heart attack Mother     CABG - Age 70  . Stroke Brother   . Lung disease Father   . Neurofibromatosis Maternal Uncle     SOCIAL HISTORY: Social History   Social History  . Marital status: Married    Spouse name: sheila  . Number of children: 4  . Years of education: 93   Occupational History  . Not on file.   Social History Main Topics  . Smoking status: Never Smoker  . Smokeless tobacco: Never Used  . Alcohol use 0.6 oz/week    1 Cans of beer per week     Comment: 10/30/2014 "might have a beer a couple times/yr"  . Drug use: No  . Sexual activity: Yes   Other Topics Concern  . Not on file   Social History Narrative   Patient is married with 4 children   Patient is right handed   Patient has a Water quality scientist degree    Patient 4 cups daily     PHYSICAL EXAM  Vitals:   06/15/16 1129  BP: (!) 167/90  Pulse: 67  Weight: 231 lb 12.8 oz (105.1 kg)  Height: 6\' 1"  (1.854 m)   Body mass index is  30.58 kg/m. General: well developed, well nourished, african american male seated, in no evident distress  Head: head normocephalic and atraumatic.   Neck: supple with no carotid or supraclavicular bruits  Cardiovascular: regular rate and rhythm, no murmurs  Musculoskeletal: no deformity  Skin: no rash/petichiae  Vascular: Normal pulses all extremities    Neurological examination  Mental Status: Awake and fully alert. Oriented to place and time.  Attention span, concentration and fund of knowledge appropriate. Mood and affect appropriate.  Cranial Nerves: Pupils equal, briskly reactive to light. Extraocular movements full without nystagmus. Visual fields full to confrontation. Hearing intact. Facial sensation intact. Face, tongue, palate moves normally and symmetrically.  Motor: Normal bulk and tone. Normal strength in all tested extremity muscles.diminished fine finger movements on left and orbits right over left upper extremity.  Sensory.: intact to tough and pinprick and vibratory.  Coordination: Rapid alternating movements normal in all extremities. Finger-to-nose and heel-to-shin performed accurately bilaterally.  Gait and Station: Arises from chair without difficulty. Stance is normal. Gait demonstrates normal stride length, with some stiffness of the right leg.  Poor arm swing on the left. Unable to heel, toe walk without difficulty. Tandem unsteady. Reflexes: 1+ and symmetric. Toes downgoing.   DIAGNOSTIC DATA (LABS, IMAGING, TESTING) - I reviewed patient records, labs, notes, testing and imaging myself where available.  Lab Results  Component Value  Date   WBC 4.5 12/23/2015   HGB 10.9 (L) 12/23/2015   HCT 31.3 (L) 12/23/2015   MCV 85.5 12/23/2015   PLT 156 12/23/2015      Component Value Date/Time   NA 143 12/23/2015 0424   K 3.2 (L) 12/23/2015 0424   CL 106 12/23/2015 0424   CO2 28 12/23/2015 0424   GLUCOSE 136 (H) 12/23/2015 0424   BUN 21 (H) 12/23/2015 0424    CREATININE 1.89 (H) 12/23/2015 0424   CALCIUM 9.0 12/23/2015 0424   PROT 7.2 12/22/2015 1231   ALBUMIN 3.7 12/22/2015 1231   AST 27 12/22/2015 1231   ALT 22 12/22/2015 1231   ALKPHOS 63 12/22/2015 1231   BILITOT 0.6 12/22/2015 1231   GFRNONAA 35 (L) 12/23/2015 0424   GFRAA 40 (L) 12/23/2015 0424   Lab Results  Component Value Date   CHOL 215 (H) 09/15/2015   HDL 58 09/15/2015   LDLCALC 137 (H) 09/15/2015   TRIG 99 09/15/2015   CHOLHDL 3.7 09/15/2015   Lab Results  Component Value Date   HGBA1C 7.9 (H) 12/22/2015   No results found for: EEFEOFHQ19 Lab Results  Component Value Date   TSH 0.739 12/22/2015      ASSESSMENT AND PLAN 70 year old AA male with bilateral corpus callosal diffusion positive lesion in June 2014- 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 Recent admission in January 2017 due to a TIA. Vascular risk factors of HTN, Sleep Apnea and Diabetes.  [name]     PLAN: Continue Plavix for secondary stroke prevention Strict control of hypertension with blood pressure goal below 130/90 continue blood pressure medications Hemoglobin A1c below 6.5 most recent 6.7 continue diabetic medications LDL cholesterol below 70 Use cane at all times for safe ambulation Patient to have elective cervical spine surgery and it is recommended he discontinue  Plavix for 5 days prior to the procedure and resume after the procedure. Follow-up in 6-8 months Dennie Bible, [facility], [facility], APRN  [facility] [address], [place], [place] [address] [phone]

## 2016-06-15 NOTE — Progress Notes (Signed)
Carelink summary report received. Battery status OK. Normal device function. No new symptom episodes, tachy episodes, brady, or pause episodes. No new AF episodes. Monthly summary reports and ROV/PRN 

## 2016-06-15 NOTE — Patient Instructions (Addendum)
Continue Plavix for secondary stroke prevention Strict control of hypertension with blood pressure goal below 130/90 continue blood pressure medications Hemoglobin A1c below 6.5 most recent 6.7 continue diabetic medications LDL cholesterol below 70 Use cane at all times for safe ambulation Patient to have elective cervical spine surgery and it is recommended he discontinue  Plavix for 5 days prior to the procedure and resume after the procedure. Follow-up in 6-8 months

## 2016-06-21 ENCOUNTER — Ambulatory Visit (INDEPENDENT_AMBULATORY_CARE_PROVIDER_SITE_OTHER): Payer: Medicare Other | Admitting: *Deleted

## 2016-06-21 DIAGNOSIS — Z95818 Presence of other cardiac implants and grafts: Secondary | ICD-10-CM | POA: Diagnosis not present

## 2016-06-21 NOTE — Progress Notes (Signed)
Carelink Summary Report / Loop Recorder 

## 2016-06-22 NOTE — Progress Notes (Signed)
I agree with the above plan 

## 2016-07-21 ENCOUNTER — Ambulatory Visit (INDEPENDENT_AMBULATORY_CARE_PROVIDER_SITE_OTHER): Payer: Medicare Other | Admitting: *Deleted

## 2016-07-21 DIAGNOSIS — Z95818 Presence of other cardiac implants and grafts: Secondary | ICD-10-CM | POA: Diagnosis not present

## 2016-07-21 NOTE — Progress Notes (Signed)
Carelink Summary Report / Loop Recorder 

## 2016-07-22 LAB — CUP PACEART REMOTE DEVICE CHECK
Date Time Interrogation Session: 20171018144527
MDC IDC PG IMPLANT DT: 20170421

## 2016-07-22 NOTE — Progress Notes (Signed)
Carelink summary report received. Battery status OK. Normal device function. No new symptom episodes, tachy episodes, brady, or pause episodes. No new AF episodes. Monthly summary reports and ROV/PRN 

## 2016-07-25 ENCOUNTER — Encounter (HOSPITAL_COMMUNITY): Payer: Self-pay

## 2016-07-25 ENCOUNTER — Emergency Department (HOSPITAL_COMMUNITY)
Admission: EM | Admit: 2016-07-25 | Discharge: 2016-07-25 | Disposition: A | Discharge status: Home / Self Care | Payer: Medicare Other | Attending: Emergency Medicine | Admitting: Emergency Medicine

## 2016-07-25 ENCOUNTER — Emergency Department (HOSPITAL_COMMUNITY): Payer: Medicare Other

## 2016-07-25 DIAGNOSIS — Z8673 Personal history of transient ischemic attack (TIA), and cerebral infarction without residual deficits: Secondary | ICD-10-CM | POA: Insufficient documentation

## 2016-07-25 DIAGNOSIS — Z7982 Long term (current) use of aspirin: Secondary | ICD-10-CM | POA: Diagnosis not present

## 2016-07-25 DIAGNOSIS — E11319 Type 2 diabetes mellitus with unspecified diabetic retinopathy without macular edema: Secondary | ICD-10-CM | POA: Insufficient documentation

## 2016-07-25 DIAGNOSIS — R55 Syncope and collapse: Secondary | ICD-10-CM | POA: Insufficient documentation

## 2016-07-25 DIAGNOSIS — E1122 Type 2 diabetes mellitus with diabetic chronic kidney disease: Secondary | ICD-10-CM | POA: Diagnosis not present

## 2016-07-25 DIAGNOSIS — N183 Chronic kidney disease, stage 3 (moderate): Secondary | ICD-10-CM | POA: Insufficient documentation

## 2016-07-25 DIAGNOSIS — I129 Hypertensive chronic kidney disease with stage 1 through stage 4 chronic kidney disease, or unspecified chronic kidney disease: Secondary | ICD-10-CM | POA: Insufficient documentation

## 2016-07-25 DIAGNOSIS — Z794 Long term (current) use of insulin: Secondary | ICD-10-CM | POA: Diagnosis not present

## 2016-07-25 DIAGNOSIS — E1140 Type 2 diabetes mellitus with diabetic neuropathy, unspecified: Secondary | ICD-10-CM | POA: Insufficient documentation

## 2016-07-25 LAB — I-STAT CHEM 8, ED
BUN: 18 mg/dL (ref 6–20)
CHLORIDE: 100 mmol/L — AB (ref 101–111)
Calcium, Ion: 1.19 mmol/L (ref 1.15–1.40)
Creatinine, Ser: 1.8 mg/dL — ABNORMAL HIGH (ref 0.61–1.24)
Glucose, Bld: 144 mg/dL — ABNORMAL HIGH (ref 65–99)
HEMATOCRIT: 33 % — AB (ref 39.0–52.0)
Hemoglobin: 11.2 g/dL — ABNORMAL LOW (ref 13.0–17.0)
Potassium: 3.4 mmol/L — ABNORMAL LOW (ref 3.5–5.1)
SODIUM: 139 mmol/L (ref 135–145)
TCO2: 27 mmol/L (ref 0–100)

## 2016-07-25 MED ORDER — SODIUM CHLORIDE 0.9 % IV BOLUS (SEPSIS)
500.0000 mL | Freq: Once | INTRAVENOUS | Status: AC
  Administered 2016-07-25: 500 mL via INTRAVENOUS

## 2016-07-25 MED ORDER — ACETAMINOPHEN 500 MG PO TABS
1000.0000 mg | ORAL_TABLET | Freq: Once | ORAL | Status: AC
  Administered 2016-07-25: 1000 mg via ORAL
  Filled 2016-07-25: qty 2

## 2016-07-25 NOTE — ED Triage Notes (Signed)
Pt arrives a/a orient with HTN noted CBG good

## 2016-07-25 NOTE — ED Provider Notes (Signed)
[facility] DEPT Provider Note   CSN: [id number] Arrival date & time: 07/25/16  1506     History   Chief Complaint Chief Complaint  Patient presents with  . Loss of Consciousness    while standing in line he had a syncopal episode that lasted one moniute CBG 121 a/a orient upon EMS arrival     HPI Cody Silva is a 70 y.o. male.  70 yo M with cc of syncope.  Just was at the dentist and standing waiting in line.  Sudden felt like the world was closing in on him and passed out.  [place] and thinks hit his head, having a right parietal headache.  Pain goes down into his neck.  Prior to the event denies chest pain, sob.  Denies hx of heart failure.  Previously had significant cardiac and neuro workup for syncopal episodes in the past with no known etiology.     The history is provided by the patient.  Loss of Consciousness   This is a recurrent problem. The current episode started 3 to 5 hours ago. The problem occurs rarely. The problem has not changed since onset.He lost consciousness for a period of less than one minute. The problem is associated with normal activity. Pertinent negatives include abdominal pain, chest pain, confusion, congestion, fever, headaches, palpitations and vomiting. He has tried nothing for the symptoms. The treatment provided no relief.    Past Medical History:  Diagnosis Date  . Anemia   . Arthritis    "right arm, right ankle, right side" (10/30/2014)  . Colon polyps   . Diabetic neuropathy ([place])   . Diabetic retinopathy ([place])   . Headache    "@ least 3 times/wk" (10/30/2014)  . History of blood transfusion 10/30/2014   hematochezia  . History of gout   . Hypertension   . OSA on CPAP    "suppose to wear mask; I've got a call in for an equipment change" (10/30/2014)  . PTSD (post-traumatic stress disorder)    "service related"  . Stroke [facility]) 2014   left extremity deficits; facial left  . Type II diabetes mellitus [facility])     Patient Active  Problem List   Diagnosis Date Noted  . H/O agent Orange exposure 12/24/2015  . Type 2 diabetes with nephropathy ([place])   . Near syncope 12/22/2015  . History of TIAs 09/14/2015  . CKD stage 3 due to type 2 diabetes mellitus ([place]) 09/14/2015  . Acute lower GI bleeding 10/30/2014  . Orthostatic hypotension 10/30/2014  . History of colonic polyps 10/30/2014  . Depression 10/30/2014  . Symptomatic anemia 10/30/2014  . AKI (acute kidney injury) ([place]) 10/30/2014  . Ataxic gait 09/21/2014  . History of CVA (cerebrovascular accident) 02/22/2013  . Abnormal brain scan 02/21/2013  . Dizziness 02/21/2013  . Weakness 02/21/2013  . Diabetes mellitus ([place]) 02/21/2013  . Hypertension 02/21/2013    Past Surgical History:  Procedure Laterality Date  . BONE GRAFT HIP ILIAC CREST Left ~ 1976  . CATARACT EXTRACTION W/ INTRAOCULAR LENS  IMPLANT, BILATERAL Bilateral 2014-2015  . EP IMPLANTABLE DEVICE N/A 12/24/2015   Procedure: Loop Recorder Insertion;  Surgeon: Thompson Grayer, MD;  Location: [facility];  Service: Cardiovascular;  Laterality: N/A;  . TUMOR REMOVAL Right ~ 1976   "arm; had to take bone left hip to add to the repair"       Home Medications    Prior to Admission medications   Medication Sig Start Date End Date  Taking? Authorizing Provider  acetaminophen (TYLENOL) 500 MG tablet Take 1 tablet (500 mg total) by mouth every 6 (six) hours as needed for mild pain, moderate pain, fever or headache. 11/01/14   Juluis Mire, MD  albuterol (PROVENTIL HFA;VENTOLIN HFA) 108 (90 BASE) MCG/ACT inhaler Inhale 2 puffs into the lungs every 6 (six) hours as needed for wheezing.    Historical Provider, MD  amLODipine (NORVASC) 1 mg/mL SUSP oral suspension Take by mouth daily.    Historical Provider, MD  Aspirin-Salicylamide-Caffeine (BC HEADACHE POWDER PO) Take 1 packet by mouth daily as needed (pain).    Historical Provider, MD  carvedilol (COREG) 25 MG tablet Take 25 mg by mouth 2 (two) times  daily with a meal. Reported on 09/14/2015    Historical Provider, MD  citalopram (CELEXA) 20 MG tablet Take 20 mg by mouth daily.    Historical Provider, MD  cloNIDine (CATAPRES - DOSED IN MG/24 HR) 0.3 mg/24hr Place 1 patch onto the skin once a week. No specific day    Historical Provider, MD  clopidogrel (PLAVIX) 75 MG tablet Take 1 tablet (75 mg total) by mouth at bedtime. 11/08/14   Juluis Mire, MD  fluocinonide cream (LIDEX) 1.93 % Apply 1 application topically 2 (two) times daily.    Historical Provider, MD  fluticasone (FLONASE) 50 MCG/ACT nasal spray Place 2 sprays into the nose daily as needed for allergies.     Historical Provider, MD  gabapentin (NEURONTIN) 300 MG capsule Take 300 mg by mouth daily as needed (leg pain).     Historical Provider, MD  Ginkgo Biloba (GNP GINGKO BILOBA EXTRACT PO) Take 1 capsule by mouth daily.    Historical Provider, MD  insulin aspart (NOVOLOG) 100 UNIT/ML injection Inject 5-15 Units into the skin 3 (three) times daily with meals. Per sliding scale    Historical Provider, MD  insulin glargine (LANTUS) 100 UNIT/ML injection Inject 30 Units into the skin at bedtime.     Historical Provider, MD  latanoprost (XALATAN) 0.005 % ophthalmic solution Place 1 drop into both eyes at bedtime.     Historical Provider, MD  loratadine (CLARITIN) 10 MG tablet Take 10 mg by mouth daily as needed for allergies.    Historical Provider, MD  losartan (COZAAR) 100 MG tablet Take 100 mg by mouth daily.    Historical Provider, MD  Multiple Vitamins-Minerals (MULTIVITAMIN PO) Take 1 tablet by mouth daily.    Historical Provider, MD  niacin 100 MG tablet Take 1 tablet (100 mg total) by mouth at bedtime. 09/16/15   Thurnell Lose, MD  polyvinyl alcohol (LIQUIFILM TEARS) 1.4 % ophthalmic solution Place 1 drop into both eyes 5 (five) times daily as needed (for dry eyes).    Historical Provider, MD  simvastatin (ZOCOR) 40 MG tablet Take 40 mg by mouth every evening.    Historical  Provider, MD  Skin Protectants, Misc. (EUCERIN) cream Apply 1 application topically daily.     Historical Provider, MD  spironolactone (ALDACTONE) 25 MG tablet Take 25 mg by mouth daily.    Historical Provider, MD  UNKNOWN TO PATIENT Amlodipine for Bp, not sure of mg   (daily)    Historical Provider, MD  UNKNOWN TO PATIENT Other eye drop, unknown name  (received from [place] Provider, MD    Family History Family History  Problem Relation Age of Onset  . Diabetes Mother   . Breast cancer Mother   . Heart attack Mother     CABG -  Age 69  . Stroke Brother   . Lung disease Father   . Neurofibromatosis Maternal Uncle     Social History Social History  Substance Use Topics  . Smoking status: Never Smoker  . Smokeless tobacco: Never Used  . Alcohol use 0.6 oz/week    1 Cans of beer per week     Comment: 10/30/2014 "might have a beer a couple times/yr"     Allergies   Metformin and related   Review of Systems Review of Systems  Constitutional: Negative for chills and fever.  HENT: Negative for congestion and facial swelling.   Eyes: Negative for discharge and visual disturbance.  Respiratory: Negative for shortness of breath.   Cardiovascular: Positive for syncope. Negative for chest pain and palpitations.  Gastrointestinal: Negative for abdominal pain, diarrhea and vomiting.  Musculoskeletal: Negative for arthralgias and myalgias.  Skin: Negative for color change and rash.  Neurological: Positive for syncope. Negative for tremors and headaches.  Psychiatric/Behavioral: Negative for confusion and dysphoric mood.     Physical Exam Updated Vital Signs BP 168/87 (BP Location: Right Arm)   Pulse 90   Temp 97.9 F (36.6 C) (Oral)   Resp 17   Ht 5\' 10"  (1.778 m)   SpO2 99%   Physical Exam  Constitutional: He is oriented to person, place, and time. He appears well-developed and well-nourished.  HENT:  Head: Normocephalic and atraumatic.  Eyes: EOM are normal.  Pupils are equal, round, and reactive to light.  Neck: Normal range of motion. Neck supple. No JVD present.  Cardiovascular: Normal rate and regular rhythm.  Exam reveals no gallop and no friction rub.   No murmur heard. Pulmonary/Chest: No respiratory distress. He has no wheezes.  Abdominal: He exhibits no distension. There is no rebound and no guarding.  Musculoskeletal: Normal range of motion.  Neurological: He is alert and oriented to person, place, and time.  Skin: No rash noted. No pallor.  Psychiatric: He has a normal mood and affect. His behavior is normal.  Nursing note and vitals reviewed.    ED Treatments / Results  Labs (all labs ordered are listed, but only abnormal results are displayed) Labs Reviewed  I-STAT CHEM 8, ED - Abnormal; Notable for the following:       Result Value   Potassium 3.4 (*)    Chloride 100 (*)    Creatinine, Ser 1.80 (*)    Glucose, Bld 144 (*)    Hemoglobin 11.2 (*)    HCT 33.0 (*)    All other components within normal limits    EKG  EKG Interpretation  Date/Time:  Tuesday July 25 2016 15:31:20 EST Ventricular Rate:  86 PR Interval:    QRS Duration: 112 QT Interval:  397 QTC Calculation: 475 R Axis:   52 Text Interpretation:  Sinus rhythm Ventricular premature complex Consider right atrial enlargement Probable anteroseptal infarct, old Minimal ST depression, lateral leads No significant change since last tracing Confirmed by Harley Mccartney MD, Quillian Quince [phone]) on 07/25/2016 4:03:22 PM Also confirmed by Tyrone Nine MD, DANIEL [phone]), editor [place], Joelene Millin [phone])  on 07/25/2016 4:33:04 PM       Radiology Ct Head Wo Contrast  Result Date: 07/25/2016 CLINICAL DATA:  Fall with right-sided head pain EXAM: CT HEAD WITHOUT CONTRAST CT CERVICAL SPINE WITHOUT CONTRAST TECHNIQUE: Multidetector CT imaging of the head and cervical spine was performed following the standard protocol without intravenous contrast. Multiplanar CT image reconstructions of the  cervical spine were also generated. COMPARISON:  12/22/2015, 09/13/2005  T FINDINGS: CT HEAD FINDINGS Brain: No evidence of acute infarction, hemorrhage, hydrocephalus, extra-axial collection or mass lesion/mass effect. Mild periventricular white matter hypodensities, consistent with small vessel disease. Vascular: No hyperdense vessels. No unexpected calcifications. Minimal carotid artery calcifications. Skull: Mastoid air cells clear.  No fracture identified. Sinuses/Orbits: Paranasal sinuses are grossly clear. No acute orbital abnormality. Other: None CT CERVICAL SPINE FINDINGS Alignment: Mild reversal of cervical lordosis. Facet alignment is maintained. Skull base and vertebrae: Craniovertebral junction appears intact. The vertebral body heights are maintained. There is no fracture identified. Soft tissues and spinal canal: No prevertebral fluid or swelling. No visible canal hematoma. Disc levels: Mild degenerative changes are present at C4-C5, C5-C6, C6-C7 and C7-T1. Suggestion of small disc protrusions at C2-C3, C3-C4, C4-C5. Mild bilateral facet arthropathy. No significant foraminal stenosis. Upper chest: Lung apices clear. Thyroid gland demonstrates a tiny 6 mm nodule at the posterior lower pole on the right. Other: None IMPRESSION: 1. No CT evidence for acute intracranial abnormality. Mild periventricular white matter hypodensities consistent with small vessel disease. 2. Mild reversal of cervical lordosis. No acute fracture or malalignment. Electronically Signed   By: Donavan Foil M.D.   On: 07/25/2016 17:31   Ct Cervical Spine Wo Contrast  Result Date: 07/25/2016 CLINICAL DATA:  Fall with right-sided head pain EXAM: CT HEAD WITHOUT CONTRAST CT CERVICAL SPINE WITHOUT CONTRAST TECHNIQUE: Multidetector CT imaging of the head and cervical spine was performed following the standard protocol without intravenous contrast. Multiplanar CT image reconstructions of the cervical spine were also generated.  COMPARISON:  12/22/2015, 09/13/2005 T FINDINGS: CT HEAD FINDINGS Brain: No evidence of acute infarction, hemorrhage, hydrocephalus, extra-axial collection or mass lesion/mass effect. Mild periventricular white matter hypodensities, consistent with small vessel disease. Vascular: No hyperdense vessels. No unexpected calcifications. Minimal carotid artery calcifications. Skull: Mastoid air cells clear.  No fracture identified. Sinuses/Orbits: Paranasal sinuses are grossly clear. No acute orbital abnormality. Other: None CT CERVICAL SPINE FINDINGS Alignment: Mild reversal of cervical lordosis. Facet alignment is maintained. Skull base and vertebrae: Craniovertebral junction appears intact. The vertebral body heights are maintained. There is no fracture identified. Soft tissues and spinal canal: No prevertebral fluid or swelling. No visible canal hematoma. Disc levels: Mild degenerative changes are present at C4-C5, C5-C6, C6-C7 and C7-T1. Suggestion of small disc protrusions at C2-C3, C3-C4, C4-C5. Mild bilateral facet arthropathy. No significant foraminal stenosis. Upper chest: Lung apices clear. Thyroid gland demonstrates a tiny 6 mm nodule at the posterior lower pole on the right. Other: None IMPRESSION: 1. No CT evidence for acute intracranial abnormality. Mild periventricular white matter hypodensities consistent with small vessel disease. 2. Mild reversal of cervical lordosis. No acute fracture or malalignment. Electronically Signed   By: Donavan Foil M.D.   On: 07/25/2016 17:31    Procedures Procedures (including critical care time)  Medications Ordered in ED Medications  sodium chloride 0.9 % bolus 500 mL (0 mLs Intravenous Stopped 07/25/16 1747)  acetaminophen (TYLENOL) tablet 1,000 mg (1,000 mg Oral Given 07/25/16 1540)     Initial Impression / Assessment and Plan / ED Course  I have reviewed the triage vital signs and the nursing notes.  Pertinent labs & imaging results that were available  during my care of the patient were reviewed by me and considered in my medical decision making (see chart for details).  Clinical Course     70 yo M with syncopal event.  Right sided headache.  Ordered CT head and c spine post fall.  ECG with  no concerning findings, basic labs without anemia or electrolyte abnormality.  Patient feeling better post tylenol.  Awaiting imaging, see no need for impatient work up if negative.   The patients results and plan were reviewed and discussed.   Any x-rays performed were independently reviewed by myself.   Differential diagnosis were considered with the presenting HPI.  Medications  sodium chloride 0.9 % bolus 500 mL (0 mLs Intravenous Stopped 07/25/16 1747)  acetaminophen (TYLENOL) tablet 1,000 mg (1,000 mg Oral Given 07/25/16 1540)    Vitals:   07/25/16 1535 07/25/16 1730 07/25/16 1745 07/25/16 1746  BP: 196/91 161/95  168/87  Pulse: 85  92 90  Resp: 17 19 15 17   Temp: 97.9 F (36.6 C)     TempSrc: Oral     SpO2: 98%  100% 99%  Height:        Final diagnoses:  Syncope and collapse    Final Clinical Impressions(s) / ED Diagnoses   Final diagnoses:  Syncope and collapse    New Prescriptions Discharge Medication List as of 07/25/2016  6:12 PM       Deno Etienne, DO 07/25/16 2113

## 2016-07-25 NOTE — ED Provider Notes (Signed)
Pt seen by Dr Tyrone Nine.  Please see his note for details.  Sign out to follow up on CT scans.  No acute findings noted on CT.  Discussed findings with patient.  Feels well and ready to go home.   Dorie Rank, MD 07/25/16 [phone]

## 2016-07-25 NOTE — Discharge Instructions (Signed)
Follow up with your doctor to be rechecked in the next week. Return for recurrent symptoms

## 2016-07-25 NOTE — ED Notes (Signed)
Patient transported to CT 

## 2016-08-20 IMAGING — CT CT HEAD W/O CM
2 series · 16 of 30 positions shown, 19 images · non-contrast
Comparison: Brain MRI and MRA 09/14/2015 and earlier.

CLINICAL DATA: 69-year-old male with syncopal episode today.
Initial encounter.

EXAM:
CT HEAD WITHOUT CONTRAST
TECHNIQUE: Contiguous axial images were obtained from the base of the skull
through the vertex without intravenous contrast.

[Series 2: head w/o · axial · non-contrast · 0.45mm/px · z∈[-34,+76]mm · 9 of 28 slices shown, 12 images]
[im 3/28  brain]
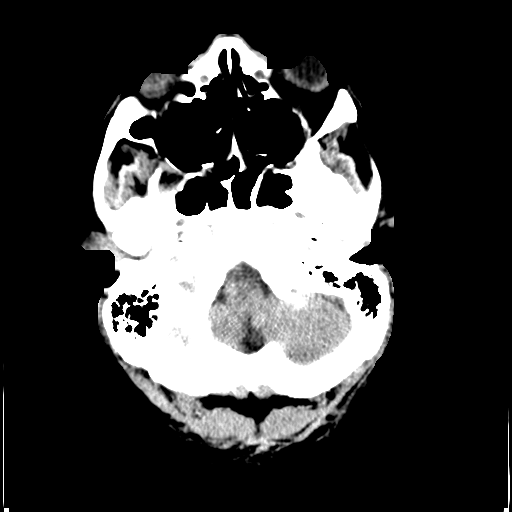
[im 3/28  bone]
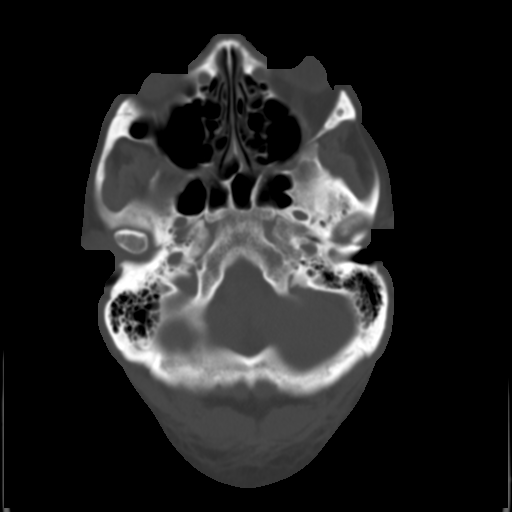
[im 6/28  brain]
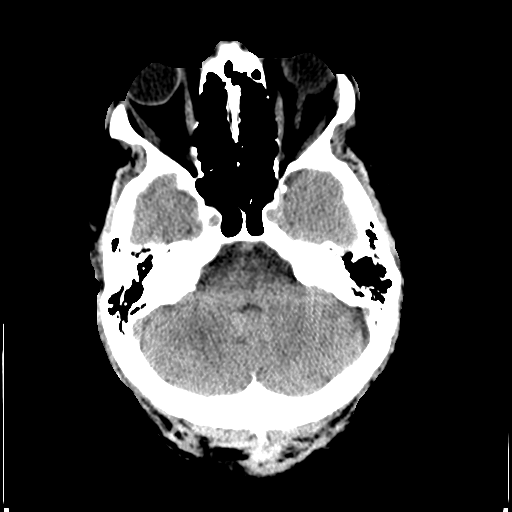
[im 9/28  brain]
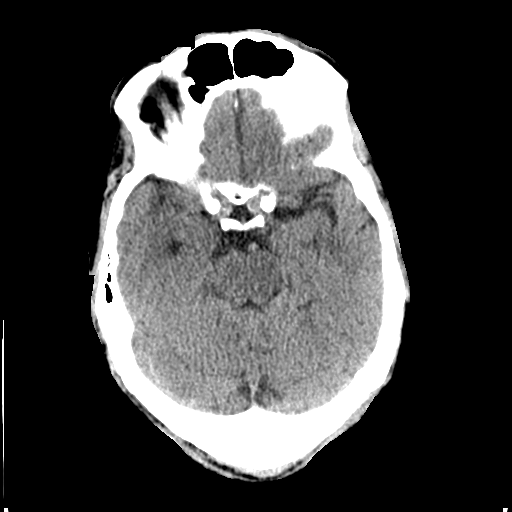
[im 11/28  brain]
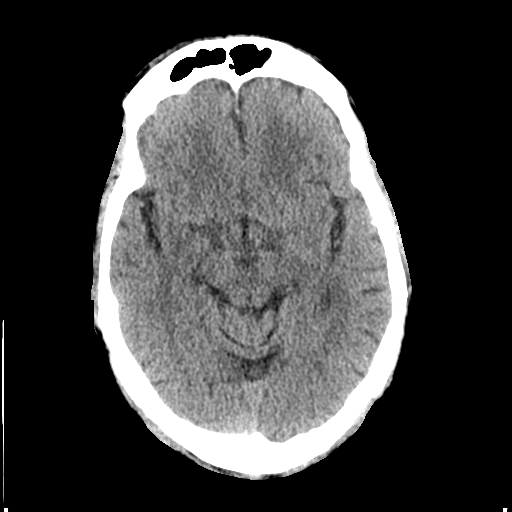
[im 14/28  brain]
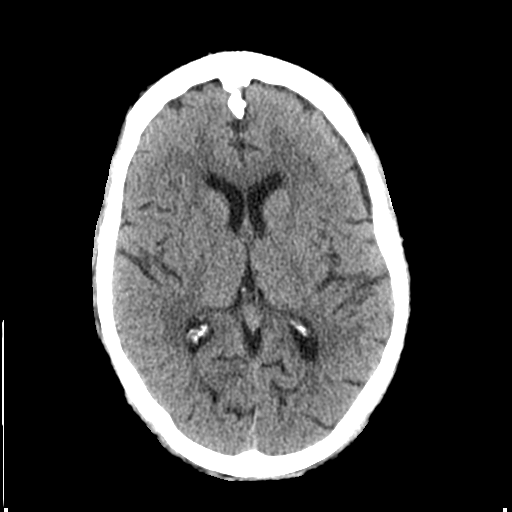
[im 14/28  bone]
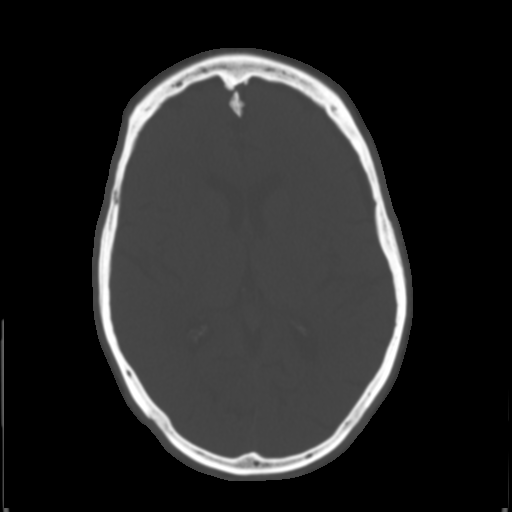
[im 17/28  brain]
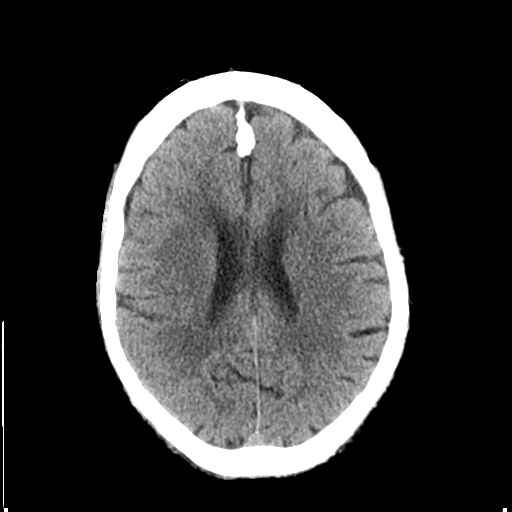
[im 19/28  brain]
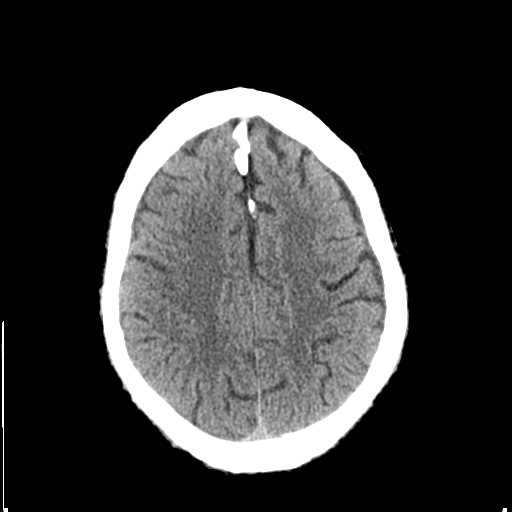
[im 22/28  brain]
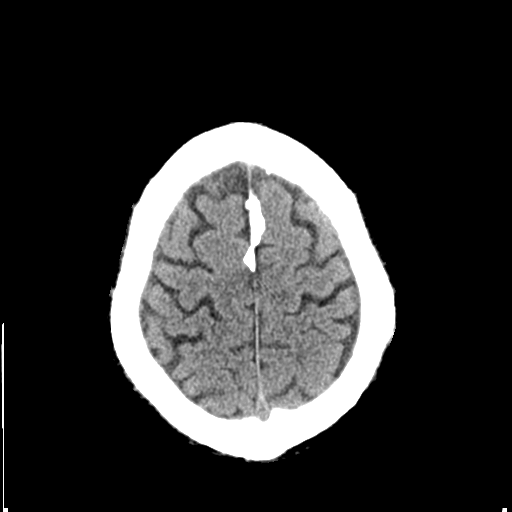
[im 25/28  brain]
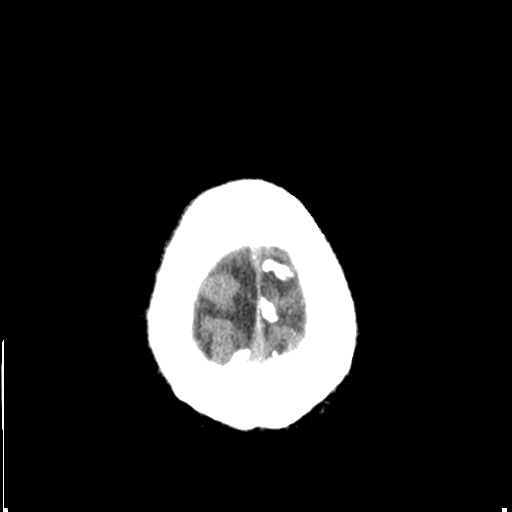
[im 25/28  bone]
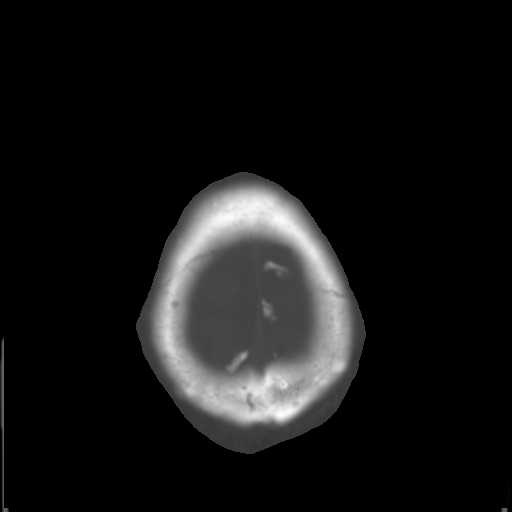

[Series 3: bone windows · axial · 0.48mm/px · z∈[-27,+63]mm · 7 of 46 slices shown]
[im 6/46  bone]
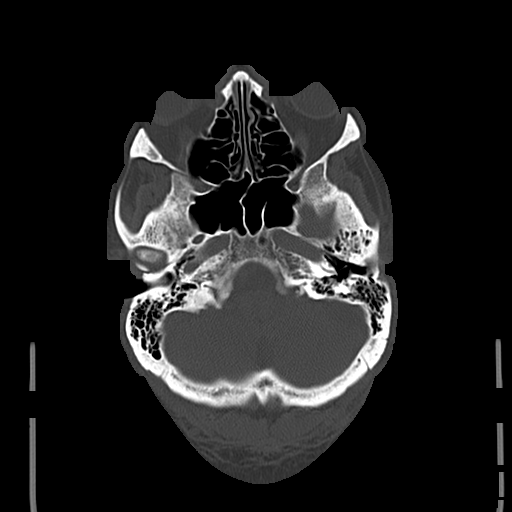
[im 11/46  bone]
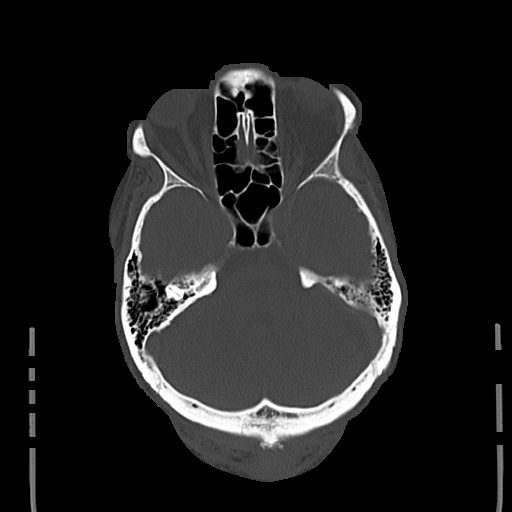
[im 16/46  bone]
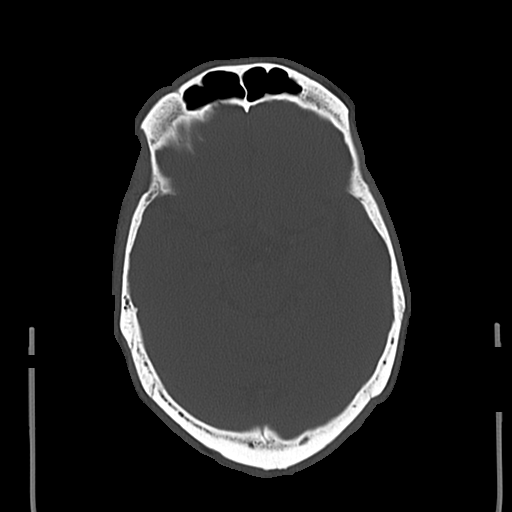
[im 21/46  bone]
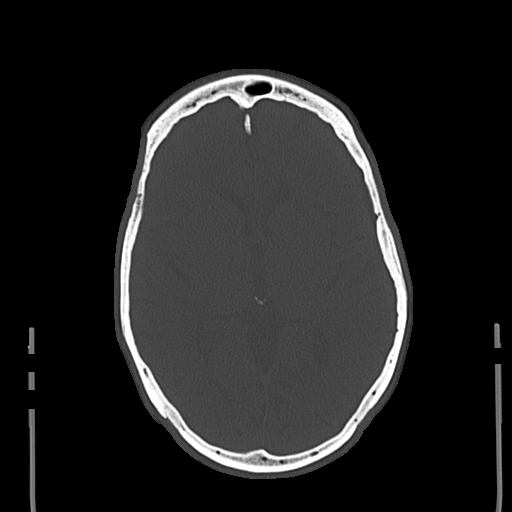
[im 26/46  bone]
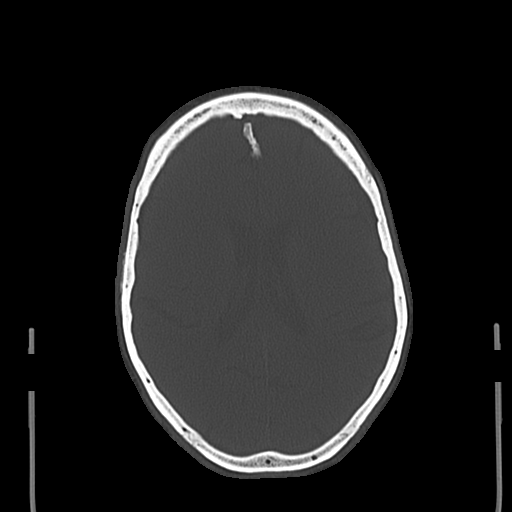
[im 31/46  bone]
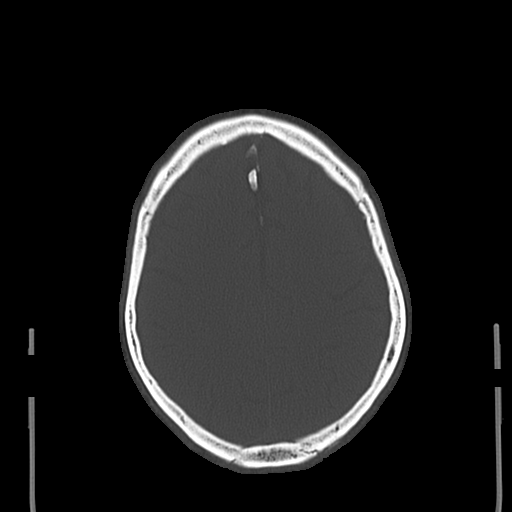
[im 36/46  bone]
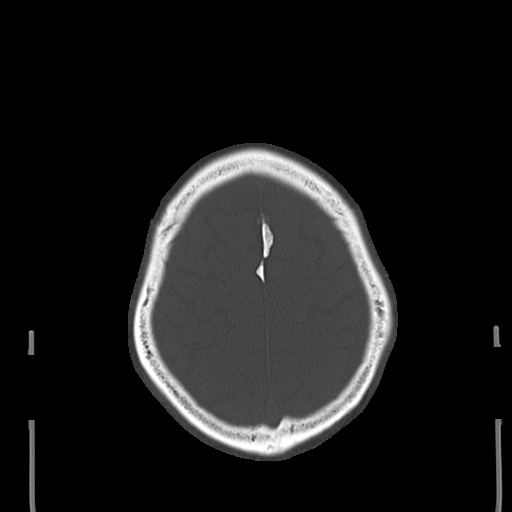

[16 of 30 positions shown; findings below may reference images not displayed]

FINDINGS: Visualized paranasal sinuses and mastoids are clear. Stable scalp
soft tissues, no scalp hematoma identified. Stable orbits soft
tissues. No acute osseous abnormality identified.

Mild Calcified atherosclerosis at the skull base. Mild intracranial
artery dolichoectasia. Cerebral volume is within normal limits for
age. Scattered dural calcifications. No midline shift,
ventriculomegaly, mass effect, evidence of mass lesion, intracranial
hemorrhage or evidence of cortically based acute infarction.
Gray-white matter differentiation is within normal limits throughout
the brain.
IMPRESSION: Stable and negative for age non contrast CT appearance of the brain.

## 2016-08-21 ENCOUNTER — Ambulatory Visit (INDEPENDENT_AMBULATORY_CARE_PROVIDER_SITE_OTHER): Payer: Medicare Other | Admitting: *Deleted

## 2016-08-21 DIAGNOSIS — Z95818 Presence of other cardiac implants and grafts: Secondary | ICD-10-CM

## 2016-08-21 NOTE — Progress Notes (Signed)
Carelink Summary Report / Loop Recorder 

## 2016-08-31 LAB — CUP PACEART REMOTE DEVICE CHECK
Date Time Interrogation Session: 20171117153938
MDC IDC PG IMPLANT DT: 20170421

## 2016-09-10 ENCOUNTER — Emergency Department (HOSPITAL_COMMUNITY): Payer: Medicare Other

## 2016-09-10 ENCOUNTER — Encounter (HOSPITAL_COMMUNITY): Payer: Self-pay

## 2016-09-10 ENCOUNTER — Inpatient Hospital Stay (HOSPITAL_COMMUNITY)
Admission: EM | Admit: 2016-09-10 | Discharge: 2016-09-12 | DRG: 312 | Disposition: A | Discharge status: Home Health | Payer: Medicare Other | Attending: Internal Medicine | Admitting: Internal Medicine

## 2016-09-10 DIAGNOSIS — Z7902 Long term (current) use of antithrombotics/antiplatelets: Secondary | ICD-10-CM

## 2016-09-10 DIAGNOSIS — L97519 Non-pressure chronic ulcer of other part of right foot with unspecified severity: Secondary | ICD-10-CM | POA: Diagnosis present

## 2016-09-10 DIAGNOSIS — N183 Chronic kidney disease, stage 3 (moderate): Secondary | ICD-10-CM | POA: Diagnosis present

## 2016-09-10 DIAGNOSIS — Z836 Family history of other diseases of the respiratory system: Secondary | ICD-10-CM

## 2016-09-10 DIAGNOSIS — Z823 Family history of stroke: Secondary | ICD-10-CM

## 2016-09-10 DIAGNOSIS — E785 Hyperlipidemia, unspecified: Secondary | ICD-10-CM | POA: Diagnosis present

## 2016-09-10 DIAGNOSIS — D696 Thrombocytopenia, unspecified: Secondary | ICD-10-CM | POA: Diagnosis present

## 2016-09-10 DIAGNOSIS — M19071 Primary osteoarthritis, right ankle and foot: Secondary | ICD-10-CM | POA: Diagnosis present

## 2016-09-10 DIAGNOSIS — E1122 Type 2 diabetes mellitus with diabetic chronic kidney disease: Secondary | ICD-10-CM | POA: Diagnosis present

## 2016-09-10 DIAGNOSIS — Z9119 Patient's noncompliance with other medical treatment and regimen: Secondary | ICD-10-CM

## 2016-09-10 DIAGNOSIS — E1159 Type 2 diabetes mellitus with other circulatory complications: Secondary | ICD-10-CM | POA: Diagnosis present

## 2016-09-10 DIAGNOSIS — E11319 Type 2 diabetes mellitus with unspecified diabetic retinopathy without macular edema: Secondary | ICD-10-CM | POA: Diagnosis present

## 2016-09-10 DIAGNOSIS — Z888 Allergy status to other drugs, medicaments and biological substances status: Secondary | ICD-10-CM

## 2016-09-10 DIAGNOSIS — Z7951 Long term (current) use of inhaled steroids: Secondary | ICD-10-CM

## 2016-09-10 DIAGNOSIS — Z794 Long term (current) use of insulin: Secondary | ICD-10-CM

## 2016-09-10 DIAGNOSIS — Z961 Presence of intraocular lens: Secondary | ICD-10-CM | POA: Diagnosis present

## 2016-09-10 DIAGNOSIS — I10 Essential (primary) hypertension: Secondary | ICD-10-CM | POA: Diagnosis present

## 2016-09-10 DIAGNOSIS — G4733 Obstructive sleep apnea (adult) (pediatric): Secondary | ICD-10-CM

## 2016-09-10 DIAGNOSIS — R3915 Urgency of urination: Secondary | ICD-10-CM | POA: Diagnosis present

## 2016-09-10 DIAGNOSIS — R55 Syncope and collapse: Secondary | ICD-10-CM | POA: Diagnosis not present

## 2016-09-10 DIAGNOSIS — F4310 Post-traumatic stress disorder, unspecified: Secondary | ICD-10-CM | POA: Diagnosis present

## 2016-09-10 DIAGNOSIS — I129 Hypertensive chronic kidney disease with stage 1 through stage 4 chronic kidney disease, or unspecified chronic kidney disease: Secondary | ICD-10-CM | POA: Diagnosis present

## 2016-09-10 DIAGNOSIS — H409 Unspecified glaucoma: Secondary | ICD-10-CM | POA: Diagnosis present

## 2016-09-10 DIAGNOSIS — R197 Diarrhea, unspecified: Secondary | ICD-10-CM

## 2016-09-10 DIAGNOSIS — Z9841 Cataract extraction status, right eye: Secondary | ICD-10-CM

## 2016-09-10 DIAGNOSIS — I951 Orthostatic hypotension: Secondary | ICD-10-CM | POA: Diagnosis present

## 2016-09-10 DIAGNOSIS — Z6830 Body mass index (BMI) 30.0-30.9, adult: Secondary | ICD-10-CM

## 2016-09-10 DIAGNOSIS — Z833 Family history of diabetes mellitus: Secondary | ICD-10-CM

## 2016-09-10 DIAGNOSIS — R634 Abnormal weight loss: Secondary | ICD-10-CM

## 2016-09-10 DIAGNOSIS — E1140 Type 2 diabetes mellitus with diabetic neuropathy, unspecified: Secondary | ICD-10-CM | POA: Diagnosis present

## 2016-09-10 DIAGNOSIS — Z8249 Family history of ischemic heart disease and other diseases of the circulatory system: Secondary | ICD-10-CM

## 2016-09-10 DIAGNOSIS — E11621 Type 2 diabetes mellitus with foot ulcer: Secondary | ICD-10-CM | POA: Diagnosis present

## 2016-09-10 DIAGNOSIS — Z820 Family history of epilepsy and other diseases of the nervous system: Secondary | ICD-10-CM

## 2016-09-10 DIAGNOSIS — Z9842 Cataract extraction status, left eye: Secondary | ICD-10-CM

## 2016-09-10 DIAGNOSIS — I69351 Hemiplegia and hemiparesis following cerebral infarction affecting right dominant side: Secondary | ICD-10-CM

## 2016-09-10 DIAGNOSIS — Z803 Family history of malignant neoplasm of breast: Secondary | ICD-10-CM

## 2016-09-10 DIAGNOSIS — E440 Moderate protein-calorie malnutrition: Secondary | ICD-10-CM | POA: Insufficient documentation

## 2016-09-10 HISTORY — DX: Contact with and (suspected) exposure to other war theater: Z77.39

## 2016-09-10 HISTORY — DX: Contact with and (suspected) exposure to other hazardous, chiefly nonmedicinal, chemicals: Z77.098

## 2016-09-10 LAB — GLUCOSE, CAPILLARY
GLUCOSE-CAPILLARY: 178 mg/dL — AB (ref 65–99)
Glucose-Capillary: 243 mg/dL — ABNORMAL HIGH (ref 65–99)
Glucose-Capillary: 165 mg/dL — ABNORMAL HIGH (ref 65–99)

## 2016-09-10 LAB — COMPREHENSIVE METABOLIC PANEL
ALBUMIN: 3.2 g/dL — AB (ref 3.5–5.0)
ALT: 11 U/L — AB (ref 17–63)
AST: 17 U/L (ref 15–41)
Alkaline Phosphatase: 57 U/L (ref 38–126)
Anion gap: 7 (ref 5–15)
BILIRUBIN TOTAL: 0.8 mg/dL (ref 0.3–1.2)
BUN: 20 mg/dL (ref 6–20)
CALCIUM: 9.1 mg/dL (ref 8.9–10.3)
CHLORIDE: 102 mmol/L (ref 101–111)
CO2: 28 mmol/L (ref 22–32)
CREATININE: 2.02 mg/dL — AB (ref 0.61–1.24)
GFR calc Af Amer: 37 mL/min — ABNORMAL LOW (ref >60)
GFR calc non Af Amer: 32 mL/min — ABNORMAL LOW (ref >60)
GLUCOSE: 271 mg/dL — AB (ref 65–99)
POTASSIUM: 3.2 mmol/L — AB (ref 3.5–5.1)
Sodium: 137 mmol/L (ref 135–145)
Total Protein: 6.7 g/dL (ref 6.5–8.1)

## 2016-09-10 LAB — CBC
HEMATOCRIT: 33.9 % — AB (ref 39.0–52.0)
Hemoglobin: 11.5 g/dL — ABNORMAL LOW (ref 13.0–17.0)
MCH: 29.5 pg (ref 26.0–34.0)
MCHC: 33.9 g/dL (ref 30.0–36.0)
MCV: 86.9 fL (ref 78.0–100.0)
Platelets: 125 10*3/uL — ABNORMAL LOW (ref 150–400)
RBC: 3.9 MIL/uL — ABNORMAL LOW (ref 4.22–5.81)
RDW: 13.1 % (ref 11.5–15.5)
WBC: 7.1 10*3/uL (ref 4.0–10.5)

## 2016-09-10 LAB — VITAMIN B12: Vitamin B-12: 451 pg/mL (ref 180–914)

## 2016-09-10 LAB — TSH: TSH: 0.708 u[IU]/mL (ref 0.350–4.500)

## 2016-09-10 LAB — DIFFERENTIAL
BASOS ABS: 0 10*3/uL (ref 0.0–0.1)
BASOS PCT: 0 %
EOS ABS: 0 10*3/uL (ref 0.0–0.7)
Eosinophils Relative: 0 %
Lymphocytes Relative: 10 %
Lymphs Abs: 0.7 10*3/uL (ref 0.7–4.0)
MONOS PCT: 3 %
Monocytes Absolute: 0.2 10*3/uL (ref 0.1–1.0)
NEUTROS PCT: 87 %
Neutro Abs: 6.1 10*3/uL (ref 1.7–7.7)

## 2016-09-10 LAB — RAPID URINE DRUG SCREEN, HOSP PERFORMED
Amphetamines: NOT DETECTED
BARBITURATES: NOT DETECTED
Benzodiazepines: NOT DETECTED
COCAINE: NOT DETECTED
Opiates: NOT DETECTED
TETRAHYDROCANNABINOL: NOT DETECTED

## 2016-09-10 LAB — I-STAT CHEM 8, ED
BUN: 22 mg/dL — ABNORMAL HIGH (ref 6–20)
CALCIUM ION: 1.22 mmol/L (ref 1.15–1.40)
CHLORIDE: 100 mmol/L — AB (ref 101–111)
Creatinine, Ser: 2 mg/dL — ABNORMAL HIGH (ref 0.61–1.24)
GLUCOSE: 274 mg/dL — AB (ref 65–99)
HCT: 34 % — ABNORMAL LOW (ref 39.0–52.0)
HEMOGLOBIN: 11.6 g/dL — AB (ref 13.0–17.0)
Potassium: 3.2 mmol/L — ABNORMAL LOW (ref 3.5–5.1)
SODIUM: 140 mmol/L (ref 135–145)
TCO2: 29 mmol/L (ref 0–100)

## 2016-09-10 LAB — PHOSPHORUS: Phosphorus: 3 mg/dL (ref 2.5–4.6)

## 2016-09-10 LAB — MAGNESIUM: Magnesium: 1.5 mg/dL — ABNORMAL LOW (ref 1.7–2.4)

## 2016-09-10 LAB — TROPONIN I
TROPONIN I: 0.03 ng/mL — AB (ref <0.03)
Troponin I: 0.03 ng/mL (ref <0.03)

## 2016-09-10 LAB — SEDIMENTATION RATE: Sed Rate: 37 mm/hr — ABNORMAL HIGH (ref 0–16)

## 2016-09-10 LAB — PROTIME-INR
INR: 1.1
PROTHROMBIN TIME: 14.2 s (ref 11.4–15.2)

## 2016-09-10 LAB — ETHANOL

## 2016-09-10 LAB — CBG MONITORING, ED: GLUCOSE-CAPILLARY: 262 mg/dL — AB (ref 65–99)

## 2016-09-10 LAB — APTT: APTT: 28 s (ref 24–36)

## 2016-09-10 LAB — I-STAT TROPONIN, ED: TROPONIN I, POC: 0.02 ng/mL (ref 0.00–0.08)

## 2016-09-10 MED ORDER — AMLODIPINE BESYLATE 10 MG PO TABS
10.0000 mg | ORAL_TABLET | Freq: Every day | ORAL | Status: DC
  Administered 2016-09-10 – 2016-09-12 (×3): 10 mg via ORAL
  Filled 2016-09-10 (×3): qty 1

## 2016-09-10 MED ORDER — CLONIDINE HCL 0.1 MG/24HR TD PTWK
0.1000 mg | MEDICATED_PATCH | TRANSDERMAL | Status: DC
  Administered 2016-09-10: 0.1 mg via TRANSDERMAL
  Filled 2016-09-10: qty 1

## 2016-09-10 MED ORDER — SENNOSIDES-DOCUSATE SODIUM 8.6-50 MG PO TABS
1.0000 | ORAL_TABLET | Freq: Every day | ORAL | Status: DC
  Administered 2016-09-10: 1 via ORAL
  Filled 2016-09-10 (×2): qty 1

## 2016-09-10 MED ORDER — CLOPIDOGREL BISULFATE 75 MG PO TABS
75.0000 mg | ORAL_TABLET | Freq: Every day | ORAL | Status: DC
  Administered 2016-09-10 – 2016-09-11 (×2): 75 mg via ORAL
  Filled 2016-09-10 (×2): qty 1

## 2016-09-10 MED ORDER — INSULIN ASPART 100 UNIT/ML ~~LOC~~ SOLN: 0.0000 [IU] | Freq: Every day | SUBCUTANEOUS | Status: DC

## 2016-09-10 MED ORDER — CARVEDILOL 12.5 MG PO TABS
50.0000 mg | ORAL_TABLET | Freq: Two times a day (BID) | ORAL | Status: DC
  Administered 2016-09-10 – 2016-09-12 (×5): 50 mg via ORAL
  Filled 2016-09-10 (×4): qty 4
  Filled 2016-09-10: qty 8

## 2016-09-10 MED ORDER — ONDANSETRON HCL 4 MG PO TABS: 4.0000 mg | ORAL_TABLET | Freq: Four times a day (QID) | ORAL | Status: DC | PRN

## 2016-09-10 MED ORDER — SIMVASTATIN 40 MG PO TABS: 40.0000 mg | ORAL_TABLET | Freq: Every evening | ORAL | Status: DC

## 2016-09-10 MED ORDER — ONDANSETRON HCL 4 MG/2ML IJ SOLN
4.0000 mg | Freq: Four times a day (QID) | INTRAMUSCULAR | Status: DC | PRN
Start: 2016-09-10 — End: 2016-09-12

## 2016-09-10 MED ORDER — HEPARIN SODIUM (PORCINE) 5000 UNIT/ML IJ SOLN
5000.0000 [IU] | Freq: Three times a day (TID) | INTRAMUSCULAR | Status: DC
  Administered 2016-09-10 – 2016-09-12 (×5): 5000 [IU] via SUBCUTANEOUS
  Filled 2016-09-10 (×5): qty 1

## 2016-09-10 MED ORDER — SODIUM CHLORIDE 0.9 % IV SOLN
INTRAVENOUS | Status: AC
  Administered 2016-09-10: 15:00:00 via INTRAVENOUS

## 2016-09-10 MED ORDER — POTASSIUM CHLORIDE CRYS ER 20 MEQ PO TBCR
40.0000 meq | EXTENDED_RELEASE_TABLET | Freq: Two times a day (BID) | ORAL | Status: AC
  Administered 2016-09-10 (×2): 40 meq via ORAL
  Filled 2016-09-10 (×2): qty 2

## 2016-09-10 MED ORDER — CITALOPRAM HYDROBROMIDE 10 MG PO TABS
20.0000 mg | ORAL_TABLET | Freq: Every day | ORAL | Status: DC
  Administered 2016-09-10 – 2016-09-12 (×3): 20 mg via ORAL
  Filled 2016-09-10 (×3): qty 2

## 2016-09-10 MED ORDER — INSULIN ASPART 100 UNIT/ML ~~LOC~~ SOLN
0.0000 [IU] | Freq: Three times a day (TID) | SUBCUTANEOUS | Status: DC
  Administered 2016-09-10: 5 [IU] via SUBCUTANEOUS
  Administered 2016-09-11: 3 [IU] via SUBCUTANEOUS
  Administered 2016-09-11 – 2016-09-12 (×2): 5 [IU] via SUBCUTANEOUS
  Administered 2016-09-12: 3 [IU] via SUBCUTANEOUS

## 2016-09-10 MED ORDER — ACETAMINOPHEN 325 MG PO TABS
650.0000 mg | ORAL_TABLET | Freq: Four times a day (QID) | ORAL | Status: DC | PRN
  Administered 2016-09-10 – 2016-09-11 (×2): 650 mg via ORAL
  Filled 2016-09-10 (×2): qty 2

## 2016-09-10 MED ORDER — ACETAMINOPHEN 650 MG RE SUPP
650.0000 mg | Freq: Four times a day (QID) | RECTAL | Status: DC | PRN
  Filled 2016-09-10: qty 1

## 2016-09-10 MED ORDER — LATANOPROST 0.005 % OP SOLN
1.0000 [drp] | Freq: Every day | OPHTHALMIC | Status: DC
  Administered 2016-09-10 – 2016-09-11 (×2): 1 [drp] via OPHTHALMIC
  Filled 2016-09-10: qty 2.5

## 2016-09-10 MED ORDER — ATORVASTATIN CALCIUM 10 MG PO TABS
20.0000 mg | ORAL_TABLET | Freq: Every day | ORAL | Status: DC
  Administered 2016-09-10 – 2016-09-11 (×2): 20 mg via ORAL
  Filled 2016-09-10 (×2): qty 2

## 2016-09-10 NOTE — H&P (Signed)
Date: 09/10/2016               Patient Name:  Cody Silva MRN: [id number]  DOB: [birth year] Age / Sex: 71 y.o., male   PCP: [facility]         Medical Service: Internal Medicine Teaching Service         Attending Physician: Dr. Gilles Chiquito    First Contact: Dr. Asencion Partridge Pager: [phone]  Second Contact: Dr. Burgess Estelle Pager: [phone]       After Hours (After 5p/  First Contact Pager: [phone]  weekends / holidays): Second Contact Pager: [phone]   Chief Complaint: Syncope  History of Present Illness: Cody Silva is a 71 y.o. gentleman with PMH CVA with residual right hemiparesis, TIA, insulin-dependent T2DM, HTN, HLD, gout, CKD3B, PTSD, OSA, glaucoma who presents after probable syncopal episode at home. He reports waking up in the middle of the night and going downstairs to make a sandwich. The next thing he recalls is waking up on the floor in his kitchen, and struggling to get up due to generalized weakness. He cannot recall what happened, but denies any recent sick symptoms, dizziness on standing, or palpitations. Denies incontinence of bladder/bowel, or sustaining any injury or trauma after this episode. Per his wife he awoke in the night and said he was going downstairs to find his phone, and she found him sleeping against a cabinet at 230 AM. He was easily awakened and seemed confused at the time. He returned to baseline by time of presentation. He also reports generalized fatigue, poor appetite, significant weight loss (approx 25 lbs in 3 months), and 1 loose BM daily brought about by consuming eggs or dairy. He has difficulty recalling the medications he takes. He says that he takes his own medications but admits to missing doses on a daily basis. He denies fevers, coughing, chest pain, shortness of breath, abdominal pain, nausea, vomiting, hematochezia, melena, numbness, tingling, weakness, lightheadedness or dizziness.  He had a TIA in January 2017 and  an admission for a presyncopal event in April 2017. Loop recorder was inserted at that time but has shown no documented arrhythmias since then, including on interrogation today. Carotid dopplers 09/2015 unremarkable - ICAs 0-39% stenosis and patent VAs bilaterally.  In the ED - vitals T 99.3, HR 74, RR 18, BP 160/83, 100% SpO2 on RA and neurologically intact on exam. Labs remarkable for K 3.2, glucose 271, Cr 2.02, Hb 11.5 (MCV 87), plts 125, troponin 0.02. CT head/neck negative for acute abnormality, chronic ischemic cerebral changes and degenerative cervical spine stable. CXR unremarkable and EKG unchanged from previous. IMTS contacted for admission.   Meds:  Current Meds  Medication Sig  . acetaminophen (TYLENOL) 500 MG tablet Take 1 tablet (500 mg total) by mouth every 6 (six) hours as needed for mild pain, moderate pain, fever or headache.  . albuterol (PROVENTIL HFA;VENTOLIN HFA) 108 (90 BASE) MCG/ACT inhaler Inhale 2 puffs into the lungs every 6 (six) hours as needed for wheezing.  [name] amLODipine (NORVASC) 10 MG tablet Take 10 mg by mouth daily.  . Aspirin-Salicylamide-Caffeine (BC HEADACHE POWDER PO) Take 1 packet by mouth daily as needed (pain).  . carvedilol (COREG) 25 MG tablet Take 50 mg by mouth 2 (two) times daily. Reported on 09/14/2015  . citalopram (CELEXA) 20 MG tablet Take 20 mg by mouth daily.  . cloNIDine (CATAPRES - DOSED IN MG/24 HR) 0.3 mg/24hr Place 1 patch onto the skin  once a week. No specific day  . clopidogrel (PLAVIX) 75 MG tablet Take 1 tablet (75 mg total) by mouth at bedtime.  . fluocinonide cream (LIDEX) 4.43 % Apply 1 application topically daily.   . fluticasone (FLONASE) 50 MCG/ACT nasal spray Place 2 sprays into the nose daily as needed for allergies.   . Ginkgo Biloba (GNP GINGKO BILOBA EXTRACT PO) Take 1 capsule by mouth daily.  . insulin aspart (NOVOLOG) 100 UNIT/ML injection Inject 5-15 Units into the skin 3 (three) times daily with meals. Per sliding scale  .  insulin glargine (LANTUS) 100 UNIT/ML injection Inject 0-30 Units into the skin at bedtime.   [name] latanoprost (XALATAN) 0.005 % ophthalmic solution Place 1 drop into both eyes at bedtime.   [name] loratadine (CLARITIN) 10 MG tablet Take 10 mg by mouth daily as needed for allergies.  [name] losartan (COZAAR) 100 MG tablet Take 100 mg by mouth daily.  . Multiple Vitamins-Minerals (MULTIVITAMIN PO) Take 1 tablet by mouth daily.  . polyvinyl alcohol (LIQUIFILM TEARS) 1.4 % ophthalmic solution Place 1 drop into both eyes 5 (five) times daily as needed (for dry eyes).  . simvastatin (ZOCOR) 40 MG tablet Take 40 mg by mouth every evening.  . Skin Protectants, Misc. (EUCERIN) cream Apply 1 application topically daily.   [name] spironolactone (ALDACTONE) 25 MG tablet Take 25 mg by mouth daily.  [name] UNKNOWN TO PATIENT Place 1 drop into both eyes every morning. Other eye drop, unknown name  (received from [place]     Allergies: Allergies as of 09/10/2016 - Review Complete 09/10/2016  Allergen Reaction Noted  . Metformin and related Diarrhea 06/12/2013   Past Medical History:  Diagnosis Date  . Anemia   . Arthritis    "right arm, right ankle, right side" (10/30/2014)  . Colon polyps   . Diabetic neuropathy ([place])   . Diabetic retinopathy ([place])   . H/O agent Orange exposure   . Headache    "@ least 3 times/wk" (10/30/2014)  . History of blood transfusion 10/30/2014   hematochezia  . History of gout   . Hypertension   . OSA on CPAP    "suppose to wear mask; I've got a call in for an equipment change" (10/30/2014)  . PTSD (post-traumatic stress disorder)    "service related"  . Stroke [facility]) 2014   left extremity deficits; facial left  . Type II diabetes mellitus ([facility])     Family History:  Family History  Problem Relation Age of Onset  . Diabetes Mother   . Breast cancer Mother   . Heart attack Mother     CABG - Age 27  . Stroke Brother   . Lung disease Father   . Neurofibromatosis Maternal Uncle     Social  History:  Social History   Social History  . Marital status: Married    Spouse name: sheila  . Number of children: 4  . Years of education: 10   Occupational History  . Not on file.   Social History Main Topics  . Smoking status: Never Smoker  . Smokeless tobacco: Never Used  . Alcohol use 0.6 oz/week    1 Cans of beer per week     Comment: 10/30/2014 "might have a beer a couple times/yr"  . Drug use: No  . Sexual activity: Yes   Other Topics Concern  . Not on file   Social History Narrative   Patient is married with 4 children   Patient is right handed  Patient has a Bachelor's degree    Patient 4 cups daily   Retired police officer. Lives with wife, daughter, cousin in [place].   Independent in ADLs and partially dependent for IADLs.   Ambulates with cane sometimes due to intermittent right leg weakness.     Review of Systems: A complete ROS was negative except as per HPI.   Physical Exam: Blood pressure 170/88, pulse 73, temperature 99.3 F (37.4 C), resp. rate (!) 9, weight 205 lb 11 oz (93.3 kg), SpO2 100 %.  General appearance: Elderly African American gentleman sleeping comfortably in bed, rouses to voice, in no distress, falls asleep often during conversation but oriented x4 HENT: Normocephalic, atraumatic, moist mucous membranes, oropharynx clear, neck supple Eyes: PERRL, EOM inact, non-icteric Cardiovascular: Regular rate and rhythm, soft systolic murmur, no rubs, gallops Respiratory: Clear to auscultation bilaterally, normal work of breathing Abdomen: BS+, soft, non-tender, non-distended Extremities: Left leg edema > right, 1+ pitting to knee, 1+ peripheral pulses Skin: Warm, dry, intact Neuro: Alert and oriented, limited vision of left eye, tongue tremor noted, otherwise CNs grossly intact, no pronator drift, strength and sensation intact, FNF intact, gait deferred Psych: Appropriate affect, clear speech, thoughts linear and goal-directed  Assessment  & Plan by Problem:  Generalized fatigue, vs syncopal episode has had several admissions for similar presyncopal episodes and workup has been negative so far. TTE 12/2015, carotid US 09/2015 normal, loop recorder since 12/2015 with no document arrhythmias. He has a history of stroke in 2014 with mild residual R deficits, had a TIA in 09/2015, and has chronic microvascular changes visible on imaging. 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 Denies any palpitations, seizures, incontinence. Neuro exam intact aside from loss of vision in left eye due to previous surgery, sustained no injuries and no bruises on exam. Found asleep leaning again cabinet in kitchen - not consistent with syncope/fall from standing. Poorly managing his medications at home, also has OSA and is noncompliant with CPAP and falling asleep during conversation today. Cardiac etiology seems unlikely, most likely poor medication management vs excess sleepiness in setting of CPAP noncompliance. - Check orthostatics - EEG - Trend troponins - Replete electrolyte abnormalities, check Mg, Phos - Checking HCV, HIV, ESR, Vit D, Vit B12 - CPAP QHS - Gradually restart home medications - Will likely order HH RN for medication managment  Unintentional weight loss, 206 lbs down from 232 in Oct 2017. Endorses fatigue and poor po intake due to loss of appetite. States the he has lost "3 inches off his waist-line." Denies hematochezia or melena. - Nutrition consult  T2DM, insulin-dependent, last Hb A1c 7.9 in 12/2015 - SSI moderate, CBG TID AC HS  - CM diet - Check Hb A1c  Hypertension, BP up to 181/101, on numerous antihypertensives at home including weekly clonidine patch, forgot to change it yesterday - Taper/discontinue Clonidine by discharge, administer 0.1 mg patch now - Continue home Coreg 50 mg BID - Continue home Amlodipine 10 mg daily - Holding home Losartan and Spironolactone  Thrombocytopenia, chronic mild,  platelets currently 125. History of agent orange exposure puts him at risk for developing MDS - Trend CBC   History of CVA - continue Plavix Glaucoma - continue home eye drops Hyperlipidemia - Lipitor  Functional status, questionably independent in ADLs, dependent in IADLs at home  - PT OT eval and treat - Will need HH RN for medication management and home assessment   FEN/GI: CM diet, replete electrolytes as needed  DVT   ppx: Lovenox  Code status: Full code  Dispo: Admit patient to Observation with expected length of stay less than 2 midnights.  Signed:  , MD 09/10/2016, 2:20 PM  Pager: [phone]  

## 2016-09-10 NOTE — ED Provider Notes (Signed)
[facility] DEPT Provider Note   CSN: [id number] Arrival date & time: 09/10/16  0509     History   Chief Complaint Chief Complaint  Patient presents with  . Weakness    HPI Cody Silva is a 71 y.o. male.  Cody Silva is a 71 y.o. Male with history of previous stroke, and DM who presents to the emergency department after probable syncopal episode prior to arrival today. Patient's wife reports she last saw the patient normally around 7 PM last night. She reports the patient woke up around 2 AM this morning and told her he was going downstairs to look for his phone. She reports his phone was next to him and he seemed to be slightly confused. She then found him downstairs in the kitchen sitting up against a cabinet, asleep at 2:30 am this morning. He was easily awakened but seemed slightly confused and asked where he was going. She reports now the patient has returned to baseline. Patient has a history of a previous stroke and has some left sided extremity weakness chronically.  Patient does not remember the fall. Family reports he's had several episodes of passing out over the past month. They also report he's lost 15-20 pounds in the past month. They also reported the patient is been having loose stools after eating for the past month. Patient reports feeling back to normal. Patient denies fevers, coughing, chest pain, shortness of breath, abdominal pain, nausea, vomiting, changes to his vision, double vision, numbness, tingling, weakness, lightheadedness or dizziness.   The history is provided by the patient and medical records. No language interpreter was used.  Weakness  Primary symptoms include no dizziness. Pertinent negatives include no shortness of breath, no chest pain, no vomiting and no headaches.    Past Medical History:  Diagnosis Date  . Anemia   . Arthritis    "right arm, right ankle, right side" (10/30/2014)  . Colon polyps   . Diabetic neuropathy ([place])   .  Diabetic retinopathy ([place])   . Headache    "@ least 3 times/wk" (10/30/2014)  . History of blood transfusion 10/30/2014   hematochezia  . History of gout   . Hypertension   . OSA on CPAP    "suppose to wear mask; I've got a call in for an equipment change" (10/30/2014)  . PTSD (post-traumatic stress disorder)    "service related"  . Stroke [facility]) 2014   left extremity deficits; facial left  . Type II diabetes mellitus [facility])     Patient Active Problem List   Diagnosis Date Noted  . Syncope and collapse 09/10/2016  . H/O agent Orange exposure 12/24/2015  . Type 2 diabetes with nephropathy ([place])   . Near syncope 12/22/2015  . History of TIAs 09/14/2015  . CKD stage 3 due to type 2 diabetes mellitus ([place]) 09/14/2015  . Acute lower GI bleeding 10/30/2014  . Orthostatic hypotension 10/30/2014  . History of colonic polyps 10/30/2014  . Depression 10/30/2014  . Symptomatic anemia 10/30/2014  . AKI (acute kidney injury) ([place]) 10/30/2014  . Ataxic gait 09/21/2014  . History of CVA (cerebrovascular accident) 02/22/2013  . Abnormal brain scan 02/21/2013  . Dizziness 02/21/2013  . Weakness 02/21/2013  . Diabetes mellitus ([place]) 02/21/2013  . Hypertension 02/21/2013    Past Surgical History:  Procedure Laterality Date  . BONE GRAFT HIP ILIAC CREST Left ~ 1976  . CATARACT EXTRACTION W/ INTRAOCULAR LENS  IMPLANT, BILATERAL Bilateral 2014-2015  . EP IMPLANTABLE DEVICE N/A  12/24/2015   Procedure: Loop Recorder Insertion;  Surgeon: Thompson Grayer, MD;  Location: [employer] [facility];  Service: Cardiovascular;  Laterality: N/A;  . TUMOR REMOVAL Right ~ 1976   "arm; had to take bone left hip to add to the repair"       Home Medications    Prior to Admission medications   Medication Sig Start Date End Date Taking? Authorizing Provider  acetaminophen (TYLENOL) 500 MG tablet Take 1 tablet (500 mg total) by mouth every 6 (six) hours as needed for mild pain, moderate pain, fever or headache.  11/01/14  Yes Marjan Rabbani, MD  albuterol (PROVENTIL HFA;VENTOLIN HFA) 108 (90 BASE) MCG/ACT inhaler Inhale 2 puffs into the lungs every 6 (six) hours as needed for wheezing.   Yes Historical Provider, MD  amLODipine (NORVASC) 10 MG tablet Take 10 mg by mouth daily.   Yes Historical Provider, MD  Aspirin-Salicylamide-Caffeine (BC HEADACHE POWDER PO) Take 1 packet by mouth daily as needed (pain).   Yes Historical Provider, MD  carvedilol (COREG) 25 MG tablet Take 50 mg by mouth 2 (two) times daily. Reported on 09/14/2015   Yes Historical Provider, MD  citalopram (CELEXA) 20 MG tablet Take 20 mg by mouth daily.   Yes Historical Provider, MD  cloNIDine (CATAPRES - DOSED IN MG/24 HR) 0.3 mg/24hr Place 1 patch onto the skin once a week. No specific day   Yes Historical Provider, MD  clopidogrel (PLAVIX) 75 MG tablet Take 1 tablet (75 mg total) by mouth at bedtime. 11/08/14  Yes Marjan Rabbani, MD  fluocinonide cream (LIDEX) 8.14 % Apply 1 application topically daily.    Yes Historical Provider, MD  fluticasone (FLONASE) 50 MCG/ACT nasal spray Place 2 sprays into the nose daily as needed for allergies.    Yes Historical Provider, MD  Ginkgo Biloba (GNP GINGKO BILOBA EXTRACT PO) Take 1 capsule by mouth daily.   Yes Historical Provider, MD  insulin aspart (NOVOLOG) 100 UNIT/ML injection Inject 5-15 Units into the skin 3 (three) times daily with meals. Per sliding scale   Yes Historical Provider, MD  insulin glargine (LANTUS) 100 UNIT/ML injection Inject 0-30 Units into the skin at bedtime.    Yes Historical Provider, MD  latanoprost (XALATAN) 0.005 % ophthalmic solution Place 1 drop into both eyes at bedtime.    Yes Historical Provider, MD  loratadine (CLARITIN) 10 MG tablet Take 10 mg by mouth daily as needed for allergies.   Yes Historical Provider, MD  losartan (COZAAR) 100 MG tablet Take 100 mg by mouth daily.   Yes Historical Provider, MD  Multiple Vitamins-Minerals (MULTIVITAMIN PO) Take 1 tablet by  mouth daily.   Yes Historical Provider, MD  polyvinyl alcohol (LIQUIFILM TEARS) 1.4 % ophthalmic solution Place 1 drop into both eyes 5 (five) times daily as needed (for dry eyes).   Yes Historical Provider, MD  simvastatin (ZOCOR) 40 MG tablet Take 40 mg by mouth every evening.   Yes Historical Provider, MD  Skin Protectants, Misc. (EUCERIN) cream Apply 1 application topically daily.    Yes Historical Provider, MD  spironolactone (ALDACTONE) 25 MG tablet Take 25 mg by mouth daily.   Yes Historical Provider, MD  UNKNOWN TO PATIENT Place 1 drop into both eyes every morning. Other eye drop, unknown name  (received from [place]    Yes Historical Provider, MD  gabapentin (NEURONTIN) 300 MG capsule Take 300 mg by mouth daily as needed (leg pain).     Historical Provider, MD    [facility]  History Family History  Problem Relation Age of Onset  . Diabetes Mother   . Breast cancer Mother   . Heart attack Mother     CABG - Age 70  . Stroke Brother   . Lung disease Father   . Neurofibromatosis Maternal Uncle     Social History Social History  Substance Use Topics  . Smoking status: Never Smoker  . Smokeless tobacco: Never Used  . Alcohol use 0.6 oz/week    1 Cans of beer per week     Comment: 10/30/2014 "might have a beer a couple times/yr"     Allergies   Metformin and related   Review of Systems Review of Systems  Constitutional: Positive for unexpected weight change. Negative for chills and fever.  HENT: Negative for congestion and sore throat.   Eyes: Negative for visual disturbance.  Respiratory: Negative for cough and shortness of breath.   Cardiovascular: Negative for chest pain and palpitations.  Gastrointestinal: Positive for diarrhea. Negative for abdominal pain, nausea and vomiting.  Genitourinary: Negative for difficulty urinating, dysuria and hematuria.  Musculoskeletal: Negative for back pain and neck pain.  Skin: Negative for rash.  Neurological: Positive for syncope and  weakness. Negative for dizziness, light-headedness, numbness and headaches.     Physical Exam Updated Vital Signs BP 180/84   Pulse 69   Temp 99.3 F (37.4 C)   Resp 16   SpO2 100%   Physical Exam  Constitutional: He is oriented to person, place, and time. He appears well-developed and well-nourished. No distress.  Nontoxic appearing.  HENT:  Head: Normocephalic and atraumatic.  Right Ear: External ear normal.  Left Ear: External ear normal.  Mouth/Throat: Oropharynx is clear and moist.  No visible or palpated signs of head trauma.  Eyes: Conjunctivae and EOM are normal. Pupils are equal, round, and reactive to light. Right eye exhibits no discharge. Left eye exhibits no discharge.  Neck: Normal range of motion. Neck supple. No JVD present. No tracheal deviation present.  Cardiovascular: Normal rate, regular rhythm and intact distal pulses.  Exam reveals no gallop and no friction rub.   Murmur heard. Bilateral radial, posterior tibialis and dorsalis pedis pulses are intact.    Pulmonary/Chest: Effort normal and breath sounds normal. No stridor. No respiratory distress. He has no wheezes. He has no rales.  Lungs clear to auscultation bilaterally.  Abdominal: Soft. He exhibits no mass. There is no tenderness.  Musculoskeletal: Normal range of motion. He exhibits edema. He exhibits no tenderness.  Mild ankle edema noted bilaterally. Good strength in his bilateral upper and lower extremities. No weakness identified. Good grip strength bilaterally.  Lymphadenopathy:    He has no cervical adenopathy.  Neurological: He is alert and oriented to person, place, and time. No cranial nerve deficit or sensory deficit. He exhibits normal muscle tone. Coordination normal.  Patient is alert and oriented 3. Cranial nerves are intact. Speech is clear and coherent. Good grip strengths bilaterally. No pronator drift. Finger to nose intact bilaterally. No facial droop. Tongue is midline. Slight left  arm weakness on exam which is his baseline. Heel-to-shin intact. EOMs are intact. Vision is grossly intact.  Skin: Skin is warm and dry. Capillary refill takes less than 2 seconds. No rash noted. He is not diaphoretic. No erythema. No pallor.  Psychiatric: He has a normal mood and affect. His behavior is normal.  Nursing note and vitals reviewed.    ED Treatments / Results  Labs (all labs ordered are listed, but  only abnormal results are displayed) Labs Reviewed  CBC - Abnormal; Notable for the following:       Result Value   RBC 3.90 (*)    Hemoglobin 11.5 (*)    HCT 33.9 (*)    Platelets 125 (*)    All other components within normal limits  COMPREHENSIVE METABOLIC PANEL - Abnormal; Notable for the following:    Potassium 3.2 (*)    Glucose, Bld 271 (*)    Creatinine, Ser 2.02 (*)    Albumin 3.2 (*)    ALT 11 (*)    GFR calc non Af Amer 32 (*)    GFR calc Af Amer 37 (*)    All other components within normal limits  CBG MONITORING, ED - Abnormal; Notable for the following:    Glucose-Capillary 262 (*)    All other components within normal limits  I-STAT CHEM 8, ED - Abnormal; Notable for the following:    Potassium 3.2 (*)    Chloride 100 (*)    BUN 22 (*)    Creatinine, Ser 2.00 (*)    Glucose, Bld 274 (*)    Hemoglobin 11.6 (*)    HCT 34.0 (*)    All other components within normal limits  PROTIME-INR  APTT  DIFFERENTIAL  I-STAT TROPOININ, ED  CBG MONITORING, ED    EKG  EKG Interpretation None       Radiology Dg Chest 2 View  Result Date: 09/10/2016 CLINICAL DATA:  Fall yesterday with chest pain, initial encounter EXAM: CHEST  2 VIEW COMPARISON:  12/22/2015 FINDINGS: Cardiac shadow is again mildly enlarged but stable. A loop recorder is now noted in place. Lungs are well aerated bilaterally. No focal infiltrate or sizable effusion is seen. No acute bony abnormality is noted. IMPRESSION: No acute abnormality noted. Electronically Signed   By: Inez Catalina M.D.    On: 09/10/2016 07:50   Ct Head Wo Contrast  Result Date: 09/10/2016 CLINICAL DATA:  Recent fall this morning, initial encounter EXAM: CT HEAD WITHOUT CONTRAST CT CERVICAL SPINE WITHOUT CONTRAST TECHNIQUE: Multidetector CT imaging of the head and cervical spine was performed following the standard protocol without intravenous contrast. Multiplanar CT image reconstructions of the cervical spine were also generated. COMPARISON:  07/25/2016 FINDINGS: CT HEAD FINDINGS Brain: Mild atrophic and chronic white matter ischemic changes are again noted. No findings to suggest acute hemorrhage, acute infarction or space-occupying mass lesion are noted. Vascular: No hyperdense vessel or unexpected calcification. Skull: Normal. Negative for fracture or focal lesion. Sinuses/Orbits: No acute finding. Other: None. CT CERVICAL SPINE FINDINGS Alignment: Within normal limits. Skull base and vertebrae: 7 cervical segments are well visualized. Vertebral body height is well maintained. Mild osteophytic changes and facet hypertrophic changes are seen. No acute fracture or acute facet abnormality is noted. Soft tissues and spinal canal: Within normal limits for the patient's age. Disc levels: Mild disc space narrowing is noted at C7-T1. No significant disc pathology is noted. Upper chest: Within normal limits. IMPRESSION: CT of the head: Chronic atrophic and ischemic changes stable from the prior exam. CT of the cervical spine: Multilevel degenerative change without acute abnormality. Electronically Signed   By: Inez Catalina M.D.   On: 09/10/2016 07:36   Ct Cervical Spine Wo Contrast  Result Date: 09/10/2016 CLINICAL DATA:  Recent fall this morning, initial encounter EXAM: CT HEAD WITHOUT CONTRAST CT CERVICAL SPINE WITHOUT CONTRAST TECHNIQUE: Multidetector CT imaging of the head and cervical spine was performed following the standard protocol without  intravenous contrast. Multiplanar CT image reconstructions of the cervical spine were  also generated. COMPARISON:  07/25/2016 FINDINGS: CT HEAD FINDINGS Brain: Mild atrophic and chronic white matter ischemic changes are again noted. No findings to suggest acute hemorrhage, acute infarction or space-occupying mass lesion are noted. Vascular: No hyperdense vessel or unexpected calcification. Skull: Normal. Negative for fracture or focal lesion. Sinuses/Orbits: No acute finding. Other: None. CT CERVICAL SPINE FINDINGS Alignment: Within normal limits. Skull base and vertebrae: 7 cervical segments are well visualized. Vertebral body height is well maintained. Mild osteophytic changes and facet hypertrophic changes are seen. No acute fracture or acute facet abnormality is noted. Soft tissues and spinal canal: Within normal limits for the patient's age. Disc levels: Mild disc space narrowing is noted at C7-T1. No significant disc pathology is noted. Upper chest: Within normal limits. IMPRESSION: CT of the head: Chronic atrophic and ischemic changes stable from the prior exam. CT of the cervical spine: Multilevel degenerative change without acute abnormality. Electronically Signed   By: Inez Catalina M.D.   On: 09/10/2016 07:36    Procedures Procedures (including critical care time)  Medications Ordered in ED Medications - No data to display   Initial Impression / Assessment and Plan / ED Course  I have reviewed the triage vital signs and the nursing notes.  Pertinent labs & imaging results that were available during my care of the patient were reviewed by me and considered in my medical decision making (see chart for details).  Clinical Course    Patient presented to the emergency department after syncopal episode at home. Patient was found by wife around 2:30 AM sitting on the kitchen floor asleep and was slightly confused after being easily aroused. Patient had no acute neurological deficits on exam. His history of a previous stroke. He has had several single episodes over the past month  according to family. No visible signs of trauma on exam. Patient denies any chest pain or shortness of breath. He does have a loop recorder placed.   CMP is around his baseline. Moderate elevation of creatinine of 2.02 up from a baseline of 1.80. Hemoglobin is at baseline at 11.5 on CBC. No leukocytosis. Troponin is not elevated.  Chest x-ray with no acute abnormalities. CT of the head shows chronic ischemic changes stable from the prior exam. Cervical spine shows no acute abnormality.  Concerned about this patient's frequent syncopal episodes. Would like to admit to medicine for further work up. Patient and family agrees with plan. Doubt acute stroke, but possible TIA. Lower suspicion for cardiac, but loop recorder information not back yet.   I consulted with internal medicine teaching service who accepted the patient for admission.   Loop recorder was interrogated, which showed no acute findings.  This patient was discussed with and evaluated by Dr. Rex Kras who agrees with assessment and plan.   Final Clinical Impressions(s) / ED Diagnoses   Final diagnoses:  Syncope and collapse  Diarrhea, unspecified type    New Prescriptions New Prescriptions   No medications on file     Waynetta Pean, PA-C 09/10/16 [place], MD 09/10/16 [phone]

## 2016-09-10 NOTE — ED Triage Notes (Signed)
Pt here for confusion at 0230, now alert and oriented. Was found in kitchen floor sts slid down and was had sudden confusion. No new deficits now. Dr Wyvonnia Dusky evaluated in triage and no stroke called.

## 2016-09-10 NOTE — ED Provider Notes (Addendum)
MSE was initiated and I personally evaluated the patient and placed orders (if any) at  5:36 AM on September 10, 2016.  The patient appears stable so that the remainder of the MSE may be completed by another provider.   Asked to see Patient for possible code stroke. Patient sustained a fall and was found on the ground in the kitchen. He does not remember falling. Denies dizziness. Last seen normal was 2:30 AM. Previous stroke with right-sided deficits which are unchanged.  Wife reports he had trembling of his face and right arm as well as confusion. These have since resolved. No difficulty speaking or swallowing. Sugar 262.  Patient alert and oriented 3. There is no facial droop. Tongue is midline. Mild weakness of the right arm (baseline). No pronator drift. No ataxia finger to nose.  Code stroke not activated due to no localizing deficits and symptoms are improving.   Ezequiel Essex, MD 09/10/16 [place], MD 09/10/16 [phone]

## 2016-09-11 ENCOUNTER — Observation Stay (HOSPITAL_BASED_OUTPATIENT_CLINIC_OR_DEPARTMENT_OTHER)
Admit: 2016-09-11 | Discharge: 2016-09-11 | Disposition: A | Discharge status: Home / Self Care | Payer: Medicare Other | Attending: Internal Medicine | Admitting: Internal Medicine

## 2016-09-11 DIAGNOSIS — E11319 Type 2 diabetes mellitus with unspecified diabetic retinopathy without macular edema: Secondary | ICD-10-CM | POA: Diagnosis present

## 2016-09-11 DIAGNOSIS — Z794 Long term (current) use of insulin: Secondary | ICD-10-CM | POA: Diagnosis not present

## 2016-09-11 DIAGNOSIS — G4733 Obstructive sleep apnea (adult) (pediatric): Secondary | ICD-10-CM | POA: Diagnosis present

## 2016-09-11 DIAGNOSIS — E11621 Type 2 diabetes mellitus with foot ulcer: Secondary | ICD-10-CM | POA: Diagnosis present

## 2016-09-11 DIAGNOSIS — F4310 Post-traumatic stress disorder, unspecified: Secondary | ICD-10-CM | POA: Diagnosis present

## 2016-09-11 DIAGNOSIS — E1140 Type 2 diabetes mellitus with diabetic neuropathy, unspecified: Secondary | ICD-10-CM | POA: Diagnosis present

## 2016-09-11 DIAGNOSIS — R55 Syncope and collapse: Secondary | ICD-10-CM

## 2016-09-11 DIAGNOSIS — R3915 Urgency of urination: Secondary | ICD-10-CM | POA: Diagnosis present

## 2016-09-11 DIAGNOSIS — N183 Chronic kidney disease, stage 3 (moderate): Secondary | ICD-10-CM | POA: Diagnosis present

## 2016-09-11 DIAGNOSIS — L97519 Non-pressure chronic ulcer of other part of right foot with unspecified severity: Secondary | ICD-10-CM | POA: Diagnosis present

## 2016-09-11 DIAGNOSIS — R197 Diarrhea, unspecified: Secondary | ICD-10-CM | POA: Diagnosis present

## 2016-09-11 DIAGNOSIS — M19071 Primary osteoarthritis, right ankle and foot: Secondary | ICD-10-CM | POA: Diagnosis present

## 2016-09-11 DIAGNOSIS — E440 Moderate protein-calorie malnutrition: Secondary | ICD-10-CM | POA: Diagnosis present

## 2016-09-11 DIAGNOSIS — E1122 Type 2 diabetes mellitus with diabetic chronic kidney disease: Secondary | ICD-10-CM | POA: Diagnosis present

## 2016-09-11 DIAGNOSIS — I69351 Hemiplegia and hemiparesis following cerebral infarction affecting right dominant side: Secondary | ICD-10-CM | POA: Diagnosis not present

## 2016-09-11 DIAGNOSIS — Z7902 Long term (current) use of antithrombotics/antiplatelets: Secondary | ICD-10-CM | POA: Diagnosis not present

## 2016-09-11 DIAGNOSIS — Z9841 Cataract extraction status, right eye: Secondary | ICD-10-CM | POA: Diagnosis not present

## 2016-09-11 DIAGNOSIS — I129 Hypertensive chronic kidney disease with stage 1 through stage 4 chronic kidney disease, or unspecified chronic kidney disease: Secondary | ICD-10-CM | POA: Diagnosis present

## 2016-09-11 DIAGNOSIS — E785 Hyperlipidemia, unspecified: Secondary | ICD-10-CM | POA: Diagnosis present

## 2016-09-11 DIAGNOSIS — Z961 Presence of intraocular lens: Secondary | ICD-10-CM | POA: Diagnosis present

## 2016-09-11 DIAGNOSIS — I10 Essential (primary) hypertension: Secondary | ICD-10-CM | POA: Diagnosis present

## 2016-09-11 DIAGNOSIS — H409 Unspecified glaucoma: Secondary | ICD-10-CM | POA: Diagnosis present

## 2016-09-11 DIAGNOSIS — R634 Abnormal weight loss: Secondary | ICD-10-CM | POA: Diagnosis present

## 2016-09-11 DIAGNOSIS — D696 Thrombocytopenia, unspecified: Secondary | ICD-10-CM | POA: Diagnosis present

## 2016-09-11 LAB — URINALYSIS, ROUTINE W REFLEX MICROSCOPIC
BACTERIA UA: NONE SEEN
BILIRUBIN URINE: NEGATIVE
Glucose, UA: 50 mg/dL — AB
HGB URINE DIPSTICK: NEGATIVE
KETONES UR: NEGATIVE mg/dL
LEUKOCYTES UA: NEGATIVE
NITRITE: NEGATIVE
PH: 5 (ref 5.0–8.0)
Protein, ur: >=300 mg/dL — AB
SPECIFIC GRAVITY, URINE: 1.017 (ref 1.005–1.030)
SQUAMOUS EPITHELIAL / LPF: NONE SEEN

## 2016-09-11 LAB — CBC
HCT: 30.4 % — ABNORMAL LOW (ref 39.0–52.0)
Hemoglobin: 10.4 g/dL — ABNORMAL LOW (ref 13.0–17.0)
MCH: 29.7 pg (ref 26.0–34.0)
MCHC: 34.2 g/dL (ref 30.0–36.0)
MCV: 86.9 fL (ref 78.0–100.0)
Platelets: 110 10*3/uL — ABNORMAL LOW (ref 150–400)
RBC: 3.5 MIL/uL — AB (ref 4.22–5.81)
RDW: 13.3 % (ref 11.5–15.5)
WBC: 4.1 10*3/uL (ref 4.0–10.5)

## 2016-09-11 LAB — GLUCOSE, CAPILLARY
GLUCOSE-CAPILLARY: 159 mg/dL — AB (ref 65–99)
Glucose-Capillary: 176 mg/dL — ABNORMAL HIGH (ref 65–99)
Glucose-Capillary: 215 mg/dL — ABNORMAL HIGH (ref 65–99)
Glucose-Capillary: 199 mg/dL — ABNORMAL HIGH (ref 65–99)

## 2016-09-11 LAB — COMPREHENSIVE METABOLIC PANEL
ALT: 10 U/L — AB (ref 17–63)
ANION GAP: 6 (ref 5–15)
AST: 16 U/L (ref 15–41)
Albumin: 2.8 g/dL — ABNORMAL LOW (ref 3.5–5.0)
Alkaline Phosphatase: 48 U/L (ref 38–126)
BUN: 23 mg/dL — ABNORMAL HIGH (ref 6–20)
CO2: 28 mmol/L (ref 22–32)
CREATININE: 1.98 mg/dL — AB (ref 0.61–1.24)
Calcium: 8.8 mg/dL — ABNORMAL LOW (ref 8.9–10.3)
Chloride: 106 mmol/L (ref 101–111)
GFR calc non Af Amer: 33 mL/min — ABNORMAL LOW (ref >60)
GFR, EST AFRICAN AMERICAN: 38 mL/min — AB (ref >60)
Glucose, Bld: 136 mg/dL — ABNORMAL HIGH (ref 65–99)
Potassium: 3.5 mmol/L (ref 3.5–5.1)
Sodium: 140 mmol/L (ref 135–145)
Total Bilirubin: 0.7 mg/dL (ref 0.3–1.2)
Total Protein: 6 g/dL — ABNORMAL LOW (ref 6.5–8.1)

## 2016-09-11 LAB — HIV ANTIBODY (ROUTINE TESTING W REFLEX): HIV Screen 4th Generation wRfx: NONREACTIVE

## 2016-09-11 LAB — TROPONIN I: TROPONIN I: 0.03 ng/mL — AB (ref <0.03)

## 2016-09-11 LAB — HEPATITIS C ANTIBODY: HCV Ab: <0.1 s/co ratio (ref 0.0–0.9)

## 2016-09-11 LAB — HEMOGLOBIN A1C
Hgb A1c MFr Bld: 7.1 % — ABNORMAL HIGH (ref 4.8–5.6)
MEAN PLASMA GLUCOSE: 157 mg/dL

## 2016-09-11 LAB — VITAMIN D 25 HYDROXY (VIT D DEFICIENCY, FRACTURES): VIT D 25 HYDROXY: 15.6 ng/mL — AB (ref 30.0–100.0)

## 2016-09-11 LAB — CK: CK TOTAL: 96 U/L (ref 49–397)

## 2016-09-11 MED ORDER — ENSURE ENLIVE PO LIQD
237.0000 mL | Freq: Two times a day (BID) | ORAL | Status: DC
  Administered 2016-09-11: 237 mL via ORAL

## 2016-09-11 MED ORDER — MAGNESIUM OXIDE 400 (241.3 MG) MG PO TABS
400.0000 mg | ORAL_TABLET | Freq: Once | ORAL | Status: AC
  Administered 2016-09-11: 400 mg via ORAL
  Filled 2016-09-11: qty 1

## 2016-09-11 MED ORDER — PRO-STAT SUGAR FREE PO LIQD
30.0000 mL | Freq: Two times a day (BID) | ORAL | Status: DC
  Administered 2016-09-11 – 2016-09-12 (×2): 30 mL via ORAL
  Filled 2016-09-11 (×2): qty 30

## 2016-09-11 MED ORDER — ADULT MULTIVITAMIN W/MINERALS CH
1.0000 | ORAL_TABLET | Freq: Every day | ORAL | Status: DC
  Administered 2016-09-12: 1 via ORAL
  Filled 2016-09-11: qty 1

## 2016-09-11 MED ORDER — LOSARTAN POTASSIUM 50 MG PO TABS
100.0000 mg | ORAL_TABLET | Freq: Every day | ORAL | Status: DC
  Administered 2016-09-11 – 2016-09-12 (×2): 100 mg via ORAL
  Filled 2016-09-11 (×2): qty 2

## 2016-09-11 NOTE — Evaluation (Addendum)
Physical Therapy Evaluation Patient Details Name: Cody Silva MRN: [id number] DOB: [birth year] Today's Date: 09/11/2016   History of Present Illness  Mr. Lengacher is a 71yo gentleman with PMH of CVA with some mild right hemiparesis remaining, TIA, DM2, HTN, CKD, HLD, OSA, PTSD who presented for a possible syncopal episode.   Clinical Impression  Patient presents with decreased balance and safety with mobility with history of falls at home (syncopal and non-syncopal).  Did note some drop in BP after standing 3 minutes.  Feel also needs skilled PT to address balance issues and generalized weakness prior to d/c home with family support.     Follow Up Recommendations Home health PT    Equipment Recommendations  None recommended by PT    Recommendations for Other Services       Precautions / Restrictions Precautions Precautions: Fall Precaution Comments: syncopal episodes along with some gradual worsening of balance per family pt relates at least one fall not syncope related      Mobility  Bed Mobility Overal bed mobility: Modified Independent             General bed mobility comments: increased time to come upright  Transfers Overall transfer level: Needs assistance Equipment used: None Transfers: Sit to/from Stand Sit to Stand: Min guard         General transfer comment: to steady as coming upright  Ambulation/Gait Ambulation/Gait assistance: Min guard;Supervision Ambulation Distance (Feet): 8 Feet Assistive device: None Gait Pattern/deviations: Step-through pattern;Decreased stride length;Wide base of support;Shuffle     General Gait Details: to door and back only as tech awaiting to take to EEG; some imbalance evident as well as pt reports feeling unsteady  Stairs            Wheelchair Mobility    Modified Rankin (Stroke Patients Only)       Balance Overall balance assessment: Needs assistance         Standing balance support: No upper  extremity supported Standing balance-Leahy Scale: Fair Standing balance comment: stood for 3 minutes for orthostatics without UE support, noted increased sway and pt did c/o feeling a little unsteady                             Pertinent Vitals/Pain Pain Assessment: No/denies pain         Orthostatic vitals        Supine BP 178/92           HR 67        Sitting BP 167/92          HR 69        Stand BP 162/92         HR 69        3 min BP 157/76         HR 71             Home Living Family/patient expects to be discharged to:: Private residence Living Arrangements: Spouse/significant other Available Help at Discharge: Family;Available 24 hours/day Type of Home: House Home Access: Stairs to enter Entrance Stairs-Rails: None Entrance Stairs-Number of Steps: 1 Home Layout: Two level;Bed/bath upstairs Home Equipment: Walker - 2 wheels;Cane - single point      Prior Function Level of Independence: Independent with assistive device(s)         Comments: uses cane     Hand Dominance   Dominant Hand: Right    Extremity/Trunk  Assessment   Upper Extremity Assessment Upper Extremity Assessment: Overall WFL for tasks assessed    Lower Extremity Assessment Lower Extremity Assessment: Overall WFL for tasks assessed       Communication   Communication: No difficulties  Cognition Arousal/Alertness: Awake/alert Behavior During Therapy: WFL for tasks assessed/performed Overall Cognitive Status: Within Functional Limits for tasks assessed                      General Comments General comments (skin integrity, edema, etc.): family in room, relate pt with more difficulty with balance over past 2-3 weeks    Exercises     Assessment/Plan    PT Assessment Patient needs continued PT services  PT Problem List Decreased balance;Decreased knowledge of use of DME;Decreased activity tolerance;Decreased safety awareness;Decreased mobility          PT  Treatment Interventions DME instruction;Stair training;Gait training;Therapeutic exercise;Patient/family education;Therapeutic activities;Functional mobility training    PT Goals (Current goals can be found in the Care Plan section)  Acute Rehab PT Goals Patient Stated Goal: To get better balance PT Goal Formulation: With patient/family Time For Goal Achievement: 09/15/16 Potential to Achieve Goals: Good    Frequency Min 3X/week   Barriers to discharge        Co-evaluation               End of Session   Activity Tolerance: Other (comment) (limited due to awaiting transport to EEG) Patient left: in bed;with call bell/phone within reach;with nursing/sitter in room;with family/visitor present      Functional Assessment Tool Used: Clinical Judgement Functional Limitation: Mobility: Walking and moving around Mobility: Walking and Moving Around Current Status [phone]): At least 20 percent but less than 40 percent impaired, limited or restricted Mobility: Walking and Moving Around Goal Status [phone]): At least 1 percent but less than 20 percent impaired, limited or restricted    Time: 1150-1216 PT Time Calculation (min) (ACUTE ONLY): 26 min   Charges:   PT Evaluation $PT Eval Moderate Complexity: 1 Procedure PT Treatments $Therapeutic Activity: 8-22 mins   PT G Codes:   PT G-Codes **NOT FOR INPATIENT CLASS** Functional Assessment Tool Used: Clinical Judgement Functional Limitation: Mobility: Walking and moving around Mobility: Walking and Moving Around Current Status ([id number]): At least 20 percent but less than 40 percent impaired, limited or restricted Mobility: Walking and Moving Around Goal Status [phone]): At least 1 percent but less than 20 percent impaired, limited or restricted    Reginia Naas 09/11/2016, 1:52 PM  Magda Kiel, [place] 09/11/2016

## 2016-09-11 NOTE — Care Management Note (Signed)
Case Management Note  Patient Details  Name: Cody Silva MRN: [id number] Date of Birth: 01/24/1946  Subjective/Objective:    Pt in with syncope. He is from home with his spouse.                Action/Plan: PT recommending HH therapy. CM following for d/c needs.   Expected Discharge Date:                  Expected Discharge Plan:  [place]  In-House Referral:     Discharge planning Services     Post Acute Care Choice:    Choice offered to:     DME Arranged:    DME Agency:     HH Arranged:    [place] Agency:     Status of Service:  In process, will continue to follow  If discussed at Long Length of Stay Meetings, dates discussed:    Additional Comments:  Pollie Friar, RN 09/11/2016, 3:16 PM

## 2016-09-11 NOTE — Progress Notes (Signed)
Speech Language Pathology  Name: Cody Silva MRN: [id number] DOB: 25-Jul-1946 Today's Date: 09/11/2016 Time:  -    Order received for swallow assessment. Pt passed swallow screen and states he is not having difficulty. Assessment not needed at this time.    Houston Siren 09/11/2016, 11:22 AM  Orbie Pyo Colvin Caroli.[employer] [phone]

## 2016-09-11 NOTE — Progress Notes (Signed)
EEG completed; results pending.    

## 2016-09-11 NOTE — Progress Notes (Signed)
Subjective: Feeling well this morning. Continues to have poor energy and appetite but is sitting up on side of bed eating his entire breakfast. He has no acute complaints, no pain this morning. He denies any recent issues with abdominal pain, N/V, constipation, dysuria, urinary obstruction. He does endorse urinary urgency and occasionally does not make it to the restroom, also getting up about 3 times overnight to urinate and takes Flomax. He states that his doctors at the [place] have checked his PSA and were not concerned.    Objective: Vital signs in last 24 hours: Vitals:   09/10/16 2047 09/10/16 2330 09/11/16 0152 09/11/16 0649  BP: (!) 150/73  (!) 152/84 (!) 173/91  Pulse: 68 67 69 67  Resp: 16 18 18 18   Temp: 98.2 F (36.8 C)  98.2 F (36.8 C) 98.6 F (37 C)  TempSrc: Oral  Oral Oral  SpO2: 100% 100% 100% 100%  Weight:        Intake/Output Summary (Last 24 hours) at 09/11/16 1037 Last data filed at 09/10/16 1907  Gross per 24 hour  Intake                0 ml  Output              400 ml  Net             -400 ml    Physical Exam General appearance: Elderly gentleman sitting up eating breakfast, in no acute distress, conversational HENT: Normocephalic, moist mucous membranes, tongue tremor noted Cardiovascular: Regular rate and rhythm, soft systolic murmur, rubs, gallops Respiratory/Chest: Mild expiratory wheezing bilaterally, normal work of breathing Abdomen: Soft, non-tender, non-distended Skin: Warm, dry, intact Neuro: Cranial nerves grossly intact, alert and oriented, strength intact throughout Psych: Appropriate affect  Labs / Imaging / Procedures: CBC Latest Ref Rng & Units 09/11/2016 09/10/2016 09/10/2016  WBC 4.0 - 10.5 K/uL 4.1 - 7.1  Hemoglobin 13.0 - 17.0 g/dL 10.4(L) 11.6(L) 11.5(L)  Hematocrit 39.0 - 52.0 % 30.4(L) 34.0(L) 33.9(L)  Platelets 150 - 400 K/uL 110(L) - 125(L)   BMP Latest Ref Rng & Units 09/11/2016 09/10/2016 09/10/2016  Glucose 65 - 99 mg/dL 136(H)  274(H) 271(H)  BUN 6 - 20 mg/dL 23(H) 22(H) 20  Creatinine 0.61 - 1.24 mg/dL 1.98(H) 2.00(H) 2.02(H)  Sodium 135 - 145 mmol/L 140 140 137  Potassium 3.5 - 5.1 mmol/L 3.5 3.2(L) 3.2(L)  Chloride 101 - 111 mmol/L 106 100(L) 102  CO2 22 - 32 mmol/L 28 - 28  Calcium 8.9 - 10.3 mg/dL 8.8(L) - 9.1   No results found.  Assessment/Plan: Cody Silva is a 71 y.o. man with PMH CVA, TIA, insulin-dependent T2DM, HTN, HLD, gout, CKD3B, PTSD, OSA, glaucoma is admitted for syncope workup.  Generalized fatigue, vs syncopal episode has had several admissions for similar presyncopal episodes and workup has been negative so far. TTE 12/2015, carotid US 09/2015 normal, loop recorder since 12/2015 with no document arrhythmias. He has a history of stroke in 2014 with mild residual R deficits, had a TIA in 09/2015, and has chronic microvascular changes visible on imaging. 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 Denies any palpitations, seizures, incontinence. Neuro exam intact aside from loss of vision in left eye due to previous surgery, sustained no injuries and no bruises on exam. Found asleep leaning again cabinet in kitchen - not consistent with syncope/fall from standing. Poorly managing his medications at home, also has OSA and is noncompliant with CPAP and  falling asleep during conversation today. Cardiac etiology seems unlikely, most likely poor medication management vs excess sleepiness in setting of CPAP noncompliance.  - Check orthostatics - EEG read pending - Replete electrolyte abnormalities, check Mg, Phos - CPAP QHS - Gradually restarting home medications - Will likely order [facility] RN for medication managment  Unintentional weight loss, 206 lbs down from 232 in Oct 2017. Endorses fatigue and poor po intake due to loss of appetite. States the he has lost "3 inches off his waist-line." Denies hematochezia or melena. Has history of lower GIB in 2016 following polypectomies that  resolved and Plavix was safely restarted. - Check UA - Nutrition consult  T2DM, insulin-dependent, last Hb A1c 7.9 in 12/2015 - SSI moderate, CBG TID AC HS  - CM diet - Hb A1c 7.1   Hypertension, BP up to 181/101 this admit, on numerous antihypertensives at home including weekly clonidine patch, forgot to change it yesterday - Taper/discontinue Clonidine by discharge, reduced to 0.1 mg patch currently - Continue home Coreg 50 mg BID - Continue home Amlodipine 10 mg daily - Resume home Losartan 100 mg daily - Holding home Spironolactone  Thrombocytopenia, chronic mild, platelets currently 125. History of agent orange exposure puts him at risk for developing MDS - Trend CBC   History of CVA - continue Plavix Glaucoma - continue home eye drops Hyperlipidemia - Lipitor  Functional status, questionably independent in ADLs, dependent in IADLs at home  - PT OT eval and treat - Will need HH RN for medication management and home assessment  Dispo: Anticipated discharge in approximately 1-2 day(s).   LOS: 0 days   Asencion Partridge, MD 09/11/2016, 10:37 AM Pager: [phone]

## 2016-09-11 NOTE — Progress Notes (Addendum)
  Date: 09/11/2016  Patient name: Cody Silva  Medical record number: [id number]  Date of birth: [birth year]   I have seen and evaluated Cody Silva and discussed their care with the Residency Team. Briefly, Mr. 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  He reports getting up overnight to get a sandwhich, but then he woke up on the kitchen floor.  He does not remember anything in the interim.  No B/B loss, no twitching, no pain or apparent injury.  We did not get to speak to his family yet, but story from wife in the ED noted slightly different occurrences.  She noted that Mr. Abercrombie got up from bed and went to find his phone.  When he did not return, she went to check on him and found him sleeping on the kitchen floor.  He seems confused to her.  By the time we saw him, he was back to his baseline.  Further symptoms include weight loss, lack of appetite, urgency to pee and nocturia.  He reports having PSA checked at the [place].  He is also on tamsulosin.  He also had a colonoscopy 2 years ago with 7 polyps removed.  Finally, he had a TIA in January.  This could certainly be another TIA.  Loop recorder placed in April 2017 for a syncopal event showed no arrhythmias.  CT head/neck without acute change.    Fam Hx, and/or Soc Hx : Family: DM, Heart disease, Stroke.  Social: Never smoker   Vitals:   09/11/16 0152 09/11/16 0649  BP: (!) 152/84 (!) 173/91  Pulse: 69 67  Resp: 18 18  Temp: 98.2 F (36.8 C) 98.6 F (37 C)   Physical Exam:  Gen: Sitting on side of bed, eating breakfast, NAD Eyes: Anicteric sclerae, mild conjunctival injection CV: RR, NR, no murmur noted Pulm: CTAB, no wheezing Skin: Multiple seb K's on the back, no wounds noted Abd: Soft, +BS Ext: Edema to ankles, pitting with sock indentations, no tenderness to palpation, legs appeared  symmetric Neuro: Strength 5/5 throughout, no apparent weakness on the right.  He has a tongue tremor which he has noted since his stroke.  Otherwise, CN's intact.     Assessment and Plan: I have seen and evaluated the patient as outlined above. I agree with the formulated Assessment and Plan as detailed in the residents' note, with the following changes:   1. Fatigue vs. Syncope - Unclear what caused him to wake on the floor of his kitchen. DDx includes syncope (he has had a significant cardiology work up without cause found, loop recorder interrogated this admission), neurological syncope or stroke (CT head without acute stroke), medication effect, delerium.  - EEG pending - Troponin have been negative, he reports no cardiac symptoms - He has had weight loss, so checking vitamin levels, TSH, HIV, HCV, CK etc - Add CPAP - Given possible syncope, will add back blood pressure medications slowly - Orthostatics - medication review - discontinue non essential meds  2. Weight loss - Based on his report, he has had adequate cancer screening.   - Check TSH and above labs - Nutrition consult  3. Functional status - PT/OT  Other issues per Dr. Durenda Age daily note.   Sid Falcon, MD 1/8/201810:30 AM

## 2016-09-11 NOTE — Progress Notes (Signed)
OT Cancellation Note  Patient Details Name: KHAM ZUCKERMAN MRN: [id number] DOB: 13-Feb-1946   Cancelled Treatment:    Reason Eval/Treat Not Completed: Patient at procedure or test/ unavailable  Vonita Moss   OTR/L Pager: [phone] Office: [phone] .  09/11/2016, 12:30 PM

## 2016-09-11 NOTE — Care Management Obs Status (Signed)
[facility] NOTIFICATION   Patient Details  Name: Cody Silva MRN: [id number] Date of Birth: 03/23/1946   Medicare Observation Status Notification Given:  Yes    Pollie Friar, RN 09/11/2016, 12:45 PM

## 2016-09-11 NOTE — Progress Notes (Addendum)
Initial Nutrition Assessment  DOCUMENTATION CODES:   Non-severe (moderate) malnutrition in context of chronic illness  INTERVENTION:  Provide Ensure Enlive po BID, each supplement provides 350 kcal and 20 grams of protein Provide 30 ml Pro-Stat BID, each dose provides 15 grams of protein and 100 kcal Provide Multivitamin with minerals daily Discussed ways to increase calorie and protein intake   Recommend starting 2000 IU Vitamin D3 (cholecalciferol) daily   NUTRITION DIAGNOSIS:   Malnutrition related to poor appetite as evidenced by percent weight loss, energy intake < 75% for > or equal to 1 month, moderate depletions of muscle mass.   GOAL:   Patient will meet greater than or equal to 90% of their needs   MONITOR:   PO intake, Supplement acceptance, Labs, Weight trends, Skin, I & O's  REASON FOR ASSESSMENT:   Consult Assessment of nutrition requirement/status  ASSESSMENT:   71yo gentleman with PMH of CVA with some mild right hemiparesis remaining, TIA, DM2, HTN, CKD, HLD, OSA, PTSD who presented for a possible syncopal episode.   Pt states that for the past 3 months his appetite has been very poor. He has lost from 230 lbs to 205 lbs per weight history- 12% weight loss. Pt's lunch tray at bedside with ~40% consumed. Pt states this is a similar amount he has been eating per meal the past few months, and he has only been eating 1-2 meals daily. He tried drinking Boost supplements, but states that is went right through him. He states that before his poor appetite began he had been feeling weak and tired most days. Pt had mild fat wasting and moderate muscle wasting per nutrition-focused physical exam.  RD discussed the importance of nutrition and ways to increase calories and protein in his diet. Discussed various supplement options. Pt agreeable to trying various supplements while admitted.   Labs: Low vitamin D, low hemoglobin  Diet Order:  Diet Carb Modified Fluid  consistency: Thin; Room service appropriate? Yes  Skin:  Wound (see comment) (diabetic ulcer on right foot)  Last BM:  1/6  Height:   Ht Readings from Last 1 Encounters:  09/11/16 5\' 10"  (1.778 m)    Weight:   Wt Readings from Last 1 Encounters:  09/10/16 205 lb 11 oz (93.3 kg)    Ideal Body Weight:  75.45 kg  BMI:  Body mass index is 29.51 kg/m.  Estimated Nutritional Needs:   Kcal:  2200-2400  Protein:  95-100 grams  Fluid:  2.4 L/day  EDUCATION NEEDS:   No education needs identified at this time  [place], CSP, LDN Inpatient Clinical Dietitian Pager: [phone] After Hours Pager: [phone]

## 2016-09-11 NOTE — Procedures (Signed)
ELECTROENCEPHALOGRAM REPORT  Date of Study: 09/11/2016  Patient's Name: Cody Silva MRN: [id number] Date of Birth: 24-Dec-1945  Referring Provider: Burgess Estelle, MD  Clinical History: 71 year old man with history of stroke who presents for syncopal episode.  Medications: insulin aspart (novoLOG) injection 0-15 Unitslatanoprost (XALATAN) 0.005 % ophthalmic solution 1 drop  losartan (COZAAR) tablet 100 mg  magnesium oxide (MAG-OX) tablet 400 mg  ondansetron (ZOFRAN) injection 4 mg  senna-docusate (Senokot-S) tablet 1 tablet   Technical Summary: A multichannel digital EEG recording measured by the international 10-20 system with electrodes applied with paste and impedances below 5000 ohms performed in our laboratory with EKG monitoring in an awake and drowsy patient.  Hyperventilation was not performed.  Photic stimulation was performed.  The digital EEG was referentially recorded, reformatted, and digitally filtered in a variety of bipolar and referential montages for optimal display.    Description: The patient is awake and drowsy during the recording.  During maximal wakefulness, there is a symmetric, medium voltage 9 Hz posterior dominant rhythm that attenuates with eye opening.  The record is symmetric.  During drowsiness and sleep, there is an increase in theta slowing of the background.  Stage 2 sleep was not seen.  Photic stimulation did not elicit any abnormalities.  There were no epileptiform discharges or electrographic seizures seen.    EKG lead was unremarkable.  Impression: This awake and drowsy EEG is normal.    Clinical Correlation: A normal EEG does not exclude a clinical diagnosis of epilepsy.  If further clinical questions remain, prolonged EEG may be helpful.  Clinical correlation is advised.   Metta Clines, DO

## 2016-09-12 DIAGNOSIS — R634 Abnormal weight loss: Secondary | ICD-10-CM

## 2016-09-12 DIAGNOSIS — E1122 Type 2 diabetes mellitus with diabetic chronic kidney disease: Secondary | ICD-10-CM

## 2016-09-12 DIAGNOSIS — I10 Essential (primary) hypertension: Secondary | ICD-10-CM

## 2016-09-12 DIAGNOSIS — N183 Chronic kidney disease, stage 3 (moderate): Secondary | ICD-10-CM

## 2016-09-12 DIAGNOSIS — Z9989 Dependence on other enabling machines and devices: Secondary | ICD-10-CM

## 2016-09-12 DIAGNOSIS — G4733 Obstructive sleep apnea (adult) (pediatric): Secondary | ICD-10-CM

## 2016-09-12 LAB — GLUCOSE, CAPILLARY
GLUCOSE-CAPILLARY: 176 mg/dL — AB (ref 65–99)
GLUCOSE-CAPILLARY: 212 mg/dL — AB (ref 65–99)
GLUCOSE-CAPILLARY: 178 mg/dL — AB (ref 65–99)

## 2016-09-12 LAB — BASIC METABOLIC PANEL
Anion gap: 7 (ref 5–15)
BUN: 22 mg/dL — AB (ref 6–20)
CALCIUM: 9 mg/dL (ref 8.9–10.3)
CO2: 27 mmol/L (ref 22–32)
CREATININE: 1.89 mg/dL — AB (ref 0.61–1.24)
Chloride: 107 mmol/L (ref 101–111)
GFR calc Af Amer: 40 mL/min — ABNORMAL LOW (ref >60)
GFR, EST NON AFRICAN AMERICAN: 35 mL/min — AB (ref >60)
Glucose, Bld: 161 mg/dL — ABNORMAL HIGH (ref 65–99)
Potassium: 3.3 mmol/L — ABNORMAL LOW (ref 3.5–5.1)
SODIUM: 141 mmol/L (ref 135–145)

## 2016-09-12 MED ORDER — VITAMIN D 1000 UNITS PO TABS: 2000.0000 [IU] | ORAL_TABLET | Freq: Every day | ORAL | Status: DC

## 2016-09-12 MED ORDER — SPIRONOLACTONE 25 MG PO TABS
25.0000 mg | ORAL_TABLET | Freq: Every day | ORAL | Status: DC
  Administered 2016-09-12: 25 mg via ORAL
  Filled 2016-09-12: qty 1

## 2016-09-12 MED ORDER — ATORVASTATIN CALCIUM 20 MG PO TABS: 20.0000 mg | ORAL_TABLET | Freq: Every day | ORAL | 3 refills | Status: DC

## 2016-09-12 MED ORDER — CHOLECALCIFEROL 50 MCG (2000 UT) PO TABS: 2000.0000 [IU] | ORAL_TABLET | Freq: Every day | ORAL | 3 refills | Status: DC

## 2016-09-12 MED ORDER — ENSURE ENLIVE PO LIQD: 237.0000 mL | Freq: Two times a day (BID) | ORAL | 12 refills | Status: DC

## 2016-09-12 MED ORDER — PRO-STAT SUGAR FREE PO LIQD: 30.0000 mL | Freq: Two times a day (BID) | ORAL | 3 refills | Status: DC

## 2016-09-12 MED ORDER — POTASSIUM CHLORIDE CRYS ER 20 MEQ PO TBCR
40.0000 meq | EXTENDED_RELEASE_TABLET | Freq: Once | ORAL | Status: AC
  Administered 2016-09-12: 40 meq via ORAL
  Filled 2016-09-12: qty 2

## 2016-09-12 NOTE — Discharge Instructions (Signed)
Syncope Introduction Syncope is when you lose temporarily pass out (faint). Signs that you may be about to pass out include:  Feeling dizzy or light-headed.  Feeling sick to your stomach (nauseous).  Seeing all white or all black.  Having cold, clammy skin. If you passed out, get help right away. Call your local emergency services (911 in the [place] Do not drive yourself to the hospital. Follow these instructions at home: Pay attention to any changes in your symptoms. Take these actions to help with your condition:  Have someone stay with you until you feel stable.  Do not drive, use machinery, or play sports until your doctor says it is okay.  Keep all follow-up visits as told by your doctor. This is important.  If you start to feel like you might pass out, lie down right away and raise (elevate) your feet above the level of your heart. Breathe deeply and steadily. Wait until all of the symptoms are gone.  Drink enough fluid to keep your pee (urine) clear or pale yellow.  If you are taking blood pressure or heart medicine, get up slowly and spend many minutes getting ready to sit and then stand. This can help with dizziness.  Take over-the-counter and prescription medicines only as told by your doctor. Get help right away if:  You have a very bad headache.  You have unusual pain in your chest, tummy, or back.  You are bleeding from your mouth or rectum.  You have black or tarry poop (stool).  You have a very fast or uneven heartbeat (palpitations).  It hurts to breathe.  You pass out once or more than once.  You have jerky movements that you cannot control (seizure).  You are confused.  You have trouble walking.  You are very weak.  You have vision problems. These symptoms may be an emergency. Do not wait to see if the symptoms will go away. Get medical help right away. Call your local emergency services (911 in the [place] Do not drive yourself to the hospital.    This information is not intended to replace advice given to you by your health care provider. Make sure you discuss any questions you have with your health care provider. Document Released: 02/07/2008 Document Revised: 01/27/2016 Document Reviewed: 05/05/2015  2017 [facility] Stay Proper nutrition can help your body recover from illness and injury.   Foods and beverages high in protein, vitamins, and minerals help rebuild muscle loss, promote healing, & reduce fall risk.   In addition to eating healthy foods, a nutrition shake is an easy, delicious way to get the nutrition you need during and after your hospital stay  It is recommended that you continue to drink 2 bottles per day of:       Ensure Enlive (350 calories, 20 grams protein) for at least 1 month (30 days) after your hospital stay. Use Glucerna Shakes instead if Ensure causes high blood glucose.    Tips for adding a nutrition shake into your routine: As allowed, drink one with vitamins or medications instead of water or juice Enjoy one as a tasty mid-morning or afternoon snack Drink cold or make a milkshake out of it Drink one instead of milk with cereal or snacks Use as a coffee creamer   Available at the following grocery stores and pharmacies:           * [employer]          *  [place] visit: www.ensure.com/join or http://dawson-may.com/   Suggested Substitutions Ensure Plus = Boost Plus = Carnation Breakfast Essentials = Boost Compact Ensure Active Clear = Boost Breeze Glucerna Shake = Boost Glucose Control = Carnation Breakfast Essentials SUGAR FREE  Scarlette Ar RD, CSP, LDN Inpatient Clinical Dietitian Pager: [phone] After Hours Pager: [phone]    '

## 2016-09-12 NOTE — Care Management Note (Signed)
Case Management Note  Patient Details  Name: Cody Silva MRN: [id number] Date of Birth: Jul 03, 1946  Subjective/Objective:                    Action/Plan: Plan is for patient to discharge home with orders for [facility] services. CM met with the patient and his wife and provided a list of [place] agencies. They selected [facility]. Mary with [facility] notified and accepted the referral. Wife to provide transportation home.   Expected Discharge Date:                  Expected Discharge Plan:  [place]  In-House Referral:     Discharge planning Services  CM Consult  Post Acute Care Choice:  Home Health Choice offered to:  Patient, Spouse  DME Arranged:    DME Agency:     HH Arranged:  PT, RN [place] Agency:  [facility] (now [facility])  Status of Service:  In process, will continue to follow  If discussed at Long Length of Stay Meetings, dates discussed:    Additional Comments:  Pollie Friar, RN 09/12/2016, 3:50 PM

## 2016-09-12 NOTE — Consult Note (Addendum)
[place] Nurse wound consult note Reason for Consult: Consult requested for right foot wound; pt states he is followed by the outpatient [place] clinic; they perform serial debridements and he applies antibiotic ointment and a dressing to protect the site. 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 No odor, drainage, or fluctuance.   Dressing procedure/placement/frequency: Please re-consult if further assistance is needed. Foam dressing to protect and promote healing.  Pt states he will follow-up with the [place] clinic after discharge. Discussed plan of care and he denies further questions. Please re-consult if further assistance is needed.  Thank-you,  Julien Girt MSN, [place], [place], [place], [place]

## 2016-09-12 NOTE — Evaluation (Signed)
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    Clinical Impression   PTA, pt was independent with assistive devices for ADL and was using a cane for functional mobility. Pt currently requires supervision for safety with ADL. No complaints of lightheadedness during all ADL and functional mobility on OT evaluation. Pt lives with family and will have 52 hour assistance available at home. Educated pt on energy conservation strategies, use of 3-in-1 as shower seat for safety in shower, and fall prevention strategies in the home. Pt and family report and demonstrate understanding. Pt and family report functioning nearly at baseline for ADL. No further acute OT needs identified and no OT follow-up recommended at this time. OT will sign off. Pt has all DME needs met.    Follow Up Recommendations  No OT follow up;Supervision/Assistance - 24 hour    Equipment Recommendations  None recommended by OT    Recommendations for Other Services       Precautions / Restrictions Precautions Precautions: Fall Precaution Comments: syncopal episodes along with some gradual worsening of balance per family pt relates at least one fall not syncope related Restrictions Weight Bearing Restrictions: No      Mobility Bed Mobility Overal bed mobility: Modified Independent                Transfers Overall transfer level: Modified independent Equipment used: None Transfers: Sit to/from Stand Sit to Stand: Modified independent (Device/Increase time)              Balance Overall balance assessment: Needs assistance   Sitting balance-Leahy Scale: Normal Sitting balance - Comments: donned shoes in sitting    Standing balance support: No upper extremity supported Standing balance-Leahy Scale: Good Standing balance comment: stood to wash hands, brush teeth after toileting independently; noted difficulty with fine motor of cap on toothpaste and three attempts to get paste on brush                            ADL Overall ADL's : Needs assistance/impaired     Grooming: Supervision/safety;Standing;Wash/dry hands   Upper Body Bathing: Set up;Sitting   Lower Body Bathing: Supervison/ safety;Sit to/from stand   Upper Body Dressing : Set up;Sitting   Lower Body Dressing: Supervision/safety;Sit to/from stand   Toilet Transfer: Supervision/safety;BSC;Ambulation   Toileting- Water quality scientist and Hygiene: Supervision/safety;Sit to/from stand   Tub/ Shower Transfer: Walk-in shower;Supervision/safety;Ambulation;3 in 1   Functional mobility during ADLs: Supervision/safety General ADL Comments: Pt able to complete ADL at supervision level. Educated on benefits of sitting during ADL or having seat available in case of dizziness. Educated on fall prevention and energy conservation.     Vision Vision Assessment?: No apparent visual deficits   Perception     Praxis      Pertinent Vitals/Pain Pain Assessment: No/denies pain     Hand Dominance Right   Extremity/Trunk Assessment Upper Extremity Assessment Upper Extremity Assessment: Overall WFL for tasks assessed   Lower Extremity Assessment Lower Extremity Assessment: Overall WFL for tasks assessed       Communication Communication Communication: No difficulties   Cognition Arousal/Alertness: Awake/alert Behavior During Therapy: WFL for tasks assessed/performed Overall Cognitive Status: Within Functional Limits for  tasks assessed                     General Comments       Exercises       Shoulder Instructions      Home Living Family/patient expects to be discharged to:: Private residence Living  Arrangements: Spouse/significant other Available Help at Discharge: Family;Available 24 hours/day Type of Home: House Home Access: Stairs to enter [employer] of Steps: 1 Entrance Stairs-Rails: None Home Layout: Two level;Bed/bath upstairs Alternate Level Stairs-Number of Steps: flight Alternate Level Stairs-Rails: Left Bathroom Shower/Tub: Occupational psychologist: Standard     Home Equipment: Environmental consultant - 2 wheels;Cane - single point;Bedside commode          Prior Functioning/Environment Level of Independence: Independent with assistive device(s)        Comments: uses cane        OT Problem List: Impaired balance (sitting and/or standing);Decreased safety awareness;Decreased knowledge of use of DME or AE   OT Treatment/Interventions:      OT Goals(Current goals can be found in the care plan section) Acute Rehab OT Goals Patient Stated Goal: To get better balance OT Goal Formulation: With patient/family Time For Goal Achievement: 09/19/16 Potential to Achieve Goals: Good  OT Frequency:     Barriers to D/C:            Co-evaluation              End of Session    Activity Tolerance: Patient tolerated treatment well Patient left: in bed;with call bell/phone within reach;with family/visitor present   Time: 9201-0071 OT Time Calculation (min): 24 min Charges:  OT General Charges $OT Visit: 1 Procedure OT Evaluation $OT Eval Moderate Complexity: 1 Procedure OT Treatments $Self Care/Home Management : 8-22 mins  Norman Herrlich, OTR/L [phone] 09/12/2016, 4:58 PM

## 2016-09-12 NOTE — Progress Notes (Signed)
  Date: 09/12/2016  Patient name: Cody Silva  Medical record number: [id number]  Date of birth: [birth year]   I have personally seen and evaluated this patient and I have discussed the plan of care with the house staff. Please see Dr. Durenda Age note for complete details. I concur with his findings.   Plan for discharge today.  Close follow up at the [place].    Sid Falcon, MD 09/12/2016, 4:30 PM

## 2016-09-12 NOTE — Progress Notes (Signed)
Pt d/c to home by car with family. Assessment stable. All questions answered. 

## 2016-09-12 NOTE — Progress Notes (Signed)
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     PT Comments    Patient able to ambulate in hallway and, though needing supervision on stairs feel safe with family support for d/c home with follow up HHPT.  Noted some hand muscle wasting and difficulty with fine motor tasks.  Noted plan for d/c home today.  Will d/c PT due to planned d/c home today.   Follow Up Recommendations  Home health PT     Equipment Recommendations  None recommended by PT    Recommendations for Other Services       Precautions / Restrictions Precautions Precautions: Fall    Mobility  Bed Mobility Overal bed mobility: Modified Independent                Transfers Overall transfer level: Modified independent   Transfers: Sit to/from Stand Sit to Stand: Modified independent (Device/Increase time)            Ambulation/Gait Ambulation/Gait assistance: Supervision Ambulation Distance (Feet): 200 Feet Assistive device: None (occasionally grabbing wall rail) Gait Pattern/deviations: Step-through pattern;Decreased stride length;Wide base of support;Shuffle     General Gait Details: increased time, pt with wide BOS and shuffling gait wearing his shoes from home; utilizing wall rail intermittently, uses cane at home   Stairs Stairs: Yes   Stair Management: One rail Right;Alternating pattern;Step to pattern;Forwards Number of Stairs: 10 General stair comments: weakness in legs noted with one episode of stepping up onto next step and then rocking back onto foot on lower step; educated to perform step to sequence as utilized step through sequence and concern for some imbalance and h/o syncopal events  Wheelchair  Mobility    Modified Rankin (Stroke Patients Only)       Balance Overall balance assessment: Needs assistance   Sitting balance-Leahy Scale: Normal Sitting balance - Comments: donned shoes in sitting   Standing balance support: No upper extremity supported Standing balance-Leahy Scale: Good Standing balance comment: stood to wash hands, brush teeth after toileting independently; noted difficulty with fine motor of cap on toothpaste and three attempts to get paste on brush                    Cognition Arousal/Alertness: Awake/alert Behavior During Therapy: WFL for tasks assessed/performed Overall Cognitive Status: Within Functional Limits for tasks assessed                      Exercises      General Comments        Pertinent Vitals/Pain Pain Assessment: No/denies pain    Home Living                      Prior Function            PT Goals (current goals can now be found in the care plan section) Progress towards PT goals: Goals met/education completed, patient discharged from PT    Frequency    Min 3X/week      PT Plan Current plan remains appropriate    Co-evaluation             End of Session   Activity Tolerance: Patient tolerated treatment well Patient  left: in bed;with call bell/phone within reach;with family/visitor present     Time: 1779-3903 PT Time Calculation (min) (ACUTE ONLY): 24 min  Charges:  $Gait Training: 8-22 mins $Therapeutic Activity: 8-22 mins                    G Codes:      Reginia Naas Oct 02, 2016, 4:37 PM  Magda Kiel, [place] 10-02-2016

## 2016-09-12 NOTE — Discharge Summary (Signed)
Name: Cody Silva MRN: [id number] DOB: May 30, 1946 71 y.o. PCP: [facility]  Date of Admission: 09/10/2016  5:37 AM Date of Discharge: 09/12/2016 Attending Physician: Cody Falcon, MD  Discharge Diagnosis: 1. Syncope and collapse 2. Hypertension 3. Weight loss 4. Chronic kidney disease  Principal Problem:   Syncope and collapse Active Problems:   Hypertension   CKD stage 3 due to type 2 diabetes mellitus (HCC)   Malnutrition of moderate degree   OSA (obstructive sleep apnea)   Weight loss   Discharge Medications: Allergies as of 09/12/2016      Reactions   Metformin And Related Diarrhea      Medication List    STOP taking these medications   BC HEADACHE POWDER PO   cloNIDine 0.3 mg/24hr patch Commonly known as:  CATAPRES - Dosed in mg/24 hr   simvastatin 40 MG tablet Commonly known as:  ZOCOR     TAKE these medications   acetaminophen 500 MG tablet Commonly known as:  TYLENOL Take 1 tablet (500 mg total) by mouth every 6 (six) hours as needed for mild pain, moderate pain, fever or headache.   albuterol 108 (90 Base) MCG/ACT inhaler Commonly known as:  PROVENTIL HFA;VENTOLIN HFA Inhale 2 puffs into the lungs every 6 (six) hours as needed for wheezing.   amLODipine 10 MG tablet Commonly known as:  NORVASC Take 10 mg by mouth daily.   atorvastatin 20 MG tablet Commonly known as:  LIPITOR Take 1 tablet (20 mg total) by mouth daily at 6 PM.   carvedilol 25 MG tablet Commonly known as:  COREG Take 50 mg by mouth 2 (two) times daily. Reported on 09/14/2015   Cholecalciferol 2000 units Tabs Take 1 tablet (2,000 Units total) by mouth daily.   citalopram 20 MG tablet Commonly known as:  CELEXA Take 20 mg by mouth daily.   clopidogrel 75 MG tablet Commonly known as:  PLAVIX Take 1 tablet (75 mg total) by mouth at bedtime.   eucerin cream Apply 1 application topically daily.   feeding supplement (ENSURE ENLIVE) Liqd Take 237 mLs by  mouth 2 (two) times daily after a meal.   feeding supplement (PRO-STAT SUGAR FREE 64) Liqd Take 30 mLs by mouth 2 (two) times daily.   fluocinonide cream 0.05 % Commonly known as:  LIDEX Apply 1 application topically daily.   fluticasone 50 MCG/ACT nasal spray Commonly known as:  FLONASE Place 2 sprays into the nose daily as needed for allergies.   gabapentin 300 MG capsule Commonly known as:  NEURONTIN Take 300 mg by mouth daily as needed (leg pain).   GNP GINGKO BILOBA EXTRACT PO Take 1 capsule by mouth daily.   insulin aspart 100 UNIT/ML injection Commonly known as:  novoLOG Inject 5-15 Units into the skin 3 (three) times daily with meals. Per sliding scale   insulin glargine 100 UNIT/ML injection Commonly known as:  LANTUS Inject 0-30 Units into the skin at bedtime.   latanoprost 0.005 % ophthalmic solution Commonly known as:  XALATAN Place 1 drop into both eyes at bedtime.   loratadine 10 MG tablet Commonly known as:  CLARITIN Take 10 mg by mouth daily as needed for allergies.   losartan 100 MG tablet Commonly known as:  COZAAR Take 100 mg by mouth daily.   MULTIVITAMIN PO Take 1 tablet by mouth daily.   polyvinyl alcohol 1.4 % ophthalmic solution Commonly known as:  LIQUIFILM TEARS Place 1 drop into both eyes 5 (five) times  daily as needed (for dry eyes).   spironolactone 25 MG tablet Commonly known as:  ALDACTONE Take 25 mg by mouth daily.   UNKNOWN TO PATIENT Place 1 drop into both eyes every morning. Other eye drop, unknown name  (received from [place]       Disposition and follow-up:   Cody Silva was discharged from [facility] in Stable condition.  At the hospital follow up visit please address:  1.  Syncopal events - assess for syncope or TIA-like events, or potential sustained injuries. Concern that these may be due to CPAP noncompliance in setting of OSA vs medication administration issues (forgetting to take,  double-dosing, etc).  Hypertension , with orthostatic vitals - assess for orthostatic symptoms, blood pressure control and compliance with medications. Instructed to take his time sitting up and moving from sitting to standing. Tapered and discontinued weekly Clonidine patch during hospital stay. Continued Amlodipine 10mg , Carvedilol 50mg  BID, Losartan 100mg , and Spironolactone 25mg  daily.   Weight loss - has lost 25 lbs in 3 months, with moderate malnutrition per nutritionist assessment, assess compliance wtih nutritional / caloric supplements, history of polyps on colonoscopy in 2016, needs to follow up with GI for reassessment  Chronic kidney disease - creatinine elevated to 2 on admission, trended down to 1.89 with hydration - assess hydration status and evaluate for improvement or decline in renal function  2.  Labs / imaging needed at time of follow-up: BMP  3.  Pending labs/ test needing follow-up: None  Follow-up Appointments: Follow-up Information    [facility]. Schedule an appointment as soon as possible for a visit.   Why:  Make an appointment to follow up within two weeks. Contact information: [place] [id number] [phone]           Hospital Course by problem list: Principal Problem:   Syncope and collapse Active Problems:   Hypertension   CKD stage 3 due to type 2 diabetes mellitus (HCC)   Malnutrition of moderate degree   OSA (obstructive sleep apnea)   Weight loss   1. Syncope and collapse Cody Silva is a 71 y.o. gentleman with PMH CVA, TIA, insulin-dependent T2DM, HTN, HLD, gout, CKD3B, PTSD, OSA not using CPAP, glaucoma who presented on 09/10/2016 after possible syncopal episode at home. He reported waking up in the middle of the night to make a sandwich and woke up on the floor of his kitchen. He has been experiencing generalized weakness, loss of appetite, weight loss, and sleeping all day at home. Admits to CPAP  noncompliance and also has poor medication management, often forgetting doses or accidentally taking twice. Neuro and cardiac workup were unremarkable, no trauma/injuries, no findings on EKG, extensive lab testing, loop recorder interrogation, brain imaging. EEG was normal. He has had several admissions for presyncopal episodes and cardiac workups have been negative. 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 He is at risk for TIA. Although he remained hypertensive while gradually restarting home medications, he experienced a diastolic blood pressure drop > 20 mm Hg with standing at 3 minutes consistent with orthostatic vitals, advised to compensate for this behaviorally. Stable for discharge on 1/9 with home health PT and RN for medication management and advised to wear CPAP nightly.   2. Weight loss - reports 25 lb weight loss over 3 months, 206 lbs from 232 lbs 3 months  ago. Unclear etiology, no new anemia or evidence of blood loss, but does have a history of colon polyps and underwent extensive polypectomy in 2016. Also hypersomnolent at home, sleeping all day and not eating. Possible related to CPAP noncomplaince but occult malignancy remains a concern. Should follow up with GI as a outpatient after discharge. Evaluated by dietician, deemed to have moderate malnutrition status and started on Ensure BID, Pro-Stat BID, multivitamin, vitamin D supplements.    3. Hypertension - initially hypertensive up to 180/100s with poor medication compliance. He was gradually restarted on home medications with the exception of clonidine weekly patch (0.3 mg) which was tapered and discontinued by day of discharge. Orthostatic vital check revealed > 20 mm Hg drop of diastolic blood pressure with prolonged standing at 3 minutes. Advised to gradually transition when sitting up and standing.   4. Chronic kidney disease -  Creatinine elevated at 2.00 on admission, unclear baseline and seems to be gradually rising from 1.6 in 12/2015. Remained elevated but slightly improved to 1.89 with IV fluid hydration by day of discharge.   Discharge Vitals:   BP (!) 163/75 (BP Location: Right Arm)   Pulse 60   Temp 97.9 F (36.6 C) (Oral)   Resp 20   Ht 5\' 10"  (1.778 m) Comment: per pt report  Wt 209 lb 1.6 oz (94.8 kg)   SpO2 100%   BMI 30.00 kg/m    Pertinent Labs, Studies, and Procedures:   Loop recorder interrogated and negative for any documented arrhythmias  Dg Chest 2 View  Result Date: 09/10/2016 CLINICAL DATA:  Fall yesterday with chest pain, initial encounter EXAM: CHEST  2 VIEW COMPARISON:  12/22/2015 FINDINGS: Cardiac shadow is again mildly enlarged but stable. A loop recorder is now noted in place. Lungs are well aerated bilaterally. No focal infiltrate or sizable effusion is seen. No acute bony abnormality is noted. IMPRESSION: No acute abnormality noted. Electronically Signed   By: Inez Catalina M.D.   On: 09/10/2016 07:50   Ct Head Wo Contrast  Result Date: 09/10/2016 CLINICAL DATA:  Recent fall this morning, initial encounter EXAM: CT HEAD WITHOUT CONTRAST CT CERVICAL SPINE WITHOUT CONTRAST TECHNIQUE: Multidetector CT imaging of the head and cervical spine was performed following the standard protocol without intravenous contrast. Multiplanar CT image reconstructions of the cervical spine were also generated. COMPARISON:  07/25/2016 FINDINGS: CT HEAD FINDINGS Brain: Mild atrophic and chronic white matter ischemic changes are again noted. No findings to suggest acute hemorrhage, acute infarction or space-occupying mass lesion are noted. Vascular: No hyperdense vessel or unexpected calcification. Skull: Normal. Negative for fracture or focal lesion. Sinuses/Orbits: No acute finding. Other: None. CT CERVICAL SPINE FINDINGS Alignment: Within normal limits. Skull base and vertebrae: 7 cervical segments are well  visualized. Vertebral body height is well maintained. Mild osteophytic changes and facet hypertrophic changes are seen. No acute fracture or acute facet abnormality is noted. Soft tissues and spinal canal: Within normal limits for the patient's age. Disc levels: Mild disc space narrowing is noted at C7-T1. No significant disc pathology is noted. Upper chest: Within normal limits. IMPRESSION: CT of the head: Chronic atrophic and ischemic changes stable from the prior exam. CT of the cervical spine: Multilevel degenerative change without acute abnormality. Electronically Signed   By: Inez Catalina M.D.   On: 09/10/2016 07:36   Ct Cervical Spine Wo Contrast  Result Date: 09/10/2016 CLINICAL DATA:  Recent fall this morning, initial encounter EXAM: CT HEAD WITHOUT CONTRAST CT CERVICAL SPINE  WITHOUT CONTRAST TECHNIQUE: Multidetector CT imaging of the head and cervical spine was performed following the standard protocol without intravenous contrast. Multiplanar CT image reconstructions of the cervical spine were also generated. COMPARISON:  07/25/2016 FINDINGS: CT HEAD FINDINGS Brain: Mild atrophic and chronic white matter ischemic changes are again noted. No findings to suggest acute hemorrhage, acute infarction or space-occupying mass lesion are noted. Vascular: No hyperdense vessel or unexpected calcification. Skull: Normal. Negative for fracture or focal lesion. Sinuses/Orbits: No acute finding. Other: None. CT CERVICAL SPINE FINDINGS Alignment: Within normal limits. Skull base and vertebrae: 7 cervical segments are well visualized. Vertebral body height is well maintained. Mild osteophytic changes and facet hypertrophic changes are seen. No acute fracture or acute facet abnormality is noted. Soft tissues and spinal canal: Within normal limits for the patient's age. Disc levels: Mild disc space narrowing is noted at C7-T1. No significant disc pathology is noted. Upper chest: Within normal limits. IMPRESSION: CT of  the head: Chronic atrophic and ischemic changes stable from the prior exam. CT of the cervical spine: Multilevel degenerative change without acute abnormality. Electronically Signed   By: Inez Catalina M.D.   On: 09/10/2016 07:36   ELECTROENCEPHALOGRAM REPORT Date of Study: 09/11/2016 Impression: This awake and drowsy EEG is normal.    CBC Latest Ref Rng & Units 09/11/2016 09/10/2016 09/10/2016  WBC 4.0 - 10.5 K/uL 4.1 - 7.1  Hemoglobin 13.0 - 17.0 g/dL 10.4(L) 11.6(L) 11.5(L)  Hematocrit 39.0 - 52.0 % 30.4(L) 34.0(L) 33.9(L)  Platelets 150 - 400 K/uL 110(L) - 125(L)   CMP Latest Ref Rng & Units 09/12/2016 09/11/2016 09/10/2016  Glucose 65 - 99 mg/dL 161(H) 136(H) 274(H)  BUN 6 - 20 mg/dL 22(H) 23(H) 22(H)  Creatinine 0.61 - 1.24 mg/dL 1.89(H) 1.98(H) 2.00(H)  Sodium 135 - 145 mmol/L 141 140 140  Potassium 3.5 - 5.1 mmol/L 3.3(L) 3.5 3.2(L)  Chloride 101 - 111 mmol/L 107 106 100(L)  CO2 22 - 32 mmol/L 27 28 -  Calcium 8.9 - 10.3 mg/dL 9.0 8.8(L) -  Total Protein 6.5 - 8.1 g/dL - 6.0(L) -  Total Bilirubin 0.3 - 1.2 mg/dL - 0.7 -  Alkaline Phos 38 - 126 U/L - 48 -  AST 15 - 41 U/L - 16 -  ALT 17 - 63 U/L - 10(L) -   Urinalysis    Component Value Date/Time   COLORURINE YELLOW 09/11/2016 [place] 09/11/2016 2312   LABSPEC 1.017 09/11/2016 2312   PHURINE 5.0 09/11/2016 2312   GLUCOSEU 50 (A) 09/11/2016 2312   HGBUR NEGATIVE 09/11/2016 2312   BILIRUBINUR NEGATIVE 09/11/2016 2312   KETONESUR NEGATIVE 09/11/2016 2312   PROTEINUR >=300 (A) 09/11/2016 2312   UROBILINOGEN 1.0 11/02/2014 2301   NITRITE NEGATIVE 09/11/2016 2312   LEUKOCYTESUR NEGATIVE 09/11/2016 2312   Drugs of Abuse     Component Value Date/Time   LABOPIA NONE DETECTED 09/10/2016 1415   COCAINSCRNUR NONE DETECTED 09/10/2016 1415   LABBENZ NONE DETECTED 09/10/2016 1415   AMPHETMU NONE DETECTED 09/10/2016 1415   THCU NONE DETECTED 09/10/2016 1415   LABBARB NONE DETECTED 09/10/2016 1415     Results for  KALUB, MORILLO (MRN [id number]) as of 09/12/2016 15:49  Ref. Range 09/11/2016 09:59  CK Total Latest Ref Range: 49 - 397 U/L 96   Results for OLIVERIO, CHO (MRN [id number]) as of 09/12/2016 15:49  Ref. Range 09/10/2016 14:20  TSH Latest Ref Range: 0.350 - 4.500 uIU/mL 0.708   Results for Buckalew, Irvan  Viona Gilmore (MRN [id number]) as of 09/12/2016 15:49  Ref. Range 09/10/2016 15:30  Hemoglobin A1C Latest Ref Range: 4.8 - 5.6 % 7.1 (H)   Results for JESHUA, RANSFORD (MRN [id number]) as of 09/12/2016 15:49  Ref. Range 09/10/2016 18:20  HCV Ab Latest Ref Range: 0.0 - 0.9 s/co ratio <0.1  HIV Latest Ref Range: Non Reactive  Non Reactive   Results for BRAXDEN, LOVERING (MRN [id number]) as of 09/12/2016 15:49  Ref. Range 09/10/2016 18:20  HCV Ab Latest Ref Range: 0.0 - 0.9 s/co ratio <0.1  HIV Latest Ref Range: Non Reactive  Non Reactive   Results for BRIGHT, SPIELMANN (MRN [id number]) as of 09/12/2016 15:49  Ref. Range 09/10/2016 12:42 09/10/2016 18:20 09/11/2016 00:27  Troponin I Latest Ref Range: <0.03 ng/mL 0.03 (HH) 0.03 (HH) 0.03 (HH)    Discharge Instructions: Discharge Instructions    Call MD for:  difficulty breathing, headache or visual disturbances    Complete by:  As directed    Call MD for:  persistant dizziness or light-headedness    Complete by:  As directed    Diet - low sodium heart healthy    Complete by:  As directed    Discharge instructions    Complete by:  As directed    Please work with physical therapy and home health nurse to improve your strength, balance, and to take your medications regularly. It is also very important for you to use your CPAP machine at night to get a good nights rest. If the machine mask does not fit well or causes breathing problems [facility] so that you can tolerate it.   Please schedule a follow up appointment with your primary care doctor at the [facility] within 2 weeks to follow up your hospital stay and assess for symptoms or  medication issues. Please make sure to follow up with the GI doctors in the near future to see if you need another coloscopy.   If you desire to follow up with our internal medicine clinic at this hospital call [phone] to schedule an appointment.   Increase activity slowly    Complete by:  As directed       Signed: Asencion Partridge, MD 09/12/2016, 4:31 PM   Pager: [phone]

## 2016-09-12 NOTE — Progress Notes (Signed)
Subjective: No complaints today. Ate breakfast and slept well overnight. Asks [place] to call and talk to his wife who has questions. Glad to hear he is stable for discharge today.    Objective: Vital signs in last 24 hours: Vitals:   09/11/16 1616 09/11/16 2118 09/12/16 0129 09/12/16 0606  BP:  (!) 163/79 (!) 174/87 (!) 166/66  Pulse:  66 65 62  Resp:  16 16 16   Temp:  98.9 F (37.2 C) 99 F (37.2 C) 98.6 F (37 C)  TempSrc:  Oral Oral Oral  SpO2:  100% 100% 96%  Weight:    209 lb 1.6 oz (94.8 kg)  Height: 5\' 10"  (1.778 m)      No intake or output data in the 24 hours ending 09/12/16 0919  Physical Exam General appearance: Elderly gentleman sitting up eating breakfast, in no acute distress, conversational HENT: Normocephalic, moist mucous membranes, tongue tremor noted Cardiovascular: Regular rate and rhythm, soft systolic murmur, rubs, gallops Respiratory/Chest: Clear to auscultation bilaterally, normal work of breathing Abdomen: Soft, non-tender, non-distended Skin: Warm, dry, intact Neuro: Cranial nerves grossly intact, alert and oriented, strength intact throughout Psych: Appropriate affect  Labs / Imaging / Procedures: CBC Latest Ref Rng & Units 09/11/2016 09/10/2016 09/10/2016  WBC 4.0 - 10.5 K/uL 4.1 - 7.1  Hemoglobin 13.0 - 17.0 g/dL 10.4(L) 11.6(L) 11.5(L)  Hematocrit 39.0 - 52.0 % 30.4(L) 34.0(L) 33.9(L)  Platelets 150 - 400 K/uL 110(L) - 125(L)   BMP Latest Ref Rng & Units 09/12/2016 09/11/2016 09/10/2016  Glucose 65 - 99 mg/dL 161(H) 136(H) 274(H)  BUN 6 - 20 mg/dL 22(H) 23(H) 22(H)  Creatinine 0.61 - 1.24 mg/dL 1.89(H) 1.98(H) 2.00(H)  Sodium 135 - 145 mmol/L 141 140 140  Potassium 3.5 - 5.1 mmol/L 3.3(L) 3.5 3.2(L)  Chloride 101 - 111 mmol/L 107 106 100(L)  CO2 22 - 32 mmol/L 27 28 -  Calcium 8.9 - 10.3 mg/dL 9.0 8.8(L) -   No results found.  Assessment/Plan: Cody Silva is a 71 y.o. man with PMH CVA, TIA, insulin-dependent T2DM, HTN, HLD, gout, CKD3B, PTSD,  OSA, glaucoma is admitted for syncope workup.  Generalized fatigue, vs syncopal episode has had several admissions for similar presyncopal episodes and workup has been negative so far. TTE 12/2015, carotid US 09/2015 normal, loop recorder since 12/2015 with no document arrhythmias. He has a history of stroke in 2014 with mild residual R deficits, had a TIA in 09/2015, and has chronic microvascular changes visible on imaging. 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 Denies any palpitations, seizures, incontinence. Neuro exam intact aside from loss of vision in left eye due to previous surgery, sustained no injuries and no bruises on exam. Found asleep leaning again cabinet in kitchen - not consistent with syncope/fall from standing. Poorly managing his medications at home, also has OSA and is noncompliant with CPAP and falling asleep during conversation today. Cardiac etiology seems unlikely, most likely poor medication management vs excess sleepiness in setting of CPAP noncompliance.  - Orthostatics positive for diastolic drop >19 mmHg within 3 minutes standing, unclear if symptomatic, will recommend behavioral / compensation strategies - EEG normal - CPAP QHS - Gradually restarting home medications - HH PT RN ordered  Unintentional weight loss, 206 lbs down from 232 in Oct 2017. Endorses fatigue and poor po intake due to loss of appetite. States the he has lost "3 inches off his waist-line." Denies hematochezia or melena. Has history of lower GIB in 2016 following  polypectomies that resolved and Plavix was safely restarted. - UA unremarkable - Nutrition consult appreciated - Will need to follow up with GI outpatient  T2DM, insulin-dependent, last Hb A1c 7.9 in 12/2015. 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 History of agent orange exposure puts him at risk for developing MDS - Trend CBC   History of CVA - continue Plavix Glaucoma - continue home eye drops Hyperlipidemia - Lipitor  Functional status, questionably independent in ADLs, dependent in IADLs at home  - PT OT eval and treat - HH PT RN ordered  Dispo: Anticipated discharge in 0-1 days.  LOS: 1 day   Asencion Partridge, MD 09/12/2016, 9:19 AM Pager: [phone]

## 2016-09-19 ENCOUNTER — Ambulatory Visit (INDEPENDENT_AMBULATORY_CARE_PROVIDER_SITE_OTHER): Payer: Medicare Other | Admitting: *Deleted

## 2016-09-19 DIAGNOSIS — Z95818 Presence of other cardiac implants and grafts: Secondary | ICD-10-CM | POA: Diagnosis not present

## 2016-09-19 NOTE — Progress Notes (Signed)
Carelink Summary Report / Loop Recorder 

## 2016-09-27 ENCOUNTER — Emergency Department (HOSPITAL_COMMUNITY)
Admission: EM | Admit: 2016-09-27 | Discharge: 2016-09-27 | Disposition: A | Discharge status: Home / Self Care | Payer: Medicare Other | Attending: Emergency Medicine | Admitting: Emergency Medicine

## 2016-09-27 ENCOUNTER — Encounter (HOSPITAL_COMMUNITY): Payer: Self-pay

## 2016-09-27 DIAGNOSIS — I129 Hypertensive chronic kidney disease with stage 1 through stage 4 chronic kidney disease, or unspecified chronic kidney disease: Secondary | ICD-10-CM | POA: Diagnosis not present

## 2016-09-27 DIAGNOSIS — I951 Orthostatic hypotension: Secondary | ICD-10-CM | POA: Insufficient documentation

## 2016-09-27 DIAGNOSIS — R55 Syncope and collapse: Secondary | ICD-10-CM | POA: Diagnosis present

## 2016-09-27 DIAGNOSIS — E1122 Type 2 diabetes mellitus with diabetic chronic kidney disease: Secondary | ICD-10-CM | POA: Insufficient documentation

## 2016-09-27 DIAGNOSIS — Z794 Long term (current) use of insulin: Secondary | ICD-10-CM | POA: Diagnosis not present

## 2016-09-27 DIAGNOSIS — E11319 Type 2 diabetes mellitus with unspecified diabetic retinopathy without macular edema: Secondary | ICD-10-CM | POA: Diagnosis not present

## 2016-09-27 DIAGNOSIS — Z8673 Personal history of transient ischemic attack (TIA), and cerebral infarction without residual deficits: Secondary | ICD-10-CM | POA: Diagnosis not present

## 2016-09-27 DIAGNOSIS — N183 Chronic kidney disease, stage 3 (moderate): Secondary | ICD-10-CM | POA: Insufficient documentation

## 2016-09-27 DIAGNOSIS — E1140 Type 2 diabetes mellitus with diabetic neuropathy, unspecified: Secondary | ICD-10-CM | POA: Diagnosis not present

## 2016-09-27 LAB — BASIC METABOLIC PANEL
Anion gap: 7 (ref 5–15)
BUN: 20 mg/dL (ref 6–20)
CALCIUM: 9.2 mg/dL (ref 8.9–10.3)
CO2: 28 mmol/L (ref 22–32)
CREATININE: 2.02 mg/dL — AB (ref 0.61–1.24)
Chloride: 102 mmol/L (ref 101–111)
GFR calc Af Amer: 37 mL/min — ABNORMAL LOW (ref >60)
GFR, EST NON AFRICAN AMERICAN: 32 mL/min — AB (ref >60)
GLUCOSE: 243 mg/dL — AB (ref 65–99)
POTASSIUM: 3.4 mmol/L — AB (ref 3.5–5.1)
SODIUM: 137 mmol/L (ref 135–145)

## 2016-09-27 LAB — CBC WITH DIFFERENTIAL/PLATELET
Basophils Absolute: 0 10*3/uL (ref 0.0–0.1)
Basophils Relative: 1 %
EOS ABS: 0.1 10*3/uL (ref 0.0–0.7)
EOS PCT: 3 %
HCT: 33 % — ABNORMAL LOW (ref 39.0–52.0)
Hemoglobin: 11.2 g/dL — ABNORMAL LOW (ref 13.0–17.0)
LYMPHS ABS: 1.1 10*3/uL (ref 0.7–4.0)
LYMPHS PCT: 34 %
MCH: 29.6 pg (ref 26.0–34.0)
MCHC: 33.9 g/dL (ref 30.0–36.0)
MCV: 87.1 fL (ref 78.0–100.0)
MONO ABS: 0.3 10*3/uL (ref 0.1–1.0)
MONOS PCT: 10 %
Neutro Abs: 1.7 10*3/uL (ref 1.7–7.7)
Neutrophils Relative %: 52 %
Platelets: 158 10*3/uL (ref 150–400)
RBC: 3.79 MIL/uL — ABNORMAL LOW (ref 4.22–5.81)
RDW: 13 % (ref 11.5–15.5)
WBC: 3.3 10*3/uL — AB (ref 4.0–10.5)

## 2016-09-27 LAB — CBG MONITORING, ED: Glucose-Capillary: 234 mg/dL — ABNORMAL HIGH (ref 65–99)

## 2016-09-27 LAB — MAGNESIUM: MAGNESIUM: 1.8 mg/dL (ref 1.7–2.4)

## 2016-09-27 MED ORDER — LEVETIRACETAM 500 MG PO TABS: 500.0000 mg | ORAL_TABLET | Freq: Two times a day (BID) | ORAL | 0 refills | Status: DC

## 2016-09-27 NOTE — ED Triage Notes (Signed)
Pt asrrives [facility] with c/o syncopal episode while sitting in chair at home. Witnessed by home health nurse who told EMS it last 8 minutes where pt was not responsive. Hx of similar episodes Monday. Hx of stroke 2014.

## 2016-09-27 NOTE — ED Provider Notes (Signed)
[facility] DEPT Provider Note   CSN: [id number] Arrival date & time: 09/27/16  1626     History   Chief Complaint Chief Complaint  Patient presents with  . Loss of Consciousness    HPI Cody Silva is a 71 y.o. male.  HPI 72 year old male with a history of CVA with right-sided deficits, hypertension, diabetes presenting after a episode of unresponsiveness. Per the wife who is present during the episode she states that the home health nurse was there and had taken his blood pressure and it was in the 867Y systolic. She states that he then was talking on the phone with somebody and all the sudden stops talking and had a glazed look in his eyes and she thinks that he was looking to the left. He was unresponsive to sternal rub and she noted that his right arm was contracting and flexing. He was unresponsive for approximately 8 minutes. EMS was called and when they arrived he became alert however did not talk. He only nodded his head but would not answer questions. He states that he is now baseline and per his wife he appears to be at baseline. He denies any numbness or worsening weakness. He denies any headache, chest pain, shortness of breath, abdominal pain, nausea, vomiting, diarrhea.  Past Medical History:  Diagnosis Date  . Anemia   . Arthritis    "right arm, right ankle, right side" (10/30/2014)  . Colon polyps   . Diabetic neuropathy ([place])   . Diabetic retinopathy ([place])   . H/O agent Orange exposure   . Headache    "@ least 3 times/wk" (10/30/2014)  . History of blood transfusion 10/30/2014   hematochezia  . History of gout   . Hypertension   . OSA on CPAP    "suppose to wear mask; I've got a call in for an equipment change" (10/30/2014)  . PTSD (post-traumatic stress disorder)    "service related"  . Stroke [facility]) 2014   left extremity deficits; facial left  . Type II diabetes mellitus [facility])     Patient Active Problem List   Diagnosis Date Noted  . OSA  (obstructive sleep apnea) 09/12/2016  . Weight loss 09/12/2016  . Malnutrition of moderate degree 09/11/2016  . Syncope and collapse 09/10/2016  . H/O agent Orange exposure 12/24/2015  . Type 2 diabetes with nephropathy ([place])   . Near syncope 12/22/2015  . History of TIAs 09/14/2015  . CKD stage 3 due to type 2 diabetes mellitus ([place]) 09/14/2015  . Acute lower GI bleeding 10/30/2014  . History of colonic polyps 10/30/2014  . Depression 10/30/2014  . Symptomatic anemia 10/30/2014  . AKI (acute kidney injury) ([place]) 10/30/2014  . Ataxic gait 09/21/2014  . History of CVA (cerebrovascular accident) 02/22/2013  . Abnormal brain scan 02/21/2013  . Dizziness 02/21/2013  . Weakness 02/21/2013  . Diabetes mellitus ([place]) 02/21/2013  . Hypertension 02/21/2013    Past Surgical History:  Procedure Laterality Date  . BONE GRAFT HIP ILIAC CREST Left ~ 1976  . CATARACT EXTRACTION W/ INTRAOCULAR LENS  IMPLANT, BILATERAL Bilateral 2014-2015  . EP IMPLANTABLE DEVICE N/A 12/24/2015   Procedure: Loop Recorder Insertion;  Surgeon: Thompson Grayer, MD;  Location: [facility];  Service: Cardiovascular;  Laterality: N/A;  . TUMOR REMOVAL Right ~ 1976   "arm; had to take bone left hip to add to the repair"       Home Medications    Prior to Admission medications   Medication Sig  Start Date End Date Taking? Authorizing Provider  acetaminophen (TYLENOL) 500 MG tablet Take 1 tablet (500 mg total) by mouth every 6 (six) hours as needed for mild pain, moderate pain, fever or headache. 11/01/14  Yes Marjan Rabbani, MD  albuterol (PROVENTIL HFA;VENTOLIN HFA) 108 (90 BASE) MCG/ACT inhaler Inhale 2 puffs into the lungs every 6 (six) hours as needed for wheezing.   Yes Historical Provider, MD  Amino Acids-Protein Hydrolys (FEEDING SUPPLEMENT, PRO-STAT SUGAR FREE 64,) LIQD Take 30 mLs by mouth 2 (two) times daily. 09/12/16  Yes Asencion Partridge, MD  amLODipine (NORVASC) 10 MG tablet Take 10 mg by mouth daily.   Yes  Historical Provider, MD  atorvastatin (LIPITOR) 20 MG tablet Take 1 tablet (20 mg total) by mouth daily at 6 PM. 09/12/16  Yes Asencion Partridge, MD  carvedilol (COREG) 25 MG tablet Take 50 mg by mouth 2 (two) times daily. Reported on 09/14/2015   Yes Historical Provider, MD  cholecalciferol 2000 units TABS Take 1 tablet (2,000 Units total) by mouth daily. 09/12/16  Yes Asencion Partridge, MD  citalopram (CELEXA) 20 MG tablet Take 20 mg by mouth daily.   Yes Historical Provider, MD  clopidogrel (PLAVIX) 75 MG tablet Take 1 tablet (75 mg total) by mouth at bedtime. 11/08/14  Yes Juluis Mire, MD  feeding supplement, ENSURE ENLIVE, (ENSURE ENLIVE) LIQD Take 237 mLs by mouth 2 (two) times daily after a meal. 09/12/16  Yes Asencion Partridge, MD  fluocinonide cream (LIDEX) 2.35 % Apply 1 application topically daily.    Yes Historical Provider, MD  fluticasone (FLONASE) 50 MCG/ACT nasal spray Place 2 sprays into the nose daily as needed for allergies.    Yes Historical Provider, MD  gabapentin (NEURONTIN) 300 MG capsule Take 300 mg by mouth daily as needed (leg pain).    Yes Historical Provider, MD  Ginkgo Biloba (GNP GINGKO BILOBA EXTRACT PO) Take 1 capsule by mouth daily.   Yes Historical Provider, MD  insulin aspart (NOVOLOG) 100 UNIT/ML injection Inject 5-15 Units into the skin 3 (three) times daily with meals. Per sliding scale   Yes Historical Provider, MD  insulin glargine (LANTUS) 100 UNIT/ML injection Inject 0-30 Units into the skin at bedtime.    Yes Historical Provider, MD  latanoprost (XALATAN) 0.005 % ophthalmic solution Place 1 drop into both eyes at bedtime.    Yes Historical Provider, MD  loratadine (CLARITIN) 10 MG tablet Take 10 mg by mouth daily as needed for allergies.   Yes Historical Provider, MD  losartan (COZAAR) 100 MG tablet Take 100 mg by mouth daily.   Yes Historical Provider, MD  Multiple Vitamins-Minerals (MULTIVITAMIN PO) Take 1 tablet by mouth daily.   Yes Historical Provider, MD  polyvinyl  alcohol (LIQUIFILM TEARS) 1.4 % ophthalmic solution Place 1 drop into both eyes 5 (five) times daily as needed (for dry eyes).   Yes Historical Provider, MD  Skin Protectants, Misc. (EUCERIN) cream Apply 1 application topically daily.    Yes Historical Provider, MD  spironolactone (ALDACTONE) 25 MG tablet Take 25 mg by mouth daily.   Yes Historical Provider, MD  UNKNOWN TO PATIENT Place 1 drop into both eyes every morning. Other eye drop, unknown name  (received from [place]    Yes Historical Provider, MD    Family History Family History  Problem Relation Age of Onset  . Diabetes Mother   . Breast cancer Mother   . Heart attack Mother     CABG - Age 31  . Stroke  Brother   . Lung disease Father   . Neurofibromatosis Maternal Uncle     Social History Social History  Substance Use Topics  . Smoking status: Never Smoker  . Smokeless tobacco: Never Used  . Alcohol use 0.6 oz/week    1 Cans of beer per week     Comment: 10/30/2014 "might have a beer a couple times/yr"     Allergies   Metformin and related   Review of Systems Review of Systems  Constitutional: Negative for chills and fever.  HENT: Negative for ear pain and sore throat.   Eyes: Negative for pain and visual disturbance.  Respiratory: Negative for cough and shortness of breath.   Cardiovascular: Negative for chest pain and palpitations.  Gastrointestinal: Negative for abdominal pain and vomiting.  Genitourinary: Negative for dysuria and hematuria.  Musculoskeletal: Negative for arthralgias and back pain.  Skin: Negative for color change and rash.  Neurological: Positive for syncope and weakness (chronic). Negative for numbness and headaches.  All other systems reviewed and are negative.    Physical Exam Updated Vital Signs BP 183/93   Pulse 63   Temp 98.1 F (36.7 C) (Oral)   Resp 13   Ht 6\' 4"  (1.93 m)   Wt 97.5 kg   SpO2 100%   BMI 26.17 kg/m   Physical Exam  Constitutional: He is oriented to person,  place, and time. He appears well-developed and well-nourished.  HENT:  Head: Normocephalic and atraumatic.  Eyes: Conjunctivae and EOM are normal. Pupils are equal, round, and reactive to light.  Neck: Trachea normal, normal range of motion, full passive range of motion without pain and phonation normal. Neck supple.  Cardiovascular: Normal rate, regular rhythm, S1 normal, S2 normal, normal heart sounds, intact distal pulses and normal pulses.   No murmur heard. Pulmonary/Chest: Effort normal and breath sounds normal. No respiratory distress. He has no decreased breath sounds. He has no rhonchi.  Abdominal: Soft. He exhibits no distension. There is no tenderness.  Musculoskeletal: He exhibits no edema.  Neurological: He is alert and oriented to person, place, and time. No cranial nerve deficit or sensory deficit. He displays a negative Romberg sign. Coordination normal.  5/5 strength in LUE and LLE. 4+/5 in RUE and RLE.  Skin: Skin is warm and dry. Capillary refill takes less than 2 seconds.  Psychiatric: He has a normal mood and affect.  Nursing note and vitals reviewed.    ED Treatments / Results  Labs (all labs ordered are listed, but only abnormal results are displayed) Labs Reviewed  CBC WITH DIFFERENTIAL/PLATELET - Abnormal; Notable for the following:       Result Value   WBC 3.3 (*)    RBC 3.79 (*)    Hemoglobin 11.2 (*)    HCT 33.0 (*)    All other components within normal limits  BASIC METABOLIC PANEL - Abnormal; Notable for the following:    Potassium 3.4 (*)    Glucose, Bld 243 (*)    Creatinine, Ser 2.02 (*)    GFR calc non Af Amer 32 (*)    GFR calc Af Amer 37 (*)    All other components within normal limits  CBG MONITORING, ED - Abnormal; Notable for the following:    Glucose-Capillary 234 (*)    All other components within normal limits  MAGNESIUM    EKG  EKG Interpretation  Date/Time:  Wednesday September 27 2016 16:30:44 EST Ventricular Rate:  66 PR  Interval:    QRS  Duration: 117 QT Interval:  455 QTC Calculation: 477 R Axis:   27 Text Interpretation:  Sinus rhythm Probable left atrial enlargement Nonspecific intraventricular conduction delay Nonspecific T abnormalities, lateral leads No acute changes No significant change since last tracing Confirmed by Kathrynn Humble, MD, Thelma Comp [phone]) on 09/27/2016 6:24:12 PM       Radiology No results found.  Procedures Procedures (including critical care time)  Medications Ordered in ED Medications - No data to display   Initial Impression / Assessment and Plan / ED Course  I have reviewed the triage vital signs and the nursing notes.  Pertinent labs & imaging results that were available during my care of the patient were reviewed by me and considered in my medical decision making (see chart for details).    71 year old male presenting after an episode of unresponsiveness as described above. Per his wife who is at bedside she states that he is at his baseline. His labs are grossly unremarkable aside from hyperglycemia and a mildly elevated creatinine that appears to be at his baseline. EKG with normal sinus rhythm and no significant changes from prior. No evidence of long QT, Wolff-Parkinson-White, Brugada, other arrhythmias.  He has recently been admitted and had a full syncope workup was unremarkable as well as an EEG that was normal. The description of this episode sounds more consistent with a seizure. I spoke with neurology about the episode and they agree that it sounds seizure-like. Even though his EEG during the last admission was normal they suggests starting him on Keppra 500 twice a day for 2 days and then increasing to 1000 twice a day and following up with neurology outpatient.  On re-evaluation of the patient, the wife states that this has been happening more when he stands up. ED tech performed orthostatics and were consistent with orthostatic hypotension. More clarity was obtained  about the syncopal episode at the [place] today and after he syncopated, the doctor lifted his legs above his head and he became responsive again. 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 Will refer to neurology for management of ortho static hypotension.  Patient care discussed and supervised by my attending, Dr. Kathrynn Humble. Drucie Ip, MD   Final Clinical Impressions(s) / ED Diagnoses   Final diagnoses:  Orthostatic hypotension    New Prescriptions Discharge Medication List as of 09/27/2016  9:05 PM       Narissa Beaufort [place] Veron Senner, MD 09/27/16 [place], MD 09/30/16 [phone]

## 2016-09-27 NOTE — Discharge Instructions (Signed)
Please call either [facility] or [facility] for follow up appointment for orthostatic hypotension.

## 2016-10-03 ENCOUNTER — Encounter: Payer: Self-pay | Admitting: Neurology

## 2016-10-03 ENCOUNTER — Ambulatory Visit (INDEPENDENT_AMBULATORY_CARE_PROVIDER_SITE_OTHER): Payer: Medicare Other | Admitting: Neurology

## 2016-10-03 VITALS — BP 174/94 | HR 70 | Wt 203.0 lb

## 2016-10-03 DIAGNOSIS — G450 Vertebro-basilar artery syndrome: Secondary | ICD-10-CM

## 2016-10-03 DIAGNOSIS — G40A09 Absence epileptic syndrome, not intractable, without status epilepticus: Secondary | ICD-10-CM

## 2016-10-03 MED ORDER — DIVALPROEX SODIUM ER 500 MG PO TB24: 500.0000 mg | ORAL_TABLET | Freq: Every day | ORAL | 3 refills | Status: DC

## 2016-10-03 NOTE — Progress Notes (Signed)
[facility]  PATIENT: Cody Silva DOB: Jan 06, 1946   REASON FOR VISIT: Follow-up for CVA HISTORY FROM: Patient    HISTORY OF PRESENT ILLNESS:71 year male seen for first office f/u visit after [facility] admission on 02/20/2013.he presented with 3 month h/o intermittent slurred speech, leg weakness and 1 episode of syncope.he was evaluated at the [facility] clinic and had several head CTs which were unremarkable.he had 2 episodes of falling on day of admission and MRI brain showed a large 4 x 4 cm diffusion positive lesion in corpus callosum involving right more than left cingulate guyrus with only faint enhancement. It was unclear wether this was a callosal branch anterior cerebral artery infarct or a mass lesion.MRA showed decrease flow in median artery of corpus callosum.Carotid dopplers and 2DEcho showed normal ejection fraction. EEG was normal. Lipid profile was normal. HbA1c was elevated at 7.5% He is tolerating plavix well without bleeding or side effects.He states his gait and balance have improved and he is walking well. He notices some imbalance when he is in a hurry or tired but is able to catch himself without falling. He has sleep apnea but is not using his CPAP mask daily.  UPDATE 09/12/13 (LL): Cody Silva returns to office for stroke revisit. He states he has been doing well, having some headaches but not too often. He complains that his memory is not as well as before the stroke. His wife does not notice any problem with his memory. His follow up MRI showed expected evolutionary changes, no mention of mass lesion. He is tolerating Plavix well but has stopped currently to have a tooth extracted early next week. His BP is elevated in office today, at 166/92, but he says it is usually lower.   UPDATE 03/12/14 (LL): Since last visit, he has not had any new neurovascular symptoms. He has started to have more difficulties with imbalance. 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 His blood pressure is elevated in the office today, 188/90.  He states it is not that high when he takes it at home. He is having progressively more acting out dreams, but his wife states that he does not get out of bed.  He has PTSD from being in the [place] War, and has had treatment through the [place]. He does not wear his cpap consistently. His blood sugars in the morning range from 130-170, but he has had some problems with lows as well.  He is tolerating Plavix well with no signs of significant bleeding or bruising. Update 1/18/2016PS : He returns for follow-up after last visit 6 months ago. She continues to have poor balance. He did go for outpatient physical therapy following the last visit but it did not seem to help a lot. The patient's wife states that he had a transient episode of facial droop as well as increased imbalance about a month ago but they did not seek medical help at that time. The patient continues not to use his CPAP regularly but he does plan to go to the [place] and get his recalibrated so it fits better. He remains on Plavix which is tolerating well but plans to get an elective dental procedure and wants to stop it for 3 days prior to the procedure. He states his fasting sugars have been quite good and last hemoglobin A1c was 7.0  3 months ago. His blood pressure has been well controlled. His last lipid profile was also fine.  He has not had a carotid Doppler done since he left the hospital a year and a half ago.  Update 03/22/2015 PS: He returns for follow-up after last visit 6 months ago. He was admitted in February 2016 twice to the hospital initially with episodes of GI bleed and subsequently with anemia and generalized weakness. He see blood transfusion. He'll stop Plavix for 7 days but GI workup did not reveal a source of bleeding. Plavix has been restarted and is tolerating it well. He however does still complain of generalized weakness and at times feels he is weak  and cannot get out of a chair and has trouble climbing steps. He has not had any recent lab work done but he he plans to see his primary care physician at the [place] but is not happy with the care and plans for Transfer to a new physician in [place]. He still continues to have occasional speech difficulties and continues to have mild balance difficulties though he is not had any falls. Update 4/12/2017PS : He returns for follow-up after last visit 9 months ago. He was hospitalized briefly in January 2017 with transient episode of slurred speech, facial droop and blurred vision. MRI scan of the brain personally reviewed did not reveal an acute infarct. MRA brain showed no large vessel stenosis or occlusion. Carotid ultrasound was unremarkable. Transthoracic echo showed normal ejection fraction. Hemoglobin A1c was elevated at 7.9 and LDL cholesterol 137. Patient was started on Plavix and continued on his blood pressure and cholesterol medications. He states is tolerating all medications well without any side effects. His blood pressure is not doing well and  today is elevated at 148/82. He has recently changed his primary care physician and plans to work with him towards getting better controlled. His fasting sugars have been all okay but his last hemoglobin A1c in January was elevated at 7.9. Patient states he has significant difficulty with walking particularly steps he feels off balance. When he bends down suddenly or turns his neck he feels off balance. His noticed weakness in his hand muscles as well as wasting and often drops objects from his hands. and he has seen in spine surgeon at [facility] and plans treatment of her neck surgery but wants to wait till June to have this done. He also plans on having some tooth removed and isn't willing to wait till June as well. At times he has had the malls while climbing steps. 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 He  wants to be referred for outpatient occupational therapy to work on his hand strength UPDATE 06/15/16 CM Mr. Hissong, 71 year old male returns for follow-up. He has a history of CVA in 2014. Patient is currently on Plavix without further stroke or TIA symptoms since last seen blood pressure was elevated in the office today however he states he is taking his medications. He continues to have some balance problems and difficulty going up and down steps. He is using a single-point cane to ambulate which makes him safer. If he makes sudden movements he feels off balance. He is still planning on having cervical spine surgery andhe needs to be off Plavix days prior to the procedure. Most recent hemoglobin A1c 6.7. He gets most of his medical care through the [place] in [place]. He returns for reevaluation Update 10/03/2016 : He returns for follow-up after last visit 3-1/2 months ago. Recommend by his wife. Since November  he said multiple episodes of brief loss of consciousness. He states his does not have any warnings and just passes out and is unresponsive for several minutes. The longest episode he was unresponsive for around 8 minutes. He had one episode the day prior to Thanksgiving when he was at [employer] when he passed out. A week later at home in the kitchen the wife notes an episode when he complained of feeling weak and all of a sudden he fell down. Ambulance was called and was taken to the hospital. He also had an episode last week when he was at the [facility] clinic standing at the counter when he complained of feeling weak and he started sweating and then he passed out. This this time he is out for around 5 minutes. He improved after his legs were elevated. Patient was found to have blood pressure 100/60. Passed out and shortly after he woke up blood pressure was noted as being greater than 200/146. Patient had an EEG done in the hospital on 09/11/16 which was normal. He had several CT scans which were  unremarkable. The patient has a loop recorder in place for syncope and apparently so far no significant cardiac arrhythmia has been found. Patient denies any tonic-clonic activity or tongue bite with episodes. I have reviewed one of his previous admissions for similar episode and [facility] at that time he was noted as having gaze deviation to one side and transient right-sided weakness in the episode at the [facility] but that improved by the time the patient was seen in emergency room at [facility]. Patient does have a prior history of stroke several years ago with infarction of the median artery of the corpus callosum with bilateral parasagittal infarct. At that time initial MRI had suggested stenosis of the basilar artery however subsequent MRA of the brain done in January 2017 had shown only mild basilar artery stenosis. Patient states he is not dehydrated and drinks enough fluids. He denies classical symptoms of orthostatic hypertension. Denies significant cardiac arrhythmias chest pain or shortness of breath during these episodes. He does have an appointment to see Dr. Rayann Heman his cardiologist soon. REVIEW OF SYSTEMS: Full 14 system review of systems performed and notable only for those listed, all others are neg:  Syncope, passing out, weakness, all other systems negative  ALLERGIES: Allergies  Allergen Reactions  . Metformin And Related Diarrhea    HOME MEDICATIONS: Outpatient Medications Prior to Visit  Medication Sig Dispense Refill  . acetaminophen (TYLENOL) 500 MG tablet Take 1 tablet (500 mg total) by mouth every 6 (six) hours as needed for mild pain, moderate pain, fever or headache. 30 tablet 0  . albuterol (PROVENTIL HFA;VENTOLIN HFA) 108 (90 BASE) MCG/ACT inhaler Inhale 2 puffs into the lungs every 6 (six) hours as needed for wheezing.    . Amino Acids-Protein Hydrolys (FEEDING SUPPLEMENT, PRO-STAT SUGAR FREE 64,) LIQD Take 30 mLs by mouth 2 (two) times daily.  900 mL 3  . amLODipine (NORVASC) 10 MG tablet Take 10 mg by mouth daily.    [name] atorvastatin (LIPITOR) 20 MG tablet Take 1 tablet (20 mg total) by mouth daily at 6 PM. 30 tablet 3  . carvedilol (COREG) 25 MG tablet Take 50 mg by mouth 2 (two) times daily. Reported on 09/14/2015    . citalopram (CELEXA) 20 MG tablet Take 20 mg by mouth daily.    . clopidogrel (PLAVIX) 75 MG tablet Take 1 tablet (75 mg total) by  mouth at bedtime.    . feeding supplement, ENSURE ENLIVE, (ENSURE ENLIVE) LIQD Take 237 mLs by mouth 2 (two) times daily after a meal. 237 mL 12  . fluocinonide cream (LIDEX) 3.76 % Apply 1 application topically daily.     . fluticasone (FLONASE) 50 MCG/ACT nasal spray Place 2 sprays into the nose daily as needed for allergies.     [name] gabapentin (NEURONTIN) 300 MG capsule Take 300 mg by mouth daily as needed (leg pain).     . Ginkgo Biloba (GNP GINGKO BILOBA EXTRACT PO) Take 1 capsule by mouth daily.    . insulin aspart (NOVOLOG) 100 UNIT/ML injection Inject 5-15 Units into the skin 3 (three) times daily with meals. Per sliding scale    . insulin glargine (LANTUS) 100 UNIT/ML injection Inject 0-30 Units into the skin at bedtime.     [name] latanoprost (XALATAN) 0.005 % ophthalmic solution Place 1 drop into both eyes at bedtime.     [name] loratadine (CLARITIN) 10 MG tablet Take 10 mg by mouth daily as needed for allergies.    [name] losartan (COZAAR) 100 MG tablet Take 100 mg by mouth daily.    . Multiple Vitamins-Minerals (MULTIVITAMIN PO) Take 1 tablet by mouth daily.    . polyvinyl alcohol (LIQUIFILM TEARS) 1.4 % ophthalmic solution Place 1 drop into both eyes 5 (five) times daily as needed (for dry eyes).    . Skin Protectants, Misc. (EUCERIN) cream Apply 1 application topically daily.     [name] spironolactone (ALDACTONE) 25 MG tablet Take 25 mg by mouth daily.    [name] UNKNOWN TO PATIENT Place 1 drop into both eyes every morning. Other eye drop, unknown name  (received from [place]     . cholecalciferol 2000 units  TABS Take 1 tablet (2,000 Units total) by mouth daily. (Patient not taking: Reported on 10/03/2016) 60 tablet 3   No facility-administered medications prior to visit.     PAST MEDICAL HISTORY: Past Medical History:  Diagnosis Date  . Anemia   . Arthritis    "right arm, right ankle, right side" (10/30/2014)  . Colon polyps   . Diabetic neuropathy ([place])   . Diabetic retinopathy ([place])   . H/O agent Orange exposure   . Headache    "@ least 3 times/wk" (10/30/2014)  . History of blood transfusion 10/30/2014   hematochezia  . History of gout   . Hypertension   . OSA on CPAP    "suppose to wear mask; I've got a call in for an equipment change" (10/30/2014)  . PTSD (post-traumatic stress disorder)    "service related"  . Stroke [facility]) 2014   left extremity deficits; facial left  . Type II diabetes mellitus ([place])     PAST SURGICAL HISTORY: Past Surgical History:  Procedure Laterality Date  . BONE GRAFT HIP ILIAC CREST Left ~ 1976  . CATARACT EXTRACTION W/ INTRAOCULAR LENS  IMPLANT, BILATERAL Bilateral 2014-2015  . EP IMPLANTABLE DEVICE N/A 12/24/2015   Procedure: Loop Recorder Insertion;  Surgeon: Thompson Grayer, MD;  Location: [place];  Service: Cardiovascular;  Laterality: N/A;  . TUMOR REMOVAL Right ~ 1976   "arm; had to take bone left hip to add to the repair"    FAMILY HISTORY: Family History  Problem Relation Age of Onset  . Diabetes Mother   . Breast cancer Mother   . Heart attack Mother     CABG - Age 23  . Stroke Brother   . Lung disease Father   .  Neurofibromatosis Maternal Uncle     SOCIAL HISTORY: Social History   Social History  . Marital status: Married    Spouse name: sheila  . Number of children: 4  . Years of education: 59   Occupational History  . Not on file.   Social History Main Topics  . Smoking status: Never Smoker  . Smokeless tobacco: Never Used  . Alcohol use 0.6 oz/week    1 Cans of beer per week     Comment: 10/30/2014 "might  have a beer a couple times/yr"  . Drug use: No  . Sexual activity: Yes   Other Topics Concern  . Not on file   Social History Narrative   Patient is married with 4 children   Patient is right handed   Patient has a Water quality scientist degree    Patient 4 cups daily   Retired Engineer, structural. Lives with wife, daughter, cousin in [place].   Independent in ADLs and partially dependent for IADLs.   Ambulates with cane sometimes due to intermittent right leg weakness.      PHYSICAL EXAM  Vitals:   10/03/16 1409  BP: (!) 174/94  Pulse: 70  Weight: 203 lb (92.1 kg)   Body mass index is 24.71 kg/m. General: Frail elderly, african american male seated, in no evident distress  Head: head normocephalic and atraumatic.   Neck: supple with no carotid or supraclavicular bruits  Cardiovascular: regular rate and rhythm, no murmurs  Musculoskeletal: no deformity  Skin: no rash/petichiae  Vascular: Normal pulses all extremities    Neurological examination  Mental Status: Awake and fully alert. Oriented to place and time.  Attention span, concentration and fund of knowledge appropriate. Mood and affect appropriate.  Cranial Nerves: Pupils equal, briskly reactive to light. Extraocular movements full without nystagmus. Diminished vision acuity in the left eye from diabetic retinopathy Visual fields diminished in the left eye periphery. Hearing intact. Facial sensation intact. Face, tongue, palate moves normally and symmetrically.  Motor: Normal bulk and tone. Normal strength in all tested extremity muscles.diminished fine finger movements on left and orbits right over left upper extremity.  Sensory.: intact to tough and pinprick and vibratory.  Coordination: Rapid alternating movements normal in all extremities. Finger-to-nose and heel-to-shin performed accurately bilaterally.  Gait and Station: Arises from chair without difficulty. Stance is normal. Gait demonstrates normal stride length, with some  stiffness of the right leg.  Poor arm swing on the left. Unable to heel, toe walk without difficulty. Tandem unsteady. Reflexes: 1+ and symmetric. Toes downgoing.   DIAGNOSTIC DATA (LABS, IMAGING, TESTING) - I reviewed patient records, labs, notes, testing and imaging myself where available.  Lab Results  Component Value Date   WBC 3.3 (L) 09/27/2016   HGB 11.2 (L) 09/27/2016   HCT 33.0 (L) 09/27/2016   MCV 87.1 09/27/2016   PLT 158 09/27/2016      Component Value Date/Time   NA 137 09/27/2016 1635   K 3.4 (L) 09/27/2016 1635   CL 102 09/27/2016 1635   CO2 28 09/27/2016 1635   GLUCOSE 243 (H) 09/27/2016 1635   BUN 20 09/27/2016 1635   CREATININE 2.02 (H) 09/27/2016 1635   CALCIUM 9.2 09/27/2016 1635   PROT 6.0 (L) 09/11/2016 0027   ALBUMIN 2.8 (L) 09/11/2016 0027   AST 16 09/11/2016 0027   ALT 10 (L) 09/11/2016 0027   ALKPHOS 48 09/11/2016 0027   BILITOT 0.7 09/11/2016 0027   GFRNONAA 32 (L) 09/27/2016 1635   GFRAA 37 (L) 09/27/2016  1635   Lab Results  Component Value Date   CHOL 215 (H) 09/15/2015   HDL 58 09/15/2015   LDLCALC 137 (H) 09/15/2015   TRIG 99 09/15/2015   CHOLHDL 3.7 09/15/2015   Lab Results  Component Value Date   HGBA1C 7.1 (H) 09/10/2016   Lab Results  Component Value Date   VITAMINB12 451 09/10/2016   Lab Results  Component Value Date   TSH 0.708 09/10/2016      ASSESSMENT AND PLAN 71 year old AA male with bilateral corpus callosal diffusion positive lesion in June 2014- 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  Admission in January 2017 due to a TIA. Vascular risk factors of HTN, Sleep Apnea and Diabetes.  .    Recent multiple episodes of syncope with brief loss of consciousness  Since November 2017 with some transient focal neurological symptoms unclear as to vertebrobasilar TIAs, or Atonic seizures or cardiogenic syncope.   PLAN: I had a long discussion the patient and his wife regarding his recurrent episodes of  syncope which are of unclear etiology. Cardiology feels these are not related to cardiac disturbance as he has a loop recorder which has been interrogated. Absence of focal neurological symptoms accompanying them makes TIA less likely but and tonic seizures versus vertebrobasilar insufficiency is a possibility which needs evaluation. Recommend check MRI scan of the brain with MRA of the brain and neck. Check EEG. Trial of Depakote ER 500 mg daily for seizure prophylaxis. Have advised the patient to follow-up with his cardiologist Dr. Rayann Heman for further cardiac evaluation for syncope if necessary.Greater than 50% time during this 40 minute visit was spent on counseling and coordination of care about stroke, syncope, discussion of evaluation plan and answering questions Return for follow-up with me in 3 months or call earlier if necessary Antony Contras, MD [facility] [address] [place], [place] [address] [phone]

## 2016-10-03 NOTE — Patient Instructions (Signed)
I had a long discussion the patient and his wife regarding his recurrent episodes of syncope which are of unclear etiology. Cardiology feels these are not related to cardiac disturbance as he has a loop recorder which has been interrogated. Absence of focal neurological symptoms accompanying them makes TIA less likely but and tonic seizures versus vertebrobasilar insufficiency is a possibility which needs evaluation. Recommend check MRI scan of the brain with MRA of the brain and neck. Check EEG. Trial of Depakote ER 500 mg daily for seizure prophylaxis. Have advised the patient to follow-up with his cardiologist Dr. Rayann Heman for further cardiac evaluation for syncope if necessary. Return for follow-up with me in 3 months or call earlier if necessary

## 2016-10-08 ENCOUNTER — Ambulatory Visit
Admission: RE | Admit: 2016-10-08 | Discharge: 2016-10-08 | Disposition: A | Discharge status: Home / Self Care | Payer: Medicare Other | Source: Ambulatory Visit | Attending: Neurology | Admitting: Neurology

## 2016-10-08 DIAGNOSIS — G40A09 Absence epileptic syndrome, not intractable, without status epilepticus: Secondary | ICD-10-CM

## 2016-10-08 DIAGNOSIS — G450 Vertebro-basilar artery syndrome: Secondary | ICD-10-CM

## 2016-10-09 LAB — CUP PACEART REMOTE DEVICE CHECK
Implantable Pulse Generator Implant Date: 20170421
MDC IDC SESS DTM: 20171217153703

## 2016-10-16 NOTE — Progress Notes (Signed)
Cardiology Office Note Date:  10/17/2016  Patient ID:  Cody Silva 11/25/1945, MRN [id number] PCP:  [facility]  Cardiologist:  New at [place], Dr. Rayann Heman    Chief Complaint: ILR visit  History of Present Illness: Cody Silva is a 71 y.o. male with history of recurrent syncope, CVA, HTN, DM,  OSA on CPAP, CRI (III) comes to the office today to be seen for Dr. Rayann Heman.  He was last seen by him at the tine of his ILR implant while in the hospital for CVA, 12/24/15.  He was at [facility] in January after a syncopal event, noted he was found to have malnutrition with 25lb weight loss if 3 mo, orthostatic and concerns for mis-taking his medicines, his clonidine patch was weaned to off, and instructed to see GI in follow up as well, he was again in the ER later in January with recurrent symptoms c/w seizure and started on Keppra with neurology f/u out patient  He ha had a couple of syncopal events, one while he was at the [place] was standing in line talking with the lady there and began staring blankly, she asked if he was OK, his wife came over to lower hiim to a chair, ultimately to the floor and raised his legs finally cam back around.  The last he was seated at his table, his [facility] and PT were at the house, they found his BP 190's/104 and were waiting to see if it would come down in order to do his PT, he was talking on the phone in a seated position when he was again found have a blank stare, was unresponsive and this time somewhat stiff like, he was helped to the floor hs wife said the RN and therapist shook him gave a sternal rub and once EMS arrived his BP to her memory was about 100/50's, there was no clear mention of HR by anyone that she recalls.  He was brought to the hospital.  She states that it was a full 8 minutes that he was unresponsive.  The patient with the events denies symptoms prior to these episodes, he does not feel poorly weak, diaphoretic, no palpitations or  warning.  Though he says he tends to feel like he is tired or sleepy perhaps, not overtly symptomatic otherwise.  He denies any kind of CP, palpitations.    He saw Dr. Leonie Man since his last ER visit, they reported that he had low suspicion of seizure but started him on Depakote and has ordered repeat MRI/MRA and EEG, pending these and his f/u.  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  They can not be more specific.  Device information: MDT ILR, implanted 12/24/15, cryptogenic stroke (also has hx of recurrent syncope) No device observations as of yet  Past Medical History:  Diagnosis Date  . Anemia   . Arthritis    "right arm, right ankle, right side" (10/30/2014)  . Colon polyps   . Diabetic neuropathy ([place])   . Diabetic retinopathy ([place])   . H/O agent Orange exposure   . Headache    "@ least 3 times/wk" (10/30/2014)  . History of blood transfusion 10/30/2014   hematochezia  . History of gout   . Hypertension   . OSA on CPAP    "suppose to wear mask; I've got a call in for an equipment change" (10/30/2014)  . PTSD (post-traumatic stress disorder)    "service  related"  . Stroke [facility]) 2014   left extremity deficits; facial left  . Type II diabetes mellitus ([place])     Past Surgical History:  Procedure Laterality Date  . BONE GRAFT HIP ILIAC CREST Left ~ 1976  . CATARACT EXTRACTION W/ INTRAOCULAR LENS  IMPLANT, BILATERAL Bilateral 2014-2015  . EP IMPLANTABLE DEVICE N/A 12/24/2015   Procedure: Loop Recorder Insertion;  Surgeon: Thompson Grayer, MD;  Location: [place] [facility];  Service: Cardiovascular;  Laterality: N/A;  . TUMOR REMOVAL Right ~ 1976   "arm; had to take bone left hip to add to the repair"    Current Outpatient Prescriptions  Medication Sig Dispense Refill  . acetaminophen (TYLENOL) 500 MG tablet Take 1 tablet (500 mg total) by mouth every 6 (six) hours as needed for mild pain, moderate pain, fever or headache. 30 tablet 0    . albuterol (PROVENTIL HFA;VENTOLIN HFA) 108 (90 BASE) MCG/ACT inhaler Inhale 2 puffs into the lungs every 6 (six) hours as needed for wheezing.    [name] amLODipine (NORVASC) 10 MG tablet Take 10 mg by mouth daily.    [name] atorvastatin (LIPITOR) 20 MG tablet Take 1 tablet (20 mg total) by mouth daily at 6 PM. 30 tablet 3  . carvedilol (COREG) 25 MG tablet Take 50 mg by mouth 2 (two) times daily. Reported on 09/14/2015    . Cholecalciferol (CVS VITAMIN D) 2000 units CAPS TAKE 1 TABLET (2,000 UNITS TOTAL) BY MOUTH DAILY.    . citalopram (CELEXA) 20 MG tablet Take 20 mg by mouth daily.    . clopidogrel (PLAVIX) 75 MG tablet Take 1 tablet (75 mg total) by mouth at bedtime.    . divalproex (DEPAKOTE ER) 500 MG 24 hr tablet Take 1 tablet (500 mg total) by mouth daily. 30 tablet 3  . feeding supplement, ENSURE ENLIVE, (ENSURE ENLIVE) LIQD Take 237 mLs by mouth 2 (two) times daily after a meal. 237 mL 12  . fluocinonide cream (LIDEX) 4.09 % Apply 1 application topically daily.     . fluticasone (FLONASE) 50 MCG/ACT nasal spray Place 2 sprays into the nose daily as needed for allergies.     [name] gabapentin (NEURONTIN) 300 MG capsule Take 300 mg by mouth daily as needed (leg pain).     . Ginkgo Biloba (GNP GINGKO BILOBA EXTRACT PO) Take 1 capsule by mouth daily.    [name] HYDROcodone-acetaminophen (NORCO) 10-325 MG tablet Take 1 tablet by mouth every 6 (six) hours as needed.     . insulin aspart (NOVOLOG) 100 UNIT/ML injection Inject 5-15 Units into the skin 3 (three) times daily with meals. Per sliding scale    . insulin glargine (LANTUS) 100 UNIT/ML injection Inject 0-30 Units into the skin at bedtime.     [name] latanoprost (XALATAN) 0.005 % ophthalmic solution Place 1 drop into both eyes at bedtime.     [name] loratadine (CLARITIN) 10 MG tablet Take 10 mg by mouth daily as needed for allergies.    [name] losartan (COZAAR) 100 MG tablet Take 100 mg by mouth daily.    . Multiple Vitamins-Minerals (MULTIVITAMIN PO) Take 1 tablet by  mouth daily.    . polyvinyl alcohol (LIQUIFILM TEARS) 1.4 % ophthalmic solution Place 1 drop into both eyes 5 (five) times daily as needed (for dry eyes).    . Skin Protectants, Misc. (EUCERIN) cream Apply 1 application topically daily.     [name] spironolactone (ALDACTONE) 25 MG tablet Take 25 mg by mouth daily.    [name]  UNKNOWN TO PATIENT Place 1 drop into both eyes every morning. Other eye drop, unknown name  (received from [place]     . vitamin B-12 (CYANOCOBALAMIN) 1000 MCG tablet Take by mouth.     No current facility-administered medications for this visit.     Allergies:   Metformin and related   Social History:  The patient  reports that he has never smoked. He has never used smokeless tobacco. He reports that he drinks about 0.6 oz of alcohol per week . He reports that he does not use drugs.   Family History:  The patient's family history includes Breast cancer in his mother; Diabetes in his mother; Heart attack in his mother; Lung disease in his father; Neurofibromatosis in his maternal uncle; Stroke in his brother.  ROS:  Please see the history of present illness.    All other systems are reviewed and otherwise negative.   PHYSICAL EXAM:  VS:  BP (!) 180/98   Pulse (!) 58   Ht 6' 4.5" (1.943 m)   Wt 202 lb (91.6 kg)   BMI 24.27 kg/m  BMI: Body mass index is 24.27 kg/m. Well nourished, well developed, in no acute distress  HEENT: normocephalic, atraumatic  Neck: no JVD, carotid bruits or masses Cardiac:  RRR; no significant murmurs, no rubs, or gallops Lungs:  CTA b/l, no wheezing, rhonchi or rales  Abd: soft, nontender MS: no deformity or atrophy Ext: no edema  Skin: warm and dry, no rash Neuro:  No gross deficits appreciated Psych: euthymic mood, full affect  ILR site is stable, no tethering or discomfort   EKG:  Done 09/28/16: SR, 66bpm, PR 131ms, QRS 153ms, QTc 441ms  ILR interrogation done today and reviewed by myself: SR today, good R waves, no observations have been  noted by his ILR, adjusted brady detection to 4 beats and  Tachy to 16 beats to increase device sensitivity.  12/23/15: TTE Study Conclusions - Left ventricle: The cavity size was normal. There was moderate   concentric hypertrophy. Systolic function was normal. The   estimated ejection fraction was in the range of 55% to 60%. Wall   motion was normal; there were no regional wall motion   abnormalities. Doppler parameters are consistent with abnormal   left ventricular relaxation (grade 1 diastolic dysfunction).   Doppler parameters are consistent with elevated ventricular   end-diastolic filling pressure. - Aortic valve: Trileaflet; normal thickness leaflets. There was   mild regurgitation. - Aortic root: The aortic root was normal in size. - Ascending aorta: The ascending aorta was mildly dilated measurinh   41 mm. - Mitral valve: Mildly thickened leaflets . - Left atrium: The atrium was moderately dilated. - Right ventricle: Systolic function was normal. - Right atrium: The atrium was normal in size. - Tricuspid valve: There was trivial regurgitation. - Pulmonic valve: There was no regurgitation. - Pulmonary arteries: Systolic pressure was within the normal   range. - Inferior vena cava: The vessel was normal in size. - Pericardium, extracardiac: There was no pericardial effusion.  Recent Labs: 09/10/2016: TSH 0.708 09/11/2016: ALT 10 09/27/2016: BUN 20; Creatinine, Ser 2.02; Hemoglobin 11.2; Magnesium 1.8; Platelets 158; Potassium 3.4; Sodium 137  No results found for requested labs within last 8760 hours.   Estimated Creatinine Clearance: 43 mL/min (by C-G formula based on SCr of 2.02 mg/dL (H)).   Wt Readings from Last 3 Encounters:  10/17/16 202 lb (91.6 kg)  10/03/16 203 lb (92.1 kg)  09/27/16 215 lb (97.5  kg)     Other studies reviewed: Additional studies/records reviewed today include: summarized above  ASSESSMENT AND PLAN:  1. ILR     no HR, rhythm  observations  2. Recurrent syncope     Suspected to be orthostatic at least historically     He continues to have poor PO intake, both food and water with very poor appetite, easy satiability     His BP is high here today, did not take his AM medicines     Encouraged GI follow up/evaluation     Orthostatic BP today with + change, though not overtly symptomatic and lowest BP remained hypertensive.  Encouraged better PO intake, avoid long periods of standing, support stockings pending to be picked up tomorrow, his [facility] have been addressing his orthostatic issues.  I have encouraged avoiding long periods of standing, and getting up slowly and tips in effort to avoid these events and better PO intake     3. Cryptogenic stroke     No AF on his ILR   4. New onset seizures ?     New neuro w/u is in progress with Dr. Leonie Man  5. HTN     Didn't take his meds today     orthostasis mat be limiting   Disposition: F/u his [facility] MD's, they report they had support stocking fitted/made for him and will be getting them tomorrow, c/w neurology and a GI evaluation.  We will continue monthly loop monitoring, his wife would like to have f/u here as well, will schedule for 3 months though primaruily will follow with [facility].  They will get a copy of the testing for "heart aneurysm" for his record here.  Current medicines are reviewed at length with the patient today.  The patient did not have any concerns regarding medicines.  Haywood Lasso, PA-C 10/17/2016 12:28 PM     [address] [place] [address] [phone] (office)  [phone] (fax)

## 2016-10-17 ENCOUNTER — Ambulatory Visit (INDEPENDENT_AMBULATORY_CARE_PROVIDER_SITE_OTHER): Payer: Medicare Other | Admitting: Physician Assistant

## 2016-10-17 VITALS — BP 180/98 | HR 58 | Ht 76.5 in | Wt 202.0 lb

## 2016-10-17 DIAGNOSIS — I951 Orthostatic hypotension: Secondary | ICD-10-CM | POA: Diagnosis not present

## 2016-10-17 DIAGNOSIS — Z95818 Presence of other cardiac implants and grafts: Secondary | ICD-10-CM

## 2016-10-17 DIAGNOSIS — I10 Essential (primary) hypertension: Secondary | ICD-10-CM

## 2016-10-17 NOTE — Patient Instructions (Signed)
Medication Instructions:   Your physician recommends that you continue on your current medications as directed. Please refer to the Current Medication list given to you today.   If you need a refill on your cardiac medications before your next appointment, please call your pharmacy.  Labwork: NONE ORDERED  TODAY    Testing/Procedures: NONE ORDERED  TODAY    Follow-Up: IN 3 MONTHS WITH DR ALLRED    Any Other Special Instructions Will Be Listed Below (If Applicable).

## 2016-10-18 ENCOUNTER — Ambulatory Visit (INDEPENDENT_AMBULATORY_CARE_PROVIDER_SITE_OTHER): Payer: Medicare Other | Admitting: Neurology

## 2016-10-18 DIAGNOSIS — G40A09 Absence epileptic syndrome, not intractable, without status epilepticus: Secondary | ICD-10-CM | POA: Diagnosis not present

## 2016-10-19 ENCOUNTER — Ambulatory Visit (INDEPENDENT_AMBULATORY_CARE_PROVIDER_SITE_OTHER): Payer: Medicare Other | Admitting: *Deleted

## 2016-10-19 DIAGNOSIS — I639 Cerebral infarction, unspecified: Secondary | ICD-10-CM

## 2016-10-19 NOTE — Progress Notes (Signed)
Carelink Summary Report / Loop Recorder 

## 2016-10-23 ENCOUNTER — Telehealth: Payer: Self-pay

## 2016-10-23 NOTE — Telephone Encounter (Signed)
-----   Message from Garvin Fila, MD sent at 10/21/2016 10:07 PM EST ----- Kindly inform patient that EEG study was normal

## 2016-10-23 NOTE — Telephone Encounter (Signed)
Rn call patients wife Cody Silva on [place] form. EEG was normal. Pts wife verbalized understanding.

## 2016-10-28 LAB — CUP PACEART REMOTE DEVICE CHECK
Implantable Pulse Generator Implant Date: 20170421
MDC IDC SESS DTM: 20180116161339

## 2016-11-09 LAB — CUP PACEART REMOTE DEVICE CHECK
Date Time Interrogation Session: 20180215154120
Implantable Pulse Generator Implant Date: 20170421

## 2016-11-15 ENCOUNTER — Ambulatory Visit: Payer: Medicare Other | Admitting: Family Medicine

## 2016-11-20 ENCOUNTER — Ambulatory Visit (INDEPENDENT_AMBULATORY_CARE_PROVIDER_SITE_OTHER): Payer: Medicare Other | Admitting: *Deleted

## 2016-11-20 DIAGNOSIS — I639 Cerebral infarction, unspecified: Secondary | ICD-10-CM | POA: Diagnosis not present

## 2016-11-20 NOTE — Progress Notes (Signed)
Carelink Summary Report / Loop Recorder 

## 2016-12-01 LAB — CUP PACEART REMOTE DEVICE CHECK
Implantable Pulse Generator Implant Date: 20170421
MDC IDC SESS DTM: 20180317160740

## 2016-12-01 NOTE — Progress Notes (Signed)
Carelink summary report received. Battery status OK. Normal device function. No new symptom episodes, tachy episodes, brady, or pause episodes. No new AF episodes. Monthly summary reports and ROV/PRN 

## 2016-12-18 ENCOUNTER — Ambulatory Visit (INDEPENDENT_AMBULATORY_CARE_PROVIDER_SITE_OTHER): Payer: Medicare Other | Admitting: *Deleted

## 2016-12-18 DIAGNOSIS — I639 Cerebral infarction, unspecified: Secondary | ICD-10-CM | POA: Diagnosis not present

## 2016-12-18 NOTE — Progress Notes (Signed)
Carelink Summary Report / Loop Recorder 

## 2016-12-26 ENCOUNTER — Encounter: Payer: Self-pay | Admitting: Internal Medicine

## 2016-12-27 ENCOUNTER — Encounter: Payer: Self-pay | Admitting: Neurology

## 2016-12-31 LAB — CUP PACEART REMOTE DEVICE CHECK
Implantable Pulse Generator Implant Date: 20170421
MDC IDC SESS DTM: 20180416163651

## 2016-12-31 NOTE — Progress Notes (Signed)
Carelink summary report received. Battery status OK. Normal device function. No new symptom episodes, tachy episodes, brady, or pause episodes. No new AF episodes. Monthly summary reports and ROV/PRN 

## 2017-01-02 ENCOUNTER — Encounter (HOSPITAL_COMMUNITY): Payer: Self-pay

## 2017-01-02 ENCOUNTER — Emergency Department (HOSPITAL_COMMUNITY): Payer: Medicare Other

## 2017-01-02 ENCOUNTER — Emergency Department (HOSPITAL_COMMUNITY)
Admission: EM | Admit: 2017-01-02 | Discharge: 2017-01-03 | Disposition: A | Discharge status: Home / Self Care | Payer: Medicare Other | Attending: Emergency Medicine | Admitting: Emergency Medicine

## 2017-01-02 DIAGNOSIS — E1122 Type 2 diabetes mellitus with diabetic chronic kidney disease: Secondary | ICD-10-CM | POA: Diagnosis not present

## 2017-01-02 DIAGNOSIS — N182 Chronic kidney disease, stage 2 (mild): Secondary | ICD-10-CM | POA: Insufficient documentation

## 2017-01-02 DIAGNOSIS — E1140 Type 2 diabetes mellitus with diabetic neuropathy, unspecified: Secondary | ICD-10-CM | POA: Insufficient documentation

## 2017-01-02 DIAGNOSIS — Z8673 Personal history of transient ischemic attack (TIA), and cerebral infarction without residual deficits: Secondary | ICD-10-CM | POA: Diagnosis not present

## 2017-01-02 DIAGNOSIS — R569 Unspecified convulsions: Secondary | ICD-10-CM | POA: Diagnosis not present

## 2017-01-02 DIAGNOSIS — I129 Hypertensive chronic kidney disease with stage 1 through stage 4 chronic kidney disease, or unspecified chronic kidney disease: Secondary | ICD-10-CM | POA: Insufficient documentation

## 2017-01-02 DIAGNOSIS — E11319 Type 2 diabetes mellitus with unspecified diabetic retinopathy without macular edema: Secondary | ICD-10-CM | POA: Insufficient documentation

## 2017-01-02 DIAGNOSIS — Z794 Long term (current) use of insulin: Secondary | ICD-10-CM | POA: Diagnosis not present

## 2017-01-02 DIAGNOSIS — Z79899 Other long term (current) drug therapy: Secondary | ICD-10-CM | POA: Diagnosis not present

## 2017-01-02 LAB — BASIC METABOLIC PANEL
Anion gap: 6 (ref 5–15)
BUN: 22 mg/dL — AB (ref 6–20)
CHLORIDE: 102 mmol/L (ref 101–111)
CO2: 27 mmol/L (ref 22–32)
Calcium: 8.8 mg/dL — ABNORMAL LOW (ref 8.9–10.3)
Creatinine, Ser: 2.23 mg/dL — ABNORMAL HIGH (ref 0.61–1.24)
GFR calc Af Amer: 33 mL/min — ABNORMAL LOW (ref >60)
GFR calc non Af Amer: 28 mL/min — ABNORMAL LOW (ref >60)
GLUCOSE: 150 mg/dL — AB (ref 65–99)
POTASSIUM: 3.1 mmol/L — AB (ref 3.5–5.1)
SODIUM: 135 mmol/L (ref 135–145)

## 2017-01-02 LAB — CBC
HEMATOCRIT: 32.6 % — AB (ref 39.0–52.0)
Hemoglobin: 11 g/dL — ABNORMAL LOW (ref 13.0–17.0)
MCH: 29.8 pg (ref 26.0–34.0)
MCHC: 33.7 g/dL (ref 30.0–36.0)
MCV: 88.3 fL (ref 78.0–100.0)
Platelets: 181 10*3/uL (ref 150–400)
RBC: 3.69 MIL/uL — ABNORMAL LOW (ref 4.22–5.81)
RDW: 13 % (ref 11.5–15.5)
WBC: 5 10*3/uL (ref 4.0–10.5)

## 2017-01-02 LAB — CBG MONITORING, ED: Glucose-Capillary: 149 mg/dL — ABNORMAL HIGH (ref 65–99)

## 2017-01-02 LAB — I-STAT TROPONIN, ED: Troponin i, poc: 0.01 ng/mL (ref 0.00–0.08)

## 2017-01-02 LAB — TROPONIN I: Troponin I: <0.03 ng/mL (ref <0.03)

## 2017-01-02 MED ORDER — HYDRALAZINE HCL 20 MG/ML IJ SOLN
5.0000 mg | Freq: Once | INTRAMUSCULAR | Status: AC
  Administered 2017-01-02: 5 mg via INTRAVENOUS
  Filled 2017-01-02: qty 1

## 2017-01-02 MED ORDER — SODIUM CHLORIDE 0.9 % IV BOLUS (SEPSIS)
1000.0000 mL | Freq: Once | INTRAVENOUS | Status: AC
  Administered 2017-01-02: 1000 mL via INTRAVENOUS

## 2017-01-02 MED ORDER — POTASSIUM CHLORIDE CRYS ER 20 MEQ PO TBCR
40.0000 meq | EXTENDED_RELEASE_TABLET | Freq: Once | ORAL | Status: AC
  Administered 2017-01-02: 40 meq via ORAL
  Filled 2017-01-02: qty 2

## 2017-01-02 MED ORDER — HYDRALAZINE HCL 20 MG/ML IJ SOLN: 10.0000 mg | Freq: Once | INTRAMUSCULAR | Status: DC

## 2017-01-02 MED ORDER — HYDRALAZINE HCL 20 MG/ML IJ SOLN
5.0000 mg | Freq: Once | INTRAMUSCULAR | Status: AC
  Administered 2017-01-03: 5 mg via INTRAVENOUS
  Filled 2017-01-02: qty 1

## 2017-01-02 NOTE — ED Provider Notes (Signed)
[place] DEPT Provider Note   CSN: [id number] Arrival date & time: 01/02/17  1821     History   Chief Complaint Chief Complaint  Patient presents with  . Loss of Consciousness    HPI Cody Silva is a 71 y.o. male.  The history is provided by the patient and the EMS personnel.  Loss of Consciousness   This is a new problem. The current episode started 1 to 2 hours ago. The problem occurs rarely. The problem has been resolved. Length of episode of loss of consciousness: Unclear. The problem is associated with normal activity (was sitting at a table reading). Associated symptoms include diaphoresis, vomiting (after he woke up) and weakness (chronic). Pertinent negatives include abdominal pain, back pain, bowel incontinence, chest pain, confusion, dizziness, fever, focal sensory loss, headaches, light-headedness, malaise/fatigue, palpitations, seizures, slurred speech and visual change. He has tried nothing for the symptoms. His past medical history is significant for CVA, DM, HTN and TIA.    Past Medical History:  Diagnosis Date  . Anemia   . Arthritis    "right arm, right ankle, right side" (10/30/2014)  . Colon polyps   . Diabetic neuropathy ([place])   . Diabetic retinopathy ([place])   . H/O agent Orange exposure   . Headache    "@ least 3 times/wk" (10/30/2014)  . History of blood transfusion 10/30/2014   hematochezia  . History of gout   . Hypertension   . OSA on CPAP    "suppose to wear mask; I've got a call in for an equipment change" (10/30/2014)  . PTSD (post-traumatic stress disorder)    "service related"  . Stroke [facility]) 2014   left extremity deficits; facial left  . Type II diabetes mellitus [facility])     Patient Active Problem List   Diagnosis Date Noted  . OSA (obstructive sleep apnea) 09/12/2016  . Weight loss 09/12/2016  . Malnutrition of moderate degree 09/11/2016  . Syncope 09/10/2016  . H/O agent Orange exposure 12/24/2015  . Type 2 diabetes with  nephropathy ([place])   . Near syncope 12/22/2015  . History of TIAs 09/14/2015  . CKD stage 3 due to type 2 diabetes mellitus ([place]) 09/14/2015  . Acute lower GI bleeding 10/30/2014  . History of colonic polyps 10/30/2014  . Depression 10/30/2014  . Symptomatic anemia 10/30/2014  . AKI (acute kidney injury) ([place]) 10/30/2014  . Ataxic gait 09/21/2014  . History of CVA (cerebrovascular accident) 02/22/2013  . Abnormal brain scan 02/21/2013  . Dizziness 02/21/2013  . Weakness 02/21/2013  . Diabetes mellitus ([place]) 02/21/2013  . Hypertension 02/21/2013    Past Surgical History:  Procedure Laterality Date  . BONE GRAFT HIP ILIAC CREST Left ~ 1976  . CATARACT EXTRACTION W/ INTRAOCULAR LENS  IMPLANT, BILATERAL Bilateral 2014-2015  . EP IMPLANTABLE DEVICE N/A 12/24/2015   Procedure: Loop Recorder Insertion;  Surgeon: Thompson Grayer, MD;  Location: [place] CV LAB;  Service: Cardiovascular;  Laterality: N/A;  . TUMOR REMOVAL Right ~ 1976   "arm; had to take bone left hip to add to the repair"       Home Medications    Prior to Admission medications   Medication Sig Start Date End Date Taking? Authorizing Provider  acetaminophen (TYLENOL) 500 MG tablet Take 1 tablet (500 mg total) by mouth every 6 (six) hours as needed for mild pain, moderate pain, fever or headache. 11/01/14   Juluis Mire, MD  albuterol (PROVENTIL HFA;VENTOLIN HFA) 108 (90 BASE) MCG/ACT inhaler Inhale 2  puffs into the lungs every 6 (six) hours as needed for wheezing.    Historical Provider, MD  amLODipine (NORVASC) 10 MG tablet Take 10 mg by mouth daily.    Historical Provider, MD  atorvastatin (LIPITOR) 20 MG tablet Take 1 tablet (20 mg total) by mouth daily at 6 PM. 09/12/16   Asencion Partridge, MD  carvedilol (COREG) 25 MG tablet Take 50 mg by mouth 2 (two) times daily. Reported on 09/14/2015    Historical Provider, MD  Cholecalciferol (CVS VITAMIN D) 2000 units CAPS TAKE 1 TABLET (2,000 UNITS TOTAL) BY MOUTH DAILY. 09/12/16    Historical Provider, MD  citalopram (CELEXA) 20 MG tablet Take 20 mg by mouth daily.    Historical Provider, MD  clopidogrel (PLAVIX) 75 MG tablet Take 1 tablet (75 mg total) by mouth at bedtime. 11/08/14   Juluis Mire, MD  divalproex (DEPAKOTE ER) 500 MG 24 hr tablet Take 1 tablet (500 mg total) by mouth daily. 10/03/16   Garvin Fila, MD  feeding supplement, ENSURE ENLIVE, (ENSURE ENLIVE) LIQD Take 237 mLs by mouth 2 (two) times daily after a meal. 09/12/16   Asencion Partridge, MD  fluocinonide cream (LIDEX) 0.93 % Apply 1 application topically daily.     Historical Provider, MD  fluticasone (FLONASE) 50 MCG/ACT nasal spray Place 2 sprays into the nose daily as needed for allergies.     Historical Provider, MD  gabapentin (NEURONTIN) 300 MG capsule Take 300 mg by mouth daily as needed (leg pain).     Historical Provider, MD  Ginkgo Biloba (GNP GINGKO BILOBA EXTRACT PO) Take 1 capsule by mouth daily.    Historical Provider, MD  HYDROcodone-acetaminophen (NORCO) 10-325 MG tablet Take 1 tablet by mouth every 6 (six) hours as needed.     Historical Provider, MD  insulin aspart (NOVOLOG) 100 UNIT/ML injection Inject 5-15 Units into the skin 3 (three) times daily with meals. Per sliding scale    Historical Provider, MD  insulin glargine (LANTUS) 100 UNIT/ML injection Inject 0-30 Units into the skin at bedtime.     Historical Provider, MD  latanoprost (XALATAN) 0.005 % ophthalmic solution Place 1 drop into both eyes at bedtime.     Historical Provider, MD  loratadine (CLARITIN) 10 MG tablet Take 10 mg by mouth daily as needed for allergies.    Historical Provider, MD  losartan (COZAAR) 100 MG tablet Take 100 mg by mouth daily.    Historical Provider, MD  Multiple Vitamins-Minerals (MULTIVITAMIN PO) Take 1 tablet by mouth daily.    Historical Provider, MD  polyvinyl alcohol (LIQUIFILM TEARS) 1.4 % ophthalmic solution Place 1 drop into both eyes 5 (five) times daily as needed (for dry eyes).    Historical  Provider, MD  Skin Protectants, Misc. (EUCERIN) cream Apply 1 application topically daily.     Historical Provider, MD  spironolactone (ALDACTONE) 25 MG tablet Take 25 mg by mouth daily.    Historical Provider, MD  UNKNOWN TO PATIENT Place 1 drop into both eyes every morning. Other eye drop, unknown name  (received from [place] Provider, MD  vitamin B-12 (CYANOCOBALAMIN) 1000 MCG tablet Take by mouth.    Historical Provider, MD    Family History Family History  Problem Relation Age of Onset  . Diabetes Mother   . Breast cancer Mother   . Heart attack Mother     CABG - Age 97  . Stroke Brother   . Lung disease Father   . Neurofibromatosis  Maternal Uncle     Social History Social History  Substance Use Topics  . Smoking status: Never Smoker  . Smokeless tobacco: Never Used  . Alcohol use 0.6 oz/week    1 Cans of beer per week     Comment: 10/30/2014 "might have a beer a couple times/yr"     Allergies   Metformin and related   Review of Systems Review of Systems  Constitutional: Positive for diaphoresis. Negative for chills, fever and malaise/fatigue.  HENT: Negative for ear pain and sore throat.   Eyes: Negative for pain and visual disturbance.  Respiratory: Negative for cough and shortness of breath.   Cardiovascular: Positive for syncope. Negative for chest pain and palpitations.  Gastrointestinal: Positive for vomiting (after he woke up). Negative for abdominal pain and bowel incontinence.  Genitourinary: Negative for dysuria and hematuria.  Musculoskeletal: Negative for arthralgias and back pain.  Skin: Negative for color change and rash.  Neurological: Positive for syncope and weakness (chronic). Negative for dizziness, seizures, light-headedness and headaches.  Psychiatric/Behavioral: Negative for confusion.  All other systems reviewed and are negative.    Physical Exam Updated Vital Signs SpO2 100%   Physical Exam  Constitutional: He is oriented  to person, place, and time. He appears well-developed and well-nourished.  HENT:  Head: Normocephalic and atraumatic.  Eyes: Conjunctivae are normal. Pupils are equal, round, and reactive to light.  Neck: Neck supple.  Cardiovascular: Normal rate and regular rhythm.   No murmur heard. Pulmonary/Chest: Effort normal and breath sounds normal. No respiratory distress.  Abdominal: Soft. There is no tenderness.  Musculoskeletal: He exhibits no edema.  Neurological: He is alert and oriented to person, place, and time.  CN 2-12 grossly intact, residual 4+/5 strength in RUE & RLE from prior CVA, no focal sensory deficits, gait deferred  Skin: Skin is warm and dry.  Psychiatric: He has a normal mood and affect.  Nursing note and vitals reviewed.    ED Treatments / Results  Labs (all labs ordered are listed, but only abnormal results are displayed) Labs Reviewed - No data to display  EKG  EKG Interpretation None       Radiology No results found.  Procedures Procedures (including critical care time)  Medications Ordered in ED Medications - No data to display   Initial Impression / Assessment and Plan / ED Course  I have reviewed the triage vital signs and the nursing notes.  Pertinent labs & imaging results that were available during my care of the patient were reviewed by me and considered in my medical decision making (see chart for details).    Pt with h/o CVA with residual right hemiparesis, TIA, insulin-dependent T2DM, HTN, HLD, gout, CKD3B, PTSD, OSA, & glaucoma presents after an episode of unresponsiveness. According to the patient twice, he said he felt weak" this morning but was able to run errands and go out to lunch without any difficulty. They both took naps after returning home, and at some point she noticed him snoring; she tried to call to him and wake him up, but he didn't respond. She also notes that his left side clenched up & he was "drooling from the mouth" so  she called 911. The Pt came when being cared for by the FD after what the wife estimates was 5-85mins & had an episode of emesis shortly after; she says he returned to baseline before EMS arrived to load him in the ambulance. She notes that this has happened in the past,  but the length of time he spends unresponsive appears to be lengthening & the drooling/vomiting was new; he was admitted in January and after an extensive workup, his neurologist told them that the episodes might be TIAs. Pt endorses a decreased appetite for the last several months, but denies F/C, HA, lightheadedness, cough, CP, SOB, nausea, sick contacts, or recent illness. Pt is at his neurological baseline on presentation which was confirmed by the wife.  VS & exam as above. EKG: NSR @ 64bpm w/LVH & TWI in V5-6 that are more prominent than on tracing from 09/27/16.  Labs remarkable for K 3.1, Crt 2.23, undetectable troponin. NS bolus & PO K given in the ED. Hydralazine & home medications given for HTN. CT head w/NAICA.  Neurology consulted by phone; believe this sounds more like a seizure than previous episodes. Recommending getting LFTs & ammonia level; if normal they would load Depakote at 20cc/kg (1800mg ) & d/c w/rx for 5mg /kg (500mg ) TID. Pt would need to see his Neurologist in 1wk to get his blood level checked.  Explained all results to the Pt. Will discharge the Pt home with prescription for Depakote. Recommending follow-up with Neurology in 1wk. ED return precautions provided. Pt acknowledged understanding of, and concurrence with the plan. All questions answered to his satisfaction. In stable condition at the time of discharge.  Final Clinical Impressions(s) / ED Diagnoses   Final diagnoses:  Seizure-like activity [facility])    New Prescriptions New Prescriptions   No medications on file     Jenny Reichmann, MD 01/03/17 1428    Duffy Bruce, MD 01/04/17 1135

## 2017-01-02 NOTE — ED Triage Notes (Signed)
[facility]- pt coming from home after a syncopal episode. He had LOC lasting approximately 15 minutes per family. He has had similar episodes in the past prior to having a stroke. Upon EMS arrival pt noted to be very diaphoretic. Once pt regained conciousness he was a&o X4.

## 2017-01-02 NOTE — ED Notes (Signed)
Escorted to bathroom by EMT, no urine specimen collected. Will attempt to recollect.

## 2017-01-02 NOTE — ED Notes (Signed)
MD at bedside. 

## 2017-01-02 NOTE — ED Notes (Signed)
Prompted to provide urine sample 

## 2017-01-02 NOTE — ED Notes (Signed)
Most recent BP 225/112, notified Isaacs MD. MD wants pressure to run high for possible stroke workup. See new orders.

## 2017-01-02 NOTE — ED Notes (Signed)
Spoke to lab, will add on Troponin.

## 2017-01-03 DIAGNOSIS — R569 Unspecified convulsions: Secondary | ICD-10-CM | POA: Diagnosis not present

## 2017-01-03 LAB — URINALYSIS, ROUTINE W REFLEX MICROSCOPIC
BACTERIA UA: NONE SEEN
BILIRUBIN URINE: NEGATIVE
GLUCOSE, UA: NEGATIVE mg/dL
KETONES UR: NEGATIVE mg/dL
LEUKOCYTES UA: NEGATIVE
NITRITE: NEGATIVE
PROTEIN: 100 mg/dL — AB
Specific Gravity, Urine: 1.01 (ref 1.005–1.030)
Squamous Epithelial / LPF: NONE SEEN
pH: 6 (ref 5.0–8.0)

## 2017-01-03 LAB — AMMONIA: AMMONIA: 23 umol/L (ref 9–35)

## 2017-01-03 LAB — HEPATIC FUNCTION PANEL
ALT: 14 U/L — ABNORMAL LOW (ref 17–63)
AST: 26 U/L (ref 15–41)
Albumin: 3.4 g/dL — ABNORMAL LOW (ref 3.5–5.0)
Alkaline Phosphatase: 56 U/L (ref 38–126)
BILIRUBIN DIRECT: 0.4 mg/dL (ref 0.1–0.5)
BILIRUBIN INDIRECT: 0.6 mg/dL (ref 0.3–0.9)
TOTAL PROTEIN: 6.9 g/dL (ref 6.5–8.1)
Total Bilirubin: 1 mg/dL (ref 0.3–1.2)

## 2017-01-03 MED ORDER — CARVEDILOL 12.5 MG PO TABS: 25.0000 mg | ORAL_TABLET | Freq: Two times a day (BID) | ORAL | Status: DC

## 2017-01-03 MED ORDER — LOSARTAN POTASSIUM 50 MG PO TABS
100.0000 mg | ORAL_TABLET | Freq: Once | ORAL | Status: DC
  Filled 2017-01-03: qty 2

## 2017-01-03 MED ORDER — VALPROATE SODIUM 500 MG/5ML IV SOLN
20.0000 mg/kg | Freq: Once | INTRAVENOUS | Status: AC
  Administered 2017-01-03: 1840 mg via INTRAVENOUS
  Filled 2017-01-03: qty 18.4

## 2017-01-03 MED ORDER — DIVALPROEX SODIUM 500 MG PO DR TAB
500.0000 mg | DELAYED_RELEASE_TABLET | Freq: Three times a day (TID) | ORAL | 0 refills | Status: DC
Start: 2017-01-03 — End: 2017-05-03

## 2017-01-03 MED ORDER — CARVEDILOL 12.5 MG PO TABS
25.0000 mg | ORAL_TABLET | Freq: Once | ORAL | Status: AC
  Administered 2017-01-03: 25 mg via ORAL
  Filled 2017-01-03: qty 2

## 2017-01-03 NOTE — ED Notes (Signed)
Pt never started depakote PO at home. D/C valproic acid level per Ericka Pontiff MD

## 2017-01-03 NOTE — ED Notes (Signed)
Pt ambulated with one person assist in hallway to bathroom. Pt reported some lightheadedness once standing up and walking. Pt denies any other sx. Pt states he usually walks with a cane.

## 2017-01-08 ENCOUNTER — Observation Stay (HOSPITAL_COMMUNITY)
Admission: EM | Admit: 2017-01-08 | Discharge: 2017-01-10 | Disposition: A | Discharge status: Home / Self Care | Payer: Medicare Other | Attending: Student in an Organized Health Care Education/Training Program | Admitting: Student in an Organized Health Care Education/Training Program

## 2017-01-08 ENCOUNTER — Emergency Department (HOSPITAL_COMMUNITY): Payer: Medicare Other

## 2017-01-08 ENCOUNTER — Encounter (HOSPITAL_COMMUNITY): Payer: Self-pay | Admitting: Emergency Medicine

## 2017-01-08 DIAGNOSIS — I6330 Cerebral infarction due to thrombosis of unspecified cerebral artery: Secondary | ICD-10-CM | POA: Insufficient documentation

## 2017-01-08 DIAGNOSIS — N189 Chronic kidney disease, unspecified: Secondary | ICD-10-CM | POA: Diagnosis not present

## 2017-01-08 DIAGNOSIS — R4182 Altered mental status, unspecified: Secondary | ICD-10-CM | POA: Diagnosis present

## 2017-01-08 DIAGNOSIS — F4310 Post-traumatic stress disorder, unspecified: Secondary | ICD-10-CM | POA: Diagnosis not present

## 2017-01-08 DIAGNOSIS — E785 Hyperlipidemia, unspecified: Secondary | ICD-10-CM | POA: Diagnosis not present

## 2017-01-08 DIAGNOSIS — G4733 Obstructive sleep apnea (adult) (pediatric): Secondary | ICD-10-CM | POA: Diagnosis not present

## 2017-01-08 DIAGNOSIS — Z888 Allergy status to other drugs, medicaments and biological substances status: Secondary | ICD-10-CM | POA: Insufficient documentation

## 2017-01-08 DIAGNOSIS — R569 Unspecified convulsions: Secondary | ICD-10-CM | POA: Diagnosis not present

## 2017-01-08 DIAGNOSIS — E876 Hypokalemia: Secondary | ICD-10-CM | POA: Insufficient documentation

## 2017-01-08 DIAGNOSIS — Z91048 Other nonmedicinal substance allergy status: Secondary | ICD-10-CM | POA: Insufficient documentation

## 2017-01-08 DIAGNOSIS — Z794 Long term (current) use of insulin: Secondary | ICD-10-CM | POA: Diagnosis not present

## 2017-01-08 DIAGNOSIS — N400 Enlarged prostate without lower urinary tract symptoms: Secondary | ICD-10-CM | POA: Insufficient documentation

## 2017-01-08 DIAGNOSIS — G459 Transient cerebral ischemic attack, unspecified: Principal | ICD-10-CM | POA: Insufficient documentation

## 2017-01-08 DIAGNOSIS — H409 Unspecified glaucoma: Secondary | ICD-10-CM | POA: Diagnosis not present

## 2017-01-08 DIAGNOSIS — E1140 Type 2 diabetes mellitus with diabetic neuropathy, unspecified: Secondary | ICD-10-CM | POA: Diagnosis not present

## 2017-01-08 DIAGNOSIS — Z9114 Patient's other noncompliance with medication regimen: Secondary | ICD-10-CM | POA: Insufficient documentation

## 2017-01-08 DIAGNOSIS — Z7902 Long term (current) use of antithrombotics/antiplatelets: Secondary | ICD-10-CM | POA: Diagnosis not present

## 2017-01-08 DIAGNOSIS — E11319 Type 2 diabetes mellitus with unspecified diabetic retinopathy without macular edema: Secondary | ICD-10-CM | POA: Insufficient documentation

## 2017-01-08 DIAGNOSIS — Z79899 Other long term (current) drug therapy: Secondary | ICD-10-CM | POA: Diagnosis not present

## 2017-01-08 DIAGNOSIS — R29810 Facial weakness: Secondary | ICD-10-CM | POA: Diagnosis present

## 2017-01-08 DIAGNOSIS — I129 Hypertensive chronic kidney disease with stage 1 through stage 4 chronic kidney disease, or unspecified chronic kidney disease: Secondary | ICD-10-CM | POA: Diagnosis not present

## 2017-01-08 DIAGNOSIS — G9349 Other encephalopathy: Secondary | ICD-10-CM | POA: Insufficient documentation

## 2017-01-08 DIAGNOSIS — E1122 Type 2 diabetes mellitus with diabetic chronic kidney disease: Secondary | ICD-10-CM | POA: Insufficient documentation

## 2017-01-08 DIAGNOSIS — E7849 Other hyperlipidemia: Secondary | ICD-10-CM

## 2017-01-08 DIAGNOSIS — I69351 Hemiplegia and hemiparesis following cerebral infarction affecting right dominant side: Secondary | ICD-10-CM | POA: Diagnosis not present

## 2017-01-08 LAB — COMPREHENSIVE METABOLIC PANEL
ALT: 9 U/L — ABNORMAL LOW (ref 17–63)
ANION GAP: 11 (ref 5–15)
AST: 20 U/L (ref 15–41)
Albumin: 3.6 g/dL (ref 3.5–5.0)
Alkaline Phosphatase: 59 U/L (ref 38–126)
BILIRUBIN TOTAL: 1 mg/dL (ref 0.3–1.2)
BUN: 25 mg/dL — ABNORMAL HIGH (ref 6–20)
CO2: 24 mmol/L (ref 22–32)
Calcium: 9.4 mg/dL (ref 8.9–10.3)
Chloride: 104 mmol/L (ref 101–111)
Creatinine, Ser: 2.33 mg/dL — ABNORMAL HIGH (ref 0.61–1.24)
GFR calc Af Amer: 31 mL/min — ABNORMAL LOW (ref >60)
GFR, EST NON AFRICAN AMERICAN: 27 mL/min — AB (ref >60)
Glucose, Bld: 224 mg/dL — ABNORMAL HIGH (ref 65–99)
POTASSIUM: 4 mmol/L (ref 3.5–5.1)
Sodium: 139 mmol/L (ref 135–145)
TOTAL PROTEIN: 6.9 g/dL (ref 6.5–8.1)

## 2017-01-08 LAB — CBC WITH DIFFERENTIAL/PLATELET
BASOS PCT: 0 %
Basophils Absolute: 0 10*3/uL (ref 0.0–0.1)
EOS ABS: 0 10*3/uL (ref 0.0–0.7)
Eosinophils Relative: 0 %
HCT: 31.8 % — ABNORMAL LOW (ref 39.0–52.0)
HEMOGLOBIN: 10.9 g/dL — AB (ref 13.0–17.0)
LYMPHS ABS: 0.7 10*3/uL (ref 0.7–4.0)
Lymphocytes Relative: 14 %
MCH: 30 pg (ref 26.0–34.0)
MCHC: 34.3 g/dL (ref 30.0–36.0)
MCV: 87.6 fL (ref 78.0–100.0)
Monocytes Absolute: 0.4 10*3/uL (ref 0.1–1.0)
Monocytes Relative: 7 %
NEUTROS ABS: 4.1 10*3/uL (ref 1.7–7.7)
Neutrophils Relative %: 79 %
Platelets: 158 10*3/uL (ref 150–400)
RBC: 3.63 MIL/uL — AB (ref 4.22–5.81)
RDW: 12.7 % (ref 11.5–15.5)
WBC: 5.3 10*3/uL (ref 4.0–10.5)

## 2017-01-08 LAB — I-STAT CHEM 8, ED
BUN: 29 mg/dL — ABNORMAL HIGH (ref 6–20)
CREATININE: 2.3 mg/dL — AB (ref 0.61–1.24)
Calcium, Ion: 1.19 mmol/L (ref 1.15–1.40)
Chloride: 100 mmol/L — ABNORMAL LOW (ref 101–111)
GLUCOSE: 219 mg/dL — AB (ref 65–99)
HCT: 34 % — ABNORMAL LOW (ref 39.0–52.0)
HEMOGLOBIN: 11.6 g/dL — AB (ref 13.0–17.0)
Potassium: 4 mmol/L (ref 3.5–5.1)
Sodium: 140 mmol/L (ref 135–145)
TCO2: 26 mmol/L (ref 0–100)

## 2017-01-08 LAB — I-STAT TROPONIN, ED: TROPONIN I, POC: 0.02 ng/mL (ref 0.00–0.08)

## 2017-01-08 LAB — ETHANOL: Alcohol, Ethyl (B): <5 mg/dL (ref <5)

## 2017-01-08 LAB — PROTIME-INR
INR: 1.13
PROTHROMBIN TIME: 14.6 s (ref 11.4–15.2)

## 2017-01-08 LAB — CBG MONITORING, ED: GLUCOSE-CAPILLARY: 197 mg/dL — AB (ref 65–99)

## 2017-01-08 LAB — APTT: aPTT: 21 seconds — ABNORMAL LOW (ref 24–36)

## 2017-01-08 MED ORDER — TAMSULOSIN HCL 0.4 MG PO CAPS
0.8000 mg | ORAL_CAPSULE | Freq: Every day | ORAL | Status: DC
  Administered 2017-01-09 (×2): 0.8 mg via ORAL
  Filled 2017-01-08 (×2): qty 2

## 2017-01-08 MED ORDER — LATANOPROST 0.005 % OP SOLN
1.0000 [drp] | Freq: Every day | OPHTHALMIC | Status: DC
  Administered 2017-01-09: 1 [drp] via OPHTHALMIC
  Filled 2017-01-08 (×2): qty 2.5

## 2017-01-08 MED ORDER — SERTRALINE HCL 100 MG PO TABS
100.0000 mg | ORAL_TABLET | Freq: Every day | ORAL | Status: DC
  Administered 2017-01-09 – 2017-01-10 (×2): 100 mg via ORAL
  Filled 2017-01-08 (×2): qty 1

## 2017-01-08 MED ORDER — VALPROATE SODIUM 500 MG/5ML IV SOLN
500.0000 mg | Freq: Three times a day (TID) | INTRAVENOUS | Status: DC
  Administered 2017-01-09 (×2): 500 mg via INTRAVENOUS
  Filled 2017-01-08 (×4): qty 5

## 2017-01-08 NOTE — ED Notes (Signed)
Patient aware an urine specimen needed.  Unable to urinate at this time.

## 2017-01-08 NOTE — ED Notes (Signed)
depacon drip delayed doctors at bedside examining pt

## 2017-01-08 NOTE — H&P (Signed)
Date: 01/09/2017               Patient Name:  Cody Silva MRN: [id number]  DOB: [birth year] Age / Sex: 71 y.o., male   PCP: [facility] [place] Service: Internal Medicine Teaching Service         Attending Physician: Dr. Evette Doffing, Mallie Mussel, *    First Contact: Dr. Danford Bad Pager: [phone]  Second Contact: Dr. Benjamine Mola  Pager: [phone]        After Hours (After 5p/  First Contact Pager: [phone]  weekends / holidays): Second Contact Pager: [phone]   Chief Complaint: weakness and difficulty   History of Present Illness:  71 yo M [employer] with PMH CVA with residual difficulties with balance, TIA, insulin dependent T2DM, HTN, HLD, gout, CKD stage 3, PTSD, OSA on CPAP with poor compliance, and glaucoma who was recently brought into the ED via EMS on 5/3 after a 15 minute episodes of unresponsiveness, drooling, clenched left side, and diaphoresis followed by vomiting which resolved with return to baseline. Head CT showed a chronic infarct. He was dx with possible seizure and discharged with an Rx for depakote. After the ED visit he had weakness and fatigue. He saw his nephrologist two days after and his nephrologist was concerned that he had a stroke.  He presented to the ED this evening after his daughter found him unable to speak when she arrived home this evening. He describes having difficulty finding his words at the time but denies loss of consciousness. His wife and daughter noticed that the right side of his face was drooping, slurred speech and right grip weakness. Denies nausea, abdominal discomfort, or palpitations prior to the episode. Denies syncope. Denies incontinence or tongue biting.   In the ED he was afebrile, HR 60s, BP 190/90s, SpO2 100% on room air. Labs revealed Na 139, k 4.0. Bicarb 24, BUN 25, crt 2.3 (b/l 1.9 ), glucose 224, GFR 30, WBC 5.0, hgb 11 (b/l 11), neg tropx2. Code stroke was called, CT head revealed no acute abnormalities and  stable chronic b/l lacunar infarcts.   Meds:  Current Meds  Medication Sig  . acetaminophen (TYLENOL) 500 MG tablet Take 1 tablet (500 mg total) by mouth every 6 (six) hours as needed for mild pain, moderate pain, fever or headache.  . albuterol (PROVENTIL HFA;VENTOLIN HFA) 108 (90 BASE) MCG/ACT inhaler Inhale 2 puffs into the lungs every 6 (six) hours as needed for wheezing.  [name] atorvastatin (LIPITOR) 80 MG tablet Take 80 mg by mouth at bedtime.  . carvedilol (COREG) 25 MG tablet Take 25 mg by mouth 2 (two) times daily. Reported on 09/14/2015  . cetirizine (ZYRTEC) 10 MG tablet Take 10 mg by mouth at bedtime as needed for allergies.  . chlorthalidone (HYGROTON) 25 MG tablet Take 25 mg by mouth daily.  . cloNIDine (CATAPRES - DOSED IN MG/24 HR) 0.2 mg/24hr patch Place 0.2 mg onto the skin once a week.  . clopidogrel (PLAVIX) 75 MG tablet Take 1 tablet (75 mg total) by mouth at bedtime. (Patient taking differently: Take 75 mg by mouth daily. )  . divalproex (DEPAKOTE) 500 MG DR tablet Take 1 tablet (500 mg total) by mouth 3 (three) times daily.  . feeding supplement, ENSURE ENLIVE, (ENSURE ENLIVE) LIQD Take 237 mLs by mouth 2 (two) times daily after a meal. (Patient taking differently: Take 237 mLs by mouth daily. )  .  fluocinonide cream (LIDEX) 8.10 % Apply 1 application topically daily.   . fluticasone (FLONASE) 50 MCG/ACT nasal spray Place 2 sprays into both nostrils daily as needed for allergies.   [name] gabapentin (NEURONTIN) 300 MG capsule Take 300 mg by mouth at bedtime.   . Ginkgo Biloba (GNP GINGKO BILOBA EXTRACT PO) Take 1 capsule by mouth 2 (two) times a week.   . insulin aspart (NOVOLOG) 100 UNIT/ML injection Inject 5-15 Units into the skin 3 (three) times daily with meals. Per sliding scale  . insulin glargine (LANTUS) 100 UNIT/ML injection Inject 20 Units into the skin at bedtime.   [name] latanoprost (XALATAN) 0.005 % ophthalmic solution Place 1 drop into both eyes at bedtime.   [name] losartan  (COZAAR) 100 MG tablet Take 100 mg by mouth daily.  . Multiple Vitamins-Minerals (MULTIVITAMIN PO) Take 1 tablet by mouth daily.  . polyvinyl alcohol (LIQUIFILM TEARS) 1.4 % ophthalmic solution Place 1 drop into both eyes 5 (five) times daily as needed (for dry eyes).  . potassium chloride (K-DUR,KLOR-CON) 10 MEQ tablet Take 10 mEq by mouth 2 (two) times daily.  . sertraline (ZOLOFT) 100 MG tablet Take 100 mg by mouth daily.  . Skin Protectants, Misc. (EUCERIN) cream Apply 1 application topically daily. FEET AND LEGS  . tamsulosin (FLOMAX) 0.4 MG CAPS capsule Take 0.8 mg by mouth at bedtime.     Allergies: Allergies as of 01/08/2017 - Review Complete 01/08/2017  Allergen Reaction Noted  . Metformin and related Diarrhea 06/12/2013  . Tape Rash 01/02/2017   Past Medical History:  Diagnosis Date  . Anemia   . Arthritis    "right arm, right ankle, right side" (10/30/2014)  . Colon polyps   . Diabetic neuropathy ([place])   . Diabetic retinopathy ([place])   . H/O agent Orange exposure   . Headache    "@ least 3 times/wk" (10/30/2014)  . History of blood transfusion 10/30/2014   hematochezia  . History of gout   . Hypertension   . OSA on CPAP    "suppose to wear mask; I've got a call in for an equipment change" (10/30/2014)  . PTSD (post-traumatic stress disorder)    "service related"  . Stroke [facility]) 2014   left extremity deficits; facial left  . Type II diabetes mellitus (H[facility])     Family History:  Family History  Problem Relation Age of Onset  . Diabetes Mother   . Breast cancer Mother   . Heart attack Mother     CABG - Age 10  . Kidney disease Mother   . Stroke Brother   . Lung disease Father   . Neurofibromatosis Maternal Uncle   . Throat cancer Brother   . Hypertension Brother    Social History:  Denies smoking, alcohol, or illicit drug use.   Review of Systems: A complete ROS was negative except as per HPI.   Physical Exam: Blood pressure (!) 199/96, pulse 65,  temperature 98.1 F (36.7 C), resp. rate 17, height 6' 4.5" (1.943 m), weight 194 lb (88 kg), SpO2 100 %. Physical Exam  Constitutional: He is oriented to person, place, and time and well-developed, well-nourished, and in no distress. No distress.  HENT:  Head: Normocephalic and atraumatic.  No tongue lacerations   Eyes: Pupils are equal, round, and reactive to light. Right eye exhibits no discharge. Left eye exhibits no discharge. No scleral icterus.  Cardiovascular: Normal rate, regular rhythm and intact distal pulses.   1/6 diastolic murmur, no calf tenderness,  no peripheral edema   Pulmonary/Chest: No respiratory distress. He has no wheezes. He has no rales.  Abdominal: Soft. Bowel sounds are normal. He exhibits no distension. There is no tenderness. There is no guarding.  Neurological: He is alert and oriented to person, place, and time. A cranial nerve deficit is present.  Right sided facial droop Strength 5/5 in upper and lower extremities    Skin: Skin is warm and dry. He is not diaphoretic.  Psychiatric: Mood, affect and judgment normal.   EKG: sinus rhythm, normal axis, left atrial enlargement, possible ST segment depressions in inferior leads   CT head- personal review of the image reveals no acute intracranial abnormalities   Assessment & Plan by Problem: Active Problems:   Altered mental status   Facial droop  Right facial droop  Patient presents after a 30 minute episode of multiple neuro deficits- slurred speech and word finding difficulties, facial droop, and right sided weakness. CT head did not reveal an acute intracranial abnormality but he has a residual right sided facial droop so will need further stroke workup. He has multiple risk factors including hx of stroke, TIA, OSA with CPAP non compliance, poorly controlled htn, hld, and diabetes. Was started on depakote in the ED last week after suspected seizure.  - ordered MRI brain  -F/U EEG  -tele monitoring  -F/U  EKG in AM  -neuro checks  -continue home med atorvastatin 20 mg  -continue home med plavix  -ordered PT/OT eval and treatment   ? Seizure Hx  - continue home med valproate  - ordered valproic acid level   Hypertension  BP is 200/90s at presentation, his new facial droop is concerning for stroke so we will allow for permissive hypertension and maintain BP less than 220/110.  - ordered home med carvedilol  - holding home meds chlorthalidone and losartan and he has a clonidine patch on  Diabetes  Last A1c 09/2016 7.1, on lantus 20 U qHS and Novolog ISS at home.  - sensitive ISS  - Lantus  - carb modified diet  -continue home med gabapentin   PTSD  - continue home med sertraline   Seasonal allergies  - continue home med flonase and claritin PRN   Glaucoma  -Continue home med xalatan opthalmic solution   BPH  - continue home med tamsulosin   Dispo: Admit patient to Observation with expected length of stay less than 2 midnights.  Signed: Ledell Noss, MD 01/09/2017, 1:05 AM  Pager: [phone]

## 2017-01-08 NOTE — ED Triage Notes (Addendum)
Pt was having problems slurring speech and not getting words out. Daughter called EMS.  Hx of strokes.

## 2017-01-08 NOTE — Consult Note (Signed)
Neurology Consultation Reason for Consult: Episode of aphasia Referring Physician: Stark Jock, D  CC: Episode of aphasia  History is obtained from: Patient, family  HPI: Cody Silva is a 71 y.o. male with a history of multiple episodes recently of loss of consciousness. With his most recent episode, there is description of his right-sided twitching concerning for possible partial seizure and he was started on Depakote. He states that he has been taking this medication.  Tonight, he was last seen well around 7 PM, and following this his daughter came home and found him around 7:50 PM and he was unable to speak to her. He states that he remembers his daughter finding him, and that he had difficulty finding his words. He does not think he had any episode of loss of consciousness this evening.    LKW: 7 PM tpa given?: no, rapidly improving symptoms    ROS: A 14 point ROS was performed and is negative except as noted in the HPI.   Past Medical History:  Diagnosis Date  . Anemia   . Arthritis    "right arm, right ankle, right side" (10/30/2014)  . Colon polyps   . Diabetic neuropathy ([place])   . Diabetic retinopathy ([place])   . H/O agent Orange exposure   . Headache    "@ least 3 times/wk" (10/30/2014)  . History of blood transfusion 10/30/2014   hematochezia  . History of gout   . Hypertension   . OSA on CPAP    "suppose to wear mask; I've got a call in for an equipment change" (10/30/2014)  . PTSD (post-traumatic stress disorder)    "service related"  . Stroke [facility]) 2014   left extremity deficits; facial left  . Type II diabetes mellitus (HCC)      Family History  Problem Relation Age of Onset  . Diabetes Mother   . Breast cancer Mother   . Heart attack Mother     CABG - Age 46  . Stroke Brother   . Lung disease Father   . Neurofibromatosis Maternal Uncle      Social History:  reports that he has never smoked. He has never used smokeless tobacco. He reports that he  drinks about 0.6 oz of alcohol per week . He reports that he does not use drugs.   Exam: Current vital signs: BP (!) 186/91   Pulse (!) 55   Temp 98.1 F (36.7 C)   Resp 17   Ht 6' 4.5" (1.943 m)   Wt 88 kg (194 lb)   SpO2 100%   BMI 23.31 kg/m  Vital signs in last 24 hours: Temp:  [98.1 F (36.7 C)-98.4 F (36.9 C)] 98.1 F (36.7 C) (05/07 2143) Pulse Rate:  [55-69] 55 (05/07 2245) Resp:  [14-18] 17 (05/07 2230) BP: (177-201)/(81-100) 186/91 (05/07 2245) SpO2:  [100 %] 100 % (05/07 2245) Weight:  [88 kg (194 lb)-88.1 kg (194 lb 3.2 oz)] 88 kg (194 lb) (05/07 2117)   Physical Exam  Constitutional: Appears well-developed and well-nourished.  Psych: Affect appropriate to situation Eyes: No scleral injection HENT: No OP obstrucion Head: Normocephalic.  Cardiovascular: Normal rate and regular rhythm.  Respiratory: Effort normal and breath sounds normal to anterior ascultation GI: Soft.  No distension. There is no tenderness.  Skin: WDI  Neuro: Mental Status: Patient is awake, alert, oriented to person, place, month, year, and situation. Patient is able to give a clear and coherent history. No signs of aphasia or neglect Cranial Nerves:  II: Visual Fields are full. Pupils are equal, round, and reactive to light.   III,IV, VI: EOMI without ptosis or diploplia.  V: Facial sensation is symmetric to temperature VII: Facial movement with right facial weakness VIII: hearing is intact to voice X: Uvula elevates symmetrically XI: Shoulder shrug is symmetric. XII: tongue is midline without atrophy or fasciculations.  Motor: Tone is normal. Bulk is normal. 5/5 strength was present  On the left side, he has mild right arm weakness 4+/5 Sensory: Sensation is symmetric to light touch and temperature in the arms and legs. Cerebellar: No clear ataxia   I have reviewed labs in epic and the results pertinent to this consultation are: BMP-mildly elevated creatinine  I have  reviewed the images obtained: CT head-no acute findings  Impression: 71 year old male with a history of stroke with new onset episodes of loss of consciousness with episode of confusion tonight. I do wonder if his daughter found him in a postictal state, but given the description that he has had increased weakness over the past week at would favor ruling out recurrent stroke with an MRI.  Recommendations: 1) continue home Depakote 2) MRI brain 3) EEG   Roland Rack, MD [facility] [phone]  If 7pm- 7am, please page neurology on call as listed in [place].

## 2017-01-08 NOTE — ED Provider Notes (Signed)
[facility] DEPT Provider Note   CSN: [id number] Arrival date & time: 01/08/17  2053   An emergency department physician performed an initial assessment on this suspected stroke patient at 2055.  History   Chief Complaint Chief Complaint  Patient presents with  . Code Stroke    HPI Cody Silva is a 71 y.o. male.  Patient is a 71 year old male with past medical history of hypertension, prior CVA with right-sided weakness, diabetes. He presents today for evaluation of weakness, slurred speech, and confusion that started this evening. He was last seen by his wife at approximately 7:00 and was in his normal state of health. His daughter got home and checked on him and noted that he was having difficulty speaking and that his left arm was weak. They also felt as though he had a droop and his speech was slurred. 911 was called and his symptoms began to improve while in route.   The history is provided by the patient and a relative.    Past Medical History:  Diagnosis Date  . Anemia   . Arthritis    "right arm, right ankle, right side" (10/30/2014)  . Colon polyps   . Diabetic neuropathy ([place])   . Diabetic retinopathy ([place])   . H/O agent Orange exposure   . Headache    "@ least 3 times/wk" (10/30/2014)  . History of blood transfusion 10/30/2014   hematochezia  . History of gout   . Hypertension   . OSA on CPAP    "suppose to wear mask; I've got a call in for an equipment change" (10/30/2014)  . PTSD (post-traumatic stress disorder)    "service related"  . Stroke [facility]) 2014   left extremity deficits; facial left  . Type II diabetes mellitus [facility])     Patient Active Problem List   Diagnosis Date Noted  . OSA (obstructive sleep apnea) 09/12/2016  . Weight loss 09/12/2016  . Malnutrition of moderate degree 09/11/2016  . Syncope 09/10/2016  . H/O agent Orange exposure 12/24/2015  . Type 2 diabetes with nephropathy ([place])   . Near syncope 12/22/2015  . History of TIAs  09/14/2015  . CKD stage 3 due to type 2 diabetes mellitus ([place]) 09/14/2015  . Acute lower GI bleeding 10/30/2014  . History of colonic polyps 10/30/2014  . Depression 10/30/2014  . Symptomatic anemia 10/30/2014  . AKI (acute kidney injury) ([place]) 10/30/2014  . Ataxic gait 09/21/2014  . History of CVA (cerebrovascular accident) 02/22/2013  . Abnormal brain scan 02/21/2013  . Dizziness 02/21/2013  . Weakness 02/21/2013  . Diabetes mellitus ([place]) 02/21/2013  . Hypertension 02/21/2013    Past Surgical History:  Procedure Laterality Date  . BONE GRAFT HIP ILIAC CREST Left ~ 1976  . CATARACT EXTRACTION W/ INTRAOCULAR LENS  IMPLANT, BILATERAL Bilateral 2014-2015  . EP IMPLANTABLE DEVICE N/A 12/24/2015   Procedure: Loop Recorder Insertion;  Surgeon: Thompson Grayer, MD;  Location: [facility];  Service: Cardiovascular;  Laterality: N/A;  . TUMOR REMOVAL Right ~ 1976   "arm; had to take bone left hip to add to the repair"       Home Medications    Prior to Admission medications   Medication Sig Start Date End Date Taking? Authorizing Provider  acetaminophen (TYLENOL) 500 MG tablet Take 1 tablet (500 mg total) by mouth every 6 (six) hours as needed for mild pain, moderate pain, fever or headache. 11/01/14  Yes Rabbani, Marjan, MD  albuterol (PROVENTIL HFA;VENTOLIN HFA) 108 (90  BASE) MCG/ACT inhaler Inhale 2 puffs into the lungs every 6 (six) hours as needed for wheezing.   Yes [provider]  atorvastatin (LIPITOR) 80 MG tablet Take 80 mg by mouth at bedtime.   Yes [provider]  carvedilol (COREG) 25 MG tablet Take 25 mg by mouth 2 (two) times daily. Reported on 09/14/2015   Yes [provider]  cetirizine (ZYRTEC) 10 MG tablet Take 10 mg by mouth at bedtime as needed for allergies.   Yes [provider]  chlorthalidone (HYGROTON) 25 MG tablet Take 25 mg by mouth daily.   Yes [provider]  cloNIDine (CATAPRES - DOSED IN MG/24 HR) 0.2  mg/24hr patch Place 0.2 mg onto the skin once a week.   Yes [provider]  clopidogrel (PLAVIX) 75 MG tablet Take 1 tablet (75 mg total) by mouth at bedtime. Patient taking differently: Take 75 mg by mouth daily.  11/08/14  Yes Juluis Mire, MD  divalproex (DEPAKOTE) 500 MG DR tablet Take 1 tablet (500 mg total) by mouth 3 (three) times daily. 01/03/17 02/02/17 Yes Jenny Reichmann, MD  feeding supplement, ENSURE ENLIVE, (ENSURE ENLIVE) LIQD Take 237 mLs by mouth 2 (two) times daily after a meal. Patient taking differently: Take 237 mLs by mouth daily.  09/12/16  Yes Asencion Partridge, MD  fluocinonide cream (LIDEX) 3.15 % Apply 1 application topically daily.    Yes [provider]  fluticasone (FLONASE) 50 MCG/ACT nasal spray Place 2 sprays into both nostrils daily as needed for allergies.    Yes [provider]  gabapentin (NEURONTIN) 300 MG capsule Take 300 mg by mouth at bedtime.    Yes [provider]  Ginkgo Biloba (GNP GINGKO BILOBA EXTRACT PO) Take 1 capsule by mouth 2 (two) times a week.    Yes [provider]  insulin aspart (NOVOLOG) 100 UNIT/ML injection Inject 5-15 Units into the skin 3 (three) times daily with meals. Per sliding scale   Yes [provider]  insulin glargine (LANTUS) 100 UNIT/ML injection Inject 20 Units into the skin at bedtime.    Yes [provider]  latanoprost (XALATAN) 0.005 % ophthalmic solution Place 1 drop into both eyes at bedtime.    Yes [provider]  losartan (COZAAR) 100 MG tablet Take 100 mg by mouth daily.   Yes [provider]  Multiple Vitamins-Minerals (MULTIVITAMIN PO) Take 1 tablet by mouth daily.   Yes [provider]  polyvinyl alcohol (LIQUIFILM TEARS) 1.4 % ophthalmic solution Place 1 drop into both eyes 5 (five) times daily as needed (for dry eyes).   Yes [provider]  potassium chloride (K-DUR,KLOR-CON) 10 MEQ tablet Take 10 mEq by mouth 2 (two)  times daily.   Yes [provider]  sertraline (ZOLOFT) 100 MG tablet Take 100 mg by mouth daily.   Yes [provider]  Skin Protectants, Misc. (EUCERIN) cream Apply 1 application topically daily. FEET AND LEGS   Yes [provider]  tamsulosin (FLOMAX) 0.4 MG CAPS capsule Take 0.8 mg by mouth at bedtime.   Yes [provider]  atorvastatin (LIPITOR) 20 MG tablet Take 1 tablet (20 mg total) by mouth daily at 6 PM. Patient not taking: Reported on 01/02/2017 09/12/16   Asencion Partridge, MD    Family History Family History  Problem Relation Age of Onset  . Diabetes Mother   . Breast cancer Mother   . Heart attack Mother     CABG - Age 70  .  Stroke Brother   . Lung disease Father   . Neurofibromatosis Maternal Uncle     Social History Social History  Substance Use Topics  . Smoking status: Never Smoker  . Smokeless tobacco: Never Used  . Alcohol use 0.6 oz/week    1 Cans of beer per week     Comment: 10/30/2014 "might have a beer a couple times/yr"     Allergies   Metformin and related and Tape   Review of Systems Review of Systems  All other systems reviewed and are negative.    Physical Exam Updated Vital Signs BP (!) 201/100   Pulse 68   Temp 98.1 F (36.7 C)   Resp 14   Ht 6' 4.5" (1.943 m)   Wt 194 lb (88 kg)   SpO2 100%   BMI 23.31 kg/m   Physical Exam  Constitutional: He is oriented to person, place, and time. He appears well-developed and well-nourished. No distress.  HENT:  Head: Normocephalic and atraumatic.  Mouth/Throat: Oropharynx is clear and moist.  Neck: Normal range of motion. Neck supple.  Cardiovascular: Normal rate and regular rhythm.  Exam reveals no friction rub.   No murmur heard. Pulmonary/Chest: Effort normal and breath sounds normal. No respiratory distress. He has no wheezes. He has no rales.  Abdominal: Soft. Bowel sounds are normal. He exhibits no distension. There is no tenderness.  Musculoskeletal:  Normal range of motion. He exhibits no edema.  Neurological: He is alert and oriented to person, place, and time. No cranial nerve deficit. He exhibits abnormal muscle tone. Coordination normal.  There is bilateral upper and lower weakness, the left greater than right.  Skin: Skin is warm and dry. He is not diaphoretic.  Nursing note and vitals reviewed.    ED Treatments / Results  Labs (all labs ordered are listed, but only abnormal results are displayed) Labs Reviewed  COMPREHENSIVE METABOLIC PANEL - Abnormal; Notable for the following:       Result Value   Glucose, Bld 224 (*)    BUN 25 (*)    Creatinine, Ser 2.33 (*)    ALT 9 (*)    GFR calc non Af Amer 27 (*)    GFR calc Af Amer 31 (*)    All other components within normal limits  APTT - Abnormal; Notable for the following:    aPTT 21 (*)    All other components within normal limits  CBC WITH DIFFERENTIAL/PLATELET - Abnormal; Notable for the following:    RBC 3.63 (*)    Hemoglobin 10.9 (*)    HCT 31.8 (*)    All other components within normal limits  I-STAT CHEM 8, ED - Abnormal; Notable for the following:    Chloride 100 (*)    BUN 29 (*)    Creatinine, Ser 2.30 (*)    Glucose, Bld 219 (*)    Hemoglobin 11.6 (*)    HCT 34.0 (*)    All other components within normal limits  CBG MONITORING, ED - Abnormal; Notable for the following:    Glucose-Capillary 197 (*)    All other components within normal limits  ETHANOL  PROTIME-INR  RAPID URINE DRUG SCREEN, HOSP PERFORMED  URINALYSIS, ROUTINE W REFLEX MICROSCOPIC  I-STAT TROPOININ, ED    EKG  EKG Interpretation  Date/Time:  Monday Jan 08 2017 21:29:40 EDT Ventricular Rate:  70 PR Interval:    QRS Duration: 115 QT Interval:  463 QTC Calculation: 500 R Axis:   49 Text Interpretation:  Sinus rhythm Probable left atrial enlargement Nonspecific intraventricular conduction delay Minimal ST depression, inferior leads Confirmed by Donni Oglesby  MD, Hae Ahlers ([id number]) on 01/08/2017  11:15:26 PM       Radiology Ct Head Code Stroke W/o Cm  Result Date: 01/08/2017 CLINICAL DATA:  Code stroke.  Acute aphasia EXAM: CT HEAD WITHOUT CONTRAST TECHNIQUE: Contiguous axial images were obtained from the base of the skull through the vertex without intravenous contrast. COMPARISON:  Head CT 01/02/2017. FINDINGS: Brain: No mass lesion, intraparenchymal hemorrhage or extra-axial collection. No evidence of acute cortical infarct. Unchanged appearance of old bilateral lenticulocapsular lacunar infarcts. Vascular: Atherosclerotic calcification of the vertebral and internal carotid arteries at the skull base. Skull: Normal visualized skull base, calvarium and extracranial soft tissues. Sinuses/Orbits: No sinus fluid levels or advanced mucosal thickening. No mastoid effusion. Normal orbits. ASPECTS [facility] Stroke Program Early CT Score) - Ganglionic level infarction (caudate, lentiform nuclei, internal capsule, insula, M1-M3 cortex): 7 - Supraganglionic infarction (M4-M6 cortex): 3 Total score (0-10 with 10 being normal): 10 IMPRESSION: 1. No acute intracranial abnormality. Unchanged appearance of chronic bilateral lenticulocapsular lacunar infarcts. 2. ASPECTS is 10. These results were called by telephone at the time of interpretation on 01/08/2017 at 9:19 pm to Dr. Roland Rack, who verbally acknowledged these results. Electronically Signed   By: Ulyses Jarred M.D.   On: 01/08/2017 21:20    Procedures Procedures (including critical care time)  Medications Ordered in ED Medications - No data to display   Initial Impression / Assessment and Plan / ED Course  I have reviewed the triage vital signs and the nursing notes.  Pertinent labs & imaging results that were available during my care of the patient were reviewed by me and considered in my medical decision making (see chart for details).  Patient presents here with weakness and slurred speech, concerning for a TIA versus CVA. He  arrived here as a code stroke. He was seen immediately upon arrival and went to radiology for a CT scan. This was read as negative. His laboratory studies returned as unremarkable.  His neurologic deficits resolved and he is currently without complaint. He was seen by Dr. Leonel Ramsay from neurology who is recommending admission. He will be admitted to the teaching service for further evaluation.  Final Clinical Impressions(s) / ED Diagnoses   Final diagnoses:  None    New Prescriptions New Prescriptions   No medications on file     Veryl Speak, MD 01/08/17 2319

## 2017-01-08 NOTE — ED Notes (Signed)
Admitting doctors at the bedside 

## 2017-01-08 NOTE — ED Notes (Signed)
Patient's family states that his face is not normally drawn up on left side.  This is new.  Family states speech seems to be back to normal.

## 2017-01-09 ENCOUNTER — Observation Stay (HOSPITAL_BASED_OUTPATIENT_CLINIC_OR_DEPARTMENT_OTHER)
Admit: 2017-01-09 | Discharge: 2017-01-09 | Disposition: A | Discharge status: Home / Self Care | Payer: Medicare Other | Attending: Neurology | Admitting: Neurology

## 2017-01-09 ENCOUNTER — Ambulatory Visit: Payer: Medicare Other | Admitting: Nurse Practitioner

## 2017-01-09 ENCOUNTER — Observation Stay (HOSPITAL_COMMUNITY): Payer: Medicare Other

## 2017-01-09 DIAGNOSIS — J302 Other seasonal allergic rhinitis: Secondary | ICD-10-CM

## 2017-01-09 DIAGNOSIS — R569 Unspecified convulsions: Secondary | ICD-10-CM | POA: Diagnosis not present

## 2017-01-09 DIAGNOSIS — Z803 Family history of malignant neoplasm of breast: Secondary | ICD-10-CM

## 2017-01-09 DIAGNOSIS — R29810 Facial weakness: Secondary | ICD-10-CM | POA: Diagnosis present

## 2017-01-09 DIAGNOSIS — H409 Unspecified glaucoma: Secondary | ICD-10-CM

## 2017-01-09 DIAGNOSIS — Z91048 Other nonmedicinal substance allergy status: Secondary | ICD-10-CM

## 2017-01-09 DIAGNOSIS — E1139 Type 2 diabetes mellitus with other diabetic ophthalmic complication: Secondary | ICD-10-CM

## 2017-01-09 DIAGNOSIS — Z823 Family history of stroke: Secondary | ICD-10-CM

## 2017-01-09 DIAGNOSIS — R29818 Other symptoms and signs involving the nervous system: Secondary | ICD-10-CM | POA: Diagnosis not present

## 2017-01-09 DIAGNOSIS — Z888 Allergy status to other drugs, medicaments and biological substances status: Secondary | ICD-10-CM

## 2017-01-09 DIAGNOSIS — R55 Syncope and collapse: Secondary | ICD-10-CM

## 2017-01-09 DIAGNOSIS — E7849 Other hyperlipidemia: Secondary | ICD-10-CM

## 2017-01-09 DIAGNOSIS — Z833 Family history of diabetes mellitus: Secondary | ICD-10-CM

## 2017-01-09 DIAGNOSIS — F4310 Post-traumatic stress disorder, unspecified: Secondary | ICD-10-CM

## 2017-01-09 DIAGNOSIS — I6330 Cerebral infarction due to thrombosis of unspecified cerebral artery: Secondary | ICD-10-CM | POA: Diagnosis not present

## 2017-01-09 DIAGNOSIS — I129 Hypertensive chronic kidney disease with stage 1 through stage 4 chronic kidney disease, or unspecified chronic kidney disease: Secondary | ICD-10-CM | POA: Diagnosis not present

## 2017-01-09 DIAGNOSIS — H42 Glaucoma in diseases classified elsewhere: Secondary | ICD-10-CM

## 2017-01-09 DIAGNOSIS — E785 Hyperlipidemia, unspecified: Secondary | ICD-10-CM

## 2017-01-09 DIAGNOSIS — Z9119 Patient's noncompliance with other medical treatment and regimen: Secondary | ICD-10-CM

## 2017-01-09 DIAGNOSIS — Z800 Family history of malignant neoplasm of digestive organs: Secondary | ICD-10-CM

## 2017-01-09 DIAGNOSIS — E784 Other hyperlipidemia: Secondary | ICD-10-CM | POA: Diagnosis not present

## 2017-01-09 DIAGNOSIS — N183 Chronic kidney disease, stage 3 (moderate): Secondary | ICD-10-CM | POA: Diagnosis not present

## 2017-01-09 DIAGNOSIS — G459 Transient cerebral ischemic attack, unspecified: Secondary | ICD-10-CM | POA: Diagnosis not present

## 2017-01-09 DIAGNOSIS — R4182 Altered mental status, unspecified: Secondary | ICD-10-CM | POA: Diagnosis present

## 2017-01-09 DIAGNOSIS — Z841 Family history of disorders of kidney and ureter: Secondary | ICD-10-CM

## 2017-01-09 DIAGNOSIS — E1159 Type 2 diabetes mellitus with other circulatory complications: Secondary | ICD-10-CM | POA: Diagnosis not present

## 2017-01-09 DIAGNOSIS — G9340 Encephalopathy, unspecified: Secondary | ICD-10-CM

## 2017-01-09 DIAGNOSIS — M109 Gout, unspecified: Secondary | ICD-10-CM | POA: Diagnosis not present

## 2017-01-09 DIAGNOSIS — E1122 Type 2 diabetes mellitus with diabetic chronic kidney disease: Secondary | ICD-10-CM | POA: Diagnosis not present

## 2017-01-09 DIAGNOSIS — Z8673 Personal history of transient ischemic attack (TIA), and cerebral infarction without residual deficits: Secondary | ICD-10-CM

## 2017-01-09 DIAGNOSIS — G4733 Obstructive sleep apnea (adult) (pediatric): Secondary | ICD-10-CM

## 2017-01-09 DIAGNOSIS — Z820 Family history of epilepsy and other diseases of the nervous system: Secondary | ICD-10-CM

## 2017-01-09 DIAGNOSIS — Z9989 Dependence on other enabling machines and devices: Secondary | ICD-10-CM

## 2017-01-09 DIAGNOSIS — Z79899 Other long term (current) drug therapy: Secondary | ICD-10-CM

## 2017-01-09 DIAGNOSIS — Z794 Long term (current) use of insulin: Secondary | ICD-10-CM

## 2017-01-09 DIAGNOSIS — N400 Enlarged prostate without lower urinary tract symptoms: Secondary | ICD-10-CM

## 2017-01-09 DIAGNOSIS — Z8249 Family history of ischemic heart disease and other diseases of the circulatory system: Secondary | ICD-10-CM

## 2017-01-09 LAB — GLUCOSE, CAPILLARY
GLUCOSE-CAPILLARY: 129 mg/dL — AB (ref 65–99)
Glucose-Capillary: 154 mg/dL — ABNORMAL HIGH (ref 65–99)
Glucose-Capillary: 148 mg/dL — ABNORMAL HIGH (ref 65–99)
Glucose-Capillary: 153 mg/dL — ABNORMAL HIGH (ref 65–99)
Glucose-Capillary: 159 mg/dL — ABNORMAL HIGH (ref 65–99)

## 2017-01-09 MED ORDER — INSULIN ASPART 100 UNIT/ML ~~LOC~~ SOLN
0.0000 [IU] | Freq: Three times a day (TID) | SUBCUTANEOUS | Status: DC
  Administered 2017-01-09 (×2): 2 [IU] via SUBCUTANEOUS
  Administered 2017-01-09: 1 [IU] via SUBCUTANEOUS

## 2017-01-09 MED ORDER — ALBUTEROL SULFATE (2.5 MG/3ML) 0.083% IN NEBU: 2.5000 mg | INHALATION_SOLUTION | Freq: Four times a day (QID) | RESPIRATORY_TRACT | Status: DC | PRN

## 2017-01-09 MED ORDER — ENOXAPARIN SODIUM 40 MG/0.4ML ~~LOC~~ SOLN
40.0000 mg | SUBCUTANEOUS | Status: DC
  Administered 2017-01-09 – 2017-01-10 (×2): 40 mg via SUBCUTANEOUS
  Filled 2017-01-09 (×2): qty 0.4

## 2017-01-09 MED ORDER — CARVEDILOL 12.5 MG PO TABS
25.0000 mg | ORAL_TABLET | Freq: Two times a day (BID) | ORAL | Status: DC
  Administered 2017-01-09 – 2017-01-10 (×4): 25 mg via ORAL
  Filled 2017-01-09 (×4): qty 2

## 2017-01-09 MED ORDER — FLUTICASONE PROPIONATE 50 MCG/ACT NA SUSP: 2.0000 | Freq: Every day | NASAL | Status: DC | PRN

## 2017-01-09 MED ORDER — GABAPENTIN 300 MG PO CAPS
300.0000 mg | ORAL_CAPSULE | Freq: Every day | ORAL | Status: DC
  Administered 2017-01-09 (×2): 300 mg via ORAL
  Filled 2017-01-09 (×2): qty 1

## 2017-01-09 MED ORDER — ACETAMINOPHEN 325 MG PO TABS: 650.0000 mg | ORAL_TABLET | Freq: Four times a day (QID) | ORAL | Status: DC | PRN

## 2017-01-09 MED ORDER — ASPIRIN EC 325 MG PO TBEC
325.0000 mg | DELAYED_RELEASE_TABLET | Freq: Every day | ORAL | Status: DC
  Administered 2017-01-09 – 2017-01-10 (×2): 325 mg via ORAL
  Filled 2017-01-09 (×2): qty 1

## 2017-01-09 MED ORDER — INSULIN GLARGINE 100 UNIT/ML ~~LOC~~ SOLN
10.0000 [IU] | Freq: Every day | SUBCUTANEOUS | Status: DC
Start: 2017-01-09 — End: 2017-01-10
  Administered 2017-01-09 (×2): 10 [IU] via SUBCUTANEOUS
  Filled 2017-01-09 (×3): qty 0.1

## 2017-01-09 MED ORDER — LORATADINE 10 MG PO TABS: 10.0000 mg | ORAL_TABLET | Freq: Every day | ORAL | Status: DC | PRN

## 2017-01-09 MED ORDER — ACETAMINOPHEN 650 MG RE SUPP: 650.0000 mg | Freq: Four times a day (QID) | RECTAL | Status: DC | PRN

## 2017-01-09 MED ORDER — ATORVASTATIN CALCIUM 10 MG PO TABS
20.0000 mg | ORAL_TABLET | Freq: Every day | ORAL | Status: DC
  Administered 2017-01-09 (×2): 20 mg via ORAL
  Filled 2017-01-09: qty 2
  Filled 2017-01-09: qty 1

## 2017-01-09 MED ORDER — DIVALPROEX SODIUM 500 MG PO DR TAB
500.0000 mg | DELAYED_RELEASE_TABLET | Freq: Three times a day (TID) | ORAL | Status: DC
  Administered 2017-01-09 – 2017-01-10 (×4): 500 mg via ORAL
  Filled 2017-01-09 (×4): qty 1

## 2017-01-09 MED ORDER — CLOPIDOGREL BISULFATE 75 MG PO TABS
75.0000 mg | ORAL_TABLET | Freq: Every day | ORAL | Status: DC
  Administered 2017-01-09 (×2): 75 mg via ORAL
  Filled 2017-01-09 (×2): qty 1

## 2017-01-09 NOTE — Progress Notes (Signed)
Occupational Therapy Evaluation Patient Details Name: Cody Silva MRN: [id number] DOB: December 09, 1945 Today's Date: 01/09/2017    History of Present Illness 71yo gentleman with PMH of CVA with some mild right hemiparesis remaining, TIA, DM2, HTN, CKD, HLD, OSA, PTSD presents with new onset episodes of loss of consciousness with episode of confusion. neuro work up underway. MRI mm focus of diffusion hyperintensity with intermediate ADC and posterior limb R internal capsule   Clinical Impression   PTA, pt modified independent with ADL and mobility at cane level. Pt demonstrates a decline in functional status and will benefit form acute OT due to below deficits to facilitate safe DC home with HHOT and 2/7 S initially.     Follow Up Recommendations  Home health OT;Supervision/Assistance - 24 hour    Equipment Recommendations  None recommended by OT    Recommendations for Other Services       Precautions / Restrictions Precautions Precautions: Fall      Mobility Bed Mobility Overal bed mobility: Needs Assistance Bed Mobility: Supine to Sit     Supine to sit: Min assist     General bed mobility comments: OOB in chair  Transfers Overall transfer level: Needs assistance Equipment used: 1 person hand held assist Transfers: Sit to/from Stand Sit to Stand: Min assist         General transfer comment: min assist for stability during power up, noted instability initially, HHA provided    Balance Overall balance assessment: Needs assistance;History of Falls (history of falls "up the steps" per wife)   Sitting balance-Leahy Scale: Good Sitting balance - Comments: able to sit self supported without difficulty     Standing balance-Leahy Scale: Fair (poor to fair) Standing balance comment: increased sway noted in static standing, some difficulty accepting challange at times             High level balance activites: Direction changes;Turns;Head turns High Level Balance  Comments: min assist with noted instbaility during higher level balance tasks. reports modest dizziness with positional head change           ADL either performed or assessed with clinical judgement   ADL Overall ADL's : Needs assistance/impaired Eating/Feeding: Set up   Grooming: Set up   Upper Body Bathing: Set up;Sitting   Lower Body Bathing: Minimal assistance;Sit to/from stand   Upper Body Dressing : Minimal assistance;Sitting   Lower Body Dressing: Minimal assistance;Sit to/from stand   Toilet Transfer: Minimal assistance;Ambulation           Functional mobility during ADLs: Minimal assistance;Cueing for safety       Vision Baseline Vision/History: Legally blind;Glaucoma (blind L eye) Patient Visual Report: Other (comment) (will further assess) Additional Comments: unable to read at baseline due to bvisual deficits. Has "readre" at home     Perception     Praxis      Pertinent Vitals/Pain Pain Assessment: No/denies pain     Hand Dominance Right   Extremity/Trunk Assessment Upper Extremity Assessment Upper Extremity Assessment: RUE deficits/detail RUE Deficits / Details: Brunstrom level VI - isolated movements but impaired coordination and funcitonal use R hand; apparent atrophy intrinsics; Wife reports decreased use of R hand compared to baseline RUE Coordination: decreased fine motor   Lower Extremity Assessment Lower Extremity Assessment: Defer to PT evaluation RLE Deficits / Details: hx of R weakness at baseline   Cervical / Trunk Assessment Cervical / Trunk Assessment: Other exceptions (R bias)   Communication Communication Communication: No difficulties   Cognition Arousal/Alertness:  Awake/alert Behavior During Therapy: Flat affect Overall Cognitive Status: Impaired/Different from baseline Area of Impairment: Problem solving;Attention;Memory;Awareness                   Current Attention Level: Selective Memory: Decreased  short-term memory     Awareness: Emergent Problem Solving: Slow processing General Comments: wife at bedside reports patient a little "slower" than usual. Attention appears to impact postural control   General Comments       Exercises     Shoulder Instructions      Home Living Family/patient expects to be discharged to:: Private residence Living Arrangements: Spouse/significant other Available Help at Discharge: Family;Available 24 hours/day Type of Home: House Home Access: Stairs to enter [employer] of Steps: 1 Entrance Stairs-Rails: None Home Layout: Two level;Bed/bath upstairs Alternate Level Stairs-Number of Steps: flight Alternate Level Stairs-Rails: Left Bathroom Shower/Tub: Occupational psychologist: Standard Bathroom Accessibility: Yes   Home Equipment: Environmental consultant - 2 wheels;Cane - single point;Bedside commode          Prior Functioning/Environment Level of Independence: Independent with assistive device(s)        Comments: uses cane ocassionally        OT Problem List: Decreased strength;Decreased activity tolerance;Impaired balance (sitting and/or standing);Impaired vision/perception;Decreased coordination;Decreased safety awareness;Decreased knowledge of use of DME or AE;Impaired UE functional use      OT Treatment/Interventions: Self-care/ADL training;Therapeutic exercise;Neuromuscular education;DME and/or AE instruction;Therapeutic activities;Visual/perceptual remediation/compensation;Patient/family education;Balance training    OT Goals(Current goals can be found in the care plan section) Acute Rehab OT Goals Patient Stated Goal: to get better OT Goal Formulation: With patient Time For Goal Achievement: 01/23/17 Potential to Achieve Goals: Good  OT Frequency: Min 2X/week   Barriers to D/C:            Co-evaluation              AM-PAC PT "6 Clicks" Daily Activity     Outcome Measure Help from another person eating  meals?: None Help from another person taking care of personal grooming?: A Little Help from another person toileting, which includes using toliet, bedpan, or urinal?: A Little Help from another person bathing (including washing, rinsing, drying)?: A Little Help from another person to put on and taking off regular upper body clothing?: A Little Help from another person to put on and taking off regular lower body clothing?: A Little 6 Click Score: 19   End of Session Equipment Utilized During Treatment: Gait belt Nurse Communication: Mobility status  Activity Tolerance: Patient tolerated treatment well Patient left: in chair;with call bell/phone within reach;with chair alarm set;with family/visitor present  OT Visit Diagnosis: Unsteadiness on feet (R26.81);Other abnormalities of gait and mobility (R26.89);Repeated falls (R29.6);Muscle weakness (generalized) (M62.81);Low vision, both eyes (H54.2)                Time: 1451-1516 OT Time Calculation (min): 25 min Charges:  OT General Charges $OT Visit: 1 Procedure OT Evaluation $OT Eval Moderate Complexity: 1 Procedure OT Treatments $Self Care/Home Management : 8-22 mins G-Codes: OT G-codes **NOT FOR INPATIENT CLASS** Functional Assessment Tool Used: Clinical judgement Functional Limitation: Self care Self Care Current Status (D5329): At least 20 percent but less than 40 percent impaired, limited or restricted Self Care Goal Status (J2426): At least 1 percent but less than 20 percent impaired, limited or restricted   [facility], OT/L  [phone] 01/09/2017  Samreet [place],HILLARY 01/09/2017, 5:11 PM

## 2017-01-09 NOTE — Progress Notes (Signed)
Came back from MRI now.

## 2017-01-09 NOTE — Progress Notes (Signed)
EEG Completed; Results Pending  

## 2017-01-09 NOTE — Evaluation (Signed)
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 neuro work up underway. MRI mm focus of diffusion hyperintensity with intermediate ADC and posterior limb R internal capsule  Clinical Impression  Orders received for PT evaluation. Patient demonstrates deficits in functional mobility as indicated below. Will benefit from continued skilled PT to address deficits and maximize function. Will see as indicated and progress as tolerated.      Follow Up Recommendations Home health PT;Supervision/Assistance - 24 hour    Equipment Recommendations  Other (comment) (TBD)    Recommendations for Other Services       Precautions / Restrictions Precautions Precautions: Fall      Mobility  Bed Mobility Overal bed mobility: Needs Assistance Bed Mobility: Supine to Sit     Supine to sit: Min assist     General bed mobility comments: min assist to scoot to EOB, increased time and effort noted with multiple cues to process through to completion  Transfers Overall transfer level: Needs assistance Equipment used: 1 person hand held assist Transfers: Sit to/from Stand Sit to Stand: Min assist         General transfer comment: min assist for stability during power up, noted instability initially, HHA provided  Ambulation/Gait Ambulation/Gait assistance: Min assist Ambulation Distance (Feet): 120 Feet Assistive device: 1 person hand held assist Gait Pattern/deviations: Step-through pattern;Decreased stride length;Shuffle;Drifts right/left Gait velocity: decreased Gait velocity interpretation: Below normal speed for age/gender General Gait Details: patient very slow and guarded with gait, noted  instability and markedly limited ability to alter speed despite max cues. decreased foot clearance noted.  Stairs            Wheelchair Mobility    Modified Rankin (Stroke Patients Only) Modified Rankin (Stroke Patients Only) Pre-Morbid Rankin Score: Moderate disability Modified Rankin: Moderately severe disability     Balance Overall balance assessment: Needs assistance;History of Falls (history of falls "up the steps" per wife)   Sitting balance-Leahy Scale: Good Sitting balance - Comments: able to sit self supported without difficulty     Standing balance-Leahy Scale: Fair (poor to fair) Standing balance comment: increased sway noted in static standing, some difficulty accepting challange at times             High level balance activites: Direction changes;Turns;Head turns High Level Balance Comments: min assist with noted instbaility during higher level balance tasks. reports modest dizziness with positional head change             Pertinent Vitals/Pain Pain Assessment: No/denies pain    Home Living Family/patient expects to be discharged to:: Private residence Living Arrangements: Spouse/significant other Available Help at Discharge: Family;Available 24 hours/day Type of Home: House Home Access: Stairs to enter Entrance Stairs-Rails: None Entrance Stairs-Number of Steps: 1 Home Layout: Two level;Bed/bath upstairs Home Equipment: Walker - 2 wheels;Cane - single point;Bedside commode      Prior Function Level of Independence: Independent with assistive device(s)         Comments: uses cane ocassionally     Hand Dominance   Dominant Hand: Right    Extremity/Trunk Assessment   Upper Extremity Assessment Upper Extremity Assessment: RUE deficits/detail RUE Deficits / Details: Brunstrom level VI - isolated movements but impaired coordination and funcitonal  use R hand; apparent atrophy intrinsics; Wife reports decreased use of R hand compared to  baseline RUE Coordination: decreased fine motor    Lower Extremity Assessment Lower Extremity Assessment: Defer to PT evaluation RLE Deficits / Details: hx of R weakness at baseline    Cervical / Trunk Assessment Cervical / Trunk Assessment: Other exceptions (R bias)  Communication   Communication: No difficulties  Cognition Arousal/Alertness: Awake/alert Behavior During Therapy: Flat affect Overall Cognitive Status: Impaired/Different from baseline Area of Impairment: Problem solving;Attention;Memory;Awareness                   Current Attention Level: Selective Memory: Decreased short-term memory     Awareness: Emergent Problem Solving: Slow processing General Comments: wife at bedside reports patient a little "slower" than usual. Attention appears to impact postural control      General Comments      Exercises     Assessment/Plan    PT Assessment Patient needs continued PT services  PT Problem List Decreased strength;Decreased activity tolerance;Decreased balance;Decreased mobility;Decreased coordination;Decreased cognition       PT Treatment Interventions DME instruction;Gait training;Stair training;Functional mobility training;Therapeutic activities;Therapeutic exercise;Balance training;Neuromuscular re-education;Cognitive remediation;Patient/family education    PT Goals (Current goals can be found in the Care Plan section)  Acute Rehab PT Goals Patient Stated Goal: to get better PT Goal Formulation: With patient/family Time For Goal Achievement: 01/23/17 Potential to Achieve Goals: Good    Frequency Min 4X/week   Barriers to discharge        Co-evaluation               AM-PAC PT "6 Clicks" Daily Activity  Outcome Measure Difficulty turning over in bed (including adjusting bedclothes, sheets and blankets)?: Total Difficulty moving from lying on back to sitting on the side of the bed? : Total Difficulty sitting down on and standing up  from a chair with arms (e.g., wheelchair, bedside commode, etc,.)?: Total Help needed moving to and from a bed to chair (including a wheelchair)?: A Little Help needed walking in hospital room?: A Little Help needed climbing 3-5 steps with a railing? : A Little 6 Click Score: 12    End of Session Equipment Utilized During Treatment: Gait belt Activity Tolerance: Patient tolerated treatment well Patient left:  (with OT therapist) Nurse Communication: Mobility status PT Visit Diagnosis: Unsteadiness on feet (R26.81)    Time: 1245-8099 PT Time Calculation (min) (ACUTE ONLY): 18 min   Charges:   PT Evaluation $PT Eval Moderate Complexity: 1 Procedure     PT G Codes:        Alben Deeds, PT DPT  [phone]   Duncan Dull 01/09/2017, 6:07 PM

## 2017-01-09 NOTE — Progress Notes (Signed)
Arrived from Ed. Alert and oriented. Denies any pain. Oriented to room. Call light within reach.

## 2017-01-09 NOTE — Procedures (Signed)
ELECTROENCEPHALOGRAM REPORT  Date of Study: 01/09/2017  Patient's Name: Cody Silva MRN: [id number] Date of Birth: [birth year]  Referring Provider: Dr. Roland Rack  Clinical History: This is a 71 year old man with recurrent episodes of loss of consciousness, right-sided twitching.  Medications: valproate (DEPACON) 500 mg in dextrose 5 % 50 mL IVPB  acetaminophen (TYLENOL) tablet 650 mg  albuterol (PROVENTIL) (2.5 MG/3ML) 0.083% nebulizer solution 2.5 mg  atorvastatin (LIPITOR) tablet 20 mg  carvedilol (COREG) tablet 25 mg  clopidogrel (PLAVIX) tablet 75 mg  enoxaparin (LOVENOX) injection 40 mg  fluticasone (FLONASE) 50 MCG/ACT nasal spray 2 spray  gabapentin (NEURONTIN) capsule 300 mg  insulin aspart (novoLOG) injection 0-9 Units  insulin glargine (LANTUS) injection 10 Units  latanoprost (XALATAN) 0.005 % ophthalmic solution 1 drop  loratadine (CLARITIN) tablet 10 mg  sertraline (ZOLOFT) tablet 100 mg  tamsulosin (FLOMAX) capsule 0.8 mg   Technical Summary: A multichannel digital EEG recording measured by the international 10-20 system with electrodes applied with paste and impedances below 5000 ohms performed as portable with EKG monitoring in a predominantly drowsy and asleep patient.  Hyperventilation and photic stimulation were not performed.  The digital EEG was referentially recorded, reformatted, and digitally filtered in a variety of bipolar and referential montages for optimal display.   Description: The patient is predominantly drowsy and asleep during the recording.  During brief period of wakefulness, there is a symmetric, low to medium voltage 8 Hz posterior dominant rhythm that poorly attenuates with eye opening and eye closure. This is admixed with a small amount of diffuse 5-6 Hz thetaslowing of the waking background.  During drowsiness and sleep, there is an increase in theta slowing of the background.  Vertex waves and symmetric sleep spindles were seen.   Hyperventilation and photic stimulation were not performed.  There were no epileptiform discharges or electrographic seizures seen.    EKG lead showed sinus bradycardia at 60 bpm.  Impression: This predominantly drowsy and asleep EEG is abnormal due to mild diffuse slowing of the waking background.  Clinical Correlation of the above findings indicates diffuse cerebral dysfunction that is non-specific in etiology and can be seen with hypoxic/ischemic injury, toxic/metabolic encephalopathies, neurodegenerative disorders, medication effect, or due to excessive drowsiness.  The absence of epileptiform discharges does not rule out a clinical diagnosis of epilepsy.  Clinical correlation is advised.   Ellouise Newer, M.D.

## 2017-01-09 NOTE — ED Notes (Signed)
Valproate level not drawn  He does not take it and his drip that was ordered was already started

## 2017-01-09 NOTE — Progress Notes (Signed)
   Subjective: Mr. Benally is improved from presentation but still appears very tired on exam today. He is arousable to voice and conversational. He was seen shortly after after eating about half of his lunch. His wife was present at the bedside. She feels that his speech is back to usual but he is not quite this tired at home. He has not felt any recurrent weakness or confusion during the day.  Objective:  Vital signs in last 24 hours: Vitals:   01/09/17 0639 01/09/17 0810 01/09/17 1124 01/09/17 1330  BP: (!) 154/77 (!) 160/74 (!) 181/78 (!) 144/66  Pulse: (!) 59 (!) 58 (!) 58 (!) 55  Resp: 14 16  16   Temp: 99 F (37.2 C)  97.5 F (36.4 C) 97.9 F (36.6 C)  TempSrc: Oral  Oral Oral  SpO2: 100% 100% 96% 97%  Weight:      Height:       GENERAL- tired, co-operative, NAD HEENT- PERRL, EOMI, oral mucosa appears moist, no carotid bruit CARDIAC- RRR, no murmurs, rubs or gallops NEURO- cranial nerves are grossly intact, 5 out of 5 strength in all extremities, no pronator drift, finger-nose-finger dysmetria worse on the left, normal rapid alternating movements and heel-to-shin EXTREMITIES- symmetric, no pedal edema. SKIN- Warm, dry  Assessment/Plan: #Subacute R internal capsule infarct The reported R facial droop and slurred speech are resolved today and he has 5/5 RUE strength. The pattern would fit with his chronic uncontrolled HTN, T2DM, HLD and MRI is suggestive of chronic microhemorrhages related to hypertension. These could also be acting as eliptogenic foci leading to seizure before this admission. We will continue his DAPT and statin as before. Stroke team to see patient in consultation. PT and OT evaluations are ordered and pending.  #Possible Seizure  Per wife he was not taking depakote and refused this lab in the ED. It is clearly documented PTA after ED visit last week so there seems to be a missed communication and we will need to clarify this again. He had no recurrent seizure  on EEG obtained today. We will continue precautions at this time. Depakote q8hrs and checking level in AM.  #Hypertension  BP is 200/90s, normalizing on day on his home Coreg and his clonidine patch. We are holding chlorthalidone and losartan at this time with mildly elevated BP and subacute infarct on MRI head.  Diabetes  CBGs in mid 100s on half home basal dose and SSI. No changes at this time.  Dispo: Anticipated discharge in approximately 1-3 day(s).   Collier Salina, MD PGY-II Internal Medicine Resident Pager# [phone] 01/09/2017, 4:23 PM

## 2017-01-09 NOTE — Progress Notes (Signed)
Going for MRI now.

## 2017-01-09 NOTE — ED Notes (Signed)
Report called to rn

## 2017-01-09 NOTE — Progress Notes (Addendum)
STROKE TEAM PROGRESS NOTE   HISTORY OF PRESENT ILLNESS (per record) Cody Silva is a 71 y.o. male with a history of multiple episodes, recent of loss of consciousness. With his most recent episode, there is description of his right-sided twitching concerning for possible partial seizure and he was started on Depakote. He states that he has been taking this medication.  Tonight, he was last seen well around 7 PM 01/08/2017, and following this his daughter came home and found him around 7:50 PM and he was unable to speak to her. He states that he remembers his daughter finding him, and that he had difficulty finding his words. He does not think he had any episode of loss of consciousness this evening. Patient was not administered IV t-PA secondary to rapidly improving symptoms. He was admitted for further evaluation and treatment.   SUBJECTIVE (INTERVAL HISTORY) Wife and son at bedside. He is lethargic but able to participate conversation and exam well. Recounted HPI with wife and pt. MRI showed right periventricular punctate infarct which can not explain pt symptoms. Now on depakote.    OBJECTIVE Temp:  [98.1 F (36.7 C)-99 F (37.2 C)] 99 F (37.2 C) (05/08 0639) Pulse Rate:  [55-69] 58 (05/08 0810) Cardiac Rhythm: Normal sinus rhythm (05/08 0218) Resp:  [10-21] 16 (05/08 0810) BP: (138-211)/(63-100) 160/74 (05/08 0810) SpO2:  [100 %] 100 % (05/08 0810) Weight:  [87.2 kg (192 lb 4.8 oz)-88.1 kg (194 lb 3.2 oz)] 87.2 kg (192 lb 4.8 oz) (05/08 0200)  CBC:  Recent Labs Lab 01/02/17 1826 01/08/17 2109 01/08/17 2147  WBC 5.0  --  5.3  NEUTROABS  --   --  4.1  HGB 11.0* 11.6* 10.9*  HCT 32.6* 34.0* 31.8*  MCV 88.3  --  87.6  PLT 181  --  417    Basic Metabolic Panel:  Recent Labs Lab 01/02/17 1826 01/08/17 2101 01/08/17 2109  NA 135 139 140  K 3.1* 4.0 4.0  CL 102 104 100*  CO2 27 24  --   GLUCOSE 150* 224* 219*  BUN 22* 25* 29*  CREATININE 2.23* 2.33* 2.30*  CALCIUM  8.8* 9.4  --     Lipid Panel:    Component Value Date/Time   CHOL 215 (H) 09/15/2015 0523   TRIG 99 09/15/2015 0523   HDL 58 09/15/2015 0523   CHOLHDL 3.7 09/15/2015 0523   VLDL 20 09/15/2015 0523   LDLCALC 137 (H) 09/15/2015 0523   HgbA1c:  Lab Results  Component Value Date   HGBA1C 7.1 (H) 09/10/2016   Urine Drug Screen:    Component Value Date/Time   LABOPIA NONE DETECTED 09/10/2016 1415   COCAINSCRNUR NONE DETECTED 09/10/2016 1415   LABBENZ NONE DETECTED 09/10/2016 1415   AMPHETMU NONE DETECTED 09/10/2016 1415   THCU NONE DETECTED 09/10/2016 1415   LABBARB NONE DETECTED 09/10/2016 1415    Alcohol Level     Component Value Date/Time   ETH <5 01/08/2017 2101    IMAGING I have personally reviewed the radiological images below and agree with the radiology interpretations.  Ct Head Code Stroke W/o Cm 01/08/2017 1. No acute intracranial abnormality. Unchanged appearance of chronic bilateral lenticulocapsular lacunar infarcts. 2. ASPECTS is 10.   Mr Brain Wo Contrast 01/09/2017 1. 4 mm focus of diffusion hyperintensity with intermediate ADC at junction of right posterior limb of internal capsule and periventricular white probably representing a subacute infarct. 2. Scattered punctate foci of old microhemorrhage most compatible with chronic hypertension. 3. Otherwise  no evidence for acute infarct, intracranial hemorrhage, or focal mass effect. 4. Mild chronic microvascular ischemic changes and mild parenchymal volume loss of the brain. 5. Trace left mastoid air cell effusion.   10/08/16 MRA head 1.   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 2.   No hemodynamically significant stenosis is noted. 3.   No aneurysms.  10/08/16 MRA neck This is a normal noncontrasted MR angiogram of the neck arteries.  EEG This predominantly drowsy and asleep EEG is abnormal  due to mild diffuse slowing of the waking background. Clinical Correlation of the above findings indicates diffuse cerebral dysfunction that is non-specific in etiology and can be seen with hypoxic/ischemic injury, toxic/metabolic encephalopathies, neurodegenerative disorders, medication effect, or due to excessive drowsiness.  The absence of epileptiform discharges does not rule out a clinical diagnosis of epilepsy.  Clinical correlation is advised.  TTE pending   PHYSICAL EXAM  Temp:  [97.5 F (36.4 C)-99 F (37.2 C)] 97.9 F (36.6 C) (05/08 1330) Pulse Rate:  [55-69] 55 (05/08 1330) Resp:  [10-21] 16 (05/08 1330) BP: (138-211)/(63-100) 144/66 (05/08 1330) SpO2:  [96 %-100 %] 97 % (05/08 1330) Weight:  [192 lb 4.8 oz (87.2 kg)-194 lb 3.2 oz (88.1 kg)] 192 lb 4.8 oz (87.2 kg) (05/08 0200)  General - Well nourished, well developed, mildly lethargic.  Ophthalmologic - Fundi not visualized due to noncooperation.  Cardiovascular - Regular rate and rhythm.  Mental Status -  Level of arousal and orientation to time, place, and person were intact. Language including expression, naming, repetition, comprehension was assessed and found intact. Fund of Knowledge was assessed and was intact.  Cranial Nerves II - XII - II - Visual acuity OS can see hand waving, OD can see finger counting but decreased to hand waving at left lower quadrant. III, IV, VI - Extraocular movements intact. V - Facial sensation intact bilaterally. VII - mild right nasolabial fold flattening. VIII - Hearing & vestibular intact bilaterally. X - Palate elevates symmetrically. XI - Chin turning & shoulder shrug intact bilaterally. XII - Tongue protrusion intact.  Motor Strength - The patient's strength was normal in all extremities and pronator drift was absent.  Bulk was normal and fasciculations were absent.   Motor Tone - Muscle tone was assessed at the neck and appendages and was normal.  Reflexes - The  patient's reflexes were 1+ in all extremities and he had no pathological reflexes.  Sensory - Light touch, temperature/pinprick were assessed and were symmetrical.    Coordination - The patient had normal movements in the hands with no ataxia or dysmetria.  Tremor was absent.  Gait and Station - deferred.   ASSESSMENT/PLAN Cody Silva is a 71 y.o. male with history of multiple episodes of loss of consciousness with R sided twitching, recently started on Depakote presenting with inability to speak. He did not receive IV t-PA due to rapidly improving symptoms.   Episodes of LOC  Multiple episodes since 07/2016  Last episode last Tuesday (one week ago) with staring spell, drooling, and followed by LOC  Followed with Dr. Leonie Man in clinic at [facility], was prescribed depakote but never started  Repeated EEG showed no seizure activities   Recent MRI and MRA head and neck not revealing except mild stable BA stenosis  This admission repeat EEG negative.   BP 120s during episodes  Restarted depakote 500mg  Q8h this  admission  Check depakote level in am  EEG mild diffuse slow this admission  Pt has follow up appointment 01/11/17 at [facility] with Dr. 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 This finding can not explain pt presenting symptoms.   Resultant - transient difficulty speech, right facial and arm weakness  Code Stroke CT no acute stroke. Old B lenticulocapsular lacunes. Aspects 10.    MRI  R PLIC subacute infarct. Scattered microhemorrhages. small vessel disease  MRA head and neck in 10/2016 mild stable BA stenosis.   2D Echo  pending  EEG mild diffuse slow  Loop recorder interrogation - no afib  LDL pending   HgbA1c pending  Lovenox 40 mg sq daily for VTE prophylaxis  Diet Carb Modified Fluid consistency: Thin; Room service appropriate? Yes  clopidogrel 75 mg daily prior to admission, now on aspirin 325  mg daily and clopidogrel 75 mg daily. Continue DAPT for 3 months and the plavix alone.   Patient counseled to be compliant with his antithrombotic medications  Ongoing aggressive stroke risk factor management  Therapy recommendations:  pending   Disposition:  pending   Hx stroke/TIA  09/2016 - multiple episodes syncope/LOC. MRI no acute stroke. New R IC lacune since previous MRI. EEG neg  07/2016 - LOC episodes - EEG neg, CT neg, loop recorder neg  12/2015 - loop insertion  09/2015 - syncope, doubt TIA (transient episode of slurred speech, facial droop and blurred vision) - MRI / MRA / CUS/TTE neg - A1C 7.9 LDL 137  02/2013 - B ACA infarcts - CUS/TTE/EEG neg - A1C 7.5  Hx of LGIB s/p polypectomy  10/2014 received 1 unit PRBC  Thought due to polypectomy and GI work up not revealing  Resumed plavix in 7 days  No more GIB since  Hypertension  Elevated up to 211/85  160/74 this am  Recommend long term BP goal normotensive   Hyperlipidemia  Home meds:  lipitor 80  lipitor decreased to 20 in hospital  LDL pending, goal < 70  Continue statin at discharge  Diabetes type II Diabetic neuropathy Diabetic retinopathy   HgbA1c pending, goal < 7.0  CBG under control  SSI  Other Stroke Risk Factors  Advanced age  ETOH use, advised to drink no more than 2 drink(s) a day  Family hx stroke (brother)  Obstructive sleep apnea, supposed to be on CPAP at home  Other Active Problems  PTSD  Seasonal allergies  Glaucoma   BPH  Follows with Dr. Leonie Man at [facility]. Next appointment 01/11/17  Hospital day # 0  Rosalin Hawking, MD PhD Stroke Neurology 01/09/2017 4:39 PM    To contact Stroke Continuity provider, please refer to [email or web address] After hours, contact General Neurology

## 2017-01-09 NOTE — ED Notes (Signed)
No need to collect blood for valp since pt receiving  at this time. Spoke with RN

## 2017-01-09 NOTE — ED Notes (Signed)
The pt reports that for the past 2 weeks he has had to take more time swallowing but did not have any problems swallowing some water

## 2017-01-09 NOTE — Progress Notes (Signed)
Mr. Tignor is a 71 yo male from home with acute onset generalized weakness and difficulty finding his words when daughter came home. Hx of stroke with Right side deficits  NIH 3, CBG 219, LKW 1900, found by daughter 39. Symptoms improving, MRI to follow, triad to admit.

## 2017-01-10 ENCOUNTER — Observation Stay (HOSPITAL_BASED_OUTPATIENT_CLINIC_OR_DEPARTMENT_OTHER): Payer: Medicare Other

## 2017-01-10 DIAGNOSIS — Z7982 Long term (current) use of aspirin: Secondary | ICD-10-CM | POA: Diagnosis not present

## 2017-01-10 DIAGNOSIS — R569 Unspecified convulsions: Secondary | ICD-10-CM | POA: Diagnosis not present

## 2017-01-10 DIAGNOSIS — E784 Other hyperlipidemia: Secondary | ICD-10-CM | POA: Diagnosis not present

## 2017-01-10 DIAGNOSIS — Z7902 Long term (current) use of antithrombotics/antiplatelets: Secondary | ICD-10-CM | POA: Diagnosis not present

## 2017-01-10 DIAGNOSIS — R29810 Facial weakness: Secondary | ICD-10-CM | POA: Diagnosis not present

## 2017-01-10 DIAGNOSIS — G459 Transient cerebral ischemic attack, unspecified: Secondary | ICD-10-CM

## 2017-01-10 DIAGNOSIS — Z888 Allergy status to other drugs, medicaments and biological substances status: Secondary | ICD-10-CM | POA: Diagnosis not present

## 2017-01-10 DIAGNOSIS — I10 Essential (primary) hypertension: Secondary | ICD-10-CM

## 2017-01-10 DIAGNOSIS — I360 Nonrheumatic tricuspid (valve) stenosis: Secondary | ICD-10-CM

## 2017-01-10 DIAGNOSIS — E785 Hyperlipidemia, unspecified: Secondary | ICD-10-CM | POA: Diagnosis not present

## 2017-01-10 DIAGNOSIS — R29818 Other symptoms and signs involving the nervous system: Secondary | ICD-10-CM | POA: Diagnosis not present

## 2017-01-10 DIAGNOSIS — I6330 Cerebral infarction due to thrombosis of unspecified cerebral artery: Secondary | ICD-10-CM | POA: Diagnosis not present

## 2017-01-10 DIAGNOSIS — G9340 Encephalopathy, unspecified: Secondary | ICD-10-CM | POA: Diagnosis not present

## 2017-01-10 LAB — LIPID PANEL
CHOL/HDL RATIO: 2 ratio
Cholesterol: 105 mg/dL (ref 0–200)
HDL: 52 mg/dL (ref >40)
LDL CALC: 45 mg/dL (ref 0–99)
TRIGLYCERIDES: 40 mg/dL (ref <150)
VLDL: 8 mg/dL (ref 0–40)

## 2017-01-10 LAB — VALPROIC ACID LEVEL: Valproic Acid Lvl: 50 ug/mL (ref 50.0–100.0)

## 2017-01-10 LAB — BASIC METABOLIC PANEL
Anion gap: 11 (ref 5–15)
BUN: 27 mg/dL — AB (ref 6–20)
CHLORIDE: 104 mmol/L (ref 101–111)
CO2: 26 mmol/L (ref 22–32)
CREATININE: 2.16 mg/dL — AB (ref 0.61–1.24)
Calcium: 9 mg/dL (ref 8.9–10.3)
GFR calc non Af Amer: 29 mL/min — ABNORMAL LOW (ref >60)
GFR, EST AFRICAN AMERICAN: 34 mL/min — AB (ref >60)
Glucose, Bld: 88 mg/dL (ref 65–99)
Potassium: 3 mmol/L — ABNORMAL LOW (ref 3.5–5.1)
Sodium: 141 mmol/L (ref 135–145)

## 2017-01-10 LAB — GLUCOSE, CAPILLARY
GLUCOSE-CAPILLARY: 107 mg/dL — AB (ref 65–99)
Glucose-Capillary: 79 mg/dL (ref 65–99)

## 2017-01-10 LAB — VAS US CAROTID
LCCADDIAS: -11 cm/s
LCCAPDIAS: 9 cm/s
LCCAPSYS: 66 cm/s
LEFT ECA DIAS: -8 cm/s
LEFT VERTEBRAL DIAS: -11 cm/s
LICADDIAS: -17 cm/s
LICADSYS: -75 cm/s
LICAPSYS: 58 cm/s
Left CCA dist sys: -73 cm/s
Left ICA prox dias: 8 cm/s
RIGHT ECA DIAS: -5 cm/s
RIGHT VERTEBRAL DIAS: -10 cm/s
Right CCA prox dias: -6 cm/s
Right CCA prox sys: -86 cm/s
Right cca dist sys: -54 cm/s

## 2017-01-10 LAB — ECHOCARDIOGRAM COMPLETE
HEIGHTINCHES: 73 in
Weight: 3076.8 oz

## 2017-01-10 LAB — MAGNESIUM: Magnesium: 1.7 mg/dL (ref 1.7–2.4)

## 2017-01-10 MED ORDER — ASPIRIN 81 MG PO TBEC: 81.0000 mg | DELAYED_RELEASE_TABLET | Freq: Every day | ORAL | 0 refills | Status: DC

## 2017-01-10 MED ORDER — ASPIRIN EC 81 MG PO TBEC
81.0000 mg | DELAYED_RELEASE_TABLET | Freq: Every day | ORAL | Status: DC
  Administered 2017-01-10: 81 mg via ORAL
  Filled 2017-01-10: qty 1

## 2017-01-10 MED ORDER — MAGNESIUM SULFATE 2 GM/50ML IV SOLN
2.0000 g | Freq: Once | INTRAVENOUS | Status: AC
  Administered 2017-01-10: 2 g via INTRAVENOUS
  Filled 2017-01-10: qty 50

## 2017-01-10 MED ORDER — POTASSIUM CHLORIDE CRYS ER 20 MEQ PO TBCR
40.0000 meq | EXTENDED_RELEASE_TABLET | Freq: Two times a day (BID) | ORAL | Status: DC
  Administered 2017-01-10: 40 meq via ORAL
  Filled 2017-01-10: qty 2

## 2017-01-10 NOTE — Care Management Obs Status (Signed)
[place] NOTIFICATION   Patient Details  Name: Cody Silva MRN: [id number] Date of Birth: [birth year]   Medicare Observation Status Notification Given:  Yes    Pollie Friar, RN 01/10/2017, 12:27 PM

## 2017-01-10 NOTE — Progress Notes (Signed)
PT Cancellation Note  Patient Details Name: Cody Silva MRN: [id number] DOB: 1945-10-03   Cancelled Treatment:    Reason Eval/Treat Not Completed: Patient at procedure or test/unavailable. Pt off of the floor for a procedure. PT will continue to f/u with pt as available.   [place] 01/10/2017, 3:05 PM

## 2017-01-10 NOTE — Discharge Summary (Signed)
Name: Cody Silva MRN: [id number] DOB: 03-14-1946 71 y.o. PCP: [facility], [facility]  Date of Admission: 01/08/2017  8:53 PM Date of Discharge: 01/10/2017 Attending Physician: Vevelyn Pat, MD  Discharge Diagnosis: 1. Transient Ischemic Attack vs Seizure 2. Subacute right internal capsule infarction, Right 3. HTN 4. HLD  Active Problems:   Altered mental status   Facial droop   Cerebral thrombosis with cerebral infarction   Seizures ([place])   Other hyperlipidemia   Discharge Medications: Allergies as of 01/10/2017      Reactions   Metformin And Related Diarrhea   Tape Rash      Medication List    TAKE these medications   acetaminophen 500 MG tablet Commonly known as:  TYLENOL Take 1 tablet (500 mg total) by mouth every 6 (six) hours as needed for mild pain, moderate pain, fever or headache.   albuterol 108 (90 Base) MCG/ACT inhaler Commonly known as:  PROVENTIL HFA;VENTOLIN HFA Inhale 2 puffs into the lungs every 6 (six) hours as needed for wheezing.   aspirin 81 MG EC tablet Take 1 tablet (81 mg total) by mouth daily. Start taking on:  01/11/2017   atorvastatin 20 MG tablet Commonly known as:  LIPITOR Take 1 tablet (20 mg total) by mouth daily at 6 PM. What changed:  Another medication with the same name was removed. Continue taking this medication, and follow the directions you see here.   carvedilol 25 MG tablet Commonly known as:  COREG Take 25 mg by mouth 2 (two) times daily. Reported on 09/14/2015   cetirizine 10 MG tablet Commonly known as:  ZYRTEC Take 10 mg by mouth at bedtime as needed for allergies.   chlorthalidone 25 MG tablet Commonly known as:  HYGROTON Take 25 mg by mouth daily.   cloNIDine 0.2 mg/24hr patch Commonly known as:  CATAPRES - Dosed in mg/24 hr Place 0.2 mg onto the skin once a week. Notes to patient:  Continue with you're home schedule.    clopidogrel 75 MG tablet Commonly known as:  PLAVIX Take 1 tablet (75 mg  total) by mouth at bedtime. What changed:  when to take this   divalproex 500 MG DR tablet Commonly known as:  DEPAKOTE Take 1 tablet (500 mg total) by mouth 3 (three) times daily.   eucerin cream Apply 1 application topically daily. FEET AND LEGS   feeding supplement (ENSURE ENLIVE) Liqd Take 237 mLs by mouth 2 (two) times daily after a meal. What changed:  when to take this   fluocinonide cream 0.05 % Commonly known as:  LIDEX Apply 1 application topically daily. Notes to patient:  Continue with you're home schedule    fluticasone 50 MCG/ACT nasal spray Commonly known as:  FLONASE Place 2 sprays into both nostrils daily as needed for allergies.   gabapentin 300 MG capsule Commonly known as:  NEURONTIN Take 300 mg by mouth at bedtime.   GNP GINGKO BILOBA EXTRACT PO Take 1 capsule by mouth 2 (two) times a week. Notes to patient:  Continue with home schedule   insulin aspart 100 UNIT/ML injection Commonly known as:  novoLOG Inject 5-15 Units into the skin 3 (three) times daily with meals. Per sliding scale   insulin glargine 100 UNIT/ML injection Commonly known as:  LANTUS Inject 20 Units into the skin at bedtime.   latanoprost 0.005 % ophthalmic solution Commonly known as:  XALATAN Place 1 drop into both eyes at bedtime.   losartan 100 MG tablet Commonly known  as:  COZAAR Take 100 mg by mouth daily.   MULTIVITAMIN PO Take 1 tablet by mouth daily.   polyvinyl alcohol 1.4 % ophthalmic solution Commonly known as:  LIQUIFILM TEARS Place 1 drop into both eyes 5 (five) times daily as needed (for dry eyes).   potassium chloride 10 MEQ tablet Commonly known as:  K-DUR,KLOR-CON Take 10 mEq by mouth 2 (two) times daily.   sertraline 100 MG tablet Commonly known as:  ZOLOFT Take 100 mg by mouth daily.   tamsulosin 0.4 MG Caps capsule Commonly known as:  FLOMAX Take 0.8 mg by mouth at bedtime.       Disposition and follow-up:   [name] was  discharged from [facility] in stable condition.  At the hospital follow up visit please address:  1.  Seizures/LOC/neurologic symptoms: Inquire about further episodes. Please ensure patient is taking his DEPAKOTE as prescribed and emphasize the importance of compliance. Please ensure he has made all recommended medication changes as well.   HTN: He will need excellent BP control as part of an effort to reduce risk of further events. Please ensure medication compliance and adequate BP control.   2.  Labs / imaging needed at time of follow-up: Depakote level to ensure compliance  3.  Pending labs/ test needing follow-up: HbA1c  Follow-up Appointments: Follow-up Information    Garvin Fila, MD. Go on 01/11/2017.   Specialties:  Neurology, Radiology Contact information: [address] [place] [address] [facility], [place] Schedule an appointment as soon as possible for a visit in 1 week(s).   Why:  Please make an appointment with your [facility] primary care physician within 1 week of discharge.  Contact information: [place] [address] [phone]           Hospital Course by problem list: Active Problems:   Altered mental status   Facial droop   Cerebral thrombosis with cerebral infarction   Seizures ([place])   Other hyperlipidemia   1. Transient Ischemic Attack, Subacute right PLIC infarction 71 y/o M presented 5/7 with sudden onset slurred speech, difficulty finding words, right-sided weakness and right facial droop lasting approximately 1 hr. CT head without acute abnormality and showed unchanged chronic BL lenticulocapsular lacunar infarcts. MRI showed 21mm subacute infarct of right PLIC with scattered punctate foci of old microhemorrhages most compatible with chronic hypertension however areas do not align with patients presentation. EEG with non-specific diffuse slowing however was  without epileptiform waves. ECHO without etiology and carotid duplex with <39% stenosis BL. Neurology recommended 3 months of DAPT + close outpatient follow-up and patient has appointment with his primary neurologist the day following discharge.   2. Seizures Recent onset of multiple episodes of LOC, twitching followed by a post-ictal period. These are usually preceded by standing and he has been admitted earlier this year for evaluation at which time he was found to be orthostatic. Due to frequency and presentation, he was prescribed anti-epileptics in [place] however did not start these. He was loaded with Depakote and reached therapeutic level (50) at time of discharge. He was encouraged strongly to comply with Depakote TID and has follow-up with his neurologist the day following discharge. He did not have any further events during admission.   3. Hyperlipidemia Patient has been on Atorvastatin 80mg  for several years. Lipid panel during admission with excellent control (Total cholesterol 105, HDL 52, LDL 45 and TG  40). He was subsequently discharged with Atorvastatin 20 mg at recommendation of neurology.   4. Hypertension Permissive hypertension was allowed until acute infarction was ruled out. He was restarted several of his home BP medications during admission however all were resumed on discharge. He will require excellent BP control now.   Discharge Vitals:   BP (!) 160/78 (BP Location: Left Arm)   Pulse (!) 56   Temp 97.9 F (36.6 C) (Oral)   Resp 18   Ht 6\' 1"  (1.854 m)   Wt 192 lb 4.8 oz (87.2 kg)   SpO2 99%   BMI 25.37 kg/m   Pertinent Labs, Studies, and Procedures:  CT Head Code Stroke 5/7: No acute intracranial abnormality. Unchanged BL lenticulocapsular lacunar infarctions MR Brain 5/8: 62mm subacute infarction of right posterior limb of internal capsule. Scattered punctate foci of old microhemorrhages suggesting chronic hypertension. No acute infarction, intracranial  hemorrhage or mass effect.  EEG: Mild non-specific diffuse slowing. No epileptiform waves ECHO: LVEF 75-17%, grade 1 diastolic dysfunction. Severe LVH Carotid dopplers: <39% stenosis BL carotids MRA 10/2016: Mild stenosis of basilar artery, posterior cerebral arteries and superior cerebellar arteries.  Lipid panel: Total cholesterol 105, LDL 45.   Discharge Instructions: Mr. [place], you were admitted after a brief episode of slurred speech, difficulty finding words, right facial droop and right sided weakness. Your symptoms quickly resolved and imaging was without an acute infarction. You had several tests to help optimize your medical treatment in attempts to prevent further TIAs/strokes and some changes were made to your medications. Please take all medications as directed. Please follow-up with both neurology and your primary care provider.   SignedEinar Gip, DO 01/10/2017, 7:04 PM   Pager: [phone]

## 2017-01-10 NOTE — Progress Notes (Signed)
Subjective: Cody Silva was sitting up and eating breakfast this morning. He said he was feeling well this morning, had not had any episodes of loss of consciousness and and was able to answer questions with no difficult finding words.   Objective: Vital signs in last 24 hours: Vitals:   01/09/17 2103 01/10/17 0106 01/10/17 0446 01/10/17 0920  BP: (!) 159/68 (!) 160/78 (!) 169/69 (!) 153/82  Pulse: (!) 49 (!) 50 (!) 55 64  Resp: 18 18 18 16   Temp: 97.6 F (36.4 C) 97.6 F (36.4 C) 98.6 F (37 C) 98.2 F (36.8 C)  TempSrc: Oral Oral Oral Oral  SpO2: 100% 100% 99% 99%  Weight:      Height:       Physical Exam: General: NAD, sitting up in bed eating breakfast, able to converse without pauses or alogia  CV: RRR, no murmurs, Rubs or Gallops  Neuro: CN II-XII grossly intact, heel to shin normal, 5 of 5 strength in all extremities, finger-to-nose dysmetria bilateral, strength 5 of 5 in bilateral upper and lower extremities   Assessment/Plan: Active Problems:   Altered mental status   Facial droop   Cerebral thrombosis with cerebral infarction   Seizures ([facility])   Other hyperlipidemia   Altered Mental Status/ History of subacute right internal capsule infarct: Cody Silva has not shown any significant neurologic deficits over the last two days. Physical Exam findings along with an MRI that showed foci representing a subacute infarct and old foci of microhemorrhage but not an acute infarct suggest that an acute infarct is not the reason for his originally reported inability to find words and right sided facial droop. A Transient Ischemic Attack could explain the above findings. In addition to investigating for carotid artery stenosis in the setting of a potential TIA, it is important to maintain proper prophylaxis to decrease risk of recurrent stroke and cerebrovascular events.   --Carotid Ultrasound with doppler to assess for significant carotid artery stenosis  --Continue atorvastatin  20mg  qd --Continue clopiogrel 75mg  qd --continue enoxaparin 40mg  qd  Possible Seizure: An alternative explanation for Cody Silva' change in mental status on presentation could be that the previous internal capsule infarcts are acting as foci for seizures. Non-compliance with Depakote for seizure control at the time of presentation supports this. An EEG performed yesterday was negative for continued seizure activity. However, this does not rule out prior occurrence of seizures. With the patient's history of seizure-like epsiodes and and plausible seizure foci, prophylaxis should be continued, and the importance of adherence to medications as prescribed should be clearly expressed.  --Continue Depacote 500 mg q8   Hypertension: BP was elevated in the 180s today. With the risk of a subacute stroke, it is reasonable to not attempt to aggressively lower BP at the time. However, follow up to lower BP will be crucial to preventing recurrence of lacunar strokes and cerebral microhemorrages, which have been shown to directly relate to elevated BP. --Carvedilol 25 mg 2qd --consider restarting chlorthalidone and losartan on d/c if BP elevated and suspicion for acute stroke remains low  --schedule outpatient follow-up with PCP to address elevated BP  Diabetes:  Blood glucose at 88 today on insulin aspart 3qd and insulin glargine at bedtime. No changes, restart home insulin on discharge.   Dispo: Anticipate Discharge in 1-2 days. Discharge to home with home health and PT supervision and assistance   This is a Careers information officer Note.  The care of the patient was discussed with Dr. Danford Bad  and the assessment and plan formulated with their assistance.  Please see their attached note for official documentation of the daily encounter.  LOS: 2 days   Cyndie Mull, Medical Student 01/10/2017, 10:05 AM

## 2017-01-10 NOTE — Progress Notes (Signed)
Occupational Therapy Treatment Patient Details Name: Cody Silva MRN: [id number] DOB: Aug 15, 1946 Today's Date: 01/10/2017    History of present illness 71yo gentleman with PMH of CVA with some mild right hemiparesis remaining, TIA, DM2, HTN, CKD, HLD, OSA, PTSD presents with new onset episodes of loss of consciousness with episode of confusion. neuro work up underway. MRI mm focus of diffusion hyperintensity with intermediate ADC and posterior limb R internal capsule   OT comments  Pt progressing towards goals, completing seated and standing ADLs this session. Pt completing standing ADLs with increased stability noted. Educated Pt on theraputty hand strengthening exercises with handout provided, with Pt verbalizing/demonstrating understanding. Educated Pt on strategies during ADL task completion to increase safety after return home. Questions answered throughout. DC plan remains appropriate. Pt will continue to benefit from OT services to increase safety and independence with ADLs and functional mobility. Continue per POC.   Follow Up Recommendations  Home health OT;Supervision/Assistance - 24 hour    Equipment Recommendations  None recommended by OT          Precautions / Restrictions Precautions Precautions: Fall Restrictions Weight Bearing Restrictions: No       Mobility Bed Mobility Overal bed mobility: Needs Assistance Bed Mobility: Supine to Sit;Sit to Supine     Supine to sit: Supervision Sit to supine: Supervision      Transfers Overall transfer level: Needs assistance Equipment used: None Transfers: Sit to/from Stand Sit to Stand: Min guard         General transfer comment: for safety, increased time    Balance Overall balance assessment: Needs assistance;History of Falls Sitting-balance support: Feet supported Sitting balance-Leahy Scale: Good Sitting balance - Comments: able to sit self supported without difficulty   Standing balance support:  Single extremity supported;During functional activity Standing balance-Leahy Scale: Fair                             ADL either performed or assessed with clinical judgement   ADL Overall ADL's : Needs assistance/impaired     Grooming: Oral care;Min guard;Standing           Upper Body Dressing : Min guard;Sitting   Lower Body Dressing: Min guard;Sit to/from stand Lower Body Dressing Details (indicate cue type and reason): donning socks, donning pants             Functional mobility during ADLs: Min guard;Cueing for safety General ADL Comments: Pt completed room level functional mobility, LB dressing, standing grooming ADLs during session, discussed compensatory techniques for completing ADLs to increase safety at home during task completion                       Cognition Arousal/Alertness: Awake/alert Behavior During Therapy: Flat affect Overall Cognitive Status: Impaired/Different from baseline Area of Impairment: Problem solving;Attention;Memory;Awareness                   Current Attention Level: Selective Memory: Decreased short-term memory     Awareness: Emergent Problem Solving: Slow processing          Exercises Exercises: Hand exercises Hand Exercises Digit Composite Flexion: AROM;Both;Strengthening (theraputty) Composite Extension: AROM;Both;Strengthening (theraputty) Digit Composite Abduction: AROM;Both;Strengthening (theraputty) Digit Composite Adduction: AROM;Both;Strengthening (theraputty) Thumb Abduction: AROM;Both;Strengthening Thumb Adduction: AROM;Strengthening;Both Opposition: AROM;Strengthening;Both          General Comments      Pertinent Vitals/ Pain       Pain Assessment: No/denies pain  Home Living Family/patient expects to be discharged to:: Private residence Living Arrangements: Spouse/significant other                                      Prior Functioning/Environment               Frequency  Min 2X/week        Progress Toward Goals  OT Goals(current goals can now be found in the care plan section)  Progress towards OT goals: Progressing toward goals  Acute Rehab OT Goals Patient Stated Goal: to get better OT Goal Formulation: With patient Time For Goal Achievement: 01/23/17 Potential to Achieve Goals: Good  Plan Discharge plan remains appropriate                     AM-PAC PT "6 Clicks" Daily Activity     Outcome Measure   Help from another person eating meals?: None Help from another person taking care of personal grooming?: A Little Help from another person toileting, which includes using toliet, bedpan, or urinal?: A Little Help from another person bathing (including washing, rinsing, drying)?: A Little Help from another person to put on and taking off regular upper body clothing?: A Little Help from another person to put on and taking off regular lower body clothing?: A Little 6 Click Score: 19    End of Session Equipment Utilized During Treatment: Gait belt  OT Visit Diagnosis: Unsteadiness on feet (R26.81);Other abnormalities of gait and mobility (R26.89);Repeated falls (R29.6);Muscle weakness (generalized) (M62.81);Low vision, both eyes (H54.2)   Activity Tolerance Patient tolerated treatment well   Patient Left in bed;with call bell/phone within reach;with bed alarm set;with family/visitor present   Nurse Communication Mobility status    Functional Assessment Tool Used: AM-PAC 6 Clicks Daily Activity;Clinical judgement Functional Limitation: Self care Self Care Current Status (Q9450): At least 20 percent but less than 40 percent impaired, limited or restricted Self Care Goal Status (T8882): At least 1 percent but less than 20 percent impaired, limited or restricted   Time: 8003-4917 OT Time Calculation (min): 21 min  Charges: OT G-codes **NOT FOR INPATIENT CLASS** Functional Assessment Tool Used: AM-PAC 6 Clicks  Daily Activity;Clinical judgement Functional Limitation: Self care Self Care Current Status (H1505): At least 20 percent but less than 40 percent impaired, limited or restricted Self Care Goal Status (W9794): At least 1 percent but less than 20 percent impaired, limited or restricted OT General Charges $OT Visit: 1 Procedure OT Treatments $Self Care/Home Management : 8-22 mins  Cody Silva, [place] Pager [phone] 01/10/2017    Cody Silva 01/10/2017, 4:45 PM

## 2017-01-10 NOTE — Care Management Note (Signed)
Case Management Note  Patient Details  Name: Cody Silva MRN: [id number] Date of Birth: Jan 06, 1946  Subjective/Objective:   Pt in with altered mental status. He is from home with his wife. Pts PCP is Dr Dani Gobble at the [facility].                 Action/Plan: CM consulted to arrange [facility] services. CM met with the patient and his wife and provided a list of [place] agencies. They selected [facility]. Santiago Glad with [facility] notified and accepted the referral. Wife asked to be the contact at: [phone]. Information given to Santiago Glad with [facility].  Wife will transport the patient home when medically ready.   Expected Discharge Date:                  Expected Discharge Plan:  [place]  In-House Referral:     Discharge planning Services  CM Consult  Post Acute Care Choice:  Home Health Choice offered to:     DME Arranged:    DME Agency:     HH Arranged:  PT, OT HH Agency:  [facility]  Status of Service:  Completed, signed off  If discussed at [place] of Stay Meetings, dates discussed:    Additional Comments:  Pollie Friar, RN 01/10/2017, 2:45 PM

## 2017-01-10 NOTE — Progress Notes (Signed)
OT Cancellation Note  Patient Details Name: Cody Silva MRN: [id number] DOB: Aug 18, 1946   Cancelled Treatment:    Reason Eval/Treat Not Completed: Patient at procedure or test/ unavailable; Pt being taken down for test/procedure, will check back later today if time permits.   Lou Cal, OT Pager [phone] 01/10/2017   [name] 01/10/2017, 2:29 PM

## 2017-01-10 NOTE — Discharge Instructions (Signed)
Cody Silva, you were admitted after having a TIA. This is also called a transient ischemic attack and is diagnosed when patient has stroke symptoms that resolve quickly. You had a thorough work-up to modify risk factors to reduce the chance of further events. Changes were made to your medications including: -ADDED Daily Aspirin 81 mg -CHANGED Daily Atorvastatin to 20 mg   It is important that you take your KEPPRA 500 MG THREE TIMES DAILY every day to reduce the chance of further seizures. Your Keppra level was therapeutic at time of discharge and it is important that you continue this medication after discharge.   You have follow-up with Neurology (Dr. Leonie Man) 01/11/17 and it is important that you do not miss this follow-up. You will also need to follow-up with your primary care provider at the [facility] within 1-2 weeks of discharge.

## 2017-01-10 NOTE — Progress Notes (Signed)
**  Preliminary report by tech**  Carotid artery duplex complete. Findings are consistent with a 1-39 percent stenosis involving the right internal carotid artery and the left internal carotid artery. The vertebral arteries demonstrate antegrade flow.  01/10/17 3:08 PM Cody Silva RVT

## 2017-01-10 NOTE — Progress Notes (Signed)
  Echocardiogram 2D Echocardiogram has been performed.  Jameia Makris T Lateesha Bezold 01/10/2017, 11:37 AM

## 2017-01-10 NOTE — Progress Notes (Signed)
Pt being discharged home with spouse. Discharge instructions were reviewed with the patient & wife. Wife & patient verbalized understanding.

## 2017-01-10 NOTE — Progress Notes (Signed)
   Subjective: Cody Silva reports he feels back to normal. Still kind of sleepy but otherwise feels fine. Weakness and difficulty speaking has resolved. Reports his wife is able to provide 24hr care and that his son/daughter live nearby and are going to assist as well. Aware he has neuro appt tomorrow with Dr. Leonie Man.  Objective:  Vital signs in last 24 hours: Vitals:   01/09/17 2103 01/10/17 0106 01/10/17 0446 01/10/17 0920  BP: (!) 159/68 (!) 160/78 (!) 169/69 (!) 153/82  Pulse: (!) 49 (!) 50 (!) 55 64  Resp: 18 18 18 16   Temp: 97.6 F (36.4 C) 97.6 F (36.4 C) 98.6 F (37 C) 98.2 F (36.8 C)  TempSrc: Oral Oral Oral Oral  SpO2: 100% 100% 99% 99%  Weight:      Height:       GENERAL- Pleasant AA male resting comfortably in bedside chair. Tired. HEENT-Oral mucosa appears moist, no carotid bruit appreciated. BL tongue fasciculations appreciated.  CARDIAC- RRR, no murmurs, rubs or gallops NEURO- Normal finger-nose-finger, normal rapid alternating movements. CN grossly intact although tongue fasciculations appreciated. Strength and sensation intact throughout BL UE and LE.  EXTREMITIES- Warm, dry and perfused. Symmetric, no pedal edema.  Assessment/Plan: Transient Ischemic Attack: Deficits resolved and fatigue improved. Scheduled to have Carotid US and TTE today. Lipid panel with great control on Atorvastatin. Loop recorder interrogated without AFib. HbA1c pending. Neurology on board and appreciate their assistance with work-up. Have recommended 3 months of DAPT. Aggressive RF modification. -PT recs HH PT/OT and 24hr supervision. Wife and family able to provide this at home.  -FU TTE, Carotid US -Ready for d/c today unless US findings necessitate further inpatient work-up/intervention  Subacute R internal capsule infarct, History of Seizures: In addition, MRI is suggestive of chronic microhemorrhages related to hypertension. Areas could be acting as eliptogenic foci. Patient  non-compliant with Depakote at home PTA however has been loaded and achieved therapeutic levels today at 50.  -Depakote 500 mg Q8H -Follow-up tomorrow scheduled with [facility]. -Seizure precautions  Hypertension: BP 150-160s on home Coreg and clonidine patch. We are holding chlorthalidone and losartan with mildly elevated BP and subacute infarct on MRI head. -Will need excellent BP control once out of peri-infarct period.  Diabetes: CBGs controlled on 1/2 normal dose of his basal insulin. Requiring minimal SSI. No changes.  Hypokalemia, Hx of Hypomagnesemia: K 3.0 this morning. Mag level ordered and returned as 1.7. Replaced K with 93mEq twice today + IV Mag 2g. He denies any n/v/diarrhea.  Dispo: Anticipated discharge today.   Einar Gip, DO PGY-I Internal Medicine Resident Pager# [phone] 01/10/2017, 10:52 AM

## 2017-01-10 NOTE — Progress Notes (Signed)
STROKE TEAM PROGRESS NOTE   SUBJECTIVE (INTERVAL HISTORY) No family at bedside. Echo tech is doing TTE. He is still lethargic but easily arousable, orientated, and able to participate conversation and exam well. depakote level 50. No acute event overnight.     OBJECTIVE Temp:  [97.6 F (36.4 C)-98.6 F (37 C)] 98.2 F (36.8 C) (05/09 0920) Pulse Rate:  [49-64] 64 (05/09 0920) Cardiac Rhythm: Normal sinus rhythm (05/09 0700) Resp:  [16-18] 16 (05/09 0920) BP: (144-169)/(66-82) 153/82 (05/09 0920) SpO2:  [97 %-100 %] 99 % (05/09 0920)  CBC:   Recent Labs Lab 01/08/17 2109 01/08/17 2147  WBC  --  5.3  NEUTROABS  --  4.1  HGB 11.6* 10.9*  HCT 34.0* 31.8*  MCV  --  87.6  PLT  --  099    Basic Metabolic Panel:   Recent Labs Lab 01/08/17 2101 01/08/17 2109 01/10/17 0430 01/10/17 0723  NA 139 140 141  --   K 4.0 4.0 3.0*  --   CL 104 100* 104  --   CO2 24  --  26  --   GLUCOSE 224* 219* 88  --   BUN 25* 29* 27*  --   CREATININE 2.33* 2.30* 2.16*  --   CALCIUM 9.4  --  9.0  --   MG  --   --   --  1.7    Lipid Panel:     Component Value Date/Time   CHOL 105 01/10/2017 0430   TRIG 40 01/10/2017 0430   HDL 52 01/10/2017 0430   CHOLHDL 2.0 01/10/2017 0430   VLDL 8 01/10/2017 0430   LDLCALC 45 01/10/2017 0430   HgbA1c:  Lab Results  Component Value Date   HGBA1C 7.1 (H) 09/10/2016   Urine Drug Screen:     Component Value Date/Time   LABOPIA NONE DETECTED 09/10/2016 1415   COCAINSCRNUR NONE DETECTED 09/10/2016 1415   LABBENZ NONE DETECTED 09/10/2016 1415   AMPHETMU NONE DETECTED 09/10/2016 1415   THCU NONE DETECTED 09/10/2016 1415   LABBARB NONE DETECTED 09/10/2016 1415    Alcohol Level     Component Value Date/Time   ETH <5 01/08/2017 2101    IMAGING I have personally reviewed the radiological images below and agree with the radiology interpretations.  Ct Head Code Stroke W/o Cm 01/08/2017 1. No acute intracranial abnormality. Unchanged appearance  of chronic bilateral lenticulocapsular lacunar infarcts. 2. ASPECTS is 10.   Mr Brain Wo Contrast 01/09/2017 1. 4 mm focus of diffusion hyperintensity with intermediate ADC at junction of right posterior limb of internal capsule and periventricular white probably representing a subacute infarct. 2. Scattered punctate foci of old microhemorrhage most compatible with chronic hypertension. 3. Otherwise no evidence for acute infarct, intracranial hemorrhage, or focal mass effect. 4. Mild chronic microvascular ischemic changes and mild parenchymal volume loss of the brain. 5. Trace left mastoid air cell effusion.   10/08/16 MRA head 1.   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 2.   No hemodynamically significant stenosis is noted. 3.   No aneurysms.  10/08/16 MRA neck This is a normal noncontrasted MR angiogram of the neck arteries.  EEG This predominantly drowsy and asleep EEG is abnormal due to mild diffuse slowing of the waking background. Clinical Correlation of the above findings indicates diffuse cerebral dysfunction that is non-specific in etiology and can be seen with hypoxic/ischemic injury, toxic/metabolic  encephalopathies, neurodegenerative disorders, medication effect, or due to excessive drowsiness.  The absence of epileptiform discharges does not rule out a clinical diagnosis of epilepsy.  Clinical correlation is advised.  TTE pending   PHYSICAL EXAM  Temp:  [97.6 F (36.4 C)-98.6 F (37 C)] 98.2 F (36.8 C) (05/09 0920) Pulse Rate:  [49-64] 64 (05/09 0920) Resp:  [16-18] 16 (05/09 0920) BP: (144-169)/(66-82) 153/82 (05/09 0920) SpO2:  [97 %-100 %] 99 % (05/09 0920)  General - Well nourished, well developed, mildly lethargic.  Ophthalmologic - Fundi not visualized due to noncooperation.  Cardiovascular - Regular rate and rhythm.  Mental Status -   Level of arousal and orientation to time, place, and person were intact. Language including expression, naming, repetition, comprehension was assessed and found intact. Fund of Knowledge was assessed and was intact.  Cranial Nerves II - XII - II - Visual acuity OS can see hand waving, OD can see finger counting but decreased to hand waving at left lower quadrant. III, IV, VI - Extraocular movements intact. V - Facial sensation intact bilaterally. VII - mild right nasolabial fold flattening. VIII - Hearing & vestibular intact bilaterally. X - Palate elevates symmetrically. XI - Chin turning & shoulder shrug intact bilaterally. XII - Tongue protrusion intact.  Motor Strength - The patient's strength was normal in all extremities and pronator drift was absent.  Bulk was normal and fasciculations were absent.   Motor Tone - Muscle tone was assessed at the neck and appendages and was normal.  Reflexes - The patient's reflexes were 1+ in all extremities and he had no pathological reflexes.  Sensory - Light touch, temperature/pinprick were assessed and were symmetrical.    Coordination - The patient had normal movements in the hands with no ataxia or dysmetria.  Tremor was absent.  Gait and Station - deferred.   ASSESSMENT/PLAN Mr. Cody Silva is a 71 y.o. male with history of multiple episodes of loss of consciousness with R sided twitching, recently started on Depakote presenting with inability to speak. He did not receive IV t-PA due to rapidly improving symptoms.   Episodes of LOC  Multiple episodes since 07/2016  Last episode last Tuesday (one week ago) with staring spell, drooling, and followed by LOC  Followed with Dr. Leonie Man in clinic at [facility], was prescribed depakote but never started  Repeated EEG showed no seizure activities   Recent MRI and MRA head and neck not revealing except mild stable BA stenosis  This admission repeat EEG negative.   BP 120s during  episodes  Restarted depakote 500mg  Q8h this admission  Depakote level this am 50  EEG mild diffuse slow this admission  Pt has follow up appointment 01/11/17 at [facility] with Dr. 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 This finding can not explain pt presenting symptoms.   Resultant - transient difficulty speech, right facial and arm weakness  Code Stroke CT no acute stroke. Old B lenticulocapsular lacunes. Aspects 10.    MRI  R PLIC subacute infarct. Scattered microhemorrhages. small vessel disease  MRA head and neck in 10/2016 mild stable BA stenosis.   2D Echo  pending  EEG mild diffuse slow  Loop recorder interrogation - no afib  LDL 45   HgbA1c pending  Lovenox 40 mg sq daily for VTE prophylaxis Diet Carb Modified Fluid consistency: Thin; Room service appropriate? Yes  clopidogrel 75 mg daily prior to admission, now on aspirin 325 mg daily and  clopidogrel 75 mg daily. Continue DAPT for 3 months and the plavix alone.   Patient counseled to be compliant with his antithrombotic medications  Ongoing aggressive stroke risk factor management  Therapy recommendations:  pending   Disposition:  pending   Hx stroke/TIA  09/2016 - multiple episodes syncope/LOC. MRI no acute stroke. New R IC lacune since previous MRI. EEG neg  07/2016 - LOC episodes - EEG neg, CT neg, loop recorder neg  12/2015 - loop insertion  09/2015 - syncope, doubt TIA (transient episode of slurred speech, facial droop and blurred vision) - MRI / MRA / CUS/TTE neg - A1C 7.9 LDL 137  02/2013 - B ACA infarcts - CUS/TTE/EEG neg - A1C 7.5  Hx of LGIB s/p polypectomy  10/2014 received 1 unit PRBC  Thought due to polypectomy and GI work up not revealing  Resumed plavix in 7 days  No more GIB since  Hypertension  Elevated up to 211/85  160/74 this am  Recommend long term BP goal normotensive   Hyperlipidemia  Home meds:  lipitor  80  lipitor decreased to 20 in hospital  LDL 45, goal < 70  Continue statin at discharge  Diabetes type II Diabetic neuropathy Diabetic retinopathy   HgbA1c pending, goal < 7.0  CBG under control  SSI  Other Stroke Risk Factors  Advanced age  ETOH use, advised to drink no more than 2 drink(s) a day  Family hx stroke (brother)  Obstructive sleep apnea, supposed to be on CPAP at home  Other Active Problems  PTSD  Seasonal allergies  Glaucoma   BPH  Follows with Dr. Leonie Man at [facility]. Next appointment 01/11/17  Hospital day # 0  Neurology will sign off. Please call with questions. Pt will follow up with Leonie Man at [facility] tomorrow. Thanks for the consult.   Rosalin Hawking, MD PhD Stroke Neurology 01/10/2017 11:27 AM    To contact Stroke Continuity provider, please refer to [email or web address] After hours, contact General Neurology

## 2017-01-11 ENCOUNTER — Ambulatory Visit: Payer: Medicare Other | Admitting: Neurology

## 2017-01-11 ENCOUNTER — Ambulatory Visit (INDEPENDENT_AMBULATORY_CARE_PROVIDER_SITE_OTHER): Payer: Medicare Other | Admitting: Neurology

## 2017-01-11 ENCOUNTER — Encounter: Payer: Self-pay | Admitting: Neurology

## 2017-01-11 VITALS — BP 177/98 | HR 69 | Ht 76.5 in | Wt 196.4 lb

## 2017-01-11 DIAGNOSIS — R569 Unspecified convulsions: Secondary | ICD-10-CM

## 2017-01-11 DIAGNOSIS — G4733 Obstructive sleep apnea (adult) (pediatric): Secondary | ICD-10-CM

## 2017-01-11 LAB — HEMOGLOBIN A1C
Hgb A1c MFr Bld: 6.7 % — ABNORMAL HIGH (ref 4.8–5.6)
MEAN PLASMA GLUCOSE: 146 mg/dL

## 2017-01-11 NOTE — Patient Instructions (Signed)
I had a long discussion with the patient and his wife regarding his recent episode of possible Compex partial seizures and reviewed imaging films personally. I recommend he continue Depakote 500 mg 3 times daily for now but if his sleepiness and dizziness continues may consider reducing the dose the future. Continue aspirin and Plavix for stroke prevention for 3 months and then Plavix alone. Maintain aggressive risk factor modification with strict control of hypertension with blood pressure goal below 130/90, lipids with LDL cholesterol goal below 70 mg percent and diabetes with hemoglobin A1c goal below 6.5%. I also counseled the patient to use his CPAP for his sleep apnea and will refer the patient to Dr. Brett Fairy or Rexene Alberts for counseling and discussion about CPAP options. He will return for follow-up in 3 months with my nurse practitioner or call earlier if necessary

## 2017-01-12 NOTE — Progress Notes (Signed)
[facility]  PATIENT: Cody Silva DOB: [birth year]   REASON FOR VISIT: Follow-up for CVA HISTORY FROM: Patient    HISTORY OF PRESENT ILLNESS:71 year male seen for first office f/u visit after [facility] admission on 02/20/2013.he presented with 3 month h/o intermittent slurred speech, leg weakness and 1 episode of syncope.he was evaluated at the [facility] clinic and had several head CTs which were unremarkable.he had 2 episodes of falling on day of admission and MRI brain showed a large 4 x 4 cm diffusion positive lesion in corpus callosum involving right more than left cingulate guyrus with only faint enhancement. It was unclear wether this was a callosal branch anterior cerebral artery infarct or a mass lesion.MRA showed decrease flow in median artery of corpus callosum.Carotid dopplers and 2DEcho showed normal ejection fraction. EEG was normal. Lipid profile was normal. HbA1c was elevated at 7.5% He is tolerating plavix well without bleeding or side effects.He states his gait and balance have improved and he is walking well. He notices some imbalance when he is in a hurry or tired but is able to catch himself without falling. He has sleep apnea but is not using his CPAP mask daily.  UPDATE 09/12/13 (LL): Cody Silva returns to office for stroke revisit. He states he has been doing well, having some headaches but not too often. He complains that his memory is not as well as before the stroke. His wife does not notice any problem with his memory. His follow up MRI showed expected evolutionary changes, no mention of mass lesion. He is tolerating Plavix well but has stopped currently to have a tooth extracted early next week. His BP is elevated in office today, at 166/92, but he says it is usually lower.   UPDATE 03/12/14 (LL): Since last visit, he has not had any new neurovascular symptoms. He has started to have more difficulties with imbalance. 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 His blood pressure is elevated in the office today, 188/90.  He states it is not that high when he takes it at home. He is having progressively more acting out dreams, but his wife states that he does not get out of bed.  He has PTSD from being in the [place] War, and has had treatment through the [place]. He does not wear his cpap consistently. His blood sugars in the morning range from 130-170, but he has had some problems with lows as well.  He is tolerating Plavix well with no signs of significant bleeding or bruising. Update 1/18/2016PS : He returns for follow-up after last visit 6 months ago. She continues to have poor balance. He did go for outpatient physical therapy following the last visit but it did not seem to help a lot. The patient's wife states that he had a transient episode of facial droop as well as increased imbalance about a month ago but they did not seek medical help at that time. The patient continues not to use his CPAP regularly but he does plan to go to the [place] and get his recalibrated so it fits better. He remains on Plavix which is tolerating well but plans to get an elective dental procedure and wants to stop it for 3 days prior to the procedure. He states his fasting sugars have been quite good and last hemoglobin A1c was 7.0  3 months ago. His blood pressure has been well controlled. His last lipid profile was also fine.  He has not had a carotid Doppler done since he left the hospital a year and a half ago.  Update 03/22/2015 PS: He returns for follow-up after last visit 6 months ago. He was admitted in February 2016 twice to the hospital initially with episodes of GI bleed and subsequently with anemia and generalized weakness. He see blood transfusion. He'll stop Plavix for 7 days but GI workup did not reveal a source of bleeding. Plavix has been restarted and is tolerating it well. He however does still complain of generalized weakness and at times feels he is weak  and cannot get out of a chair and has trouble climbing steps. He has not had any recent lab work done but he he plans to see his primary care physician at the [facility] [place] but is not happy with the care and plans for Transfer to a new physician in [place]. He still continues to have occasional speech difficulties and continues to have mild balance difficulties though he is not had any falls. Update 4/12/2017PS : He returns for follow-up after last visit 9 months ago. He was hospitalized briefly in January 2017 with transient episode of slurred speech, facial droop and blurred vision. MRI scan of the brain personally reviewed did not reveal an acute infarct. MRA brain showed no large vessel stenosis or occlusion. Carotid ultrasound was unremarkable. Transthoracic echo showed normal ejection fraction. Hemoglobin A1c was elevated at 7.9 and LDL cholesterol 137. Patient was started on Plavix and continued on his blood pressure and cholesterol medications. He states is tolerating all medications well without any side effects. His blood pressure is not doing well and  today is elevated at 148/82. He has recently changed his primary care physician and plans to work with him towards getting better controlled. His fasting sugars have been all okay but his last hemoglobin A1c in January was elevated at 7.9. Patient states he has significant difficulty with walking particularly steps he feels off balance. When he bends down suddenly or turns his neck he feels off balance. His noticed weakness in his hand muscles as well as wasting and often drops objects from his hands. and he has seen in spine surgeon at [facility] and plans treatment of her neck surgery but wants to wait till June to have this done. He also plans on having some tooth removed and isn't willing to wait till June as well. At times he has had the malls while climbing steps. 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 He  wants to be referred for outpatient occupational therapy to work on his hand strength UPDATE 06/15/16 CM Mr. Demaria, 71 year old male returns for follow-up. He has a history of CVA in 2014. Patient is currently on Plavix without further stroke or TIA symptoms since last seen blood pressure was elevated in the office today however he states he is taking his medications. He continues to have some balance problems and difficulty going up and down steps. He is using a single-point cane to ambulate which makes him safer. If he makes sudden movements he feels off balance. He is still planning on having cervical spine surgery andhe needs to be off Plavix days prior to the procedure. Most recent hemoglobin A1c 6.7. He gets most of his medical care through the [place] in [place]. He returns for reevaluation Update 10/03/2016 : He returns for follow-up after last visit 3-1/2 months ago. Recommend by his wife. Since November  he said multiple episodes of brief loss of consciousness. He states his does not have any warnings and just passes out and is unresponsive for several minutes. The longest episode he was unresponsive for around 8 minutes. He had one episode the day prior to Thanksgiving when he was at [employer] when he passed out. A week later at home in the kitchen the wife notes an episode when he complained of feeling weak and all of a sudden he fell down. Ambulance was called and was taken to the hospital. He also had an episode last week when he was at the [facility] clinic standing at the counter when he complained of feeling weak and he started sweating and then he passed out. This this time he is out for around 5 minutes. He improved after his legs were elevated. Patient was found to have blood pressure 100/60. Passed out and shortly after he woke up blood pressure was noted as being greater than 200/146. Patient had an EEG done in the hospital on 09/11/16 which was normal. He had several CT scans which were  unremarkable. The patient has a loop recorder in place for syncope and apparently so far no significant cardiac arrhythmia has been found. Patient denies any tonic-clonic activity or tongue bite with episodes. I have reviewed one of his previous admissions for similar episode and [facility] at that time he was noted as having gaze deviation to one side and transient right-sided weakness in the episode at the [facility] but that improved by the time the patient was seen in emergency room at [facility]. Patient does have a prior history of stroke several years ago with infarction of the median artery of the corpus callosum with bilateral parasagittal infarct. At that time initial MRI had suggested stenosis of the basilar artery however subsequent MRA of the brain done in January 2017 had shown only mild basilar artery stenosis. Patient states he is not dehydrated and drinks enough fluids. He denies classical symptoms of orthostatic hypertension. Denies significant cardiac arrhythmias chest pain or shortness of breath during these episodes. He does have an appointment to see Dr. Rayann Heman his cardiologist soon. Update 01/11/2017 : He returns for follow-up after last visit 3 months ago. He was recently hospitalized last week with episodes of transient unresponsiveness with right-sided twitching thought to be seizure. Started on Depakote. EEG did not show any seizure activity. MRI scan showed a small subacute right basal ganglia infarct which was felt to be incidental and unrelated to his symptoms. Loop recorder interrogation did not show any atrial fibrillation. Patient is tolerating Depakote well. He is seen his nephrologist is concerned that the patient has not been using his CPAP regularly. Patient patient does state that the Depakote makes him sleepy but is just barely started a week ago. Patient was on  Plavix and aspirin has been added to this because of his subacute basal ganglia infarct  noted on recent admission. Patient states he has a CPAP at home but has not been using it because he doesn't like it. His willing to see one of my partners who specializes in sleep to discuss other options. His blood pressure yet remains suboptimally controlled and today it is 177/98 and 183/97 in office. REVIEW OF SYSTEMS: Full 14 system review of systems performed and notable only for those listed, all others are neg:   Activity and appetite change, trouble swallowing, diarrhea, cold intolerance, daytime sleepiness, snoring, walking difficulty, passing out and all other  systems negative  ALLERGIES: Allergies  Allergen Reactions  . Metformin And Related Diarrhea  . Tape Rash    HOME MEDICATIONS: Outpatient Medications Prior to Visit  Medication Sig Dispense Refill  . acetaminophen (TYLENOL) 500 MG tablet Take 1 tablet (500 mg total) by mouth every 6 (six) hours as needed for mild pain, moderate pain, fever or headache. 30 tablet 0  . albuterol (PROVENTIL HFA;VENTOLIN HFA) 108 (90 BASE) MCG/ACT inhaler Inhale 2 puffs into the lungs every 6 (six) hours as needed for wheezing.    [name] aspirin EC 81 MG EC tablet Take 1 tablet (81 mg total) by mouth daily. 30 tablet 0  . atorvastatin (LIPITOR) 20 MG tablet Take 1 tablet (20 mg total) by mouth daily at 6 PM. 30 tablet 3  . carvedilol (COREG) 25 MG tablet Take 25 mg by mouth 2 (two) times daily. Reported on 09/14/2015    . cetirizine (ZYRTEC) 10 MG tablet Take 10 mg by mouth at bedtime as needed for allergies.    . chlorthalidone (HYGROTON) 25 MG tablet Take 25 mg by mouth daily.    . cloNIDine (CATAPRES - DOSED IN MG/24 HR) 0.2 mg/24hr patch Place 0.2 mg onto the skin once a week.    . clopidogrel (PLAVIX) 75 MG tablet Take 1 tablet (75 mg total) by mouth at bedtime. (Patient taking differently: Take 75 mg by mouth daily. )    . divalproex (DEPAKOTE) 500 MG DR tablet Take 1 tablet (500 mg total) by mouth 3 (three) times daily. 90 tablet 0  . feeding  supplement, ENSURE ENLIVE, (ENSURE ENLIVE) LIQD Take 237 mLs by mouth 2 (two) times daily after a meal. (Patient taking differently: Take 237 mLs by mouth daily. ) 237 mL 12  . fluocinonide cream (LIDEX) 6.16 % Apply 1 application topically daily.     . fluticasone (FLONASE) 50 MCG/ACT nasal spray Place 2 sprays into both nostrils daily as needed for allergies.     [name] gabapentin (NEURONTIN) 300 MG capsule Take 300 mg by mouth at bedtime.     . Ginkgo Biloba (GNP GINGKO BILOBA EXTRACT PO) Take 1 capsule by mouth 2 (two) times a week.     . insulin aspart (NOVOLOG) 100 UNIT/ML injection Inject 5-15 Units into the skin 3 (three) times daily with meals. Per sliding scale    . insulin glargine (LANTUS) 100 UNIT/ML injection Inject 20 Units into the skin at bedtime.     [name] latanoprost (XALATAN) 0.005 % ophthalmic solution Place 1 drop into both eyes at bedtime.     [name] losartan (COZAAR) 100 MG tablet Take 100 mg by mouth daily.    . Multiple Vitamins-Minerals (MULTIVITAMIN PO) Take 1 tablet by mouth daily.    . polyvinyl alcohol (LIQUIFILM TEARS) 1.4 % ophthalmic solution Place 1 drop into both eyes 5 (five) times daily as needed (for dry eyes).    . potassium chloride (K-DUR,KLOR-CON) 10 MEQ tablet Take 10 mEq by mouth 2 (two) times daily.    . sertraline (ZOLOFT) 100 MG tablet Take 100 mg by mouth daily.    . Skin Protectants, Misc. (EUCERIN) cream Apply 1 application topically daily. FEET AND LEGS    . tamsulosin (FLOMAX) 0.4 MG CAPS capsule Take 0.8 mg by mouth at bedtime.     No facility-administered medications prior to visit.     PAST MEDICAL HISTORY: Past Medical History:  Diagnosis Date  . Anemia   . Arthritis    "right arm, right ankle, right side" (  10/30/2014)  . Colon polyps   . Diabetic neuropathy ([place])   . Diabetic retinopathy ([place])   . H/O agent Orange exposure   . Headache    "@ least 3 times/wk" (10/30/2014)  . History of blood transfusion 10/30/2014   hematochezia  . History of  gout   . Hypertension   . OSA on CPAP    "suppose to wear mask; I've got a call in for an equipment change" (10/30/2014)  . PTSD (post-traumatic stress disorder)    "service related"  . Seizures ([place])   . Stroke [facility]) 2014   left extremity deficits; facial left  . Type II diabetes mellitus ([place])     PAST SURGICAL HISTORY: Past Surgical History:  Procedure Laterality Date  . BONE GRAFT HIP ILIAC CREST Left ~ 1976  . CATARACT EXTRACTION W/ INTRAOCULAR LENS  IMPLANT, BILATERAL Bilateral 2014-2015  . EP IMPLANTABLE DEVICE N/A 12/24/2015   Procedure: Loop Recorder Insertion;  Surgeon: Thompson Grayer, MD;  Location: [facility];  Service: Cardiovascular;  Laterality: N/A;  . TUMOR REMOVAL Right ~ 1976   "arm; had to take bone left hip to add to the repair"    FAMILY HISTORY: Family History  Problem Relation Age of Onset  . Diabetes Mother   . Breast cancer Mother   . Heart attack Mother        CABG - Age 86  . Kidney disease Mother   . Stroke Brother   . Lung disease Father   . Neurofibromatosis Maternal Uncle   . Throat cancer Brother   . Hypertension Brother     SOCIAL HISTORY: Social History   Social History  . Marital status: Married    Spouse name: sheila  . Number of children: 4  . Years of education: 74   Occupational History  . Not on file.   Social History Main Topics  . Smoking status: Never Smoker  . Smokeless tobacco: Never Used  . Alcohol use 0.6 oz/week    1 Cans of beer per week     Comment: 10/30/2014 "might have a beer a couple times/yr"  . Drug use: No  . Sexual activity: Yes   Other Topics Concern  . Not on file   Social History Narrative   Patient is married with 4 children   Patient is right handed   Patient has a Water quality scientist degree    Patient 4 cups daily   Retired Engineer, structural. Lives with wife, daughter, cousin in [place].   Independent in ADLs and partially dependent for IADLs.   Ambulates with cane sometimes due to  intermittent right leg weakness.      PHYSICAL EXAM  Vitals:   01/11/17 1041  BP: (!) 177/98  Pulse: 69  Weight: 196 lb 6.4 oz (89.1 kg)  Height: 6' 4.5" (1.943 m)   Body mass index is 23.6 kg/m. General: Frail elderly, african american male seated, in no evident distress  Head: head normocephalic and atraumatic.   Neck: supple with no carotid or supraclavicular bruits  Cardiovascular: regular rate and rhythm, no murmurs  Musculoskeletal: no deformity  Skin: no rash/petichiae  Vascular: Normal pulses all extremities    Neurological examination  Mental Status: Awake and fully alert. Oriented to place and time.  Attention span, concentration and fund of knowledge appropriate. Mood and affect appropriate.  Cranial Nerves: Pupils equal, briskly reactive to light. Extraocular movements full without nystagmus. Diminished vision acuity in the left eye from diabetic retinopathy Visual fields diminished  in the left eye periphery. Hearing intact. Facial sensation intact. Face, tongue, palate moves normally and symmetrically.  Motor: Normal bulk and tone. Normal strength in all tested extremity muscles.diminished fine finger movements on left and orbits right over left upper extremity.  Sensory.: intact to tough and pinprick and vibratory.  Coordination: Rapid alternating movements normal in all extremities. Finger-to-nose and heel-to-shin performed accurately bilaterally.  Gait and Station: Arises from chair without difficulty. Stance is normal. Gait Is broad-based with some stiffness of the right leg.  Poor arm swing on the left. Unable to heel, toe walk without difficulty. Tandem unsteady. Reflexes: 1+ and symmetric. Toes downgoing.   DIAGNOSTIC DATA (LABS, IMAGING, TESTING) - I reviewed patient records, labs, notes, testing and imaging myself where available.  Lab Results  Component Value Date   WBC 5.3 01/08/2017   HGB 10.9 (L) 01/08/2017   HCT 31.8 (L) 01/08/2017   MCV 87.6  01/08/2017   PLT 158 01/08/2017      Component Value Date/Time   NA 141 01/10/2017 0430   K 3.0 (L) 01/10/2017 0430   CL 104 01/10/2017 0430   CO2 26 01/10/2017 0430   GLUCOSE 88 01/10/2017 0430   BUN 27 (H) 01/10/2017 0430   CREATININE 2.16 (H) 01/10/2017 0430   CALCIUM 9.0 01/10/2017 0430   PROT 6.9 01/08/2017 2101   ALBUMIN 3.6 01/08/2017 2101   AST 20 01/08/2017 2101   ALT 9 (L) 01/08/2017 2101   ALKPHOS 59 01/08/2017 2101   BILITOT 1.0 01/08/2017 2101   GFRNONAA 29 (L) 01/10/2017 0430   GFRAA 34 (L) 01/10/2017 0430   Lab Results  Component Value Date   CHOL 105 01/10/2017   HDL 52 01/10/2017   LDLCALC 45 01/10/2017   TRIG 40 01/10/2017   CHOLHDL 2.0 01/10/2017   Lab Results  Component Value Date   HGBA1C 6.7 (H) 01/10/2017   Lab Results  Component Value Date   VITAMINB12 451 09/10/2016   Lab Results  Component Value Date   TSH 0.708 09/10/2016      ASSESSMENT AND PLAN 71 year old AA male with bilateral corpus callosal diffusion positive lesion in June 2014- 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  Admission in January 2017 due to a TIA. Vascular risk factors of HTN, Sleep Apnea and Diabetes.  .    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 I recommend he continue Depakote 500 mg 3 times daily for now but if his sleepiness and dizziness continues may consider reducing the dose the future. Continue aspirin and Plavix for stroke prevention for 3 months and then Plavix alone. Maintain aggressive risk factor modification with strict control of hypertension with blood pressure goal below 130/90, lipids with LDL cholesterol goal below 70 mg percent and diabetes with  hemoglobin A1c goal below 6.5%. I also counseled the patient to use his CPAP for his sleep apnea and will refer the patient to Dr. Brett Fairy or Rexene Alberts for counseling and discussion about CPAP options. He will return for follow-up in 3 months with my nurse practitioner or call earlier if necessaryGreater than 50% time during this 30 minute visit was spent on counseling and coordination of care about stroke,   discussion of evaluation plan  and answering questions Return for follow-up with me in 3 months or call earlier if necessary Antony Contras, MD [facility] [address], [place], [place] [address] [phone]

## 2017-01-15 ENCOUNTER — Ambulatory Visit (INDEPENDENT_AMBULATORY_CARE_PROVIDER_SITE_OTHER): Payer: Medicare Other | Admitting: Internal Medicine

## 2017-01-15 ENCOUNTER — Encounter: Payer: Self-pay | Admitting: Internal Medicine

## 2017-01-15 VITALS — BP 126/80 | HR 72 | Ht 76.5 in | Wt 189.4 lb

## 2017-01-15 DIAGNOSIS — I639 Cerebral infarction, unspecified: Secondary | ICD-10-CM

## 2017-01-15 DIAGNOSIS — R55 Syncope and collapse: Secondary | ICD-10-CM | POA: Diagnosis not present

## 2017-01-15 LAB — CUP PACEART INCLINIC DEVICE CHECK
Date Time Interrogation Session: 20180514143826
Implantable Pulse Generator Implant Date: 20170421

## 2017-01-15 NOTE — Patient Instructions (Signed)
Medication Instructions:  Your physician recommends that you continue on your current medications as directed. Please refer to the Current Medication list given to you today.  Follow-Up: Your physician recommends that you schedule a follow-up appointment: as needed with Dr. Rayann Heman.       Any Other Special Instructions Will Be Listed Below (If Applicable).     If you need a refill on your cardiac medications before your next appointment, please call your pharmacy.

## 2017-01-15 NOTE — Progress Notes (Signed)
PCP: Clinic, Cody Silva is a 71 y.o. male who presents today for routine electrophysiology followup.  He has an ILR implanted for cryptogenic stroke and presyncope.  He has been followed remotely and has had no arrhythmias detected.  He is doing reasonably well.  He is followed closely by Dr Leonie Man for ongoing TIAs and complex seizure activity.  Today, he denies symptoms of palpitations, chest pain, shortness of breath,  lower extremity edema, dizziness, presyncope, or syncope.  The patient is otherwise without complaint today.   Past Medical History:  Diagnosis Date  . Anemia   . Arthritis    "right arm, right ankle, right side" (10/30/2014)  . Colon polyps   . Diabetic neuropathy ([place])   . Diabetic retinopathy ([place])   . H/O agent Orange exposure   . Headache    "@ least 3 times/wk" (10/30/2014)  . History of blood transfusion 10/30/2014   hematochezia  . History of gout   . Hypertension   . OSA on CPAP    "suppose to wear mask; I've got a call in for an equipment change" (10/30/2014)  . PTSD (post-traumatic stress disorder)    "service related"  . Seizures ([place])   . Stroke [facility]) 2014   left extremity deficits; facial left  . Type II diabetes mellitus ([place])    Past Surgical History:  Procedure Laterality Date  . BONE GRAFT HIP ILIAC CREST Left ~ 1976  . CATARACT EXTRACTION W/ INTRAOCULAR LENS  IMPLANT, BILATERAL Bilateral 2014-2015  . EP IMPLANTABLE DEVICE N/A 12/24/2015   Procedure: Loop Recorder Insertion;  Surgeon: Thompson Grayer, MD;  Location: [place] [facility];  Service: Cardiovascular;  Laterality: N/A;  . TUMOR REMOVAL Right ~ 1976   "arm; had to take bone left hip to add to the repair"    ROS- all systems are reviewed and negatives except as per HPI above  Current Outpatient Prescriptions  Medication Sig Dispense Refill  . acetaminophen (TYLENOL) 500 MG tablet Take 1 tablet (500 mg total) by mouth every 6 (six) hours as needed for mild pain,  moderate pain, fever or headache. 30 tablet 0  . albuterol (PROVENTIL HFA;VENTOLIN HFA) 108 (90 BASE) MCG/ACT inhaler Inhale 2 puffs into the lungs every 6 (six) hours as needed for wheezing.    [name] aspirin EC 81 MG EC tablet Take 1 tablet (81 mg total) by mouth daily. 30 tablet 0  . atorvastatin (LIPITOR) 20 MG tablet Take 1 tablet (20 mg total) by mouth daily at 6 PM. 30 tablet 3  . carvedilol (COREG) 25 MG tablet Take 25 mg by mouth 2 (two) times daily. Reported on 09/14/2015    . cetirizine (ZYRTEC) 10 MG tablet Take 10 mg by mouth at bedtime as needed for allergies.    . chlorthalidone (HYGROTON) 25 MG tablet Take 25 mg by mouth daily.    . cloNIDine (CATAPRES - DOSED IN MG/24 HR) 0.2 mg/24hr patch Place 0.2 mg onto the skin once a week.    . clopidogrel (PLAVIX) 75 MG tablet Take 1 tablet (75 mg total) by mouth at bedtime.    . divalproex (DEPAKOTE) 500 MG DR tablet Take 1 tablet (500 mg total) by mouth 3 (three) times daily. 90 tablet 0  . feeding supplement, ENSURE ENLIVE, (ENSURE ENLIVE) LIQD Take 237 mLs by mouth 2 (two) times daily after a meal. 237 mL 12  . fluocinonide cream (LIDEX) 2.95 % Apply 1 application topically daily.     [name]  fluticasone (FLONASE) 50 MCG/ACT nasal spray Place 2 sprays into both nostrils daily as needed for allergies.     [name] gabapentin (NEURONTIN) 300 MG capsule Take 300 mg by mouth at bedtime.     . Ginkgo Biloba (GNP GINGKO BILOBA EXTRACT PO) Take 1 capsule by mouth 2 (two) times a week.     . insulin aspart (NOVOLOG) 100 UNIT/ML injection Inject 5-15 Units into the skin 3 (three) times daily with meals. Per sliding scale    . insulin glargine (LANTUS) 100 UNIT/ML injection Inject 20 Units into the skin at bedtime.     [name] latanoprost (XALATAN) 0.005 % ophthalmic solution Place 1 drop into both eyes at bedtime.     [name] losartan (COZAAR) 100 MG tablet Take 100 mg by mouth daily.    . Multiple Vitamins-Minerals (MULTIVITAMIN PO) Take 1 tablet by mouth daily.    .  polyvinyl alcohol (LIQUIFILM TEARS) 1.4 % ophthalmic solution Place 1 drop into both eyes 5 (five) times daily as needed (for dry eyes).    . potassium chloride (K-DUR,KLOR-CON) 10 MEQ tablet Take 10 mEq by mouth 2 (two) times daily.    . sertraline (ZOLOFT) 100 MG tablet Take 100 mg by mouth daily.    . Skin Protectants, Misc. (EUCERIN) cream Apply 1 application topically daily. FEET AND LEGS    . tamsulosin (FLOMAX) 0.4 MG CAPS capsule Take 0.8 mg by mouth at bedtime.     No current facility-administered medications for this visit.     Physical Exam: Vitals:   01/15/17 1355  BP: 126/80  Pulse: 72  SpO2: 99%  Weight: 189 lb 6.4 oz (85.9 kg)  Height: 6' 4.5" (1.943 m)    GEN- The patient is well appearing, alert and oriented x 3 today.   Head- normocephalic, atraumatic Eyes-  Sclera clear, conjunctiva pink Ears- hearing intact Oropharynx- clear ILR site is well healed Lungs- Clear to ausculation bilaterally, normal work of breathing Heart- Regular rate and rhythm, no murmurs, rubs or gallops, PMI not laterally displaced GI- soft, NT, ND, + BS Extremities- no clubbing, cyanosis, or edema  ILR is personally reviewed today,  See paceart  Assessment and Plan:  1. Cryptogenic stroke/ presyncope No arrhythmias identified on ILR No new recommendations Will continue to follow remotely  2. HTN Stable No change required today Follow-up with PCP through the [place]  Will continue remote monitoring Return as needed  Thompson Grayer MD, [facility] 01/15/2017 2:39 PM

## 2017-01-16 ENCOUNTER — Telehealth: Payer: Self-pay | Admitting: Student in an Organized Health Care Education/Training Program

## 2017-01-16 DIAGNOSIS — R531 Weakness: Secondary | ICD-10-CM

## 2017-01-16 NOTE — Telephone Encounter (Signed)
[place], thank you Pramod. I have ordered the EMG/NCV study. Please let me know if there is anything else I can do to help.   Cody Silva

## 2017-01-16 NOTE — Telephone Encounter (Signed)
-----   Message from Garvin Fila, MD sent at 01/15/2017  6:45 PM EDT ----- Regarding: RE: Question on this patient ALS is certainly a worrisome diagnosis and the best way to confirm is to do EMG nerve conduction study. Unfortunately I'll be away for the next 2 weeks  Do you mind ordering outpatient EMG/NCV study one of my partners could do that and rule out or rule it in [place] ----- Message ----- From: Axel Filler, MD Sent: 01/11/2017   1:56 PM To: Garvin Fila, MD Subject: Question on this patient                       Dear Dr. Leonie Man,  I saw this patient during his hospital admission. Yesterday on exam I noticed that he had bilateral waisting of his thenar eminences, and he had fasciculations of his tongue. The only thing I know to think of with those findings is ALS. I am certainly no expert in working up that kind of condition, and just wanted to touch base with you to see if you think that may explain his strange functional decline over the last few months. This is certainly a challenging case.   Thanks,  Lalla Brothers

## 2017-01-16 NOTE — Telephone Encounter (Signed)
Thanks. One of my partners will do this and answer your question

## 2017-01-17 ENCOUNTER — Ambulatory Visit (INDEPENDENT_AMBULATORY_CARE_PROVIDER_SITE_OTHER): Payer: Medicare Other | Admitting: *Deleted

## 2017-01-17 DIAGNOSIS — I639 Cerebral infarction, unspecified: Secondary | ICD-10-CM

## 2017-01-17 NOTE — Progress Notes (Signed)
Carelink Summary Report / Loop Recorder 

## 2017-01-18 ENCOUNTER — Observation Stay (HOSPITAL_COMMUNITY)
Admission: EM | Admit: 2017-01-18 | Discharge: 2017-01-20 | Disposition: A | Discharge status: Home / Self Care | Payer: Medicare Other | Attending: Oncology | Admitting: Oncology

## 2017-01-18 ENCOUNTER — Emergency Department (HOSPITAL_COMMUNITY): Payer: Medicare Other

## 2017-01-18 ENCOUNTER — Encounter (HOSPITAL_COMMUNITY): Payer: Self-pay

## 2017-01-18 DIAGNOSIS — R29818 Other symptoms and signs involving the nervous system: Secondary | ICD-10-CM | POA: Insufficient documentation

## 2017-01-18 DIAGNOSIS — Z803 Family history of malignant neoplasm of breast: Secondary | ICD-10-CM | POA: Insufficient documentation

## 2017-01-18 DIAGNOSIS — E11319 Type 2 diabetes mellitus with unspecified diabetic retinopathy without macular edema: Secondary | ICD-10-CM | POA: Diagnosis not present

## 2017-01-18 DIAGNOSIS — Z9842 Cataract extraction status, left eye: Secondary | ICD-10-CM | POA: Diagnosis not present

## 2017-01-18 DIAGNOSIS — R29810 Facial weakness: Secondary | ICD-10-CM | POA: Diagnosis not present

## 2017-01-18 DIAGNOSIS — Z9119 Patient's noncompliance with other medical treatment and regimen: Secondary | ICD-10-CM | POA: Insufficient documentation

## 2017-01-18 DIAGNOSIS — R569 Unspecified convulsions: Secondary | ICD-10-CM | POA: Insufficient documentation

## 2017-01-18 DIAGNOSIS — G4733 Obstructive sleep apnea (adult) (pediatric): Secondary | ICD-10-CM | POA: Diagnosis not present

## 2017-01-18 DIAGNOSIS — Z7982 Long term (current) use of aspirin: Secondary | ICD-10-CM | POA: Insufficient documentation

## 2017-01-18 DIAGNOSIS — F329 Major depressive disorder, single episode, unspecified: Secondary | ICD-10-CM | POA: Insufficient documentation

## 2017-01-18 DIAGNOSIS — E1159 Type 2 diabetes mellitus with other circulatory complications: Secondary | ICD-10-CM | POA: Diagnosis present

## 2017-01-18 DIAGNOSIS — E119 Type 2 diabetes mellitus without complications: Secondary | ICD-10-CM

## 2017-01-18 DIAGNOSIS — E43 Unspecified severe protein-calorie malnutrition: Secondary | ICD-10-CM | POA: Diagnosis not present

## 2017-01-18 DIAGNOSIS — Z8673 Personal history of transient ischemic attack (TIA), and cerebral infarction without residual deficits: Secondary | ICD-10-CM | POA: Diagnosis not present

## 2017-01-18 DIAGNOSIS — Z8249 Family history of ischemic heart disease and other diseases of the circulatory system: Secondary | ICD-10-CM | POA: Insufficient documentation

## 2017-01-18 DIAGNOSIS — N183 Chronic kidney disease, stage 3 unspecified: Secondary | ICD-10-CM | POA: Diagnosis present

## 2017-01-18 DIAGNOSIS — Z888 Allergy status to other drugs, medicaments and biological substances status: Secondary | ICD-10-CM | POA: Insufficient documentation

## 2017-01-18 DIAGNOSIS — Z841 Family history of disorders of kidney and ureter: Secondary | ICD-10-CM | POA: Insufficient documentation

## 2017-01-18 DIAGNOSIS — I69351 Hemiplegia and hemiparesis following cerebral infarction affecting right dominant side: Secondary | ICD-10-CM | POA: Insufficient documentation

## 2017-01-18 DIAGNOSIS — Z833 Family history of diabetes mellitus: Secondary | ICD-10-CM | POA: Insufficient documentation

## 2017-01-18 DIAGNOSIS — Z800 Family history of malignant neoplasm of digestive organs: Secondary | ICD-10-CM | POA: Insufficient documentation

## 2017-01-18 DIAGNOSIS — I129 Hypertensive chronic kidney disease with stage 1 through stage 4 chronic kidney disease, or unspecified chronic kidney disease: Secondary | ICD-10-CM | POA: Diagnosis not present

## 2017-01-18 DIAGNOSIS — Z86010 Personal history of colonic polyps: Secondary | ICD-10-CM | POA: Insufficient documentation

## 2017-01-18 DIAGNOSIS — Z6831 Body mass index (BMI) 31.0-31.9, adult: Secondary | ICD-10-CM | POA: Insufficient documentation

## 2017-01-18 DIAGNOSIS — R55 Syncope and collapse: Secondary | ICD-10-CM | POA: Diagnosis not present

## 2017-01-18 DIAGNOSIS — Z9841 Cataract extraction status, right eye: Secondary | ICD-10-CM | POA: Insufficient documentation

## 2017-01-18 DIAGNOSIS — R634 Abnormal weight loss: Secondary | ICD-10-CM | POA: Diagnosis present

## 2017-01-18 DIAGNOSIS — Z91048 Other nonmedicinal substance allergy status: Secondary | ICD-10-CM | POA: Insufficient documentation

## 2017-01-18 DIAGNOSIS — F4310 Post-traumatic stress disorder, unspecified: Secondary | ICD-10-CM | POA: Diagnosis not present

## 2017-01-18 DIAGNOSIS — I493 Ventricular premature depolarization: Secondary | ICD-10-CM | POA: Insufficient documentation

## 2017-01-18 DIAGNOSIS — E1122 Type 2 diabetes mellitus with diabetic chronic kidney disease: Secondary | ICD-10-CM | POA: Diagnosis not present

## 2017-01-18 DIAGNOSIS — I10 Essential (primary) hypertension: Secondary | ICD-10-CM | POA: Diagnosis not present

## 2017-01-18 DIAGNOSIS — Z9889 Other specified postprocedural states: Secondary | ICD-10-CM | POA: Insufficient documentation

## 2017-01-18 DIAGNOSIS — E1140 Type 2 diabetes mellitus with diabetic neuropathy, unspecified: Secondary | ICD-10-CM | POA: Diagnosis not present

## 2017-01-18 DIAGNOSIS — R1310 Dysphagia, unspecified: Secondary | ICD-10-CM | POA: Diagnosis not present

## 2017-01-18 DIAGNOSIS — Z7401 Bed confinement status: Secondary | ICD-10-CM | POA: Insufficient documentation

## 2017-01-18 DIAGNOSIS — Z820 Family history of epilepsy and other diseases of the nervous system: Secondary | ICD-10-CM | POA: Insufficient documentation

## 2017-01-18 DIAGNOSIS — Z836 Family history of other diseases of the respiratory system: Secondary | ICD-10-CM | POA: Insufficient documentation

## 2017-01-18 DIAGNOSIS — Z794 Long term (current) use of insulin: Secondary | ICD-10-CM | POA: Insufficient documentation

## 2017-01-18 DIAGNOSIS — I951 Orthostatic hypotension: Secondary | ICD-10-CM | POA: Diagnosis present

## 2017-01-18 DIAGNOSIS — Z7902 Long term (current) use of antithrombotics/antiplatelets: Secondary | ICD-10-CM | POA: Diagnosis not present

## 2017-01-18 DIAGNOSIS — M109 Gout, unspecified: Secondary | ICD-10-CM | POA: Insufficient documentation

## 2017-01-18 DIAGNOSIS — E784 Other hyperlipidemia: Secondary | ICD-10-CM | POA: Insufficient documentation

## 2017-01-18 DIAGNOSIS — Z961 Presence of intraocular lens: Secondary | ICD-10-CM | POA: Insufficient documentation

## 2017-01-18 LAB — I-STAT VENOUS BLOOD GAS, ED
ACID-BASE EXCESS: 4 mmol/L — AB (ref 0.0–2.0)
Bicarbonate: 28.3 mmol/L — ABNORMAL HIGH (ref 20.0–28.0)
O2 Saturation: 56 %
PH VEN: 7.433 — AB (ref 7.250–7.430)
TCO2: 30 mmol/L (ref 0–100)
pCO2, Ven: 42.3 mmHg — ABNORMAL LOW (ref 44.0–60.0)
pO2, Ven: 29 mmHg — CL (ref 32.0–45.0)

## 2017-01-18 LAB — COMPREHENSIVE METABOLIC PANEL
ALT: 16 U/L — AB (ref 17–63)
AST: 21 U/L (ref 15–41)
Albumin: 3.2 g/dL — ABNORMAL LOW (ref 3.5–5.0)
Alkaline Phosphatase: 53 U/L (ref 38–126)
Anion gap: 10 (ref 5–15)
BILIRUBIN TOTAL: 0.6 mg/dL (ref 0.3–1.2)
BUN: 25 mg/dL — AB (ref 6–20)
CO2: 26 mmol/L (ref 22–32)
CREATININE: 2.2 mg/dL — AB (ref 0.61–1.24)
Calcium: 9.4 mg/dL (ref 8.9–10.3)
Chloride: 105 mmol/L (ref 101–111)
GFR calc Af Amer: 33 mL/min — ABNORMAL LOW (ref >60)
GFR, EST NON AFRICAN AMERICAN: 29 mL/min — AB (ref >60)
Glucose, Bld: 124 mg/dL — ABNORMAL HIGH (ref 65–99)
Potassium: 3.4 mmol/L — ABNORMAL LOW (ref 3.5–5.1)
Sodium: 141 mmol/L (ref 135–145)
TOTAL PROTEIN: 6.8 g/dL (ref 6.5–8.1)

## 2017-01-18 LAB — URINALYSIS, ROUTINE W REFLEX MICROSCOPIC
BACTERIA UA: NONE SEEN
BILIRUBIN URINE: NEGATIVE
GLUCOSE, UA: NEGATIVE mg/dL
Ketones, ur: NEGATIVE mg/dL
LEUKOCYTES UA: NEGATIVE
NITRITE: NEGATIVE
PROTEIN: 100 mg/dL — AB
SPECIFIC GRAVITY, URINE: 1.018 (ref 1.005–1.030)
pH: 5 (ref 5.0–8.0)

## 2017-01-18 LAB — GLUCOSE, CAPILLARY: Glucose-Capillary: 173 mg/dL — ABNORMAL HIGH (ref 65–99)

## 2017-01-18 LAB — CBC
HEMATOCRIT: 34.6 % — AB (ref 39.0–52.0)
HEMOGLOBIN: 12.1 g/dL — AB (ref 13.0–17.0)
MCH: 30.6 pg (ref 26.0–34.0)
MCHC: 35 g/dL (ref 30.0–36.0)
MCV: 87.6 fL (ref 78.0–100.0)
Platelets: 109 10*3/uL — ABNORMAL LOW (ref 150–400)
RBC: 3.95 MIL/uL — AB (ref 4.22–5.81)
RDW: 12.8 % (ref 11.5–15.5)
WBC: 5.7 10*3/uL (ref 4.0–10.5)

## 2017-01-18 LAB — I-STAT TROPONIN, ED: TROPONIN I, POC: 0.02 ng/mL (ref 0.00–0.08)

## 2017-01-18 LAB — CBG MONITORING, ED: GLUCOSE-CAPILLARY: 120 mg/dL — AB (ref 65–99)

## 2017-01-18 LAB — I-STAT CG4 LACTIC ACID, ED: LACTIC ACID, VENOUS: 1.85 mmol/L (ref 0.5–1.9)

## 2017-01-18 LAB — CK: Total CK: 98 U/L (ref 49–397)

## 2017-01-18 LAB — VALPROIC ACID LEVEL: Valproic Acid Lvl: 71 ug/mL (ref 50.0–100.0)

## 2017-01-18 LAB — BRAIN NATRIURETIC PEPTIDE: B NATRIURETIC PEPTIDE 5: 145.7 pg/mL — AB (ref 0.0–100.0)

## 2017-01-18 LAB — MAGNESIUM: MAGNESIUM: 1.7 mg/dL (ref 1.7–2.4)

## 2017-01-18 LAB — TROPONIN I: TROPONIN I: 0.03 ng/mL — AB (ref <0.03)

## 2017-01-18 LAB — ETHANOL

## 2017-01-18 LAB — D-DIMER, QUANTITATIVE: D-Dimer, Quant: 0.68 ug/mL-FEU — ABNORMAL HIGH (ref 0.00–0.50)

## 2017-01-18 MED ORDER — CLONIDINE HCL 0.2 MG/24HR TD PTWK
0.2000 mg | MEDICATED_PATCH | TRANSDERMAL | Status: DC
Start: 2017-01-18 — End: 2017-01-20
  Administered 2017-01-19: 0.2 mg via TRANSDERMAL
  Filled 2017-01-18: qty 1

## 2017-01-18 MED ORDER — ALBUTEROL SULFATE (2.5 MG/3ML) 0.083% IN NEBU: 2.5000 mg | INHALATION_SOLUTION | Freq: Four times a day (QID) | RESPIRATORY_TRACT | Status: DC | PRN

## 2017-01-18 MED ORDER — TAMSULOSIN HCL 0.4 MG PO CAPS
0.8000 mg | ORAL_CAPSULE | Freq: Every day | ORAL | Status: DC
  Administered 2017-01-19 (×2): 0.8 mg via ORAL
  Filled 2017-01-18 (×2): qty 2

## 2017-01-18 MED ORDER — LOSARTAN POTASSIUM 50 MG PO TABS
100.0000 mg | ORAL_TABLET | Freq: Every day | ORAL | Status: DC
  Administered 2017-01-19 – 2017-01-20 (×2): 100 mg via ORAL
  Filled 2017-01-18 (×2): qty 2

## 2017-01-18 MED ORDER — ACETAMINOPHEN 500 MG PO TABS: 500.0000 mg | ORAL_TABLET | Freq: Four times a day (QID) | ORAL | Status: DC | PRN

## 2017-01-18 MED ORDER — SODIUM CHLORIDE 0.9 % IV SOLN
INTRAVENOUS | Status: DC
  Administered 2017-01-18 – 2017-01-19 (×2): via INTRAVENOUS

## 2017-01-18 MED ORDER — POTASSIUM CHLORIDE CRYS ER 20 MEQ PO TBCR
20.0000 meq | EXTENDED_RELEASE_TABLET | Freq: Once | ORAL | Status: AC
Start: 2017-01-18 — End: 2017-01-18
  Administered 2017-01-18: 20 meq via ORAL
  Filled 2017-01-18: qty 1

## 2017-01-18 MED ORDER — CARVEDILOL 25 MG PO TABS
25.0000 mg | ORAL_TABLET | Freq: Two times a day (BID) | ORAL | Status: DC
  Administered 2017-01-18 – 2017-01-19 (×2): 25 mg via ORAL
  Filled 2017-01-18: qty 1
  Filled 2017-01-18: qty 2
  Filled 2017-01-18: qty 1

## 2017-01-18 MED ORDER — INSULIN ASPART 100 UNIT/ML ~~LOC~~ SOLN
0.0000 [IU] | Freq: Three times a day (TID) | SUBCUTANEOUS | Status: DC
  Administered 2017-01-19: 1 [IU] via SUBCUTANEOUS

## 2017-01-18 MED ORDER — ENOXAPARIN SODIUM 40 MG/0.4ML ~~LOC~~ SOLN
40.0000 mg | SUBCUTANEOUS | Status: DC
  Administered 2017-01-19 (×2): 40 mg via SUBCUTANEOUS
  Filled 2017-01-18 (×3): qty 0.4

## 2017-01-18 MED ORDER — SODIUM CHLORIDE 0.9 % IV SOLN: INTRAVENOUS | Status: DC

## 2017-01-18 MED ORDER — CHLORTHALIDONE 25 MG PO TABS
25.0000 mg | ORAL_TABLET | Freq: Every day | ORAL | Status: DC
  Administered 2017-01-19 – 2017-01-20 (×2): 25 mg via ORAL
  Filled 2017-01-18 (×2): qty 1

## 2017-01-18 MED ORDER — ASPIRIN EC 81 MG PO TBEC
81.0000 mg | DELAYED_RELEASE_TABLET | Freq: Every day | ORAL | Status: DC
  Administered 2017-01-19 – 2017-01-20 (×2): 81 mg via ORAL
  Filled 2017-01-18 (×2): qty 1

## 2017-01-18 MED ORDER — INSULIN GLARGINE 100 UNIT/ML ~~LOC~~ SOLN
10.0000 [IU] | Freq: Every day | SUBCUTANEOUS | Status: DC
  Administered 2017-01-19: 10 [IU] via SUBCUTANEOUS
  Filled 2017-01-18 (×2): qty 0.1

## 2017-01-18 MED ORDER — LATANOPROST 0.005 % OP SOLN
1.0000 [drp] | Freq: Every day | OPHTHALMIC | Status: DC
  Administered 2017-01-19 (×2): 1 [drp] via OPHTHALMIC
  Filled 2017-01-18: qty 2.5

## 2017-01-18 MED ORDER — DIVALPROEX SODIUM 500 MG PO DR TAB
500.0000 mg | DELAYED_RELEASE_TABLET | Freq: Three times a day (TID) | ORAL | Status: DC
  Administered 2017-01-18 – 2017-01-20 (×5): 500 mg via ORAL
  Filled 2017-01-18: qty 2
  Filled 2017-01-18 (×4): qty 1

## 2017-01-18 MED ORDER — CLOPIDOGREL BISULFATE 75 MG PO TABS
75.0000 mg | ORAL_TABLET | Freq: Every day | ORAL | Status: DC
  Administered 2017-01-19: 75 mg via ORAL
  Filled 2017-01-18 (×2): qty 1

## 2017-01-18 NOTE — ED Triage Notes (Addendum)
Pt from home via EMS s/p syncopal episode. Per EMS, pt has been seen frequently for stroke like sx, his last visit on 5/7. Per EMS, on arrival pt was seated upright with his head dropped, drooling. Pt was also noted to be diaphoretic and apneic, with no radial pulses present. Upon moving pt to the floor, pt regained consciousness with BP WDL, pulses 2+. CBG 158. VS 150/80, HR 70 NSR, 12-lead unremarkable per EMS, 96% on RA. Pt A&Ox4, grips equal and strong, facial symmetry noted. PERRLA. Pt speaking in complete sentences.

## 2017-01-18 NOTE — ED Notes (Signed)
PIV attempted x2 without success

## 2017-01-18 NOTE — ED Notes (Signed)
Communicated critical lab values to MD.

## 2017-01-18 NOTE — Consult Note (Signed)
Admission H&P    Chief Complaint: Episode of loss of consciousness.  HPI: Cody Silva is an 71 y.o. male history of stroke with residual right hemiparesis, diabetes mellitus, hypertension, PTSD, seizures as well as episodes of loss of consciousness of unclear etiology, brought to the ED for evaluation following an episode of loss of consciousness at home. Patient was undergoing physical therapy at the time. He was noted to become less responsive with eyes closed and slurred speech. Residual facial weakness from previous stroke became more pronounced and patient loss consciousness. No tonic clonic activity was described. At one point he appeared to have no pulse per firefighters, but the pulse was detectable once they laid him on the floor. No CPR was initiated. No postictal period was described. Patient was incoherent for shortly after regaining consciousness. This is similar to previous spells that he has experienced, according to his wife. There was no staring spell prior to losing consciousness, and she did not feel that he had experienced a seizure. He has had Loop monitor recording. No significant cardiac dysrhythmia was determined. He was recently hospitalized for similar complaints. He is currently on aspirin and Plavix for antiplatelet therapy. There is also some concern that he may have early manifestations of motor neuron disease with atrophy of intrinsic hand muscles and dysphagia. Dysphagia has worsened since discharge one week ago.  Past Medical History:  Diagnosis Date  . Anemia   . Arthritis    "right arm, right ankle, right side" (10/30/2014)  . Colon polyps   . Diabetic neuropathy ([place])   . Diabetic retinopathy ([place])   . H/O agent Orange exposure   . Headache    "@ least 3 times/wk" (10/30/2014)  . History of blood transfusion 10/30/2014   hematochezia  . History of gout   . Hypertension   . OSA on CPAP    "suppose to wear mask; I've got a call in for an equipment change"  (10/30/2014)  . PTSD (post-traumatic stress disorder)    "service related"  . Seizures ([place])   . Stroke [facility]) 2014   left extremity deficits; facial left  . Type II diabetes mellitus ([place])     Past Surgical History:  Procedure Laterality Date  . BONE GRAFT HIP ILIAC CREST Left ~ 1976  . CATARACT EXTRACTION W/ INTRAOCULAR LENS  IMPLANT, BILATERAL Bilateral 2014-2015  . EP IMPLANTABLE DEVICE N/A 12/24/2015   Procedure: Loop Recorder Insertion;  Surgeon: Thompson Grayer, MD;  Location: [place] [facility];  Service: Cardiovascular;  Laterality: N/A;  . TUMOR REMOVAL Right ~ 1976   "arm; had to take bone left hip to add to the repair"    Family History  Problem Relation Age of Onset  . Diabetes Mother   . Breast cancer Mother   . Heart attack Mother        CABG - Age 70  . Kidney disease Mother   . Stroke Brother   . Lung disease Father   . Neurofibromatosis Maternal Uncle   . Throat cancer Brother   . Hypertension Brother    Social History:  reports that he has never smoked. He has never used smokeless tobacco. He reports that he drinks about 0.6 oz of alcohol per week . He reports that he does not use drugs.  Allergies:  Allergies  Allergen Reactions  . Metformin And Related Diarrhea  . Tape Rash    Medications: Preadmission medications were reviewed by me.  ROS: History obtained from spouse and the  patient  General ROS: negative for - chills, fatigue, fever, night sweats, weight gain or weight loss Psychological ROS: negative for - behavioral disorder, hallucinations, memory difficulties, mood swings or suicidal ideation Ophthalmic ROS: negative for - blurry vision, double vision, eye pain or loss of vision ENT ROS: Progressive dysphagia as noted above Allergy and Immunology ROS: negative for - hives or itchy/watery eyes Hematological and Lymphatic ROS: negative for - bleeding problems, bruising or swollen lymph nodes Endocrine ROS: negative for - galactorrhea, hair  pattern changes, polydipsia/polyuria or temperature intolerance Respiratory ROS: negative for - cough, hemoptysis, shortness of breath or wheezing Cardiovascular ROS: negative for - chest pain, dyspnea on exertion, edema or irregular heartbeat Gastrointestinal ROS: Progressive dysphagia as noted above incontinence Genito-Urinary ROS: negative for - dysuria, hematuria, incontinence or urinary frequency/urgency Musculoskeletal ROS: Intrinsic hand muscle atrophy Neurological ROS: as noted in HPI Dermatological ROS: negative for rash and skin lesion changes  Physical Examination: Blood pressure (!) 184/90, pulse (!) 59, temperature 97.6 F (36.4 C), temperature source Oral, resp. rate 15, height _0  (1.93 m), weight 85.7 kg (189 lb), SpO2 97 %.  HEENT-  Normocephalic, no lesions, without obvious abnormality.  Normal external eye and conjunctiva.  Normal TM's bilaterally.  Normal auditory canals and external ears. Normal external nose, mucus membranes and septum.  Normal pharynx. Neck supple with no masses, nodes, nodules or enlargement. Cardiovascular - regular rate and rhythm, S1, S2 normal, no murmur, click, rub or gallop Lungs - chest clear, no wheezing, rales, normal symmetric air entry Abdomen - soft, non-tender; bowel sounds normal; no masses,  no organomegaly Extremities - no joint deformities, effusion, or inflammation  Neurologic Examination: Mental Status: Alert, oriented, no acute distress.  Speech minimally slurred without evidence of aphasia. Able to follow commands without difficulty. Cranial Nerves: II-intact bilaterally but difficulty with finger counting accurately l. III/IV/VI-Pupils were equal and reacted. Extraocular movements were full and conjugate.    V/VII-no facial numbness; mild right lower facial weakness. VIII-normal. X-mild dysarthria. XII-equivocal atrophy of tongue bilaterally; no clear fasciculations seen. Motor: 5/5 bilaterally with normal tone and bulk  except for bilateral intrinsic hand muscle weakness and atrophy, right greater than left. Sensory: Normal throughout. Deep Tendon Reflexes: 2+ and symmetric. Plantars: Mute bilaterally Cerebellar: Normal finger-to-nose testing.   Results for orders placed or performed during the hospital encounter of 01/18/17 (from the past 48 hour(s))  CBC     Status: Abnormal   Collection Time: 01/18/17  7:30 PM  Result Value Ref Range   WBC 5.7 4.0 - 10.5 K/uL   RBC 3.95 (L) 4.22 - 5.81 MIL/uL   Hemoglobin 12.1 (L) 13.0 - 17.0 g/dL   HCT 34.6 (L) 39.0 - 52.0 %   MCV 87.6 78.0 - 100.0 fL   MCH 30.6 26.0 - 34.0 pg   MCHC 35.0 30.0 - 36.0 g/dL   RDW 12.8 11.5 - 15.5 %   Platelets 109 (L) 150 - 400 K/uL    Comment: REPEATED TO VERIFY SPECIMEN CHECKED FOR CLOTS PLATELETS APPEAR DECREASED PLATELET COUNT CONFIRMED BY SMEAR   CBG monitoring, ED     Status: Abnormal   Collection Time: 01/18/17  7:30 PM  Result Value Ref Range   Glucose-Capillary 120 (H) 65 - 99 mg/dL  Comprehensive metabolic panel     Status: Abnormal   Collection Time: 01/18/17  7:30 PM  Result Value Ref Range   Sodium 141 135 - 145 mmol/L   Potassium 3.4 (L) 3.5 - 5.1 mmol/L  Chloride 105 101 - 111 mmol/L   CO2 26 22 - 32 mmol/L   Glucose, Bld 124 (H) 65 - 99 mg/dL   BUN 25 (H) 6 - 20 mg/dL   Creatinine, Ser 2.20 (H) 0.61 - 1.24 mg/dL   Calcium 9.4 8.9 - 10.3 mg/dL   Total Protein 6.8 6.5 - 8.1 g/dL   Albumin 3.2 (L) 3.5 - 5.0 g/dL   AST 21 15 - 41 U/L   ALT 16 (L) 17 - 63 U/L   Alkaline Phosphatase 53 38 - 126 U/L   Total Bilirubin 0.6 0.3 - 1.2 mg/dL   GFR calc non Af Amer 29 (L) >60 mL/min   GFR calc Af Amer 33 (L) >60 mL/min    Comment: (NOTE) The eGFR has been calculated using the CKD EPI equation. This calculation has not been validated in all clinical situations. eGFR's persistently <60 mL/min signify possible Chronic Kidney Disease.    Anion gap 10 5 - 15  Brain natriuretic peptide     Status: Abnormal    Collection Time: 01/18/17  7:30 PM  Result Value Ref Range   B Natriuretic Peptide 145.7 (H) 0.0 - 100.0 pg/mL  D-dimer, quantitative (not at [facility])     Status: Abnormal   Collection Time: 01/18/17  7:30 PM  Result Value Ref Range   D-Dimer, Quant 0.68 (H) 0.00 - 0.50 ug/mL-FEU    Comment: (NOTE) At the manufacturer cut-off of 0.50 ug/mL FEU, this assay has been documented to exclude PE with a sensitivity and negative predictive value of 97 to 99%.  At this time, this assay has not been approved by the FDA to exclude DVT/VTE. Results should be correlated with clinical presentation.   Magnesium     Status: None   Collection Time: 01/18/17  7:30 PM  Result Value Ref Range   Magnesium 1.7 1.7 - 2.4 mg/dL  I-Stat Troponin, ED - 0, 3, 6 hours (not at [facility])     Status: None   Collection Time: 01/18/17  7:39 PM  Result Value Ref Range   Troponin i, poc 0.02 0.00 - 0.08 ng/mL   Comment 3            Comment: Due to the release kinetics of cTnI, a negative result within the first hours of the onset of symptoms does not rule out myocardial infarction with certainty. If myocardial infarction is still suspected, repeat the test at appropriate intervals.   I-Stat Venous Blood Gas, ED (order at [facility] and [facility] only)     Status: Abnormal   Collection Time: 01/18/17  7:40 PM  Result Value Ref Range   pH, Ven 7.433 (H) 7.250 - 7.430   pCO2, Ven 42.3 (L) 44.0 - 60.0 mmHg   pO2, Ven 29.0 (LL) 32.0 - 45.0 mmHg   Bicarbonate 28.3 (H) 20.0 - 28.0 mmol/L   TCO2 30 0 - 100 mmol/L   O2 Saturation 56.0 %   Acid-Base Excess 4.0 (H) 0.0 - 2.0 mmol/L   Patient temperature 37.0 C    Sample type VENOUS    Comment NOTIFIED PHYSICIAN   I-Stat CG4 Lactic Acid, ED     Status: None   Collection Time: 01/18/17  7:42 PM  Result Value Ref Range   Lactic Acid, Venous 1.85 0.5 - 1.9 mmol/L  Valproic acid level     Status: None   Collection Time: 01/18/17  8:54 PM  Result Value Ref Range   Valproic Acid Lvl 71 50.0  - 100.0  ug/mL  Urinalysis, Routine w reflex microscopic     Status: Abnormal   Collection Time: 01/18/17  9:05 PM  Result Value Ref Range   Color, Urine YELLOW YELLOW   APPearance CLEAR CLEAR   Specific Gravity, Urine 1.018 1.005 - 1.030   pH 5.0 5.0 - 8.0   Glucose, UA NEGATIVE NEGATIVE mg/dL   Hgb urine dipstick SMALL (A) NEGATIVE   Bilirubin Urine NEGATIVE NEGATIVE   Ketones, ur NEGATIVE NEGATIVE mg/dL   Protein, ur 100 (A) NEGATIVE mg/dL   Nitrite NEGATIVE NEGATIVE   Leukocytes, UA NEGATIVE NEGATIVE   RBC / HPF 0-5 0 - 5 RBC/hpf   WBC, UA 0-5 0 - 5 WBC/hpf   Bacteria, UA NONE SEEN NONE SEEN   Squamous Epithelial / LPF 0-5 (A) NONE SEEN   Mucous PRESENT    Hyaline Casts, UA PRESENT    Dg Chest 2 View  Result Date: 01/18/2017 CLINICAL DATA:  Syncope EXAM: CHEST  2 VIEW COMPARISON:  09/10/2016 FINDINGS: Mild cardiomegaly without overt failure. No focal consolidation or pleural effusion. No pneumothorax. Electronic device over the left chest. IMPRESSION: No active cardiopulmonary disease. Electronically Signed   By: Donavan Foil M.D.   On: 01/18/2017 18:24    Assessment/Plan 71 year old man with multiple medical problems as outlined above as well as history of previous stroke and recurrent episodes of loss of consciousness, presenting following an episode of probable syncope. No seizure activity was witnessed and there was no apparent postictal confusion. Depakote level was within normal range. Patient is also presenting with progressive dysphagia as well as disproportionate intrinsic hand muscle atrophy bilaterally. Etiology is unclear. Motor neuron disease will need to be ruled out, although weakness and atrophy of hands may be secondary to diabetic neuropathy.  Recommendations: 1. Agree with admission for telemetry monitoring as planned 2. MRI of the brain without contrast to rule out recurrent acute stroke 3. Continue aspirin and Plavix daily for antiplatelet therapy 4.  Continue gabapentin as well as valproic acid at current doses 5. Outpatient workup with nerve conduction studies and EMG to rule out peripheral neuropathy as well as rule out possible motor neuron disease. 6. No further acute neurology intervention is indicated if MRI of the brain is unremarkable  C.R. Nicole Kindred, [place] [facility] [phone]  01/18/2017, 11:09 PM

## 2017-01-18 NOTE — ED Notes (Signed)
Family at bedside. 

## 2017-01-18 NOTE — ED Notes (Addendum)
Attempted to start IV and obtain labs x 2 with no success. Will have another nurse attempt to start line and get labs.

## 2017-01-18 NOTE — ED Notes (Signed)
Per phlebotomy, will come to attempt to draw blood labs.

## 2017-01-18 NOTE — ED Notes (Signed)
IV team at bedside 

## 2017-01-18 NOTE — ED Notes (Signed)
Pt loop recorder interrogated by this nurse and sent using [employer] device.

## 2017-01-18 NOTE — ED Notes (Signed)
Per IV team, unable to draw blood. Will attempt to reach phlebotomy again to see if a phlebotomist can attempt to try and draw labs.

## 2017-01-18 NOTE — ED Notes (Signed)
Per MD, pt OK to eat & drink. Given ginger ale per pt request.

## 2017-01-18 NOTE — ED Notes (Signed)
ED Provider at bedside. 

## 2017-01-18 NOTE — ED Provider Notes (Signed)
[facility] DEPT Provider Note   CSN: [id number] Arrival date & time: 01/18/17  1616     History   Chief Complaint Chief Complaint  Patient presents with  . Loss of Consciousness    HPI Cody Silva is a 71 y.o. male.  HPI  Has been having episodes since November Physical therapist was there, they were talking and he then said he was feeling badly and needed to lay down.  He started sweating.  Therapist took blood pressure, noticed left facial droop, tried to talk but slurred speech, blood pressure was dropping--but noted it was in 120s/80s when asked. When EMS arrived, he had stopped breathing, was passed out, could not feel radial pulses. Placed him on floor and he began to come around.  Looked like he had labored breathing.  Lasted a few minute. More fatigued after episode. Similar to prior episodes but dyspnea new. Family asking about GI work up given weight loss, swallowing problems. Had swallow eval in hospital previously which was normal.  Not swallowing appropriately, lost 10lb since 1 week ago. Also notes general weakness.   Past Medical History:  Diagnosis Date  . Anemia   . Arthritis    "right arm, right ankle, right side" (10/30/2014)  . Colon polyps   . Diabetic neuropathy ([place])   . Diabetic retinopathy ([place])   . H/O agent Orange exposure   . Headache    "@ least 3 times/wk" (10/30/2014)  . History of blood transfusion 10/30/2014   hematochezia  . History of gout   . Hypertension   . OSA on CPAP    "suppose to wear mask; I've got a call in for an equipment change" (10/30/2014)  . PTSD (post-traumatic stress disorder)    "service related"  . Seizures ([place])   . Stroke [facility]) 2014   left extremity deficits; facial left  . Type II diabetes mellitus [facility])     Patient Active Problem List   Diagnosis Date Noted  . Altered mental status 01/09/2017  . Facial droop 01/09/2017  . Cerebral thrombosis with cerebral infarction 01/09/2017  . Seizures ([place])   .  Other hyperlipidemia   . OSA (obstructive sleep apnea) 09/12/2016  . Weight loss 09/12/2016  . Malnutrition of moderate degree 09/11/2016  . Syncope 09/10/2016  . H/O agent Orange exposure 12/24/2015  . Type 2 diabetes with nephropathy ([place])   . Near syncope 12/22/2015  . History of TIAs 09/14/2015  . CKD stage 3 due to type 2 diabetes mellitus ([place]) 09/14/2015  . Acute lower GI bleeding 10/30/2014  . History of colonic polyps 10/30/2014  . Depression 10/30/2014  . Symptomatic anemia 10/30/2014  . AKI (acute kidney injury) ([place]) 10/30/2014  . Ataxic gait 09/21/2014  . History of CVA (cerebrovascular accident) 02/22/2013  . Abnormal brain scan 02/21/2013  . Dizziness 02/21/2013  . Weakness 02/21/2013  . Diabetes mellitus ([place]) 02/21/2013  . Hypertension 02/21/2013    Past Surgical History:  Procedure Laterality Date  . BONE GRAFT HIP ILIAC CREST Left ~ 1976  . CATARACT EXTRACTION W/ INTRAOCULAR LENS  IMPLANT, BILATERAL Bilateral 2014-2015  . EP IMPLANTABLE DEVICE N/A 12/24/2015   Procedure: Loop Recorder Insertion;  Surgeon: Thompson Grayer, MD;  Location: [facility];  Service: Cardiovascular;  Laterality: N/A;  . TUMOR REMOVAL Right ~ 1976   "arm; had to take bone left hip to add to the repair"       Home Medications    Prior to Admission medications   Medication  Sig Start Date End Date Taking? Authorizing Provider  acetaminophen (TYLENOL) 500 MG tablet Take 1 tablet (500 mg total) by mouth every 6 (six) hours as needed for mild pain, moderate pain, fever or headache. 11/01/14  Yes Rabbani, Marjan, MD  albuterol (PROVENTIL HFA;VENTOLIN HFA) 108 (90 BASE) MCG/ACT inhaler Inhale 2 puffs into the lungs every 6 (six) hours as needed for wheezing.   Yes [provider]  aspirin EC 81 MG EC tablet Take 1 tablet (81 mg total) by mouth daily. 01/11/17  Yes Molt, Bethany, DO  atorvastatin (LIPITOR) 20 MG tablet Take 1 tablet (20 mg total) by mouth daily at 6 PM. 09/12/16   Yes Asencion Partridge, MD  carvedilol (COREG) 25 MG tablet Take 25 mg by mouth 2 (two) times daily. Reported on 09/14/2015   Yes [provider]  cetirizine (ZYRTEC) 10 MG tablet Take 10 mg by mouth at bedtime as needed for allergies.   Yes [provider]  chlorthalidone (HYGROTON) 25 MG tablet Take 25 mg by mouth daily.   Yes [provider]  cloNIDine (CATAPRES - DOSED IN MG/24 HR) 0.2 mg/24hr patch Place 0.2 mg onto the skin once a week.   Yes [provider]  clopidogrel (PLAVIX) 75 MG tablet Take 1 tablet (75 mg total) by mouth at bedtime. 11/08/14  Yes Juluis Mire, MD  divalproex (DEPAKOTE) 500 MG DR tablet Take 1 tablet (500 mg total) by mouth 3 (three) times daily. 01/03/17 02/02/17 Yes Jenny Reichmann, MD  feeding supplement, ENSURE ENLIVE, (ENSURE ENLIVE) LIQD Take 237 mLs by mouth 2 (two) times daily after a meal. 09/12/16  Yes Asencion Partridge, MD  fluticasone [facility]) 50 MCG/ACT nasal spray Place 2 sprays into both nostrils daily as needed for allergies.    Yes [provider]  gabapentin (NEURONTIN) 300 MG capsule Take 300 mg by mouth at bedtime.    Yes [provider]  insulin aspart (NOVOLOG) 100 UNIT/ML injection Inject 5-15 Units into the skin 3 (three) times daily with meals. Per sliding scale   Yes [provider]  insulin glargine (LANTUS) 100 UNIT/ML injection Inject 20 Units into the skin at bedtime.    Yes [provider]  latanoprost (XALATAN) 0.005 % ophthalmic solution Place 1 drop into both eyes at bedtime.    Yes [provider]  losartan (COZAAR) 100 MG tablet Take 100 mg by mouth daily.   Yes [provider]  polyvinyl alcohol (LIQUIFILM TEARS) 1.4 % ophthalmic solution Place 1 drop into both eyes 5 (five) times daily as needed (for dry eyes).   Yes [provider]  potassium chloride (K-DUR,KLOR-CON) 10 MEQ tablet Take 10 mEq by mouth 2 (two) times daily.   Yes [provider]  sertraline (ZOLOFT) 100 MG tablet Take 100 mg by mouth daily.   Yes [provider]  Skin Protectants, Misc. (EUCERIN) cream Apply 1 application topically daily. FEET AND LEGS   Yes [provider]  tamsulosin (FLOMAX) 0.4 MG CAPS capsule Take 0.8 mg by mouth at bedtime.   Yes [provider]  fluocinonide cream (LIDEX) 6.62 % Apply 1 application topically daily.     [provider]  Ginkgo Biloba (GNP GINGKO BILOBA EXTRACT PO) Take 1 capsule by mouth 2 (two) times a week.     [provider]  Multiple Vitamins-Minerals (MULTIVITAMIN PO) Take 1 tablet by mouth daily.    [provider]    Family History Family History  Problem Relation Age  of Onset  . Diabetes Mother   . Breast cancer Mother   . Heart attack Mother        CABG - Age 29  . Kidney disease Mother   . Stroke Brother   . Lung disease Father   . Neurofibromatosis Maternal Uncle   . Throat cancer Brother   . Hypertension Brother     Social History Social History  Substance Use Topics  . Smoking status: Never Smoker  . Smokeless tobacco: Never Used  . Alcohol use 0.6 oz/week    1 Cans of beer per week     Comment: 10/30/2014 "might have a beer a couple times/yr"     Allergies   Metformin and related and Tape   Review of Systems Review of Systems  Constitutional: Positive for appetite change, diaphoresis, fatigue and unexpected weight change. Negative for fever.  HENT: Positive for rhinorrhea.   Respiratory: Positive for shortness of breath. Negative for cough.   Cardiovascular: Negative for chest pain.  Gastrointestinal: Positive for diarrhea (only with eating--maybe once a day). Negative for abdominal pain, nausea and vomiting.  Genitourinary: Negative for difficulty urinating and dysuria.  Neurological: Positive for syncope, facial asymmetry, speech difficulty and weakness (generalized). Negative for numbness and headaches. Light-headedness: not sure  said "weak" all over.     Physical Exam Updated Vital Signs BP (!) 153/72 (BP Location: Right Arm)   Pulse (!) 56   Temp 98.6 F (37 C) (Oral)   Resp 14   Ht 6\' 1"  (1.854 m)   Wt 242 lb 1.6 oz (109.8 kg)   SpO2 100%   BMI 31.94 kg/m   Physical Exam  Constitutional: He is oriented to person, place, and time. He appears well-developed and well-nourished. No distress.  fatigued  HENT:  Head: Normocephalic and atraumatic.  Eyes: Conjunctivae and EOM are normal.  Neck: Normal range of motion.  Cardiovascular: Normal rate, regular rhythm, normal heart sounds and intact distal pulses.  Exam reveals no gallop and no friction rub.   No murmur heard. Pulmonary/Chest: Effort normal and breath sounds normal. No respiratory distress. He has no wheezes. He has no rales.  Abdominal: Soft. He exhibits no distension. There is no tenderness. There is no guarding.  Musculoskeletal: He exhibits no edema.  Neurological: He is alert and oriented to person, place, and time. He has normal strength. No cranial nerve deficit or sensory deficit. Coordination normal. GCS eye subscore is 4. GCS verbal subscore is 5. GCS motor subscore is 6.  Skin: Skin is warm and dry. He is not diaphoretic.  Nursing note and vitals reviewed.    ED Treatments / Results  Labs (all labs ordered are listed, but only abnormal results are displayed) Labs Reviewed  CBC - Abnormal; Notable for the following:       Result Value   RBC 3.95 (*)    Hemoglobin 12.1 (*)    HCT 34.6 (*)    Platelets 109 (*)    All other components within normal limits  URINALYSIS, ROUTINE W REFLEX MICROSCOPIC - Abnormal; Notable for the following:    Hgb urine dipstick SMALL (*)    Protein, ur 100 (*)    Squamous Epithelial / LPF 0-5 (*)    All other components within normal limits  COMPREHENSIVE METABOLIC PANEL - Abnormal; Notable for the following:    Potassium 3.4 (*)    Glucose, Bld 124 (*)    BUN 25 (*)    Creatinine, Ser 2.20 (*)  Albumin 3.2 (*)    ALT 16 (*)    GFR calc non Af Amer 29 (*)    GFR calc Af Amer 33 (*)    All other components within normal limits  BRAIN NATRIURETIC PEPTIDE - Abnormal; Notable for the following:    B Natriuretic Peptide 145.7 (*)    All other components within normal limits  D-DIMER, QUANTITATIVE (NOT AT [facility]) - Abnormal; Notable for the following:    D-Dimer, Quant 0.68 (*)    All other components within normal limits  BASIC METABOLIC PANEL - Abnormal; Notable for the following:    Potassium 3.3 (*)    Glucose, Bld 110 (*)    BUN 24 (*)    Creatinine, Ser 2.14 (*)    GFR calc non Af Amer 30 (*)    GFR calc Af Amer 35 (*)    All other components within normal limits  TROPONIN I - Abnormal; Notable for the following:    Troponin I 0.03 (*)    All other components within normal limits  GLUCOSE, CAPILLARY - Abnormal; Notable for the following:    Glucose-Capillary 173 (*)    All other components within normal limits  CBG MONITORING, ED - Abnormal; Notable for the following:    Glucose-Capillary 120 (*)    All other components within normal limits  I-STAT VENOUS BLOOD GAS, ED - Abnormal; Notable for the following:    pH, Ven 7.433 (*)    pCO2, Ven 42.3 (*)    pO2, Ven 29.0 (*)    Bicarbonate 28.3 (*)    Acid-Base Excess 4.0 (*)    All other components within normal limits  MAGNESIUM  VALPROIC ACID LEVEL  ETHANOL  CK  CORTISOL-AM, BLOOD  GLUCOSE, CAPILLARY  RAPID URINE DRUG SCREEN, HOSP PERFORMED  MAGNESIUM  I-STAT CG4 LACTIC ACID, ED  I-STAT TROPOININ, ED    EKG  EKG Interpretation  Date/Time:  Thursday Jan 18 2017 16:25:03 EDT Ventricular Rate:  63 PR Interval:    QRS Duration: 111 QT Interval:  428 QTC Calculation: 439 R Axis:   61 Text Interpretation:  Sinus rhythm Ventricular premature complex Probable left atrial enlargement Borderline repolarization abnormality Similar nonspecific TW changes Confirmed by [facility] MD, Albaro Deviney ([id number]) on 01/18/2017  6:24:08 PM       Radiology Dg Chest 2 View  Result Date: 01/18/2017 CLINICAL DATA:  Syncope EXAM: CHEST  2 VIEW COMPARISON:  09/10/2016 FINDINGS: Mild cardiomegaly without overt failure. No focal consolidation or pleural effusion. No pneumothorax. Electronic device over the left chest. IMPRESSION: No active cardiopulmonary disease. Electronically Signed   By: Donavan Foil M.D.   On: 01/18/2017 18:24    Procedures Procedures (including critical care time)  Medications Ordered in ED Medications  enoxaparin (LOVENOX) injection 40 mg (40 mg Subcutaneous Given 01/19/17 0057)  acetaminophen (TYLENOL) tablet 500 mg (not administered)  albuterol (PROVENTIL) (2.5 MG/3ML) 0.083% nebulizer solution 2.5 mg (not administered)  aspirin EC tablet 81 mg (81 mg Oral Given 01/19/17 1005)  carvedilol (COREG) tablet 25 mg (25 mg Oral Not Given 01/19/17 1006)  chlorthalidone (HYGROTON) tablet 25 mg (25 mg Oral Given 01/19/17 1005)  cloNIDine (CATAPRES - Dosed in mg/24 hr) patch 0.2 mg (not administered)  clopidogrel (PLAVIX) tablet 75 mg (not administered)  divalproex (DEPAKOTE) DR tablet 500 mg (500 mg Oral Given 01/19/17 1005)  insulin glargine (LANTUS) injection 10 Units (10 Units Subcutaneous Given 01/19/17 0059)  losartan (COZAAR) tablet 100 mg (100 mg Oral Given 01/19/17 1005)  insulin aspart (novoLOG) injection 0-9 Units (0 Units Subcutaneous Not Given 01/19/17 0703)  0.9 %  sodium chloride infusion ( Intravenous New Bag/Given 01/19/17 1006)  latanoprost (XALATAN) 0.005 % ophthalmic solution 1 drop (1 drop Both Eyes Given 01/19/17 0059)  tamsulosin (FLOMAX) capsule 0.8 mg (0.8 mg Oral Given 01/19/17 0058)  potassium chloride SA (K-DUR,KLOR-CON) CR tablet 20 mEq (20 mEq Oral Given 01/18/17 2250)  potassium chloride SA (K-DUR,KLOR-CON) CR tablet 40 mEq (40 mEq Oral Given 01/19/17 0702)     Initial Impression / Assessment and Plan / ED Course  I have reviewed the triage vital signs and the nursing  notes.  Pertinent labs & imaging results that were available during my care of the patient were reviewed by me and considered in my medical decision making (see chart for details).     71yo male with history of hypertension, hyperlipidemia, episodes of facial droop and syncope since November for which he has had work up, possible TIA or seizures, who presents with episode of facial droop, slurred speech, syncope, dyspnea and report of episode of apnea/loss of radial pulses.  Loop recorder interrogated--no sign of arrhythmia.  Neuro exam now normal. Troponin negative. DDimer with mild elevation, pt with renal disease unable to perform CTA, age adjusted ddimer WNL and will allow inpt team to decide whether or not to pursue VQ scan. [facility] again for evaluation of ?TIA vs seizure.  DDx includes tia, seizure, other syncope.  Will admit for continued observation and evaluation.  Final Clinical Impressions(s) / ED Diagnoses   Final diagnoses:  Syncope    New Prescriptions Current Discharge Medication List       Gareth Morgan, MD 01/19/17 1008

## 2017-01-18 NOTE — H&P (Signed)
Date: 01/18/2017               Patient Name:  Cody Silva MRN: [id number]  DOB: [birth year] Age / Sex: 71 y.o., male   PCP: [facility] Service: Internal Medicine Teaching Service         Attending Physician: Dr. Aldine Contes    First Contact: Dr. Einar Gip Pager: [phone]  Second Contact: Dr. Ignacia Marvel Pager: [phone]       After Hours (After 5p/  First Contact Pager: [phone]  weekends / holidays): Second Contact Pager: [phone]   Chief Complaint: Syncope  History of Present Illness: Cody Silva is a 71 y.o. 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 He report feeling very tired with poor appetite and new difficulty swallowing since leaving the hospital last week. He has been in bed the entire time except to travel to two doctor appointments, but required a wheelchair for this. Today was his first physical therapy session and he got up and walked up and down the hall for the first time in "a while." He noticed lightheadedness on standing and felt increasingly tired while walking. He stated that he was feeling badly and sat down on a chair. The PT and his daughter noticed this change in him and notice that he was diaphoretic and had slurred speech. They called EMS and checked a BP and found it to be systolic 98, and it had been 150 a minutes before. He lost consciousness and slumped forward, drooling. EMS/fire arrived and noticed he was unresponsive and had stopped breathing, and they had difficulty palpating pulses. They placed him on the floor and found pulses, BP had improved to 120-140s/80s. He had labored breathing but no twitching or bowel incontinence, and gradually regained consciousness while supine. The episode lasted about 10 minutes. He denied pain, dyspnea, palpitations, confusion, or focal  weakness. He reports new difficulty swallowing solid foods in the last week, and sensation that food gets stuck in his mouth/throat. He denies odynophagia or coughing/choking with solids or liquids. His daughter reports that he has continued to lose weight (~10 lbs since last week).   He has a history of intermittent syncope with transient neuro deficits with extensive workup and three hospitalizations for this within the last year. Loop recorder has revealed no arrhythmias, neuroimaging show chronic microvascular changes, and EEGs have not captured any seizure activity. He was discharged from the hospital last week and was started on 3 months of DAPT and Depakote TID. He was also recently noted to have thenar atrophy and tongue fasciculations and workup for possible ALS is underway.   In the ED he was afebrile, HR 63, RR 14, hypertensive to 170/87, SpO2 98% on room air and alert with no neuro deficits. Labs were unremarkable (CBG 120, CBC wnl, CMP with Cr 2.2 at baseline, BNP 145, ddimer 0.7, trop 0.02, VBG pH 7.43, lactic 1.8, depakote level 71 nml). CXR and EKG with no significant findings. IMTS contacted for admission.   Meds:  Current Meds  Medication Sig  . acetaminophen (TYLENOL) 500 MG tablet Take 1 tablet (500 mg total) by mouth every 6 (six) hours as needed for mild pain, moderate pain, fever or headache.  . albuterol (PROVENTIL HFA;VENTOLIN HFA) 108 (90 BASE) MCG/ACT inhaler Inhale 2 puffs into the lungs  every 6 (six) hours as needed for wheezing.  [name] aspirin EC 81 MG EC tablet Take 1 tablet (81 mg total) by mouth daily.  [name] atorvastatin (LIPITOR) 20 MG tablet Take 1 tablet (20 mg total) by mouth daily at 6 PM.  . carvedilol (COREG) 25 MG tablet Take 25 mg by mouth 2 (two) times daily. Reported on 09/14/2015  . cetirizine (ZYRTEC) 10 MG tablet Take 10 mg by mouth at bedtime as needed for allergies.  . chlorthalidone (HYGROTON) 25 MG tablet Take 25 mg by mouth daily.  . cloNIDine (CATAPRES -  DOSED IN MG/24 HR) 0.2 mg/24hr patch Place 0.2 mg onto the skin once a week.  . clopidogrel (PLAVIX) 75 MG tablet Take 1 tablet (75 mg total) by mouth at bedtime.  . divalproex (DEPAKOTE) 500 MG DR tablet Take 1 tablet (500 mg total) by mouth 3 (three) times daily.  . feeding supplement, ENSURE ENLIVE, (ENSURE ENLIVE) LIQD Take 237 mLs by mouth 2 (two) times daily after a meal.  . fluticasone (FLONASE) 50 MCG/ACT nasal spray Place 2 sprays into both nostrils daily as needed for allergies.   [name] gabapentin (NEURONTIN) 300 MG capsule Take 300 mg by mouth at bedtime.   . insulin aspart (NOVOLOG) 100 UNIT/ML injection Inject 5-15 Units into the skin 3 (three) times daily with meals. Per sliding scale  . insulin glargine (LANTUS) 100 UNIT/ML injection Inject 20 Units into the skin at bedtime.   [name] latanoprost (XALATAN) 0.005 % ophthalmic solution Place 1 drop into both eyes at bedtime.   [name] losartan (COZAAR) 100 MG tablet Take 100 mg by mouth daily.  . polyvinyl alcohol (LIQUIFILM TEARS) 1.4 % ophthalmic solution Place 1 drop into both eyes 5 (five) times daily as needed (for dry eyes).  . potassium chloride (K-DUR,KLOR-CON) 10 MEQ tablet Take 10 mEq by mouth 2 (two) times daily.  . sertraline (ZOLOFT) 100 MG tablet Take 100 mg by mouth daily.  . Skin Protectants, Misc. (EUCERIN) cream Apply 1 application topically daily. FEET AND LEGS  . tamsulosin (FLOMAX) 0.4 MG CAPS capsule Take 0.8 mg by mouth at bedtime.    Allergies: Allergies as of 01/18/2017 - Review Complete 01/18/2017  Allergen Reaction Noted  . Metformin and related Diarrhea 06/12/2013  . Tape Rash 01/02/2017   Past Medical History:  Diagnosis Date  . Anemia   . Arthritis    "right arm, right ankle, right side" (10/30/2014)  . Colon polyps   . Diabetic neuropathy ([place])   . Diabetic retinopathy ([place])   . H/O agent Orange exposure   . Headache    "@ least 3 times/wk" (10/30/2014)  . History of blood transfusion 10/30/2014    hematochezia  . History of gout   . Hypertension   . OSA on CPAP    "suppose to wear mask; I've got a call in for an equipment change" (10/30/2014)  . PTSD (post-traumatic stress disorder)    "service related"  . Seizures ([place])   . Stroke [facility]) 2014   left extremity deficits; facial left  . Type II diabetes mellitus (HCC)     Family History:  Family History  Problem Relation Age of Onset  . Diabetes Mother   . Breast cancer Mother   . Heart attack Mother        CABG - Age 44  . Kidney disease Mother   . Stroke Brother   . Lung disease Father   . Neurofibromatosis Maternal Uncle   . Throat cancer Brother   .  Hypertension Brother     Social History:  Social History   Social History  . Marital status: Married    Spouse name: sheila  . Number of children: 4  . Years of education: 19   Occupational History  . Not on file.   Social History Main Topics  . Smoking status: Never Smoker  . Smokeless tobacco: Never Used  . Alcohol use 0.6 oz/week    1 Cans of beer per week     Comment: 10/30/2014 "might have a beer a couple times/yr"  . Drug use: No  . Sexual activity: Yes   Other Topics Concern  . Not on file   Social History Narrative   Patient is married with 4 children   Patient is right handed   Patient has a Water quality scientist degree    Patient 4 cups daily   Retired Engineer, structural. Lives with wife, daughter, cousin in [place].   Independent in ADLs and partially dependent for IADLs.   Ambulates with cane sometimes due to intermittent right leg weakness.     Review of Systems: A complete ROS was negative except as per HPI.   Physical Exam: Blood pressure (!) 176/88, pulse 60, temperature 97.6 F (36.4 C), temperature source Oral, resp. rate 14, height 6\' 4"  (1.93 m), weight 189 lb (85.7 kg), SpO2 99 %.  General appearance: Frail elderly gentleman resting comfortably in bed, in no distress, conversational and polite HENT: Normocephalic, atraumatic, moist  mucous membranes, poor dentition, tongue fasciculations present Eyes: PERRL, EOM inact, non-icteric Cardiovascular: Regular rate and rhythm, no murmurs, rubs, gallops Respiratory: Clear to auscultation bilaterally, normal work of breathing Abdomen: BS+, soft, non-tender, non-distended Extremities: Thenar muscle and shoulder girdle wasting, no edema, 2+ peripheral pulses Skin: Warm, dry, decreased skin turgor Neuro: Alert and oriented, cranial nerves grossly intact, 4/5 right grip strength, unable to shrug shoulders, otherwise strength intact, sensation grossly intact, FNF intact Psych: Blunted affect, clear speech, thoughts linear and goal-directed  Assessment & Plan by Problem: Principal Problem:   Syncope Active Problems:   Diabetes mellitus ([place])   Hypertension   History of CVA (cerebrovascular accident)   CKD stage 3 due to type 2 diabetes mellitus (HCC)   Weight loss  Syncope, presenting following an episode of weakness and loss of consciousness at home shortly after starting his first physical therapy session. He has been bedbound with decreased energy and appetite and poor by mouth intake for several weeks to months, and is very deconditioned. His blood pressure was reported to have dropped during this episode to systolic 93X, and normalized when he was laid supine. This episode is consistent with previously described syncopal episodes that he has been experiencing within the past year. No witnessed seizure activity, or postictal symptoms. Likely due to profound fatigue, poor po intake, and deconditioning that resulted in overexertion with minimal activity. He reports that he has been more drowsy since taking Depakote, and this medication has not affected the frequency of these episodes, consider discontinuation. -- Check orthostatic vitals -- IVF 75 cc/hr overnight -- PT eval and treat -- Neurology following, appreciate recommendations  Dysphagia, reporting new oropharyngeal  dysphasia within the past week, denies odynophagia or coughing/choking, he is a nonsmoker nondrinker but has been experiencing anorexia and weight loss for months and may require evaluation for malignancy (EGD). Additionally with his muscle wasting and tongue fasciculations this dysphagia may be a manifestation of an underlying motor neuron disease -- Continue regular diet, consider SLP eval if concern for worsening  dysphagia  Muscle wasting and tongue fasciculations, concern for ALS as a possible cause of his progressive functional decline over the past year -- Outpatient workup with neurology, EMG/NCV has been ordered  Hypertension, hypertensive to 170/87 on admission -- contintue home Carvedilol 25 mg BID, Chlorthalidone 25 mg QD, Losartan 100 mg QD, weekly Clonidine 0.2 mg patch  Insulin dependent type 2 diabetes, on Lantus 20 units QHS and SSI 0-15 units TID with meals at home. Appropriately skipping mealtime insulin if he does not eat at home. Last A1c 6.7 on 01/10/2017.  -- Lantus 10 units QHS and SSI-S TID AC  OSA -- CPAP QHS  FEN/GI: Regular diet, replete electrolytes as needed  DVT ppx: Lovenox  Code status: Full code  Dispo: Admit patient to Observation with expected length of stay less than 2 midnights.  Signed: Asencion Partridge, MD 01/18/2017, 9:26 PM  Pager: [phone]

## 2017-01-18 NOTE — ED Notes (Signed)
Patient transported to X-ray 

## 2017-01-19 ENCOUNTER — Encounter (HOSPITAL_COMMUNITY): Payer: Self-pay | Admitting: *Deleted

## 2017-01-19 ENCOUNTER — Observation Stay (HOSPITAL_COMMUNITY): Payer: Medicare Other

## 2017-01-19 DIAGNOSIS — Z9119 Patient's noncompliance with other medical treatment and regimen: Secondary | ICD-10-CM

## 2017-01-19 DIAGNOSIS — E43 Unspecified severe protein-calorie malnutrition: Secondary | ICD-10-CM | POA: Insufficient documentation

## 2017-01-19 DIAGNOSIS — G4733 Obstructive sleep apnea (adult) (pediatric): Secondary | ICD-10-CM

## 2017-01-19 DIAGNOSIS — R253 Fasciculation: Secondary | ICD-10-CM | POA: Diagnosis not present

## 2017-01-19 DIAGNOSIS — Z8279 Family history of other congenital malformations, deformations and chromosomal abnormalities: Secondary | ICD-10-CM

## 2017-01-19 DIAGNOSIS — Z7902 Long term (current) use of antithrombotics/antiplatelets: Secondary | ICD-10-CM

## 2017-01-19 DIAGNOSIS — R1312 Dysphagia, oropharyngeal phase: Secondary | ICD-10-CM

## 2017-01-19 DIAGNOSIS — Z7982 Long term (current) use of aspirin: Secondary | ICD-10-CM | POA: Diagnosis not present

## 2017-01-19 DIAGNOSIS — K0889 Other specified disorders of teeth and supporting structures: Secondary | ICD-10-CM | POA: Diagnosis not present

## 2017-01-19 DIAGNOSIS — I69351 Hemiplegia and hemiparesis following cerebral infarction affecting right dominant side: Secondary | ICD-10-CM

## 2017-01-19 DIAGNOSIS — Z79899 Other long term (current) drug therapy: Secondary | ICD-10-CM

## 2017-01-19 DIAGNOSIS — Z91048 Other nonmedicinal substance allergy status: Secondary | ICD-10-CM

## 2017-01-19 DIAGNOSIS — E119 Type 2 diabetes mellitus without complications: Secondary | ICD-10-CM

## 2017-01-19 DIAGNOSIS — Z794 Long term (current) use of insulin: Secondary | ICD-10-CM

## 2017-01-19 DIAGNOSIS — Z833 Family history of diabetes mellitus: Secondary | ICD-10-CM

## 2017-01-19 DIAGNOSIS — Z841 Family history of disorders of kidney and ureter: Secondary | ICD-10-CM

## 2017-01-19 DIAGNOSIS — Z8249 Family history of ischemic heart disease and other diseases of the circulatory system: Secondary | ICD-10-CM

## 2017-01-19 DIAGNOSIS — Z836 Family history of other diseases of the respiratory system: Secondary | ICD-10-CM

## 2017-01-19 DIAGNOSIS — Z823 Family history of stroke: Secondary | ICD-10-CM

## 2017-01-19 DIAGNOSIS — I10 Essential (primary) hypertension: Secondary | ICD-10-CM | POA: Diagnosis not present

## 2017-01-19 DIAGNOSIS — Z888 Allergy status to other drugs, medicaments and biological substances status: Secondary | ICD-10-CM

## 2017-01-19 DIAGNOSIS — Z800 Family history of malignant neoplasm of digestive organs: Secondary | ICD-10-CM

## 2017-01-19 DIAGNOSIS — M6250 Muscle wasting and atrophy, not elsewhere classified, unspecified site: Secondary | ICD-10-CM

## 2017-01-19 DIAGNOSIS — R55 Syncope and collapse: Secondary | ICD-10-CM | POA: Diagnosis not present

## 2017-01-19 DIAGNOSIS — Z803 Family history of malignant neoplasm of breast: Secondary | ICD-10-CM

## 2017-01-19 LAB — GLUCOSE, CAPILLARY
Glucose-Capillary: 81 mg/dL (ref 65–99)
Glucose-Capillary: 138 mg/dL — ABNORMAL HIGH (ref 65–99)
Glucose-Capillary: 92 mg/dL (ref 65–99)
Glucose-Capillary: 78 mg/dL (ref 65–99)

## 2017-01-19 LAB — BASIC METABOLIC PANEL
Anion gap: 8 (ref 5–15)
BUN: 24 mg/dL — AB (ref 6–20)
CHLORIDE: 104 mmol/L (ref 101–111)
CO2: 28 mmol/L (ref 22–32)
Calcium: 9.1 mg/dL (ref 8.9–10.3)
Creatinine, Ser: 2.14 mg/dL — ABNORMAL HIGH (ref 0.61–1.24)
GFR calc Af Amer: 35 mL/min — ABNORMAL LOW (ref >60)
GFR calc non Af Amer: 30 mL/min — ABNORMAL LOW (ref >60)
GLUCOSE: 110 mg/dL — AB (ref 65–99)
POTASSIUM: 3.3 mmol/L — AB (ref 3.5–5.1)
Sodium: 140 mmol/L (ref 135–145)

## 2017-01-19 LAB — MAGNESIUM: MAGNESIUM: 1.5 mg/dL — AB (ref 1.7–2.4)

## 2017-01-19 LAB — RAPID URINE DRUG SCREEN, HOSP PERFORMED
AMPHETAMINES: NOT DETECTED
BARBITURATES: NOT DETECTED
Benzodiazepines: NOT DETECTED
Cocaine: NOT DETECTED
Opiates: NOT DETECTED
Tetrahydrocannabinol: NOT DETECTED

## 2017-01-19 LAB — CORTISOL-AM, BLOOD: CORTISOL - AM: 10.4 ug/dL (ref 6.7–22.6)

## 2017-01-19 MED ORDER — BOOST / RESOURCE BREEZE PO LIQD
1.0000 | Freq: Three times a day (TID) | ORAL | Status: DC
  Administered 2017-01-19 – 2017-01-20 (×3): 1 via ORAL
  Filled 2017-01-19 (×4): qty 1

## 2017-01-19 MED ORDER — ATORVASTATIN CALCIUM 10 MG PO TABS
20.0000 mg | ORAL_TABLET | Freq: Every day | ORAL | Status: DC
  Administered 2017-01-19: 20 mg via ORAL
  Filled 2017-01-19: qty 2

## 2017-01-19 MED ORDER — POTASSIUM CHLORIDE CRYS ER 20 MEQ PO TBCR
40.0000 meq | EXTENDED_RELEASE_TABLET | Freq: Once | ORAL | Status: AC
  Administered 2017-01-19: 40 meq via ORAL
  Filled 2017-01-19: qty 2

## 2017-01-19 MED ORDER — POLYVINYL ALCOHOL 1.4 % OP SOLN
1.0000 [drp] | OPHTHALMIC | Status: DC | PRN
  Filled 2017-01-19: qty 15

## 2017-01-19 MED ORDER — SODIUM CHLORIDE 0.9 % IV BOLUS (SEPSIS)
1000.0000 mL | Freq: Once | INTRAVENOUS | Status: AC
  Administered 2017-01-19: 1000 mL via INTRAVENOUS

## 2017-01-19 NOTE — Progress Notes (Signed)
Subjective: Cody Silva was sitting up in his bed this morning with uneaten breakfast in front of him. He was alert and able to answer questions appropriately, but did mention that he still felt tired, consistent with his lack of energy for the last several months. He was able to describe beginning to walk with PT at his home yesterday, and stated that after he finished walking he "passed out" and the next thing he remembered was EMS being present. He denied feeling dizzy when he stands up from sitting but did say that he has felt consistent weakness and lack of energy over the last several weeks.   Objective: Vital signs in last 24 hours: Vitals:   01/18/17 2215 01/18/17 2346 01/19/17 0543 01/19/17 1007  BP: (!) 184/90 (!) 184/79 (!) 178/87 (!) 153/72  Pulse: (!) 59 64 (!) 55 (!) 56  Resp:  18 18 14   Temp:  98.1 F (36.7 C) 98.7 F (37.1 C) 98.6 F (37 C)  TempSrc:  Oral Oral Oral  SpO2: 97% 100% 100% 100%  Weight:  109.8 kg (242 lb 1.6 oz)    Height:  6\' 1"  (1.854 m)      Physical Exam: General: alert and oriented but somewhat lethargic  Pulm: CAB, no increased work of breathing  CV: RRR, nMRG  Abd: BS+, NTND, soft Neuro: tongue fasciculations appreciated, CN grossly intact, normal rapid alternating hand movements, finger to nose delayed bilaterally, strength and sensation 5/5 bilaterally, thenar atrophy bilaterally   Assessment/Plan: Principal Problem:   Syncope Active Problems:   Diabetes mellitus ([place])   Hypertension   CKD stage 3 due to type 2 diabetes mellitus ([place])   Weight loss  Recurrent Syncopal Episodes with Loss of Consciousness: Cody Silva presented yesterday after a reported syncopal episode while working with PT at his home. The description of this event did not seem like a seizure, and his level of consciousness at presentation did not seem post-ictal. His current lack of focal neurological defects make a recurrent stroke seem unlikely. However, a MRI has been  ordered to assess for any new focal defects. In addition, the patient has recently had Loop monitor recording, which has not revealed cardiac dysrhythmia, in addition troponins were negative on admission, making cardiac etiologies an unlikely cause of the syncopal episodes. Finally, AM cortisol was normal this morning making adrenal insufficiency an unlikely cause of weakness and syncope. Cody Silva has had multiple admissions for similar episodes with negative workups for acute neurologic events, but still complains of weakness and fatigue that limit his functional capacity. Based on the presence of tongue fasciculations, thenar atrophy, new onset weakness with swallowing causing dysphagia, unexplained fatigue and possible autonomic dysfunction, ALS could be a reasonable explanation. In addition orthostatic hypotension due to poor PO intake, coupled with deconditioning due to repeated hospitalization and overall lack of activity could explain the recent syncopal episode.  --continue seizure ppx with Depakote 500mg  TID  --repeat orthostatic vitals after IVF replacement  --MRI head  --Consult Neurology- appreciate their recommendations  --PT evaluation for possible SNF placement  --SLP evaluation --EMG for evaluation of nerve conduction  HTN/ Hyperlipidemia:  --atorvastatin 20mg   --Carvedilol 25mg  BID, Chlorthalidone 25mg  daily, Losartan 100mg  daily, weekly clonidine 0.2mg  patch  OSA:  Patient does not wear a CPAP at baseline. We will try to counsel patient on the utility of a CPAP for symptoms of fatigue and begin a trial of CPAP as an inpatient.   Insulin Dependent Type 2 DM:  --sensitive sliding  scale insulin (insulin aspart) --Insulin Glargine dailey at bedtime   This is a Careers information officer Note.  The care of the patient was discussed with Dr. Danford Bad and the assessment and plan formulated with their assistance.  Please see their attached note for official documentation of the daily encounter.    LOS: 0 days   Cyndie Mull, Medical Student 01/19/2017, 10:26 AM

## 2017-01-19 NOTE — Evaluation (Signed)
Physical Therapy Evaluation Patient Details Name: Cody Silva MRN: [id number] DOB: [birth year] Today's Date: 01/19/2017   History of Present Illness  71 y.o. male history of stroke with residual right hemiparesis, diabetes mellitus, hypertension, PTSD, seizures as well as episodes of loss of consciousness of unclear etiology, brought to the ED for evaluation following an episode of loss of consciousness at home. Patient was undergoing home health physical therapy at the time. He was noted to become less responsive with eyes closed and slurred speech.   Clinical Impression  Pt admitted with above diagnosis. Pt currently with functional limitations due to the deficits listed below (see PT Problem List). On eval, pt required min guard assist transfers and ambulation 150 feet without AD. Pt will benefit from skilled PT to increase their independence and safety with mobility to allow discharge to the venue listed below.  He has rollator at home as well as scooter for use in the community. Encouraged pt to use rollator for increased stability with ambulation. He verbalizes understanding.      Follow Up Recommendations Home health PT;Supervision/Assistance - 24 hour    Equipment Recommendations  None recommended by PT    Recommendations for Other Services       Precautions / Restrictions Precautions Precautions: Fall      Mobility  Bed Mobility         Supine to sit: Supervision;HOB elevated Sit to supine: HOB elevated;Supervision   General bed mobility comments: +rail, increased time to complete  Transfers   Equipment used: None   Sit to Stand: Min guard         General transfer comment: min guard for safety, increased time  Ambulation/Gait Ambulation/Gait assistance: Min guard Ambulation Distance (Feet): 150 Feet Assistive device: None Gait Pattern/deviations: Step-through pattern;Decreased stride length;Shuffle Gait velocity: decreased Gait velocity  interpretation: Below normal speed for age/gender General Gait Details: very slow, guarded gait. Pt ambulated in nonskid socks. He reports always wearing shoes at home.  Stairs            Wheelchair Mobility    Modified Rankin (Stroke Patients Only)       Balance   Sitting-balance support: Feet supported;No upper extremity supported Sitting balance-Leahy Scale: Good     Standing balance support: No upper extremity supported;During functional activity Standing balance-Leahy Scale: Fair Standing balance comment: able to ambulate without AD min guard. Difficulty accepting dynamic challenges.                             Pertinent Vitals/Pain Pain Assessment: No/denies pain    Home Living Family/patient expects to be discharged to:: Private residence Living Arrangements: Spouse/significant other Available Help at Discharge: Family;Available 24 hours/day Type of Home: House Home Access: Stairs to enter Entrance Stairs-Rails: None Entrance Stairs-Number of Steps: 1 Home Layout: Two level;Bed/bath upstairs Home Equipment: Walker - 4 wheels;Cane - single point;Electric scooter      Prior Function Level of Independence: Independent with assistive device(s)         Comments: uses cane ocassionally     Hand Dominance   Dominant Hand: Right    Extremity/Trunk Assessment   Upper Extremity Assessment Upper Extremity Assessment: Defer to OT evaluation    Lower Extremity Assessment Lower Extremity Assessment: RLE deficits/detail RLE Deficits / Details: hx of R weakness at baseline    Cervical / Trunk Assessment Cervical / Trunk Assessment: Kyphotic  Communication   Communication: No difficulties  Cognition Arousal/Alertness:  Awake/alert Behavior During Therapy: Flat affect Overall Cognitive Status: Within Functional Limits for tasks assessed                                        General Comments      Exercises      Assessment/Plan    PT Assessment Patient needs continued PT services  PT Problem List Decreased strength;Decreased activity tolerance;Decreased balance;Decreased mobility;Decreased coordination;Decreased cognition       PT Treatment Interventions DME instruction;Gait training;Stair training;Functional mobility training;Therapeutic activities;Therapeutic exercise;Balance training;Neuromuscular re-education;Patient/family education    PT Goals (Current goals can be found in the Care Plan section)  Acute Rehab PT Goals Patient Stated Goal: home PT Goal Formulation: With patient Time For Goal Achievement: 02/02/17 Potential to Achieve Goals: Good    Frequency Min 3X/week   Barriers to discharge        Co-evaluation               AM-PAC PT "6 Clicks" Daily Activity  Outcome Measure Difficulty turning over in bed (including adjusting bedclothes, sheets and blankets)?: None Difficulty moving from lying on back to sitting on the side of the bed? : A Little Difficulty sitting down on and standing up from a chair with arms (e.g., wheelchair, bedside commode, etc,.)?: A Little Help needed moving to and from a bed to chair (including a wheelchair)?: A Little Help needed walking in hospital room?: A Little Help needed climbing 3-5 steps with a railing? : A Little 6 Click Score: 19    End of Session Equipment Utilized During Treatment: Gait belt Activity Tolerance: Patient tolerated treatment well Patient left: in bed;with call bell/phone within reach;with family/visitor present Nurse Communication: Mobility status PT Visit Diagnosis: Unsteadiness on feet (R26.81)    Time: 7169-6789 PT Time Calculation (min) (ACUTE ONLY): 25 min   Charges:   PT Evaluation $PT Eval Moderate Complexity: 1 Procedure PT Treatments $Gait Training: 8-22 mins   PT G Codes:   PT G-Codes **NOT FOR INPATIENT CLASS** Functional Assessment Tool Used: AM-PAC 6 Clicks Basic Mobility Functional  Limitation: Mobility: Walking and moving around Mobility: Walking and Moving Around Current Status (F8101): At least 20 percent but less than 40 percent impaired, limited or restricted Mobility: Walking and Moving Around Goal Status [id number]): At least 1 percent but less than 20 percent impaired, limited or restricted    Lorrin Goodell, PT  Office # [phone] Pager [phone]   Lorriane Shire 01/19/2017, 12:06 PM

## 2017-01-19 NOTE — Progress Notes (Signed)
Initial Nutrition Assessment  DOCUMENTATION CODES:   Severe malnutrition in context of acute illness/injury  INTERVENTION:    Boost Breeze po TID, each supplement provides 250 kcal and 9 grams of protein  Feeding assistance as needed if too weak to feed self   NUTRITION DIAGNOSIS:   Malnutrition (Moderate) related to acute illness as evidenced by moderate depletions of muscle mass, mild depletion of body fat.  GOAL:   Patient will meet greater than or equal to 90% of their needs  MONITOR:   PO intake, Supplement acceptance, Labs, Weight trends  REASON FOR ASSESSMENT:   Malnutrition Screening Tool    ASSESSMENT:   71 yo male admitted with syncope; pt has been bed bound with decreased energy, decreased appetite and poor by mouth intake for several weeks to months. Pt also reporting new oropharyngeal dysphagia with hx of DM, HTN, CVA with right sided hemiparesis, CKD III, wt loss   Pt alert on visit today, son Dellis Filbert at bedside. Pt appears very weak. Pt reports appetite has been down the last 1-2 weeks, not really eating well but then reports he has been eating 2 meals per day (son shaking head indicating "no" that pt has not been eating that well) and drinking Ensure Clear daily.   Pt evaluated by SLP this AM and diet downgraded to Dysphagia III. Pt reports he was doing well with breakfast this AM but did not eat much as he kept getting interrupted by MDs. Recorded po intake 100% of meals  Pt reports 10 pounds wt loss in 1-2 weeks; 4% wt loss. Recommend re-weighing as current recorded wt does not trend with recent weights  Nutrition-Focused physical exam completed. Findings are mild fat depletion, mild/moderate muscle depletion, and no edema.   Labs: CBGs 81-173, potassium 3.3, Creatinine 2.14 Meds: ss novolog, lantus, KCl  Diet Order:  DIET DYS 3 Room service appropriate? Yes; Fluid consistency: Thin  Skin:  Reviewed, no issues  Last BM:  no documented BM  Height:    Ht Readings from Last 1 Encounters:  01/18/17 6\' 1"  (1.854 m)    Weight:   Wt Readings from Last 1 Encounters:  01/18/17 242 lb 1.6 oz (109.8 kg)    Ideal Body Weight:     BMI:  Body mass index is 31.94 kg/m.  Estimated Nutritional Needs:   Kcal:  2200-2500 kcal  Protein:  110-125 g  Fluid:  >/= 2.2 L  EDUCATION NEEDS:   No education needs identified at this time  [place], Leonard, LDN [phone] Pager  [phone] Weekend/On-Call Pager

## 2017-01-19 NOTE — Progress Notes (Signed)
Subjective: Pt notes since discharge he has been lethargic with poor appetite. Has not left house except for to go to Drs appointments and has required a wheelchair due to weakness rendering him unable to ambulate.   Objective:  Vital signs in last 24 hours: Vitals:   01/18/17 2145 01/18/17 2215 01/18/17 2346 01/19/17 0543  BP: (!) 188/88 (!) 184/90 (!) 184/79 (!) 178/87  Pulse: (!) 55 (!) 59 64 (!) 55  Resp: _0 Temp:   98.1 F (36.7 C) 98.7 F (37.1 C)  TempSrc:   Oral Oral  SpO2: 99% 97% 100% 100%  Weight:   242 lb 1.6 oz (109.8 kg)   Height:   _1  (1.854 m)    General: In no acute distress. Resting comfortably in bed. Eating a little breakfast.   HENT: PERRL. EOMI. No conjunctival injection, icterus or ptosis. Retained food in oral cavity. Pronounced BL tongue fasciculations. No deviation.  Cardiovascular: Regular rate and rhythm. No murmur or rub appreciated. Pulmonary: CTA BL, no wheezing, crackles or rhonchi appreciated. Unlabored breathing.  Abdomen: Soft, non-tender and non-distended.. +bowel sounds.  Skin: Warm, dry. No cyanosis, suspicious rashes or lesions.  Neuro: Pronounced BL thenar muscle wasting and intrinsic hand weakness. Difficulty shrugging shoulders.  ?shoulder girdle wasting. Sensation intact throughout.  Psych: Affect a little flat. Responds to questions appropriately.   Assessment/Plan:  Principal Problem:   Syncope Active Problems:   Diabetes mellitus ([place])   Hypertension   CKD stage 3 due to type 2 diabetes mellitus (HCC)   Weight loss  Syncope, Profound Orthostatic Hypotension, Severe Deconditioning: Patient reports he has been sedentary, lethargic and weak since discharge and has only left bed a handful of times. During his first visit with [facility] PT he collapsed after walking a short distance. BP standing 184/97 --> sitting143/72 --> lying 124/68. Has been on maintenance IVF since admission without NS bolus. Will give 1L and increase NS  rate. Notes he's been eating less secondary to progressive dysphagia. Do not see any infectious etiology to explain symptoms and doubt new stroke however will obtain MRI. He is on ASA and Plavix daily for DAPT. Neurology is on board, and if MRI is without new stroke, do not recommend any further inpatient work-up however do recommend outpatient work-up issues described below.  -AM cortisol normal at 10.4 -?Autonomic dysfunction. Will repeat orthostatic vitals after IVF replacement. If still orthostatic, will give additional IVF + administer compression stockings and abdominal binder -FU MRI -FU Neuro recs  -Continue DAPT, statin -Have asked PT to see patient for evaluation/recomendations. Patient might benefit from SNF placement   UMN + LMN + Bulbar Symptoms: He has significant thenar wasting and weakness of intrinsic hand muscles. In addition he has tongue fasciculations in addition to worsening dysphagia. I worry about this combination of UMN/LMN/Bulbar and lack of sensory symptoms and their relation to motor neuron diseases, namely ALS. He was scheduled to have outpatient EMGs for further evaluation however presented before this could be followed-up in the outpatient setting.  -He will require additional evaluation including EMGs, perhaps muscle biopsy? -SLP evaluation - patient had retained food in mouth on examination and family notes worsening dysphagia as well since d/c -TSH 0.7 09/2016, CK 98, ESR 37 09/2016, B12 451 09/2016 -HIV non-reactive  ?Hx of Seizures, Hx of TIA's and Hx of Stroke: Was started on Depakote at last discharge. Could explain patients fatigue. Will continue this and gabapentin however could consider discontinuing.   HTN:  Continued home regimen of Carvedilol 25 mg BID, Chlorthalidone 25 mg daily, Losartan 167m daily, and weekly clonidine 0.2 mg patch. BP elevated here in 170's-180's.   IDDM: On lantus 20 units QHS + SSI TID at home. Recent A1c 6.7%. -Lantus 10 units QHS +  Sensitive sliding scale while here. Will need to adjust based on diet recommendations from SLP.   Dispo: Anticipated discharge pending neurology recommendations and PT evaluation.   Frederika Hukill, DO 01/19/2017, 10:07 AM Pager: [phone]

## 2017-01-19 NOTE — Evaluation (Signed)
Clinical/Bedside Swallow Evaluation Patient Details  Name: Cody Silva MRN: [id number] Date of Birth: [birth year]  Today's Date: 01/19/2017 Time: SLP Start Time (ACUTE ONLY): 1051 SLP Stop Time (ACUTE ONLY): 1109 SLP Time Calculation (min) (ACUTE ONLY): 18 min  Past Medical History:  Past Medical History:  Diagnosis Date  . Anemia   . Arthritis    "right arm, right ankle, right side" (10/30/2014)  . Colon polyps   . Diabetic neuropathy ([place])   . Diabetic retinopathy ([place])   . H/O agent Orange exposure   . Headache    "@ least 3 times/wk" (10/30/2014)  . History of blood transfusion 10/30/2014   hematochezia  . History of gout   . Hypertension   . OSA on CPAP    "suppose to wear mask; I've got a call in for an equipment change" (10/30/2014)  . PTSD (post-traumatic stress disorder)    "service related"  . Seizures ([place])   . Stroke [facility]) 2014   left extremity deficits; facial left  . Type II diabetes mellitus ([place])    Past Surgical History:  Past Surgical History:  Procedure Laterality Date  . BONE GRAFT HIP ILIAC CREST Left ~ 1976  . CATARACT EXTRACTION W/ INTRAOCULAR LENS  IMPLANT, BILATERAL Bilateral 2014-2015  . EP IMPLANTABLE DEVICE N/A 12/24/2015   Procedure: Loop Recorder Insertion;  Surgeon: Thompson Grayer, MD;  Location: [place] [facility];  Service: Cardiovascular;  Laterality: N/A;  . TUMOR REMOVAL Right ~ 1976   "arm; had to take bone left hip to add to the repair"   HPI:  71 y.o. male history of stroke with residual right hemiparesis, diabetes mellitus, hypertension, PTSD, seizures as well as episodes of loss of consciousness of unclear etiology, brought to the ED for evaluation following an episode of loss of consciousness at home. Patient was undergoing home health physical therapy at the time. He was noted to become less responsive with eyes closed and slurred speech. Pt passed the RN stroke swallow screen but SLP evaluation was ordered due to pt report of  worsening dysphagia with solids with resultant weight loss.   Assessment / Plan / Recommendation Clinical Impression  Pt has generalized oral weakness, mild dysarthric with low vocal intensity and imprecise articulation, and the appearance of fasciculations with tongue protrusion. He describes his swallowing difficulties as trouble with mastication, stating that he will sometimes chew harder textures like meats for prolonged periods of time and still have to spit them out because they are not chewed well enough. He blames this on his lack of dentition, and although he does have most of his teeth still. He masticated a piece of graham cracker well with only mildly prolonged preparation, but he says that he fatigues across a meal. Pt would benefit from Dys 3 diet with meats chopped and extra sauce/gravy to make them softer. He would also benefit from smaller, more frequeny meals to conserve energy and additional dietician f/u to maximize nutritional intake with softer textures. SLP will f/u for tolerance, implementation of strategies, and additional education.  SLP Visit Diagnosis: Dysphagia, oral phase (R13.11)    Aspiration Risk  Mild aspiration risk;Risk for inadequate nutrition/hydration    Diet Recommendation Dysphagia 3 (Mech soft);Thin liquid   Liquid Administration via: Cup;Straw Medication Administration: Whole meds with liquid Supervision: Patient able to self feed;Intermittent supervision to cue for compensatory strategies Compensations: Slow rate;Small sips/bites Postural Changes: Seated upright at 90 degrees    Other  Recommendations Recommended Consults: Other (Comment) (recommend additional  neuro w/u) Oral Care Recommendations: Oral care BID   Follow up Recommendations Home health SLP      Frequency and Duration min 2x/week  1 week       Prognosis Prognosis for Safe Diet Advancement: Fair Barriers to Reach Goals: Other (Comment) (question underlying etiology)       Swallow Study   General HPI: 71 y.o. male history of stroke with residual right hemiparesis, diabetes mellitus, hypertension, PTSD, seizures as well as episodes of loss of consciousness of unclear etiology, brought to the ED for evaluation following an episode of loss of consciousness at home. Patient was undergoing home health physical therapy at the time. He was noted to become less responsive with eyes closed and slurred speech. Pt passed the RN stroke swallow screen but SLP evaluation was ordered due to pt report of worsening dysphagia with solids with resultant weight loss. Type of Study: Bedside Swallow Evaluation Previous Swallow Assessment: none in chart Diet Prior to this Study: Regular;Thin liquids Temperature Spikes Noted: No Respiratory Status: Room air History of Recent Intubation: No Behavior/Cognition: Alert;Cooperative Oral Cavity Assessment: Other (comment) (appears to have fasciculations with tongue protrusion) Oral Care Completed by SLP: No Oral Cavity - Dentition: Missing dentition Vision: Functional for self-feeding Self-Feeding Abilities: Able to feed self Patient Positioning: Upright in bed Baseline Vocal Quality: Low vocal intensity (mildly soft and with mildly dysarthric speech) Volitional Cough: Weak Volitional Swallow: Able to elicit    Oral/Motor/Sensory Function Overall Oral Motor/Sensory Function: Generalized oral weakness (with ?fasciculations with tongue protrusion)   Ice Chips Ice chips: Not tested   Thin Liquid Thin Liquid: Impaired Presentation: Cup;Self Fed;Straw Oral Phase Functional Implications: Oral holding    Nectar Thick Nectar Thick Liquid: Not tested   Honey Thick Honey Thick Liquid: Not tested   Puree Puree: Within functional limits Presentation: Self Fed;Spoon   Solid   GO   Solid: Impaired Presentation: Self Fed Oral Phase Functional Implications: Impaired mastication        [name] Osgood 01/19/2017,1:55 PM  [name] Osgood, M.A. CCC-SLP [phone]

## 2017-01-19 NOTE — Progress Notes (Signed)
Neurology Note  Chart reviewed. Patient seen by Dr. Nicole Kindred late last night. MRI brain reviewed. This shows no acute ischemia. Old lacunar infarcts are noted in the internal capsules bilaterally. The recent R corona radiata infarct demonstrates the expected radiographic evolution. Moderate chronic small vessel disease is present.   At this time, continue evaluation for presumed syncope. Given concern for seizures, continue VPA and gabapentin at their current doses, no clear need for change at this time. Continue aspirin, Plavix and statin along with risk factor modification for secondary stroke prevention. With concern for possible motor neuron disease, he will need outpatient EMG/NCV.   No additional recommendations at this time. Will sign off. Call if any new issues should arise.

## 2017-01-20 DIAGNOSIS — R55 Syncope and collapse: Secondary | ICD-10-CM | POA: Diagnosis not present

## 2017-01-20 LAB — GLUCOSE, CAPILLARY
GLUCOSE-CAPILLARY: 172 mg/dL — AB (ref 65–99)
Glucose-Capillary: 77 mg/dL (ref 65–99)
Glucose-Capillary: 115 mg/dL — ABNORMAL HIGH (ref 65–99)

## 2017-01-20 MED ORDER — SODIUM CHLORIDE 0.9 % IV BOLUS (SEPSIS)
1000.0000 mL | Freq: Once | INTRAVENOUS | Status: AC
  Administered 2017-01-20: 1000 mL via INTRAVENOUS

## 2017-01-20 MED ORDER — MAGNESIUM SULFATE 2 GM/50ML IV SOLN
2.0000 g | Freq: Once | INTRAVENOUS | Status: AC
  Administered 2017-01-20: 2 g via INTRAVENOUS
  Filled 2017-01-20: qty 50

## 2017-01-20 MED ORDER — CARVEDILOL 12.5 MG PO TABS: 12.5000 mg | ORAL_TABLET | Freq: Two times a day (BID) | ORAL | 0 refills | Status: DC

## 2017-01-20 MED ORDER — INSULIN GLARGINE 100 UNIT/ML ~~LOC~~ SOLN: 5.0000 [IU] | Freq: Every day | SUBCUTANEOUS | 11 refills | Status: DC

## 2017-01-20 MED ORDER — INSULIN GLARGINE 100 UNIT/ML ~~LOC~~ SOLN
5.0000 [IU] | Freq: Every day | SUBCUTANEOUS | Status: DC
  Filled 2017-01-20: qty 0.05

## 2017-01-20 MED ORDER — CARVEDILOL 12.5 MG PO TABS
12.5000 mg | ORAL_TABLET | Freq: Two times a day (BID) | ORAL | Status: DC
  Administered 2017-01-20: 12.5 mg via ORAL
  Filled 2017-01-20: qty 1

## 2017-01-20 NOTE — Progress Notes (Signed)
   Subjective: Seen and evaluated today at bedside. No new complaints. Still pocketing food. Still fatigued.   Objective:  Vital signs in last 24 hours: Vitals:   01/19/17 1800 01/19/17 2103 01/20/17 0025 01/20/17 0459  BP: (!) 204/92 (!) 186/86 (!) 169/83 (!) 149/79  Pulse: 60 (!) 55 (!) 57 (!) 59  Resp: 18 18 18 18   Temp: 98.4 F (36.9 C) 98.4 F (36.9 C) 98.2 F (36.8 C) 98.5 F (36.9 C)  TempSrc: Oral Oral Oral Oral  SpO2: 100% 100% 100% 100%  Weight:      Height:       General: In no acute distress. Resting comfortably in bed. Eating a little breakfast.   HENT: PERRL. EOMI. No conjunctival injection, icterus or ptosis. Retained food in oral cavity. Pronounced BL tongue fasciculations. No deviation.  Cardiovascular: Regular rate and rhythm. No murmur or rub appreciated. Pulmonary: CTA BL, no wheezing, crackles or rhonchi appreciated. Unlabored breathing.  Abdomen: Soft, non-tender and non-distended. +bowel sounds.  Skin: Warm, dry. No cyanosis, suspicious rashes or lesions.  Neuro: Pronounced BL thenar muscle wasting  Sensation intact throughout.  Psych: Affect a little flat. Responds to questions appropriately.   Assessment/Plan:  Principal Problem:   Syncope Active Problems:   Diabetes mellitus ([place])   Hypertension   CKD stage 3 due to type 2 diabetes mellitus (HCC)   Weight loss   Protein-calorie malnutrition, severe  Syncope, Profound Orthostatic Hypotension, Severe Deconditioning: *Correction from yesterdays note, orthostats should read 180 lying --> 120 standing. Orthostats today again positive despite 2.5L IVF. Will give another L and order compression stockings and abdominal binder. Believe patient likely has some degree of autonomic dysfunction here. MRI negative for new infarction. -Decreased Carvedilol to 12.5 mg BID -Discuss with family -DC home with Cody Silva PT and close outpatient neurology follow-up  UMN + LMN + Bulbar Symptoms: Tongue fasciculations, thenar  atrophy and dysphagia still present. Worked with SLP who note generalized oral weakness and mild dysarthria. Patient notes difficulty with mastication, noting fatigue across a meal. Recs HH SLP + dysphagia 3 diet. He will require additional evaluation including EMGs/NCV.  -SLP recs dysphagia 3 diet and HH SLP -He would greatly benefit from outpatient EMG  ?Hx of Seizures, Hx of TIA's and Hx of Stroke: Neurology recs continuing depakote and gabapentin at current doses.   HTN: Decreased Carvedilol to 12.5 mg BID. Continued home regimen otherwise of Chlorthalidone 25 mg daily, Losartan 100mg  daily, and weekly clonidine 0.2 mg patch. BP remains elevated 150's-180's.   IDDM: On lantus 20 units QHS + SSI TID at home. Recent A1c 6.7%. Has not required hardly any sliding scale and I worry that patient will have hypoglycemia should this dose of lantus continue in light of diet recommendations.  -Decrease Lantus to 5 units QHS + sensitive sliding scale.   Dispo: Anticipated discharge today pending discussion with family.   Cody Strauch, DO 01/20/2017, 7:05 AM Pager: [phone]

## 2017-01-20 NOTE — Progress Notes (Signed)
Subjective: Cody Silva was sitting up in the chair this morning. Although he mentioned wanting to eat his breakfast, which had recently been delivered, he appeared lethargic when I entered the room. He responded to questions appropriately and was alert throughout the interview. He denied any further syncopal episodes and said he did not feel lightheaded while walking yesterday. He did endorse a feeling of fatigue, similar to the level that he has been feeling for the past several months.   Objective: Vital signs in last 24 hours: Vitals:   01/19/17 1800 01/19/17 2103 01/20/17 0025 01/20/17 0459  BP: (!) 204/92 (!) 186/86 (!) 169/83 (!) 149/79  Pulse: 60 (!) 55 (!) 57 (!) 59  Resp: 18 18 18 18   Temp: 98.4 F (36.9 C) 98.4 F (36.9 C) 98.2 F (36.8 C) 98.5 F (36.9 C)  TempSrc: Oral Oral Oral Oral  SpO2: 100% 100% 100% 100%  Weight:      Height:        Physical Exam:  General: NAD, sitting in chair, somewhat lethargic  Pulm: CAB, no increased work of breathing  CV: RRR, nMRG  Neuro: tongue fasciculations appreciated, CN grossly intact, normal rapid alternating hand movements, finger to nose delayed bilaterally, strength and sensation 5/5 bilaterally, thenar atrophy bilaterally   Assessment/Plan: Principal Problem:   Syncope Active Problems:   Diabetes mellitus (HCC)   Hypertension   CKD stage 3 due to type 2 diabetes mellitus (HCC)   Weight loss   Protein-calorie malnutrition, severe  Recurrent Syncopal Episodes with Loss of Consciousness: Cody Silva has had an MRI that did not show evidence of new infarcts, hemorrhage or other focal changes. Based on this imaging study, his presentation that did not seem post-ictal and his lack of focal neurologic changes, acute stroke and seizure are unlikely etiologies for his syncope and LOC. On possible etiology for the syncopal episode is orthostatic hypotension due to dehydration stemming from poor PO intake, combined with  deconditioning due to a prolonged history of lack of energy and extensive bed rest. However, based on Cody Silva several month long history of fatigue, and his history of similar neurological events with negative workups, it is likely that there is another factor underlying his condition. He is on track to follow up with neurology as an outpatient to evaluate for potential motor neuron disorders.  --continue seizure ppx with Depakote 500mg  TID  --repeat orthostatic vitals after IVF replacement  --Follow with Neurology as an outpatient for EMG nerve conduction study   HTN/ Hyperlipidemia:  On presentation, Cody Silva had significant orthostatic hypotension and the decision was made to half his home dose of Carvedilol. Yesterday, he was significantly hypertensive with Bps around and above 200. However, based on his recent syncopal episode and orthostatic hypotension on presentation, we will maintain this decrease in carvedilol. Cody Silva will need close outpatient follow up to monitor his blood pressure and optimize prevention of recurrent lacunar infarcts while also considering drops in blood pressure on standing as a potential etiology for his syncope.  --atorvastatin 20mg   --Carvedilol 12.5mg  BID, Chlorthalidone 25mg  daily, Losartan 100mg  daily, weekly clonidine 0.2mg  patch  OSA:  Patient does not wear a CPAP at baseline. We will try to counsel patient on the utility of a CPAP for symptoms of fatigue and begin a trial of CPAP as an inpatient.   Insulin Dependent Type 2 DM:  --sensitive sliding scale insulin (insulin aspart) --Insulin Glargine dailey at bedtime   Diet: FULL  Dispo: Anticipated discharge today  This is a Careers information officer Note.  The care of the patient was discussed with Dr. Danford Bad and the assessment and plan formulated with their assistance.  Please see their attached note for official documentation of the daily encounter.  Cyndie Mull, Medical Student 01/20/2017,  8:28 AM

## 2017-01-20 NOTE — Progress Notes (Signed)
Discharge instructions given. Pt verbalized understanding and all questions were answered.  

## 2017-01-20 NOTE — Progress Notes (Signed)
Medicine attending: Clinical status discussed at length with resident physician Dr. Einar Gip and third-year medical student Mr. Cyndie Mull and I concur with their evaluation and management plan which we discussed together.   71 year old man admitted on May 17 with recurrent idiopathic syncope.  Definite component of orthostatic hypotension.  No other gross signs of autonomic insufficiency.  Random 8 AM cortisol low normal at 10.4 units.  Could consider a ACTH stimulation test. Neurologic workup in progress in view of some neurologic signs including fasciculations of his tongue, wasting of the muscles in the thenar eminence, and progressive weakness and fatigue.  Some concern for underlying seizure disorder currently covered with Depakote.

## 2017-01-20 NOTE — Care Management Note (Signed)
Case Management Note  Patient Details  Name: Cody Silva MRN: [id number] Date of Birth: 09/06/1945  Subjective/Objective:                 Dc to home. Will resume HH with [facility]. Referral placed to [facility] clinical liaison [facility]. No other needs identified at this time.    Action/Plan:  DC to home. Expected Discharge Date:  01/20/17               Expected Discharge Plan:  [name]  In-House Referral:     Discharge planning Services  CM Consult  Post Acute Care Choice:  Home Health Choice offered to:  Patient  DME Arranged:    DME Agency:     HH Arranged:  PT, Speech Therapy [place] Agency:  [place]  Status of Service:  Completed, signed off  If discussed at [place] of Stay Meetings, dates discussed:    Additional Comments:  Carles Collet, RN 01/20/2017, 2:47 PM

## 2017-01-22 ENCOUNTER — Telehealth: Payer: Self-pay | Admitting: Neurology

## 2017-01-22 NOTE — Progress Notes (Signed)
SLP Note (late entry):   01/19/17 1300  SLP G-Codes **NOT FOR INPATIENT CLASS**  Functional Assessment Tool Used skilled clinical judgment  Functional Limitations Swallowing  Swallow Current Status (V6153) CJ  Swallow Goal Status (P9432) CI  SLP Evaluations  $ SLP Speech Visit 1 Procedure  SLP Evaluations  $BSS Swallow 1 Procedure   [name] Osgood, M.A. CCC-SLP [phone]

## 2017-01-22 NOTE — Discharge Summary (Signed)
Name: Cody Silva MRN: [id number] DOB: 1946-03-22 71 y.o. PCP: [facility]  Date of Admission: 01/18/2017  4:16 PM Date of Discharge: 01/21/2017 Attending Physician: Dr. Annye Rusk, MD  Discharge Diagnosis: 1. Syncope 2. Orthostatic Hypotension 3. Combined upper, lower and bulbar symptoms  Principal Problem:   Syncope Active Problems:   Diabetes mellitus ([place])   Hypertension   CKD stage 3 due to type 2 diabetes mellitus (HCC)   Weight loss   Protein-calorie malnutrition, severe   Discharge Medications: Allergies as of 01/20/2017      Reactions   Metformin And Related Diarrhea   Tape Rash      Medication List    TAKE these medications   acetaminophen 500 MG tablet Commonly known as:  TYLENOL Take 1 tablet (500 mg total) by mouth every 6 (six) hours as needed for mild pain, moderate pain, fever or headache.   albuterol 108 (90 Base) MCG/ACT inhaler Commonly known as:  PROVENTIL HFA;VENTOLIN HFA Inhale 2 puffs into the lungs every 6 (six) hours as needed for wheezing.   aspirin 81 MG EC tablet Take 1 tablet (81 mg total) by mouth daily.   atorvastatin 20 MG tablet Commonly known as:  LIPITOR Take 1 tablet (20 mg total) by mouth daily at 6 PM.   carvedilol 12.5 MG tablet Commonly known as:  COREG Take 1 tablet (12.5 mg total) by mouth 2 (two) times daily with a meal. What changed:  medication strength  how much to take  when to take this  additional instructions   cetirizine 10 MG tablet Commonly known as:  ZYRTEC Take 10 mg by mouth at bedtime as needed for allergies.   chlorthalidone 25 MG tablet Commonly known as:  HYGROTON Take 25 mg by mouth daily.   cloNIDine 0.2 mg/24hr patch Commonly known as:  CATAPRES - Dosed in mg/24 hr Place 0.2 mg onto the skin once a week.   clopidogrel 75 MG tablet Commonly known as:  PLAVIX Take 1 tablet (75 mg total) by mouth at bedtime.   divalproex 500 MG DR tablet Commonly known as:   DEPAKOTE Take 1 tablet (500 mg total) by mouth 3 (three) times daily.   eucerin cream Apply 1 application topically daily. FEET AND LEGS   feeding supplement (ENSURE ENLIVE) Liqd Take 237 mLs by mouth 2 (two) times daily after a meal.   fluocinonide cream 0.05 % Commonly known as:  LIDEX Apply 1 application topically daily.   fluticasone 50 MCG/ACT nasal spray Commonly known as:  FLONASE Place 2 sprays into both nostrils daily as needed for allergies.   gabapentin 300 MG capsule Commonly known as:  NEURONTIN Take 300 mg by mouth at bedtime.   GNP GINGKO BILOBA EXTRACT PO Take 1 capsule by mouth 2 (two) times a week.   insulin aspart 100 UNIT/ML injection Commonly known as:  novoLOG Inject 5-15 Units into the skin 3 (three) times daily with meals. Per sliding scale   insulin glargine 100 UNIT/ML injection Commonly known as:  LANTUS Inject 0.05 mLs (5 Units total) into the skin at bedtime. What changed:  how much to take   latanoprost 0.005 % ophthalmic solution Commonly known as:  XALATAN Place 1 drop into both eyes at bedtime.   losartan 100 MG tablet Commonly known as:  COZAAR Take 100 mg by mouth daily.   MULTIVITAMIN PO Take 1 tablet by mouth daily.   polyvinyl alcohol 1.4 % ophthalmic solution Commonly known as:  LIQUIFILM TEARS  Place 1 drop into both eyes 5 (five) times daily as needed (for dry eyes).   potassium chloride 10 MEQ tablet Commonly known as:  K-DUR,KLOR-CON Take 10 mEq by mouth 2 (two) times daily.   sertraline 100 MG tablet Commonly known as:  ZOLOFT Take 100 mg by mouth daily.   tamsulosin 0.4 MG Caps capsule Commonly known as:  FLOMAX Take 0.8 mg by mouth at bedtime.       Disposition and follow-up:   Cody Silva was discharged from [facility] in stable condition.  At the hospital follow up visit please address:  1.  Syncopal episode/orthostatic hypotension: Ensure compliance with compression stockings,  abdominal binder and also adequate PO intake. Please counsel patient on life-style modifications. **AM Cortisol was low-normal at 10.4, could consider ACTH stimulation test as an outpatient  UMN + LMN + Bulbar symptoms: Concerning for ALS. He will require close outpatient neurology follow up and an EMG.   2.  Labs / imaging needed at time of follow-up: EMG  3.  Pending labs/ test needing follow-up: None  Follow-up Appointments: Follow-up Information    [facility] Follow up.   Why:  for home health physical therapy and speech therapy. Contact information: [address] [place] [address] [place] [facility] Course by problem list: Principal Problem:   Syncope Active Problems:   Diabetes mellitus ([place])   Hypertension   CKD stage 3 due to type 2 diabetes mellitus (HCC)   Weight loss   Protein-calorie malnutrition, severe   1. Syncope, Orthostatic Hypotension Cody Silva presented 01/18/17 following a syncopal episode while working with PT on his first [facility] PT visit. On presentation he was profoundly orthostatic with a 60 pt drop in BP from laying --> standing. He received IVF with improvement of his orthostats however did not resolve. He did not appear dry on examination. Given patients below neurologic deficits, believe there is some component of autonomic dysfunction as well. PT and OT recommend [facility] PT which was arranged on discharge. Neurology was on board who agreed with limited work-up this admission given his extensive work-up recently. His Coreg was decreased to 12.5 mg BID. Of note, this was the patients 4th admission in the past year for similar events during which thorough neurological work-up was unrevealing.   2. Combined UMN, LMN and Bulbar symptoms Concerning for ALS or other similar disease processes. He was referred to neurology for EMG. He was seen by SLP who recommended soft diet and HH SLP which was ordered for the patient on  discharge.   Discharge Vitals:   BP (!) 174/79 (BP Location: Left Arm)   Pulse (!) 59   Temp 98.2 F (36.8 C) (Oral)   Resp 18   Ht 6\' 1"  (1.854 m)   Wt 242 lb 1.6 oz (109.8 kg)   SpO2 99%   BMI 31.94 kg/m   Pertinent Labs, Studies, and Procedures:  MRI: No acute infarction. Expected evolution of subacute infarction previously detected PT recs HH PT AM Cortisol 10.4  Discharge Instructions: Cody Silva, you were admitted following a syncopal episode while you were working with physical therapy. This was most likely due to low blood pressure when you stand due to a neurological problem. You received IV fluids and supportive therapy including compression stockings and an abdominal binder which should help reduce the severity.  You will continue to need close outpatient follow-up with neurology and  would benefit from EMG.   SignedEinar Gip, DO 01/22/2017, 2:03 PM   Pager: [phone]

## 2017-01-22 NOTE — Telephone Encounter (Signed)
Patients wife called and requested to speak with the nurse regarding an EMG that was ordered by Dr. Leonie Man and needs to be scheduled. She states that the hospital requested she call us on Monday and follow up to make sure this test was scheduled. Please call and advise.

## 2017-01-22 NOTE — Telephone Encounter (Signed)
This was given to [facility] in new pt referrals, which will be reviewed by  Dr. Jaynee Eagles and/or Dr. Krista Blue and then called to be scheduled.  Spoke to pts wife.  I do see note relating to 01-16-17.  Dr. Leonie Man spoke to ordering MD.  Once hear back will call wife on mobile.

## 2017-01-28 LAB — CUP PACEART REMOTE DEVICE CHECK
Date Time Interrogation Session: 20180516163631
Implantable Pulse Generator Implant Date: 20170421

## 2017-01-28 NOTE — Progress Notes (Signed)
Carelink summary report received. Battery status OK. Normal device function. No new symptom episodes, tachy episodes, brady, or pause episodes. No new AF episodes. Monthly summary reports and ROV/PRN 

## 2017-02-15 ENCOUNTER — Ambulatory Visit (INDEPENDENT_AMBULATORY_CARE_PROVIDER_SITE_OTHER): Payer: Medicare Other | Admitting: Neurology

## 2017-02-15 ENCOUNTER — Encounter: Payer: Self-pay | Admitting: Neurology

## 2017-02-15 VITALS — BP 157/89 | HR 65 | Ht 76.5 in | Wt 194.5 lb

## 2017-02-15 DIAGNOSIS — I6330 Cerebral infarction due to thrombosis of unspecified cerebral artery: Secondary | ICD-10-CM

## 2017-02-15 DIAGNOSIS — E1122 Type 2 diabetes mellitus with diabetic chronic kidney disease: Secondary | ICD-10-CM

## 2017-02-15 DIAGNOSIS — R55 Syncope and collapse: Secondary | ICD-10-CM

## 2017-02-15 DIAGNOSIS — N183 Chronic kidney disease, stage 3 (moderate): Secondary | ICD-10-CM

## 2017-02-15 DIAGNOSIS — G4733 Obstructive sleep apnea (adult) (pediatric): Secondary | ICD-10-CM | POA: Diagnosis not present

## 2017-02-15 DIAGNOSIS — E1121 Type 2 diabetes mellitus with diabetic nephropathy: Secondary | ICD-10-CM | POA: Diagnosis not present

## 2017-02-15 NOTE — Progress Notes (Signed)
PATIENT: Cody Silva DOB: [birth year]  Chief Complaint  Patient presents with  . Rm New    Wife, Freda Munro and son, Cody Silva  . New Evaluation    Dr. Leonie Man pt, hx of CVA, DM, OSA and seizures  . Loss of Consciousness    Recent hospitalization for syncope, orthostatic hypotension, combined upper, lower and bulbar symptoms. C/o muscle loss and tremor in mouth and upper extremities. Ambulates using cane. Denies any falls over the last couple of months.     HISTORICAL  Cody Silva is a 71 years old right-handed male, accompanied by his wife, and son Cody Silva, seen in refer for hospital follow-up, Initial evaluation was on February 15 2017.  He had long-standing history of diabetes, become insulin-dependent, suffered diabetic retinopathy, blind in his left eye, hypertension, hyperlipidemia, retired Higher education careers adviser in [employer],  In June 2014 he presented with sudden onset right-sided weakness, slurred speech, gait abnormality, personally reviewed MRI of the brain without contrast on February 20 2013, positive DWIs lesion involving the body of corpus callosum, also the cingulate gyri, more on the right side,  He had long history of diabetes, insulin-dependent, hypertension, hyperlipidemia, retired as Higher education careers adviser at [employer]. MRA of the neck showed no significant large vessel, MRA of the brain showed intracranial atherosclerosis.   He was treated with aspirin, had a gradual recovery, with mild residual right arm and leg weakness.  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  Hospital admission on September 12 2016, weight loss of 25 pounds in 3 months, orthostatic blood pressure changes, passing out episode, waking up in the middle of the night to make a sandwich,  woke up on the floor of his kitchen  Loop recorder since April 2017,  showed no significant abnormality, EEG was normal,  Echocardiogram on Jan 10 2017, ejection fraction 55-60%, wall motion was normal.  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, with scattered foci of old microhemorrhage compatible with chronic hypertension, but abnormal MRI findings does not his presentation,  He was found to have significant orthostatic blood pressure changes, symptoms improved with IV fluid, there was also multiple episode of staring spells, confusion, followed by post event confusion, with tentative diagnosis of seizure, he is now taking Depakote DR 500 mg 3 times a day,  EEG showed mild generalized slowing, Echocardiogram showed no significant abnormality Carotid Doppler study showed less than 39% stenosis,  I reviewed laboratory evaluations, in May 2018 elevated glucose 172, normal CPK 98, troponin was slightly elevated 0.0 3, negative UDS, Depakote level was 71, BNP was mildly elevated at 145, CMP showed creatinine 2.2, with estimated GFR 33, CBC showed hemoglobin of 12.1, A1c was elevated 6.7, vitamin B12 451, vitamin D was decreased 15.6, HIV was negative, normal TSH, UDS alcohol level was negative  REVIEW OF SYSTEMS: Full 14 system review of systems performed and notable only for as above   ALLERGIES: Allergies  Allergen Reactions  . Metformin And Related Diarrhea  .  Tape Rash    HOME MEDICATIONS: Current Outpatient Prescriptions  Medication Sig Dispense Refill  . acetaminophen (TYLENOL) 500 MG tablet Take 1 tablet (500 mg total) by mouth every 6 (six) hours as needed for mild  pain, moderate pain, fever or headache. 30 tablet 0  . albuterol (PROVENTIL HFA;VENTOLIN HFA) 108 (90 BASE) MCG/ACT inhaler Inhale 2 puffs into the lungs every 6 (six) hours as needed for wheezing.    [name] aspirin EC 81 MG EC tablet Take 1 tablet (81 mg total) by mouth daily. 30 tablet 0  . atorvastatin (LIPITOR) 20 MG tablet Take 1 tablet (20 mg total) by mouth daily at 6 PM. 30 tablet 3  . carvedilol (COREG) 12.5 MG tablet Take 1 tablet (12.5 mg total) by mouth 2 (two) times daily with a meal. 30 tablet 0  . cetirizine (ZYRTEC) 10 MG tablet Take 10 mg by mouth at bedtime as needed for allergies.    . chlorthalidone (HYGROTON) 25 MG tablet Take 25 mg by mouth daily.    . cloNIDine (CATAPRES - DOSED IN MG/24 HR) 0.2 mg/24hr patch Place 0.2 mg onto the skin once a week.    . clopidogrel (PLAVIX) 75 MG tablet Take 1 tablet (75 mg total) by mouth at bedtime.    . feeding supplement, ENSURE ENLIVE, (ENSURE ENLIVE) LIQD Take 237 mLs by mouth 2 (two) times daily after a meal. 237 mL 12  . fluocinonide cream (LIDEX) 1.61 % Apply 1 application topically daily.     . fluticasone (FLONASE) 50 MCG/ACT nasal spray Place 2 sprays into both nostrils daily as needed for allergies.     [name] gabapentin (NEURONTIN) 300 MG capsule Take 300 mg by mouth at bedtime.     . Ginkgo Biloba (GNP GINGKO BILOBA EXTRACT PO) Take 1 capsule by mouth 2 (two) times a week.     . insulin aspart (NOVOLOG) 100 UNIT/ML injection Inject 5-15 Units into the skin 3 (three) times daily with meals. Per sliding scale    . insulin glargine (LANTUS) 100 UNIT/ML injection Inject 0.05 mLs (5 Units total) into the skin at bedtime. 10 mL 11  . latanoprost (XALATAN) 0.005 % ophthalmic solution Place 1 drop into both eyes at bedtime.     [name] losartan (COZAAR) 100 MG tablet Take 100 mg by mouth daily.    . Multiple Vitamins-Minerals (MULTIVITAMIN PO) Take 1 tablet by mouth daily.    . polyvinyl alcohol (LIQUIFILM TEARS) 1.4 % ophthalmic solution Place 1  drop into both eyes 5 (five) times daily as needed (for dry eyes).    . potassium chloride (K-DUR,KLOR-CON) 10 MEQ tablet Take 10 mEq by mouth 2 (two) times daily.    . sertraline (ZOLOFT) 100 MG tablet Take 100 mg by mouth daily.    . Skin Protectants, Misc. (EUCERIN) cream Apply 1 application topically daily. FEET AND LEGS    . tamsulosin (FLOMAX) 0.4 MG CAPS capsule Take 0.8 mg by mouth at bedtime.    . divalproex (DEPAKOTE) 500 MG DR tablet Take 1 tablet (500 mg total) by mouth 3 (three) times daily. 90 tablet 0   No current facility-administered medications for this visit.     PAST MEDICAL HISTORY: Past Medical History:  Diagnosis Date  . Anemia   . Arthritis    "right arm, right ankle, right side" (10/30/2014)  . Colon polyps   . Diabetic neuropathy ([place])   . Diabetic retinopathy ([place])   . H/O agent Orange exposure   . Headache    "@  least 3 times/wk" (10/30/2014)  . History of blood transfusion 10/30/2014   hematochezia  . History of gout   . Hypertension   . OSA on CPAP    "suppose to wear mask; I've got a call in for an equipment change" (10/30/2014)  . PTSD (post-traumatic stress disorder)    "service related"  . Seizures ([place])   . Stroke [facility]) 2014   left extremity deficits; facial left  . Type II diabetes mellitus ([place])     PAST SURGICAL HISTORY: Past Surgical History:  Procedure Laterality Date  . BONE GRAFT HIP ILIAC CREST Left ~ 1976  . CATARACT EXTRACTION W/ INTRAOCULAR LENS  IMPLANT, BILATERAL Bilateral 2014-2015  . EP IMPLANTABLE DEVICE N/A 12/24/2015   Procedure: Loop Recorder Insertion;  Surgeon: Thompson Grayer, MD;  Location: [facility];  Service: Cardiovascular;  Laterality: N/A;  . TUMOR REMOVAL Right ~ 1976   "arm; had to take bone left hip to add to the repair"    FAMILY HISTORY: Family History  Problem Relation Age of Onset  . Diabetes Mother   . Breast cancer Mother   . Heart attack Mother        CABG - Age 52  . Kidney disease Mother    . Stroke Brother   . Lung disease Father   . Neurofibromatosis Maternal Uncle   . Throat cancer Brother   . Hypertension Brother     SOCIAL HISTORY:  Social History   Social History  . Marital status: Married    Spouse name: sheila  . Number of children: 4  . Years of education: 26   Occupational History  . Retired    Social History Main Topics  . Smoking status: Never Smoker  . Smokeless tobacco: Never Used  . Alcohol use 0.6 oz/week    1 Cans of beer per week     Comment: 10/30/2014 "might have a beer a couple times/yr"  . Drug use: No  . Sexual activity: Yes   Other Topics Concern  . Not on file   Social History Narrative   Patient is married with 4 children   Patient is right handed   Patient has a Water quality scientist degree    Patient 4 cups daily   Retired Engineer, structural. Lives with wife, daughter, cousin in [place].   Independent in ADLs and partially dependent for IADLs.   Ambulates with cane sometimes due to intermittent right leg weakness.      PHYSICAL EXAM   Vitals:   02/15/17 0940  BP: (!) 157/89  Pulse: 65  Weight: 194 lb 8 oz (88.2 kg)  Height: 6' 4.5" (1.943 m)    Not recorded      Body mass index is 23.37 kg/m.  PHYSICAL EXAMNIATION:  Gen: NAD, conversant, well nourised, obese, well groomed                     Cardiovascular: Regular rate rhythm, no peripheral edema, warm, nontender. Eyes: Conjunctivae clear without exudates or hemorrhage Neck: Supple, no carotid bruits. Pulmonary: Clear to auscultation bilaterally   NEUROLOGICAL EXAM:  MENTAL STATUS: Speech:    Speech is normal; fluent and spontaneous with normal comprehension.  Cognition:     Orientation to time, place and person     Normal recent and remote memory     Normal Attention span and concentration     Normal Language, naming, repeating,spontaneous speech     Fund of knowledge   CRANIAL NERVES: CN II:  Visual fields are full to confrontation. Fundoscopic exam is  normal with sharp discs and no vascular changes. Pupils are round equal and briskly reactive to light. CN III, IV, VI: extraocular movement are normal. No ptosis. CN V: Facial sensation is intact to pinprick in all 3 divisions bilaterally. Corneal responses are intact.  CN VII: Face is symmetric with normal eye closure and smile. CN VIII: Hearing is normal to rubbing fingers CN IX, X: Palate elevates symmetrically. Phonation is normal. CN XI: Head turning and shoulder shrug are intact CN XII: Tongue is midline with normal movements and no atrophy.  MOTOR: He has significant bilateral intrinsic hand muscle atrophy, right worse than left, mild spasticity of bilateral upper and lower extremity muscles, shoulder abduction (R/L) 5/5, external rotation  4+/4+, elbow flexion 5/5 elbow extension 5/5 wrist extension 4/4 wrist flexion 5-/5, grip 4/5, finger abduction 3/4, hip flexion 5/5, knee extension 5/5, knee flexion 5/5 ankle dorsi flexion 5/5, plantar flexion 5/5,     REFLEXES: Reflexes are 2+ and symmetric at the biceps, triceps, knees, and absent at  ankles. Plantar responses arExtensor bilaterally   SENSORY length dependent decreased tolight touch, pinprick vibratory sensation to distal shin   COORDINATION: Rapid alternating movements and fine finger movements are intact. There is no dysmetria on finger-to-nose and heel-knee-shin.    GAIT/STANCE: Need to push up to get up from seated position, wide based, stiff, unsteady cautious gait    DIAGNOSTIC DATA (LABS, IMAGING, TESTING) - I reviewed patient records, labs, notes, testing and imaging myself where available.   ASSESSMENT AND PLAN  PHARRELL LEDFORD is a 71 y.o. male    gait abnormality   evidence of bilateral intrinsic hand muscle atrophy and weakness, hyperreflexia  Need to rule out cervical spondylitic myelopathy, proceed with MRI of cervical spine  Likely multifactorial, aging, deconditioning, diabetic peripheral neuropathy,  residual deficit from previous stroke  Passing out falling episodes:  He has significant orthostatic blood pressure changes, lying down blood pressure 120/96, heart rate of 71, standing up 147/87 heart rate of 78,  Most consistent with orthostatic blood pressure change, syncope episode  Laboratory evaluations   Keep well hydration   Marcial Pacas, M.D. Ph.D.  [facility] [address] [place], [place] [address] Ph: [phone] Fax: [phone]  [id number], [facility]

## 2017-02-16 ENCOUNTER — Ambulatory Visit (INDEPENDENT_AMBULATORY_CARE_PROVIDER_SITE_OTHER): Payer: Medicare Other | Admitting: *Deleted

## 2017-02-16 ENCOUNTER — Ambulatory Visit (INDEPENDENT_AMBULATORY_CARE_PROVIDER_SITE_OTHER): Payer: Medicare Other | Admitting: Neurology

## 2017-02-16 ENCOUNTER — Encounter (INDEPENDENT_AMBULATORY_CARE_PROVIDER_SITE_OTHER): Payer: Self-pay | Admitting: Neurology

## 2017-02-16 DIAGNOSIS — E1121 Type 2 diabetes mellitus with diabetic nephropathy: Secondary | ICD-10-CM

## 2017-02-16 DIAGNOSIS — R55 Syncope and collapse: Secondary | ICD-10-CM | POA: Diagnosis not present

## 2017-02-16 DIAGNOSIS — Z0289 Encounter for other administrative examinations: Secondary | ICD-10-CM

## 2017-02-16 DIAGNOSIS — G629 Polyneuropathy, unspecified: Secondary | ICD-10-CM

## 2017-02-16 DIAGNOSIS — G4733 Obstructive sleep apnea (adult) (pediatric): Secondary | ICD-10-CM

## 2017-02-16 DIAGNOSIS — I639 Cerebral infarction, unspecified: Secondary | ICD-10-CM

## 2017-02-16 DIAGNOSIS — E1122 Type 2 diabetes mellitus with diabetic chronic kidney disease: Secondary | ICD-10-CM

## 2017-02-16 DIAGNOSIS — I6330 Cerebral infarction due to thrombosis of unspecified cerebral artery: Secondary | ICD-10-CM

## 2017-02-16 DIAGNOSIS — N183 Chronic kidney disease, stage 3 (moderate): Secondary | ICD-10-CM

## 2017-02-16 NOTE — Procedures (Signed)
Full Name: Cody Silva Gender: Male MRN #: [id number] Date of Birth: [birth year]    Visit Date: 02/16/2017 09:01 Age: 71 Years 64 Months Old Examining Physician: Marcial Pacas, MD  Referring Physician: Leonie Man, MD History: 71 years old male, with long-standing history of diabetes, presented with progressive gait abnormality, presyncope episodes  Summary of the test: Nerve conduction study: Right sural, superficial peroneal, ulnar sensory responses were absent. Right median and radial sensory responses showed severely decreased snap amplitude, with mildly prolonged peak latency.  Right peroneal to EDB motor response showed severely decreased C map amplitude, right tibial motor responses were absent. Right peroneal to tibialis anterior motor responses were normal. Right ulnar motor responses were absent. Right median motor responses showed mildly decreased C map amplitude, and mildly slow conduction velocity.  Electromyography: Selected needle examination was performed at bilateral lower extremity, right upper extremity muscles, right cervical paraspinal muscles bilateral lumbar paraspinal muscles.  There is evidence of active neuropathic changes involving right first dorsal interossei, mild degree chronic neuropathic changes involving right extensor digitorum communis, abductor pollicis brevis. There was no evidence of active denervation at right cervical paraspinal muscles.  There is chronic neuropathic changes at bilateral distal leg muscles. There is no evidence of active denervation at bilateral lumbar sacral paraspinal muscles.  Conclusion: This is an abnormal study. There is electrodiagnostic evidence of length dependent severe sensorimotor polyneuropathy, consistent with his long-standing history of diabetes. 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 There is also  evidence of mild right cervical radiculopathy, mainly involving right C8 T1 myotomes.    ------------------------------- Marcial Pacas, M.D.  [facility] [place], [place] [address] Tel: [phone] Fax: [phone]        [facility]    Nerve / Sites Rec. Site Latency Ref. Amplitude Ref. Rel Amp Segments Distance Velocity Ref. Area    ms ms mV mV %  cm m/s m/s mVms  R Median - APB     Wrist APB 4.2 ?4.4 3.4 ?4.0 100 Wrist - APB 7   8.3     Upper arm APB 10.2  3.4  102 Upper arm - Wrist 27 45 ?49 11.7  R Ulnar - ADM     Wrist ADM NR ?3.3 NR ?6.0 NR Wrist - ADM 7   NR     B.Elbow ADM NR  NR  NR B.Elbow - Wrist   ?49 NR         A.Elbow - Wrist      R Peroneal - EDB     Ankle EDB 6.1 ?6.5 0.4 ?2.0 100 Ankle - EDB 9   1.3     Fib head EDB 16.9  0.3  66.4 Fib head - Ankle 36 33 ?44 0.8     Pop fossa EDB 19.0  0.2  96.4 Pop fossa - Fib head 8 38 ?44 0.6         Pop fossa - Ankle      R Tibial - AH     Ankle AH NR ?5.8 NR ?4.0 NR Ankle - AH 9   NR  R Peroneal - Tib Ant     Fib Head Tib Ant 4.8 ?6.7 2.1 ?3.0 100 Fib Head - Tib Ant 10   6.1     Pop fossa Tib Ant 6.4  1.9  93.3 Pop fossa - Fib  Head 8 50 ?44 5.6               SNC    Nerve / Sites Rec. Site Peak Lat Ref.  Amp Ref. Segments Distance    ms ms V V  cm  R Radial - Anatomical snuff box (Forearm)     Forearm Wrist 3.13 ?2.90 3 ?15 Forearm - Wrist 10  R Sural - Ankle (Calf)     Calf Ankle NR ?4.40 NR ?6 Calf - Ankle 14  R Superficial peroneal - Ankle     Lat leg Ankle NR ?4.40 NR ?6 Lat leg - Ankle 14  R Median - Orthodromic (Dig II, Mid palm)     Dig II Wrist 3.28 ?3.40 2 ?10 Dig II - Wrist 13  R Ulnar - Orthodromic, (Dig V, Mid palm)     Dig V Wrist NR ?3.10 NR ?5 Dig V - Wrist 62               F  Wave    Nerve F Lat Ref.   ms ms  R Median - APB 33.5 ?31.0       EMG full       EMG Summary Table    Spontaneous MUAP Recruitment  Muscle IA Fib PSW Fasc Other Amp Dur. Poly Pattern  R.  Tibialis anterior Increased 2+ 2+ None _______ Increased Increased 1+ Reduced  R. Tibialis posterior Increased 1+ 1+ None _______ Increased Increased Normal Reduced  R. Gastrocnemius (Medial head) Increased None 1+ None _______ Increased Increased Normal Reduced  R. Vastus lateralis Normal None None None _______ Normal Normal Normal Reduced  L. Tibialis anterior Increased 1+ None None _______ Increased Increased Normal Reduced  L. Tibialis posterior Increased 1+ 1+ None _______ Increased Increased Normal Reduced  L. Gastrocnemius (Medial head) Increased None 1+ None _______ Normal Increased 1+ Reduced  L. Vastus lateralis Normal None None None _______ Normal Normal Normal Normal  R. Lumbar paraspinals (low) Normal None None None _______ Normal Normal Normal Normal  R. Lumbar paraspinals (mid) Normal None None None _______ Normal Normal Normal Normal  L. Lumbar paraspinals (mid) Normal None None None _______ Normal Normal Normal Normal  L. Lumbar paraspinals (low) Normal None None None _______ Normal Normal Normal Normal  R. First dorsal interosseous Increased 3+ 3+ None _______ Increased Increased 3+ Discrete  R. Abductor pollicis brevis Increased None None None _______ Normal Normal Normal Reduced  R. Pronator teres Normal None None None _______ Normal Normal Normal Normal  R. Biceps brachii Normal None None None _______ Normal Normal Normal Normal  R. Deltoid Normal None None None _______ Normal Normal Normal Normal  R. Triceps brachii Normal None None None _______ Normal Normal Normal Normal  R. Cervical paraspinals Normal None None None _______ Normal Normal Normal Normal  R. Extensor digitorum communis Increased None None None _______ Increased Increased 1+ Reduced

## 2017-02-16 NOTE — Progress Notes (Signed)
Carelink Summary Report / Loop Recorder 

## 2017-02-18 LAB — C-REACTIVE PROTEIN: CRP: 0.4 mg/L (ref 0.0–4.9)

## 2017-02-18 LAB — HEPATITIS PANEL, ACUTE
Hep A IgM: NEGATIVE
Hep B C IgM: NEGATIVE
Hepatitis B Surface Ag: NEGATIVE

## 2017-02-18 LAB — HGB A1C W/O EAG: HEMOGLOBIN A1C: 6.2 % — AB (ref 4.8–5.6)

## 2017-02-18 LAB — ANA W/REFLEX IF POSITIVE: ANA: NEGATIVE

## 2017-02-18 LAB — MYASTHENIA GRAVIS EVALUATION
AChR Binding Ab, Serum: <0.03 nmol/L (ref 0.00–0.24)
Anti-striation Abs: NEGATIVE

## 2017-02-18 LAB — HIV ANTIBODY (ROUTINE TESTING W REFLEX): HIV SCREEN 4TH GENERATION: NONREACTIVE

## 2017-02-18 LAB — PROTEIN ELECTROPHORESIS
A/G Ratio: 1.1 (ref 0.7–1.7)
ALPHA 1: 0.2 g/dL (ref 0.0–0.4)
ALPHA 2: 0.7 g/dL (ref 0.4–1.0)
Albumin ELP: 3.7 g/dL (ref 2.9–4.4)
Beta: 0.9 g/dL (ref 0.7–1.3)
GAMMA GLOBULIN: 1.6 g/dL (ref 0.4–1.8)
GLOBULIN, TOTAL: 3.3 g/dL (ref 2.2–3.9)
Total Protein: 7 g/dL (ref 6.0–8.5)

## 2017-02-18 LAB — COPPER, SERUM: COPPER: 91 ug/dL (ref 72–166)

## 2017-02-18 LAB — CK: Total CK: 65 U/L (ref 24–204)

## 2017-02-18 LAB — RPR: RPR Ser Ql: NONREACTIVE

## 2017-02-18 LAB — B. BURGDORFI ANTIBODIES

## 2017-02-18 LAB — VALPROIC ACID LEVEL: Valproic Acid Lvl: 88 ug/mL (ref 50–100)

## 2017-02-18 LAB — FOLATE: FOLATE: 8.2 ng/mL (ref >3.0)

## 2017-02-18 LAB — VITAMIN D 25 HYDROXY (VIT D DEFICIENCY, FRACTURES): VIT D 25 HYDROXY: 16.9 ng/mL — AB (ref 30.0–100.0)

## 2017-02-18 LAB — FERRITIN: FERRITIN: 150 ng/mL (ref 30–400)

## 2017-02-19 ENCOUNTER — Encounter: Payer: Self-pay | Admitting: *Deleted

## 2017-02-26 LAB — CUP PACEART REMOTE DEVICE CHECK
Implantable Pulse Generator Implant Date: 20170421
MDC IDC SESS DTM: 20180615174119

## 2017-02-26 NOTE — Progress Notes (Signed)
Carelink summary report received. Battery status OK. Normal device function. No new symptom episodes, tachy episodes, brady, or pause episodes. No new AF episodes. Monthly summary reports and ROV/PRN 

## 2017-02-27 ENCOUNTER — Ambulatory Visit
Admission: RE | Admit: 2017-02-27 | Discharge: 2017-02-27 | Disposition: A | Discharge status: Home / Self Care | Payer: Medicare Other | Source: Ambulatory Visit | Attending: Neurology | Admitting: Neurology

## 2017-02-27 ENCOUNTER — Telehealth: Payer: Self-pay

## 2017-02-27 DIAGNOSIS — I6330 Cerebral infarction due to thrombosis of unspecified cerebral artery: Secondary | ICD-10-CM

## 2017-02-27 DIAGNOSIS — N183 Chronic kidney disease, stage 3 unspecified: Secondary | ICD-10-CM

## 2017-02-27 DIAGNOSIS — E1122 Type 2 diabetes mellitus with diabetic chronic kidney disease: Secondary | ICD-10-CM

## 2017-02-27 DIAGNOSIS — G4733 Obstructive sleep apnea (adult) (pediatric): Secondary | ICD-10-CM

## 2017-02-27 DIAGNOSIS — R55 Syncope and collapse: Secondary | ICD-10-CM

## 2017-02-27 DIAGNOSIS — R269 Unspecified abnormalities of gait and mobility: Secondary | ICD-10-CM | POA: Diagnosis not present

## 2017-02-27 DIAGNOSIS — E1121 Type 2 diabetes mellitus with diabetic nephropathy: Secondary | ICD-10-CM

## 2017-02-27 NOTE — Telephone Encounter (Signed)
Dr.Sethi does not prescribed patients plavix. Pt goes the [place] for some of his medications. Rn fax neuro clearance to [phone] that was listed on form. Dr. Leonie Man gave recommendations on when pt should stop plavix.

## 2017-02-28 NOTE — Telephone Encounter (Signed)
Rn call Dept to [place] in [facility] at [phone] ext [phone]. Rn stated to staff that form was fax yesterday. Rn stated pts [facility] or [facility] mD prescribed the plavix not Dr.Sethi. Rn also stated Dr.Sethi did the neuro clearance for patient to have procedure. Rn stated per Dr. Leonie Man note he is to hold 5 days prior to EGD, and colonoscopy with a small but acceptable peri procedural risk of tia or stroke. The staff stated they receive the form and it will be forward to the surgical staff.

## 2017-03-01 ENCOUNTER — Telehealth: Payer: Self-pay | Admitting: Cardiology

## 2017-03-01 NOTE — Telephone Encounter (Signed)
LMOVM requesting that pt send manual transmission b/c home monitor has not updated in at least 14 days.    

## 2017-03-19 ENCOUNTER — Ambulatory Visit (INDEPENDENT_AMBULATORY_CARE_PROVIDER_SITE_OTHER): Payer: Medicare Other | Admitting: *Deleted

## 2017-03-19 DIAGNOSIS — I639 Cerebral infarction, unspecified: Secondary | ICD-10-CM

## 2017-03-19 NOTE — Progress Notes (Signed)
Carelink Summary Report / Loop Recorder 

## 2017-03-29 LAB — CUP PACEART REMOTE DEVICE CHECK
Date Time Interrogation Session: 20180715181138
MDC IDC PG IMPLANT DT: 20170421

## 2017-03-29 NOTE — Progress Notes (Signed)
Carelink summary report received. Battery status OK. Normal device function. No new symptom episodes, tachy episodes, brady, or pause episodes. No new AF episodes. Monthly summary reports and ROV/PRN 

## 2017-04-04 ENCOUNTER — Encounter: Payer: Self-pay | Admitting: Neurology

## 2017-04-04 ENCOUNTER — Ambulatory Visit (INDEPENDENT_AMBULATORY_CARE_PROVIDER_SITE_OTHER): Payer: Medicare Other | Admitting: Neurology

## 2017-04-04 VITALS — BP 168/95 | HR 68 | Ht 76.5 in | Wt 197.5 lb

## 2017-04-04 DIAGNOSIS — R634 Abnormal weight loss: Secondary | ICD-10-CM | POA: Diagnosis not present

## 2017-04-04 DIAGNOSIS — R531 Weakness: Secondary | ICD-10-CM

## 2017-04-04 DIAGNOSIS — M62541 Muscle wasting and atrophy, not elsewhere classified, right hand: Secondary | ICD-10-CM | POA: Diagnosis not present

## 2017-04-04 NOTE — Patient Instructions (Signed)
    Dorene Grebe. Caress, M.D. Professor, Neurology  New Patient Appointments: [phone]  Returning Patient Appointments: [phone] Department: [phone]

## 2017-04-04 NOTE — Progress Notes (Signed)
PATIENT: Cody Silva DOB: [birth year]  Chief Complaint  Patient presents with  . Gait Abnormality    He is here with his wife, Cody Silva.  They would like to review the results of his MRI scans.     HISTORICAL  Cody Silva is a 71 years old right-handed male, accompanied by his wife, and son Cody Silva, seen in refer for hospital follow-up, Initial evaluation was on February 15 2017.  He had long-standing history of diabetes, become insulin-dependent, suffered diabetic retinopathy, blind in his left eye, hypertension, hyperlipidemia, retired Higher education careers adviser in [employer],  In June 2014 he presented with sudden onset right-sided weakness, slurred speech, gait abnormality, personally reviewed MRI of the brain without contrast on February 20 2013, positive DWIs lesion involving the body of corpus callosum, also the cingulate gyri, more on the right side,  He had long history of diabetes, insulin-dependent, hypertension, hyperlipidemia, retired as Higher education careers adviser at [employer]. MRA of the neck showed no significant large vessel, MRA of the brain showed intracranial atherosclerosis.   He was treated with aspirin, had a gradual recovery, with mild residual right arm and leg weakness.  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  Hospital admission on September 12 2016, weight loss of 25 pounds in 3 months, orthostatic blood pressure changes, passing out episode, waking up in the middle of the night to make a sandwich, woke up on the floor of his kitchen  Loop recorder since April 2017,  showed no significant abnormality, EEG was normal,  Echocardiogram on Jan 10 2017, ejection fraction 55-60%, wall motion was normal.  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, with scattered foci of old microhemorrhage compatible with chronic hypertension, but abnormal MRI findings does not explain his presentation,  He was found to have significant orthostatic blood pressure changes, symptoms improved with IV fluid, there was also multiple episode of staring spells, confusion, followed by post event confusion, with tentative diagnosis of seizure, he is now taking Depakote DR 500 mg 3 times a day,  EEG showed mild generalized slowing, Echocardiogram showed no significant abnormality Carotid Doppler study showed less than 39% stenosis,  I reviewed laboratory evaluations, in May 2018 elevated glucose 172, normal CPK 98, troponin was slightly elevated 0.0 3, negative UDS, Depakote level was 71, BNP was mildly elevated at 145, CMP showed creatinine 2.2, with estimated GFR 33, CBC showed hemoglobin of 12.1, A1c was elevated 6.7, vitamin B12 451, vitamin D was decreased 15.6, HIV was negative, normal TSH, UDS alcohol level was negative  UPDATE April 04 2017: Reviewed laboratory evaluation in June 2018, myasthenia gravis panel was negative, Lyme titer was negative, low vitamin D 17, normal and negative ANA, copper level, ferritin, hepatitis, protein electrophoresis, A1c was mildly elevated 6.2, normal CPK, negative HIV, RPR, CPK, C-reactive protein, folic acid,  EMG nerve conduction study on February 16 2017 showed evidence of length dependent severe axonal peripheral neuropathy, consistent with his long-standing history of diabetes, in addition,  there is evidence of chronic neuropathic changes involving right ulnar innervated muscles, mild active neuropathic changes involving  bilateral lumbar sacral myotomes.  He continue complains significant gradual worsening gait abnormality, lower extremity weakness right worse than left, right hand muscle atrophy, weakness, he denies dysarthria, no dysphagia.  We have reviewed extensive imaging study in June 2018, MRI of the brain no acute abnormality, mild supratentorium small vessel disease. MRI of cervical spine, mild degenerative changes, most severe at C7-T1, severe right and moderate left foraminal stenosis. MRI thoracic spine was within normal limits. MRI lumbar spine showed mild degenerative disc diseasesignificant canal foraminal narrowing  The imaging findings would not explain his progressive course, muscle atrophy, weakness, brisk reflexes, electrodiagnostic study raised the possibility of motor neuron disease, I will refer him to [facility] neuromuscular specialist,   REVIEW OF SYSTEMS: Full 14 system review of systems performed and notable only for as above   ALLERGIES: Allergies  Allergen Reactions  . Metformin And Related Diarrhea  . Tape Rash    HOME MEDICATIONS: Current Outpatient Prescriptions  Medication Sig Dispense Refill  . acetaminophen (TYLENOL) 500 MG tablet Take 1 tablet (500 mg total) by mouth every 6 (six) hours as needed for mild pain, moderate pain, fever or headache. 30 tablet 0  . albuterol (PROVENTIL HFA;VENTOLIN HFA) 108 (90 BASE) MCG/ACT inhaler Inhale 2 puffs into the lungs every 6 (six) hours as needed for wheezing.    [name] aspirin EC 81 MG EC tablet Take 1 tablet (81 mg total) by mouth daily. 30 tablet 0  . atorvastatin (LIPITOR) 20 MG tablet Take 1 tablet (20 mg total) by mouth daily at 6 PM. 30 tablet 3  . carvedilol (COREG) 12.5 MG tablet Take 1 tablet (12.5 mg total) by mouth 2 (two) times daily with a meal. 30 tablet 0  . cetirizine (ZYRTEC) 10 MG tablet Take 10 mg by mouth at bedtime as needed for allergies.    . chlorthalidone (HYGROTON) 25 MG tablet Take 25 mg by mouth daily.    .  Cholecalciferol (VITAMIN D3) 2000 units TABS Take 2,000 Units by mouth daily.    . cloNIDine (CATAPRES - DOSED IN MG/24 HR) 0.2 mg/24hr patch Place 0.2 mg onto the skin once a week.    . clopidogrel (PLAVIX) 75 MG tablet Take 1 tablet (75 mg total) by mouth at bedtime.    . feeding supplement, ENSURE ENLIVE, (ENSURE ENLIVE) LIQD Take 237 mLs by mouth 2 (two) times daily after a meal. 237 mL 12  . fluocinonide cream (LIDEX) 2.95 % Apply 1 application topically daily.     . fluticasone (FLONASE) 50 MCG/ACT nasal spray Place 2 sprays into both nostrils daily as needed for allergies.     [name] gabapentin (NEURONTIN) 300 MG capsule Take 300 mg by mouth at bedtime.     . Ginkgo Biloba (GNP GINGKO BILOBA EXTRACT PO) Take 1 capsule by mouth 2 (two) times a week.     . insulin aspart (NOVOLOG) 100 UNIT/ML injection Inject 5-15 Units into the skin 3 (three) times daily with meals. Per sliding scale    . insulin glargine (LANTUS) 100 UNIT/ML injection Inject 0.05 mLs (5 Units total) into the skin at bedtime. 10 mL 11  . latanoprost (XALATAN) 0.005 % ophthalmic solution Place 1 drop into both eyes at bedtime.     [name] losartan (COZAAR) 100 MG tablet Take 100 mg by mouth daily.    . Multiple Vitamins-Minerals (MULTIVITAMIN PO) Take 1 tablet by mouth daily.    [name]  polyvinyl alcohol (LIQUIFILM TEARS) 1.4 % ophthalmic solution Place 1 drop into both eyes 5 (five) times daily as needed (for dry eyes).    . potassium chloride (K-DUR,KLOR-CON) 10 MEQ tablet Take 10 mEq by mouth 2 (two) times daily.    . sertraline (ZOLOFT) 100 MG tablet Take 100 mg by mouth daily.    . Skin Protectants, Misc. (EUCERIN) cream Apply 1 application topically daily. FEET AND LEGS    . tamsulosin (FLOMAX) 0.4 MG CAPS capsule Take 0.8 mg by mouth at bedtime.    . divalproex (DEPAKOTE) 500 MG DR tablet Take 1 tablet (500 mg total) by mouth 3 (three) times daily. 90 tablet 0   No current facility-administered medications for this visit.     PAST  MEDICAL HISTORY: Past Medical History:  Diagnosis Date  . Anemia   . Arthritis    "right arm, right ankle, right side" (10/30/2014)  . Colon polyps   . Diabetic neuropathy ([place])   . Diabetic retinopathy ([place])   . H/O agent Orange exposure   . Headache    "@ least 3 times/wk" (10/30/2014)  . History of blood transfusion 10/30/2014   hematochezia  . History of gout   . Hypertension   . OSA on CPAP    "suppose to wear mask; I've got a call in for an equipment change" (10/30/2014)  . PTSD (post-traumatic stress disorder)    "service related"  . Seizures ([place])   . Stroke [facility]) 2014   left extremity deficits; facial left  . Type II diabetes mellitus ([place])     PAST SURGICAL HISTORY: Past Surgical History:  Procedure Laterality Date  . BONE GRAFT HIP ILIAC CREST Left ~ 1976  . CATARACT EXTRACTION W/ INTRAOCULAR LENS  IMPLANT, BILATERAL Bilateral 2014-2015  . EP IMPLANTABLE DEVICE N/A 12/24/2015   Procedure: Loop Recorder Insertion;  Surgeon: Thompson Grayer, MD;  Location: [facility];  Service: Cardiovascular;  Laterality: N/A;  . TUMOR REMOVAL Right ~ 1976   "arm; had to take bone left hip to add to the repair"    FAMILY HISTORY: Family History  Problem Relation Age of Onset  . Diabetes Mother   . Breast cancer Mother   . Heart attack Mother        CABG - Age 27  . Kidney disease Mother   . Stroke Brother   . Lung disease Father   . Neurofibromatosis Maternal Uncle   . Throat cancer Brother   . Hypertension Brother     SOCIAL HISTORY:  Social History   Social History  . Marital status: Married    Spouse name: sheila  . Number of children: 4  . Years of education: 66   Occupational History  . Retired    Social History Main Topics  . Smoking status: Never Smoker  . Smokeless tobacco: Never Used  . Alcohol use 0.6 oz/week    1 Cans of beer per week     Comment: 10/30/2014 "might have a beer a couple times/yr"  . Drug use: No  . Sexual activity: Yes    Other Topics Concern  . Not on file   Social History Narrative   Patient is married with 4 children   Patient is right handed   Patient has a Water quality scientist degree    Patient 4 cups daily   Retired Engineer, structural. Lives with wife, daughter, cousin in [place].   Independent in ADLs and partially dependent for IADLs.   Ambulates with cane sometimes  due to intermittent right leg weakness.      PHYSICAL EXAM   Vitals:   04/04/17 1308  BP: (!) 168/95  Pulse: 68  Weight: 197 lb 8 oz (89.6 kg)  Height: 6' 4.5" (1.943 m)    Not recorded      Body mass index is 23.73 kg/m.  PHYSICAL EXAMNIATION:  Gen: NAD, conversant, well nourised, obese, well groomed                     Cardiovascular: Regular rate rhythm, no peripheral edema, warm, nontender. Eyes: Conjunctivae clear without exudates or hemorrhage Neck: Supple, no carotid bruits. Pulmonary: Clear to auscultation bilaterally   NEUROLOGICAL EXAM:  MENTAL STATUS: Speech:    Speech is normal; fluent and spontaneous with normal comprehension.  Cognition:     Orientation to time, place and person     Normal recent and remote memory     Normal Attention span and concentration     Normal Language, naming, repeating,spontaneous speech     Fund of knowledge   CRANIAL NERVES: CN II: Visual fields are full to confrontation. Fundoscopic exam is normal with sharp discs and no vascular changes. Pupils are round equal and briskly reactive to light. CN III, IV, VI: extraocular movement are normal. No ptosis. CN V: Facial sensation is intact to pinprick in all 3 divisions bilaterally. Corneal responses are intact.  CN VII: Face is symmetric with normal eye closure and smile. CN VIII: Hearing is normal to rubbing fingers CN IX, X: Palate elevates symmetrically. Phonation is normal. CN XI: Head turning and shoulder shrug are intact CN XII: Tongue is midline with normal movements and no atrophy.  MOTOR: He has significant  bilateral intrinsic hand muscle atrophy, right worse than left, mild spasticity of bilateral upper and lower extremity muscles, shoulder abduction (R/L) 4/5, external rotation  4/4+, elbow flexion 5/5 elbow extension 5/5 wrist extension 4/4+ wrist flexion 5-/5, grip 4-/4, finger abduction 3/4, hip flexion 5-/5-, knee extension 5/5, knee flexion 5/5 ankle dorsi flexion 5/5, plantar flexion 5/5,     REFLEXES: Reflexes are 2+ and symmetric at the biceps, triceps, knees, and absent at  ankles. Plantar responses are extensor bilaterally   SENSORY length dependent decreased tolight touch, pinprick vibratory sensation to distal shin   COORDINATION: Rapid alternating movements and fine finger movements are intact. There is no dysmetria on finger-to-nose and heel-knee-shin.    GAIT/STANCE: Need to push up to get up from seated position, wide based, stiff, unsteady cautious gait    DIAGNOSTIC DATA (LABS, IMAGING, TESTING) - I reviewed patient records, labs, notes, testing and imaging myself where available.   ASSESSMENT AND PLAN  WARNELL RASNIC is a 71 y.o. male   Progressive painless muscle atrophy, weakness, gait abnormality  He has widespread chronic neuropathic changes at bilateral lumbar sacral, right cervical myotomes. His electrodiagnostic studies also complicated by long-standing history of diabetes, evidence of severe axonal length dependent peripheral neuropathy.   The findings of MRI of neural axis would not explain widespread chronic neuropathic changes, which certainly raise the possibility of motor neuron disease,   Extensive laboratory evaluations failed to identify treatable etiology   I will refer [facility] to [facility] for second opinion   Anti-GM 1 antibody today   Marcial Pacas, M.D. Ph.D.  [facility] [address] [place], [place] [address] Ph: [phone] Fax: [phone]  DU:KGURKY, [place]

## 2017-04-13 LAB — IMMUNOFIXATION ELECTROPHORESIS
IgA/Immunoglobulin A, Serum: 219 mg/dL (ref 61–437)
IgG (Immunoglobin G), Serum: 1428 mg/dL (ref 700–1600)
IgM (Immunoglobulin M), Srm: 57 mg/dL (ref 20–172)
TOTAL PROTEIN: 6.2 g/dL (ref 6.0–8.5)

## 2017-04-13 LAB — GM1 ANTIBODY IGG, IGM

## 2017-04-17 ENCOUNTER — Ambulatory Visit (INDEPENDENT_AMBULATORY_CARE_PROVIDER_SITE_OTHER): Payer: Medicare Other | Admitting: *Deleted

## 2017-04-17 DIAGNOSIS — R55 Syncope and collapse: Secondary | ICD-10-CM | POA: Diagnosis not present

## 2017-04-18 ENCOUNTER — Ambulatory Visit: Payer: Medicare Other | Admitting: Neurology

## 2017-04-21 LAB — CUP PACEART REMOTE DEVICE CHECK
Date Time Interrogation Session: 20180814183820
MDC IDC PG IMPLANT DT: 20170421

## 2017-04-21 NOTE — Progress Notes (Signed)
Carelink summary report received. Battery status OK. Normal device function. No new symptom episodes, tachy episodes, brady, or pause episodes. No new AF episodes. Monthly summary reports and ROV/PRN 

## 2017-04-26 ENCOUNTER — Ambulatory Visit: Payer: Medicare Other | Admitting: Nurse Practitioner

## 2017-04-30 NOTE — Progress Notes (Signed)
Loop Recorder Summary Report 

## 2017-05-03 ENCOUNTER — Emergency Department (HOSPITAL_COMMUNITY)
Admission: EM | Admit: 2017-05-03 | Discharge: 2017-05-03 | Disposition: A | Discharge status: Home / Self Care | Payer: Medicare Other | Attending: Emergency Medicine | Admitting: Emergency Medicine

## 2017-05-03 ENCOUNTER — Encounter (HOSPITAL_COMMUNITY): Payer: Self-pay

## 2017-05-03 DIAGNOSIS — Z79899 Other long term (current) drug therapy: Secondary | ICD-10-CM | POA: Insufficient documentation

## 2017-05-03 DIAGNOSIS — Z794 Long term (current) use of insulin: Secondary | ICD-10-CM | POA: Diagnosis not present

## 2017-05-03 DIAGNOSIS — E11319 Type 2 diabetes mellitus with unspecified diabetic retinopathy without macular edema: Secondary | ICD-10-CM | POA: Diagnosis not present

## 2017-05-03 DIAGNOSIS — R569 Unspecified convulsions: Secondary | ICD-10-CM | POA: Diagnosis present

## 2017-05-03 DIAGNOSIS — I10 Essential (primary) hypertension: Secondary | ICD-10-CM | POA: Diagnosis not present

## 2017-05-03 DIAGNOSIS — E1121 Type 2 diabetes mellitus with diabetic nephropathy: Secondary | ICD-10-CM | POA: Diagnosis not present

## 2017-05-03 DIAGNOSIS — E1140 Type 2 diabetes mellitus with diabetic neuropathy, unspecified: Secondary | ICD-10-CM | POA: Diagnosis not present

## 2017-05-03 LAB — BASIC METABOLIC PANEL
ANION GAP: 6 (ref 5–15)
BUN: 25 mg/dL — ABNORMAL HIGH (ref 6–20)
CHLORIDE: 105 mmol/L (ref 101–111)
CO2: 28 mmol/L (ref 22–32)
Calcium: 9.1 mg/dL (ref 8.9–10.3)
Creatinine, Ser: 2.09 mg/dL — ABNORMAL HIGH (ref 0.61–1.24)
GFR calc Af Amer: 36 mL/min — ABNORMAL LOW (ref >60)
GFR, EST NON AFRICAN AMERICAN: 31 mL/min — AB (ref >60)
Glucose, Bld: 121 mg/dL — ABNORMAL HIGH (ref 65–99)
POTASSIUM: 3.5 mmol/L (ref 3.5–5.1)
SODIUM: 139 mmol/L (ref 135–145)

## 2017-05-03 LAB — CBC
HCT: 29.8 % — ABNORMAL LOW (ref 39.0–52.0)
Hemoglobin: 10.4 g/dL — ABNORMAL LOW (ref 13.0–17.0)
MCH: 31.2 pg (ref 26.0–34.0)
MCHC: 34.9 g/dL (ref 30.0–36.0)
MCV: 89.5 fL (ref 78.0–100.0)
Platelets: 108 10*3/uL — ABNORMAL LOW (ref 150–400)
RBC: 3.33 MIL/uL — ABNORMAL LOW (ref 4.22–5.81)
RDW: 13.1 % (ref 11.5–15.5)
WBC: 3.5 10*3/uL — ABNORMAL LOW (ref 4.0–10.5)

## 2017-05-03 LAB — CBG MONITORING, ED: Glucose-Capillary: 107 mg/dL — ABNORMAL HIGH (ref 65–99)

## 2017-05-03 LAB — VALPROIC ACID LEVEL: Valproic Acid Lvl: <10 ug/mL — ABNORMAL LOW (ref 50.0–100.0)

## 2017-05-03 MED ORDER — VALPROATE SODIUM 500 MG/5ML IV SOLN
500.0000 mg | Freq: Once | INTRAVENOUS | Status: AC
  Administered 2017-05-03: 500 mg via INTRAVENOUS
  Filled 2017-05-03: qty 5

## 2017-05-03 MED ORDER — DIVALPROEX SODIUM 500 MG PO DR TAB: 500.0000 mg | DELAYED_RELEASE_TABLET | Freq: Two times a day (BID) | ORAL | 0 refills | Status: DC

## 2017-05-03 MED ORDER — DIVALPROEX SODIUM 500 MG PO DR TAB
500.0000 mg | DELAYED_RELEASE_TABLET | Freq: Three times a day (TID) | ORAL | 0 refills | Status: DC
Start: 2017-05-03 — End: 2017-08-06

## 2017-05-03 MED ORDER — CARVEDILOL 12.5 MG PO TABS
12.5000 mg | ORAL_TABLET | Freq: Once | ORAL | Status: AC
  Administered 2017-05-03: 12.5 mg via ORAL
  Filled 2017-05-03: qty 1

## 2017-05-03 MED ORDER — HYDRALAZINE HCL 20 MG/ML IJ SOLN
10.0000 mg | Freq: Once | INTRAMUSCULAR | Status: AC
Start: 2017-05-03 — End: 2017-05-03
  Administered 2017-05-03: 10 mg via INTRAVENOUS
  Filled 2017-05-03: qty 1

## 2017-05-03 NOTE — ED Notes (Signed)
Bed: WA21 Expected date:  Expected time:  Means of arrival:  Comments: EMS seizure 

## 2017-05-03 NOTE — ED Provider Notes (Signed)
[place] DEPT Provider Note   CSN: [id number] Arrival date & time: 05/03/17  0998     History   Chief Complaint Chief Complaint  Patient presents with  . Seizures    HPI Cody Silva is a 71 y.o. male. Chief complaint is possible seizure.  HPI  71 year old male. History of previous strokes. Has had some progressive weakness and has had episodes of possible seizures. Is followed by neurology. Has been evaluated for "possible ALS" per his family. Had episode tonight was similar where he lost consciousness and had some shaking in a period of unresponsiveness and a 15-20 minute recovery.  Upon arrival of paramedics was "coming around". Is able to stand and ambulate for them.  Recent illness. No complaint of headache now. No focal neurological symptoms. Did not bite his tongue. No shoulder pain. No incontinence. He states he is taking Dilantin. He and family are both a bit uncertain if he is taking it or not.  Past Medical History:  Diagnosis Date  . Anemia   . Arthritis    "right arm, right ankle, right side" (10/30/2014)  . Colon polyps   . Diabetic neuropathy ([place])   . Diabetic retinopathy ([place])   . H/O agent Orange exposure   . Headache    "@ least 3 times/wk" (10/30/2014)  . History of blood transfusion 10/30/2014   hematochezia  . History of gout   . Hypertension   . OSA on CPAP    "suppose to wear mask; I've got a call in for an equipment change" (10/30/2014)  . PTSD (post-traumatic stress disorder)    "service related"  . Seizures ([place])   . Stroke [facility]) 2014   left extremity deficits; facial left  . Type II diabetes mellitus [facility])     Patient Active Problem List   Diagnosis Date Noted  . Atrophy of muscle of right hand 04/04/2017  . Protein-calorie malnutrition, severe 01/19/2017  . Altered mental status 01/09/2017  . Facial droop 01/09/2017  . Cerebral thrombosis with cerebral infarction 01/09/2017  . Seizures ([place])   . Other hyperlipidemia   . OSA  (obstructive sleep apnea) 09/12/2016  . Weight loss 09/12/2016  . Malnutrition of moderate degree 09/11/2016  . Syncope 09/10/2016  . H/O agent Orange exposure 12/24/2015  . Type 2 diabetes with nephropathy ([place])   . Near syncope 12/22/2015  . History of TIAs 09/14/2015  . CKD stage 3 due to type 2 diabetes mellitus ([place]) 09/14/2015  . Acute lower GI bleeding 10/30/2014  . History of colonic polyps 10/30/2014  . Depression 10/30/2014  . Symptomatic anemia 10/30/2014  . AKI (acute kidney injury) ([place]) 10/30/2014  . Ataxic gait 09/21/2014  . History of CVA (cerebrovascular accident) 02/22/2013  . Abnormal brain scan 02/21/2013  . Dizziness 02/21/2013  . Weakness 02/21/2013  . Diabetes mellitus ([place]) 02/21/2013  . Hypertension 02/21/2013    Past Surgical History:  Procedure Laterality Date  . BONE GRAFT HIP ILIAC CREST Left ~ 1976  . CATARACT EXTRACTION W/ INTRAOCULAR LENS  IMPLANT, BILATERAL Bilateral 2014-2015  . EP IMPLANTABLE DEVICE N/A 12/24/2015   Procedure: Loop Recorder Insertion;  Surgeon: Thompson Grayer, MD;  Location: [facility];  Service: Cardiovascular;  Laterality: N/A;  . TUMOR REMOVAL Right ~ 1976   "arm; had to take bone left hip to add to the repair"       Home Medications    Prior to Admission medications   Medication Sig Start Date End Date Taking? Authorizing Provider  atorvastatin (LIPITOR) 20 MG tablet Take 1 tablet (20 mg total) by mouth daily at 6 PM. 09/12/16  Yes Asencion Partridge, MD  carvedilol (COREG) 12.5 MG tablet Take 1 tablet (12.5 mg total) by mouth 2 (two) times daily with a meal. 01/20/17  Yes Molt, Bethany, DO  chlorthalidone (HYGROTON) 25 MG tablet Take 25 mg by mouth daily.   Yes [provider]  Cholecalciferol (VITAMIN D3) 2000 units TABS Take 2,000 Units by mouth daily.   Yes [provider]  clopidogrel (PLAVIX) 75 MG tablet Take 1 tablet (75 mg total) by mouth at bedtime. Patient taking differently: Take 75 mg by  mouth daily.  11/08/14  Yes Juluis Mire, MD  diclofenac sodium (VOLTAREN) 1 % GEL Apply 2 g topically 4 (four) times daily as needed (pain).   Yes [provider]  feeding supplement, ENSURE ENLIVE, (ENSURE ENLIVE) LIQD Take 237 mLs by mouth 2 (two) times daily after a meal. 09/12/16  Yes Asencion Partridge, MD  gabapentin (NEURONTIN) 300 MG capsule Take 300 mg by mouth at bedtime.    Yes [provider]  insulin aspart (NOVOLOG) 100 UNIT/ML injection Inject 5-15 Units into the skin 3 (three) times daily with meals. Per sliding scale   Yes [provider]  latanoprost (XALATAN) 0.005 % ophthalmic solution Place 1 drop into both eyes at bedtime.    Yes [provider]  losartan (COZAAR) 100 MG tablet Take 100 mg by mouth daily.   Yes [provider]  potassium chloride (K-DUR,KLOR-CON) 10 MEQ tablet Take 10 mEq by mouth 2 (two) times daily.   Yes [provider]  sertraline (ZOLOFT) 100 MG tablet Take 100 mg by mouth daily.   Yes [provider]  Skin Protectants, Misc. (EUCERIN) cream Apply 1 application topically daily. FEET AND LEGS   Yes [provider]  tamsulosin (FLOMAX) 0.4 MG CAPS capsule Take 0.8 mg by mouth at bedtime.   Yes [provider]  acetaminophen (TYLENOL) 500 MG tablet Take 1 tablet (500 mg total) by mouth every 6 (six) hours as needed for mild pain, moderate pain, fever or headache. 11/01/14   Juluis Mire, MD  albuterol (PROVENTIL HFA;VENTOLIN HFA) 108 (90 BASE) MCG/ACT inhaler Inhale 2 puffs into the lungs every 6 (six) hours as needed for wheezing.    [provider]  cetirizine (ZYRTEC) 10 MG tablet Take 10 mg by mouth at bedtime as needed for allergies.    [provider]  cloNIDine (CATAPRES - DOSED IN MG/24 HR) 0.2 mg/24hr patch Place 0.2 mg onto the skin once a week.    [provider]  divalproex (DEPAKOTE) 500 MG DR tablet Take 1 tablet (500 mg total) by mouth 2 (two)  times daily. 05/03/17   Tanna Furry, MD  fluticasone [facility]) 50 MCG/ACT nasal spray Place 2 sprays into both nostrils daily as needed for allergies.     [provider]  Ginkgo Biloba (GNP GINGKO BILOBA EXTRACT PO) Take 1 capsule by mouth 2 (two) times a week.     [provider]  insulin glargine (LANTUS) 100 UNIT/ML injection Inject 0.05 mLs (5 Units total) into the skin at bedtime. 01/20/17   Molt, Bethany, DO  polyvinyl alcohol (LIQUIFILM TEARS) 1.4 % ophthalmic solution Place 1 drop into both eyes 5 (five) times daily as needed (for dry eyes).    [provider]    Family History Family History  Problem Relation Age of Onset  . Diabetes Mother   .  Breast cancer Mother   . Heart attack Mother        CABG - Age 31  . Kidney disease Mother   . Stroke Brother   . Lung disease Father   . Neurofibromatosis Maternal Uncle   . Throat cancer Brother   . Hypertension Brother     Social History Social History  Substance Use Topics  . Smoking status: Never Smoker  . Smokeless tobacco: Never Used  . Alcohol use 0.6 oz/week    1 Cans of beer per week     Comment: 10/30/2014 "might have a beer a couple times/yr"     Allergies   Metformin and related and Tape   Review of Systems Review of Systems  Constitutional: Negative for appetite change, chills, diaphoresis, fatigue and fever.  HENT: Negative for mouth sores, sore throat and trouble swallowing.   Eyes: Negative for visual disturbance.  Respiratory: Negative for cough, chest tightness, shortness of breath and wheezing.   Cardiovascular: Negative for chest pain.  Gastrointestinal: Negative for abdominal distention, abdominal pain, diarrhea, nausea and vomiting.  Endocrine: Negative for polydipsia, polyphagia and polyuria.  Genitourinary: Negative for dysuria, frequency and hematuria.  Musculoskeletal: Negative for gait problem.  Skin: Negative for color change, pallor and rash.  Neurological:  Negative for dizziness, syncope, light-headedness and headaches.  Hematological: Does not bruise/bleed easily.  Psychiatric/Behavioral: Negative for behavioral problems and confusion.     Physical Exam Updated Vital Signs BP (!) 194/99   Pulse 63   Temp 97.9 F (36.6 C) (Oral)   Resp 17   Wt 89.8 kg (198 lb)   SpO2 100%   BMI 23.79 kg/m   Physical Exam  Constitutional: He is oriented to person, place, and time. He appears well-developed and well-nourished. No distress.  HENT:  Head: Normocephalic.  Eyes: Pupils are equal, round, and reactive to light. Conjunctivae are normal. No scleral icterus.  Neck: Normal range of motion. Neck supple. No thyromegaly present.  Cardiovascular: Normal rate and regular rhythm.  Exam reveals no gallop and no friction rub.   No murmur heard. Pulmonary/Chest: Effort normal and breath sounds normal. No respiratory distress. He has no wheezes. He has no rales.  Abdominal: Soft. Bowel sounds are normal. He exhibits no distension. There is no tenderness. There is no rebound.  Musculoskeletal: Normal range of motion.  Neurological: He is alert and oriented to person, place, and time.  Awake. Answers simple questioning appropriate. Normal cranial nerves. No sensorimotor deficit peripherally. I did not ambulate the patient.  Skin: Skin is warm and dry. No rash noted.  Psychiatric: He has a normal mood and affect. His behavior is normal.     ED Treatments / Results  Labs (all labs ordered are listed, but only abnormal results are displayed) Labs Reviewed  VALPROIC ACID LEVEL - Abnormal; Notable for the following:       Result Value   Valproic Acid Lvl <10 (*)    All other components within normal limits  BASIC METABOLIC PANEL - Abnormal; Notable for the following:    Glucose, Bld 121 (*)    BUN 25 (*)    Creatinine, Ser 2.09 (*)    GFR calc non Af Amer 31 (*)    GFR calc Af Amer 36 (*)    All other components within normal limits  CBC -  Abnormal; Notable for the following:    WBC 3.5 (*)    RBC 3.33 (*)    Hemoglobin 10.4 (*)  HCT 29.8 (*)    Platelets 108 (*)    All other components within normal limits  CBG MONITORING, ED - Abnormal; Notable for the following:    Glucose-Capillary 107 (*)    All other components within normal limits    EKG  EKG Interpretation None       Radiology No results found.  Procedures Procedures (including critical care time)  Medications Ordered in ED Medications  valproate (DEPACON) 500 mg in dextrose 5 % 50 mL IVPB (500 mg Intravenous New Bag/Given 05/03/17 2228)  carvedilol (COREG) tablet 12.5 mg (12.5 mg Oral Given 05/03/17 2032)  hydrALAZINE (APRESOLINE) injection 10 mg (10 mg Intravenous Given 05/03/17 2228)     Initial Impression / Assessment and Plan / ED Course  I have reviewed the triage vital signs and the nursing notes.  Pertinent labs & imaging results that were available during my care of the patient were reviewed by me and considered in my medical decision making (see chart for details).    Loaded with IV Depakote. Hemolytically stable. Neurologically at baseline per family. Plan is discharge home. Resume, be diligent about taking your Depakote  Final Clinical Impressions(s) / ED Diagnoses   Final diagnoses:  Seizure ([place])    New Prescriptions New Prescriptions   DIVALPROEX (DEPAKOTE) 500 MG DR TABLET    Take 1 tablet (500 mg total) by mouth 2 (two) times daily.     Tanna Furry, MD 05/03/17 2240

## 2017-05-03 NOTE — ED Notes (Signed)
Discharge instructions reviewed with patient. Patient verbalizes understanding. VSS.   

## 2017-05-03 NOTE — ED Triage Notes (Addendum)
Patient with history of seizures, found unresponsive by family, post-ictal. Family says they left the house to run errands at 1730 and returned at 1800 to find the patient post-ictal. Per EMS report, Patient was AxOx4 when they arrived. CBG= 144. EMS Vitals: HR 60, RR 18, BP 182/100, SpO2 100 RA.  Patient reports he takes Depakote for seizures and has been taking it daily. Patient reports his last seizure was in March 2018. Patient has history of HTN, Stroke with residual weakness to L face and arm, diabetes, and hypercholesterolemia, and seizures.

## 2017-05-17 ENCOUNTER — Ambulatory Visit (INDEPENDENT_AMBULATORY_CARE_PROVIDER_SITE_OTHER): Payer: Medicare Other | Admitting: *Deleted

## 2017-05-17 DIAGNOSIS — I639 Cerebral infarction, unspecified: Secondary | ICD-10-CM | POA: Diagnosis not present

## 2017-05-18 NOTE — Progress Notes (Signed)
Carelink Summary Report / Loop Recorder 

## 2017-05-22 LAB — CUP PACEART REMOTE DEVICE CHECK
Implantable Pulse Generator Implant Date: 20170421
MDC IDC SESS DTM: 20180913183945

## 2017-05-23 ENCOUNTER — Telehealth: Payer: Self-pay | Admitting: Cardiology

## 2017-05-23 NOTE — Telephone Encounter (Signed)
LMOVM requesting that pt send manual transmission b/c home monitor has not updated in at least 14 days.    

## 2017-05-30 ENCOUNTER — Encounter: Payer: Self-pay | Admitting: Cardiology

## 2017-06-18 ENCOUNTER — Ambulatory Visit (INDEPENDENT_AMBULATORY_CARE_PROVIDER_SITE_OTHER): Payer: Medicare Other | Admitting: *Deleted

## 2017-06-18 DIAGNOSIS — I639 Cerebral infarction, unspecified: Secondary | ICD-10-CM

## 2017-06-18 NOTE — Progress Notes (Signed)
Carelink Summary Report / Loop Recorder 

## 2017-06-19 LAB — CUP PACEART REMOTE DEVICE CHECK
MDC IDC PG IMPLANT DT: 20170421
MDC IDC SESS DTM: 20181013184155

## 2017-07-16 ENCOUNTER — Ambulatory Visit (INDEPENDENT_AMBULATORY_CARE_PROVIDER_SITE_OTHER): Payer: Medicare Other | Admitting: *Deleted

## 2017-07-16 DIAGNOSIS — I639 Cerebral infarction, unspecified: Secondary | ICD-10-CM

## 2017-07-17 NOTE — Progress Notes (Signed)
Carelink Summary Report / Loop Recorder 

## 2017-07-23 LAB — CUP PACEART REMOTE DEVICE CHECK
Date Time Interrogation Session: 20181112190903
MDC IDC PG IMPLANT DT: 20170421

## 2017-08-06 ENCOUNTER — Encounter: Payer: Self-pay | Admitting: Neurology

## 2017-08-06 ENCOUNTER — Ambulatory Visit (INDEPENDENT_AMBULATORY_CARE_PROVIDER_SITE_OTHER): Payer: Medicare Other | Admitting: Neurology

## 2017-08-06 VITALS — BP 151/80 | HR 64 | Ht 76.5 in | Wt 199.0 lb

## 2017-08-06 DIAGNOSIS — R531 Weakness: Secondary | ICD-10-CM

## 2017-08-06 DIAGNOSIS — M62541 Muscle wasting and atrophy, not elsewhere classified, right hand: Secondary | ICD-10-CM | POA: Diagnosis not present

## 2017-08-06 DIAGNOSIS — R260 Ataxic gait: Secondary | ICD-10-CM

## 2017-08-06 NOTE — Progress Notes (Signed)
PATIENT: Cody Silva DOB: 02-22-1946  Chief Complaint  Patient presents with  . Follow-up    New room. Here to f/u on weakness/muscle atrophy. Stopped depakote. Was told by [facility] 07/13/17 that he was not having seizures.  Has not had any seizures since stopping medication. Doing well off medication.  . Tremors    Shaking getting worse since last visit. Has started in legs in October. No falls per patient.   . Memory Loss    Wife feels pt memory has gotten worse. Repeats a lot of things.      HISTORICAL  Cody Silva is a 71 years old right-handed male, accompanied by his wife, and son Cody Silva, seen in refer for hospital follow-up, Initial evaluation was on February 15 2017.  He had long-standing history of diabetes, become insulin-dependent, suffered diabetic retinopathy, blind in his left eye, hypertension, hyperlipidemia, retired Higher education careers adviser in [id number],  In June 2014 he presented with sudden onset right-sided weakness, slurred speech, gait abnormality, personally reviewed MRI of the brain without contrast on February 20 2013, positive DWIs lesion involving the body of corpus callosum, also the cingulate gyri, more on the right side,  He had long history of diabetes, insulin-dependent, hypertension, hyperlipidemia, retired as Higher education careers adviser at [id number]. MRA of the neck showed no significant large vessel, MRA of the brain showed intracranial atherosclerosis.   He was treated with aspirin, had a gradual recovery, with mild residual right arm and leg weakness.  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  Hospital admission on September 12 2016, weight loss of 25 pounds in 3 months, orthostatic blood pressure changes, passing out episode, waking  up in the middle of the night to make a sandwich, woke up on the floor of his kitchen  Loop recorder since April 2017,  showed no significant abnormality, EEG was normal,  Echocardiogram on Jan 10 2017, ejection fraction 55-60%, wall motion was normal.  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, with scattered foci of old microhemorrhage compatible with chronic hypertension, but abnormal MRI findings does not explain his presentation,  He was found to have significant orthostatic blood pressure changes, symptoms improved with IV fluid, there was also multiple episode of staring spells, confusion, followed by post event confusion, with tentative diagnosis of seizure, he is now taking Depakote DR 500 mg 3 times a day,  EEG showed mild generalized slowing, Echocardiogram showed no significant abnormality Carotid Doppler study showed less than 39% stenosis,  I reviewed laboratory evaluations, in May 2018 elevated glucose 172, normal CPK 98, troponin was slightly elevated 0.0 3, negative UDS, Depakote level was 71, BNP was mildly elevated at 145, CMP showed creatinine 2.2, with estimated GFR 33, CBC showed hemoglobin of 12.1, A1c was elevated 6.7, vitamin B12 451, vitamin D was decreased 15.6, HIV was negative, normal TSH, UDS alcohol level was negative  UPDATE April 04 2017: Reviewed laboratory evaluation in June 2018, myasthenia gravis panel was negative, Lyme titer  was negative, low vitamin D 17, normal and negative ANA, copper level, ferritin, hepatitis, protein electrophoresis, A1c was mildly elevated 6.2, normal CPK, negative HIV, RPR, CPK, C-reactive protein, folic acid,  EMG  nerve conduction study on February 16 2017 showed evidence of length dependent severe axonal peripheral neuropathy, consistent with his long-standing history of diabetes, in addition, there is evidence of chronic neuropathic changes involving right ulnar innervated muscles, mild active neuropathic changes involving bilateral lumbar sacral myotomes.  He continue complains significant gradual worsening gait abnormality, lower extremity weakness right worse than left, right hand muscle atrophy, weakness, he denies dysarthria, no dysphagia.  We have reviewed extensive imaging study in June 2018, MRI of the brain no acute abnormality, mild supratentorium small vessel disease. MRI of cervical spine, mild degenerative changes, most severe at C7-T1, severe right and moderate left foraminal stenosis. MRI thoracic spine was within normal limits. MRI lumbar spine showed mild degenerative disc diseasesignificant canal foraminal narrowing  The imaging findings would not explain his progressive course, muscle atrophy, weakness, brisk reflexes, electrodiagnostic study raised the possibility of motor neuron disease, I will refer him to [facility],  UPDATE Aug 06 2017: He was evaluated by Dr. Murlean Iba on Jul 14 2017, diabetic peripheral neuropathy, severe right ulnar neuropathy, history of right elbow surgery transposition years ago, multiple stroke in the past, there was noted superimposed proximal leg weakness, weight loss, leg atrophy, left hand atrophy, orthostatic dizziness, bowel and bladder complaints, the plan was to repeat EMG nerve conduction study,  Today wife also reported that he has gradual onset memory loss since October 2018, also developed visual hallucinations, increased bilateral hand tremor, recent onset of bilateral lower extremity tremor, ambulate with a cane,  His mother suffered dementia in her 28s   He has good sense of smell, he has difficulty sleep, mixed up day  and night, he is not driving, he watches TV, "he is hateful", frustrated easily.    REVIEW OF SYSTEMS: Full 14 system review of systems performed and notable only for as above   ALLERGIES: Allergies  Allergen Reactions  . Metformin And Related Diarrhea  . Tape Rash    HOME MEDICATIONS: Current Outpatient Medications  Medication Sig Dispense Refill  . acetaminophen (TYLENOL) 500 MG tablet Take 1 tablet (500 mg total) by mouth every 6 (six) hours as needed for mild pain, moderate pain, fever or headache. 30 tablet 0  . albuterol (PROVENTIL HFA;VENTOLIN HFA) 108 (90 BASE) MCG/ACT inhaler Inhale 2 puffs into the lungs every 6 (six) hours as needed for wheezing.    [name] atorvastatin (LIPITOR) 20 MG tablet Take 1 tablet (20 mg total) by mouth daily at 6 PM. 30 tablet 3  . carvedilol (COREG) 12.5 MG tablet Take 1 tablet (12.5 mg total) by mouth 2 (two) times daily with a meal. 30 tablet 0  . cetirizine (ZYRTEC) 10 MG tablet Take 10 mg by mouth at bedtime as needed for allergies.    . chlorthalidone (HYGROTON) 25 MG tablet Take 25 mg by mouth daily.    . Cholecalciferol (VITAMIN D3) 2000 units TABS Take 2,000 Units by mouth daily.    . cloNIDine (CATAPRES - DOSED IN MG/24 HR) 0.2 mg/24hr patch Place 0.2 mg onto the skin once a week.    . clopidogrel (PLAVIX) 75 MG tablet Take 1 tablet (75 mg total) by mouth at bedtime. (Patient taking differently: Take 75 mg by mouth daily. )    . diclofenac sodium (VOLTAREN) 1 % GEL  Apply 2 g topically 4 (four) times daily as needed (pain).    . feeding supplement, ENSURE ENLIVE, (ENSURE ENLIVE) LIQD Take 237 mLs by mouth 2 (two) times daily after a meal. 237 mL 12  . fluticasone (FLONASE) 50 MCG/ACT nasal spray Place 2 sprays into both nostrils daily as needed for allergies.     [name] gabapentin (NEURONTIN) 300 MG capsule Take 300 mg by mouth at bedtime.     . Ginkgo Biloba (GNP GINGKO BILOBA EXTRACT PO) Take 1 capsule by mouth 2 (two) times a week.     . insulin  aspart (NOVOLOG) 100 UNIT/ML injection Inject 5-15 Units into the skin 3 (three) times daily with meals. Per sliding scale    . insulin glargine (LANTUS) 100 UNIT/ML injection Inject 0.05 mLs (5 Units total) into the skin at bedtime. 10 mL 11  . latanoprost (XALATAN) 0.005 % ophthalmic solution Place 1 drop into both eyes at bedtime.     [name] losartan (COZAAR) 100 MG tablet Take 100 mg by mouth daily.    . polyvinyl alcohol (LIQUIFILM TEARS) 1.4 % ophthalmic solution Place 1 drop into both eyes 5 (five) times daily as needed (for dry eyes).    . potassium chloride (K-DUR,KLOR-CON) 10 MEQ tablet Take 10 mEq by mouth 2 (two) times daily.    . sertraline (ZOLOFT) 100 MG tablet Take 100 mg by mouth daily.    . Skin Protectants, Misc. (EUCERIN) cream Apply 1 application topically daily. FEET AND LEGS    . tamsulosin (FLOMAX) 0.4 MG CAPS capsule Take 0.8 mg by mouth at bedtime.     No current facility-administered medications for this visit.     PAST MEDICAL HISTORY: Past Medical History:  Diagnosis Date  . Anemia   . Arthritis    "right arm, right ankle, right side" (10/30/2014)  . Colon polyps   . Diabetic neuropathy ([place])   . Diabetic retinopathy ([place])   . H/O agent Orange exposure   . Headache    "@ least 3 times/wk" (10/30/2014)  . History of blood transfusion 10/30/2014   hematochezia  . History of gout   . Hypertension   . OSA on CPAP    "suppose to wear mask; I've got a call in for an equipment change" (10/30/2014)  . PTSD (post-traumatic stress disorder)    "service related"  . Seizures ([place])   . Stroke [facility]) 2014   left extremity deficits; facial left  . Type II diabetes mellitus ([place])     PAST SURGICAL HISTORY: Past Surgical History:  Procedure Laterality Date  . BONE GRAFT HIP ILIAC CREST Left ~ 1976  . CATARACT EXTRACTION W/ INTRAOCULAR LENS  IMPLANT, BILATERAL Bilateral 2014-2015  . EP IMPLANTABLE DEVICE N/A 12/24/2015   Procedure: Loop Recorder Insertion;  Surgeon: Thompson Grayer, MD;  Location: [facility];  Service: Cardiovascular;  Laterality: N/A;  . TUMOR REMOVAL Right ~ 1976   "arm; had to take bone left hip to add to the repair"    FAMILY HISTORY: Family History  Problem Relation Age of Onset  . Diabetes Mother   . Breast cancer Mother   . Heart attack Mother        CABG - Age 68  . Kidney disease Mother   . Stroke Brother   . Lung disease Father   . Neurofibromatosis Maternal Uncle   . Throat cancer Brother   . Hypertension Brother     SOCIAL HISTORY:  Social History   Socioeconomic History  .  Marital status: Married    Spouse name: sheila  . Number of children: 4  . Years of education: 77  . Highest education level: Not on file  Social Needs  . Financial resource strain: Not on file  . Food insecurity - worry: Not on file  . Food insecurity - inability: Not on file  . Transportation needs - medical: Not on file  . Transportation needs - non-medical: Not on file  Occupational History  . Occupation: Retired  Tobacco Use  . Smoking status: Never Smoker  . Smokeless tobacco: Never Used  Substance and Sexual Activity  . Alcohol use: Yes    Alcohol/week: 0.6 oz    Types: 1 Cans of beer per week    Comment: 10/30/2014 "might have a beer a couple times/yr"  . Drug use: No  . Sexual activity: Yes  Other Topics Concern  . Not on file  Social History Narrative   Patient is married with 4 children   Patient is right handed   Patient has a Water quality scientist degree    Patient 4 cups daily   Retired Engineer, structural. Lives with wife, daughter, cousin in [place].   Independent in ADLs and partially dependent for IADLs.   Ambulates with cane sometimes due to intermittent right leg weakness.      PHYSICAL EXAM   Vitals:   08/06/17 1419  BP: (!) 151/80  Pulse: 64  SpO2: 98%  Weight: 199 lb (90.3 kg)  Height: 6' 4.5" (1.943 m)    Not recorded      Body mass index is 23.91 kg/m.  PHYSICAL EXAMNIATION:  Gen: NAD,  conversant, well nourised, obese, well groomed                     Cardiovascular: Regular rate rhythm, no peripheral edema, warm, nontender. Eyes: Conjunctivae clear without exudates or hemorrhage Neck: Supple, no carotid bruits. Pulmonary: Clear to auscultation bilaterally   NEUROLOGICAL EXAM: MMSE - Mini Mental State Exam 08/06/2017  Orientation to time 4  Orientation to Place 5  Registration 3  Attention/ Calculation 3  Recall 2  Language- name 2 objects 2  Language- repeat 1  Language- follow 3 step command 3  Language- read & follow direction 1  Write a sentence 1  Copy design 1  Total score 26     CRANIAL NERVES: CN II: Visual fields are full to confrontation. Fundoscopic exam is normal with sharp discs and no vascular changes. Pupils are round equal and briskly reactive to light. CN III, IV, VI: extraocular movement are normal. No ptosis. CN V: Facial sensation is intact to pinprick in all 3 divisions bilaterally. Corneal responses are intact.  CN VII: Face is symmetric with normal eye closure and smile. CN VIII: Hearing is normal to rubbing fingers CN IX, X: Palate elevates symmetrically. Phonation is normal. CN XI: Head turning and shoulder shrug are intact CN XII: Tongue is midline with normal movements and no atrophy.  MOTOR: He has significant bilateral intrinsic hand muscle atrophy, right worse than left, mild spasticity of bilateral upper and lower extremity muscles, shoulder abduction (R/L) 4/5, external rotation  4/4+, elbow flexion 5/5 elbow extension 5/5 wrist extension 4/4+ wrist flexion 5-/5, grip 4-/4, finger abduction 3/4, hip flexion 5-/5-, knee extension 5/5, knee flexion 5/5 ankle dorsi flexion 5/5, plantar flexion 5/5,     REFLEXES: Reflexes are 2  and symmetric at the biceps, triceps, knees, and absent at  ankles. Plantar responses are extensor bilaterally  SENSORY length dependent decreased tolight touch, pinprick vibratory sensation to distal shin     COORDINATION: Rapid alternating movements and fine finger movements are intact. There is no dysmetria on finger-to-nose and heel-knee-shin.    GAIT/STANCE: Need to push up to get up from seated position, wide based, stiff, unsteady cautious gait    DIAGNOSTIC DATA (LABS, IMAGING, TESTING) - I reviewed patient records, labs, notes, testing and imaging myself where available.   ASSESSMENT AND PLAN  Cody Silva is a 71 y.o. male   Progressive painless muscle atrophy, weakness, gait abnormality  He has widespread chronic neuropathic changes at bilateral lumbar sacral, right cervical myotomes. His electrodiagnostic studies also complicated by long-standing history of diabetes, evidence of severe axonal length dependent peripheral neuropathy.   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 Ph.D.  [facility] [address] [place], [place] [address] Ph: [phone] Fax: [phone]  NA:[id number], [place]

## 2017-08-06 NOTE — Patient Instructions (Signed)
Melatonin 3mg every night  

## 2017-08-15 ENCOUNTER — Ambulatory Visit (INDEPENDENT_AMBULATORY_CARE_PROVIDER_SITE_OTHER): Payer: Medicare Other | Admitting: *Deleted

## 2017-08-15 DIAGNOSIS — I639 Cerebral infarction, unspecified: Secondary | ICD-10-CM

## 2017-08-16 NOTE — Progress Notes (Signed)
Carelink Summary Report / Loop Recorder 

## 2017-08-17 ENCOUNTER — Ambulatory Visit: Payer: Medicare Other

## 2017-08-23 LAB — CUP PACEART REMOTE DEVICE CHECK
Implantable Pulse Generator Implant Date: 20170421
MDC IDC SESS DTM: 20181212191137

## 2017-08-24 ENCOUNTER — Encounter: Payer: Self-pay | Admitting: Physical Therapy

## 2017-08-24 ENCOUNTER — Ambulatory Visit: Payer: Medicare Other | Attending: Neurology | Admitting: Physical Therapy

## 2017-08-24 DIAGNOSIS — R2689 Other abnormalities of gait and mobility: Secondary | ICD-10-CM | POA: Insufficient documentation

## 2017-08-24 DIAGNOSIS — R2681 Unsteadiness on feet: Secondary | ICD-10-CM | POA: Insufficient documentation

## 2017-08-24 DIAGNOSIS — M6281 Muscle weakness (generalized): Secondary | ICD-10-CM | POA: Insufficient documentation

## 2017-08-24 NOTE — Therapy (Signed)
[facility] [address] [place], [place], [address] Phone: [phone]   Fax:  [phone]  Physical Therapy Evaluation  Patient Details  Name: Cody Silva MRN: [id number] Date of Birth: 10-03-1953 Referring Provider: Krista Blue   Encounter Date: 08/24/2017  PT End of Session - 08/24/17 1658    Visit Number  1    Number of Visits  13    Date for PT Re-Evaluation  10/23/17    Authorization Type  UHC Medicare, Tricare, NO PTA    PT Start Time  1150    PT Stop Time  1229    PT Time Calculation (min)  39 min    Activity Tolerance  Patient tolerated treatment well    Behavior During Therapy  WFL for tasks assessed/performed       Past Medical History:  Diagnosis Date  . Anemia   . Arthritis    "right arm, right ankle, right side" (10/30/2014)  . Colon polyps   . Diabetic neuropathy ([place])   . Diabetic retinopathy ([place])   . H/O agent Orange exposure   . Headache    "@ least 3 times/wk" (10/30/2014)  . History of blood transfusion 10/30/2014   hematochezia  . History of gout   . Hypertension   . OSA on CPAP    "suppose to wear mask; I've got a call in for an equipment change" (10/30/2014)  . PTSD (post-traumatic stress disorder)    "service related"  . Seizures ([place])   . Stroke [facility]) 2014   left extremity deficits; facial left  . Type II diabetes mellitus ([place])     Past Surgical History:  Procedure Laterality Date  . BONE GRAFT HIP ILIAC CREST Left ~ 1976  . CATARACT EXTRACTION W/ INTRAOCULAR LENS  IMPLANT, BILATERAL Bilateral 2014-2015  . EP IMPLANTABLE DEVICE N/A 12/24/2015   Procedure: Loop Recorder Insertion;  Surgeon: Thompson Grayer, MD;  Location: [facility];  Service: Cardiovascular;  Laterality: N/A;  . TUMOR REMOVAL Right ~ 1976   "arm; had to take bone left hip to add to the repair"    There were no vitals filed for this visit.   Subjective Assessment - 08/24/17 1158    Subjective  Pt  reports issues with his balance, with history of CVA June 2014, with additional minor strokes per patient report.  Pt notices more changes in his balance over the past year.  He reports no falls in the past 6 months, but did have a fall November 2017.  He uses cane.  He has a walker and scooter.      Patient Stated Goals  Pt's goals for physical therapy are to improve balance.    Currently in Pain?  No/denies         [facility] PT Assessment - 08/24/17 1203      Assessment   Medical Diagnosis  ataxic gait, muscle weakness    Referring Provider  Krista Blue    Onset Date/Surgical Date  08/06/17 MD visit      Precautions   Precautions  Fall      Balance Screen   Has the patient fallen in the past 6 months  No    Has the patient had a decrease in activity level because of a fear of falling?   Yes    Is the patient reluctant to leave their home because of a fear of falling?   Yes      Home Environment   Living Environment  Private residence    Living Arrangements  Spouse/significant other    Type of [place] to enter    Entrance Stairs-Number of Steps  1    Entrance Stairs-Rails  None    Home Layout  Two level;1/2 bath on main level    Alternate Level Stairs-Number of Steps  14    Alternate Level Stairs-Rails  Left    [place] - single point;Walker - 2 wheels;Electric scooter      Prior Function   Level of Independence  Independent with basic ADLs;Independent with household mobility with device Does need help with dressing and buttons at times    Vocation  Retired Engineer, structural    Leisure  Enjoys being outdoors      Observation/Other Assessments   Focus on Therapeutic Outcomes (FOTO)   NA      Posture/Postural Control   Posture/Postural Control  Postural limitations    Postural Limitations  Rounded Shoulders;Forward head      ROM / Strength   AROM / PROM / Strength  Strength      Strength   Overall Strength  Deficits    Strength Assessment Site   Hip;Knee;Ankle    Right/Left Hip  Right;Left    Right Hip Flexion  4/5    Left Hip Flexion  4/5    Right/Left Knee  Right;Left    Right Knee Flexion  4/5    Right Knee Extension  4/5    Left Knee Flexion  4+/5    Left Knee Extension  4+/5    Right/Left Ankle  Right;Left    Right Ankle Dorsiflexion  3-/5    Left Ankle Dorsiflexion  3-/5      Transfers   Transfers  Sit to Stand;Stand to Sit    Sit to Stand  6: Modified independent (Device/Increase time);With upper extremity assist;From chair/3-in-1    Stand to Sit  6: Modified independent (Device/Increase time);With upper extremity assist;To chair/3-in-1      Ambulation/Gait   Ambulation/Gait  Yes    Ambulation/Gait Assistance  5: Supervision    Ambulation Distance (Feet)  120 Feet    Assistive device  Straight cane    Gait Pattern  Step-through pattern;Decreased step length - right;Decreased dorsiflexion - right;Narrow base of support;Poor foot clearance - right    Ambulation Surface  Level;Indoor    Gait velocity  20.60 sec = 1.59 ft/sec      Standardized Balance Assessment   Standardized Balance Assessment  Timed Up and Go Test;Berg Balance Test      Berg Balance Test   Sit to Stand  Able to stand  independently using hands    Standing Unsupported  Able to stand safely 2 minutes    Sitting with Back Unsupported but Feet Supported on Floor or Stool  Able to sit safely and securely 2 minutes    Stand to Sit  Controls descent by using hands    Transfers  Able to transfer with verbal cueing and /or supervision    Standing Unsupported with Eyes Closed  Able to stand 10 seconds with supervision    Standing Ubsupported with Feet Together  Able to place feet together independently and stand for 1 minute with supervision    From Standing, Reach Forward with Outstretched Arm  Can reach forward >12 cm safely (5")    From Standing Position, Pick up Object from Floor  Able to pick up shoe, needs supervision  From Standing Position,  Turn to Look Behind Over each Shoulder  Looks behind one side only/other side shows less weight shift    Turn 360 Degrees  Able to turn 360 degrees safely but slowly    Standing Unsupported, Alternately Place Feet on Step/Stool  Able to complete >2 steps/needs minimal assist    Standing Unsupported, One Foot in Front  Able to take small step independently and hold 30 seconds    Standing on One Leg  Unable to try or needs assist to prevent fall    Total Score  36    Berg comment:  Scores <45/56 indicate increased fall risk (at end of PT 11/2015, pt's Berg was 52/56)      Timed Up and Go Test   Normal TUG (seconds)  19.85    TUG Comments  Scores >13.5 seconds indicate increased fall risk.             Objective measurements completed on examination: See above findings.                PT Short Term Goals - 08/24/17 1704      PT SHORT TERM GOAL #1   Title  Pt will perform HEP with family supervision, for strength, balance and gait.  TARGET 09/21/17    Time  4    Period  Weeks    Status  New    Target Date  09/21/17      PT SHORT TERM GOAL #2   Title  Pt will improve TUG score to less than or equal to 15 seconds for decreased fall risk.      Time  4    Period  Weeks    Status  New    Target Date  09/21/17      PT SHORT TERM GOAL #3   Title  Pt will improve Berg Balance score to at least 41/56 for decreased fall risk.    Time  4    Period  Weeks    Status  New    Target Date  09/21/17      PT SHORT TERM GOAL #4   Title  Pt will perform at least 6 of 10 reps of sit<>stand transfers with minimal to no UE support for imrpoved funcitonal lower extremity stregnthening and transfers.    Time  4    Period  Weeks    Status  New    Target Date  09/21/17        PT Long Term Goals - 08/24/17 1707      PT LONG TERM GOAL #1   Title  Pt will verbalize understanding of fall prevention in the home environment.  TARGET 10/05/17    Time  6    Period  Weeks    Status  New     Target Date  10/05/17      PT LONG TERM GOAL #2   Title  Pt will improve TUG score to less than or equal to 13.5 seconds for decreased fall risk.      Time  6    Period  Weeks    Status  New    Target Date  10/05/17      PT LONG TERM GOAL #3   Title  Pt will improve Berg BAlance score to at least 48/56 for decreased fall risk.    Time  6    Period  Weeks    Target Date  10/05/17  PT LONG TERM GOAL #4   Title  Pt will improve gait velocity to at least 2 ft/sec for improved gait efficiency and safety.    Time  6    Period  Weeks    Status  New    Target Date  10/05/17      PT LONG TERM GOAL #5   Title  Pt will ambulate at least 500 ft, indoor and outdoor surfaces, modified independenlty and no LOB, for improved community gait.    Time  6    Period  Weeks    Status  New    Target Date  10/05/17             Plan - 2017/09/09 1659    Clinical Impression Statement  PT is a 71 year old male who presents to OP PT with ataxic gait and weakness, with a history of CVAs, DM, HTN, neuropathy.  He feels his balance has declined over the past year, to the point he doesn't like to go out to restaurants or stores for fear of falling.  He presents with decreased strength, abnormal posture, decreased balance, overall slowed mobility, decreased independence with gait.  He is at risk of falls per Berg, TUG and gait velocity scores.  He would benefit from skilled PT to address the above stated deficits and to work to decrease fall risk and improve functional mobility.    History and Personal Factors relevant to plan of care:  >3 co-morbidities, hx of multiple CVAs, fear of falling (balance declined from 11/2016 Merrilee Jansky 52/56 to 36/56 today)    Clinical Presentation  Evolving    Clinical Presentation due to:  at fall risk per Berg, TUG, gait velocity measures; medical history    Clinical Decision Making  Moderate    Rehab Potential  Good    PT Frequency  2x / week    PT Duration  6 weeks plus  eval    PT Treatment/Interventions  ADLs/Self Care Home Management;DME Instruction;Gait training;Stair training;Functional mobility training;Therapeutic activities;Therapeutic exercise;Balance training;Neuromuscular re-education;Patient/family education    PT Next Visit Plan  Initiate HEP for strength, balance (consider OTAGO); walking program for home    Consulted and Agree with Plan of Care  Patient       Patient will benefit from skilled therapeutic intervention in order to improve the following deficits and impairments:  Abnormal gait, Decreased balance, Decreased endurance, Decreased mobility, Difficulty walking, Decreased strength, Postural dysfunction  Visit Diagnosis: Other abnormalities of gait and mobility  Muscle weakness (generalized)  Unsteadiness on feet  G-Codes - September 09, 2017 1710    Functional Assessment Tool Used (Outpatient Only)  gait velocity 1.59 ft/sec, TUG 19.85 sec, Berg 36/56    Functional Limitation  Mobility: Walking and moving around    Mobility: Walking and Moving Around Current Status [phone])  At least 40 percent but less than 60 percent impaired, limited or restricted    Mobility: Walking and Moving Around Goal Status [phone])  At least 20 percent but less than 40 percent impaired, limited or restricted        Problem List Patient Active Problem List   Diagnosis Date Noted  . Atrophy of muscle of right hand 04/04/2017  . Protein-calorie malnutrition, severe 01/19/2017  . Altered mental status 01/09/2017  . Facial droop 01/09/2017  . Cerebral thrombosis with cerebral infarction 01/09/2017  . Seizures ([place])   . Other hyperlipidemia   . OSA (obstructive sleep apnea) 09/12/2016  . Weight loss 09/12/2016  . Malnutrition of  moderate degree 09/11/2016  . Syncope 09/10/2016  . H/O agent Orange exposure 12/24/2015  . Type 2 diabetes with nephropathy ([place])   . Near syncope 12/22/2015  . History of TIAs 09/14/2015  . CKD stage 3 due to type 2 diabetes  mellitus ([place]) 09/14/2015  . Acute lower GI bleeding 10/30/2014  . History of colonic polyps 10/30/2014  . Depression 10/30/2014  . Symptomatic anemia 10/30/2014  . AKI (acute kidney injury) ([place]) 10/30/2014  . Ataxic gait 09/21/2014  . History of CVA (cerebrovascular accident) 02/22/2013  . Abnormal brain scan 02/21/2013  . Dizziness 02/21/2013  . Weakness 02/21/2013  . Diabetes mellitus ([place]) 02/21/2013  . Hypertension 02/21/2013    Paeton Studer W. 08/24/2017, 5:11 PM  Frazier Butt., PT   [facility] [address] [place], [place], [address] Phone: [phone]   Fax:  [phone]  Name: VERDELL DYKMAN MRN: [id number] Date of Birth: 10-15-1945

## 2017-09-05 ENCOUNTER — Ambulatory Visit: Payer: Medicare Other | Admitting: Physical Therapy

## 2017-09-10 ENCOUNTER — Ambulatory Visit: Payer: Medicare Other | Admitting: Physical Therapy

## 2017-09-12 ENCOUNTER — Ambulatory Visit: Payer: Medicare Other | Attending: Neurology | Admitting: Physical Therapy

## 2017-09-12 ENCOUNTER — Encounter: Payer: Self-pay | Admitting: Physical Therapy

## 2017-09-12 VITALS — BP 226/126 | HR 79

## 2017-09-12 DIAGNOSIS — R2689 Other abnormalities of gait and mobility: Secondary | ICD-10-CM | POA: Insufficient documentation

## 2017-09-12 DIAGNOSIS — R2681 Unsteadiness on feet: Secondary | ICD-10-CM | POA: Insufficient documentation

## 2017-09-12 DIAGNOSIS — M6281 Muscle weakness (generalized): Secondary | ICD-10-CM | POA: Diagnosis present

## 2017-09-12 NOTE — Therapy (Signed)
[facility] [address] [place], [place], [address] Phone: [phone]   Fax:  [phone]  Physical Therapy Treatment  Patient Details  Name: Cody Silva MRN: [id number] Date of Birth: March 30, 1946 Referring Provider: Krista Blue   Encounter Date: 09/12/2017  PT End of Session - 09/12/17 1343    Visit Number  2    Number of Visits  13    Date for PT Re-Evaluation  10/23/17    Authorization Type  UHC Medicare, Tricare, NO PTA    PT Start Time  1315    PT Stop Time  1340    PT Time Calculation (min)  25 min    Activity Tolerance  Patient tolerated treatment well    Behavior During Therapy  WFL for tasks assessed/performed       Past Medical History:  Diagnosis Date  . Anemia   . Arthritis    "right arm, right ankle, right side" (10/30/2014)  . Colon polyps   . Diabetic neuropathy ([place])   . Diabetic retinopathy ([place])   . H/O agent Orange exposure   . Headache    "@ least 3 times/wk" (10/30/2014)  . History of blood transfusion 10/30/2014   hematochezia  . History of gout   . Hypertension   . OSA on CPAP    "suppose to wear mask; I've got a call in for an equipment change" (10/30/2014)  . PTSD (post-traumatic stress disorder)    "service related"  . Seizures ([place])   . Stroke [facility]) 2014   left extremity deficits; facial left  . Type II diabetes mellitus ([place])     Past Surgical History:  Procedure Laterality Date  . BONE GRAFT HIP ILIAC CREST Left ~ 1976  . CATARACT EXTRACTION W/ INTRAOCULAR LENS  IMPLANT, BILATERAL Bilateral 2014-2015  . EP IMPLANTABLE DEVICE N/A 12/24/2015   Procedure: Loop Recorder Insertion;  Surgeon: Thompson Grayer, MD;  Location: [facility];  Service: Cardiovascular;  Laterality: N/A;  . TUMOR REMOVAL Right ~ 1976   "arm; had to take bone left hip to add to the repair"    Vitals:   09/12/17 1321 09/12/17 1325  BP: (!) 215/120 (!) 226/126  Pulse:  79  SpO2:  99%     Subjective Assessment - 09/12/17 1317    Subjective  denies any changes or falls; has missed a couple weeks due to the holidays. has had a headache since this morning.     Patient Stated Goals  Pt's goals for physical therapy are to improve balance.    Currently in Pain?  Yes    Pain Score  5     Pain Location  Head    Pain Orientation  Right    Pain Descriptors / Indicators  Aching;Headache    Pain Onset  Today this AM    Pain Frequency  Constant               See vitals above; advised pt and son to go straight to ED due to BP significantly elevated and unable to speak with nurse at [facility].  Pt and son in agreement.  Educated on concerns and possible consequences of elevated BP, pt and son appreciative of PT assistance.               PT Education - 09/12/17 1342    Education provided  Yes    Education Details  see note, recommend pt go to ED    Person(s) Educated  Patient;Child(ren)    Methods  Explanation    Comprehension  Verbalized understanding       PT Short Term Goals - 08/24/17 1704      PT SHORT TERM GOAL #1   Title  Pt will perform HEP with family supervision, for strength, balance and gait.  TARGET 09/21/17    Time  4    Period  Weeks    Status  New    Target Date  09/21/17      PT SHORT TERM GOAL #2   Title  Pt will improve TUG score to less than or equal to 15 seconds for decreased fall risk.      Time  4    Period  Weeks    Status  New    Target Date  09/21/17      PT SHORT TERM GOAL #3   Title  Pt will improve Berg Balance score to at least 41/56 for decreased fall risk.    Time  4    Period  Weeks    Status  New    Target Date  09/21/17      PT SHORT TERM GOAL #4   Title  Pt will perform at least 6 of 10 reps of sit<>stand transfers with minimal to no UE support for imrpoved funcitonal lower extremity stregnthening and transfers.    Time  4    Period  Weeks    Status  New    Target Date  09/21/17        PT Long Term Goals  - 08/24/17 1707      PT LONG TERM GOAL #1   Title  Pt will verbalize understanding of fall prevention in the home environment.  TARGET 10/05/17    Time  6    Period  Weeks    Status  New    Target Date  10/05/17      PT LONG TERM GOAL #2   Title  Pt will improve TUG score to less than or equal to 13.5 seconds for decreased fall risk.      Time  6    Period  Weeks    Status  New    Target Date  10/05/17      PT LONG TERM GOAL #3   Title  Pt will improve Berg BAlance score to at least 48/56 for decreased fall risk.    Time  6    Period  Weeks    Target Date  10/05/17      PT LONG TERM GOAL #4   Title  Pt will improve gait velocity to at least 2 ft/sec for improved gait efficiency and safety.    Time  6    Period  Weeks    Status  New    Target Date  10/05/17      PT LONG TERM GOAL #5   Title  Pt will ambulate at least 500 ft, indoor and outdoor surfaces, modified independenlty and no LOB, for improved community gait.    Time  6    Period  Weeks    Status  New    Target Date  10/05/17            Plan - 09/12/17 1343    Clinical Impression Statement  Pt arrived to session reporting headache since this morning, and that he didn't take his BP medication this morning.  BP checked with automatic cuff and then manually 5 min later and significantly elevated.  [facility] to speak [facility], so left message and advised pt and son to go to ED.  Both in agreement and son agreed to take pt straight to ED.    PT Treatment/Interventions  ADLs/Self Care Home Management;DME Instruction;Gait training;Stair training;Functional mobility training;Therapeutic activities;Therapeutic exercise;Balance training;Neuromuscular re-education;Patient/family education    PT Next Visit Plan  Initiate HEP for strength, balance (consider OTAGO); walking program for home; monitor BP!    Consulted and Agree with Plan of Care  Patient;Family member/caregiver    Family Member  Consulted  son       Patient will benefit from skilled therapeutic intervention in order to improve the following deficits and impairments:  Abnormal gait, Decreased balance, Decreased endurance, Decreased mobility, Difficulty walking, Decreased strength, Postural dysfunction  Visit Diagnosis: Other abnormalities of gait and mobility  Muscle weakness (generalized)  Unsteadiness on feet     Problem List Patient Active Problem List   Diagnosis Date Noted  . Atrophy of muscle of right hand 04/04/2017  . Protein-calorie malnutrition, severe 01/19/2017  . Altered mental status 01/09/2017  . Facial droop 01/09/2017  . Cerebral thrombosis with cerebral infarction 01/09/2017  . Seizures ([place])   . Other hyperlipidemia   . OSA (obstructive sleep apnea) 09/12/2016  . Weight loss 09/12/2016  . Malnutrition of moderate degree 09/11/2016  . Syncope 09/10/2016  . H/O agent Orange exposure 12/24/2015  . Type 2 diabetes with nephropathy ([place])   . Near syncope 12/22/2015  . History of TIAs 09/14/2015  . CKD stage 3 due to type 2 diabetes mellitus ([place]) 09/14/2015  . Acute lower GI bleeding 10/30/2014  . History of colonic polyps 10/30/2014  . Depression 10/30/2014  . Symptomatic anemia 10/30/2014  . AKI (acute kidney injury) ([place]) 10/30/2014  . Ataxic gait 09/21/2014  . History of CVA (cerebrovascular accident) 02/22/2013  . Abnormal brain scan 02/21/2013  . Dizziness 02/21/2013  . Weakness 02/21/2013  . Diabetes mellitus ([place]) 02/21/2013  . Hypertension 02/21/2013      Laureen Abrahams, PT, DPT 09/12/17 1:46 PM    [facility] [address] [place], [place], [address] Phone: [phone]   Fax:  [phone]  Name: PIOTR CHRISTOPHER MRN: [id number] Date of Birth: 07/20/1946

## 2017-09-14 ENCOUNTER — Ambulatory Visit (INDEPENDENT_AMBULATORY_CARE_PROVIDER_SITE_OTHER): Payer: Medicare Other | Admitting: *Deleted

## 2017-09-14 DIAGNOSIS — I639 Cerebral infarction, unspecified: Secondary | ICD-10-CM

## 2017-09-18 NOTE — Progress Notes (Signed)
Carelink Summary Report / Loop Recorder 

## 2017-09-19 ENCOUNTER — Ambulatory Visit: Payer: Medicare Other | Admitting: Physical Therapy

## 2017-09-20 ENCOUNTER — Encounter: Payer: Self-pay | Admitting: Rehabilitation

## 2017-09-20 ENCOUNTER — Ambulatory Visit: Payer: Medicare Other | Admitting: Rehabilitation

## 2017-09-20 DIAGNOSIS — R2681 Unsteadiness on feet: Secondary | ICD-10-CM

## 2017-09-20 DIAGNOSIS — R2689 Other abnormalities of gait and mobility: Secondary | ICD-10-CM

## 2017-09-20 DIAGNOSIS — M6281 Muscle weakness (generalized): Secondary | ICD-10-CM

## 2017-09-20 NOTE — Patient Instructions (Signed)
Sit to Stand: Head Upright   With head upright, stand up slowly with eyes open.  Make sure you have chair against the wall, stand up as tall as you can and use your arms as little as possible.  Repeat __10__ times per session. Do _2___ sessions per day.  Copyright  VHI. All rights reserved.   Standing Hip Extension    Stand at chair or counter top for support.  Bring leg back as far as possible. Repeat with other leg.  Stand tall.  Repeat _10___ times. Do _2___ sessions per day.  http://gt2.exer.us/395   Copyright  VHI. All rights reserved.   HIP: Abduction - Standing    Squeeze glutes. Raise leg out and slightly back. _10__ reps per set, _2__ sets per day, _5__ days per week Hold onto a support like the counter top.   Copyright  VHI. All rights reserved.    Mini-Squats (Standing)    Stand with support. Bend knees slightly. Tighten pelvic floor. Hold for _5__ seconds. Return to straight standing. Relax for _5__ seconds. Repeat 10___ times. Do __2_ times a day.  Copyright  VHI. All rights reserved.

## 2017-09-20 NOTE — Therapy (Signed)
[facility] [address] [place], [place], [address] Phone: [phone]   Fax:  [phone]  Physical Therapy Treatment  Patient Details  Name: Cody Silva MRN: [id number] Date of Birth: [birth year] Referring Provider: Krista Blue   Encounter Date: 09/20/2017  PT End of Session - 09/20/17 1711    Visit Number  3    Number of Visits  13    Date for PT Re-Evaluation  10/23/17    Authorization Type  UHC Medicare, Tricare, NO PTA    PT Start Time  1400    PT Stop Time  1446    PT Time Calculation (min)  46 min    Activity Tolerance  Patient tolerated treatment well    Behavior During Therapy  WFL for tasks assessed/performed       Past Medical History:  Diagnosis Date  . Anemia   . Arthritis    "right arm, right ankle, right side" (10/30/2014)  . Colon polyps   . Diabetic neuropathy ([place])   . Diabetic retinopathy ([place])   . H/O agent Orange exposure   . Headache    "@ least 3 times/wk" (10/30/2014)  . History of blood transfusion 10/30/2014   hematochezia  . History of gout   . Hypertension   . OSA on CPAP    "suppose to wear mask; I've got a call in for an equipment change" (10/30/2014)  . PTSD (post-traumatic stress disorder)    "service related"  . Seizures ([place])   . Stroke [facility]) 2014   left extremity deficits; facial left  . Type II diabetes mellitus ([place])     Past Surgical History:  Procedure Laterality Date  . BONE GRAFT HIP ILIAC CREST Left ~ 1976  . CATARACT EXTRACTION W/ INTRAOCULAR LENS  IMPLANT, BILATERAL Bilateral 2014-2015  . EP IMPLANTABLE DEVICE N/A 12/24/2015   Procedure: Loop Recorder Insertion;  Surgeon: Thompson Grayer, MD;  Location: [facility];  Service: Cardiovascular;  Laterality: N/A;  . TUMOR REMOVAL Right ~ 1976   "arm; had to take bone left hip to add to the repair"    There were no vitals filed for this visit.  Subjective Assessment - 09/20/17 1402    Subjective  Denies  changes or falls.  He states BP has been better at home.     Patient Stated Goals  Pt's goals for physical therapy are to improve balance.    Currently in Pain?  No/denies                      [facility] Adult PT Treatment/Exercise - 09/20/17 1418      Transfers   Five time sit to stand comments   24.22 secs, needs more UE support following 2 reps      Ambulation/Gait   Ambulation/Gait  Yes    Ambulation/Gait Assistance  5: Supervision    Ambulation Distance (Feet)  200 Feet    Assistive device  Straight cane    Ambulation Surface  Level;Indoor      Standardized Balance Assessment   Standardized Balance Assessment  Berg Balance Test;Timed Up and Go Test      Berg Balance Test   Sit to Stand  Able to stand  independently using hands    Standing Unsupported  Able to stand safely 2 minutes    Sitting with Back Unsupported but Feet Supported on Floor or Stool  Able to sit safely and securely 2 minutes    Stand  to Sit  Controls descent by using hands    Transfers  Able to transfer safely, minor use of hands    Standing Unsupported with Eyes Closed  Able to stand 10 seconds with supervision    Standing Ubsupported with Feet Together  Needs help to attain position but able to stand for 30 seconds with feet together    From Standing, Reach Forward with Outstretched Arm  Can reach forward >12 cm safely (5")    From Standing Position, Pick up Object from Floor  Able to pick up shoe, needs supervision    From Standing Position, Turn to Look Behind Over each Shoulder  Looks behind one side only/other side shows less weight shift    Turn 360 Degrees  Able to turn 360 degrees safely but slowly    Standing Unsupported, Alternately Place Feet on Step/Stool  Able to complete >2 steps/needs minimal assist    Standing Unsupported, One Foot in Front  Able to plae foot ahead of the other independently and hold 30 seconds    Standing on One Leg  Tries to lift leg/unable to hold 3 seconds but  remains standing independently    Total Score  38      Timed Up and Go Test   TUG  Normal TUG    Normal TUG (seconds)  16.12 w/ cane      Self-Care   Self-Care  Other Self-Care Comments    Other Self-Care Comments   Pt states that he was told to monitor BP for a week and keep track of them and call them back after a week.  Pt had not called as of yet but states BP has been 160's/80's at home.  Note BP was 177/97 at start of session and 151/85 following activity.  Recommended that he call MD today or tomorrow and let them know and also to get parameters for therapy if possible.  Also discussed increasing activity at home walking in the home until can walk outdoors.  Educated on walking consecutively and keeping track of time as to continue to progress.        Exercises   Exercises  Other Exercises    Other Exercises   Provided pt with initial HEP for BLE strength, see pt instruction for details.               PT Education - 09/20/17 1919    Education provided  Yes    Education Details  See self care    Person(s) Educated  Patient    Methods  Explanation    Comprehension  Verbalized understanding       PT Short Term Goals - 09/20/17 1410      PT SHORT TERM GOAL #1   Title  Pt will perform HEP with family supervision, for strength, balance and gait.  TARGET 09/21/17    Baseline  initiated 09/20/17    Time  4    Period  Weeks    Status  Not Met      PT SHORT TERM GOAL #2   Title  Pt will improve TUG score to less than or equal to 15 seconds for decreased fall risk.      Baseline  16.12 secs with SPC on 09/20/17    Time  4    Period  Weeks    Status  Not Met      PT SHORT TERM GOAL #3   Title  Pt will improve Berg Balance score to at  least 41/56 for decreased fall risk.    Baseline  38/56 on 09/20/17    Time  4    Period  Weeks    Status  Not Met      PT SHORT TERM GOAL #4   Title  Pt will perform at least 6 of 10 reps of sit<>stand transfers with minimal to no UE  support for imrpoved funcitonal lower extremity stregnthening and transfers.    Baseline  5TSS 24.22 secs with no UE support x 2 reps, but needing both hands for remaining reps.     Time  4    Period  Weeks    Status  Not Met        PT Long Term Goals - 08/24/17 1707      PT LONG TERM GOAL #1   Title  Pt will verbalize understanding of fall prevention in the home environment.  TARGET 10/05/17    Time  6    Period  Weeks    Status  New    Target Date  10/05/17      PT LONG TERM GOAL #2   Title  Pt will improve TUG score to less than or equal to 13.5 seconds for decreased fall risk.      Time  6    Period  Weeks    Status  New    Target Date  10/05/17      PT LONG TERM GOAL #3   Title  Pt will improve Berg BAlance score to at least 48/56 for decreased fall risk.    Time  6    Period  Weeks    Target Date  10/05/17      PT LONG TERM GOAL #4   Title  Pt will improve gait velocity to at least 2 ft/sec for improved gait efficiency and safety.    Time  6    Period  Weeks    Status  New    Target Date  10/05/17      PT LONG TERM GOAL #5   Title  Pt will ambulate at least 500 ft, indoor and outdoor surfaces, modified independenlty and no LOB, for improved community gait.    Time  6    Period  Weeks    Status  New    Target Date  10/05/17            Plan - 09/20/17 1711    Clinical Impression Statement  Skilled session focused on brief assessment of STGs/current status due to only being seen once since evaluation in Dec.  Pt continues to demonstrate fall risk with BERG balance score of 38/56 indicative of elevated fall risk, TUG time of 16.12 secs indicative of increased fall risk and decreased functional strength with 5TSS of 24.22 secs.  Initiated HEP today for BLE strengthening.  Note BP elevated during session, but within parameters to complete PT session.      PT Treatment/Interventions  ADLs/Self Care Home Management;DME Instruction;Gait training;Stair  training;Functional mobility training;Therapeutic activities;Therapeutic exercise;Balance training;Neuromuscular re-education;Patient/family education    PT Next Visit Plan  Check compliance with HEP, add balance (consider OTAGO); walking program for home; monitor BP!    Consulted and Agree with Plan of Care  Patient    Family Member Consulted  --       Patient will benefit from skilled therapeutic intervention in order to improve the following deficits and impairments:  Abnormal gait, Decreased balance, Decreased endurance, Decreased mobility, Difficulty walking, Decreased strength,  Postural dysfunction  Visit Diagnosis: Other abnormalities of gait and mobility  Muscle weakness (generalized)  Unsteadiness on feet     Problem List Patient Active Problem List   Diagnosis Date Noted  . Atrophy of muscle of right hand 04/04/2017  . Protein-calorie malnutrition, severe 01/19/2017  . Altered mental status 01/09/2017  . Facial droop 01/09/2017  . Cerebral thrombosis with cerebral infarction 01/09/2017  . Seizures ([place])   . Other hyperlipidemia   . OSA (obstructive sleep apnea) 09/12/2016  . Weight loss 09/12/2016  . Malnutrition of moderate degree 09/11/2016  . Syncope 09/10/2016  . H/O agent Orange exposure 12/24/2015  . Type 2 diabetes with nephropathy ([place])   . Near syncope 12/22/2015  . History of TIAs 09/14/2015  . CKD stage 3 due to type 2 diabetes mellitus ([place]) 09/14/2015  . Acute lower GI bleeding 10/30/2014  . History of colonic polyps 10/30/2014  . Depression 10/30/2014  . Symptomatic anemia 10/30/2014  . AKI (acute kidney injury) ([place]) 10/30/2014  . Ataxic gait 09/21/2014  . History of CVA (cerebrovascular accident) 02/22/2013  . Abnormal brain scan 02/21/2013  . Dizziness 02/21/2013  . Weakness 02/21/2013  . Diabetes mellitus ([place]) 02/21/2013  . Hypertension 02/21/2013    Cameron Sprang, PT, MPT [facility] [address]  [place], [place], [address] Phone: [phone]   Fax:  [phone] 09/20/17, 7:25 PM  Name: Cody Silva MRN: [id number] Date of Birth: [birth year]

## 2017-09-24 ENCOUNTER — Encounter: Payer: Self-pay | Admitting: Rehabilitation

## 2017-09-24 ENCOUNTER — Ambulatory Visit: Payer: Medicare Other | Admitting: Rehabilitation

## 2017-09-24 VITALS — BP 166/92

## 2017-09-24 DIAGNOSIS — R2689 Other abnormalities of gait and mobility: Secondary | ICD-10-CM | POA: Diagnosis not present

## 2017-09-24 DIAGNOSIS — R2681 Unsteadiness on feet: Secondary | ICD-10-CM

## 2017-09-24 DIAGNOSIS — M6281 Muscle weakness (generalized): Secondary | ICD-10-CM

## 2017-09-24 NOTE — Patient Instructions (Signed)
Sit to Stand: Head Upright   With head upright, stand up slowly with eyes open.  Make sure you have chair against the wall, stand up as tall as you can and use your arms as little as possible.  Repeat __10__ times per session. Do _2___ sessions per day.  Copyright  VHI. All rights reserved.   Standing Hip Extension    Stand at chair or counter top for support.  Bring leg back as far as possible. Repeat with other leg.  Stand tall.  Repeat _10___ times. Do _2___ sessions per day.  http://gt2.exer.us/395   Copyright  VHI. All rights reserved.   HIP: Abduction - Standing    Squeeze glutes. Raise leg out and slightly back. _10__ reps per set, _2__ sets per day, _5__ days per week Hold onto a support like the counter top.   Copyright  VHI. All rights reserved.    Mini-Squats (Standing)     Stand with support. Bend knees slightly. Tighten pelvic floor. Hold for _5__ seconds. Return to straight standing. Relax for _5__ seconds. Repeat 10___ times. Do __2_ times a day.  Copyright  VHI. All rights reserved.      Feet Together, Arm Motion - Eyes Closed    With eyes closed and feet together, keep arms by your side.  Repeat _3_ times per session for 20 secs each. Do __2__ sessions per day.  Copyright  VHI. All rights reserved.   Feet Together (Compliant Surface) Head Motion - Eyes Open    With eyes open, standing on compliant surface: ___pillow_____, feet together, move head slowly: up and down x 10 reps, and side to side x 10 reps.    Do _2___ sessions per day.  Copyright  VHI. All rights reserved.

## 2017-09-24 NOTE — Therapy (Signed)
[facility] [address] [place], [place], [address] Phone: [phone]   Fax:  [phone]  Physical Therapy Treatment  Patient Details  Name: Cody Silva MRN: [id number] Date of Birth: 06/06/1946 Referring Provider: Krista Blue   Encounter Date: 09/24/2017  PT End of Session - 09/24/17 1327    Visit Number  4    Number of Visits  13    Date for PT Re-Evaluation  10/23/17    Authorization Type  UHC Medicare, Tricare, NO PTA    PT Start Time  1321    PT Stop Time  1400    PT Time Calculation (min)  39 min    Activity Tolerance  Patient tolerated treatment well    Behavior During Therapy  WFL for tasks assessed/performed       Past Medical History:  Diagnosis Date  . Anemia   . Arthritis    "right arm, right ankle, right side" (10/30/2014)  . Colon polyps   . Diabetic neuropathy ([place])   . Diabetic retinopathy ([place])   . H/O agent Orange exposure   . Headache    "@ least 3 times/wk" (10/30/2014)  . History of blood transfusion 10/30/2014   hematochezia  . History of gout   . Hypertension   . OSA on CPAP    "suppose to wear mask; I've got a call in for an equipment change" (10/30/2014)  . PTSD (post-traumatic stress disorder)    "service related"  . Seizures ([place])   . Stroke [facility]) 2014   left extremity deficits; facial left  . Type II diabetes mellitus ([place])     Past Surgical History:  Procedure Laterality Date  . BONE GRAFT HIP ILIAC CREST Left ~ 1976  . CATARACT EXTRACTION W/ INTRAOCULAR LENS  IMPLANT, BILATERAL Bilateral 2014-2015  . EP IMPLANTABLE DEVICE N/A 12/24/2015   Procedure: Loop Recorder Insertion;  Surgeon: Thompson Grayer, MD;  Location: [facility];  Service: Cardiovascular;  Laterality: N/A;  . TUMOR REMOVAL Right ~ 1976   "arm; had to take bone left hip to add to the repair"    Vitals:   09/24/17 1953  BP: (!) 166/92    Subjective Assessment - 09/24/17 1325    Subjective   Reports feeling weak today, didn't sleep very long.  No falls or changes since last visit.     Patient Stated Goals  Pt's goals for physical therapy are to improve balance.    Currently in Pain?  No/denies          TE:  Went over current HEP for BLE strengthening.  See pt instruction for details on exercises and reps performed.    NMR:  Initiated corner balance tasks to add to HEP.  Performed feet apart EC solid ground x 30 secs>feet together EO solid ground x 30 secs>feet together EO with head turns side/side and up/down x 10 reps each>feet together EC x 2 sets of 30 secs>feet apart EO compliant surface x 20 secs>feet apart EO w/ head turns up/down and side/side x 10 reps>feet together EO compliant surface x 30 secs>feet apart EC compliant surface x 30 secs>feet together EC compliant surface x 2 sets of 30 secs.  See additions to HEP based on difficulty with these exercises.             [facility] Adult PT Treatment/Exercise - 09/24/17 0001      Self-Care   Self-Care  Other Self-Care Comments    Other Self-Care Comments  Pt states he has not yet called to notify [facility] of BPs in the last week +.  PT recommend he call them to give BP recordings, esp the elevated ones from previous therapy session.  Pt verbalized understanding.  Educated on importance of getting MD advice esp since he is having symptoms with BP issues.              PT Education - 09/24/17 1327    Education provided  Yes    Education Details  see self care    Person(s) Educated  Patient    Methods  Explanation    Comprehension  Verbalized understanding       PT Short Term Goals - 09/20/17 1410      PT SHORT TERM GOAL #1   Title  Pt will perform HEP with family supervision, for strength, balance and gait.  TARGET 09/21/17    Baseline  initiated 09/20/17    Time  4    Period  Weeks    Status  Not Met      PT SHORT TERM GOAL #2   Title  Pt will improve TUG score to less than or equal to 15 seconds for  decreased fall risk.      Baseline  16.12 secs with SPC on 09/20/17    Time  4    Period  Weeks    Status  Not Met      PT SHORT TERM GOAL #3   Title  Pt will improve Berg Balance score to at least 41/56 for decreased fall risk.    Baseline  38/56 on 09/20/17    Time  4    Period  Weeks    Status  Not Met      PT SHORT TERM GOAL #4   Title  Pt will perform at least 6 of 10 reps of sit<>stand transfers with minimal to no UE support for imrpoved funcitonal lower extremity stregnthening and transfers.    Baseline  5TSS 24.22 secs with no UE support x 2 reps, but needing both hands for remaining reps.     Time  4    Period  Weeks    Status  Not Met        PT Long Term Goals - 08/24/17 1707      PT LONG TERM GOAL #1   Title  Pt will verbalize understanding of fall prevention in the home environment.  TARGET 10/05/17    Time  6    Period  Weeks    Status  New    Target Date  10/05/17      PT LONG TERM GOAL #2   Title  Pt will improve TUG score to less than or equal to 13.5 seconds for decreased fall risk.      Time  6    Period  Weeks    Status  New    Target Date  10/05/17      PT LONG TERM GOAL #3   Title  Pt will improve Berg BAlance score to at least 48/56 for decreased fall risk.    Time  6    Period  Weeks    Target Date  10/05/17      PT LONG TERM GOAL #4   Title  Pt will improve gait velocity to at least 2 ft/sec for improved gait efficiency and safety.    Time  6    Period  Weeks    Status  New  Target Date  10/05/17      PT LONG TERM GOAL #5   Title  Pt will ambulate at least 500 ft, indoor and outdoor surfaces, modified independenlty and no LOB, for improved community gait.    Time  6    Period  Weeks    Status  New    Target Date  10/05/17            Plan - 09/24/17 1956    Clinical Impression Statement  Skilled session went over current HEP with cues for correct technique and posture as well as adding corner balance tasks.  See pt instruction  for details.      PT Treatment/Interventions  ADLs/Self Care Home Management;DME Instruction;Gait training;Stair training;Functional mobility training;Therapeutic activities;Therapeutic exercise;Balance training;Neuromuscular re-education;Patient/family education    PT Next Visit Plan  Check compliance with HEP, add balance (consider OTAGO); walking program for home; monitor BP!    Consulted and Agree with Plan of Care  Patient       Patient will benefit from skilled therapeutic intervention in order to improve the following deficits and impairments:  Abnormal gait, Decreased balance, Decreased endurance, Decreased mobility, Difficulty walking, Decreased strength, Postural dysfunction  Visit Diagnosis: Other abnormalities of gait and mobility  Muscle weakness (generalized)  Unsteadiness on feet     Problem List Patient Active Problem List   Diagnosis Date Noted  . Atrophy of muscle of right hand 04/04/2017  . Protein-calorie malnutrition, severe 01/19/2017  . Altered mental status 01/09/2017  . Facial droop 01/09/2017  . Cerebral thrombosis with cerebral infarction 01/09/2017  . Seizures ([place])   . Other hyperlipidemia   . OSA (obstructive sleep apnea) 09/12/2016  . Weight loss 09/12/2016  . Malnutrition of moderate degree 09/11/2016  . Syncope 09/10/2016  . H/O agent Orange exposure 12/24/2015  . Type 2 diabetes with nephropathy ([place])   . Near syncope 12/22/2015  . History of TIAs 09/14/2015  . CKD stage 3 due to type 2 diabetes mellitus ([place]) 09/14/2015  . Acute lower GI bleeding 10/30/2014  . History of colonic polyps 10/30/2014  . Depression 10/30/2014  . Symptomatic anemia 10/30/2014  . AKI (acute kidney injury) ([place]) 10/30/2014  . Ataxic gait 09/21/2014  . History of CVA (cerebrovascular accident) 02/22/2013  . Abnormal brain scan 02/21/2013  . Dizziness 02/21/2013  . Weakness 02/21/2013  . Diabetes mellitus ([place]) 02/21/2013  . Hypertension 02/21/2013    Cameron Sprang, PT, MPT [facility] [address] [place], [place], [address] Phone: [phone]   Fax:  [phone] 09/24/17, 8:03 PM  Name: Cody Silva MRN: [id number] Date of Birth: 1945-12-20

## 2017-09-25 LAB — CUP PACEART REMOTE DEVICE CHECK
Date Time Interrogation Session: 20190111193950
MDC IDC PG IMPLANT DT: 20170421

## 2017-09-27 ENCOUNTER — Ambulatory Visit: Payer: Medicare Other | Admitting: Rehabilitation

## 2017-09-27 ENCOUNTER — Encounter: Payer: Self-pay | Admitting: Rehabilitation

## 2017-09-27 DIAGNOSIS — M6281 Muscle weakness (generalized): Secondary | ICD-10-CM

## 2017-09-27 DIAGNOSIS — R2681 Unsteadiness on feet: Secondary | ICD-10-CM

## 2017-09-27 DIAGNOSIS — R2689 Other abnormalities of gait and mobility: Secondary | ICD-10-CM | POA: Diagnosis not present

## 2017-09-27 NOTE — Therapy (Signed)
[facility] [address] [place], [place], [address] Phone: [phone]   Fax:  [phone]  Physical Therapy Treatment  Patient Details  Name: Cody Silva MRN: [id number] Date of Birth: August 07, 1946 Referring Provider: Krista Blue   Encounter Date: 09/27/2017  PT End of Session - 09/27/17 1635    Visit Number  5    Number of Visits  13    Date for PT Re-Evaluation  10/23/17    Authorization Type  UHC Medicare, Tricare, NO PTA    PT Start Time  1317    PT Stop Time  1400    PT Time Calculation (min)  43 min    Activity Tolerance  Patient tolerated treatment well    Behavior During Therapy  WFL for tasks assessed/performed       Past Medical History:  Diagnosis Date  . Anemia   . Arthritis    "right arm, right ankle, right side" (10/30/2014)  . Colon polyps   . Diabetic neuropathy ([place])   . Diabetic retinopathy ([place])   . H/O agent Orange exposure   . Headache    "@ least 3 times/wk" (10/30/2014)  . History of blood transfusion 10/30/2014   hematochezia  . History of gout   . Hypertension   . OSA on CPAP    "suppose to wear mask; I've got a call in for an equipment change" (10/30/2014)  . PTSD (post-traumatic stress disorder)    "service related"  . Seizures ([place])   . Stroke [facility]) 2014   left extremity deficits; facial left  . Type II diabetes mellitus ([place])     Past Surgical History:  Procedure Laterality Date  . BONE GRAFT HIP ILIAC CREST Left ~ 1976  . CATARACT EXTRACTION W/ INTRAOCULAR LENS  IMPLANT, BILATERAL Bilateral 2014-2015  . EP IMPLANTABLE DEVICE N/A 12/24/2015   Procedure: Loop Recorder Insertion;  Surgeon: Thompson Grayer, MD;  Location: [facility];  Service: Cardiovascular;  Laterality: N/A;  . TUMOR REMOVAL Right ~ 1976   "arm; had to take bone left hip to add to the repair"    There were no vitals filed for this visit.  Subjective Assessment - 09/27/17 1320    Subjective  No  changes, no falls.     Patient Stated Goals  Pt's goals for physical therapy are to improve balance.    Currently in Pain?  No/denies                      [facility] Adult PT Treatment/Exercise - 09/27/17 0001      Ambulation/Gait   Ambulation/Gait  Yes    Ambulation/Gait Assistance  5: Supervision;4: Min assist Min A to facilitate faster gait speed.     Ambulation/Gait Assistance Details  Had pt ambulate x 3 laps during session with use of SPC with emphasis on upright posture, increased stride length, and increased gait speed.  Pt needing constant cues as well as manual facilitation for increased gait speed.  Educated on purpose of increasing gait speed.      Ambulation Distance (Feet)  345 Feet    Assistive device  Straight cane    Gait Pattern  Step-through pattern;Decreased step length - right;Decreased dorsiflexion - right;Narrow base of support;Poor foot clearance - right    Ambulation Surface  Level;Indoor      Neuro Re-ed    Neuro Re-ed Details   High level balance at counter top: Marchig forwards/backwards x 2 reps down and  back, walking tandem forwards and backwards x 2 reps each, tapping cones x 8 reps alternating LEs for modified SLS progressing to tipping cone over and back upright to increase time spent in SLS.  Pt with increased difficulty when in stance on RLE vs LLE.  Worked on increasing stride and step length with stepping sideways and forwards over cones x 2 reps each down and back along counter top.  Intermittent light UE support as needed. Stepping to cones as target on the floor both laterally and anteriorly with emphasis on taking single step only.  Pt with increased difficulty when returning LE to start position.               PT Education - 09/27/17 1321    Education provided  Yes    Education Details  purpose of increasing gait speed and stride length.    Person(s) Educated  Patient    Methods  Explanation    Comprehension  Verbalized understanding        PT Short Term Goals - 09/20/17 1410      PT SHORT TERM GOAL #1   Title  Pt will perform HEP with family supervision, for strength, balance and gait.  TARGET 09/21/17    Baseline  initiated 09/20/17    Time  4    Period  Weeks    Status  Not Met      PT SHORT TERM GOAL #2   Title  Pt will improve TUG score to less than or equal to 15 seconds for decreased fall risk.      Baseline  16.12 secs with SPC on 09/20/17    Time  4    Period  Weeks    Status  Not Met      PT SHORT TERM GOAL #3   Title  Pt will improve Berg Balance score to at least 41/56 for decreased fall risk.    Baseline  38/56 on 09/20/17    Time  4    Period  Weeks    Status  Not Met      PT SHORT TERM GOAL #4   Title  Pt will perform at least 6 of 10 reps of sit<>stand transfers with minimal to no UE support for imrpoved funcitonal lower extremity stregnthening and transfers.    Baseline  5TSS 24.22 secs with no UE support x 2 reps, but needing both hands for remaining reps.     Time  4    Period  Weeks    Status  Not Met        PT Long Term Goals - 08/24/17 1707      PT LONG TERM GOAL #1   Title  Pt will verbalize understanding of fall prevention in the home environment.  TARGET 10/05/17    Time  6    Period  Weeks    Status  New    Target Date  10/05/17      PT LONG TERM GOAL #2   Title  Pt will improve TUG score to less than or equal to 13.5 seconds for decreased fall risk.      Time  6    Period  Weeks    Status  New    Target Date  10/05/17      PT LONG TERM GOAL #3   Title  Pt will improve Berg BAlance score to at least 48/56 for decreased fall risk.    Time  6    Period  Weeks    Target Date  10/05/17      PT LONG TERM GOAL #4   Title  Pt will improve gait velocity to at least 2 ft/sec for improved gait efficiency and safety.    Time  6    Period  Weeks    Status  New    Target Date  10/05/17      PT LONG TERM GOAL #5   Title  Pt will ambulate at least 500 ft, indoor and outdoor  surfaces, modified independenlty and no LOB, for improved community gait.    Time  6    Period  Weeks    Status  New    Target Date  10/05/17            Plan - 09/27/17 1635    Clinical Impression Statement  Session focused on high level balance at counter top and without UE support with modified SLS as well as hip strengthening.  Also worked on exercises to increase stride length with gait.  Pt tolerated well however needs continuous cuing for increased gait speed.      PT Treatment/Interventions  ADLs/Self Care Home Management;DME Instruction;Gait training;Stair training;Functional mobility training;Therapeutic activities;Therapeutic exercise;Balance training;Neuromuscular re-education;Patient/family education    PT Next Visit Plan  Check compliance with HEP and add/modify as needed; walking program for home; monitor BP! High level balance, improved posture with mobility, increasing gait speed and stride length    Consulted and Agree with Plan of Care  Patient       Patient will benefit from skilled therapeutic intervention in order to improve the following deficits and impairments:  Abnormal gait, Decreased balance, Decreased endurance, Decreased mobility, Difficulty walking, Decreased strength, Postural dysfunction  Visit Diagnosis: Other abnormalities of gait and mobility  Muscle weakness (generalized)  Unsteadiness on feet     Problem List Patient Active Problem List   Diagnosis Date Noted  . Atrophy of muscle of right hand 04/04/2017  . Protein-calorie malnutrition, severe 01/19/2017  . Altered mental status 01/09/2017  . Facial droop 01/09/2017  . Cerebral thrombosis with cerebral infarction 01/09/2017  . Seizures ([place])   . Other hyperlipidemia   . OSA (obstructive sleep apnea) 09/12/2016  . Weight loss 09/12/2016  . Malnutrition of moderate degree 09/11/2016  . Syncope 09/10/2016  . H/O agent Orange exposure 12/24/2015  . Type 2 diabetes with nephropathy ([place])    . Near syncope 12/22/2015  . History of TIAs 09/14/2015  . CKD stage 3 due to type 2 diabetes mellitus ([place]) 09/14/2015  . Acute lower GI bleeding 10/30/2014  . History of colonic polyps 10/30/2014  . Depression 10/30/2014  . Symptomatic anemia 10/30/2014  . AKI (acute kidney injury) ([place]) 10/30/2014  . Ataxic gait 09/21/2014  . History of CVA (cerebrovascular accident) 02/22/2013  . Abnormal brain scan 02/21/2013  . Dizziness 02/21/2013  . Weakness 02/21/2013  . Diabetes mellitus ([place]) 02/21/2013  . Hypertension 02/21/2013    Cameron Sprang, PT, MPT [facility] [address] [place], [place], [address] Phone: [phone]   Fax:  [phone] 09/27/17, 4:39 PM  Name: Cody Silva MRN: [id number] Date of Birth: [birth year]

## 2017-10-02 ENCOUNTER — Ambulatory Visit: Payer: Medicare Other | Admitting: Physical Therapy

## 2017-10-02 ENCOUNTER — Encounter: Payer: Self-pay | Admitting: Physical Therapy

## 2017-10-02 VITALS — BP 166/104

## 2017-10-02 DIAGNOSIS — M6281 Muscle weakness (generalized): Secondary | ICD-10-CM

## 2017-10-02 DIAGNOSIS — R2681 Unsteadiness on feet: Secondary | ICD-10-CM

## 2017-10-02 DIAGNOSIS — R2689 Other abnormalities of gait and mobility: Secondary | ICD-10-CM

## 2017-10-02 NOTE — Patient Instructions (Addendum)
Fall Prevention in the Home Falls can cause injuries and can affect people from all age groups. There are many simple things that you can do to make your home safe and to help prevent falls. What can I do on the outside of my home?  Regularly repair the edges of walkways and driveways and fix any cracks.  Remove high doorway thresholds.  Trim any shrubbery on the main path into your home.  Use bright outdoor lighting.  Clear walkways of debris and clutter, including tools and rocks.  Regularly check that handrails are securely fastened and in good repair. Both sides of any steps should have handrails.  Install guardrails along the edges of any raised decks or porches.  Have leaves, snow, and ice cleared regularly.  Use sand or salt on walkways during winter months.  In the garage, clean up any spills right away, including grease or oil spills. What can I do in the bathroom?  Use night lights.  Install grab bars by the toilet and in the tub and shower. Do not use towel bars as grab bars.  Use non-skid mats or decals on the floor of the tub or shower.  If you need to sit down while you are in the shower, use a plastic, non-slip stool.  Keep the floor dry. Immediately clean up any water that spills on the floor.  Remove soap buildup in the tub or shower on a regular basis.  Attach bath mats securely with double-sided non-slip rug tape.  Remove throw rugs and other tripping hazards from the floor. What can I do in the bedroom?  Use night lights.  Make sure that a bedside light is easy to reach.  Do not use oversized bedding that drapes onto the floor.  Have a firm chair that has side arms to use for getting dressed.  Remove throw rugs and other tripping hazards from the floor. What can I do in the kitchen?  Clean up any spills right away.  Avoid walking on wet floors.  Place frequently used items in easy-to-reach places.  If you need to reach for something above  you, use a sturdy step stool that has a grab bar.  Keep electrical cables out of the way.  Do not use floor polish or wax that makes floors slippery. If you have to use wax, make sure that it is non-skid floor wax.  Remove throw rugs and other tripping hazards from the floor. What can I do in the stairways?  Do not leave any items on the stairs.  Make sure that there are handrails on both sides of the stairs. Fix handrails that are broken or loose. Make sure that handrails are as long as the stairways.  Check any carpeting to make sure that it is firmly attached to the stairs. Fix any carpet that is loose or worn.  Avoid having throw rugs at the top or bottom of stairways, or secure the rugs with carpet tape to prevent them from moving.  Make sure that you have a light switch at the top of the stairs and the bottom of the stairs. If you do not have them, have them installed. What are some other fall prevention tips?  Wear closed-toe shoes that fit well and support your feet. Wear shoes that have rubber soles or low heels.  When you use a stepladder, make sure that it is completely opened and that the sides are firmly locked. Have someone hold the ladder while you are using   it. Do not climb a closed stepladder.  Add color or contrast paint or tape to grab bars and handrails in your home. Place contrasting color strips on the first and last steps.  Use mobility aids as needed, such as canes, walkers, scooters, and crutches.  Turn on lights if it is dark. Replace any light bulbs that burn out.  Set up furniture so that there are clear paths. Keep the furniture in the same spot.  Fix any uneven floor surfaces.  Choose a carpet design that does not hide the edge of steps of a stairway.  Be aware of any and all pets.  Review your medicines with your healthcare provider. Some medicines can cause dizziness or changes in blood pressure, which increase your risk of falling. Talk with  your health care provider about other ways that you can decrease your risk of falls. This may include working with a physical therapist or trainer to improve your strength, balance, and endurance. This information is not intended to replace advice given to you by your health care provider. Make sure you discuss any questions you have with your health care provider. Document Released: 08/11/2002 Document Revised: 01/18/2016 Document Reviewed: 09/25/2014 Elsevier Interactive Patient Education  2018 Elsevier Inc.  

## 2017-10-02 NOTE — Therapy (Signed)
[facility] [address] [place], [place], [address] Phone: [phone]   Fax:  [phone]  Physical Therapy Treatment  Patient Details  Name: Cody Silva MRN: [id number] Date of Birth: [birth year] Referring Provider: Krista Blue   Encounter Date: 10/02/2017  PT End of Session - 10/02/17 1420    Visit Number  6    Number of Visits  13    Date for PT Re-Evaluation  10/23/17    Authorization Type  UHC Medicare, Tricare, NO PTA    PT Start Time  1318    PT Stop Time  1404    PT Time Calculation (min)  46 min    Activity Tolerance  Patient tolerated treatment well    Behavior During Therapy  WFL for tasks assessed/performed       Past Medical History:  Diagnosis Date  . Anemia   . Arthritis    "right arm, right ankle, right side" (10/30/2014)  . Colon polyps   . Diabetic neuropathy ([place])   . Diabetic retinopathy ([place])   . H/O agent Orange exposure   . Headache    "@ least 3 times/wk" (10/30/2014)  . History of blood transfusion 10/30/2014   hematochezia  . History of gout   . Hypertension   . OSA on CPAP    "suppose to wear mask; I've got a call in for an equipment change" (10/30/2014)  . PTSD (post-traumatic stress disorder)    "service related"  . Seizures ([place])   . Stroke [facility]) 2014   left extremity deficits; facial left  . Type II diabetes mellitus ([place])     Past Surgical History:  Procedure Laterality Date  . BONE GRAFT HIP ILIAC CREST Left ~ 1976  . CATARACT EXTRACTION W/ INTRAOCULAR LENS  IMPLANT, BILATERAL Bilateral 2014-2015  . EP IMPLANTABLE DEVICE N/A 12/24/2015   Procedure: Loop Recorder Insertion;  Surgeon: Thompson Grayer, MD;  Location: [facility];  Service: Cardiovascular;  Laterality: N/A;  . TUMOR REMOVAL Right ~ 1976   "arm; had to take bone left hip to add to the repair"    Vitals:   10/02/17 1322 10/02/17 1330 10/02/17 1350 10/02/17 1400  BP: (!) 180/104 (!) 178/104 (!) 144/98  (!) 166/104    Subjective Assessment - 10/02/17 1315    Subjective  No falls or near-falls. Maybe doing exercises every other day.     Patient Stated Goals  Pt's goals for physical therapy are to improve balance.    Currently in Pain?  No/denies                      [facility] Adult PT Treatment/Exercise - 10/02/17 0001      Self-Care   Self-Care  Other Self-Care Comments    Other Self-Care Comments   Educated on fall prevention tips for home. Patient does not have a railing for outside steps, but reports his son (or other family member) is always with him and uses his cane. Discussed safety with bathroom rugs and doormats.           Balance Exercises - 10/02/17 1417      Balance Exercises: Standing   Standing Eyes Opened  Narrow base of support (BOS);Wide (BOA);Head turns;Foam/compliant surface;Solid surface great difficulty on blue airex, feet apart     Standing Eyes Closed  Narrow base of support (BOS);Solid surface;Head turns        PT Education - 10/02/17 1419    Education provided  Yes    Education Details  need to monitor BP daily at home (has a machine, doesn't always check it), need to discuss with MD what his goal for BP is (pt unsure and educated general goal for public is <333/54); role of balance exercises and need for repetition (like learning to shoot free throws--takes lots of practice)    Person(s) Educated  Patient    Methods  Explanation    Comprehension  Verbalized understanding       PT Short Term Goals - 09/20/17 1410      PT SHORT TERM GOAL #1   Title  Pt will perform HEP with family supervision, for strength, balance and gait.  TARGET 09/21/17    Baseline  initiated 09/20/17    Time  4    Period  Weeks    Status  Not Met      PT SHORT TERM GOAL #2   Title  Pt will improve TUG score to less than or equal to 15 seconds for decreased fall risk.      Baseline  16.12 secs with SPC on 09/20/17    Time  4    Period  Weeks    Status  Not Met       PT SHORT TERM GOAL #3   Title  Pt will improve Berg Balance score to at least 41/56 for decreased fall risk.    Baseline  38/56 on 09/20/17    Time  4    Period  Weeks    Status  Not Met      PT SHORT TERM GOAL #4   Title  Pt will perform at least 6 of 10 reps of sit<>stand transfers with minimal to no UE support for imrpoved funcitonal lower extremity stregnthening and transfers.    Baseline  5TSS 24.22 secs with no UE support x 2 reps, but needing both hands for remaining reps.     Time  4    Period  Weeks    Status  Not Met        PT Long Term Goals - 08/24/17 1707      PT LONG TERM GOAL #1   Title  Pt will verbalize understanding of fall prevention in the home environment.  TARGET 10/05/17    Time  6    Period  Weeks    Status  New    Target Date  10/05/17      PT LONG TERM GOAL #2   Title  Pt will improve TUG score to less than or equal to 13.5 seconds for decreased fall risk.      Time  6    Period  Weeks    Status  New    Target Date  10/05/17      PT LONG TERM GOAL #3   Title  Pt will improve Berg BAlance score to at least 48/56 for decreased fall risk.    Time  6    Period  Weeks    Target Date  10/05/17      PT LONG TERM GOAL #4   Title  Pt will improve gait velocity to at least 2 ft/sec for improved gait efficiency and safety.    Time  6    Period  Weeks    Status  New    Target Date  10/05/17      PT LONG TERM GOAL #5   Title  Pt will ambulate at least 500 ft, indoor and outdoor surfaces,  modified independenlty and no LOB, for improved community gait.    Time  6    Period  Weeks    Status  New    Target Date  10/05/17            Plan - 10/02/17 1427    Clinical Impression Statement  Session limited by pt's elevated BP at beginning of session. Focused on patient education initially with BP coming down enough to work on corner standing balance exercises, however pt began to feel light-headed and noted his SBP dropped 30 mmHg. Patient assisted  to sit, provided with water to drink and BP began to recover. Closely monitored pt to walk to front of office.     Rehab Potential  Good    PT Frequency  2x / week    PT Duration  6 weeks    PT Treatment/Interventions  ADLs/Self Care Home Management;DME Instruction;Gait training;Stair training;Functional mobility training;Therapeutic activities;Therapeutic exercise;Balance training;Neuromuscular re-education;Patient/family education    PT Next Visit Plan  Check BP (elevated, but also became orthostatic last session); check LTGs and decide if re-certify/schedule more appts; Check compliance with HEP and add/modify as needed; walking program for home; monitor BP! High level balance, improved posture with mobility, increasing gait speed and stride length    Consulted and Agree with Plan of Care  Patient       Patient will benefit from skilled therapeutic intervention in order to improve the following deficits and impairments:  Abnormal gait, Decreased balance, Decreased endurance, Decreased mobility, Difficulty walking, Decreased strength, Postural dysfunction  Visit Diagnosis: Other abnormalities of gait and mobility  Muscle weakness (generalized)  Unsteadiness on feet     Problem List Patient Active Problem List   Diagnosis Date Noted  . Atrophy of muscle of right hand 04/04/2017  . Protein-calorie malnutrition, severe 01/19/2017  . Altered mental status 01/09/2017  . Facial droop 01/09/2017  . Cerebral thrombosis with cerebral infarction 01/09/2017  . Seizures ([place])   . Other hyperlipidemia   . OSA (obstructive sleep apnea) 09/12/2016  . Weight loss 09/12/2016  . Malnutrition of moderate degree 09/11/2016  . Syncope 09/10/2016  . H/O agent Orange exposure 12/24/2015  . Type 2 diabetes with nephropathy ([place])   . Near syncope 12/22/2015  . History of TIAs 09/14/2015  . CKD stage 3 due to type 2 diabetes mellitus ([place]) 09/14/2015  . Acute lower GI bleeding 10/30/2014  . History  of colonic polyps 10/30/2014  . Depression 10/30/2014  . Symptomatic anemia 10/30/2014  . AKI (acute kidney injury) ([place]) 10/30/2014  . Ataxic gait 09/21/2014  . History of CVA (cerebrovascular accident) 02/22/2013  . Abnormal brain scan 02/21/2013  . Dizziness 02/21/2013  . Weakness 02/21/2013  . Diabetes mellitus ([place]) 02/21/2013  . Hypertension 02/21/2013    Rexanne Mano, PT 10/02/2017, 2:32 PM  [facility] [address] [place], [place], [address] Phone: [phone]   Fax:  [phone]  Name: Cody Silva MRN: [id number] Date of Birth: 04/17/1946

## 2017-10-04 ENCOUNTER — Ambulatory Visit: Payer: Medicare Other | Admitting: Rehabilitation

## 2017-10-09 ENCOUNTER — Emergency Department (HOSPITAL_COMMUNITY): Payer: Medicare Other

## 2017-10-09 ENCOUNTER — Emergency Department (HOSPITAL_COMMUNITY)
Admission: EM | Admit: 2017-10-09 | Discharge: 2017-10-09 | Disposition: A | Discharge status: Home / Self Care | Payer: Medicare Other | Attending: Emergency Medicine | Admitting: Emergency Medicine

## 2017-10-09 ENCOUNTER — Encounter (HOSPITAL_COMMUNITY): Payer: Self-pay | Admitting: Emergency Medicine

## 2017-10-09 DIAGNOSIS — Y93E1 Activity, personal bathing and showering: Secondary | ICD-10-CM | POA: Insufficient documentation

## 2017-10-09 DIAGNOSIS — R42 Dizziness and giddiness: Secondary | ICD-10-CM | POA: Diagnosis present

## 2017-10-09 DIAGNOSIS — Y92002 Bathroom of unspecified non-institutional (private) residence single-family (private) house as the place of occurrence of the external cause: Secondary | ICD-10-CM | POA: Diagnosis not present

## 2017-10-09 DIAGNOSIS — R55 Syncope and collapse: Secondary | ICD-10-CM | POA: Diagnosis not present

## 2017-10-09 DIAGNOSIS — Z7902 Long term (current) use of antithrombotics/antiplatelets: Secondary | ICD-10-CM | POA: Diagnosis not present

## 2017-10-09 DIAGNOSIS — N183 Chronic kidney disease, stage 3 (moderate): Secondary | ICD-10-CM | POA: Diagnosis not present

## 2017-10-09 DIAGNOSIS — I129 Hypertensive chronic kidney disease with stage 1 through stage 4 chronic kidney disease, or unspecified chronic kidney disease: Secondary | ICD-10-CM | POA: Diagnosis not present

## 2017-10-09 DIAGNOSIS — S8991XA Unspecified injury of right lower leg, initial encounter: Secondary | ICD-10-CM

## 2017-10-09 DIAGNOSIS — W182XXA Fall in (into) shower or empty bathtub, initial encounter: Secondary | ICD-10-CM | POA: Diagnosis not present

## 2017-10-09 DIAGNOSIS — Y998 Other external cause status: Secondary | ICD-10-CM | POA: Insufficient documentation

## 2017-10-09 DIAGNOSIS — Z8673 Personal history of transient ischemic attack (TIA), and cerebral infarction without residual deficits: Secondary | ICD-10-CM | POA: Diagnosis not present

## 2017-10-09 DIAGNOSIS — E1140 Type 2 diabetes mellitus with diabetic neuropathy, unspecified: Secondary | ICD-10-CM | POA: Diagnosis not present

## 2017-10-09 DIAGNOSIS — Z79899 Other long term (current) drug therapy: Secondary | ICD-10-CM | POA: Insufficient documentation

## 2017-10-09 DIAGNOSIS — Z87898 Personal history of other specified conditions: Secondary | ICD-10-CM

## 2017-10-09 LAB — URINALYSIS, ROUTINE W REFLEX MICROSCOPIC
BILIRUBIN URINE: NEGATIVE
Glucose, UA: NEGATIVE mg/dL
KETONES UR: NEGATIVE mg/dL
Nitrite: NEGATIVE
Protein, ur: >=300 mg/dL — AB
Specific Gravity, Urine: 1.02 (ref 1.005–1.030)
pH: 5 (ref 5.0–8.0)

## 2017-10-09 LAB — BASIC METABOLIC PANEL
Anion gap: 7 (ref 5–15)
BUN: 25 mg/dL — ABNORMAL HIGH (ref 6–20)
CHLORIDE: 103 mmol/L (ref 101–111)
CO2: 28 mmol/L (ref 22–32)
CREATININE: 2.13 mg/dL — AB (ref 0.61–1.24)
Calcium: 9.4 mg/dL (ref 8.9–10.3)
GFR calc non Af Amer: 30 mL/min — ABNORMAL LOW (ref >60)
GFR, EST AFRICAN AMERICAN: 34 mL/min — AB (ref >60)
GLUCOSE: 149 mg/dL — AB (ref 65–99)
Potassium: 3.5 mmol/L (ref 3.5–5.1)
Sodium: 138 mmol/L (ref 135–145)

## 2017-10-09 LAB — CBC
HCT: 34.9 % — ABNORMAL LOW (ref 39.0–52.0)
HEMOGLOBIN: 11.9 g/dL — AB (ref 13.0–17.0)
MCH: 29.8 pg (ref 26.0–34.0)
MCHC: 34.1 g/dL (ref 30.0–36.0)
MCV: 87.3 fL (ref 78.0–100.0)
PLATELETS: 154 10*3/uL (ref 150–400)
RBC: 4 MIL/uL — AB (ref 4.22–5.81)
RDW: 12.6 % (ref 11.5–15.5)
WBC: 4.4 10*3/uL (ref 4.0–10.5)

## 2017-10-09 LAB — CBG MONITORING, ED: GLUCOSE-CAPILLARY: 140 mg/dL — AB (ref 65–99)

## 2017-10-09 MED ORDER — OXYCODONE-ACETAMINOPHEN 5-325 MG PO TABS: 1.0000 | ORAL_TABLET | Freq: Three times a day (TID) | ORAL | 0 refills | Status: DC | PRN

## 2017-10-09 MED ORDER — DEXTROSE 5 % IV SOLN
1.0000 g | Freq: Once | INTRAVENOUS | Status: AC
  Administered 2017-10-09: 1 g via INTRAVENOUS
  Filled 2017-10-09: qty 10

## 2017-10-09 MED ORDER — LOSARTAN POTASSIUM 50 MG PO TABS
100.0000 mg | ORAL_TABLET | Freq: Every day | ORAL | Status: AC
  Administered 2017-10-09: 100 mg via ORAL
  Filled 2017-10-09: qty 2

## 2017-10-09 MED ORDER — CARVEDILOL 12.5 MG PO TABS
12.5000 mg | ORAL_TABLET | Freq: Two times a day (BID) | ORAL | Status: AC
  Administered 2017-10-09: 12.5 mg via ORAL
  Filled 2017-10-09: qty 1

## 2017-10-09 MED ORDER — OXYCODONE-ACETAMINOPHEN 5-325 MG PO TABS
1.0000 | ORAL_TABLET | Freq: Once | ORAL | Status: AC
  Administered 2017-10-09: 1 via ORAL
  Filled 2017-10-09: qty 1

## 2017-10-09 NOTE — ED Notes (Signed)
Pt transported to CT ?

## 2017-10-09 NOTE — ED Notes (Signed)
Patient transported to CT 

## 2017-10-09 NOTE — ED Provider Notes (Signed)
Medical screening examination/treatment/procedure(s) were conducted as a shared visit with non-physician practitioner(s) and myself.  I personally evaluated the patient during the encounter.   EKG Interpretation None     Patient has multiple medical comorbidities.  He fell 5 days ago in the shower after syncopal episode.  At that time, his legs were splayed out and he had pain in the right knee.  The pain and instability has been worsening over the past several days.  Patient is alert and appropriate.  No confusion.  No respiratory distress.  Right knee has mild effusion with no erythema.  Tenderness to palpation along joint lines.  No lower extremity edema.  Calf is soft and nontender.  CT has ruled out tibial plateau fracture.  At this time I agree with knee immobilizer and limited weightbearing.  Patient's urine does test grossly positive for UTI.  I agree with initiating treatment.  Agree with plan of management.   Charlesetta Shanks, MD 10/09/17 [phone]

## 2017-10-09 NOTE — ED Triage Notes (Signed)
Patient presents today c/o dizziness starting aprox between 9-10 today. Also c/o right knee pain after fall on Thursday. Pt states having a syncopal episode in shower after taking his BP meds. Pt was too scared to take meds today. Reports vision changes "for a while." denies any numbness.

## 2017-10-09 NOTE — ED Notes (Signed)
Notified MD of elevated BP

## 2017-10-09 NOTE — ED Provider Notes (Signed)
[facility] DEPT Provider Note   CSN: [id number] Arrival date & time: 10/09/17  1227     History   Chief Complaint Chief Complaint  Patient presents with  . Dizziness    HPI Cody Silva is a 72 y.o. 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  The patient reports acute onset right knee pain that began 5 days ago after having a syncopal episode in the shower.  The patient reports a history of syncopal and near syncope and is followed by his PCP at the [facility] who suspects it is secondary to his BP.  He reports the episode 5 days ago happened shortly after taking his losartan.  No recent changes with his hypertension medications.  States that the episode feels very similar to previous syncopal episodes and he got lightheaded prior to passing out.  His son reports that typically his symptoms were improve after he reclines his father and elevates his legs, which is how he treated this fall.  The patient's son reports that he heard a crash and was at his father's side within seconds.  No shaking or jerking was noted and the patient was immediately able to answer questions and did not seem confused.  His son reports that when he arrived in the bathroom that his father's legs were bent at the knees and his feet were splayed out lateral to his body.   He reports the pain is constant and worse with bearing weight and walking.  Mildly improved with nonweightbearing.  He is ambulatory with a cane at baseline, and has been ambulatory since the fall, but states that it is extremely painful and he walks much slower.  He is treated his symptoms with 1 dose of Tylenol several days ago with no improvement.[name]  He also reports mild left hip pain.  No worse than baseline numbness, right hip pain, left knee, bilateral ankle  pain.  He denies hitting his head, nausea, or emesis. Denies visual changes, HA, tinnitus, dysequilibrium, dyspnea, or CP. He reports generalized weakness that began this morning, but not other associated symptoms.   MR brain on 05/18 with remote lacunar infarct at the [place] of the right internal capsule and the left internal capsule/globus pallidus.  Right corona radiata infarct that is fading from previous MR.   The history is provided by the patient and a relative. No language interpreter was used.  Dizziness  Quality:  Lightheadedness Onset quality:  Sudden Timing:  Sporadic Progression:  Unchanged Chronicity:  Chronic Relieved by: elevating his legs. Worsened by:  Nothing Associated symptoms: weakness (generalized)   Associated symptoms: no chest pain, no diarrhea, no headaches, no nausea, no shortness of breath and no vomiting   Risk factors: hx of stroke and multiple medications   Risk factors: no anemia and no new medications     Past Medical History:  Diagnosis Date  . Anemia   . Arthritis    "right arm, right ankle, right side" (10/30/2014)  . Colon polyps   . Diabetic neuropathy ([place])   . Diabetic retinopathy ([place])   . H/O agent Orange exposure   . Headache    "@ least 3 times/wk" (10/30/2014)  . History of blood transfusion 10/30/2014   hematochezia  . History of gout   . Hypertension   . OSA on CPAP    "  suppose to wear mask; I've got a call in for an equipment change" (10/30/2014)  . PTSD (post-traumatic stress disorder)    "service related"  . Seizures ([place])   . Stroke [facility]) 2014   left extremity deficits; facial left  . Type II diabetes mellitus [facility])     Patient Active Problem List   Diagnosis Date Noted  . Atrophy of muscle of right hand 04/04/2017  . Protein-calorie malnutrition, severe 01/19/2017  . Altered mental status 01/09/2017  . Facial droop 01/09/2017  . Cerebral thrombosis with cerebral infarction 01/09/2017  . Seizures ([place])   . Other  hyperlipidemia   . OSA (obstructive sleep apnea) 09/12/2016  . Weight loss 09/12/2016  . Malnutrition of moderate degree 09/11/2016  . Syncope 09/10/2016  . H/O agent Orange exposure 12/24/2015  . Type 2 diabetes with nephropathy ([place])   . Near syncope 12/22/2015  . History of TIAs 09/14/2015  . CKD stage 3 due to type 2 diabetes mellitus ([place]) 09/14/2015  . Acute lower GI bleeding 10/30/2014  . History of colonic polyps 10/30/2014  . Depression 10/30/2014  . Symptomatic anemia 10/30/2014  . AKI (acute kidney injury) ([place]) 10/30/2014  . Ataxic gait 09/21/2014  . History of CVA (cerebrovascular accident) 02/22/2013  . Abnormal brain scan 02/21/2013  . Dizziness 02/21/2013  . Weakness 02/21/2013  . Diabetes mellitus ([place]) 02/21/2013  . Hypertension 02/21/2013    Past Surgical History:  Procedure Laterality Date  . BONE GRAFT HIP ILIAC CREST Left ~ 1976  . CATARACT EXTRACTION W/ INTRAOCULAR LENS  IMPLANT, BILATERAL Bilateral 2014-2015  . EP IMPLANTABLE DEVICE N/A 12/24/2015   Procedure: Loop Recorder Insertion;  Surgeon: Thompson Grayer, MD;  Location: [facility];  Service: Cardiovascular;  Laterality: N/A;  . TUMOR REMOVAL Right ~ 1976   "arm; had to take bone left hip to add to the repair"       Home Medications    Prior to Admission medications   Medication Sig Start Date End Date Taking? Authorizing Provider  acetaminophen (TYLENOL) 500 MG tablet Take 1 tablet (500 mg total) by mouth every 6 (six) hours as needed for mild pain, moderate pain, fever or headache. 11/01/14  Yes Rabbani, Ricarda Frame, MD  atorvastatin (LIPITOR) 20 MG tablet Take 1 tablet (20 mg total) by mouth daily at 6 PM. 09/12/16  Yes Asencion Partridge, MD  carvedilol (COREG) 12.5 MG tablet Take 1 tablet (12.5 mg total) by mouth 2 (two) times daily with a meal. 01/20/17  Yes Molt, Bethany, DO  cetirizine (ZYRTEC) 10 MG tablet Take 10 mg by mouth at bedtime as needed for allergies.   Yes [provider]    chlorthalidone (HYGROTON) 25 MG tablet Take 25 mg by mouth daily.   Yes [provider]  Cholecalciferol (VITAMIN D3) 2000 units TABS Take 2,000 Units by mouth daily.   Yes [provider]  cloNIDine (CATAPRES - DOSED IN MG/24 HR) 0.2 mg/24hr patch Place 0.2 mg onto the skin once a week.   Yes [provider]  clopidogrel (PLAVIX) 75 MG tablet Take 1 tablet (75 mg total) by mouth at bedtime. Patient taking differently: Take 75 mg by mouth daily.  11/08/14  Yes Juluis Mire, MD  feeding supplement, ENSURE ENLIVE, (ENSURE ENLIVE) LIQD Take 237 mLs by mouth 2 (two) times daily after a meal. 09/12/16  Yes Asencion Partridge, MD  gabapentin (NEURONTIN) 300 MG capsule Take 300 mg by mouth at bedtime.    Yes [provider]  latanoprost (  XALATAN) 0.005 % ophthalmic solution Place 1 drop into both eyes at bedtime.    Yes [provider]  losartan (COZAAR) 100 MG tablet Take 100 mg by mouth daily.   Yes [provider]  potassium chloride (K-DUR,KLOR-CON) 10 MEQ tablet Take 10 mEq by mouth 2 (two) times daily.   Yes [provider]  sertraline (ZOLOFT) 100 MG tablet Take 100 mg by mouth daily.   Yes [provider]  Skin Protectants, Misc. (EUCERIN) cream Apply 1 application topically daily. FEET AND LEGS   Yes [provider]  tamsulosin (FLOMAX) 0.4 MG CAPS capsule Take 0.8 mg by mouth at bedtime.   Yes [provider]  oxyCODONE-acetaminophen (PERCOCET/ROXICET) 5-325 MG tablet Take 1 tablet by mouth every 8 (eight) hours as needed for severe pain. 10/09/17   Chandrika Sandles A, PA-C    Family History Family History  Problem Relation Age of Onset  . Diabetes Mother   . Breast cancer Mother   . Heart attack Mother        CABG - Age 14  . Kidney disease Mother   . Stroke Brother   . Lung disease Father   . Neurofibromatosis Maternal Uncle   . Throat cancer Brother   . Hypertension Brother     Social History Social  History   Tobacco Use  . Smoking status: Never Smoker  . Smokeless tobacco: Never Used  Substance Use Topics  . Alcohol use: Yes    Alcohol/week: 0.6 oz    Types: 1 Cans of beer per week    Comment: 10/30/2014 "might have a beer a couple times/yr"  . Drug use: No     Allergies   Metformin and related and Tape   Review of Systems Review of Systems  Constitutional: Negative for appetite change and fever.  Eyes: Negative for visual disturbance.  Respiratory: Negative for shortness of breath.   Cardiovascular: Negative for chest pain.  Gastrointestinal: Negative for abdominal pain, diarrhea, nausea and vomiting.  Genitourinary: Negative for dysuria.  Musculoskeletal: Positive for arthralgias, gait problem and joint swelling. Negative for back pain, myalgias, neck pain and neck stiffness.  Skin: Negative for color change, rash and wound.  Allergic/Immunologic: Negative for immunocompromised state.  Neurological: Positive for syncope, weakness (generalized) and light-headedness (chronic). Negative for dizziness, seizures and headaches.  Psychiatric/Behavioral: Negative for confusion.     Physical Exam Updated Vital Signs BP (!) 143/79 (BP Location: Right Arm)   Pulse 60   Temp 98.2 F (36.8 C)   Resp 16   SpO2 100%   Physical Exam  Constitutional: He appears well-developed and well-nourished. No distress.  HENT:  Head: Normocephalic.  Right Ear: External ear normal.  Left Ear: External ear normal.  Eyes: Conjunctivae and EOM are normal. Pupils are equal, round, and reactive to light.  Neck: Normal range of motion. Neck supple.  Cardiovascular: Normal rate, regular rhythm, normal heart sounds and intact distal pulses. Exam reveals no gallop and no friction rub.  No murmur heard. Pulmonary/Chest: Effort normal. No stridor. No respiratory distress. He has no wheezes. He has no rales. He exhibits no tenderness.  Abdominal: Soft. He exhibits no distension.  Musculoskeletal:  He exhibits tenderness. He exhibits no deformity.  Mild edema noted to the knees superiorly. significant tenderness to palpation over the medial aspect and joint line of the right knee.  Minimal tenderness to palpation over the lateral joint line and aspect of the knee.  Tender to palpation over the right tibial  plateau no posterior knee tenderness.. No tenderness to the quadriceps or patellar tendon.  Negative anterior posterior drawer test.  Significant pain and some laxity with valgus stress test.  Negative varus stress test.  Full active and passive range of motion of the joint with pain.  Normal exam of the left knee, right ankle, and right hip.  Neurological: He is alert.  Cranial nerves 2-12 intact. Finger-to-nose is normal. 5/5 motor strength of the bilateral upper and lower extremities. Moves all four extremities. Negative Romberg.  Antalgic gait. NVI.    Skin: Skin is warm and dry. He is not diaphoretic.  Psychiatric: His behavior is normal.  Nursing note and vitals reviewed.    ED Treatments / Results  Labs (all labs ordered are listed, but only abnormal results are displayed) Labs Reviewed  BASIC METABOLIC PANEL - Abnormal; Notable for the following components:      Result Value   Glucose, Bld 149 (*)    BUN 25 (*)    Creatinine, Ser 2.13 (*)    GFR calc non Af Amer 30 (*)    GFR calc Af Amer 34 (*)    All other components within normal limits  CBC - Abnormal; Notable for the following components:   RBC 4.00 (*)    Hemoglobin 11.9 (*)    HCT 34.9 (*)    All other components within normal limits  URINALYSIS, ROUTINE W REFLEX MICROSCOPIC - Abnormal; Notable for the following components:   APPearance CLOUDY (*)    Hgb urine dipstick MODERATE (*)    Protein, ur >=300 (*)    Leukocytes, UA MODERATE (*)    Bacteria, UA MANY (*)    Squamous Epithelial / LPF 0-5 (*)    All other components within normal limits  CBG MONITORING, ED - Abnormal; Notable for the following  components:   Glucose-Capillary 140 (*)    All other components within normal limits  URINE CULTURE    EKG  EKG Interpretation None       Radiology Ct Knee Right Wo Contrast  Result Date: 10/09/2017 CLINICAL DATA:  Acute right knee pain after fall last week. EXAM: CT OF THE right KNEE WITHOUT CONTRAST TECHNIQUE: Multidetector CT imaging of the right knee was performed according to the standard protocol. Multiplanar CT image reconstructions were also generated. COMPARISON:  Radiographs of same day. FINDINGS: No fracture or dislocation is noted. Joint spaces are intact. Mild spurring of patella is noted. Mild suprapatellar joint effusion is noted. The quadriceps and infrapatellar tendons appear intact. The cruciate ligaments are visualized and appear grossly intact, although tear cannot be excluded on this exam. IMPRESSION: No fracture or dislocation is noted. Mild patellar spurring. Mild suprapatellar joint effusion is noted. If there is concern for cruciate ligament injury, MRI is recommended for further evaluation. Electronically Signed   By: Marijo Conception, M.D.   On: 10/09/2017 17:58   Dg Knee Complete 4 Views Right  Result Date: 10/09/2017 CLINICAL DATA:  Fall 5 days ago.  Pain and swelling. EXAM: RIGHT KNEE - COMPLETE 4+ VIEW COMPARISON:  None. FINDINGS: Right knee is located. A large joint effusion is present. There is no fracture or subluxation. Medial superficial soft tissue swelling is present as well. IMPRESSION: 1. Large joint effusion without associated fracture. Internal derangement is not excluded. Electronically Signed   By: San Morelle M.D.   On: 10/09/2017 13:47    Procedures Procedures (including critical care time)  Medications Ordered in ED Medications  oxyCODONE-acetaminophen (PERCOCET/ROXICET)  5-325 MG per tablet 1 tablet (1 tablet Oral Given 10/09/17 1557)  carvedilol (COREG) tablet 12.5 mg (12.5 mg Oral Given 10/09/17 1735)  losartan (COZAAR) tablet 100 mg  (100 mg Oral Given 10/09/17 1735)  cefTRIAXone (ROCEPHIN) 1 g in dextrose 5 % 50 mL IVPB (0 g Intravenous Stopped 10/09/17 1945)     Initial Impression / Assessment and Plan / ED Course  I have reviewed the triage vital signs and the nursing notes.  Pertinent labs & imaging results that were available during my care of the patient were reviewed by me and considered in my medical decision making (see chart for details).     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  He had a syncopal episode in his shower 5 days ago fell onto his bilateral knees.  He is here to have his right knee pain evaluated.  He has a chronic history of syncope, which has been worked up by his PCP at the [facility].  No new symptoms with the most recent syncopal episode he states that it was after taking all of his blood pressure medications at one time without eating, which has been the source of previous syncopal episodes.  No focal neurological deficits on physical exam.  He has significant tenderness over the medial aspect and joint line of the right knee with mild edema.  Right knee x-ray with large joint effusion without associated fracture or internal derangement is not excluded.  EKG without acute changes.  Labs are consistent with the patient's baseline.  The patient was discussed and evaluated with Dr. Vallery Ridge, attending physician.  CT is negative for fracture of the tibial plateau, a mild suprapatellar joint effusion is noted.  The patient was placed in a knee immobilizer and was ambulated by me through the department with his cane.  He reports that he feels steady on his feet and his pain is significantly improved.  The patient is established with orthopedics at the [facility].  We will discharge the patient to home with Percocet for pain control and follow-up with orthopedics.  I have also provided him with a referral to an orthopedic  surgeon that is on call for our practice if he is unable to follow-up with [facility].  Strict return precautions given.  He is in no acute distress at this time.  The patient is safe for discharge to home. A 60-month prescription history query was performed using the  CSRS prior to discharge.   Final Clinical Impressions(s) / ED Diagnoses   Final diagnoses:  Injury of right knee, initial encounter  History of syncope    ED Discharge Orders        Ordered    oxyCODONE-acetaminophen (PERCOCET/ROXICET) 5-325 MG tablet  Every 8 hours PRN     10/09/17 2004       McDonaldLaymond Purser, PA-C 10/10/17 1031    Charlesetta Shanks, MD 10/14/17 [phone]

## 2017-10-09 NOTE — Discharge Instructions (Signed)
Please call the [facility] tomorrow morning to schedule a follow-up appointment with orthopedics.  If they are unable to see you in the next few days, you can call Dr. Lorin Mercy who is the on-call orthopedic surgeon for was a long today.  Please wear the knee immobilizer until you are seen by orthopedics.  You may take it off to sit and bathe, please do not stand or shower without the brace on until you are seen by orthopedics.  Take 650 mg of Tylenol once every 6 hours as needed for pain control or one tablet of Percocet every 8 hours for severe pain.  Please note, each tablet of Percocet has 325 mg of Tylenol.  Is important not to take a total of 4000 mg of Tylenol in a 24-hour period please do not drive while taking Percocet because it can cause you to be impaired.  It is also a narcotic and can be addicting.  If you develop new or worsening symptoms, including if you have another fall or injury, pass out or become more dizzy, or develop other concerning symptoms, please return to the emergency department for re-evaluation.

## 2017-10-10 ENCOUNTER — Ambulatory Visit: Payer: Medicare Other | Admitting: Physical Therapy

## 2017-10-11 LAB — URINE CULTURE

## 2017-10-15 ENCOUNTER — Ambulatory Visit (INDEPENDENT_AMBULATORY_CARE_PROVIDER_SITE_OTHER): Payer: Medicare Other | Admitting: *Deleted

## 2017-10-15 DIAGNOSIS — I639 Cerebral infarction, unspecified: Secondary | ICD-10-CM

## 2017-10-16 NOTE — Progress Notes (Signed)
Carelink Summary Report / Loop Recorder 

## 2017-11-11 ENCOUNTER — Emergency Department (HOSPITAL_COMMUNITY): Payer: Medicare Other

## 2017-11-11 ENCOUNTER — Other Ambulatory Visit: Payer: Self-pay

## 2017-11-11 ENCOUNTER — Encounter (HOSPITAL_COMMUNITY): Payer: Self-pay | Admitting: Emergency Medicine

## 2017-11-11 ENCOUNTER — Observation Stay (HOSPITAL_COMMUNITY): Payer: Medicare Other

## 2017-11-11 ENCOUNTER — Inpatient Hospital Stay (HOSPITAL_COMMUNITY)
Admission: EM | Admit: 2017-11-11 | Discharge: 2017-11-18 | DRG: 872 | Disposition: A | Discharge status: Skilled Nursing Facility | Payer: Medicare Other | Attending: Internal Medicine | Admitting: Internal Medicine

## 2017-11-11 DIAGNOSIS — I152 Hypertension secondary to endocrine disorders: Secondary | ICD-10-CM | POA: Diagnosis present

## 2017-11-11 DIAGNOSIS — Z79899 Other long term (current) drug therapy: Secondary | ICD-10-CM

## 2017-11-11 DIAGNOSIS — R197 Diarrhea, unspecified: Secondary | ICD-10-CM | POA: Diagnosis present

## 2017-11-11 DIAGNOSIS — D696 Thrombocytopenia, unspecified: Secondary | ICD-10-CM | POA: Diagnosis present

## 2017-11-11 DIAGNOSIS — Z8673 Personal history of transient ischemic attack (TIA), and cerebral infarction without residual deficits: Secondary | ICD-10-CM

## 2017-11-11 DIAGNOSIS — Z87898 Personal history of other specified conditions: Secondary | ICD-10-CM

## 2017-11-11 DIAGNOSIS — E1122 Type 2 diabetes mellitus with diabetic chronic kidney disease: Secondary | ICD-10-CM | POA: Diagnosis present

## 2017-11-11 DIAGNOSIS — N4341 Spermatocele of epididymis, single: Secondary | ICD-10-CM | POA: Diagnosis present

## 2017-11-11 DIAGNOSIS — I69351 Hemiplegia and hemiparesis following cerebral infarction affecting right dominant side: Secondary | ICD-10-CM

## 2017-11-11 DIAGNOSIS — Z841 Family history of disorders of kidney and ureter: Secondary | ICD-10-CM

## 2017-11-11 DIAGNOSIS — N1832 Chronic kidney disease, stage 3b: Secondary | ICD-10-CM | POA: Diagnosis present

## 2017-11-11 DIAGNOSIS — Z9109 Other allergy status, other than to drugs and biological substances: Secondary | ICD-10-CM

## 2017-11-11 DIAGNOSIS — R633 Feeding difficulties: Secondary | ICD-10-CM | POA: Diagnosis present

## 2017-11-11 DIAGNOSIS — R6520 Severe sepsis without septic shock: Secondary | ICD-10-CM | POA: Diagnosis present

## 2017-11-11 DIAGNOSIS — F4310 Post-traumatic stress disorder, unspecified: Secondary | ICD-10-CM | POA: Diagnosis present

## 2017-11-11 DIAGNOSIS — Z86010 Personal history of colonic polyps: Secondary | ICD-10-CM

## 2017-11-11 DIAGNOSIS — R066 Hiccough: Secondary | ICD-10-CM | POA: Diagnosis present

## 2017-11-11 DIAGNOSIS — Z7902 Long term (current) use of antithrombotics/antiplatelets: Secondary | ICD-10-CM

## 2017-11-11 DIAGNOSIS — F05 Delirium due to known physiological condition: Secondary | ICD-10-CM | POA: Diagnosis present

## 2017-11-11 DIAGNOSIS — A409 Streptococcal sepsis, unspecified: Principal | ICD-10-CM | POA: Diagnosis present

## 2017-11-11 DIAGNOSIS — N141 Nephropathy induced by other drugs, medicaments and biological substances: Secondary | ICD-10-CM | POA: Diagnosis present

## 2017-11-11 DIAGNOSIS — N433 Hydrocele, unspecified: Secondary | ICD-10-CM | POA: Diagnosis present

## 2017-11-11 DIAGNOSIS — F32A Depression, unspecified: Secondary | ICD-10-CM | POA: Diagnosis present

## 2017-11-11 DIAGNOSIS — N183 Chronic kidney disease, stage 3 unspecified: Secondary | ICD-10-CM | POA: Diagnosis present

## 2017-11-11 DIAGNOSIS — Z888 Allergy status to other drugs, medicaments and biological substances status: Secondary | ICD-10-CM

## 2017-11-11 DIAGNOSIS — R509 Fever, unspecified: Secondary | ICD-10-CM

## 2017-11-11 DIAGNOSIS — E1140 Type 2 diabetes mellitus with diabetic neuropathy, unspecified: Secondary | ICD-10-CM | POA: Diagnosis present

## 2017-11-11 DIAGNOSIS — R4182 Altered mental status, unspecified: Secondary | ICD-10-CM | POA: Diagnosis not present

## 2017-11-11 DIAGNOSIS — R29810 Facial weakness: Secondary | ICD-10-CM | POA: Diagnosis present

## 2017-11-11 DIAGNOSIS — N400 Enlarged prostate without lower urinary tract symptoms: Secondary | ICD-10-CM | POA: Diagnosis present

## 2017-11-11 DIAGNOSIS — M19071 Primary osteoarthritis, right ankle and foot: Secondary | ICD-10-CM | POA: Diagnosis present

## 2017-11-11 DIAGNOSIS — M1991 Primary osteoarthritis, unspecified site: Secondary | ICD-10-CM | POA: Diagnosis present

## 2017-11-11 DIAGNOSIS — E11319 Type 2 diabetes mellitus with unspecified diabetic retinopathy without macular edema: Secondary | ICD-10-CM | POA: Diagnosis present

## 2017-11-11 DIAGNOSIS — A419 Sepsis, unspecified organism: Secondary | ICD-10-CM | POA: Diagnosis present

## 2017-11-11 DIAGNOSIS — D649 Anemia, unspecified: Secondary | ICD-10-CM | POA: Diagnosis present

## 2017-11-11 DIAGNOSIS — R410 Disorientation, unspecified: Secondary | ICD-10-CM

## 2017-11-11 DIAGNOSIS — N5082 Scrotal pain: Secondary | ICD-10-CM

## 2017-11-11 DIAGNOSIS — H548 Legal blindness, as defined in USA: Secondary | ICD-10-CM | POA: Diagnosis present

## 2017-11-11 DIAGNOSIS — F329 Major depressive disorder, single episode, unspecified: Secondary | ICD-10-CM | POA: Diagnosis present

## 2017-11-11 DIAGNOSIS — Z833 Family history of diabetes mellitus: Secondary | ICD-10-CM

## 2017-11-11 DIAGNOSIS — R234 Changes in skin texture: Secondary | ICD-10-CM

## 2017-11-11 DIAGNOSIS — R823 Hemoglobinuria: Secondary | ICD-10-CM | POA: Diagnosis present

## 2017-11-11 DIAGNOSIS — E11621 Type 2 diabetes mellitus with foot ulcer: Secondary | ICD-10-CM | POA: Diagnosis present

## 2017-11-11 DIAGNOSIS — Z794 Long term (current) use of insulin: Secondary | ICD-10-CM

## 2017-11-11 DIAGNOSIS — N179 Acute kidney failure, unspecified: Secondary | ICD-10-CM | POA: Diagnosis present

## 2017-11-11 DIAGNOSIS — E119 Type 2 diabetes mellitus without complications: Secondary | ICD-10-CM

## 2017-11-11 DIAGNOSIS — F0390 Unspecified dementia without behavioral disturbance: Secondary | ICD-10-CM | POA: Diagnosis present

## 2017-11-11 DIAGNOSIS — T368X5A Adverse effect of other systemic antibiotics, initial encounter: Secondary | ICD-10-CM | POA: Diagnosis present

## 2017-11-11 DIAGNOSIS — I10 Essential (primary) hypertension: Secondary | ICD-10-CM | POA: Diagnosis present

## 2017-11-11 DIAGNOSIS — Z7951 Long term (current) use of inhaled steroids: Secondary | ICD-10-CM

## 2017-11-11 DIAGNOSIS — G4733 Obstructive sleep apnea (adult) (pediatric): Secondary | ICD-10-CM | POA: Diagnosis present

## 2017-11-11 DIAGNOSIS — L97519 Non-pressure chronic ulcer of other part of right foot with unspecified severity: Secondary | ICD-10-CM | POA: Diagnosis present

## 2017-11-11 DIAGNOSIS — Z823 Family history of stroke: Secondary | ICD-10-CM

## 2017-11-11 DIAGNOSIS — Z8249 Family history of ischemic heart disease and other diseases of the circulatory system: Secondary | ICD-10-CM

## 2017-11-11 DIAGNOSIS — I129 Hypertensive chronic kidney disease with stage 1 through stage 4 chronic kidney disease, or unspecified chronic kidney disease: Secondary | ICD-10-CM | POA: Diagnosis present

## 2017-11-11 DIAGNOSIS — L84 Corns and callosities: Secondary | ICD-10-CM | POA: Diagnosis present

## 2017-11-11 LAB — CBC WITH DIFFERENTIAL/PLATELET
BASOS ABS: 0 10*3/uL (ref 0.0–0.1)
BASOS PCT: 0 %
EOS ABS: 0 10*3/uL (ref 0.0–0.7)
Eosinophils Relative: 0 %
HCT: 36 % — ABNORMAL LOW (ref 39.0–52.0)
HEMOGLOBIN: 12.1 g/dL — AB (ref 13.0–17.0)
Lymphocytes Relative: 8 %
Lymphs Abs: 0.5 10*3/uL — ABNORMAL LOW (ref 0.7–4.0)
MCH: 30 pg (ref 26.0–34.0)
MCHC: 33.6 g/dL (ref 30.0–36.0)
MCV: 89.3 fL (ref 78.0–100.0)
MONOS PCT: 3 %
Monocytes Absolute: 0.2 10*3/uL (ref 0.1–1.0)
NEUTROS PCT: 89 %
Neutro Abs: 6.2 10*3/uL (ref 1.7–7.7)
Platelets: 104 10*3/uL — ABNORMAL LOW (ref 150–400)
RBC: 4.03 MIL/uL — ABNORMAL LOW (ref 4.22–5.81)
RDW: 13.2 % (ref 11.5–15.5)
WBC: 6.9 10*3/uL (ref 4.0–10.5)

## 2017-11-11 LAB — COMPREHENSIVE METABOLIC PANEL
ALK PHOS: 54 U/L (ref 38–126)
ALT: 16 U/L — AB (ref 17–63)
AST: 43 U/L — ABNORMAL HIGH (ref 15–41)
Albumin: 3.6 g/dL (ref 3.5–5.0)
Anion gap: 12 (ref 5–15)
BILIRUBIN TOTAL: 1 mg/dL (ref 0.3–1.2)
BUN: 24 mg/dL — ABNORMAL HIGH (ref 6–20)
CALCIUM: 9.3 mg/dL (ref 8.9–10.3)
CO2: 24 mmol/L (ref 22–32)
CREATININE: 2.45 mg/dL — AB (ref 0.61–1.24)
Chloride: 98 mmol/L — ABNORMAL LOW (ref 101–111)
GFR, EST AFRICAN AMERICAN: 29 mL/min — AB (ref >60)
GFR, EST NON AFRICAN AMERICAN: 25 mL/min — AB (ref >60)
Glucose, Bld: 146 mg/dL — ABNORMAL HIGH (ref 65–99)
Potassium: 3.7 mmol/L (ref 3.5–5.1)
SODIUM: 134 mmol/L — AB (ref 135–145)
TOTAL PROTEIN: 6.9 g/dL (ref 6.5–8.1)

## 2017-11-11 LAB — URINALYSIS, ROUTINE W REFLEX MICROSCOPIC
Bilirubin Urine: NEGATIVE
Glucose, UA: NEGATIVE mg/dL
Ketones, ur: 5 mg/dL — AB
Leukocytes, UA: NEGATIVE
Nitrite: NEGATIVE
Protein, ur: >=300 mg/dL — AB
SPECIFIC GRAVITY, URINE: 1.011 (ref 1.005–1.030)
Squamous Epithelial / LPF: NONE SEEN
pH: 5 (ref 5.0–8.0)

## 2017-11-11 LAB — INFLUENZA PANEL BY PCR (TYPE A & B)
Influenza A By PCR: NEGATIVE
Influenza B By PCR: NEGATIVE

## 2017-11-11 LAB — I-STAT CG4 LACTIC ACID, ED: Lactic Acid, Venous: 1.57 mmol/L (ref 0.5–1.9)

## 2017-11-11 MED ORDER — INSULIN ASPART 100 UNIT/ML ~~LOC~~ SOLN
0.0000 [IU] | Freq: Three times a day (TID) | SUBCUTANEOUS | Status: DC
  Administered 2017-11-12 (×2): 1 [IU] via SUBCUTANEOUS
  Administered 2017-11-14 (×2): 2 [IU] via SUBCUTANEOUS
  Administered 2017-11-14: 1 [IU] via SUBCUTANEOUS
  Administered 2017-11-15 (×2): 2 [IU] via SUBCUTANEOUS
  Administered 2017-11-15: 1 [IU] via SUBCUTANEOUS
  Administered 2017-11-16: 3 [IU] via SUBCUTANEOUS
  Administered 2017-11-16: 1 [IU] via SUBCUTANEOUS
  Administered 2017-11-17: 2 [IU] via SUBCUTANEOUS
  Administered 2017-11-17 (×2): 1 [IU] via SUBCUTANEOUS
  Administered 2017-11-18 (×2): 2 [IU] via SUBCUTANEOUS

## 2017-11-11 MED ORDER — CARVEDILOL 25 MG PO TABS
25.0000 mg | ORAL_TABLET | Freq: Two times a day (BID) | ORAL | Status: DC
  Administered 2017-11-11 – 2017-11-17 (×13): 25 mg via ORAL
  Filled 2017-11-11 (×13): qty 1

## 2017-11-11 MED ORDER — SODIUM CHLORIDE 0.9 % IV BOLUS (SEPSIS)
1000.0000 mL | Freq: Once | INTRAVENOUS | Status: AC
  Administered 2017-11-11: 1000 mL via INTRAVENOUS

## 2017-11-11 MED ORDER — POLYETHYLENE GLYCOL 3350 17 G PO PACK: 17.0000 g | PACK | Freq: Every day | ORAL | Status: DC | PRN

## 2017-11-11 MED ORDER — CLONIDINE HCL 0.2 MG/24HR TD PTWK
0.2000 mg | MEDICATED_PATCH | TRANSDERMAL | Status: DC
  Administered 2017-11-11: 0.2 mg via TRANSDERMAL
  Filled 2017-11-11: qty 1

## 2017-11-11 MED ORDER — SODIUM CHLORIDE 0.9 % IV SOLN
INTRAVENOUS | Status: AC
  Administered 2017-11-11: 21:00:00 via INTRAVENOUS

## 2017-11-11 MED ORDER — INSULIN ASPART 100 UNIT/ML ~~LOC~~ SOLN
0.0000 [IU] | Freq: Every day | SUBCUTANEOUS | Status: DC
  Administered 2017-11-16: 2 [IU] via SUBCUTANEOUS

## 2017-11-11 MED ORDER — DORZOLAMIDE HCL-TIMOLOL MAL 2-0.5 % OP SOLN
1.0000 [drp] | Freq: Two times a day (BID) | OPHTHALMIC | Status: DC
  Administered 2017-11-11 – 2017-11-18 (×12): 1 [drp] via OPHTHALMIC
  Filled 2017-11-11: qty 10

## 2017-11-11 MED ORDER — SODIUM CHLORIDE 0.9 % IV SOLN: 1250.0000 mg | INTRAVENOUS | Status: DC

## 2017-11-11 MED ORDER — ACETAMINOPHEN 650 MG RE SUPP
650.0000 mg | Freq: Once | RECTAL | Status: AC
  Administered 2017-11-11: 650 mg via RECTAL
  Filled 2017-11-11: qty 1

## 2017-11-11 MED ORDER — LATANOPROST 0.005 % OP SOLN
1.0000 [drp] | Freq: Every day | OPHTHALMIC | Status: DC
  Administered 2017-11-11 – 2017-11-17 (×6): 1 [drp] via OPHTHALMIC
  Filled 2017-11-11: qty 2.5

## 2017-11-11 MED ORDER — TAMSULOSIN HCL 0.4 MG PO CAPS
0.8000 mg | ORAL_CAPSULE | Freq: Every day | ORAL | Status: DC
  Administered 2017-11-11 – 2017-11-17 (×6): 0.8 mg via ORAL
  Filled 2017-11-11 (×7): qty 2

## 2017-11-11 MED ORDER — ATORVASTATIN CALCIUM 40 MG PO TABS
40.0000 mg | ORAL_TABLET | Freq: Every day | ORAL | Status: DC
  Administered 2017-11-11 – 2017-11-17 (×7): 40 mg via ORAL
  Filled 2017-11-11 (×7): qty 1

## 2017-11-11 MED ORDER — PIPERACILLIN-TAZOBACTAM 3.375 G IVPB 30 MIN
3.3750 g | Freq: Once | INTRAVENOUS | Status: AC
  Administered 2017-11-11: 3.375 g via INTRAVENOUS
  Filled 2017-11-11: qty 50

## 2017-11-11 MED ORDER — POLYVINYL ALCOHOL 1.4 % OP SOLN
1.0000 [drp] | Freq: Four times a day (QID) | OPHTHALMIC | Status: DC | PRN
  Filled 2017-11-11: qty 15

## 2017-11-11 MED ORDER — CLOPIDOGREL BISULFATE 75 MG PO TABS
75.0000 mg | ORAL_TABLET | Freq: Every day | ORAL | Status: DC
  Administered 2017-11-11 – 2017-11-17 (×6): 75 mg via ORAL
  Filled 2017-11-11 (×7): qty 1

## 2017-11-11 MED ORDER — PIPERACILLIN-TAZOBACTAM 3.375 G IVPB
3.3750 g | Freq: Three times a day (TID) | INTRAVENOUS | Status: DC
  Filled 2017-11-11 (×2): qty 50

## 2017-11-11 MED ORDER — CARVEDILOL 25 MG PO TABS: 25.0000 mg | ORAL_TABLET | Freq: Two times a day (BID) | ORAL | Status: DC

## 2017-11-11 MED ORDER — MELATONIN 3 MG PO TABS
3.0000 mg | ORAL_TABLET | Freq: Two times a day (BID) | ORAL | Status: DC | PRN
  Administered 2017-11-13 – 2017-11-17 (×3): 3 mg via ORAL
  Filled 2017-11-11 (×5): qty 1

## 2017-11-11 MED ORDER — MONTELUKAST SODIUM 10 MG PO TABS
10.0000 mg | ORAL_TABLET | Freq: Every day | ORAL | Status: DC
  Administered 2017-11-11 – 2017-11-17 (×6): 10 mg via ORAL
  Filled 2017-11-11 (×7): qty 1

## 2017-11-11 MED ORDER — ACETAMINOPHEN 650 MG RE SUPP
650.0000 mg | RECTAL | Status: DC | PRN
  Administered 2017-11-11: 650 mg via RECTAL
  Filled 2017-11-11: qty 1

## 2017-11-11 MED ORDER — CARBOXYMETHYLCELLULOSE SOD PF 0.25 % OP SOLN: 1.0000 [drp] | Freq: Four times a day (QID) | OPHTHALMIC | Status: DC

## 2017-11-11 MED ORDER — CHLORTHALIDONE 25 MG PO TABS: 25.0000 mg | ORAL_TABLET | Freq: Every day | ORAL | Status: DC

## 2017-11-11 MED ORDER — HEPARIN SODIUM (PORCINE) 5000 UNIT/ML IJ SOLN
5000.0000 [IU] | Freq: Three times a day (TID) | INTRAMUSCULAR | Status: DC
  Administered 2017-11-11 – 2017-11-14 (×7): 5000 [IU] via SUBCUTANEOUS
  Filled 2017-11-11 (×8): qty 1

## 2017-11-11 MED ORDER — HYDRALAZINE HCL 10 MG PO TABS
10.0000 mg | ORAL_TABLET | Freq: Four times a day (QID) | ORAL | Status: DC | PRN
  Administered 2017-11-12 – 2017-11-18 (×6): 10 mg via ORAL
  Filled 2017-11-11 (×7): qty 1

## 2017-11-11 MED ORDER — LOSARTAN POTASSIUM 50 MG PO TABS: 100.0000 mg | ORAL_TABLET | Freq: Every day | ORAL | Status: DC

## 2017-11-11 MED ORDER — VANCOMYCIN HCL IN DEXTROSE 750-5 MG/150ML-% IV SOLN
750.0000 mg | Freq: Two times a day (BID) | INTRAVENOUS | Status: DC
  Filled 2017-11-11: qty 150

## 2017-11-11 MED ORDER — BUPROPION HCL 75 MG PO TABS
75.0000 mg | ORAL_TABLET | Freq: Every day | ORAL | Status: DC
  Administered 2017-11-12 – 2017-11-18 (×7): 75 mg via ORAL
  Filled 2017-11-11 (×7): qty 1

## 2017-11-11 MED ORDER — SODIUM CHLORIDE 0.9 % IV SOLN
2.0000 g | INTRAVENOUS | Status: DC
  Administered 2017-11-11: 2 g via INTRAVENOUS
  Filled 2017-11-11: qty 2

## 2017-11-11 MED ORDER — VANCOMYCIN HCL IN DEXTROSE 1-5 GM/200ML-% IV SOLN
1000.0000 mg | Freq: Once | INTRAVENOUS | Status: AC
  Administered 2017-11-11: 1000 mg via INTRAVENOUS
  Filled 2017-11-11: qty 200

## 2017-11-11 MED ORDER — SERTRALINE HCL 100 MG PO TABS
100.0000 mg | ORAL_TABLET | Freq: Every day | ORAL | Status: DC
  Administered 2017-11-12 – 2017-11-18 (×7): 100 mg via ORAL
  Filled 2017-11-11 (×7): qty 1

## 2017-11-11 MED ORDER — MOMETASONE FURO-FORMOTEROL FUM 100-5 MCG/ACT IN AERO
2.0000 | INHALATION_SPRAY | Freq: Two times a day (BID) | RESPIRATORY_TRACT | Status: DC
  Administered 2017-11-12 – 2017-11-18 (×13): 2 via RESPIRATORY_TRACT
  Filled 2017-11-11: qty 8.8

## 2017-11-11 MED ORDER — ENSURE ENLIVE PO LIQD
237.0000 mL | Freq: Two times a day (BID) | ORAL | Status: DC
  Administered 2017-11-12 – 2017-11-18 (×8): 237 mL via ORAL

## 2017-11-11 NOTE — H&P (Signed)
Date: 11/11/2017               Patient Name:  Cody Silva MRN: [id number]  DOB: 05/11/1946 Age / Sex: 72 y.o., male   PCP: [facility], [place] Service: Internal Medicine Teaching Service         Attending Physician: Dr. Heber Falmouth, Rachel Moulds, DO    First Contact: Dr. Frederico Hamman  Pager: [phone]  Second Contact: Dr. 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 Patient is a poor historian and no family present in the room when seen. His only complaint if R foot pain. He is unable to provide information regarding onset of symptoms or recent history of trauma. He endorses chills and feeling cold. Otherwise has a pan-negative ROS including HA, dizziness, changes in vision, chest pain, SOB, cough, abdominal pain, N/V, changes in bowel movements, urinary symptoms and LE swelling. He is a [place] patient and records not available to review.    Called wife, Lyfe Monger: She reports she noticed a change in behavior on 3/8 but patient did not voice any complaints. On 3/9, patient was found seating in the toilet confused. When he stood up he was unable to walk and he had to be instructed to put one foot in front of another. Behavior continued to worsened throughout the day and he started to get lost around the house. His wife felt he was warm to the touch and measured his temperature which was 101. His only recent complaint is scrotal soreness. He was brought in to the hospital by family due to concern for stroke as he had a similar presentation in 08/2017. 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 At baseline, he  ambulated with a walker and is able to perform is ADLs independently. He is fully alert and oriented at baseline.   ED course: Patient was febrile with T 104.8, tachycardic with HR 124 and hypertensive BP 231/126 on presentation. He was oxygenating well on room air. Labwork remarkable for Hgb 12.1, Plt 104, BUN 24, and Cr 2.45. LA 1.5. UA with hemoglobinuria and proteinuria, but no LE or nitrites.  Flu negative. Blood cx x2 sent. EKG with ST depression on lateral leads, similar to prior. CXR without acute processes. Head CT without acute intracranial abnormalities. He was started on Vancomycin and Zosyn, Tylenol x2 (with minimal improvement in fever) and received 3L NS bolus.   Review of Systems: A complete ROS was negative except as per HPI.   Meds:  Current Meds  Medication Sig  . acetaminophen (TYLENOL) 500 MG tablet Take 1 tablet (500 mg total) by mouth every 6 (six) hours as needed for mild pain, moderate pain, fever or headache.  . albuterol (PROVENTIL HFA;VENTOLIN HFA) 108 (90 Base) MCG/ACT inhaler Inhale 1 puff into the lungs every 6 (six) hours as needed for wheezing or shortness of breath.  [name] ammonium lactate (LAC-HYDRIN) 12 % lotion Apply 1 application topically as needed for dry skin.  [name] atorvastatin (LIPITOR) 20 MG tablet Take 1 tablet (20 mg total) by mouth  daily at 6 PM. (Patient taking differently: Take 40 mg by mouth daily at 6 PM. )  . budesonide-formoterol (SYMBICORT) 80-4.5 MCG/ACT inhaler Inhale 2 puffs into the lungs 2 (two) times daily.  [name] buPROPion (WELLBUTRIN) 75 MG tablet Take 75 mg by mouth daily.  . Carboxymethylcellulose Sod PF 0.25 % SOLN Place 1 drop into both eyes 4 (four) times daily.  . carvedilol (COREG) 12.5 MG tablet Take 1 tablet (12.5 mg total) by mouth 2 (two) times daily with a meal. (Patient taking differently: Take 25 mg by mouth 2 (two) times daily with a meal. )  . cetirizine (ZYRTEC) 10 MG tablet Take 10 mg by mouth at bedtime as needed for allergies.    . chlorthalidone (HYGROTON) 25 MG tablet Take 25 mg by mouth daily.  . Cholecalciferol (VITAMIN D3) 2000 units TABS Take 2,000 Units by mouth daily.  . cloNIDine (CATAPRES - DOSED IN MG/24 HR) 0.2 mg/24hr patch Place 0.2 mg onto the skin once a week.  . clopidogrel (PLAVIX) 75 MG tablet Take 1 tablet (75 mg total) by mouth at bedtime. (Patient taking differently: Take 75 mg by mouth daily. )  . dorzolamide-timolol (COSOPT) 22.3-6.8 MG/ML ophthalmic solution Place 1 drop into both eyes as needed.  . fluticasone (FLONASE) 50 MCG/ACT nasal spray Place 1 spray into both nostrils daily.  [name] gabapentin (NEURONTIN) 300 MG capsule Take 300 mg by mouth at bedtime.   . insulin aspart (NOVOLOG) 100 UNIT/ML injection Inject 5-7 Units into the skin as needed for high blood sugar. Takes 5-7 units if the blood sugar is over 150  . ketotifen (ZADITOR) 0.025 % ophthalmic solution Place 1 drop into both eyes as needed.  . latanoprost (XALATAN) 0.005 % ophthalmic solution Place 1 drop into both eyes at bedtime.   [name] losartan (COZAAR) 100 MG tablet Take 100 mg by mouth daily.  . Melatonin 3 MG TABS Take 3 mg by mouth at bedtime.  . montelukast (SINGULAIR) 10 MG tablet Take 10 mg by mouth at bedtime.  [name] oxyCODONE-acetaminophen (PERCOCET/ROXICET) 5-325 MG tablet Take 1 tablet by mouth every 8 (eight) hours as needed for severe pain.  . potassium chloride (K-DUR,KLOR-CON) 10 MEQ tablet Take 10 mEq by mouth 2 (two) times daily.  . sertraline (ZOLOFT) 100 MG tablet Take 100 mg by mouth daily.  . tamsulosin (FLOMAX) 0.4 MG CAPS capsule Take 0.8 mg by mouth at bedtime.  . terbinafine (LAMISIL) 1 % cream Apply 1 application topically 2 (two) times daily.  [name] tolnaftate (TINACTIN) 1 % powder Apply 1 application topically 2 (two) times daily.    Allergies: Allergies as of 11/11/2017 - Review Complete 11/11/2017  Allergen Reaction Noted  . Metformin and related Diarrhea 06/12/2013  . Tape Rash 01/02/2017   Past Medical  History:  Diagnosis Date  . Anemia   . Arthritis    "right arm, right ankle, right side" (10/30/2014)  . Colon polyps   . Diabetic neuropathy ([place])   . Diabetic retinopathy ([place])   . H/O agent Orange exposure   . Headache    "@ least 3 times/wk" (10/30/2014)  . History of blood transfusion 10/30/2014   hematochezia  . History of gout   . Hypertension   . OSA on CPAP    "suppose to wear mask; I've got a call in for an equipment change" (10/30/2014)  . PTSD (post-traumatic stress disorder)    "service related"  . Seizures ([place])   . Stroke [facility]) 2014   left extremity deficits;  facial left  . Type II diabetes mellitus (HCC)     Family History:  Family History  Problem Relation Age of Onset  . Diabetes Mother   . Breast cancer Mother   . Heart attack Mother        CABG - Age 63  . Kidney disease Mother   . Stroke Brother   . Lung disease Father   . Neurofibromatosis Maternal Uncle   . Throat cancer Brother   . Hypertension Brother     Social History:  Patient states he lives at home with his wife and 42 yo daughter. He used to drink alcohol in the past, but no alcohol use in the present. Never smoker. Denies drug use.   Physical Exam: Blood pressure (!) 178/82, pulse 87, temperature 99.2 F (37.3 C), temperature source Oral, resp. rate 18, height 6\' 1"  (1.854 m), weight 184 lb 12.8 oz (83.8 kg), SpO2 100 %.  Physical Exam  Constitutional: He appears well-developed and well-nourished.  Elderly male lying in bed in no acute distress. He feels warm to the touch.   HENT:  Head: Normocephalic and atraumatic.  Mouth/Throat: No oropharyngeal exudate.  Eyes: Conjunctivae are normal. No scleral icterus.  Neck: Normal range of motion. Neck supple.  Cardiovascular: Regular rhythm and normal heart sounds. Exam reveals no gallop and no friction rub.  No murmur heard. Tachycardic   Pulmonary/Chest: Effort normal and breath sounds normal. No respiratory distress. He has no wheezes.  He has no rales.  Abdominal: Soft. Bowel sounds are normal. He exhibits no distension. There is no tenderness.  Musculoskeletal: He exhibits no edema.  Lymphadenopathy:    He has no cervical adenopathy.  Neurological: He is alert.  Patient oriented to self and place, but not time (oriented x 3 at baseline). He is able to follow commands. He is aware he is confused.   Skin:  Callus on R lateral foot associated with dry blood. No discharge noted. There is also a linear wound at the base of the toes on plantar surface of R foot that is associated with mild erythema around the wound. No ulcers/erosions noted. No pressure injuries noted.      EKG: personally reviewed my interpretation is ST with HR 130, ST depression V4-V6 (similar to prior), nl intervals, no axis deviation, no ST elevations   CXR: personally reviewed my interpretation is no abnormalities seen on lung fields   Assessment & Plan by Problem: Principal Problem:   Sepsis ([place]) Active Problems:   Diabetes mellitus ([place])   Hypertension   History of CVA (cerebrovascular accident)   Depression   AKI (acute kidney injury) ([place])   CKD stage 3 due to type 2 diabetes mellitus ([place])  Cody Silva is a 72 yo male with PMH of W0JW complicated by diabetic retinopathy, CKD, and 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  # SIRS: Patient presents with 2-3 day history of confusion and new onset of high grade fever. Workup thus far unrevealing including CXR, UA, EKG, and head CT. Blood cx pending. He does have a large callus on R lateral foot as well as a linear wound at the base of the toes associated with erythema that could be the possible source of infection. R foot XR without bone erosions. Per wife, patient complained of scrotal soreness yesterday when being bathe. Will further examine tomorrow.  - Continue Vancomycin and Cefepime  - IVF hydration  with NS @ 100 cc/hr   - Follow up blood cx  - On telemetry  - CBC and BMP in AM   # HTN: Patient is on chlorthalidone 25 mg QD, Coreg 25 mg BID, and clonidine patch 0.2 mg. Per ED RN note, clonidine fell from arm soon after patient arrived. Suspect his significantly elevated BPs are due to rebound HTN. Will resume home medications and continue to monitor BP.  - Continue home Coreg 25 mg BID - Continue home clonidine patch 0.2mg   - Holding home chlorthalidone in the setting of AKI   # T2DM: Patient was insulin dependent in the past. Per wife, he lost 30 lbs in 3 months in the recent past. He was worked up for this at the [place] with no etiology found. Since then, his DM has been well controlled and he has not required insulin. His last A1c is 5.5, unclear when this was obtained.  - SSI-S  - HS scale  - CBG monitoring   # AKI on CKD: Per chart patient has a history of CKD secondary to T2DM. Cr 2.45 on admission. His baseline renal function appears to be Cr ~2.0-2.1.  - Monitoring electrolytes  - Avoid nephrotoxic agents   # History of CVA: Multiple CVAs in the past. Most recent CVA in 08/2017 in residual R-sided deficits. Follows up with [facility].  - Continue home Plavix 75 mg QD  - Continue home atorvastatin 40 mg QD   # ?Depression: Patient med list medication includes Wellbutrin and Zoloft.  - Continue home Bupropion 75 mg QD - Continue home Zoloft 100 mg QD   # ?BPH: - Continue home Tamsulosin 0.4 mg QD    F: NS @ 100 cc/hr  E: monitoring  N: HH/CM diet   VTE ppx: SQ heparin   Full code: Full code, confirmed on admission   Dispo: Admit patient to Observation with expected length of stay less than 2 midnights.  SignedWelford Roche, MD 11/11/2017, 9:27 PM  Pager: [phone]

## 2017-11-11 NOTE — ED Notes (Signed)
Admitting at bedside 

## 2017-11-11 NOTE — Progress Notes (Signed)
Report received from ED. Pt arrived at the unit at 1735 via bed.

## 2017-11-11 NOTE — ED Notes (Signed)
Patient transported to CT 

## 2017-11-11 NOTE — ED Triage Notes (Signed)
Pt presents from home with two days of confusion, fevers.  Patient's wife states she left him yesterday morning at 8am, and when she returned several hours later he was sitting on the same spiot on the toilet.  Denies pain.

## 2017-11-11 NOTE — Progress Notes (Signed)
Pharmacy Antibiotic Note Cody Silva is a 72 y.o. male admitted on 11/11/2017 with foot wound. Pharmacy has been consulted for cefepime and vancomycin dosing.  Plan: 1. Cefepime 2 grams IV every 24 hours 2. Vancomycin 1250 mg IV every 24 hours  Height: 6\' 1"  (185.4 cm) Weight: 184 lb 12.8 oz (83.8 kg) IBW/kg (Calculated) : 79.9  Temp (24hrs), Avg:103 F (39.4 C), Min:100.2 F (37.9 C), Max:104.8 F (40.4 C)  Recent Labs  Lab 11/11/17 1239 11/11/17 1303  WBC 6.9  --   CREATININE 2.45*  --   LATICACIDVEN  --  1.57    Estimated Creatinine Clearance: 31.7 mL/min (A) (by C-G formula based on SCr of 2.45 mg/dL (H)).    Allergies  Allergen Reactions  . Metformin And Related Diarrhea  . Tape Rash    Thank you for allowing pharmacy to be a part of this patient's care.  Vincenza Hews, PharmD, BCPS 11/11/2017, 7:57 PM

## 2017-11-11 NOTE — Progress Notes (Signed)
ANTIBIOTIC CONSULT NOTE - INITIAL  Pharmacy Consult for Vanco/Zosyn Indication: sepsis  Allergies  Allergen Reactions  . Metformin And Related Diarrhea  . Tape Rash    Patient Measurements:   Adjusted Body Weight:  Vital Signs: Temp: 104.8 F (40.4 C) (03/10 1222) Temp Source: Rectal (03/10 1222) BP: 231/126 (03/10 1222) Pulse Rate: 124 (03/10 1222) Intake/Output from previous day: No intake/output data recorded. Intake/Output from this shift: No intake/output data recorded.  Labs: No results for input(s): WBC, HGB, PLT, LABCREA, CREATININE in the last 72 hours. CrCl cannot be calculated (Patient's most recent lab result is older than the maximum 21 days allowed.). No results for input(s): VANCOTROUGH, VANCOPEAK, VANCORANDOM, GENTTROUGH, GENTPEAK, GENTRANDOM, TOBRATROUGH, TOBRAPEAK, TOBRARND, AMIKACINPEAK, AMIKACINTROU, AMIKACIN in the last 72 hours.   Microbiology: No results found for this or any previous visit (from the past 720 hour(s)).  Medical History: Past Medical History:  Diagnosis Date  . Anemia   . Arthritis    "right arm, right ankle, right side" (10/30/2014)  . Colon polyps   . Diabetic neuropathy ([place])   . Diabetic retinopathy ([place])   . H/O agent Orange exposure   . Headache    "@ least 3 times/wk" (10/30/2014)  . History of blood transfusion 10/30/2014   hematochezia  . History of gout   . Hypertension   . OSA on CPAP    "suppose to wear mask; I've got a call in for an equipment change" (10/30/2014)  . PTSD (post-traumatic stress disorder)    "service related"  . Seizures ([place])   . Stroke [facility]) 2014   left extremity deficits; facial left  . 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 WBC 4, estimated CrCl 40-50.  ED RN estimates 220lb.  Goal of Therapy:  Vancomycin trough level 15-20 mcg/ml  Plan:  Zosyn 3.375g IV q 8hrs. Vancomycin 1g IV x 1 in ED, then 750mg  IV q 12hr Vanco trough after 3-5 doses at steady state. Order for height/weight assessment.  Charlynn Salih S. Alford Highland, PharmD, BCPS Clinical Staff Pharmacist Pager [phone]  Eilene Ghazi Stillinger 11/11/2017,12:57 PM

## 2017-11-11 NOTE — Progress Notes (Signed)
Pt systolic blood pressure is above 200, paged the attending MD to get order for a bp med. Awaiting response from MD.

## 2017-11-11 NOTE — ED Notes (Signed)
Attempted report x1. 

## 2017-11-11 NOTE — Progress Notes (Addendum)
Cody Silva is a 72 y.o. male patient admitted from ED awake, alert - oriented  X 4 - no acute distress noted.  VSS - Blood pressure (!) 201/92, pulse (!) 101, temperature 100.2 F (37.9 C), temperature source Oral, resp. rate 20, height 6\' 1"  (1.854 m), weight 83.8 kg (184 lb 12.8 oz), SpO2 100 %.    IV in place, occlusive dsg intact without redness.  Orientation to room, and floor completed with information packet given to patient/family.  Patient declined safety video at this time.  Admission INP armband ID verified with patient/family, and in place.   SR up x 2, fall assessment complete, with patient and family able to verbalize understanding of risk associated with falls, and verbalized understanding to call nsg before up out of bed.  Call light within reach, patient able to voice, and demonstrate understanding.  Skin, clean-dry- intact without evidence of bruising, or skin tears.  Calluses noted on the right heel, and sole of the foot. No evidence of skin break down noted on exam.     Will cont to eval and treat per MD orders.  [name], RN 11/11/2017 7:30 PM

## 2017-11-11 NOTE — ED Notes (Signed)
Patient had clonodine patch on arm which fell off with turning

## 2017-11-12 ENCOUNTER — Inpatient Hospital Stay (HOSPITAL_COMMUNITY): Payer: Medicare Other

## 2017-11-12 DIAGNOSIS — R6520 Severe sepsis without septic shock: Secondary | ICD-10-CM | POA: Diagnosis present

## 2017-11-12 DIAGNOSIS — T368X5A Adverse effect of other systemic antibiotics, initial encounter: Secondary | ICD-10-CM | POA: Diagnosis present

## 2017-11-12 DIAGNOSIS — E11319 Type 2 diabetes mellitus with unspecified diabetic retinopathy without macular edema: Secondary | ICD-10-CM

## 2017-11-12 DIAGNOSIS — F05 Delirium due to known physiological condition: Secondary | ICD-10-CM | POA: Diagnosis present

## 2017-11-12 DIAGNOSIS — Z7902 Long term (current) use of antithrombotics/antiplatelets: Secondary | ICD-10-CM | POA: Diagnosis not present

## 2017-11-12 DIAGNOSIS — Z91048 Other nonmedicinal substance allergy status: Secondary | ICD-10-CM

## 2017-11-12 DIAGNOSIS — N183 Chronic kidney disease, stage 3 (moderate): Secondary | ICD-10-CM

## 2017-11-12 DIAGNOSIS — I69351 Hemiplegia and hemiparesis following cerebral infarction affecting right dominant side: Secondary | ICD-10-CM

## 2017-11-12 DIAGNOSIS — N141 Nephropathy induced by other drugs, medicaments and biological substances: Secondary | ICD-10-CM | POA: Diagnosis present

## 2017-11-12 DIAGNOSIS — Z79899 Other long term (current) drug therapy: Secondary | ICD-10-CM | POA: Diagnosis not present

## 2017-11-12 DIAGNOSIS — E11621 Type 2 diabetes mellitus with foot ulcer: Secondary | ICD-10-CM

## 2017-11-12 DIAGNOSIS — F0390 Unspecified dementia without behavioral disturbance: Secondary | ICD-10-CM | POA: Diagnosis not present

## 2017-11-12 DIAGNOSIS — N400 Enlarged prostate without lower urinary tract symptoms: Secondary | ICD-10-CM | POA: Diagnosis present

## 2017-11-12 DIAGNOSIS — N503 Cyst of epididymis: Secondary | ICD-10-CM

## 2017-11-12 DIAGNOSIS — L0889 Other specified local infections of the skin and subcutaneous tissue: Secondary | ICD-10-CM | POA: Diagnosis not present

## 2017-11-12 DIAGNOSIS — N5082 Scrotal pain: Secondary | ICD-10-CM | POA: Diagnosis not present

## 2017-11-12 DIAGNOSIS — Z833 Family history of diabetes mellitus: Secondary | ICD-10-CM

## 2017-11-12 DIAGNOSIS — N179 Acute kidney failure, unspecified: Secondary | ICD-10-CM | POA: Diagnosis present

## 2017-11-12 DIAGNOSIS — A400 Sepsis due to streptococcus, group A: Secondary | ICD-10-CM | POA: Diagnosis not present

## 2017-11-12 DIAGNOSIS — R066 Hiccough: Secondary | ICD-10-CM | POA: Diagnosis not present

## 2017-11-12 DIAGNOSIS — N433 Hydrocele, unspecified: Secondary | ICD-10-CM

## 2017-11-12 DIAGNOSIS — H548 Legal blindness, as defined in USA: Secondary | ICD-10-CM

## 2017-11-12 DIAGNOSIS — A409 Streptococcal sepsis, unspecified: Secondary | ICD-10-CM | POA: Diagnosis present

## 2017-11-12 DIAGNOSIS — Z823 Family history of stroke: Secondary | ICD-10-CM

## 2017-11-12 DIAGNOSIS — N5089 Other specified disorders of the male genital organs: Secondary | ICD-10-CM | POA: Diagnosis not present

## 2017-11-12 DIAGNOSIS — B954 Other streptococcus as the cause of diseases classified elsewhere: Secondary | ICD-10-CM | POA: Diagnosis not present

## 2017-11-12 DIAGNOSIS — E1140 Type 2 diabetes mellitus with diabetic neuropathy, unspecified: Secondary | ICD-10-CM | POA: Diagnosis present

## 2017-11-12 DIAGNOSIS — D696 Thrombocytopenia, unspecified: Secondary | ICD-10-CM | POA: Diagnosis present

## 2017-11-12 DIAGNOSIS — E1122 Type 2 diabetes mellitus with diabetic chronic kidney disease: Secondary | ICD-10-CM | POA: Diagnosis present

## 2017-11-12 DIAGNOSIS — F329 Major depressive disorder, single episode, unspecified: Secondary | ICD-10-CM | POA: Diagnosis present

## 2017-11-12 DIAGNOSIS — I129 Hypertensive chronic kidney disease with stage 1 through stage 4 chronic kidney disease, or unspecified chronic kidney disease: Secondary | ICD-10-CM | POA: Diagnosis present

## 2017-11-12 DIAGNOSIS — R197 Diarrhea, unspecified: Secondary | ICD-10-CM | POA: Diagnosis present

## 2017-11-12 DIAGNOSIS — I639 Cerebral infarction, unspecified: Secondary | ICD-10-CM | POA: Diagnosis not present

## 2017-11-12 DIAGNOSIS — R29810 Facial weakness: Secondary | ICD-10-CM | POA: Diagnosis present

## 2017-11-12 DIAGNOSIS — A419 Sepsis, unspecified organism: Secondary | ICD-10-CM | POA: Diagnosis present

## 2017-11-12 DIAGNOSIS — L97519 Non-pressure chronic ulcer of other part of right foot with unspecified severity: Secondary | ICD-10-CM | POA: Diagnosis present

## 2017-11-12 DIAGNOSIS — N4341 Spermatocele of epididymis, single: Secondary | ICD-10-CM | POA: Diagnosis present

## 2017-11-12 DIAGNOSIS — L84 Corns and callosities: Secondary | ICD-10-CM | POA: Diagnosis present

## 2017-11-12 DIAGNOSIS — R4182 Altered mental status, unspecified: Secondary | ICD-10-CM | POA: Diagnosis present

## 2017-11-12 DIAGNOSIS — R7881 Bacteremia: Secondary | ICD-10-CM | POA: Diagnosis not present

## 2017-11-12 DIAGNOSIS — D649 Anemia, unspecified: Secondary | ICD-10-CM | POA: Diagnosis present

## 2017-11-12 DIAGNOSIS — Z888 Allergy status to other drugs, medicaments and biological substances status: Secondary | ICD-10-CM

## 2017-11-12 LAB — BLOOD CULTURE ID PANEL (REFLEXED)
ACINETOBACTER BAUMANNII: NOT DETECTED
Candida albicans: NOT DETECTED
Candida glabrata: NOT DETECTED
Candida krusei: NOT DETECTED
Candida parapsilosis: NOT DETECTED
Candida tropicalis: NOT DETECTED
ENTEROBACTERIACEAE SPECIES: NOT DETECTED
ENTEROCOCCUS SPECIES: NOT DETECTED
Enterobacter cloacae complex: NOT DETECTED
Escherichia coli: NOT DETECTED
HAEMOPHILUS INFLUENZAE: NOT DETECTED
Klebsiella oxytoca: NOT DETECTED
Klebsiella pneumoniae: NOT DETECTED
LISTERIA MONOCYTOGENES: NOT DETECTED
NEISSERIA MENINGITIDIS: NOT DETECTED
PSEUDOMONAS AERUGINOSA: NOT DETECTED
Proteus species: NOT DETECTED
STREPTOCOCCUS AGALACTIAE: NOT DETECTED
STREPTOCOCCUS PYOGENES: DETECTED — AB
STREPTOCOCCUS SPECIES: DETECTED — AB
Serratia marcescens: NOT DETECTED
Staphylococcus aureus (BCID): NOT DETECTED
Staphylococcus species: NOT DETECTED
Streptococcus pneumoniae: NOT DETECTED

## 2017-11-12 LAB — CBC
HEMATOCRIT: 30 % — AB (ref 39.0–52.0)
HEMOGLOBIN: 9.7 g/dL — AB (ref 13.0–17.0)
MCH: 28.7 pg (ref 26.0–34.0)
MCHC: 32.3 g/dL (ref 30.0–36.0)
MCV: 88.8 fL (ref 78.0–100.0)
PLATELETS: 80 10*3/uL — AB (ref 150–400)
RBC: 3.38 MIL/uL — AB (ref 4.22–5.81)
RDW: 13.2 % (ref 11.5–15.5)
WBC: 4.3 10*3/uL (ref 4.0–10.5)

## 2017-11-12 LAB — GLUCOSE, CAPILLARY
GLUCOSE-CAPILLARY: 106 mg/dL — AB (ref 65–99)
GLUCOSE-CAPILLARY: 136 mg/dL — AB (ref 65–99)
Glucose-Capillary: 122 mg/dL — ABNORMAL HIGH (ref 65–99)
Glucose-Capillary: 123 mg/dL — ABNORMAL HIGH (ref 65–99)
Glucose-Capillary: 133 mg/dL — ABNORMAL HIGH (ref 65–99)

## 2017-11-12 LAB — BASIC METABOLIC PANEL
ANION GAP: 10 (ref 5–15)
BUN: 28 mg/dL — ABNORMAL HIGH (ref 6–20)
CALCIUM: 8.4 mg/dL — AB (ref 8.9–10.3)
CHLORIDE: 105 mmol/L (ref 101–111)
CO2: 21 mmol/L — ABNORMAL LOW (ref 22–32)
Creatinine, Ser: 2.55 mg/dL — ABNORMAL HIGH (ref 0.61–1.24)
GFR calc Af Amer: 28 mL/min — ABNORMAL LOW (ref >60)
GFR calc non Af Amer: 24 mL/min — ABNORMAL LOW (ref >60)
GLUCOSE: 119 mg/dL — AB (ref 65–99)
Potassium: 3.1 mmol/L — ABNORMAL LOW (ref 3.5–5.1)
Sodium: 136 mmol/L (ref 135–145)

## 2017-11-12 MED ORDER — POTASSIUM CHLORIDE CRYS ER 20 MEQ PO TBCR
40.0000 meq | EXTENDED_RELEASE_TABLET | Freq: Once | ORAL | Status: AC
  Administered 2017-11-12: 40 meq via ORAL
  Filled 2017-11-12: qty 2

## 2017-11-12 MED ORDER — LOPERAMIDE HCL 2 MG PO CAPS
2.0000 mg | ORAL_CAPSULE | Freq: Once | ORAL | Status: AC
  Administered 2017-11-12: 2 mg via ORAL
  Filled 2017-11-12: qty 1

## 2017-11-12 MED ORDER — ACETAMINOPHEN 325 MG PO TABS
650.0000 mg | ORAL_TABLET | Freq: Four times a day (QID) | ORAL | Status: DC | PRN
  Administered 2017-11-12 (×2): 650 mg via ORAL
  Filled 2017-11-12 (×2): qty 2

## 2017-11-12 MED ORDER — PENICILLIN G POT IN DEXTROSE 60000 UNIT/ML IV SOLN
3.0000 10*6.[IU] | INTRAVENOUS | Status: DC
  Filled 2017-11-12: qty 50

## 2017-11-12 MED ORDER — PENICILLIN G POTASSIUM 5000000 UNITS IJ SOLR
3.0000 10*6.[IU] | INTRAVENOUS | Status: DC
  Administered 2017-11-12 – 2017-11-18 (×34): 3 10*6.[IU] via INTRAVENOUS
  Filled 2017-11-12 (×38): qty 3

## 2017-11-12 MED ORDER — DEXTROSE 5 % IV SOLN: 4.0000 10*6.[IU] | Freq: Three times a day (TID) | INTRAVENOUS | Status: DC

## 2017-11-12 NOTE — Progress Notes (Signed)
PHARMACY - PHYSICIAN COMMUNICATION CRITICAL VALUE ALERT - BLOOD CULTURE IDENTIFICATION (BCID)  Cody Silva is an 72 y.o. male who presented to [facility] on 11/11/2017  Assessment: 77 male with confusion and fevers. Originally started on empiric antibiotics for sepsis, now with 2/2 blood cultures positive for Group A strep.  Name of physician (or Provider) Contacted: Internal Medicine night coverage  Current antibiotics: vancomycin/cefepime  Changes to prescribed antibiotics recommended:  Recommendations declined by provider due to multiple possible sources of infection  Results for orders placed or performed during the hospital encounter of 11/11/17  Blood Culture ID Panel (Reflexed) (Collected: 11/11/2017  1:24 PM)  Result Value Ref Range   Enterococcus species NOT DETECTED NOT DETECTED   Listeria monocytogenes NOT DETECTED NOT DETECTED   Staphylococcus species NOT DETECTED NOT DETECTED   Staphylococcus aureus NOT DETECTED NOT DETECTED   Streptococcus species DETECTED (A) NOT DETECTED   Streptococcus agalactiae NOT DETECTED NOT DETECTED   Streptococcus pneumoniae NOT DETECTED NOT DETECTED   Streptococcus pyogenes DETECTED (A) NOT DETECTED   Acinetobacter baumannii NOT DETECTED NOT DETECTED   Enterobacteriaceae species NOT DETECTED NOT DETECTED   Enterobacter cloacae complex NOT DETECTED NOT DETECTED   Escherichia coli NOT DETECTED NOT DETECTED   Klebsiella oxytoca NOT DETECTED NOT DETECTED   Klebsiella pneumoniae NOT DETECTED NOT DETECTED   Proteus species NOT DETECTED NOT DETECTED   Serratia marcescens NOT DETECTED NOT DETECTED   Haemophilus influenzae NOT DETECTED NOT DETECTED   Neisseria meningitidis NOT DETECTED NOT DETECTED   Pseudomonas aeruginosa NOT DETECTED NOT DETECTED   Candida albicans NOT DETECTED NOT DETECTED   Candida glabrata NOT DETECTED NOT DETECTED   Candida krusei NOT DETECTED NOT DETECTED   Candida parapsilosis NOT DETECTED NOT DETECTED   Candida  tropicalis NOT DETECTED NOT DETECTED    Harvel Quale 11/12/2017  3:55 AM

## 2017-11-12 NOTE — Progress Notes (Signed)
Subjective:  Patient febrile overnight with T-max 103.1.  He was otherwise hemodynamically stable.  This morning he reports feeling better.  Continues to complain of right foot discomfort/pain. Family present at bedside and state his mental status appears to be at baseline.  Discussed patient's diagnosis with family and explained ongoing IV therapy for bacteremia.  Also discuss imaging studies to assess for possible osteomyelitis of right foot as well as scrotal ultrasound.  All questions answered.  Objective:  Vital signs in last 24 hours: Vitals:   11/12/17 0500 11/12/17 0612 11/12/17 0848 11/12/17 1343  BP: (!) 159/76   (!) 177/83  Pulse: 91  71 78  Resp: 18  18 20   Temp: (!) 103.1 F (39.5 C) (!) 102 F (38.9 C)  (!) 100.9 F (38.3 C)  TempSrc: Oral Oral  Oral  SpO2: 99%  100% 100%  Weight:      Height:       Physical Exam  Constitutional:  Elderly male that appears younger than stated age lying in bed in no acute distress.  Cardiovascular: Normal rate, regular rhythm and normal heart sounds. Exam reveals no gallop and no friction rub.  No murmur heard. Pulmonary/Chest: Effort normal and breath sounds normal. No respiratory distress. He has no wheezes. He has no rales.  Abdominal: Soft. Bowel sounds are normal. He exhibits no distension. There is no tenderness.  Musculoskeletal:  There is trace edema on bilateral lower extremities R>L  Skin:  Callus on lateral aspect of R foot and linear wound at base of toes on plantar surface with associated erythema and edema (no significant change from yesterday)    Assessment/Plan:  Principal Problem:   Sepsis ([place]) Active Problems:   Diabetes mellitus ([place])   Hypertension   History of CVA (cerebrovascular accident)   Depression   AKI (acute kidney injury) ([place])   CKD stage 3 due to type 2 diabetes mellitus ([place])   Acute sepsis ([place])   Scrotal pain  Cody Silva is a 72 yo male with PMH of Z6XW complicated by diabetic  retinopathy, CKD, and 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  # GAS Bacteremia: Blood cx positive for Streptococcus pyogenes and patient was started on IV penicillin G.  Cefepime and vancomycin discontinued.  She remains febrile, but fever curve trending down.  He is otherwise hemodynamically stable. WBC trending down. Source of bacteremia likely right foot.  Right foot MRI without evidence suggestive of acute osteomyelitis.  We will continue antibiotic therapy as below. - IV Penicillin G q4h - Day 1 - Discontinue Vancomycin and Cefepime  - IVF hydration with NS @ 100 cc/hr  - On telemetry   # Scrotal pain: Per wife, patient complaining of scrotal pain 2 days ago while he was being bathed.  No skin injury noted on exam today, but patient very tender on palpation of bilateral testicles.  Scrotal ultrasound ordered which showed left testicular microlithiasis, small cyst in the left epididymis, and bilateral hydroceles.  No vasculature abnormalities noted. - Will continue to monitor   # HTN:  - Continue home Coreg 25 mg BID - Continue home clonidine patch 0.2mg   - Holding home chlorthalidone in the setting of AKI   # T2DM: Patient was insulin dependent in the past. Per wife, he lost 30 lbs in 3 months in the recent past. He was worked up for this at the [place] with no etiology found.  Since then, his DM has been well controlled and he has not required insulin. His last A1c is 5.5, unclear when this was obtained. CBGs at goal.  - SSI-S  - HS scale  - CBG monitoring  - Vascular ABIs  # AKI on CKD: Per chart patient has a history of CKD secondary to T2DM. Cr 2.45 on admission. His baseline renal function appears to be Cr ~2.0-2.1. Renal function slightly worse today with CR 2.55.  Likely nephrotoxicity from vancomycin and cefepime which have been discontinued. - Monitoring electrolytes  - Avoid nephrotoxic agents  - BMP in  AM  # History of CVA: Multiple CVAs in the past. Most recent CVA in 08/2017 in residual R-sided deficits. Follows up with [facility].  - Continue home Plavix 75 mg QD  - Continue home atorvastatin 40 mg QD   # ?Depression: Patient med list medication includes Wellbutrin and Zoloft.  - Continue home Bupropion 75 mg QD - Continue home Zoloft 100 mg QD   # ?BPH: - Continue home Tamsulosin 0.4 mg QD     Dispo: Anticipated discharge in approximately 2-3 day(s) pending improvement in clinical status and completion of worjup.   Welford Roche, MD 11/12/2017, 7:03 PM [phone]

## 2017-11-12 NOTE — Progress Notes (Signed)
Notified MD on call that pt's temp is 103.1. Anderson Malta stated she will put in order for po medication for fever. Will continue to monitor pt. Ranelle Oyster, RN

## 2017-11-12 NOTE — Evaluation (Signed)
Clinical/Bedside Swallow Evaluation Patient Details  Name: Cody Silva MRN: [id number] Date of Birth: 1946/05/04  Today's Date: 11/12/2017 Time: SLP Start Time (ACUTE ONLY): 0940 SLP Stop Time (ACUTE ONLY): 1000 SLP Time Calculation (min) (ACUTE ONLY): 20 min  Past Medical History:  Past Medical History:  Diagnosis Date  . Anemia   . Arthritis    "right arm, right ankle, right side" (10/30/2014)  . Colon polyps   . Diabetic neuropathy ([place])   . Diabetic retinopathy ([place])   . H/O agent Orange exposure   . Headache    "@ least 3 times/wk" (10/30/2014)  . History of blood transfusion 10/30/2014   hematochezia  . History of gout   . Hypertension   . OSA on CPAP    "suppose to wear mask; I've got a call in for an equipment change" (10/30/2014)  . PTSD (post-traumatic stress disorder)    "service related"  . Seizures ([place])   . Stroke [facility]) 2014   left extremity deficits; facial left  . Type II diabetes mellitus ([place])    Past Surgical History:  Past Surgical History:  Procedure Laterality Date  . BONE GRAFT HIP ILIAC CREST Left ~ 1976  . CATARACT EXTRACTION W/ INTRAOCULAR LENS  IMPLANT, BILATERAL Bilateral 2014-2015  . EP IMPLANTABLE DEVICE N/A 12/24/2015   Procedure: Loop Recorder Insertion;  Surgeon: Thompson Grayer, MD;  Location: [place] [facility];  Service: Cardiovascular;  Laterality: N/A;  . TUMOR REMOVAL Right ~ 1976   "arm; had to take bone left hip to add to the repair"   HPI:  Cody Silva is a 72 yo male with PMH of N2DP complicated by diabetic retinopathy, CKD, and CVA on 08/2017 with residual R sided deficits who was brought in by spouse due to altered behavior. Patient is a poor historian and no family present in the room when seen. His only complaint if R foot pain. He is unable to provide information regarding onset of symptoms or recent history of trauma. He endorses chills and feeling cold. Otherwise has a pan-negative ROS including HA, dizziness, changes  in vision, chest pain, SOB, cough, abdominal pain, N/V, changes in bowel movements, urinary symptoms and LE swelling. He is a [place] patient and records not available to review. She reports she noticed a change in behavior on 3/8 but patient did not voice any complaints. On 3/9, patient was found seating in the toilet confused. When he stood up he was unable to walk and he had to be instructed to put one foot in front of another. Behavior continued to worsened throughout the day and he started to get lost around the house. His wife felt he was warm to the touch and measured his temperature which was 101. His only recent complaint is scrotal soreness. He was brought in to the hospital by family due to concern for stroke as he had a similar presentation in 08/2017. 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 At baseline, he ambulated with a walker and is able to perform is ADLs independently. He is fully alert and oriented at baseline. Head CT revealed No acute intracranial abnormalities, mild chronic microvascular ischemic change, small basal ganglia lacune infarcts with overall stable appearance from the prior head CT. Chest x-ray didn't reveal any cardiopulmonary disease.    Assessment / Plan / Recommendation Clinical Impression  Pt presents with reduced risk of aspiration when following strict aspiration precautions. Pt requires assist when eating d/t visual deficits but is alert and able  to demonstrate functional mastication with breakfast tray this morning. Pt with no evidence of fatigue during consumption and no overt s/s of aspiration. Recommend pt continue regular diet with ST to sign off and if any further symptoms arise, ST can be re-consulted.  SLP Visit Diagnosis: Dysphagia, unspecified (R13.10)    Aspiration Risk  Mild aspiration risk    Diet Recommendation Regular;Thin liquid   Liquid Administration via: Straw;Cup Medication Administration: Whole meds with liquid Supervision: Staff  to assist with self feeding Compensations: Minimize environmental distractions;Slow rate;Small sips/bites Postural Changes: Seated upright at 90 degrees    Other  Recommendations Oral Care Recommendations: Oral care BID   Follow up Recommendations None        Swallow Study   General Date of Onset: 11/11/17 HPI: Cody Silva is a 72 yo male with PMH of W9QP complicated by diabetic retinopathy, CKD, and CVA on 08/2017 with residual R sided deficits who was brought in by spouse due to altered behavior. Patient is a poor historian and no family present in the room when seen. His only complaint if R foot pain. He is unable to provide information regarding onset of symptoms or recent history of trauma. He endorses chills and feeling cold. Otherwise has a pan-negative ROS including HA, dizziness, changes in vision, chest pain, SOB, cough, abdominal pain, N/V, changes in bowel movements, urinary symptoms and LE swelling. He is a [place] patient and records not available to review. She reports she noticed a change in behavior on 3/8 but patient did not voice any complaints. On 3/9, patient was found seating in the toilet confused. When he stood up he was unable to walk and he had to be instructed to put one foot in front of another. Behavior continued to worsened throughout the day and he started to get lost around the house. His wife felt he was warm to the touch and measured his temperature which was 101. His only recent complaint is scrotal soreness. He was brought in to the hospital by family due to concern for stroke as he had a similar presentation in 08/2017. 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 At baseline, he ambulated with a walker and is able to perform is ADLs independently. He is fully alert and oriented at baseline. Head CT revealed No acute intracranial abnormalities, mild chronic microvascular ischemic change, small basal ganglia lacune infarcts with overall stable appearance  from the prior head CT. Chest x-ray didn't reveal any cardiopulmonary disease.  Type of Study: Bedside Swallow Evaluation Previous Swallow Assessment: BSE 01/19/17 - recommend dysphagia 3 with thin liquids Diet Prior to this Study: Regular;Thin liquids Temperature Spikes Noted: Yes Respiratory Status: Room air History of Recent Intubation: No Behavior/Cognition: Alert;Cooperative;Pleasant mood Oral Cavity Assessment: Within Functional Limits Oral Care Completed by SLP: No Oral Cavity - Dentition: Adequate natural dentition Vision: Impaired for self-feeding Self-Feeding Abilities: Needs assist Patient Positioning: Upright in bed Baseline Vocal Quality: Normal Volitional Cough: Strong Volitional Swallow: Able to elicit    Oral/Motor/Sensory Function Overall Oral Motor/Sensory Function: Generalized oral weakness   Ice Chips Ice chips: Within functional limits Presentation: Spoon   Thin Liquid Thin Liquid: Within functional limits Presentation: Cup;Spoon    Nectar Thick Nectar Thick Liquid: Not tested   Honey Thick Honey Thick Liquid: Not tested   Puree Puree: Within functional limits Presentation: Spoon   Solid   GO   Solid: Within functional limits    Functional Assessment Tool Used: skilled clinical observations Functional Limitations: Swallowing Swallow  Current Status [phone]): At least 1 percent but less than 20 percent impaired, limited or restricted Swallow Goal Status [phone]): At least 1 percent but less than 20 percent impaired, limited or restricted Swallow Discharge Status [phone]): At least 1 percent but less than 20 percent impaired, limited or restricted   Cody Silva 11/12/2017,10:04 AM

## 2017-11-12 NOTE — Progress Notes (Signed)
Subjective: Mr. Cody Silva is a 72 y.o. Male with past medical history of T2DM, HTN, and dementia who presented with fever (104F), tachycardia, tachypnea, and foot callous with wound. Patient reports feeling well overnight with no acute distress or concerns. He reports no pain or discomfort with his R foot wound. He does report diarrhea when he is unable to take his medications with food. He does endorse testicular discomfort and swelling, reporting that he has felt extremely tender in that region. The team reiterated the updated antibiotics plan per overnight culture result with the patient and family who all confirmed understanding.   Objective: Vital signs in last 24 hours: Vitals:   11/11/17 2210 11/12/17 0500 11/12/17 0612 11/12/17 0848  BP:  (!) 159/76    Pulse:  91  71  Resp:  18  18  Temp:  (!) 103.1 F (39.5 C) (!) 102 F (38.9 C)   TempSrc:  Oral Oral   SpO2: 98% 99%  100%  Weight:      Height:       Weight change:   Intake/Output Summary (Last 24 hours) at 11/12/2017 1346 Last data filed at 11/12/2017 0618 Gross per 24 hour  Intake 1011.67 ml  Output 325 ml  Net 686.67 ml   BP (!) 177/83 (BP Location: Right Arm) Comment: rn notified  Pulse 78   Temp (!) 100.9 F (38.3 C) (Oral) Comment: rn notified  Resp 20   Ht '6\' 1"'$  (1.854 m)   Wt 184 lb 12.8 oz (83.8 kg)   SpO2 100%   BMI 24.38 kg/m   General Appearance:    Alert, cooperative, no distress, appears stated age. Lying comfortably in hospital bed in no acute distress.   Head:    Normocephalic, without obvious abnormality, atraumatic  Eyes:    PERRL, conjunctiva/corneas clear.       Neck:   Supple, symmetrical, trachea midline, no adenopathy;       thyroid:  No enlargement/tenderness/nodules; no carotid   bruit or JVD  Lungs:     Clear to auscultation bilaterally, respirations unlabored  Chest wall:    No tenderness or deformity  Heart:    Regular rate and rhythm, S1 and S2 normal, no murmur, rub   or  gallop  Abdomen:     Soft, non-tender, bowel sounds active all four quadrants,    no masses, no organomegaly  Genitalia:    Extreme tenderness on mild touch and movement of testicles. No skin abrasions or wounds noted. No lesion or discharge.  Extremities:   R foot healed wound appreciated with dried blood and scabbing. Linear wound at base of toes on plantar surface of R foot with mild erythema. No edema, erythema, or purulent discharge appreciated.  Pulses:   2+ and symmetric all extremities  Neurologic:   Oriented to person, place, and time. Improved from 03/10  Skin:   Skin color, texture, turgor normal, no rashes or lesions   Lab Results: Recent Results  CBC WITH DIFFERENTIAL     Status: Abnormal   Collection Time: 11/11/17 12:39 PM  Result Value Ref Range   WBC 6.9 4.0 - 10.5 K/uL   RBC 4.03 (L) 4.22 - 5.81 MIL/uL   Hemoglobin 12.1 (L) 13.0 - 17.0 g/dL   HCT 36.0 (L) 39.0 - 52.0 %   MCV 89.3 78.0 - 100.0 fL   MCH 30.0 26.0 - 34.0 pg   MCHC 33.6 30.0 - 36.0 g/dL   RDW 13.2 11.5 - 15.5 %  Platelets 104 (L) 150 - 400 K/uL    Comment: REPEATED TO VERIFY SPECIMEN CHECKED FOR CLOTS PLATELET COUNT CONFIRMED BY SMEAR    Neutrophils Relative % 89 %   Neutro Abs 6.2 1.7 - 7.7 K/uL   Lymphocytes Relative 8 %   Lymphs Abs 0.5 (L) 0.7 - 4.0 K/uL   Monocytes Relative 3 %   Monocytes Absolute 0.2 0.1 - 1.0 K/uL   Eosinophils Relative 0 %   Eosinophils Absolute 0.0 0.0 - 0.7 K/uL   Basophils Relative 0 %   Basophils Absolute 0.0 0.0 - 0.1 K/uL    Comment: Performed at [facility] [address], [place], [place] [address]  Urinalysis, Routine w reflex microscopic     Status: Abnormal   Collection Time: 11/11/17 12:47 PM  Result Value Ref Range   Color, Urine YELLOW YELLOW   APPearance CLEAR CLEAR   Specific Gravity, Urine 1.011 1.005 - 1.030   pH 5.0 5.0 - 8.0   Glucose, UA NEGATIVE NEGATIVE mg/dL   Hgb urine dipstick LARGE (A) NEGATIVE   Bilirubin Urine NEGATIVE  NEGATIVE   Ketones, ur 5 (A) NEGATIVE mg/dL   Protein, ur >=300 (A) NEGATIVE mg/dL   Nitrite NEGATIVE NEGATIVE   Leukocytes, UA NEGATIVE NEGATIVE   RBC / HPF 6-30 0 - 5 RBC/hpf   WBC, UA 0-5 0 - 5 WBC/hpf   Bacteria, UA RARE (A) NONE SEEN   Squamous Epithelial / LPF NONE SEEN NONE SEEN   Hyaline Casts, UA PRESENT    Granular Casts, UA PRESENT    Amorphous Crystal PRESENT     Comment: Performed at [facility], [address] [place], [place] [address]  I-Stat CG4 Lactic Acid, ED  (not at  [facility])     Status: None   Collection Time: 11/11/17  1:03 PM  Result Value Ref Range   Lactic Acid, Venous 1.57 0.5 - 1.9 mmol/L  Blood Culture (routine x 2)     Status: None (Preliminary result)   Collection Time: 11/11/17  1:04 PM  Result Value Ref Range   Specimen Description BLOOD LEFT ANTECUBITAL    Special Requests      BOTTLES DRAWN AEROBIC AND ANAEROBIC Blood Culture adequate volume   Culture  Setup Time      GRAM POSITIVE COCCI IN CHAINS IN BOTH AEROBIC AND ANAEROBIC BOTTLES CRITICAL VALUE NOTED.  VALUE IS CONSISTENT WITH PREVIOUSLY REPORTED AND CALLED VALUE.    Culture      NO GROWTH < 24 HOURS Performed at [facility], [place] [address] [place], [place] [address]    Report Status PENDING   Blood Culture (routine x 2)     Status: None (Preliminary result)   Collection Time: 11/11/17  1:24 PM  Result Value Ref Range   Specimen Description BLOOD RIGHT ANTECUBITAL    Special Requests      BOTTLES DRAWN AEROBIC AND ANAEROBIC Blood Culture adequate volume   Culture  Setup Time      GRAM POSITIVE COCCI IN CHAINS IN BOTH AEROBIC AND ANAEROBIC BOTTLES CRITICAL RESULT CALLED TO, READ BACK BY AND VERIFIED WITH: A.MASTERS,PHARMD AT 8469 ON 11/12/17 BY G.MCADOO Performed at [facility], [place] [address], [place],  [address]    Culture GRAM POSITIVE COCCI    Report Status PENDING   Blood Culture ID Panel (Reflexed)     Status: Abnormal   Collection Time: 11/11/17   1:24 PM  Result Value Ref Range  Enterococcus species NOT DETECTED NOT DETECTED   Listeria monocytogenes NOT DETECTED NOT DETECTED   Staphylococcus species NOT DETECTED NOT DETECTED   Staphylococcus aureus NOT DETECTED NOT DETECTED   Streptococcus species DETECTED (A) NOT DETECTED    Comment: CRITICAL RESULT CALLED TO, READ BACK BY AND VERIFIED WITH: A.MASTERS,PHARMD AT 0348 ON 11/12/17 BY G.MCADOO    Streptococcus agalactiae NOT DETECTED NOT DETECTED   Streptococcus pneumoniae NOT DETECTED NOT DETECTED   Streptococcus pyogenes DETECTED (A) NOT DETECTED    Comment: CRITICAL RESULT CALLED TO, READ BACK BY AND VERIFIED WITH: A.MASTERS,PHARMD AT 9735 ON 11/12/17 BY G.MCADOO    Acinetobacter baumannii NOT DETECTED NOT DETECTED   Enterobacteriaceae species NOT DETECTED NOT DETECTED   Enterobacter cloacae complex NOT DETECTED NOT DETECTED   Escherichia coli NOT DETECTED NOT DETECTED   Klebsiella oxytoca NOT DETECTED NOT DETECTED   Klebsiella pneumoniae NOT DETECTED NOT DETECTED   Proteus species NOT DETECTED NOT DETECTED   Serratia marcescens NOT DETECTED NOT DETECTED   Haemophilus influenzae NOT DETECTED NOT DETECTED   Neisseria meningitidis NOT DETECTED NOT DETECTED   Pseudomonas aeruginosa NOT DETECTED NOT DETECTED   Candida albicans NOT DETECTED NOT DETECTED   Candida glabrata NOT DETECTED NOT DETECTED   Candida krusei NOT DETECTED NOT DETECTED   Candida parapsilosis NOT DETECTED NOT DETECTED   Candida tropicalis NOT DETECTED NOT DETECTED    Comment: Performed at [facility] [address] [place], [place] [address]  Influenza panel by PCR (type A & B)     Status: None   Collection Time: 11/11/17  3:31 PM  Result Value Ref Range   Influenza A By PCR NEGATIVE NEGATIVE   Influenza B By PCR NEGATIVE NEGATIVE    Comment: (NOTE) The Xpert Xpress Flu assay is intended as an aid in the diagnosis of  influenza and should not be used as a sole basis for treatment.  This  assay  is FDA approved for nasopharyngeal swab specimens only. Nasal  washings and aspirates are unacceptable for Xpert Xpress Flu testing. Performed at [facility], [place] [address] [place], [place] [address]   Glucose, capillary     Status: Abnormal   Collection Time: 11/11/17  9:05 PM  Result Value Ref Range   Glucose-Capillary 122 (H) 65 - 99 mg/dL  Basic metabolic panel     Status: Abnormal   Collection Time: 11/12/17  7:10 AM  Result Value Ref Range   Sodium 136 135 - 145 mmol/L   Potassium 3.1 (L) 3.5 - 5.1 mmol/L   Chloride 105 101 - 111 mmol/L   CO2 21 (L) 22 - 32 mmol/L   Glucose, Bld 119 (H) 65 - 99 mg/dL   BUN 28 (H) 6 - 20 mg/dL   Creatinine, Ser 2.55 (H) 0.61 - 1.24 mg/dL   Calcium 8.4 (L) 8.9 - 10.3 mg/dL   GFR calc non Af Amer 24 (L) >60 mL/min   GFR calc Af Amer 28 (L) >60 mL/min    Comment: (NOTE) The eGFR has been calculated using the CKD EPI equation. This calculation has not been validated in all clinical situations. eGFR's persistently <60 mL/min signify possible Chronic Kidney Disease.    Anion gap 10 5 - 15    Comment: Performed at [facility] [address] [place],  [address]  CBC     Status: Abnormal   Collection Time: 11/12/17  7:10 AM  Result Value Ref Range   WBC 4.3 4.0 -  10.5 K/uL   RBC 3.38 (L) 4.22 - 5.81 MIL/uL   Hemoglobin 9.7 (L) 13.0 - 17.0 g/dL   HCT 30.0 (L) 39.0 - 52.0 %   MCV 88.8 78.0 - 100.0 fL   MCH 28.7 26.0 - 34.0 pg   MCHC 32.3 30.0 - 36.0 g/dL   RDW 13.2 11.5 - 15.5 %   Platelets 80 (L) 150 - 400 K/uL    Comment: CONSISTENT WITH PREVIOUS RESULT Performed at [facility] [address], [place], [place] [address]   Glucose, capillary     Status: Abnormal   Collection Time: 11/12/17  7:45 AM  Result Value Ref Range   Glucose-Capillary 123 (H) 65 - 99 mg/dL  Glucose, capillary     Status: Abnormal   Collection Time: 11/12/17 11:59 AM  Result Value Ref Range   Glucose-Capillary 106 (H) 65 - 99 mg/dL      Micro Results: Recent Results (from the past 240 hour(s))  Blood Culture (routine x 2)     Status: None (Preliminary result)   Collection Time: 11/11/17  1:04 PM  Result Value Ref Range Status   Specimen Description BLOOD LEFT ANTECUBITAL  Final   Special Requests   Final    BOTTLES DRAWN AEROBIC AND ANAEROBIC Blood Culture adequate volume   Culture  Setup Time   Final    GRAM POSITIVE COCCI IN CHAINS IN BOTH AEROBIC AND ANAEROBIC BOTTLES CRITICAL VALUE NOTED.  VALUE IS CONSISTENT WITH PREVIOUSLY REPORTED AND CALLED VALUE.    Culture   Final    NO GROWTH < 24 HOURS Performed at [facility], [place] [address] [place], [place] [address]    Report Status PENDING  Incomplete  Blood Culture (routine x 2)     Status: None (Preliminary result)   Collection Time: 11/11/17  1:24 PM  Result Value Ref Range Status   Specimen Description BLOOD RIGHT ANTECUBITAL  Final   Special Requests   Final    BOTTLES DRAWN AEROBIC AND ANAEROBIC Blood Culture adequate volume   Culture  Setup Time   Final    GRAM POSITIVE COCCI IN CHAINS IN BOTH AEROBIC AND ANAEROBIC BOTTLES CRITICAL RESULT CALLED TO, READ BACK BY AND VERIFIED WITH: A.MASTERS,PHARMD AT 2440 ON 11/12/17 BY G.MCADOO Performed at [facility], [place] [address] [place], [place] [address]    Culture GRAM POSITIVE COCCI  Final   Report Status PENDING  Incomplete  Blood Culture ID Panel (Reflexed)     Status: Abnormal   Collection Time: 11/11/17  1:24 PM  Result Value Ref Range Status   Enterococcus species NOT DETECTED NOT DETECTED Final   Listeria monocytogenes NOT DETECTED NOT DETECTED Final   Staphylococcus species NOT DETECTED NOT DETECTED Final   Staphylococcus aureus NOT DETECTED NOT DETECTED Final   Streptococcus species DETECTED (A) NOT DETECTED Final    Comment: CRITICAL RESULT CALLED TO, READ BACK BY AND VERIFIED WITH: A.MASTERS,PHARMD AT 0348 ON 11/12/17 BY G.MCADOO    Streptococcus agalactiae NOT DETECTED NOT  DETECTED Final   Streptococcus pneumoniae NOT DETECTED NOT DETECTED Final   Streptococcus pyogenes DETECTED (A) NOT DETECTED Final    Comment: CRITICAL RESULT CALLED TO, READ BACK BY AND VERIFIED WITH: A.MASTERS,PHARMD AT 5366 ON 11/12/17 BY G.MCADOO    Acinetobacter baumannii NOT DETECTED NOT DETECTED Final   Enterobacteriaceae species NOT DETECTED NOT DETECTED Final   Enterobacter cloacae complex NOT DETECTED NOT DETECTED Final   Escherichia coli NOT DETECTED NOT DETECTED Final  Klebsiella oxytoca NOT DETECTED NOT DETECTED Final   Klebsiella pneumoniae NOT DETECTED NOT DETECTED Final   Proteus species NOT DETECTED NOT DETECTED Final   Serratia marcescens NOT DETECTED NOT DETECTED Final   Haemophilus influenzae NOT DETECTED NOT DETECTED Final   Neisseria meningitidis NOT DETECTED NOT DETECTED Final   Pseudomonas aeruginosa NOT DETECTED NOT DETECTED Final   Candida albicans NOT DETECTED NOT DETECTED Final   Candida glabrata NOT DETECTED NOT DETECTED Final   Candida krusei NOT DETECTED NOT DETECTED Final   Candida parapsilosis NOT DETECTED NOT DETECTED Final   Candida tropicalis NOT DETECTED NOT DETECTED Final    Comment: Performed at [facility], [place] [address], [place], [place] [address]   Studies/Results: Dg Chest 2 View  Result Date: 11/11/2017 CLINICAL DATA:  Fevers and altered mental status. EXAM: CHEST - 2 VIEW COMPARISON:  Chest x-ray dated Jan 18, 2017. FINDINGS: Stable mild cardiomegaly. Unchanged loop recorder. Normal pulmonary vascularity. No focal consolidation, pleural effusion, or pneumothorax. No acute osseous abnormality. IMPRESSION: No active cardiopulmonary disease. Electronically Signed   By: Titus Dubin M.D.   On: 11/11/2017 13:23   Ct Head Wo Contrast  Result Date: 11/11/2017 CLINICAL DATA:  Altered level of consciousness. EXAM: CT HEAD WITHOUT CONTRAST TECHNIQUE: Contiguous axial images were obtained from the base of the skull through the vertex without  intravenous contrast. COMPARISON:  01/02/2017 FINDINGS: Brain: No evidence of acute infarction, hemorrhage, hydrocephalus, extra-axial collection or mass lesion/mass effect. Patchy white matter hypoattenuation is noted consistent with mild chronic microvascular ischemic change. Small focus of hypoattenuation near the genu of the right internal capsule and in the left basal ganglia consistent with old lacune infarcts. Vascular: No hyperdense vessel or unexpected calcification. Skull: Normal. Negative for fracture or focal lesion. Sinuses/Orbits: Globes and orbits are unremarkable. Sinuses and mastoid air cells are clear. Other: None. IMPRESSION: 1. No acute intracranial abnormalities. 2. Mild chronic microvascular ischemic change. Small basal ganglia lacune infarcts. Stable appearance from the prior head CT. Electronically Signed   By: Lajean Manes M.D.   On: 11/11/2017 15:09   Dg Foot Complete Right  Result Date: 11/11/2017 CLINICAL DATA:  Foot wound EXAM: RIGHT FOOT COMPLETE - 3+ VIEW COMPARISON:  None. FINDINGS: Hallux valgus deformity is noted. No acute fracture or dislocation is seen. No soft tissue abnormality is noted. No bony erosion is seen. IMPRESSION: No acute abnormality noted. Electronically Signed   By: Inez Catalina M.D.   On: 11/11/2017 19:04   US Scrotum W/doppler  Result Date: 11/12/2017 CLINICAL DATA:  Bilateral testicular tenderness. EXAM: SCROTAL ULTRASOUND DOPPLER ULTRASOUND OF THE TESTICLES TECHNIQUE: Complete ultrasound examination of the testicles, epididymis, and other scrotal structures was performed. Color and spectral Doppler ultrasound were also utilized to evaluate blood flow to the testicles. COMPARISON:  None. FINDINGS: Right testicle Measurements: 2.9 x 1.7 x 2.4 cm. No mass or microlithiasis visualized. Left testicle Measurements: 3.2 x 2.1 x 2.3 cm. Two tiny clusters of microcalcifications are identified. 0.2 cm cyst in the periphery of the left testicle is noted. Right  epididymis:  Normal in size and appearance. Left epididymis:  0.4 cm cyst is noted. Hydrocele:  Small bilateral. Varicocele:  None visualized. Pulsed Doppler interrogation of both testes demonstrates normal low resistance arterial and venous waveforms bilaterally. IMPRESSION: No acute abnormality or finding to explain the patient's symptoms. 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 Current literature suggests that testicular microlithiasis is not a significant independent risk factor  for development of testicular carcinoma, and that follow up imaging is not warranted in the absence of other risk factors. Monthly testicular self-examination and annual physical exams are considered appropriate surveillance. If patient has other risk factors for testicular carcinoma, then referral to Urology should be considered. (Reference: DeCastro, et al.: A 5-Year Follow up Study of Asymptomatic Men with Testicular Microlithiasis. J Urol 2008; [id number].) Small cyst or spermatocele in the left epididymis. Electronically Signed   By: Inge Rise M.D.   On: 11/12/2017 13:40   Medications: I have reviewed the patient's current medications. Scheduled Meds: . atorvastatin  40 mg Oral q1800  . buPROPion  75 mg Oral Daily  . carvedilol  25 mg Oral BID WC  . cloNIDine  0.2 mg Transdermal Weekly  . clopidogrel  75 mg Oral QHS  . dorzolamide-timolol  1 drop Both Eyes BID  . feeding supplement (ENSURE ENLIVE)  237 mL Oral BID PC  . heparin  5,000 Units Subcutaneous Q8H  . insulin aspart  0-5 Units Subcutaneous QHS  . insulin aspart  0-9 Units Subcutaneous TID WC  . latanoprost  1 drop Both Eyes QHS  . mometasone-formoterol  2 puff Inhalation BID  . montelukast  10 mg Oral QHS  . sertraline  100 mg Oral Daily  . tamsulosin  0.8 mg Oral QHS   Continuous Infusions: . pencillin G potassium IV     PRN Meds:.acetaminophen, acetaminophen, hydrALAZINE, Melatonin,  polyethylene glycol, polyvinyl alcohol Assessment/Plan: Principal Problem:   Sepsis ([place]) Active Problems:   Diabetes mellitus ([place])   Hypertension   History of CVA (cerebrovascular accident)   Depression   AKI (acute kidney injury) ([place])   CKD stage 3 due to type 2 diabetes mellitus ([place])   Acute sepsis [facility])  Mr. Cody Silva is a 72 y.o. Male with past medical history of T2DM, HTN, and dementia who presented with fever, tachycardia, tachypnea, and foot callous/wound. Patient was admitted for high fever (104F) and subsequent sepsis workup.   #Sepsis. Patient presented with a 2-3 day history of confusion and high-grade fever. Initial workup of CXR, UA, EKG, and Head CT were all unremarkable. Influenza panel was negative. Blood cx were significant for streptococcus pyogenes infection prompting a discontinuation of patient's Vancomycin and Cefepime, and start of Penicillin 3 million units q4hrs. Patient's Penicillin dose was customized for patient's current renal function d/t AKI (Cr at 2.55, BUN at 28). First dose to be administered this evening. There was some concern for patient's low platelet levels at 80. However, due to the aggressive fluid hydration therapy, it is possible that laboratory values are dilutional as Hemoglobin, WBCs, and Platelets are all decreased simultaneously. Will continue to monitor. - Start Penicillin 3 million units q4hrs - D/c Vancomycin and Cefepime - Pharmacy assistance appreciated for abx dose - IVF Hydration with NS at 100 mL/hr - Monitor CBC and BMP  #HTN. Last filed BP at 177/83 with pulse rate at 78. Per ED RN note, Clonidine fell from arm soon after patient arrived, rebound hypertension was suspected on admission. Home medications were resumed and BP monitored. - Continue home Carvedilol - Continue home Clonidine patch 0.'2mg'$  - Hold home chlorthalidone in the setting of AKI  #T2DM. Patient insulin-dependent in the past. Wife reports that patient  lost ~30 lbs over 3 months time recently. DM currently well-controlled without insulin. Last A1c was 5.5. Recent blood sugars measured at 122, 123, and 106 at various times. - SSI-S - HS Scale - CBG monitoring  #  Testicular Pain. Patient reports ongoing testicular discomfort with onset prior to hospitalization. There was some concern for abrasion or open skin wounds, which may explain the sepsis. However, there is low suspicion of this as none were found on physical exam.  - US Scrotum w/ doppler ordered. No acute abnormality or finding to explain patient's symptoms. Small cyst or spermatocele in the left epididymis. Monthly testicular self-exam and annual physical exams considered appropriate for surveillance.  #AKI. H/x of CKD secondary to T2DM. Cr 2.45 on admission, baseline typically ~2.0-2.1.  - Monitor electrolytes - Avoid nephrotoxic agents  #History of CVA. Patient reports multiple CVAs in the past, most recently in December of 2018. Patient follows up with [facility].  - Continue home Clopidogrel 75 mg QHS - Continue home Atorvastatin 40 mg QD  #Depression - Continue home Bupropion 75 mg QD - Continue home Sertraline 100 mg QD  #BPH - Continue home Tamsulosin 0.4 mg QD  #FEN/GI:  - NS at 100 mL/hr  VTE ppx: SQ Heparin  Full code confirmed on admission  Disposition: Admit patient to observation with expected length of stay less than 2 midnights  This is a Careers information officer Note.  The care of the patient was discussed with Dr. Isac Sarna and the assessment and plan formulated with their assistance.  Please see their attached note for official documentation of the daily encounter.   LOS: 0 days   Toy Cookey, Medical Student 11/12/2017, 1:46 PM

## 2017-11-12 NOTE — Progress Notes (Signed)
Pharmacy Antibiotic Note Cody Silva is a 72 y.o. male admitted on 11/11/2017 with sepsis and found to have Group A Strep bacteremia. Pharmacy has been consulted for penicillin G dosing. Known CKD, SCr up to 2.55, CrCl ~30 ml/min. Remains febrile. Cefepime last given on 3/10 ~21:00.  Plan: Penicillin G 3 million units IV q4h - begin tonight Monitor renal function and adjust dose as needed Monitor clinical progress and culture data  Height: 6\' 1"  (185.4 cm) Weight: 184 lb 12.8 oz (83.8 kg) IBW/kg (Calculated) : 79.9  Temp (24hrs), Avg:102.2 F (39 C), Min:99.2 F (37.3 C), Max:104.8 F (40.4 C)  Recent Labs  Lab 11/11/17 1239 11/11/17 1303 11/12/17 0710  WBC 6.9  --  4.3  CREATININE 2.45*  --  2.55*  LATICACIDVEN  --  1.57  --     Estimated Creatinine Clearance: 30.5 mL/min (A) (by C-G formula based on SCr of 2.55 mg/dL (H)).    Allergies  Allergen Reactions  . Metformin And Related Diarrhea  . Tape Rash    Cefepime 3/10 >> 3/11 Vanc 3/10 >> 3/11 Pen G 3/11 >>   3/10 BCx: 2/2 GPC chains (BCID Strep pyogenes)   Thank you for allowing pharmacy to be a part of this patient's care.  Renold Genta, PharmD, BCPS Clinical Pharmacist Clinical phone for 11/12/2017 until 4p is [phone] After 4p, please call Main Rx at [phone] for assistance 11/12/2017 10:30 AM

## 2017-11-12 NOTE — ED Provider Notes (Addendum)
[place] 5W PROGRESSIVE CARE Provider Note   CSN: [id number] Arrival date & time: 11/11/17  1218     History   Chief Complaint Chief Complaint  Patient presents with  . Fever  . Altered Mental Status   Level 5 caveat: Altered mental status  HPI Cody Silva is a 72 y.o. male.  HPI 72 year old male presents the emergency department via home after noted to be acutely confused today.  There was some mild confusion over the past 2 days but there is report of increased confusion today.  Found to have a fever of 104.8 on arrival to the emergency department.  Denies chest pain and cough.  Denies shortness of breath.  Denies nausea vomiting diarrhea.  Patient is unable to provide any additional history given some confusion/acute delirium at this time   Past Medical History:  Diagnosis Date  . Anemia   . Arthritis    "right arm, right ankle, right side" (10/30/2014)  . Colon polyps   . Diabetic neuropathy ([place])   . Diabetic retinopathy ([place])   . H/O agent Orange exposure   . Headache    "@ least 3 times/wk" (10/30/2014)  . History of blood transfusion 10/30/2014   hematochezia  . History of gout   . Hypertension   . OSA on CPAP    "suppose to wear mask; I've got a call in for an equipment change" (10/30/2014)  . PTSD (post-traumatic stress disorder)    "service related"  . Seizures ([place])   . Stroke [facility]) 2014   left extremity deficits; facial left  . Type II diabetes mellitus [facility])     Patient Active Problem List   Diagnosis Date Noted  . Acute sepsis ([place]) 11/12/2017  . Sepsis ([place]) 11/11/2017  . Atrophy of muscle of right hand 04/04/2017  . Protein-calorie malnutrition, severe 01/19/2017  . Altered mental status 01/09/2017  . Facial droop 01/09/2017  . Cerebral thrombosis with cerebral infarction 01/09/2017  . Seizures ([place])   . Other hyperlipidemia   . OSA (obstructive sleep apnea) 09/12/2016  . Weight loss 09/12/2016  . Malnutrition of moderate degree  09/11/2016  . Syncope 09/10/2016  . H/O agent Orange exposure 12/24/2015  . Type 2 diabetes with nephropathy ([place])   . Near syncope 12/22/2015  . History of TIAs 09/14/2015  . CKD stage 3 due to type 2 diabetes mellitus ([place]) 09/14/2015  . Acute lower GI bleeding 10/30/2014  . History of colonic polyps 10/30/2014  . Depression 10/30/2014  . Symptomatic anemia 10/30/2014  . AKI (acute kidney injury) ([place]) 10/30/2014  . Ataxic gait 09/21/2014  . History of CVA (cerebrovascular accident) 02/22/2013  . Abnormal brain scan 02/21/2013  . Dizziness 02/21/2013  . Weakness 02/21/2013  . Diabetes mellitus ([place]) 02/21/2013  . Hypertension 02/21/2013    Past Surgical History:  Procedure Laterality Date  . BONE GRAFT HIP ILIAC CREST Left ~ 1976  . CATARACT EXTRACTION W/ INTRAOCULAR LENS  IMPLANT, BILATERAL Bilateral 2014-2015  . EP IMPLANTABLE DEVICE N/A 12/24/2015   Procedure: Loop Recorder Insertion;  Surgeon: Thompson Grayer, MD;  Location: [facility];  Service: Cardiovascular;  Laterality: N/A;  . TUMOR REMOVAL Right ~ 1976   "arm; had to take bone left hip to add to the repair"       Home Medications    Prior to Admission medications   Medication Sig Start Date End Date Taking? Authorizing Provider  acetaminophen (TYLENOL) 500 MG tablet Take 1 tablet (500 mg total) by  mouth every 6 (six) hours as needed for mild pain, moderate pain, fever or headache. 11/01/14  Yes Rabbani, Marjan, MD  albuterol (PROVENTIL HFA;VENTOLIN HFA) 108 (90 Base) MCG/ACT inhaler Inhale 1 puff into the lungs every 6 (six) hours as needed for wheezing or shortness of breath.   Yes [provider]  ammonium lactate (LAC-HYDRIN) 12 % lotion Apply 1 application topically as needed for dry skin.   Yes [provider]  atorvastatin (LIPITOR) 20 MG tablet Take 1 tablet (20 mg total) by mouth daily at 6 PM. Patient taking differently: Take 40 mg by mouth daily at 6 PM.  09/12/16  Yes Asencion Partridge,  MD  budesonide-formoterol [facility]) 80-4.5 MCG/ACT inhaler Inhale 2 puffs into the lungs 2 (two) times daily.   Yes [provider]  buPROPion (WELLBUTRIN) 75 MG tablet Take 75 mg by mouth daily.   Yes [provider]  Carboxymethylcellulose Sod PF 0.25 % SOLN Place 1 drop into both eyes 4 (four) times daily.   Yes [provider]  carvedilol (COREG) 12.5 MG tablet Take 1 tablet (12.5 mg total) by mouth 2 (two) times daily with a meal. Patient taking differently: Take 25 mg by mouth 2 (two) times daily with a meal.  01/20/17  Yes Molt, Bethany, DO  cetirizine (ZYRTEC) 10 MG tablet Take 10 mg by mouth at bedtime as needed for allergies.   Yes [provider]  chlorthalidone (HYGROTON) 25 MG tablet Take 25 mg by mouth daily.   Yes [provider]  Cholecalciferol (VITAMIN D3) 2000 units TABS Take 2,000 Units by mouth daily.   Yes [provider]  cloNIDine (CATAPRES - DOSED IN MG/24 HR) 0.2 mg/24hr patch Place 0.2 mg onto the skin once a week.   Yes [provider]  clopidogrel (PLAVIX) 75 MG tablet Take 1 tablet (75 mg total) by mouth at bedtime. Patient taking differently: Take 75 mg by mouth daily.  11/08/14  Yes Rabbani, Ricarda Frame, MD  dorzolamide-timolol (COSOPT) 22.3-6.8 MG/ML ophthalmic solution Place 1 drop into both eyes as needed.   Yes [provider]  fluticasone (FLONASE) 50 MCG/ACT nasal spray Place 1 spray into both nostrils daily.   Yes [provider]  gabapentin (NEURONTIN) 300 MG capsule Take 300 mg by mouth at bedtime.    Yes [provider]  insulin aspart (NOVOLOG) 100 UNIT/ML injection Inject 5-7 Units into the skin as needed for high blood sugar. Takes 5-7 units if the blood sugar is over 150   Yes [provider]  ketotifen (ZADITOR) 0.025 % ophthalmic solution Place 1 drop into both eyes as needed.   Yes [provider]  latanoprost (XALATAN) 0.005 % ophthalmic solution  Place 1 drop into both eyes at bedtime.    Yes [provider]  losartan (COZAAR) 100 MG tablet Take 100 mg by mouth daily.   Yes [provider]  Melatonin 3 MG TABS Take 3 mg by mouth at bedtime.   Yes [provider]  montelukast (SINGULAIR) 10 MG tablet Take 10 mg by mouth at bedtime.   Yes [provider]  oxyCODONE-acetaminophen (PERCOCET/ROXICET) 5-325 MG tablet Take 1 tablet by mouth every 8 (eight) hours as needed for severe pain. 10/09/17  Yes McDonald, Mia A, PA-C  potassium chloride (K-DUR,KLOR-CON) 10 MEQ tablet Take 10 mEq by mouth 2 (two) times daily.   Yes [provider]  sertraline (ZOLOFT) 100 MG tablet Take 100 mg by mouth daily.   Yes [provider]  tamsulosin (FLOMAX) 0.4 MG CAPS capsule Take 0.8 mg by mouth at bedtime.   Yes [provider]  terbinafine (LAMISIL) 1 % cream Apply 1 application topically 2 (two) times daily.   Yes [provider]  tolnaftate (TINACTIN) 1 % powder Apply 1 application topically 2 (two) times daily.   Yes [provider]  feeding supplement, ENSURE ENLIVE, (ENSURE ENLIVE) LIQD Take 237 mLs by mouth 2 (two) times daily after a meal. Patient not taking: Reported on 11/11/2017 09/12/16   Asencion Partridge, MD    Family History Family History  Problem Relation Age of Onset  . Diabetes Mother   . Breast cancer Mother   . Heart attack Mother        CABG - Age 57  . Kidney disease Mother   . Stroke Brother   . Lung disease Father   . Neurofibromatosis Maternal Uncle   . Throat cancer Brother   . Hypertension Brother     Social History Social History   Tobacco Use  . Smoking status: Never Smoker  . Smokeless tobacco: Never Used  Substance Use Topics  . Alcohol use: Yes    Alcohol/week: 0.6 oz    Types: 1 Cans of beer per week    Comment: 10/30/2014 "might have a beer a couple times/yr"  . Drug use: No     Allergies   Metformin and related and  Tape   Review of Systems Review of Systems  Unable to perform ROS: Mental status change     Physical Exam Updated Vital Signs BP (!) 177/83 (BP Location: Right Arm) Comment: rn notified  Pulse 78   Temp (!) 100.9 F (38.3 C) (Oral) Comment: rn notified  Resp 20   Ht 6\' 1"  (1.854 m)   Wt 83.8 kg (184 lb 12.8 oz)   SpO2 100%   BMI 24.38 kg/m   Physical Exam  Constitutional: He is oriented to person, place, and time. He appears well-developed and well-nourished.  HENT:  Head: Normocephalic and atraumatic.  Eyes: EOM are normal.  Neck: Normal range of motion.  Cardiovascular: Normal rate, regular rhythm, normal heart sounds and intact distal pulses.  Pulmonary/Chest: Effort normal and breath sounds normal. No respiratory distress.  Abdominal: Soft. He exhibits no distension. There is no tenderness.  Musculoskeletal: Normal range of motion.  Neurological: He is alert and oriented to person, place, and time.  Skin: Skin is warm and dry.  Psychiatric: He has a normal mood and affect. Judgment normal.  Nursing note and vitals reviewed.    ED Treatments / Results  Labs (all labs ordered are listed, but only abnormal results are displayed) Labs Reviewed  BLOOD CULTURE ID PANEL (REFLEXED) - Abnormal; Notable for the following components:      Result Value             All other components within normal limits  COMPREHENSIVE METABOLIC PANEL - Abnormal; Notable for the following components:   Sodium 134 (*)    Chloride 98 (*)    Glucose, Bld 146 (*)    BUN 24 (*)    Creatinine, Ser 2.45 (*)    AST 43 (*)    ALT 16 (*)    GFR calc non Af Amer 25 (*)    GFR calc Af Amer 29 (*)    All other components within normal limits  CBC WITH DIFFERENTIAL/PLATELET - Abnormal; Notable for the following components:   RBC 4.03 (*)    Hemoglobin 12.1 (*)  HCT 36.0 (*)    Platelets 104 (*)    Lymphs Abs 0.5 (*)    All other components within normal limits  URINALYSIS, ROUTINE W  REFLEX MICROSCOPIC - Abnormal; Notable for the following components:   Hgb urine dipstick LARGE (*)    Ketones, ur 5 (*)    Protein, ur >=300 (*)    Bacteria, UA RARE (*)    All other components within normal limits  BASIC METABOLIC PANEL - Abnormal; Notable for the following components:   Potassium 3.1 (*)    CO2 21 (*)    Glucose, Bld 119 (*)    BUN 28 (*)    Creatinine, Ser 2.55 (*)    Calcium 8.4 (*)    GFR calc non Af Amer 24 (*)    GFR calc Af Amer 28 (*)    All other components within normal limits  CBC - Abnormal; Notable for the following components:   RBC 3.38 (*)    Hemoglobin 9.7 (*)    HCT 30.0 (*)    Platelets 80 (*)    All other components within normal limits  GLUCOSE, CAPILLARY - Abnormal; Notable for the following components:   Glucose-Capillary 122 (*)    All other components within normal limits  GLUCOSE, CAPILLARY - Abnormal; Notable for the following components:   Glucose-Capillary 123 (*)    All other components within normal limits  GLUCOSE, CAPILLARY - Abnormal; Notable for the following components:   Glucose-Capillary 106 (*)    All other components within normal limits  CULTURE, BLOOD (ROUTINE X 2)  CULTURE, BLOOD (ROUTINE X 2)  URINE CULTURE  INFLUENZA PANEL BY PCR (TYPE A & B)  I-STAT CG4 LACTIC ACID, ED    EKG  EKG Interpretation None       Radiology Dg Chest 2 View  Result Date: 11/11/2017 CLINICAL DATA:  Fevers and altered mental status. EXAM: CHEST - 2 VIEW COMPARISON:  Chest x-ray dated Jan 18, 2017. FINDINGS: Stable mild cardiomegaly. Unchanged loop recorder. Normal pulmonary vascularity. No focal consolidation, pleural effusion, or pneumothorax. No acute osseous abnormality. IMPRESSION: No active cardiopulmonary disease. Electronically Signed   By: Titus Dubin M.D.   On: 11/11/2017 13:23   Ct Head Wo Contrast  Result Date: 11/11/2017 CLINICAL DATA:  Altered level of consciousness. EXAM: CT HEAD WITHOUT CONTRAST TECHNIQUE:  Contiguous axial images were obtained from the base of the skull through the vertex without intravenous contrast. COMPARISON:  01/02/2017 FINDINGS: Brain: No evidence of acute infarction, hemorrhage, hydrocephalus, extra-axial collection or mass lesion/mass effect. Patchy white matter hypoattenuation is noted consistent with mild chronic microvascular ischemic change. Small focus of hypoattenuation near the genu of the right internal capsule and in the left basal ganglia consistent with old lacune infarcts. Vascular: No hyperdense vessel or unexpected calcification. Skull: Normal. Negative for fracture or focal lesion. Sinuses/Orbits: Globes and orbits are unremarkable. Sinuses and mastoid air cells are clear. Other: None. IMPRESSION: 1. No acute intracranial abnormalities. 2. Mild chronic microvascular ischemic change. Small basal ganglia lacune infarcts. Stable appearance from the prior head CT. Electronically Signed   By: Lajean Manes M.D.   On: 11/11/2017 15:09     U   Procedures Procedures (including critical care time)   CRITICAL CARE Performed by: Jola Schmidt Total critical care time: 33 minutes Critical care time was exclusive of separately billable procedures and treating other patients. Critical care was necessary to treat or prevent imminent or life-threatening deterioration. Critical care was time spent personally  by me on the following activities: development of treatment plan with patient and/or surrogate as well as nursing, discussions with consultants, evaluation of patient's response to treatment, examination of patient, obtaining history from patient or surrogate, ordering and performing treatments and interventions, ordering and review of laboratory studies, ordering and review of radiographic studies, pulse oximetry and re-evaluation of patient's condition.   Medications Ordered in ED Medications  acetaminophen (TYLENOL) suppository 650 mg (650 mg Rectal Given 11/11/17 1719)   cloNIDine (CATAPRES - Dosed in mg/24 hr) patch 0.2 mg (0.2 mg Transdermal Patch Applied 11/11/17 1914)  clopidogrel (PLAVIX) tablet 75 mg (75 mg Oral Given 11/11/17 2120)  carvedilol (COREG) tablet 25 mg (25 mg Oral Given 11/12/17 0800)  heparin injection 5,000 Units (5,000 Units Subcutaneous Given 11/12/17 1352)  polyethylene glycol (MIRALAX / GLYCOLAX) packet 17 g (not administered)  hydrALAZINE (APRESOLINE) tablet 10 mg (10 mg Oral Given 11/12/17 1021)  atorvastatin (LIPITOR) tablet 40 mg (40 mg Oral Given 11/11/17 2120)  mometasone-formoterol (DULERA) 100-5 MCG/ACT inhaler 2 puff (2 puffs Inhalation Given 11/12/17 0847)  buPROPion (WELLBUTRIN) tablet 75 mg (75 mg Oral Given 11/12/17 1020)  dorzolamide-timolol (COSOPT) 22.3-6.8 MG/ML ophthalmic solution 1 drop (1 drop Both Eyes Given 11/12/17 1023)  feeding supplement (ENSURE ENLIVE) (ENSURE ENLIVE) liquid 237 mL (237 mLs Oral Given 11/12/17 0800)  latanoprost (XALATAN) 0.005 % ophthalmic solution 1 drop (1 drop Both Eyes Given 11/11/17 2121)  Melatonin TABS 3 mg (not administered)  montelukast (SINGULAIR) tablet 10 mg (10 mg Oral Given 11/11/17 2120)  sertraline (ZOLOFT) tablet 100 mg (100 mg Oral Given 11/12/17 1020)  tamsulosin (FLOMAX) capsule 0.8 mg (0.8 mg Oral Given 11/11/17 2120)  0.9 %  sodium chloride infusion ( Intravenous New Bag/Given 11/11/17 2111)  polyvinyl alcohol (LIQUIFILM TEARS) 1.4 % ophthalmic solution 1 drop (not administered)  insulin aspart (novoLOG) injection 0-9 Units (0 Units Subcutaneous Not Given 11/12/17 1200)  insulin aspart (novoLOG) injection 0-5 Units (0 Units Subcutaneous Not Given 11/11/17 2212)  acetaminophen (TYLENOL) tablet 650 mg (650 mg Oral Given 11/12/17 0518)  penicillin G potassium 3 Million Units in dextrose 5 % 100 mL IVPB (not administered)  acetaminophen (TYLENOL) suppository 650 mg (650 mg Rectal Given 11/11/17 1229)  sodium chloride 0.9 % bolus 1,000 mL (0 mLs Intravenous Stopped 11/11/17 1441)    And   sodium chloride 0.9 % bolus 1,000 mL (0 mLs Intravenous Stopped 11/11/17 1652)    And  sodium chloride 0.9 % bolus 1,000 mL (0 mLs Intravenous Stopped 11/11/17 1441)  piperacillin-tazobactam (ZOSYN) IVPB 3.375 g (0 g Intravenous Stopped 11/11/17 1350)  vancomycin (VANCOCIN) IVPB 1000 mg/200 mL premix (0 mg Intravenous Stopped 11/11/17 1523)     Initial Impression / Assessment and Plan / ED Course  I have reviewed the triage vital signs and the nursing notes.  Pertinent labs & imaging results that were available during my care of the patient were reviewed by me and considered in my medical decision making (see chart for details).     Sepsis.  Acute confusion.  Broad-spectrum antibiotics.  30 cc/kg bolus given.  No clear source.  Blood cultures pending.  Patient be admitted the hospital.  Flu pending  Final Clinical Impressions(s) / ED Diagnoses   Final diagnoses:  Sepsis, due to unspecified organism [facility])  Acute confusion  Fever, unspecified fever cause  Wound eschar of foot  Scrotal pain    ED Discharge Orders    None       Jola Schmidt, MD 11/12/17 1503  Jola Schmidt, MD 11/26/17 2204

## 2017-11-13 ENCOUNTER — Encounter (HOSPITAL_COMMUNITY): Payer: Medicare Other

## 2017-11-13 DIAGNOSIS — R234 Changes in skin texture: Secondary | ICD-10-CM

## 2017-11-13 DIAGNOSIS — D696 Thrombocytopenia, unspecified: Secondary | ICD-10-CM

## 2017-11-13 LAB — BASIC METABOLIC PANEL
ANION GAP: 9 (ref 5–15)
BUN: 31 mg/dL — ABNORMAL HIGH (ref 6–20)
CALCIUM: 8.5 mg/dL — AB (ref 8.9–10.3)
CO2: 22 mmol/L (ref 22–32)
Chloride: 104 mmol/L (ref 101–111)
Creatinine, Ser: 2.4 mg/dL — ABNORMAL HIGH (ref 0.61–1.24)
GFR, EST AFRICAN AMERICAN: 30 mL/min — AB (ref >60)
GFR, EST NON AFRICAN AMERICAN: 26 mL/min — AB (ref >60)
Glucose, Bld: 143 mg/dL — ABNORMAL HIGH (ref 65–99)
POTASSIUM: 3.5 mmol/L (ref 3.5–5.1)
Sodium: 135 mmol/L (ref 135–145)

## 2017-11-13 LAB — GLUCOSE, CAPILLARY
GLUCOSE-CAPILLARY: 128 mg/dL — AB (ref 65–99)
GLUCOSE-CAPILLARY: 137 mg/dL — AB (ref 65–99)
Glucose-Capillary: 148 mg/dL — ABNORMAL HIGH (ref 65–99)
Glucose-Capillary: 261 mg/dL — ABNORMAL HIGH (ref 65–99)

## 2017-11-13 LAB — CUP PACEART REMOTE DEVICE CHECK
Implantable Pulse Generator Implant Date: 20170421
MDC IDC SESS DTM: 20190210203812

## 2017-11-13 LAB — CBC
HCT: 29.9 % — ABNORMAL LOW (ref 39.0–52.0)
Hemoglobin: 9.8 g/dL — ABNORMAL LOW (ref 13.0–17.0)
MCH: 28.9 pg (ref 26.0–34.0)
MCHC: 32.8 g/dL (ref 30.0–36.0)
MCV: 88.2 fL (ref 78.0–100.0)
PLATELETS: 72 10*3/uL — AB (ref 150–400)
RBC: 3.39 MIL/uL — AB (ref 4.22–5.81)
RDW: 13.2 % (ref 11.5–15.5)
WBC: 3.2 10*3/uL — AB (ref 4.0–10.5)

## 2017-11-13 MED ORDER — POTASSIUM CHLORIDE CRYS ER 20 MEQ PO TBCR: 30.0000 meq | EXTENDED_RELEASE_TABLET | Freq: Two times a day (BID) | ORAL | Status: DC

## 2017-11-13 MED ORDER — HYDRALAZINE HCL 20 MG/ML IJ SOLN
5.0000 mg | Freq: Four times a day (QID) | INTRAMUSCULAR | Status: DC | PRN
  Administered 2017-11-13 – 2017-11-18 (×4): 5 mg via INTRAVENOUS
  Filled 2017-11-13 (×3): qty 1

## 2017-11-13 MED ORDER — ONDANSETRON HCL 4 MG/2ML IJ SOLN
4.0000 mg | Freq: Three times a day (TID) | INTRAMUSCULAR | Status: DC | PRN
  Administered 2017-11-13: 4 mg via INTRAVENOUS
  Filled 2017-11-13: qty 2

## 2017-11-13 MED ORDER — CHLORPROMAZINE HCL 25 MG PO TABS
25.0000 mg | ORAL_TABLET | Freq: Three times a day (TID) | ORAL | Status: AC
  Administered 2017-11-13 (×2): 25 mg via ORAL
  Filled 2017-11-13 (×3): qty 1

## 2017-11-13 MED ORDER — HYDROCERIN EX CREA
TOPICAL_CREAM | Freq: Two times a day (BID) | CUTANEOUS | Status: DC
  Administered 2017-11-13 – 2017-11-18 (×10): via TOPICAL
  Filled 2017-11-13: qty 113

## 2017-11-13 MED ORDER — SIMETHICONE 80 MG PO CHEW
80.0000 mg | CHEWABLE_TABLET | Freq: Once | ORAL | Status: AC
  Administered 2017-11-13: 80 mg via ORAL
  Filled 2017-11-13: qty 1

## 2017-11-13 NOTE — Consult Note (Signed)
[place] Nurse wound consult note Reason for Consult:Patient known to our team from a previous admission.  Seen by my partner D. Barbie Haggis in January of 2018. 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  Is followed by the [facility] as an outpatient for periodic paring of the chronic calluses. Wound type: Neuropathic Pressure Injury POA:NA Measurement: Two calluses: One at 5th metatarsal head measures 3cm x 2.5cm and one at great toe measuring 2cm round. There is a linear wound measuring 0.2cm x 3cm across the fat pad of the metatarsal heads. Wound bed: As described above.  No wounds. No drainage. NO fl;uctuance and no periwound erythema or warmth. Drainage (amount, consistency, odor) As described above Periwound:As described above Dressing procedure/placement/frequency: I will implement a conservative POC to both moisturize the LEs (Eucerin, twice daily after bathing) and soften the calluses (white petrolatum gauze placed over the calluses twice daily). Patient and his family are appraised of POC and are in agreement.  Thank you for inviting [place] to participate in the care of this nice gentleman.  [place] nursing team will not follow, but will remain available to this patient, the nursing and medical teams.  Please re-consult if needed. Maudie Flakes, MSN, RN, [place], [facility]  Pager# [phone]

## 2017-11-13 NOTE — Progress Notes (Signed)
Patient was vomiting,  Blood pressure (!) 192/104, pulse 74, temperature 99.3 F (37.4 C), temperature source Oral, resp. rate 20, height 6\' 1"  (1.854 m), weight 83.8 kg (184 lb 12.8 oz), SpO2 100 %. Patient's wife reports that he has had the hiccups since yesterday.  Paged provider for medication for vomiting and IV medication for blood pressure, and to make aware of hiccups per request. Will continue to monitor and implement orders.

## 2017-11-13 NOTE — Progress Notes (Signed)
Subjective:  Patient afebrile overnight.  He initially does not voice any concerns, but reports scrotal pain when examined.  Otherwise doing well.  Discussed with patient ongoing antibiotic therapy for bacteremia and continuing to monitor scrotal pain.  He verbalized understanding and is in agreement with plan.  We will call family later today to update them.  Objective:  Vital signs in last 24 hours: Vitals:   11/13/17 0531 11/13/17 0806 11/13/17 1023 11/13/17 1331  BP: (!) 175/84  (!) 192/104 (!) 169/78  Pulse: 67  74 64  Resp: 18  20 20   Temp: 98.8 F (37.1 C)  99.3 F (37.4 C) 99.3 F (37.4 C)  TempSrc: Oral  Oral Oral  SpO2: 97% 100% 100% 100%  Weight:      Height:       Physical Exam  Constitutional: He is oriented to person, place, and time. He appears well-developed and well-nourished.  Elderly male lying in bed in no acute distress  Cardiovascular: Normal rate, regular rhythm and normal heart sounds. Exam reveals no gallop and no friction rub.  No murmur heard. Pulmonary/Chest: Effort normal. No respiratory distress.  Abdominal: Soft. Bowel sounds are normal. He exhibits no distension. There is no tenderness.  Musculoskeletal: He exhibits no edema.  Neurological: He is alert and oriented to person, place, and time.  Skin: He is not diaphoretic.  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  # GAS Bacteremia: On IV penicillin G Day 2.  Afebrile overnight her hemogram is stable.  White blood cell count continues to trend down. Source of bacteremia likely right foot.  Right foot MRI without evidence suggestive of acute osteomyelitis.  We will continue antibiotic therapy as below. - IV Penicillin G q4h - Day 2 - Repeat blood cx  - PT/OT  # Scrotal pain: He continues to complain of bilateral testicular pain on palpation. Scrotal US with bilateral hydroceles and microcalcifications in left epididymis, but otherwise unremarkable. - Will continue to monitor   # Thrombocytopenia: This problem is chronic.  Unclear if he has been worked up for this as he is a [place] patient and records not available to review.  Platelets downtrending for the past 2 days, 72 this morning.  Plt 104 on admission. He is on SQ heparin for DVT prophylaxis with no signs/symptoms of bleeding.  We will continue to monitor.  If platelets decrease below 50 will continue SQ heparin. - CBC in AM - D/c SQ heparin if plt < 50  # HTN: Hypertensive in setting of volume resuscitation.  Has IV Hydralazine PRN ordered.  We will continue to monitor. - Continue home Coreg 25 mg BID - Continue home clonidine patch 0.2mg   - Holding home chlorthalidone in the setting of AKI   # T2DM: Patient was insulin dependent in the past. Per  wife, he lost 30 lbs in 3 months in the recent past. He was worked up for this at the [place] with no etiology found. Since then, his DM has been well controlled and he has not required insulin. His last A1c is 5.5, unclear when this was obtained. CBGs at goal.  - SSI-S + HS scale  - CBG monitoring  - Follow up Vascular ABIs  # AKI on CKD: Renal function stable, Cr 2.4. Will continue to monitor.  - Monitoring electrolytes  - Avoid nephrotoxic agents  - BMP in AM  # History of CVA: Multiple CVAs in the past. Most recent CVA in 08/2017 in residual R-sided deficits. Follows up  with [facility].  - Continue home Plavix 75 mg QD  - Continue home atorvastatin 40 mg QD   # ?Depression: Patient med list medication includes Wellbutrin and Zoloft.  - Continue home Bupropion 75 mg QD - Continue home Zoloft 100 mg QD   # ?BPH: - Continue home Tamsulosin 0.4 mg QD     Dispo: Anticipated discharge in approximately 2-3 day(s) pending improvement in clinical status and completion of worjup.   Cody Roche, MD 11/13/2017, 3:51 PM [phone]

## 2017-11-13 NOTE — Evaluation (Signed)
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   Head CT revealed No acute intracranial abnormalities, mild chronic microvascular ischemic change, small basal ganglia lacune infarcts with overall stable appearance from the prior head CT.  Clinical Impression  Patient presents with decreased independence with mobility due to weakness, decreased activity tolerance and will benefit from skilled PT in the acute setting to allow d/c home with family support.  Currently min A for OOB to chair with RW.  Was ambulatory with cane and some help for ADL's prior to admission. Will need follow up HHPT at d/c.     Follow Up Recommendations Home health PT;Supervision/Assistance - 24 hour    Equipment Recommendations  None recommended by PT    Recommendations for Other Services OT consult     Precautions / Restrictions Precautions Precautions: Fall      Mobility  Bed Mobility Overal bed mobility: Needs Assistance Bed Mobility: Supine to Sit     Supine to sit: Min guard;HOB elevated     General bed mobility comments: greatly increased time required, assist for scooting to EOB with mod cues   Transfers Overall transfer level: Needs assistance Equipment used: Rolling walker (2 wheeled) Transfers: Sit to/from [employer] Transfers Sit to Stand: From elevated surface;Min assist Stand pivot transfers: Min assist       General transfer comment: cues for hand placement, assist for balance; piovot to recliner only per pt with assist for balance, safety  Ambulation/Gait             General Gait Details: pt declined ambulation  Stairs            Wheelchair Mobility     Modified Rankin (Stroke Patients Only)       Balance Overall balance assessment: Needs assistance   Sitting balance-Leahy Scale: Fair     Standing balance support: Bilateral upper extremity supported Standing balance-Leahy Scale: Poor                               Pertinent Vitals/Pain Pain Assessment: No/denies pain    Home Living Family/patient expects to be discharged to:: Private residence Living Arrangements: Spouse/significant other;Children(daughter) Available Help at Discharge: Family;Available 24 hours/day Type of Home: House Home Access: Stairs to enter Entrance Stairs-Rails: None Entrance Stairs-Number of Steps: 2 Home Layout: Two level;Bed/bath upstairs Home Equipment: Walker - 4 wheels;Cane - single point;Electric scooter      Prior Function Level of Independence: Independent with assistive device(s)         Comments: uses cane ocassionally     Hand Dominance   Dominant Hand: Right    Extremity/Trunk Assessment        Lower Extremity Assessment Lower Extremity Assessment: Generalized weakness;RLE deficits/detail RLE Deficits / Details: c/o R knee pain with bending due to "fell in shower about a month ago"       Communication   Communication: No difficulties  Cognition Arousal/Alertness: Awake/alert Behavior During Therapy: WFL for tasks assessed/performed Overall Cognitive Status: No family/caregiver present to determine baseline cognitive functioning  General Comments      Exercises     Assessment/Plan    PT Assessment Patient needs continued PT services  PT Problem List Decreased strength;Decreased mobility;Decreased safety awareness;Decreased balance;Decreased knowledge of use of DME       PT Treatment Interventions Gait training;Therapeutic exercise;Patient/family education;Stair training;Balance training;Functional mobility training;DME  instruction;Therapeutic activities    PT Goals (Current goals can be found in the Care Plan section)  Acute Rehab PT Goals Patient Stated Goal: to go home PT Goal Formulation: With patient Time For Goal Achievement: 11/27/17 Potential to Achieve Goals: Good    Frequency Min 3X/week   Barriers to discharge        Co-evaluation               AM-PAC PT "6 Clicks" Daily Activity  Outcome Measure Difficulty turning over in bed (including adjusting bedclothes, sheets and blankets)?: A Little Difficulty moving from lying on back to sitting on the side of the bed? : A Lot Difficulty sitting down on and standing up from a chair with arms (e.g., wheelchair, bedside commode, etc,.)?: Unable Help needed moving to and from a bed to chair (including a wheelchair)?: A Little Help needed walking in hospital room?: A Lot Help needed climbing 3-5 steps with a railing? : A Lot 6 Click Score: 13    End of Session Equipment Utilized During Treatment: Gait belt Activity Tolerance: Patient limited by fatigue Patient left: with chair alarm set;in chair;with call bell/phone within reach   PT Visit Diagnosis: Other abnormalities of gait and mobility (R26.89);Muscle weakness (generalized) (M62.81)    Time: 0350-0938 PT Time Calculation (min) (ACUTE ONLY): 28 min   Charges:   PT Evaluation $PT Eval Moderate Complexity: 1 Mod PT Treatments $Therapeutic Activity: 8-22 mins   PT G CodesMagda Kiel, [place] [phone] 11/13/2017   Reginia Naas 11/13/2017, 5:13 PM

## 2017-11-13 NOTE — Progress Notes (Signed)
Subjective: Cody Silva is a 72 y.o. Male with past medical history of T2DM, HTN, and dementia who presented with fever (104F), tachycardia, tachypnea, and foot callous/wound currently treated for sepsis. Patient reports feeling well overnight with no acute distress. He reports no pain or discomfort with his right foot. He reports that he cannot recall any trauma to his groin area and still endorses persistent pain in his scrotum. He states that it is about the same severity as it was yesterday. He denies headaches, dizziness, chest pain, and shortness of breath. He denies any discomfort with his feet.   Objective: Vital signs in last 24 hours: Vitals:   11/13/17 0105 11/13/17 0531 11/13/17 0806 11/13/17 1023  BP:  (!) 175/84  (!) 192/104  Pulse:  67  74  Resp:  18  20  Temp: 98.5 F (36.9 C) 98.8 F (37.1 C)  99.3 F (37.4 C)  TempSrc: Oral Oral  Oral  SpO2:  97% 100% 100%  Weight:      Height:       Weight change:   Intake/Output Summary (Last 24 hours) at 11/13/2017 1122 Last data filed at 11/13/2017 0600 Gross per 24 hour  Intake 300 ml  Output 200 ml  Net 100 ml   BP (!) 192/104 (BP Location: Left Arm)   Pulse 74   Temp 99.3 F (37.4 C) (Oral)   Resp 20   Ht 6\' 1"  (1.854 m)   Wt 184 lb 12.8 oz (83.8 kg)   SpO2 100%   BMI 24.38 kg/m   General Appearance:    Tired, cooperative, no distress, lying in bed after just recently having woken up.  Head:    Normocephalic, without obvious abnormality, atraumatic  Neck:   Supple, symmetrical, trachea midline, no adenopathy;       thyroid:  No enlargement/tenderness/nodules; no carotid   bruit or JVD  Lungs:     Clear to auscultation bilaterally, respirations unlabored  Chest wall:    No tenderness or deformity  Heart:    Regular rate and rhythm, S1 and S2 normal, no murmur, rub   or gallop  Abdomen:     Soft, non-tender, bowel sounds active all four quadrants,    no masses, no organomegaly  Genitalia:    Testicles  tender to palpation with light touch, no skin abrasions or wounds appreciated  Extremities:   Callus on lateral aspect of R foot, linear wound at base of toes on plantar surface with scabbing. Mild edema (no significant change during hospital stay). Loss of sensation to touch on R foot  Pulses:   2+ and symmetric all extremities  Skin:   Skin color, texture, turgor normal, no rashes or lesions  Neurologic:   Alert and oriented x 3    Lab Results (Resulted on 4:58 on 03/12)  Ref Range & Units 04:58  WBC 4.0 - 10.5 K/uL 3.2   RBC 4.22 - 5.81 MIL/uL 3.39   Hemoglobin 13.0 - 17.0 g/dL 9.8   HCT 39.0 - 52.0 % 29.9  MCV 78.0 - 100.0 fL 88.2   MCH 26.0 - 34.0 pg 28.9   MCHC 30.0 - 36.0 g/dL 32.8   RDW 11.5 - 15.5 % 13.2   Platelets 150 - 400 K/uL 72     Ref Range & Units 04:58  Sodium 135 - 145 mmol/L 135   Potassium 3.5 - 5.1 mmol/L 3.5   Chloride 101 - 111 mmol/L 104   CO2 22 - 32 mmol/L 22  Glucose, Bld 65 - 99 mg/dL 143  Abnormally high    BUN 6 - 20 mg/dL 31  Abnormally high    Creatinine, Ser 0.61 - 1.24 mg/dL 2.40  Abnormally high    Calcium 8.9 - 10.3 mg/dL 8.5  Abnormally low    GFR calc non Af Amer >60 mL/min 26  Abnormally low    GFR calc Af Amer >60 mL/min 30  Abnormally low    Anion gap 5 - 15 9     Micro Results: Recent Results (from the past 240 hour(s))  Blood Culture (routine x 2)     Status: Abnormal (Preliminary result)   Collection Time: 11/11/17  1:04 PM  Result Value Ref Range Status   Specimen Description BLOOD LEFT ANTECUBITAL  Final   Special Requests   Final    BOTTLES DRAWN AEROBIC AND ANAEROBIC Blood Culture adequate volume   Culture  Setup Time   Final    GRAM POSITIVE COCCI IN CHAINS IN BOTH AEROBIC AND ANAEROBIC BOTTLES CRITICAL VALUE NOTED.  VALUE IS CONSISTENT WITH PREVIOUSLY REPORTED AND CALLED VALUE. Performed at [facility], [place] [address], [place], [place] [address]    Culture GROUP A STREP (S.PYOGENES) ISOLATED (A)  Final    Report Status PENDING  Incomplete  Blood Culture (routine x 2)     Status: Abnormal (Preliminary result)   Collection Time: 11/11/17  1:24 PM  Result Value Ref Range Status   Specimen Description BLOOD RIGHT ANTECUBITAL  Final   Special Requests   Final    BOTTLES DRAWN AEROBIC AND ANAEROBIC Blood Culture adequate volume   Culture  Setup Time   Final    GRAM POSITIVE COCCI IN CHAINS IN BOTH AEROBIC AND ANAEROBIC BOTTLES CRITICAL RESULT CALLED TO, READ BACK BY AND VERIFIED WITH: A.MASTERS,PHARMD AT 0348 ON 11/12/17 BY G.MCADOO    Culture (A)  Final    GROUP A STREP (S.PYOGENES) ISOLATED SUSCEPTIBILITIES TO FOLLOW Performed at [facility], [place] [address], [place], [place] [address]    Report Status PENDING  Incomplete  Blood Culture ID Panel (Reflexed)     Status: Abnormal   Collection Time: 11/11/17  1:24 PM  Result Value Ref Range Status   Enterococcus species NOT DETECTED NOT DETECTED Final   Listeria monocytogenes NOT DETECTED NOT DETECTED Final   Staphylococcus species NOT DETECTED NOT DETECTED Final   Staphylococcus aureus NOT DETECTED NOT DETECTED Final   Streptococcus species DETECTED (A) NOT DETECTED Final    Comment: CRITICAL RESULT CALLED TO, READ BACK BY AND VERIFIED WITH: A.MASTERS,PHARMD AT 0348 ON 11/12/17 BY G.MCADOO    Streptococcus agalactiae NOT DETECTED NOT DETECTED Final   Streptococcus pneumoniae NOT DETECTED NOT DETECTED Final   Streptococcus pyogenes DETECTED (A) NOT DETECTED Final    Comment: CRITICAL RESULT CALLED TO, READ BACK BY AND VERIFIED WITH: A.MASTERS,PHARMD AT 8850 ON 11/12/17 BY G.MCADOO    Acinetobacter baumannii NOT DETECTED NOT DETECTED Final   Enterobacteriaceae species NOT DETECTED NOT DETECTED Final   Enterobacter cloacae complex NOT DETECTED NOT DETECTED Final   Escherichia coli NOT DETECTED NOT DETECTED Final   Klebsiella oxytoca NOT DETECTED NOT DETECTED Final   Klebsiella pneumoniae NOT DETECTED NOT DETECTED Final   Proteus  species NOT DETECTED NOT DETECTED Final   Serratia marcescens NOT DETECTED NOT DETECTED Final   Haemophilus influenzae NOT DETECTED NOT DETECTED Final   Neisseria meningitidis NOT DETECTED NOT DETECTED Final   Pseudomonas aeruginosa NOT DETECTED NOT DETECTED Final  Candida albicans NOT DETECTED NOT DETECTED Final   Candida glabrata NOT DETECTED NOT DETECTED Final   Candida krusei NOT DETECTED NOT DETECTED Final   Candida parapsilosis NOT DETECTED NOT DETECTED Final   Candida tropicalis NOT DETECTED NOT DETECTED Final    Comment: Performed at [facility], [place] [address], [place], [place] [address]   Studies/Results: Dg Chest 2 View  Result Date: 11/11/2017 CLINICAL DATA:  Fevers and altered mental status. EXAM: CHEST - 2 VIEW COMPARISON:  Chest x-ray dated Jan 18, 2017. FINDINGS: Stable mild cardiomegaly. Unchanged loop recorder. Normal pulmonary vascularity. No focal consolidation, pleural effusion, or pneumothorax. No acute osseous abnormality. IMPRESSION: No active cardiopulmonary disease. Electronically Signed   By: Titus Dubin M.D.   On: 11/11/2017 13:23   Ct Head Wo Contrast  Result Date: 11/11/2017 CLINICAL DATA:  Altered level of consciousness. EXAM: CT HEAD WITHOUT CONTRAST TECHNIQUE: Contiguous axial images were obtained from the base of the skull through the vertex without intravenous contrast. COMPARISON:  01/02/2017 FINDINGS: Brain: No evidence of acute infarction, hemorrhage, hydrocephalus, extra-axial collection or mass lesion/mass effect. Patchy white matter hypoattenuation is noted consistent with mild chronic microvascular ischemic change. Small focus of hypoattenuation near the genu of the right internal capsule and in the left basal ganglia consistent with old lacune infarcts. Vascular: No hyperdense vessel or unexpected calcification. Skull: Normal. Negative for fracture or focal lesion. Sinuses/Orbits: Globes and orbits are unremarkable. Sinuses and mastoid air  cells are clear. Other: None. IMPRESSION: 1. No acute intracranial abnormalities. 2. Mild chronic microvascular ischemic change. Small basal ganglia lacune infarcts. Stable appearance from the prior head CT. Electronically Signed   By: Lajean Manes M.D.   On: 11/11/2017 15:09   Mr Foot Right Wo Contrast  Result Date: 11/12/2017 CLINICAL DATA:  72 year old diabetic with foot swelling. Question osteomyelitis. EXAM: MRI OF THE RIGHT FOREFOOT WITHOUT CONTRAST TECHNIQUE: Multiplanar, multisequence MR imaging of the right forefoot was performed. No intravenous contrast was administered. COMPARISON:  None. FINDINGS: Bones/Joint/Cartilage No acute fracture or frank bone destruction. Osteoarthritic joint space narrowing of the first MTP with minimal spurring. Lesser degrees of mild joint space narrowing is seen of the DIP and PIP joints of the second through fifth digits. No cortical bone destruction is identified. Probable reactive edema involving the great through third toes from approximately the heads of the proximal phalanges distally. No focal chondromalacia. There is hallux valgus of the great toe. Ligaments Negative Muscles and Tendons No evidence of pyomyositis. The extensor and flexor tendons are maintained without apparent tenosynovitis. Soft tissues Mild-to-moderate generalized subcutaneous and intramuscular edema. IMPRESSION: 1. No conclusive evidence for acute osteomyelitis. Probable mild reactive edema secondary to osteoarthritic change of first through third digits. No frank bone destruction is identified. 2. Generalized soft tissue edema of the forefoot without focal fluid collection or abscess. Electronically Signed   By: Ashley Royalty M.D.   On: 11/12/2017 15:33   Dg Foot Complete Right  Result Date: 11/11/2017 CLINICAL DATA:  Foot wound EXAM: RIGHT FOOT COMPLETE - 3+ VIEW COMPARISON:  None. FINDINGS: Hallux valgus deformity is noted. No acute fracture or dislocation is seen. No soft tissue  abnormality is noted. No bony erosion is seen. IMPRESSION: No acute abnormality noted. Electronically Signed   By: Inez Catalina M.D.   On: 11/11/2017 19:04   US Scrotum W/doppler  Result Date: 11/12/2017 CLINICAL DATA:  Bilateral testicular tenderness. EXAM: SCROTAL ULTRASOUND DOPPLER ULTRASOUND OF THE TESTICLES TECHNIQUE: Complete ultrasound examination of the testicles,  epididymis, and other scrotal structures was performed. Color and spectral Doppler ultrasound were also utilized to evaluate blood flow to the testicles. COMPARISON:  None. FINDINGS: Right testicle Measurements: 2.9 x 1.7 x 2.4 cm. No mass or microlithiasis visualized. Left testicle Measurements: 3.2 x 2.1 x 2.3 cm. Two tiny clusters of microcalcifications are identified. 0.2 cm cyst in the periphery of the left testicle is noted. Right epididymis:  Normal in size and appearance. Left epididymis:  0.4 cm cyst is noted. Hydrocele:  Small bilateral. Varicocele:  None visualized. Pulsed Doppler interrogation of both testes demonstrates normal low resistance arterial and venous waveforms bilaterally. IMPRESSION: No acute abnormality or finding to explain the patient's symptoms. 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 Current literature suggests that testicular microlithiasis is not a significant independent risk factor for development of testicular carcinoma, and that follow up imaging is not warranted in the absence of other risk factors. Monthly testicular self-examination and annual physical exams are considered appropriate surveillance. If patient has other risk factors for testicular carcinoma, then referral to Urology should be considered. (Reference: DeCastro, et al.: A 5-Year Follow up Study of Asymptomatic Men with Testicular Microlithiasis. J Urol 2008; [id number].) Small cyst or spermatocele in the left epididymis. Electronically Signed   By: Inge Rise M.D.   On: 11/12/2017  13:40   Medications: I have reviewed the patient's current medications. Scheduled Meds: . atorvastatin  40 mg Oral q1800  . buPROPion  75 mg Oral Daily  . carvedilol  25 mg Oral BID WC  . cloNIDine  0.2 mg Transdermal Weekly  . clopidogrel  75 mg Oral QHS  . dorzolamide-timolol  1 drop Both Eyes BID  . feeding supplement (ENSURE ENLIVE)  237 mL Oral BID PC  . heparin  5,000 Units Subcutaneous Q8H  . insulin aspart  0-5 Units Subcutaneous QHS  . insulin aspart  0-9 Units Subcutaneous TID WC  . latanoprost  1 drop Both Eyes QHS  . mometasone-formoterol  2 puff Inhalation BID  . montelukast  10 mg Oral QHS  . sertraline  100 mg Oral Daily  . tamsulosin  0.8 mg Oral QHS   Continuous Infusions: . pencillin G potassium IV Stopped (11/13/17 0842)   PRN Meds:.acetaminophen, acetaminophen, hydrALAZINE, hydrALAZINE, Melatonin, ondansetron (ZOFRAN) IV, polyethylene glycol, polyvinyl alcohol Assessment/Plan: Principal Problem:   Sepsis ([place]) Active Problems:   Diabetes mellitus ([place])   Hypertension   History of CVA (cerebrovascular accident)   Depression   AKI (acute kidney injury) ([place])   CKD stage 3 due to type 2 diabetes mellitus ([place])   Acute sepsis ([facility])   Scrotal pain  Mr. Talal Fritchman is a 72 y.o. Male with past medical history of T2DM, HTN, and dementia who presented with fever, tachycardia, tachypnea, and foot callous/wound. Patient was admitted for high fever (104F) and subsequent sepsis workup.   #Sepsis. Patient presented with a 2-3 day history of confusion and high-grade fever. Initial workup of CXR, UA, EKG, and Head CT were all unremarkable. Influenza panel was negative. Blood cx were significant for streptococcus pyogenes infection prompting a discontinuation of patient's Vancomycin and Cefepime, and start of Penicillin 3 million units q4hrs. Patient's Penicillin dose was customized for patient's current renal function d/t AKI (Cr at 2.55, BUN at 28). First dose to  be administered this evening. There was some concern for patient's low platelet levels at 80. However, due to the aggressive fluid hydration therapy, it is possible that laboratory values  are dilutional as Hemoglobin, WBCs, and Platelets are all decreased simultaneously. Recent CBC shows Plts at 72 (down from 80), WBC 3.2 (down from 4.3), Hgb 9.8 (stable). Will continue to monitor. - Continue Penicillin 3 million units q4hrs - Vancomycin and Cefepime d/c on 03/11; Pharmacy assistance appreciated for abx dose - Repeat blood cultures today - Hold IVF hydration to account for dilutional anemia - Continue to monitor CBC  #HTN. Last filed BP at 177/83 with pulse rate at 78. Per ED RN note, Clonidine fell from arm soon after patient arrived, rebound hypertension was suspected on admission. Home medications were resumed and BP monitored. - Continue home Carvedilol - Continue home Clonidine patch 0.2mg  - Hold home chlorthalidone in the setting of AKI  - Consider ABI to assess for PAD - Consider inpatient antihypertensive management if BP uncorrected after fluid adjustment  #T2DM. Patient insulin-dependent in the past. Wife reports that patient lost ~30 lbs over 3 months time recently. DM currently well-controlled without insulin. Last A1c was 5.5. Recent blood sugars measured at 122, 123, and 106 at various times. - SSI-S - HS Scale - CBG monitoring  #Testicular Pain. Patient reports ongoing testicular discomfort with onset prior to hospitalization. There was some concern for abrasion or open skin wounds, which may explain the sepsis. However, there is low suspicion of this as none were found on physical exam.  - US Scrotum w/ doppler resulted. No acute abnormality or finding to explain patient's symptoms. Small cyst or spermatocele in the left epididymis. Monthly testicular self-exam and annual physical exams considered appropriate for surveillance.  #AKI. H/x of CKD secondary to T2DM. Cr 2.45 on  admission, baseline typically ~2.0-2.1.  - Monitor electrolytes - Avoid nephrotoxic agents  #History of CVA. Patient reports multiple CVAs in the past, most recently in December of 2018. Patient follows up with [facility].  - Continue home Clopidogrel 75 mg QHS - Continue home Atorvastatin 40 mg QD  #Depression - Continue home Bupropion 75 mg QD - Continue home Sertraline 100 mg QD  #BPH - Continue home Tamsulosin 0.4 mg QD  #FEN/GI:  - D/c NS at 100 mL/hr  VTE ppx: SQ Heparin  Full code confirmed on admission  Disposition: Admit patient to observation with expected length of stay less than 2 midnights  This is a Careers information officer Note.  The care of the patient was discussed with Dr. Isac Sarna and the assessment and plan formulated with their assistance.  Please see their attached note for official documentation of the daily encounter.   LOS: 1 day   Toy Cookey, Medical Student 11/13/2017, 11:22 AM

## 2017-11-13 NOTE — Progress Notes (Signed)
Wife concerned because patient has had intermitted confusion today. He told her she needed to take him to the hospital, when nurse asked he was oriented time four. Blood pressure (!) 158/72, pulse 60, temperature 98.4 F (36.9 C), temperature source Oral, resp. rate 20, height 6\' 1"  (1.854 m), weight 83.8 kg (184 lb 12.8 oz), SpO2 100 %.  Provider made aware. Will continue to monitor.

## 2017-11-13 NOTE — Progress Notes (Signed)
[facility] NIGHT FLOAT INTERVAL PROGRESS NOTE  Paged by RN at ~10:30pm that patient was more confused than report received from yesterday's nurse and patient refusing nighttime meds and being combative. Went to bedside to evaluate patient.  Upon entering the room, patient was resting comfortably in bed. He denied pain or other complaints. He was alert and oriented x2 (oriented to person and year, but not to place). He stated he thought he was in [place], [place] and that he needed to leave in order to get to [facility] where his doctors could take care of him. When told that he was already at [facility] in [place], [place], he insisted that he was not and that he was in [place]. When asked why he would not take his medications, he stated that he would only take meds that were prescribed by his doctors in [place]. Despite frequent re-orientation that he was in [place], he continued to deny this fact and re-iterated that he was in [place]. We advised to let his nurse know if he needed anything.  Assessment/Plan: Patient was admitted for altered mental status, thought to be secondary to GAS bacteremia from diabetic foot ulcer. Mental status had been improving with antibiotics and supportive care. However, nursing note from earlier today notes that wife was concerned because patient was intermittently confused today. Waxing and waning mental status in setting of hx of dementia in an elderly hospitalized patient is most consistent with delirium. He remains afebrile and is otherwise hemodynamically stable, continuing penicillin G for GAS bacteremia. - Continue to monitor - Delirium precautions - Provide re-orientation if needed, although I am not sure how useful this will be given his insistence on my evaluation that he was not in [place] - If continues to be agitated/combative, can consider haldol 0.5mg  x1  Colbert Ewing, MD Internal Medicine, PGY-1

## 2017-11-14 ENCOUNTER — Encounter (HOSPITAL_COMMUNITY): Payer: Medicare Other

## 2017-11-14 ENCOUNTER — Inpatient Hospital Stay (HOSPITAL_COMMUNITY): Payer: Medicare Other

## 2017-11-14 DIAGNOSIS — R066 Hiccough: Secondary | ICD-10-CM

## 2017-11-14 DIAGNOSIS — F05 Delirium due to known physiological condition: Secondary | ICD-10-CM

## 2017-11-14 LAB — CBC
HCT: 32.3 % — ABNORMAL LOW (ref 39.0–52.0)
Hemoglobin: 11 g/dL — ABNORMAL LOW (ref 13.0–17.0)
MCH: 30 pg (ref 26.0–34.0)
MCHC: 34.1 g/dL (ref 30.0–36.0)
MCV: 88 fL (ref 78.0–100.0)
Platelets: 83 10*3/uL — ABNORMAL LOW (ref 150–400)
RBC: 3.67 MIL/uL — ABNORMAL LOW (ref 4.22–5.81)
RDW: 13.1 % (ref 11.5–15.5)
WBC: 4.2 10*3/uL (ref 4.0–10.5)

## 2017-11-14 LAB — CULTURE, BLOOD (ROUTINE X 2)
SPECIAL REQUESTS: ADEQUATE
Special Requests: ADEQUATE

## 2017-11-14 LAB — BASIC METABOLIC PANEL
ANION GAP: 6 (ref 5–15)
BUN: 29 mg/dL — AB (ref 6–20)
CALCIUM: 8.8 mg/dL — AB (ref 8.9–10.3)
CO2: 24 mmol/L (ref 22–32)
Chloride: 104 mmol/L (ref 101–111)
Creatinine, Ser: 2.12 mg/dL — ABNORMAL HIGH (ref 0.61–1.24)
GFR calc Af Amer: 35 mL/min — ABNORMAL LOW (ref >60)
GFR calc non Af Amer: 30 mL/min — ABNORMAL LOW (ref >60)
GLUCOSE: 149 mg/dL — AB (ref 65–99)
Potassium: 3.5 mmol/L (ref 3.5–5.1)
Sodium: 134 mmol/L — ABNORMAL LOW (ref 135–145)

## 2017-11-14 LAB — GLUCOSE, CAPILLARY
GLUCOSE-CAPILLARY: 189 mg/dL — AB (ref 65–99)
Glucose-Capillary: 133 mg/dL — ABNORMAL HIGH (ref 65–99)
Glucose-Capillary: 168 mg/dL — ABNORMAL HIGH (ref 65–99)
Glucose-Capillary: 156 mg/dL — ABNORMAL HIGH (ref 65–99)

## 2017-11-14 NOTE — Progress Notes (Signed)
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   Head CT revealed no acute intracranial abnormalities, mild chronic microvascular ischemic change, small basal ganglia lacunar infarcts with overall stable appearance from the prior head CT.    PT Comments    Pt up in chair on arrival to room. Reports he is very fatigued and required max encouragement to participate with therapy. Pt has difficulty with motor planning when instructed to scoot forward or stand, and required several single step commands along with tactile cues to complete. Pt required mod A to scoot forward and min A to stand. He ambulated 12 feet with min A for RW management, as he tends to drift L. Pt wife concerned about the amount of assist she can provide at home. Feel that short term stay in a SNF would be appropriate to maximize pt's functional independence and safety with mobility before return home. D/c plan updated and discussed with evaluating PT.    Follow Up Recommendations  SNF     Equipment Recommendations  None recommended by PT    Recommendations for Other Services OT consult     Precautions / Restrictions Precautions Precautions: Fall Restrictions Weight Bearing Restrictions: No    Mobility  Bed Mobility Overal bed mobility: Needs Assistance Bed Mobility: Sit to Supine     Supine to sit: Min assist Sit to supine: Min assist   General bed mobility comments: min A to negotiate LE onto bed. Greater assist required for R LE.   Transfers Overall transfer level: Needs assistance Equipment used: Rolling walker (2 wheeled) Transfers: Sit to/from Stand Sit to Stand: Min assist Stand pivot transfers:  Min assist       General transfer comment: Pt was mod A with commands to scoot forward in chair to prepare for transfer. Once seated at the edge of chair, pt required min A to power up into standing. Cues for tehcnique and hand placement. Slow processing after each command  Ambulation/Gait Ambulation/Gait assistance: Min assist Ambulation Distance (Feet): 12 Feet Assistive device: Rolling walker (2 wheeled) Gait Pattern/deviations: Step-to pattern;Decreased stride length;Shuffle;Drifts right/left Gait velocity: decreased Gait velocity interpretation: Below normal speed for age/gender General Gait Details: Pt with slow measured gait in room, drifts to the L and requires min A for RW management. Frequent cues to avoid obsticles in room.    Stairs            Wheelchair Mobility    Modified Rankin (Stroke Patients Only)       Balance Overall balance assessment: Needs assistance   Sitting balance-Leahy Scale: Fair     Standing balance support: Bilateral upper extremity supported Standing balance-Leahy Scale: Poor                              Cognition Arousal/Alertness: Awake/alert(lethargic in middle of session) Behavior During Therapy: WFL for tasks assessed/performed Overall Cognitive Status: Impaired/Different from baseline Area of Impairment: Following commands;Problem solving                       Following Commands: Follows one step commands inconsistently     Problem Solving: Slow processing;Decreased initiation;Difficulty  sequencing;Requires tactile cues;Requires verbal cues General Comments: Pt requiring very simple single step commands during session today. Difficulty motor planning to scoot forward in chair. Able to progress with multimodal and single step cues.      Exercises      General Comments General comments (skin integrity, edema, etc.): wife present during session and expressing concerns about the amount of assist she can  give at home.       Pertinent Vitals/Pain Pain Assessment: No/denies pain    Home Living Family/patient expects to be discharged to:: Private residence Living Arrangements: Spouse/significant other;Children Available Help at Discharge: Family;Available 24 hours/day Type of Home: House Home Access: Stairs to enter Entrance Stairs-Rails: None Home Layout: Two level;Bed/bath upstairs Home Equipment: Walker - 4 wheels;Cane - single point;Electric scooter      Prior Function Level of Independence: Independent with assistive device(s)      Comments: uses cane ocassionally; independent with ADL   PT Goals (current goals can now be found in the care plan section) Acute Rehab PT Goals Patient Stated Goal: to go home PT Goal Formulation: With patient Time For Goal Achievement: 11/27/17 Potential to Achieve Goals: Good Progress towards PT goals: Progressing toward goals    Frequency    Min 3X/week      PT Plan Discharge plan needs to be updated    Co-evaluation              AM-PAC PT "6 Clicks" Daily Activity  Outcome Measure  Difficulty turning over in bed (including adjusting bedclothes, sheets and blankets)?: A Little Difficulty moving from lying on back to sitting on the side of the bed? : A Lot Difficulty sitting down on and standing up from a chair with arms (e.g., wheelchair, bedside commode, etc,.)?: Unable Help needed moving to and from a bed to chair (including a wheelchair)?: A Little Help needed walking in hospital room?: A Little Help needed climbing 3-5 steps with a railing? : A Lot 6 Click Score: 14    End of Session Equipment Utilized During Treatment: Gait belt Activity Tolerance: Patient limited by fatigue Patient left: with call bell/phone within reach;in bed;with family/visitor present Nurse Communication: Mobility status PT Visit Diagnosis: Other abnormalities of gait and mobility (R26.89);Muscle weakness (generalized) (M62.81)     Time:  0998-3382 PT Time Calculation (min) (ACUTE ONLY): 21 min  Charges:  $Gait Training: 8-22 mins                    G Codes:       Benjiman Core, [place] Pager [phone] Acute Rehab   Allena Katz 11/14/2017, 1:45 PM

## 2017-11-14 NOTE — Progress Notes (Signed)
Patient was calm in bed when approached be became combative, patient is oriented to self and does not know where he is. According to the report from previous nurse, patient is more confused that he was yesterday Patient was continually reoriented to present situation but will not comply.  Patient refused all his night medication only IV antibiotics was administered because he was already connected to the IV at [facility].  IM Resident doctor notified and she came to see patient. Will continue to monitor.

## 2017-11-14 NOTE — Progress Notes (Signed)
Subjective: Cody Silva is a 72 year old male with past medical history of T2DM, HTN, and dementia who presented with fever (104F), tachycardia, tachypnea, and foot callous/wound currently treated with sepsis. Overnight events include increased confusion and combativeness; pt was also refused PO meds. On exam today, pt looked stable and much improved. He was found sitting in his chair eating his meal next to his wife. Pt is alert and speaking in full sentences. The wife reports that the patient has had some abnormal facial profiling following hospitalization. She describes his left face as slightly drooped along with generalized weakness (L>R).   Objective: Vital signs in last 24 hours: Vitals:   11/13/17 2146 11/14/17 0504 11/14/17 0744 11/14/17 0906  BP: (!) 159/83 (!) 193/90  (!) 168/82  Pulse: 63 75  83  Resp: 18 18    Temp: 97.7 F (36.5 C) 97.9 F (36.6 C)    TempSrc: Oral Oral    SpO2: 100% 98% 95%   Weight:      Height:       Weight change:   Intake/Output Summary (Last 24 hours) at 11/14/2017 1440 Last data filed at 11/14/2017 0124 Gross per 24 hour  Intake 700 ml  Output 300 ml  Net 400 ml   BP (!) 168/82   Pulse 83   Temp 97.9 F (36.6 C) (Oral)   Resp 18   Ht 6\' 1"  (1.854 m)   Wt 184 lb 12.8 oz (83.8 kg)   SpO2 95%   BMI 24.38 kg/m  General appearance: alert, no distress and sitting comfortably in chair. Seems mildly delirious. Able to follow instructions through physical exam.  Head: Normocephalic, without obvious abnormality, atraumatic Lungs: clear to auscultation bilaterally Heart: regular rate and rhythm, S1, S2 normal, no murmur, click, rub or gallop Extremities: Callus on lateral aspect of R foot and linear wound at base of toes on plantar surface with associated erythema that is improved from yesterday Pulses: 2+ and symmetric Neurologic: Mental status: Alert, oriented, thought content appropriate Cranial nerves: VII: lower facial muscle  function reduced on the left Sensory: normal Motor: grossly normal  Lab Results: @LABTEST2 @ Micro Results: Recent Results (from the past 240 hour(s))  Blood Culture (routine x 2)     Status: Abnormal   Collection Time: 11/11/17  1:04 PM  Result Value Ref Range Status   Specimen Description BLOOD LEFT ANTECUBITAL  Final   Special Requests   Final    BOTTLES DRAWN AEROBIC AND ANAEROBIC Blood Culture adequate volume   Culture  Setup Time   Final    GRAM POSITIVE COCCI IN CHAINS IN BOTH AEROBIC AND ANAEROBIC BOTTLES CRITICAL VALUE NOTED.  VALUE IS CONSISTENT WITH PREVIOUSLY REPORTED AND CALLED VALUE.    Culture (A)  Final    GROUP A STREP (S.PYOGENES) ISOLATED SUSCEPTIBILITIES PERFORMED ON PREVIOUS CULTURE WITHIN THE LAST 5 DAYS. Performed at [facility], [place] [address] [place], [place] [address]    Report Status 11/14/2017 FINAL  Final  Blood Culture (routine x 2)     Status: Abnormal   Collection Time: 11/11/17  1:24 PM  Result Value Ref Range Status   Specimen Description BLOOD RIGHT ANTECUBITAL  Final   Special Requests   Final    BOTTLES DRAWN AEROBIC AND ANAEROBIC Blood Culture adequate volume   Culture  Setup Time   Final    GRAM POSITIVE COCCI IN CHAINS IN BOTH AEROBIC AND ANAEROBIC BOTTLES CRITICAL RESULT CALLED TO, READ BACK BY AND VERIFIED WITH:  A.MASTERS,PHARMD AT 0348 ON 11/12/17 BY G.MCADOO    Culture (A)  Final    GROUP A STREP (S.PYOGENES) ISOLATED HEALTH DEPARTMENT NOTIFIED Performed at [facility], [place] [address] [place], [place] [address]    Report Status 11/14/2017 FINAL  Final   Organism ID, Bacteria GROUP A STREP (S.PYOGENES) ISOLATED  Final      Susceptibility   Group a strep (s.pyogenes) isolated - MIC*    PENICILLIN <=0.06 SENSITIVE Sensitive     CEFTRIAXONE <=0.12 SENSITIVE Sensitive     ERYTHROMYCIN <=0.12 SENSITIVE Sensitive     LEVOFLOXACIN 0.5 SENSITIVE Sensitive     VANCOMYCIN <=0.12 SENSITIVE Sensitive     * GROUP A STREP  (S.PYOGENES) ISOLATED  Blood Culture ID Panel (Reflexed)     Status: Abnormal   Collection Time: 11/11/17  1:24 PM  Result Value Ref Range Status   Enterococcus species NOT DETECTED NOT DETECTED Final   Listeria monocytogenes NOT DETECTED NOT DETECTED Final   Staphylococcus species NOT DETECTED NOT DETECTED Final   Staphylococcus aureus NOT DETECTED NOT DETECTED Final   Streptococcus species DETECTED (A) NOT DETECTED Final    Comment: CRITICAL RESULT CALLED TO, READ BACK BY AND VERIFIED WITH: A.MASTERS,PHARMD AT 0348 ON 11/12/17 BY G.MCADOO    Streptococcus agalactiae NOT DETECTED NOT DETECTED Final   Streptococcus pneumoniae NOT DETECTED NOT DETECTED Final   Streptococcus pyogenes DETECTED (A) NOT DETECTED Final    Comment: CRITICAL RESULT CALLED TO, READ BACK BY AND VERIFIED WITH: A.MASTERS,PHARMD AT 7867 ON 11/12/17 BY G.MCADOO    Acinetobacter baumannii NOT DETECTED NOT DETECTED Final   Enterobacteriaceae species NOT DETECTED NOT DETECTED Final   Enterobacter cloacae complex NOT DETECTED NOT DETECTED Final   Escherichia coli NOT DETECTED NOT DETECTED Final   Klebsiella oxytoca NOT DETECTED NOT DETECTED Final   Klebsiella pneumoniae NOT DETECTED NOT DETECTED Final   Proteus species NOT DETECTED NOT DETECTED Final   Serratia marcescens NOT DETECTED NOT DETECTED Final   Haemophilus influenzae NOT DETECTED NOT DETECTED Final   Neisseria meningitidis NOT DETECTED NOT DETECTED Final   Pseudomonas aeruginosa NOT DETECTED NOT DETECTED Final   Candida albicans NOT DETECTED NOT DETECTED Final   Candida glabrata NOT DETECTED NOT DETECTED Final   Candida krusei NOT DETECTED NOT DETECTED Final   Candida parapsilosis NOT DETECTED NOT DETECTED Final   Candida tropicalis NOT DETECTED NOT DETECTED Final    Comment: Performed at [facility], [place] [address] [place], [place] [address]   Studies/Results: Ct Head Wo Contrast  Result Date: 11/14/2017 CLINICAL DATA:  Focal neuro deficit,  left-sided weakness.  Stroke EXAM: CT HEAD WITHOUT CONTRAST TECHNIQUE: Contiguous axial images were obtained from the base of the skull through the vertex without intravenous contrast. COMPARISON:  CT head 11/11/2017 FINDINGS: Brain: Generalized atrophy unchanged. Chronic microvascular ischemic change throughout the white matter. Chronic lacunar infarctions in the basal ganglia on the left. Negative for acute infarct, hemorrhage, or mass lesion. Vascular: Negative for hyperdense vessel Skull: Negative Sinuses/Orbits: Negative Other: None IMPRESSION: Atrophy and chronic microvascular ischemia.  No acute abnormality. Electronically Signed   By: Franchot Gallo M.D.   On: 11/14/2017 14:02   Mr Foot Right Wo Contrast  Result Date: 11/12/2017 CLINICAL DATA:  72 year old diabetic with foot swelling. Question osteomyelitis. EXAM: MRI OF THE RIGHT FOREFOOT WITHOUT CONTRAST TECHNIQUE: Multiplanar, multisequence MR imaging of the right forefoot was performed. No intravenous contrast was administered. COMPARISON:  None. FINDINGS: Bones/Joint/Cartilage No acute fracture or frank bone  destruction. Osteoarthritic joint space narrowing of the first MTP with minimal spurring. Lesser degrees of mild joint space narrowing is seen of the DIP and PIP joints of the second through fifth digits. No cortical bone destruction is identified. Probable reactive edema involving the great through third toes from approximately the heads of the proximal phalanges distally. No focal chondromalacia. There is hallux valgus of the great toe. Ligaments Negative Muscles and Tendons No evidence of pyomyositis. The extensor and flexor tendons are maintained without apparent tenosynovitis. Soft tissues Mild-to-moderate generalized subcutaneous and intramuscular edema. IMPRESSION: 1. No conclusive evidence for acute osteomyelitis. Probable mild reactive edema secondary to osteoarthritic change of first through third digits. No frank bone destruction is  identified. 2. Generalized soft tissue edema of the forefoot without focal fluid collection or abscess. Electronically Signed   By: Ashley Royalty M.D.   On: 11/12/2017 15:33   Medications: I have reviewed the patient's current medications. Scheduled Meds: . atorvastatin  40 mg Oral q1800  . buPROPion  75 mg Oral Daily  . carvedilol  25 mg Oral BID WC  . cloNIDine  0.2 mg Transdermal Weekly  . clopidogrel  75 mg Oral QHS  . dorzolamide-timolol  1 drop Both Eyes BID  . feeding supplement (ENSURE ENLIVE)  237 mL Oral BID PC  . hydrocerin   Topical BID  . insulin aspart  0-5 Units Subcutaneous QHS  . insulin aspart  0-9 Units Subcutaneous TID WC  . latanoprost  1 drop Both Eyes QHS  . mometasone-formoterol  2 puff Inhalation BID  . montelukast  10 mg Oral QHS  . sertraline  100 mg Oral Daily  . tamsulosin  0.8 mg Oral QHS   Continuous Infusions: . pencillin G potassium IV Stopped (11/14/17 1315)   PRN Meds:.acetaminophen, acetaminophen, hydrALAZINE, hydrALAZINE, Melatonin, ondansetron (ZOFRAN) IV, polyethylene glycol, polyvinyl alcohol Assessment/Plan: Principal Problem:   Sepsis ([place]) Active Problems:   Diabetes mellitus ([place])   Hypertension   History of CVA (cerebrovascular accident)   Depression   AKI (acute kidney injury) ([place])   CKD stage 3 due to type 2 diabetes mellitus ([place])   Acute sepsis ([place])   Scrotal pain   Wound eschar of foot  Cody Silva is a 72 year old male with past medical history of T2DM, HTN, and dementia who is being treated for GAS bacteremia following admission for altered behavior and fever.   #GAS Bacteremia. IV Penicillin G Day 2. Afebrile overnight and hemodynamically stable. WBCs continue to trend down. Source is likely right foot due to ulceration and prior wounds (sequelae to T2DM). Right foot MRI shows no evidence of acute osteomyelitis. - IV Penicillin G q4hrs - Repeat Blood cx - PT/OT to follow-up, appreciate assistance  #Scrotal  Pain. Continues to complain of bilateral testicular pain with palpation and also light touch. Scrotal u/s grossly unremarkable other than microcalcifications in left testicle and small cyst or spermatocele in left epididymis.  - Continue to monitor  #Thrombocytopenia. Chronic issue unclear if worked up in the past through [place]. Plts were down-trending recently from 20 >> 72. Found to be 83 on most recent CBC. Will continue to monitor.  - CBC in AM - D/c SQ Heparin if Plts < 50  # HTN. Hypertensive in setting of volume resuscitation.  Has IV Hydralazine PRN ordered.  We will continue to monitor. - Continue home Coreg 25 mg BID - Continue home clonidine patch 0.2mg   - Holding home chlorthalidone in the setting of AKI   #  T2DM. Patient was insulin dependent in the past. Per wife, he lost 30 lbs in 3 months in the recent past. He was worked up for this at the [place] with no etiology found. Since then, his DM has been well controlled and he has not required insulin. His last A1c is 5.5, unclear when this was obtained. CBGs at goal.  - SSI-S + HS scale  - CBG monitoring  - Follow up Vascular ABIs  #AKI on CKD. Renal fxn stable. Cr 2.12. Continue to monitor.  - Monitor electrolytes - Avoid nephrotoxic agents - Repeat BMP  #History of CVA. Multiple CVAs in the past. Most recent CVA in 08/2017 resulting in residual R-sided deficits. Follows up with [facility] Neurology. - Continue home Plavix 75 mg QD - Continue home Atorvastatin 40 mg QD  #Depression - Continue home Buproprion 75mg  QD - Continue home Sertraline 100mg  QD  #?BPH - Continue home Tamsulosin 0.4mg  QD   Disposition: Anticipated discharge in approximately 2-3 days pending improvement in clinical status and completion of workup.   This is a Careers information officer Note.  The care of the patient was discussed with Dr. Isac Sarna and the assessment and plan formulated with their assistance.  Please see their attached note for official  documentation of the daily encounter.   LOS: 2 days   Toy Cookey, Medical Student 11/14/2017, 2:40 PM

## 2017-11-14 NOTE — Progress Notes (Signed)
Prn hydralazine iv 5mg  given at 0635  for BP 193/90.

## 2017-11-14 NOTE — Progress Notes (Addendum)
Subjective:  Overnight patient became confused, combative, and refused to take oral medications. He was evaluated by night team at which time he was alert and oriented x2. Delirium precautions in place and patient re-oriented several times. This morning patient was sitting up in chair eating breakfast. He did not voice any complaints. Continues to have hiccups. Wife at bedside. She expresses concern about possible stroke as she has noticed a new L facial droop. States he has a stroke in the past with residual R facial droop as well as UE and LE weakness. She also expressed concern about patient's altered mentation throughout the night. Discussed with patient will order head CT for further evaluation. She was educated on delirium precaution which she stated she was familiar with as she went through a similar situation with her mother. Also discussed will continue to treat GAS bacteremia with IV penicillin and repeat blood cultures pending. Wife verbalized understanding and is in agreement with plan. All questions answered.   Objective:  Vital signs in last 24 hours: Vitals:   11/13/17 2146 11/14/17 0504 11/14/17 0744 11/14/17 0906  BP: (!) 159/83 (!) 193/90  (!) 168/82  Pulse: 63 75  83  Resp: 18 18    Temp: 97.7 F (36.5 C) 97.9 F (36.6 C)    TempSrc: Oral Oral    SpO2: 100% 98% 95%   Weight:      Height:       Physical Exam  Constitutional: He is oriented to person, place, and time.  Elderly, well-nourished, well-developed male sitting up in chair eating breakfast in no acute distress.   Pulmonary/Chest:  Appears comfortable on room air and able to speak in full sentences   Musculoskeletal: He exhibits no edema.  Neurological: He is alert and oriented to person, place, and time. No cranial nerve deficit. He exhibits abnormal muscle tone (L foot spastic and difficult to test for strength ).  No sensory deficits    Assessment/Plan:  Principal Problem:   Sepsis ([place]) Active  Problems:   Diabetes mellitus ([place])   Hypertension   History of CVA (cerebrovascular accident)   Depression   AKI (acute kidney injury) ([place])   CKD stage 3 due to type 2 diabetes mellitus ([place])   Acute sepsis ([facility])   Scrotal pain   Wound eschar of foot  Cody Silva is a 72 yo male with PMH of N2DP complicated by diabetic retinopathy, CKD, and CVA on 08/2017 with residual R sided deficits who was brought in by spouse due to altered behaviorand fever.  # Sepsis secondary to GAS Bacteremia: On IV penicillin G Day 2. Remains afebrile and hemodynamically stable. Repeat Bcx negative at 24 hours. Leukocytosis resolved. Source of bacteremia likely right foot.  Right foot MRI without evidence suggestive of acute osteomyelitis.  We will continue antibiotic therapy as below. - IV Penicillin G q4h - Day 2 - PT/OT  # Acute delirium secondary to sepsis: Patient became agitated and confused overnight. He was redirectable and did not require medications. Wife is concern about possible stroke as she expresses new L facial droop. Head CT negative for acute intracranial process. about Family counseled about delirium precautions. Will continue to monitor.  - Delirium precautions   # Scrotal pain: He continues to complain of bilateral testicular pain on palpation. Scrotal US with bilateral hydroceles and microcalcifications in left epididymis, but otherwise unremarkable. - Will continue to monitor   # Thrombocytopenia: This problem is chronic. Unclear if he has been worked up  for this as he is a [place] patient and records not available to review. Plt 104 on admission, now 80. He is on SQ heparin for DVT prophylaxis with no signs/symptoms of bleeding.  We will continue to monitor.  - D/c SQ heparin  # HTN: Hypertensive this morning with BP 193/90 after refusing to take his BP medications overnight. BP much improved, 142/76, after administration of home meds.  - Continue home Coreg 25 mg BID - Continue  home clonidine patch 0.2mg   - Holding home chlorthalidone in the setting of AKI   # T2DM: Patient was insulin dependent in the past. Per wife, he lost 30 lbs in 3 months in the recent past. He was worked up for this at the [place] with no etiology found. Since then, his DM has been well controlled and he has not required insulin. His last A1c is 5.5, unclear when this was obtained. CBGs at goal.  - SSI-S + HS scale  - CBG monitoring  - Follow up Vascular ABIs  # AKI on CKD: Renal function now at baseline, Cr 2.1.   - Avoid nephrotoxic agents   # History of CVA: Multiple CVAs in the past. Most recent CVA in 08/2017 in residual R-sided deficits. Follows up with [facility] Neurology.  - Continue home Plavix 75 mg QD  - Continue home atorvastatin 40 mg QD   # ?Depression: Patient med list medication includes Wellbutrin and Zoloft.  - Continue home Bupropion 75 mg QD - Continue home Zoloft 100 mg QD   # ?BPH: - Continue home Tamsulosin 0.4 mg QD    Dispo: Anticipated discharge in approximately 1-3 day(s).   Welford Roche, MD 11/14/2017, 11:45 AM Pager: [phone]

## 2017-11-14 NOTE — Evaluation (Addendum)
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   Head CT revealed no acute intracranial abnormalities, mild chronic microvascular ischemic change, small basal ganglia lacunar infarcts with overall stable appearance from the prior head CT.   Clinical Impression   PTA, pt's wife reports that he was able to complete basic ADL and functional mobility at modified independent level using RW at times. Pt currently limited by decreased motor planning skills, decreased ability to follow commands, generalized weakness, and decreased activity tolerance for ADL. He additionally reported lightheadedness during movement tasks today. He requires max assist for LB ADL, min assist for stand-pivot toilet transfers, and mod assist for toileting hygiene. He would benefit from continued OT services while admitted to maximize independence and safeyt with ADL and functional mobility. At current functional level, recommend short term SNF placement to maximize safety and independence with ADL and functional mobility.    Follow Up Recommendations  Supervision/Assistance - 24 hour;SNF    Equipment Recommendations  3 in 1 bedside commode    Recommendations for Other Services       Precautions / Restrictions Precautions Precautions: Fall Restrictions Weight Bearing Restrictions: No      Mobility Bed Mobility Overal bed mobility: Needs Assistance Bed Mobility: Supine to Sit     Supine to sit: Min assist     General bed mobility comments: Assist with initiative L LE movement, simple/one-step commands required.   Transfers Overall transfer level: Needs assistance Equipment used: Rolling walker (2  wheeled) Transfers: Sit to/from [facility] Sit to Stand: From elevated surface;Min assist Stand pivot transfers: Min assist       General transfer comment: Min assist to power up and maintain stability during pivot.     Balance Overall balance assessment: Needs assistance   Sitting balance-Leahy Scale: Fair     Standing balance support: Bilateral upper extremity supported Standing balance-Leahy Scale: Poor                             ADL either performed or assessed with clinical judgement   ADL Overall ADL's : Needs assistance/impaired Eating/Feeding: Sitting;Supervision/ safety   Grooming: Supervision/safety;Sitting   Upper Body Bathing: Minimal assistance;Sitting   Lower Body Bathing: Maximal assistance;Sit to/from stand   Upper Body Dressing : Minimal assistance;Sitting   Lower Body Dressing: Maximal assistance;Sit to/from stand   Toilet Transfer: Minimal assistance;Stand-pivot;RW   Toileting- Clothing Manipulation and Hygiene: Moderate assistance;Sit to/from stand       Functional mobility during ADLs: Minimal assistance;Rolling walker(stand-pivot only) General ADL Comments: Pt requiring simple one-step commands and multi-modal cues.      Vision Baseline Vision/History: Legally blind Patient Visual Report: No change from baseline Vision Assessment?: No apparent visual deficits     Perception     Praxis      Pertinent Vitals/Pain Pain Assessment: No/denies pain     Hand Dominance Right   Extremity/Trunk Assessment Upper Extremity Assessment Upper Extremity Assessment: Overall WFL for tasks assessed   Lower Extremity Assessment Lower Extremity Assessment: Defer to PT evaluation       Communication Communication Communication: No difficulties   Cognition Arousal/Alertness: Awake/alert(lethargic in middle of session) Behavior  During Therapy: WFL for tasks assessed/performed Overall Cognitive Status:  Impaired/Different from baseline Area of Impairment: Following commands;Problem solving                       Following Commands: Follows one step commands inconsistently     Problem Solving: Slow processing;Decreased initiation;Difficulty sequencing;Requires tactile cues;Requires verbal cues General Comments: Pt requiring very simple single step commands during session today. Difficulty motor planning when asked, "move your body to sit at the edge of bed." However, with multimodal and single step cues was able to progress to EOB.    General Comments  Wife present during session and reports concerns over taking care of pt at home. She reports they will be able to convert dining room into bedroom on main floor if needed.     Exercises     Shoulder Instructions      Home Living Family/patient expects to be discharged to:: Private residence Living Arrangements: Spouse/significant other;Children Available Help at Discharge: Family;Available 24 hours/day Type of Home: House Home Access: Stairs to enter [employer] of Steps: 2 Entrance Stairs-Rails: None Home Layout: Two level;Bed/bath upstairs Alternate Level Stairs-Number of Steps: flight Alternate Level Stairs-Rails: Left Bathroom Shower/Tub: Occupational psychologist: Standard     Home Equipment: Environmental consultant - 4 wheels;Cane - single point;Electric scooter          Prior Functioning/Environment Level of Independence: Independent with assistive device(s)        Comments: uses cane ocassionally; independent with ADL        OT Problem List: Decreased strength;Decreased activity tolerance;Impaired balance (sitting and/or standing);Decreased safety awareness;Decreased knowledge of use of DME or AE;Decreased knowledge of precautions;Pain      OT Treatment/Interventions: Self-care/ADL training;Therapeutic exercise;Energy conservation;DME and/or AE instruction;Therapeutic activities;Patient/family  education;Balance training    OT Goals(Current goals can be found in the care plan section) Acute Rehab OT Goals Patient Stated Goal: to go home OT Goal Formulation: With patient/family Time For Goal Achievement: 11/28/17 Potential to Achieve Goals: Good ADL Goals Pt Will Perform Grooming: with supervision;standing Pt Will Perform Lower Body Dressing: with supervision;sit to/from stand Pt Will Transfer to Toilet: ambulating;bedside commode;with min guard assist Pt Will Perform Toileting - Clothing Manipulation and hygiene: with supervision;sit to/from stand Additional ADL Goal #1: Pt will follow multi-step commands with no more than 2 verbal or tactile cues during grooming tasks.  OT Frequency: Min 2X/week   Barriers to D/C:            Co-evaluation              AM-PAC PT "6 Clicks" Daily Activity     Outcome Measure Help from another person eating meals?: A Little Help from another person taking care of personal grooming?: A Little Help from another person toileting, which includes using toliet, bedpan, or urinal?: A Lot Help from another person bathing (including washing, rinsing, drying)?: A Lot Help from another person to put on and taking off regular upper body clothing?: A Little Help from another person to put on and taking off regular lower body clothing?: A Lot 6 Click Score: 15   End of Session Equipment Utilized During Treatment: Gait belt;Rolling walker  Activity Tolerance: Patient tolerated treatment well Patient left: in chair;with call bell/phone within reach;with chair alarm set;with family/visitor present  OT Visit Diagnosis: Other abnormalities of gait and mobility (R26.89);Muscle weakness (generalized) (M62.81)  Time: 7425-5258 OT Time Calculation (min): 29 min Charges:  OT General Charges $OT Visit: 1 Visit OT Evaluation $OT Eval Moderate Complexity: 1 Mod OT Treatments $Self Care/Home Management : 8-22 mins G-Codes:     Norman Herrlich, MS OTR/L  Pager: Cody Silva 11/14/2017, 12:58 PM

## 2017-11-14 NOTE — Plan of Care (Signed)
  Progressing Education: Knowledge of General Education information will improve 11/14/2017 2008 - Progressing by Verne Grain, RN Activity: Risk for activity intolerance will decrease 11/14/2017 2008 - Progressing by Verne Grain, RN Safety: Ability to remain free from injury will improve 11/14/2017 2008 - Progressing by Verne Grain, RN  Continue to reorient patient as needed  Skin Integrity: Risk for impaired skin integrity will decrease 11/14/2017 2008 - Progressing by Verne Grain, RN Dressing change completed as ordered

## 2017-11-14 NOTE — Clinical Social Work Note (Signed)
Clinical Social Work Assessment  Patient Details  Name: Cody Silva MRN: [id number] Date of Birth: 1946-01-23  Date of referral:  11/14/17               Reason for consult:  Facility Placement                Permission sought to share information with:  Facility Sport and exercise psychologist, Family Supports Permission granted to share information::  Yes, Verbal Permission Granted  Name::     Freda Munro  Agency::  SNFs  Relationship::  Spouse  Contact Information:  [phone]  Housing/Transportation Living arrangements for the past 2 months:  Single Family Home Source of Information:  Spouse Patient Interpreter Needed:  None Criminal Activity/Legal Involvement Pertinent to Current Situation/Hospitalization:  No - Comment as needed Significant Relationships:  Adult Children, Spouse Lives with:  Spouse Do you feel safe going back to the place where you live?  No Need for family participation in patient care:  Yes (Comment)  Care giving concerns:  CSW received consult for possible SNF placement at time of discharge. CSW spoke with patient's spouse and patient (but patient slightly disoriented) regarding PT recommendation of SNF placement at time of discharge. Patient's spouse reported that patient's spouse is currently unable to care for patient at their home given patient's current physical needs and fall risk. Patient's spouse expressed understanding of PT recommendation and is agreeable to SNF placement at time of discharge. CSW to continue to follow and assist with discharge planning needs.   Social Worker assessment / plan:  CSW spoke with patient's spouse concerning possibility of rehab at [facility] before returning home.  Employment status:  Retired Nurse, adult PT Recommendations:  [place] / Referral to community resources:  [place]  Patient/Family's Response to care:  Patient's spouse requesting placement  through the [place]. CSW explained that it is often difficult to get a VA placement but will try. Otherwise, patient will need to discharge to a local SNF through his Medicare. Patient's spouse requested Chaplain to complete HCPOA paperwork.  Patient/Family's Understanding of and Emotional Response to Diagnosis, Current Treatment, and Prognosis:  Patient/family is realistic regarding therapy needs and expressed being hopeful for SNF placement. Patient expressed understanding of CSW role and discharge process as well as medical condition. No questions/concerns about plan or treatment.    Emotional Assessment Appearance:  Appears stated age Attitude/Demeanor/Rapport:  Unable to Assess Affect (typically observed):  Unable to Assess Orientation:  (Disoriented) Alcohol / Substance use:  Not Applicable Psych involvement (Current and /or in the community):  No (Comment)  Discharge Needs  Concerns to be addressed:  Care Coordination Readmission within the last 30 days:  No Current discharge risk:  Cognitively Impaired, Dependent with Mobility Barriers to Discharge:  Continued Medical Work up   [employer], [place] 11/14/2017, 3:32 PM

## 2017-11-14 NOTE — NC FL2 (Signed)
[place] MEDICAID FL2 LEVEL OF CARE SCREENING TOOL     IDENTIFICATION  Patient Name: Cody Silva Birthdate: March 30, 1946 Sex: male Admission Date (Current Location): 11/11/2017  [facility] and [place] Number:  Herbalist and Address:  [facility] [place], [place] [address], [place], [place] [address]      Provider Number: [id number]  Attending Physician Name and Address:  Lucious Groves, DO  Relative Name and Phone Number:       Current Level of Care: Hospital Recommended Level of Care: [place] Prior Approval Number:    Date Approved/Denied:   PASRR Number: [id number]  Discharge Plan: SNF    Current Diagnoses: Patient Active Problem List   Diagnosis Date Noted  . Wound eschar of foot   . Acute sepsis ([place]) 11/12/2017  . Scrotal pain   . Sepsis ([place]) 11/11/2017  . Atrophy of muscle of right hand 04/04/2017  . Protein-calorie malnutrition, severe 01/19/2017  . Altered mental status 01/09/2017  . Facial droop 01/09/2017  . Cerebral thrombosis with cerebral infarction 01/09/2017  . Seizures ([place])   . Other hyperlipidemia   . OSA (obstructive sleep apnea) 09/12/2016  . Weight loss 09/12/2016  . Malnutrition of moderate degree 09/11/2016  . Syncope 09/10/2016  . H/O agent Orange exposure 12/24/2015  . Type 2 diabetes with nephropathy ([place])   . Near syncope 12/22/2015  . History of TIAs 09/14/2015  . CKD stage 3 due to type 2 diabetes mellitus ([place]) 09/14/2015  . Acute lower GI bleeding 10/30/2014  . History of colonic polyps 10/30/2014  . Depression 10/30/2014  . Symptomatic anemia 10/30/2014  . AKI (acute kidney injury) ([place]) 10/30/2014  . Ataxic gait 09/21/2014  . History of CVA (cerebrovascular accident) 02/22/2013  . Abnormal brain scan 02/21/2013  . Dizziness 02/21/2013  . Weakness 02/21/2013  . Diabetes mellitus ([place]) 02/21/2013  . Hypertension 02/21/2013    Orientation RESPIRATION BLADDER Height & Weight         Normal Incontinent, External catheter Weight: 184 lb 12.8 oz (83.8 kg) Height:  6\' 1"  (185.4 cm)  BEHAVIORAL SYMPTOMS/MOOD NEUROLOGICAL BOWEL NUTRITION STATUS      Continent Diet(see DC summary)  AMBULATORY STATUS COMMUNICATION OF NEEDS Skin   Limited Assist Verbally Normal                       Personal Care Assistance Level of Assistance  Bathing, Dressing Bathing Assistance: Limited assistance   Dressing Assistance: Limited assistance     Functional Limitations Info             SPECIAL CARE FACTORS FREQUENCY  PT (By licensed PT), OT (By licensed OT)     PT Frequency: 5/wk OT Frequency: 5/wk            Contractures      Additional Factors Info  Code Status, Allergies, Psychotropic Code Status Info: FULL Allergies Info: NKA Psychotropic Info: wellbutrin, zoloft         Current Medications (11/14/2017):  This is the current hospital active medication list Current Facility-Administered Medications  Medication Dose Route Frequency Provider Last Rate Last Dose  . acetaminophen (TYLENOL) suppository 650 mg  650 mg Rectal Q4H PRN Alphonzo Grieve, MD   650 mg at 11/11/17 1719  . acetaminophen (TYLENOL) tablet 650 mg  650 mg Oral Q6H PRN Colbert Ewing, MD   650 mg at 11/12/17 2140  . atorvastatin (LIPITOR) tablet 40 mg  40 mg Oral q1800  Alphonzo Grieve, MD   40 mg at 11/13/17 1720  . buPROPion [facility]) tablet 75 mg  75 mg Oral Daily Alphonzo Grieve, MD   75 mg at 11/14/17 0906  . carvedilol (COREG) tablet 25 mg  25 mg Oral BID WC Alphonzo Grieve, MD   25 mg at 11/14/17 0906  . cloNIDine (CATAPRES - Dosed in mg/24 hr) patch 0.2 mg  0.2 mg Transdermal Weekly Alphonzo Grieve, MD   0.2 mg at 11/11/17 1914  . clopidogrel (PLAVIX) tablet 75 mg  75 mg Oral QHS Alphonzo Grieve, MD   75 mg at 11/12/17 2140  . dorzolamide-timolol (COSOPT) 22.3-6.8 MG/ML ophthalmic solution 1 drop  1 drop Both Eyes BID Alphonzo Grieve, MD   1 drop at 11/14/17 0902  . feeding  supplement (ENSURE ENLIVE) (ENSURE ENLIVE) liquid 237 mL  237 mL Oral BID PC Svalina, Gorica, MD   237 mL at 11/14/17 0900  . hydrALAZINE (APRESOLINE) injection 5 mg  5 mg Intravenous Q6H PRN Nedrud, Larena Glassman, MD   5 mg at 11/14/17 6045  . hydrALAZINE (APRESOLINE) tablet 10 mg  10 mg Oral Q6H PRN Alphonzo Grieve, MD   10 mg at 11/12/17 1021  . hydrocerin (EUCERIN) cream   Topical BID Joni Reining C, DO      . insulin aspart (novoLOG) injection 0-5 Units  0-5 Units Subcutaneous QHS Svalina, Gorica, MD      . insulin aspart (novoLOG) injection 0-9 Units  0-9 Units Subcutaneous TID WC Alphonzo Grieve, MD   2 Units at 11/14/17 1245  . latanoprost (XALATAN) 0.005 % ophthalmic solution 1 drop  1 drop Both Eyes QHS Alphonzo Grieve, MD   1 drop at 11/13/17 0016  . Melatonin TABS 3 mg  3 mg Oral BID BM & HS PRN Alphonzo Grieve, MD   3 mg at 11/13/17 0026  . mometasone-formoterol (DULERA) 100-5 MCG/ACT inhaler 2 puff  2 puff Inhalation BID Alphonzo Grieve, MD   2 puff at 11/14/17 0742  . montelukast (SINGULAIR) tablet 10 mg  10 mg Oral QHS Alphonzo Grieve, MD   10 mg at 11/12/17 2139  . ondansetron (ZOFRAN) injection 4 mg  4 mg Intravenous Q8H PRN Welford Roche, MD   4 mg at 11/13/17 1040  . penicillin G potassium 3 Million Units in dextrose 5 % 100 mL IVPB  3 Million Units Intravenous Q4H Maryellen Pile, MD   Stopped at 11/14/17 1315  . polyethylene glycol (MIRALAX / GLYCOLAX) packet 17 g  17 g Oral Daily PRN Alphonzo Grieve, MD      . polyvinyl alcohol (LIQUIFILM TEARS) 1.4 % ophthalmic solution 1 drop  1 drop Both Eyes QID PRN Susa Raring, RPH      . sertraline (ZOLOFT) tablet 100 mg  100 mg Oral Daily Alphonzo Grieve, MD   100 mg at 11/14/17 0906  . tamsulosin (FLOMAX) capsule 0.8 mg  0.8 mg Oral QHS Alphonzo Grieve, MD   0.8 mg at 11/12/17 2139     Discharge Medications: Please see discharge summary for a list of discharge medications.  Relevant Imaging Results:  Relevant Lab  Results:   Additional Information SS#: [id number]  Jorge Ny, LCSW

## 2017-11-15 ENCOUNTER — Encounter (HOSPITAL_COMMUNITY): Payer: Medicare Other

## 2017-11-15 DIAGNOSIS — N5082 Scrotal pain: Secondary | ICD-10-CM

## 2017-11-15 DIAGNOSIS — F339 Major depressive disorder, recurrent, unspecified: Secondary | ICD-10-CM

## 2017-11-15 DIAGNOSIS — Z87898 Personal history of other specified conditions: Secondary | ICD-10-CM

## 2017-11-15 LAB — CBC
HEMATOCRIT: 31.7 % — AB (ref 39.0–52.0)
HEMOGLOBIN: 10.8 g/dL — AB (ref 13.0–17.0)
MCH: 29.7 pg (ref 26.0–34.0)
MCHC: 34.1 g/dL (ref 30.0–36.0)
MCV: 87.1 fL (ref 78.0–100.0)
Platelets: 104 10*3/uL — ABNORMAL LOW (ref 150–400)
RBC: 3.64 MIL/uL — ABNORMAL LOW (ref 4.22–5.81)
RDW: 13 % (ref 11.5–15.5)
WBC: 3.9 10*3/uL — ABNORMAL LOW (ref 4.0–10.5)

## 2017-11-15 LAB — BASIC METABOLIC PANEL
ANION GAP: 9 (ref 5–15)
BUN: 31 mg/dL — ABNORMAL HIGH (ref 6–20)
CALCIUM: 8.7 mg/dL — AB (ref 8.9–10.3)
CO2: 23 mmol/L (ref 22–32)
Chloride: 103 mmol/L (ref 101–111)
Creatinine, Ser: 2.21 mg/dL — ABNORMAL HIGH (ref 0.61–1.24)
GFR, EST AFRICAN AMERICAN: 33 mL/min — AB (ref >60)
GFR, EST NON AFRICAN AMERICAN: 28 mL/min — AB (ref >60)
GLUCOSE: 136 mg/dL — AB (ref 65–99)
POTASSIUM: 3.6 mmol/L (ref 3.5–5.1)
SODIUM: 135 mmol/L (ref 135–145)

## 2017-11-15 LAB — GLUCOSE, CAPILLARY
GLUCOSE-CAPILLARY: 170 mg/dL — AB (ref 65–99)
Glucose-Capillary: 138 mg/dL — ABNORMAL HIGH (ref 65–99)
Glucose-Capillary: 152 mg/dL — ABNORMAL HIGH (ref 65–99)
Glucose-Capillary: 192 mg/dL — ABNORMAL HIGH (ref 65–99)

## 2017-11-15 MED ORDER — CHLORPROMAZINE HCL 25 MG PO TABS
25.0000 mg | ORAL_TABLET | Freq: Three times a day (TID) | ORAL | Status: AC
  Administered 2017-11-15 – 2017-11-16 (×3): 25 mg via ORAL
  Filled 2017-11-15 (×3): qty 1

## 2017-11-15 NOTE — Progress Notes (Signed)
CSW spoke with patient's spouse at bedside. She would now like to pursue a local SNF instead of through the [place]. Her first preference is [facility]. CSW left them a voicemail.   Cody [name] LCSW [phone]

## 2017-11-15 NOTE — Progress Notes (Signed)
Subjective:  No acute events overnight. Afebrile and hemodynamically stable. Doing overall well. Complaining of hiccups. Reports scrotal pain improved. Wife present at bedside. Discussed ongoing antibiotic therapy for bacteremia and discharge to SNF when bed available. They voiced understanding and are in agreement with plan. All questions answered.   Objective:  Vital signs in last 24 hours: Vitals:   11/15/17 0600 11/15/17 0859 11/15/17 1344 11/15/17 1700  BP: (!) 188/90 (!) 173/87 (!) 161/81 (!) 187/86  Pulse: 70 70 70 68  Resp: 18  20   Temp: 98.4 F (36.9 C)  98.4 F (36.9 C)   TempSrc: Oral  Oral   SpO2: 100%  100%   Weight:      Height:       Physical Exam  Constitutional: He is oriented to person, place, and time. He appears well-developed and well-nourished. No distress.  Cardiovascular: Normal rate, regular rhythm and normal heart sounds. Exam reveals no gallop and no friction rub.  No murmur heard. Pulmonary/Chest: Effort normal. No respiratory distress.  Abdominal: Soft. Bowel sounds are normal. He exhibits no distension. There is no tenderness.  Musculoskeletal: He exhibits no edema.  Neurological: He is alert and oriented to person, place, and time.    Assessment/Plan:  Principal Problem:   Sepsis ([place]) Active Problems:   Diabetes mellitus ([place])   Hypertension   History of CVA (cerebrovascular accident)   Depression   AKI (acute kidney injury) ([place])   CKD stage 3 due to type 2 diabetes mellitus ([place])   Acute sepsis (H[facility])   Scrotal pain   Wound eschar of foot   Streptococcal bacteremia  # Sepsis secondary to GAS Bacteremia:On IV penicillin GDay 3.Remains afebrile and hemodynamically stable. Repeat Bcx negative at 48 hours. Leukocytosis resolved. Source of bacteremia likely right foot. Right foot MRI without evidence suggestive of acute osteomyelitis. Spoke to Dr. Johnnye Sima with ID who recommends patient received 1 week of IV antibiotics with either PCN  or CTX and then 1 week of Augmentin. PT/OT recommended SNF.  - IV Penicillin G q4h - Day3 - Appreciate CM help with SNF placement   # Acute delirium secondary to sepsis: Patient slept well overnight. Mentation improved today. He is fully alert and oriented. Will continue management as below  - Delirium precautions  - Treating sepsis as above   # Scrotal pain:Improving.  - Will continue to monitor  # Thrombocytopenia:Stable, platelets 104. Will continue to monitor.   # HTN: - Continue home Coreg 25 mg BID - Continue home clonidine patch 0.2mg   - Holding home chlorthalidone in the setting of AKI   # T2DM: Patient was insulin dependent in the past. Per wife, he lost 30 lbs in 3 months in the recent past. He was worked up for this at the [place] with no etiology found. Since then, his DM has been well controlled and he has not required insulin. His last A1c is 5.5, unclear when this was obtained. CBGs at goal.  - SSI-S+HS scale  - CBG monitoring  -Follow upVascular ABIs  # AKI on OEV:OJJKK function now at baseline, Cr 2.1.  - Avoid nephrotoxic agents   # History of CVA: Multiple CVAs in the past. Most recent CVA in 08/2017 in residual R-sided deficits. Follows [facility] with [facility].  - Continue home Plavix 75 mg QD  - Continue home atorvastatin 40 mg QD   # ?Depression: Patient med list medication includes Wellbutrin and Zoloft.  - Continue home Bupropion 75 mg QD -  Continue home Zoloft 100 mg QD   # ?BPH: - Continue home Tamsulosin 0.4 mg QD    Dispo: Anticipated discharge in approximately 1-2 day(s).   Welford Roche, MD 11/15/2017, 8:38 PM Pager: [phone]

## 2017-11-15 NOTE — Progress Notes (Signed)
Subjective: Cody Silva is a 72 y.o. Male who p/w fever at 104F, tachycardia, tachypnea, and foot callous/wound. He reports no overnight complaints. Does endorse that his constant hiccups have been causing increased frustration and nausea due to difficulty eating. Having some difficulty sleeping due to his diaphragm spasms. Overall, no pain or acute distress. Discussed the plan with patient who is in agreement.   Objective: Vital signs in last 24 hours: Vitals:   11/14/17 2127 11/15/17 0600 11/15/17 0859 11/15/17 1344  BP: (!) 147/80 (!) 188/90 (!) 173/87 (!) 161/81  Pulse: 67 70 70 70  Resp: '18 18  20  '$ Temp: 98.4 F (36.9 C) 98.4 F (36.9 C)  98.4 F (36.9 C)  TempSrc: Oral Oral  Oral  SpO2: 100% 100%  100%  Weight:      Height:       Weight change:   Intake/Output Summary (Last 24 hours) at 11/15/2017 1406 Last data filed at 11/15/2017 1344 Gross per 24 hour  Intake 320 ml  Output 1300 ml  Net -980 ml   BP (!) 161/81 (BP Location: Right Arm)   Pulse 70   Temp 98.4 F (36.9 C) (Oral)   Resp 20   Ht '6\' 1"'$  (1.854 m)   Wt 184 lb 12.8 oz (83.8 kg)   SpO2 100%   BMI 24.38 kg/m   General Appearance:    Alert, cooperative, no distress, lying comfortably in bed  Head:    Normocephalic, without obvious abnormality, atraumatic  Eyes:    PERRL, conjunctiva/corneas clear     Back:     Symmetric, no curvature, ROM normal, no CVA tenderness  Lungs:     Clear to auscultation bilaterally, respirations unlabored  Chest wall:    No tenderness or deformity  Heart:    Regular rate and rhythm, S1 and S2 normal, no murmur, rub   or gallop  Abdomen:     Soft, non-tender, bowel sounds active all four quadrants,    no masses, no organomegaly  Extremities:   Callous on lateral aspect of R foot; linear wound at base of toes on plantar surface with associated erythema  Pulses:   2+ and symmetric all extremities  Skin:   Skin color, texture, turgor normal, no rashes or lesions  Neurologic:    CNII-XII intact. Normal strength, sensation and reflexesthroughout. Alert and oriented x 3   Lab Results: Recent Results  Basic metabolic panel     Status: Abnormal   Collection Time: 11/15/17  3:55 AM  Result Value Ref Range   Sodium 135 135 - 145 mmol/L   Potassium 3.6 3.5 - 5.1 mmol/L   Chloride 103 101 - 111 mmol/L   CO2 23 22 - 32 mmol/L   Glucose, Bld 136 (H) 65 - 99 mg/dL   BUN 31 (H) 6 - 20 mg/dL   Creatinine, Ser 2.21 (H) 0.61 - 1.24 mg/dL   Calcium 8.7 (L) 8.9 - 10.3 mg/dL   GFR calc non Af Amer 28 (L) >60 mL/min   GFR calc Af Amer 33 (L) >60 mL/min    Comment: (NOTE) The eGFR has been calculated using the CKD EPI equation. This calculation has not been validated in all clinical situations. eGFR's persistently <60 mL/min signify possible Chronic Kidney Disease.    Anion gap 9 5 - 15    Comment: Performed at [facility] [address], [place], [place] [address]  CBC     Status: Abnormal   Collection Time: 11/15/17  7:14 AM  Result Value Ref Range   WBC 3.9 (L) 4.0 - 10.5 K/uL   RBC 3.64 (L) 4.22 - 5.81 MIL/uL   Hemoglobin 10.8 (L) 13.0 - 17.0 g/dL   HCT 31.7 (L) 39.0 - 52.0 %   MCV 87.1 78.0 - 100.0 fL   MCH 29.7 26.0 - 34.0 pg   MCHC 34.1 30.0 - 36.0 g/dL   RDW 13.0 11.5 - 15.5 %   Platelets 104 (L) 150 - 400 K/uL    Comment: CONSISTENT WITH PREVIOUS RESULT Performed at [facility] [address] [place], [place] [address]   Glucose, capillary     Status: Abnormal   Collection Time: 11/15/17  7:18 AM  Result Value Ref Range   Glucose-Capillary 138 (H) 65 - 99 mg/dL  Glucose, capillary     Status: Abnormal   Collection Time: 11/15/17 11:18 AM  Result Value Ref Range   Glucose-Capillary 152 (H) 65 - 99 mg/dL    Micro Results: Recent Results (from the past 240 hour(s))  Blood Culture (routine x 2)     Status: Abnormal   Collection Time: 11/11/17  1:04 PM  Result Value Ref Range Status   Specimen Description BLOOD LEFT ANTECUBITAL  Final     Special Requests   Final    BOTTLES DRAWN AEROBIC AND ANAEROBIC Blood Culture adequate volume   Culture  Setup Time   Final    GRAM POSITIVE COCCI IN CHAINS IN BOTH AEROBIC AND ANAEROBIC BOTTLES CRITICAL VALUE NOTED.  VALUE IS CONSISTENT WITH PREVIOUSLY REPORTED AND CALLED VALUE.    Culture (A)  Final    GROUP A STREP (S.PYOGENES) ISOLATED SUSCEPTIBILITIES PERFORMED ON PREVIOUS CULTURE WITHIN THE LAST 5 DAYS. Performed at [facility], [place] [address] [place], [place] [address]    Report Status 11/14/2017 FINAL  Final  Blood Culture (routine x 2)     Status: Abnormal   Collection Time: 11/11/17  1:24 PM  Result Value Ref Range Status   Specimen Description BLOOD RIGHT ANTECUBITAL  Final   Special Requests   Final    BOTTLES DRAWN AEROBIC AND ANAEROBIC Blood Culture adequate volume   Culture  Setup Time   Final    GRAM POSITIVE COCCI IN CHAINS IN BOTH AEROBIC AND ANAEROBIC BOTTLES CRITICAL RESULT CALLED TO, READ BACK BY AND VERIFIED WITH: A.MASTERS,PHARMD AT 0348 ON 11/12/17 BY G.MCADOO    Culture (A)  Final    GROUP A STREP (S.PYOGENES) ISOLATED HEALTH DEPARTMENT NOTIFIED Performed at [facility], [place] [address] [place],  [address]    Report Status 11/14/2017 FINAL  Final   Organism ID, Bacteria GROUP A STREP (S.PYOGENES) ISOLATED  Final      Susceptibility   Group a strep (s.pyogenes) isolated - MIC*    PENICILLIN <=0.06 SENSITIVE Sensitive     CEFTRIAXONE <=0.12 SENSITIVE Sensitive     ERYTHROMYCIN <=0.12 SENSITIVE Sensitive     LEVOFLOXACIN 0.5 SENSITIVE Sensitive     VANCOMYCIN <=0.12 SENSITIVE Sensitive     * GROUP A STREP (S.PYOGENES) ISOLATED  Blood Culture ID Panel (Reflexed)     Status: Abnormal   Collection Time: 11/11/17  1:24 PM  Result Value Ref Range Status   Enterococcus species NOT DETECTED NOT DETECTED Final   Listeria monocytogenes NOT DETECTED NOT DETECTED Final   Staphylococcus species NOT DETECTED NOT DETECTED Final    Staphylococcus aureus NOT DETECTED NOT DETECTED Final   Streptococcus species DETECTED (A) NOT DETECTED Final  Comment: CRITICAL RESULT CALLED TO, READ BACK BY AND VERIFIED WITH: A.MASTERS,PHARMD AT 0348 ON 11/12/17 BY G.MCADOO    Streptococcus agalactiae NOT DETECTED NOT DETECTED Final   Streptococcus pneumoniae NOT DETECTED NOT DETECTED Final   Streptococcus pyogenes DETECTED (A) NOT DETECTED Final    Comment: CRITICAL RESULT CALLED TO, READ BACK BY AND VERIFIED WITH: A.MASTERS,PHARMD AT 8250 ON 11/12/17 BY G.MCADOO    Acinetobacter baumannii NOT DETECTED NOT DETECTED Final   Enterobacteriaceae species NOT DETECTED NOT DETECTED Final   Enterobacter cloacae complex NOT DETECTED NOT DETECTED Final   Escherichia coli NOT DETECTED NOT DETECTED Final   Klebsiella oxytoca NOT DETECTED NOT DETECTED Final   Klebsiella pneumoniae NOT DETECTED NOT DETECTED Final   Proteus species NOT DETECTED NOT DETECTED Final   Serratia marcescens NOT DETECTED NOT DETECTED Final   Haemophilus influenzae NOT DETECTED NOT DETECTED Final   Neisseria meningitidis NOT DETECTED NOT DETECTED Final   Pseudomonas aeruginosa NOT DETECTED NOT DETECTED Final   Candida albicans NOT DETECTED NOT DETECTED Final   Candida glabrata NOT DETECTED NOT DETECTED Final   Candida krusei NOT DETECTED NOT DETECTED Final   Candida parapsilosis NOT DETECTED NOT DETECTED Final   Candida tropicalis NOT DETECTED NOT DETECTED Final    Comment: Performed at [facility], [place] [address] [place], [place] [address]  Culture, blood (routine x 2)     Status: None (Preliminary result)   Collection Time: 11/13/17 12:38 PM  Result Value Ref Range Status   Specimen Description BLOOD RIGHT HAND  Final   Special Requests   Final    BOTTLES DRAWN AEROBIC AND ANAEROBIC Blood Culture adequate volume   Culture   Final    NO GROWTH < 24 HOURS Performed at [facility], [place] [address], [place], [place] [address]    Report Status PENDING   Incomplete  Culture, blood (routine x 2)     Status: None (Preliminary result)   Collection Time: 11/13/17 12:42 PM  Result Value Ref Range Status   Specimen Description BLOOD LEFT HAND  Final   Special Requests   Final    BOTTLES DRAWN AEROBIC AND ANAEROBIC Blood Culture adequate volume   Culture   Final    NO GROWTH < 24 HOURS Performed at [facility], [place] [address], [place], [place] [address]    Report Status PENDING  Incomplete   Studies/Results: Ct Head Wo Contrast  Result Date: 11/14/2017 CLINICAL DATA:  Focal neuro deficit, left-sided weakness.  Stroke EXAM: CT HEAD WITHOUT CONTRAST TECHNIQUE: Contiguous axial images were obtained from the base of the skull through the vertex without intravenous contrast. COMPARISON:  CT head 11/11/2017 FINDINGS: Brain: Generalized atrophy unchanged. Chronic microvascular ischemic change throughout the white matter. Chronic lacunar infarctions in the basal ganglia on the left. Negative for acute infarct, hemorrhage, or mass lesion. Vascular: Negative for hyperdense vessel Skull: Negative Sinuses/Orbits: Negative Other: None IMPRESSION: Atrophy and chronic microvascular ischemia.  No acute abnormality. Electronically Signed   By: Franchot Gallo M.D.   On: 11/14/2017 14:02   Medications: I have reviewed the patient's current medications. Scheduled Meds: . atorvastatin  40 mg Oral q1800  . buPROPion  75 mg Oral Daily  . carvedilol  25 mg Oral BID WC  . chlorproMAZINE  25 mg Oral TID  . cloNIDine  0.2 mg Transdermal Weekly  . clopidogrel  75 mg Oral QHS  . dorzolamide-timolol  1 drop Both Eyes BID  . feeding supplement (ENSURE  ENLIVE)  237 mL Oral BID PC  . hydrocerin   Topical BID  . insulin aspart  0-5 Units Subcutaneous QHS  . insulin aspart  0-9 Units Subcutaneous TID WC  . latanoprost  1 drop Both Eyes QHS  . mometasone-formoterol  2 puff Inhalation BID  . montelukast  10 mg Oral QHS  . sertraline  100 mg Oral Daily  . tamsulosin   0.8 mg Oral QHS   Continuous Infusions: . pencillin G potassium IV Stopped (11/15/17 1224)   PRN Meds:.acetaminophen, acetaminophen, hydrALAZINE, hydrALAZINE, Melatonin, ondansetron (ZOFRAN) IV, polyethylene glycol, polyvinyl alcohol Assessment/Plan: Principal Problem:   Sepsis ([place]) Active Problems:   Diabetes mellitus ([place])   Hypertension   History of CVA (cerebrovascular accident)   Depression   AKI (acute kidney injury) ([place])   CKD stage 3 due to type 2 diabetes mellitus ([place])   Acute sepsis ([place])   Scrotal pain   Wound eschar of foot  Cody Silva is a 72 year old male with past medical history of T2DM, HTN, and dementia who is being treated for GAS bacteremia following admission for altered behavior and fever. Thorazine ordered to assist with diaphragmatic spasms.   #GAS Bacteremia. IV Penicillin G Day 2. Afebrile overnight and hemodynamically stable. WBCs continue to trend down. Source is likely right foot due to ulceration and prior wounds (sequelae to T2DM). Right foot MRI shows no evidence of acute osteomyelitis. - IV Penicillin G q4 hrs, no evidence of osteomyelitis - Repeat Blood cx - PT/OT to follow-up, appreciate assistance - Consideration for SNF at d/c per patient preference  #Scrotal Pain. Reports that bilateral testicular pain with palpation and also light touch has improved significantly. Scrotal u/s grossly unremarkable other than microcalcifications in left testicle and small cyst or spermatocele in left epididymis.  - Continue to monitor  #Thrombocytopenia. Chronic issue unclear if worked up in the past through [place]. Plts were down-trending recently from 22 >> 72. Found to be 104 today (03/14) on most recent CBC. Will continue to monitor.  - CBC in AM - D/c SQ Heparin if Plts < 50  # HTN.Hypertensive in setting of volume resuscitation.Has IV Hydralazine PRNordered. We will continue to monitor. - Continue home Coreg 25 mg BID - Continue home  clonidine patch 0.'2mg'$   - Holding home chlorthalidone in the setting of AKI   # T2DM. Patient was insulin dependent in the past. Per wife, he lost 30 lbs in 3 months in the recent past. He was worked up for this at the [place] with no etiology found. Since then, his DM has been well controlled and he has not required insulin. His last A1c is 5.5, unclear when this was obtained. CBGs at goal.  - SSI-S+HS scale  - CBG monitoring  -Follow upVascular ABIs  #AKI on CKD. Renal fxn stable. Cr 2.12. Continue to monitor.  - Monitor electrolytes - Avoid nephrotoxic agents - Repeat BMP  #History of CVA. Multiple CVAs in the past. Most recent CVA in 08/2017 resulting in residual R-sided deficits. Follows up with [facility]. - Continue home Plavix 75 mg QD - Continue home Atorvastatin 40 mg QD  #Depression - Continue home Buproprion '75mg'$  QD - Continue home Sertraline '100mg'$  QD  #?BPH - Continue home Tamsulosin 0.'4mg'$  QD   Disposition: Anticipated discharge in approximately 2-3 days pending improvement in clinical status and completion of workup.   This is a Careers information officer Note.  The care of the patient was discussed with Dr. Isac Sarna and the assessment  and plan formulated with their assistance.  Please see their attached note for official documentation of the daily encounter.   LOS: 3 days   Toy Cookey, Medical Student 11/15/2017, 2:06 PM

## 2017-11-16 ENCOUNTER — Inpatient Hospital Stay: Payer: Self-pay

## 2017-11-16 ENCOUNTER — Ambulatory Visit (INDEPENDENT_AMBULATORY_CARE_PROVIDER_SITE_OTHER): Payer: Medicare Other | Admitting: *Deleted

## 2017-11-16 ENCOUNTER — Inpatient Hospital Stay (HOSPITAL_COMMUNITY): Payer: Medicare Other

## 2017-11-16 DIAGNOSIS — I639 Cerebral infarction, unspecified: Secondary | ICD-10-CM

## 2017-11-16 LAB — GLUCOSE, CAPILLARY
GLUCOSE-CAPILLARY: 132 mg/dL — AB (ref 65–99)
Glucose-Capillary: 198 mg/dL — ABNORMAL HIGH (ref 65–99)
Glucose-Capillary: 215 mg/dL — ABNORMAL HIGH (ref 65–99)
Glucose-Capillary: 236 mg/dL — ABNORMAL HIGH (ref 65–99)

## 2017-11-16 LAB — CBC
HCT: 32.9 % — ABNORMAL LOW (ref 39.0–52.0)
Hemoglobin: 11.2 g/dL — ABNORMAL LOW (ref 13.0–17.0)
MCH: 29.5 pg (ref 26.0–34.0)
MCHC: 34 g/dL (ref 30.0–36.0)
MCV: 86.6 fL (ref 78.0–100.0)
Platelets: 135 10*3/uL — ABNORMAL LOW (ref 150–400)
RBC: 3.8 MIL/uL — AB (ref 4.22–5.81)
RDW: 12.9 % (ref 11.5–15.5)
WBC: 4.4 10*3/uL (ref 4.0–10.5)

## 2017-11-16 LAB — BASIC METABOLIC PANEL
ANION GAP: 8 (ref 5–15)
BUN: 26 mg/dL — AB (ref 6–20)
CHLORIDE: 104 mmol/L (ref 101–111)
CO2: 24 mmol/L (ref 22–32)
Calcium: 9 mg/dL (ref 8.9–10.3)
Creatinine, Ser: 1.89 mg/dL — ABNORMAL HIGH (ref 0.61–1.24)
GFR calc Af Amer: 40 mL/min — ABNORMAL LOW (ref >60)
GFR calc non Af Amer: 34 mL/min — ABNORMAL LOW (ref >60)
Glucose, Bld: 151 mg/dL — ABNORMAL HIGH (ref 65–99)
Potassium: 3.6 mmol/L (ref 3.5–5.1)
SODIUM: 136 mmol/L (ref 135–145)

## 2017-11-16 MED ORDER — SODIUM CHLORIDE 0.9% FLUSH: 10.0000 mL | INTRAVENOUS | Status: DC | PRN

## 2017-11-16 MED ORDER — AMLODIPINE BESYLATE 5 MG PO TABS
5.0000 mg | ORAL_TABLET | Freq: Every day | ORAL | Status: DC
  Administered 2017-11-16: 5 mg via ORAL
  Filled 2017-11-16: qty 1

## 2017-11-16 NOTE — Progress Notes (Signed)
Subjective:  No acute events overnight. Patient remains hypertensive with sBP 160-190s and requiring IV hydralazine PRN. Vital signs are other wise stable. This morning he continues to do well. Scrotal pain continues to improve. No complaints this morning.  Discussed with patient he will need IV antibiotic therapy for a few more days and the be transitioned to oral therapy for 1 week. Also advise follow up with podiatry as source of infection is likely from wound in R foot. He is medically stable for discharge and will be discharged to SNF when bed is available.   Objective:  Vital signs in last 24 hours: Vitals:   11/15/17 2100 11/15/17 2235 11/16/17 0357 11/16/17 0534  BP:  (!) 190/95  (!) 190/100  Pulse:  69    Resp:  20    Temp:  98.3 F (36.8 C)    TempSrc:  Oral    SpO2: 98% 99%    Weight:   178 lb 2.1 oz (80.8 kg)   Height:       Physical Exam  Constitutional: He is oriented to person, place, and time. He appears well-developed and well-nourished. No distress.  Neck: Normal range of motion. Neck supple.  Cardiovascular: Normal rate, regular rhythm and normal heart sounds. Exam reveals no gallop and no friction rub.  No murmur heard. Pulmonary/Chest: Effort normal. No respiratory distress.  Abdominal: Soft. Bowel sounds are normal. He exhibits no distension. There is no tenderness.  Musculoskeletal: Normal range of motion. He exhibits no edema.  Neurological: He is alert and oriented to person, place, and time.  Skin: He is not diaphoretic.    Assessment/Plan:  Principal Problem:   Sepsis ([place]) Active Problems:   Diabetes mellitus ([place])   Hypertension   History of CVA (cerebrovascular accident)   Depression   AKI (acute kidney injury) ([place])   CKD stage 3 due to type 2 diabetes mellitus ([place])   Acute sepsis ([facility])   Scrotal pain   Wound eschar of foot   Streptococcal bacteremia  #Sepsis secondary toGAS Bacteremia:On IV penicillin GDay 4.Remains afebrile and  hemodynamically stable. Repeat Bcx negative at 72 hours. Leukocytosis resolved.Source of bacteremia likely right foot.Right foot MRI without evidence suggestive of acute osteomyelitis. Spoke to Dr. Johnnye Sima with ID on 3/14 who recommends patient received 1 week of IV antibiotics with either PCN or CTX and then 1 week of Augmentin. PT/OT recommended SNF and 3 in 1 bedside commode.  - IV Penicillin G q4h - Day4 - Appreciate CM help with SNF placement   # Acute delirium secondary to sepsis:Resolved  - Delirium precautions - Treating sepsis as above   # Scrotal pain:Improving. Likely secondary to skin irritation. Will continue to monitor  # Thrombocytopenia:Stable. Will continue to monitor.   # HTN: - Continue home Coreg 25 mg BID - Continue home clonidine patch 0.2mg   - Holding home chlorthalidone in the setting of AKI   # T2DM: Patient was insulin dependent in the past. Per wife, he lost 30 lbs in 3 months in the recent past. He was worked up for this at the [place] with no etiology found. Since then, his DM has been well controlled and he has not required insulin. His last A1c is 5.5, unclear when this was obtained. CBGs at goal.  - SSI-S+HS scale  - CBG monitoring  -Follow upVascular ABIs  # AKI on CVE:LFYBO functionimproved, Cr 1.8.  - Avoid nephrotoxic agents   # History of CVA: Multiple CVAs in the past. Most recent  CVA in 08/2017 in residual R-sided deficits. [facility].  - Continue home Plavix 75 mg QD  - Continue home atorvastatin 40 mg QD   # ?Depression: Patient med list medication includes Wellbutrin and Zoloft.  - Continue home Bupropion 75 mg QD - Continue home Zoloft 100 mg QD   # ?BPH: - Continue home Tamsulosin 0.4 mg QD    Dispo: Anticipated discharge in approximately today-1 day(s) pending SNF placement.   Welford Roche, MD 11/16/2017, 6:38 AM Pager: [phone]

## 2017-11-16 NOTE — Progress Notes (Signed)
Patient's wife contacted CSW and now prefers [facility]. [facility] able to accept patient this weekend and will get insurance auth today in preparation.   Percell Locus Oluwaseyi Raffel LCSW [phone]

## 2017-11-16 NOTE — Plan of Care (Signed)
Progressing

## 2017-11-16 NOTE — Progress Notes (Signed)
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   Head CT revealed no acute intracranial abnormalities, mild chronic microvascular ischemic change, small basal ganglia lacunar infarcts with overall stable appearance from the prior head CT.    PT Comments    Pt treatment currently limited by hypertension. BP at 184/94 with medication. Pt transferred to recliner chair with min A and multimodal cues for safety and technique. Will continue to follow acutely to maximize pt functional independence and activity tolerance.     Follow Up Recommendations  SNF     Equipment Recommendations  None recommended by PT    Recommendations for Other Services OT consult     Precautions / Restrictions Precautions Precautions: Fall Restrictions Weight Bearing Restrictions: No    Mobility  Bed Mobility Overal bed mobility: Needs Assistance Bed Mobility: Sit to Supine     Supine to sit: Min guard;HOB elevated     General bed mobility comments: min guard for pt to progress to EOB. Cues for technique. Pt requires increased time and use of bed rails with HOB elevated.  Transfers Overall transfer level: Needs assistance Equipment used: Rolling walker (2 wheeled) Transfers: Sit to/from Stand Sit to Stand: Min assist         General transfer comment: min A to rise into standing with multimodal cues for technique.   Ambulation/Gait Ambulation/Gait assistance: Min assist Ambulation Distance (Feet): 3 Feet Assistive device: Rolling walker (2 wheeled) Gait Pattern/deviations: Step-to pattern;Decreased stride length;Shuffle;Drifts right/left Gait velocity: decreased Gait velocity interpretation:  Below normal speed for age/gender General Gait Details: Pt took steps small shuffling steps to recliner chair.   Stairs            Wheelchair Mobility    Modified Rankin (Stroke Patients Only)       Balance Overall balance assessment: Needs assistance   Sitting balance-Leahy Scale: Fair     Standing balance support: Bilateral upper extremity supported Standing balance-Leahy Scale: Poor                              Cognition Arousal/Alertness: Awake/alert(lethargic in middle of session) Behavior During Therapy: WFL for tasks assessed/performed Overall Cognitive Status: Impaired/Different from baseline                         Following Commands: Follows multi-step commands with increased time     Problem Solving: Slow processing;Decreased initiation;Difficulty sequencing;Requires verbal cues;Requires tactile cues General Comments: Some improvements noted with cognition and sequencing today. Pt required fewer VC for mobilities.       Exercises      General Comments General comments (skin integrity, edema, etc.): wife and daughter present for session      Pertinent Vitals/Pain Pain Assessment: No/denies pain    Home Living                      Prior Function            PT Goals (current goals can now be found in the care plan section) Acute Rehab PT Goals Patient Stated Goal: to go home PT Goal Formulation: With  patient Time For Goal Achievement: 11/27/17 Potential to Achieve Goals: Good Progress towards PT goals: Progressing toward goals    Frequency    Min 3X/week      PT Plan Current plan remains appropriate    Co-evaluation              AM-PAC PT "6 Clicks" Daily Activity  Outcome Measure  Difficulty turning over in bed (including adjusting bedclothes, sheets and blankets)?: A Little Difficulty moving from lying on back to sitting on the side of the bed? : Unable Difficulty sitting down on and  standing up from a chair with arms (e.g., wheelchair, bedside commode, etc,.)?: Unable Help needed moving to and from a bed to chair (including a wheelchair)?: A Little Help needed walking in hospital room?: A Little Help needed climbing 3-5 steps with a railing? : A Lot 6 Click Score: 13    End of Session Equipment Utilized During Treatment: Gait belt Activity Tolerance: Treatment limited secondary to medical complications (Comment)(Hypertension) Patient left: with call bell/phone within reach;with family/visitor present;in chair Nurse Communication: Mobility status PT Visit Diagnosis: Other abnormalities of gait and mobility (R26.89);Muscle weakness (generalized) (M62.81)     Time: 5072-2575 PT Time Calculation (min) (ACUTE ONLY): 30 min  Charges:  $Therapeutic Activity: 23-37 mins                    G Codes:      Benjiman Core, [place] Pager [phone] Acute Rehab   Allena Katz 11/16/2017, 1:31 PM

## 2017-11-16 NOTE — Progress Notes (Signed)
Peripherally Inserted Central Catheter/Midline Placement  The IV Nurse has discussed with the patient and/or persons authorized to consent for the patient, the purpose of this procedure and the potential benefits and risks involved with this procedure.  The benefits include less needle sticks, lab draws from the catheter, and the patient may be discharged home with the catheter. Risks include, but not limited to, infection, bleeding, blood clot (thrombus formation), and puncture of an artery; nerve damage and irregular heartbeat and possibility to perform a PICC exchange if needed/ordered by physician.  Alternatives to this procedure were also discussed.  Bard Power PICC patient education guide, fact sheet on infection prevention and patient information card has been provided to patient /or left at bedside.    PICC/Midline Placement Documentation  PICC Single Lumen 76/73/41 PICC Right Basilic 41 cm 0 cm (Active)  Indication for Insertion or Continuance of Line Prolonged intravenous therapies 11/16/2017 11:00 AM  Exposed Catheter (cm) 0 cm 11/16/2017 11:00 AM  Site Assessment Dry;Clean;Intact 11/16/2017 11:00 AM  Line Status Flushed;Blood return noted 11/16/2017 11:00 AM  Dressing Type Transparent 11/16/2017 11:00 AM  Dressing Status Clean;Dry;Intact;Antimicrobial disc in place 11/16/2017 11:00 AM  Dressing Intervention New dressing 11/16/2017 11:00 AM  Dressing Change Due 11/23/17 11/16/2017 11:00 AM    Telephone consent signed by Wife   Christella Noa Albarece 11/16/2017, 11:45 AM

## 2017-11-16 NOTE — Discharge Summary (Signed)
Name: Cody Silva MRN: [id number] DOB: [birth year] 72 y.o. PCP: [facility], [facility]  Date of Admission: 11/11/2017 12:18 PM Date of Discharge: 11/18/2017 Attending Physician: Lucious Groves, DO  Discharge Diagnosis: 1. Sepsis secondary to Streptococcus pyogenes bacteremia  2. Right foot wound  3. Scrotal pain  4. AKI on CKD 5. HTN 6. History of CVA  7. Depression   Principal Problem:   Sepsis ([place]) Active Problems:   Diabetes mellitus ([place])   Hypertension   History of CVA (cerebrovascular accident)   Depression   AKI (acute kidney injury) ([place])   CKD stage 3 due to type 2 diabetes mellitus ([place])   Acute sepsis (HCC)   Scrotal pain   Wound eschar of foot   Streptococcal bacteremia   Discharge Medications: Allergies as of 11/18/2017      Reactions   Metformin And Related Diarrhea   Tape Rash      Medication List    STOP taking these medications   chlorthalidone 25 MG tablet Commonly known as:  HYGROTON   oxyCODONE-acetaminophen 5-325 MG tablet Commonly known as:  PERCOCET/ROXICET   potassium chloride 10 MEQ tablet Commonly known as:  K-DUR,KLOR-CON     TAKE these medications   acetaminophen 500 MG tablet Commonly known as:  TYLENOL Take 1 tablet (500 mg total) by mouth every 6 (six) hours as needed for mild pain, moderate pain, fever or headache.   albuterol 108 (90 Base) MCG/ACT inhaler Commonly known as:  PROVENTIL HFA;VENTOLIN HFA Inhale 1 puff into the lungs every 6 (six) hours as needed for wheezing or shortness of breath.   ammonium lactate 12 % lotion Commonly known as:  LAC-HYDRIN Apply 1 application topically as needed for dry skin.   amoxicillin-clavulanate 875-125 MG tablet Commonly known as:  AUGMENTIN Take 1 tablet by mouth 2 (two) times daily for 7 days. Start taking on:  11/20/2017   atorvastatin 20 MG tablet Commonly known as:  LIPITOR Take 2 tablets (40 mg total) by mouth daily at 6 PM.   budesonide-formoterol 80-4.5  MCG/ACT inhaler Commonly known as:  SYMBICORT Inhale 2 puffs into the lungs 2 (two) times daily.   buPROPion 75 MG tablet Commonly known as:  WELLBUTRIN Take 75 mg by mouth daily.   Carboxymethylcellulose Sod PF 0.25 % Soln Place 1 drop into both eyes 4 (four) times daily.   carvedilol 12.5 MG tablet Commonly known as:  COREG Take 2 tablets (25 mg total) by mouth 2 (two) times daily with a meal.   cetirizine 10 MG tablet Commonly known as:  ZYRTEC Take 10 mg by mouth at bedtime as needed for allergies.   chlorproMAZINE 10 MG tablet Commonly known as:  THORAZINE Take 1 tablet (10 mg total) by mouth 3 (three) times daily as needed for hiccoughs.   cloNIDine 0.2 mg/24hr patch Commonly known as:  CATAPRES - Dosed in mg/24 hr Place 0.2 mg onto the skin once a week.   clopidogrel 75 MG tablet Commonly known as:  PLAVIX Take 1 tablet (75 mg total) by mouth at bedtime. What changed:  when to take this   dorzolamide-timolol 22.3-6.8 MG/ML ophthalmic solution Commonly known as:  COSOPT Place 1 drop into both eyes as needed.   feeding supplement (ENSURE ENLIVE) Liqd Take 237 mLs by mouth 2 (two) times daily after a meal.   fluticasone 50 MCG/ACT nasal spray Commonly known as:  FLONASE Place 1 spray into both nostrils daily.   gabapentin 300 MG capsule Commonly known as:  NEURONTIN Take 300 mg by mouth at bedtime.   insulin aspart 100 UNIT/ML injection Commonly known as:  novoLOG Inject 0-9 Units into the skin 3 (three) times daily with meals. CBG 121-150=1 unit, CBG 151-200=2 units, CBG 201-250=3 units; CBG 251-300=5 units, CBG 301-350=7 units, CBG 351-400=9 units What changed:    how much to take  when to take this  reasons to take this  additional instructions   ketotifen 0.025 % ophthalmic solution Commonly known as:  ZADITOR Place 1 drop into both eyes as needed.   latanoprost 0.005 % ophthalmic solution Commonly known as:  XALATAN Place 1 drop into both  eyes at bedtime.   losartan 100 MG tablet Commonly known as:  COZAAR Take 100 mg by mouth daily.   Melatonin 3 MG Tabs Take 3 mg by mouth at bedtime.   montelukast 10 MG tablet Commonly known as:  SINGULAIR Take 10 mg by mouth at bedtime.   penicillin G potassium 3 Million Units in dextrose 5 % 100 mL Inject 3 Million Units into the vein every 4 (four) hours for 10 doses.   sertraline 100 MG tablet Commonly known as:  ZOLOFT Take 100 mg by mouth daily.   tamsulosin 0.4 MG Caps capsule Commonly known as:  FLOMAX Take 0.8 mg by mouth at bedtime.   terbinafine 1 % cream Commonly known as:  LAMISIL Apply 1 application topically 2 (two) times daily.   tolnaftate 1 % powder Commonly known as:  TINACTIN Apply 1 application topically 2 (two) times daily.   Vitamin D3 2000 units Tabs Take 2,000 Units by mouth daily.      Disposition and follow-up:   Cody Silva was discharged from [facility] in Stable condition.  At the hospital follow up visit please address:  1.  Please assess for ongoing signs/symptoms of systemic infection and assess compliance with antibiotic therapy. Please assess for ongoing scrotal pain. Please ensure patient has a follow up appointment with podiatry.    Complete 10 more doses of penicillin G every 4 hours then switch to augmentin BID for 7 days.  2.  Restart home chlorthalidone 25mg  daily if renal function stable (held while inpatient due to AKI, which has resolved with IVF)  3.  Labs / imaging needed at time of follow-up: CBC to check platelets and BMP to check renal function   4.  Pending labs/ test needing follow-up: Blood culture 11/13/17 NGTDx4 days   Follow-up Appointments: Follow-up Information    Clinic, [facility] Follow up.   Why:  Please schedule a hospital follow up appointment with your regular doctor.  Contact information: [place] [phone]            Hospital Course by problem list: Principal Problem:   Sepsis ([place]) Active Problems:   Diabetes mellitus ([place])   Hypertension   History of CVA (cerebrovascular accident)   Depression   AKI (acute kidney injury) ([place])   CKD stage 3 due to type 2 diabetes mellitus ([place])   Acute sepsis (HCC)   Scrotal pain   Wound eschar of foot   Streptococcal bacteremia   1. Sepsis secondary to Streptococcus pyogenes bacteremia: Patient presented with high grade fever and altered mental status. He was initially started on broad spectrum antibiotics with Vancomycin and Zosyn. Blood cultures grew Streptococcus pyogenes and antibiotic therapy switched to IV Penicillin G. He defervesce and mentation was back to baseline on HD#2. The source of his infection was  likely a R foot wound (see below) that was imaged and no evidence suggestive of osteomyelitis noted. Head CT, CXR, and UA without evidence of acute processes (see below). Flu negative. He is to complete a total of 1 week course of IV Penicillin G, to be completed on 3/19, followed by 1 week of Augmentin BID (3/20-3/26).  2. Right foot wound: Patient presented with 2 wounds on plantar surface of R foot one of which was associated with surrounding erythema. It is uncleasr how patient developed these wounds, but his wife did noticed he had been bleeding from one of them in the past. Therefore this was thought to be the source of bacteremia . XR and MRI without evidence of acute osteomyelitis. ABI's were obtained, showing RLE wnl (1.28) and LLE with non-compressible arteries (1.07). Patient follows up with podiatry at the [facility] and was advised to schedule a follow up appointment with them for further management of R foot wounds.   3. Scrotal pain: Patient complained of scrotal pain on HD#2. Scrotal exam was unremarkable at the time and scrotal US ordered, which showed bilateral hydroceles and small L epididymis cyst, but otherwise unremarkable (see below).  Patient's pain improved without intervention.   4. AKI on CKD: Patient has a history of CKD Stage III secondary to HTN and T2DM. He presented with a Creatinine of 2.4 from a baseline of 2. This appeared to be pre-renal in etiology and resolved with IVF hydration.   5. HTN Carvedilol and clonidine patch continued throughout hospitalization. Losartan and chlorthalidone initially held due to AKI. Losartan restarted prior to discharge after resolution of AKI. Can restart home chlorthalidone if renal function remains stable and continues to be hypertensive.  6. History of CVA: Patient has a history of CVA with most recent one in 08/2017. He has residual R-sided deficits from this recent episode. On HD#4, patient's wife expressed concern for new L-sided facial droop. Repeat head CT was negative for acute intracranial process (see below).   7. Depression: Patient was continued on home medication of Zoloft and Bupropion.   Discharge Vitals:   BP (!) 180/98 (BP Location: Left Arm)   Pulse 74   Temp 98.5 F (36.9 C) (Oral)   Resp 18   Ht 6\' 1"  (1.854 m)   Wt 178 lb 2.1 oz (80.8 kg)   SpO2 100%   BMI 23.50 kg/m   Pertinent Labs, Studies, and Procedures:   CBC    Component Value Date/Time   WBC 4.4 11/16/2017 0537   RBC 3.80 (L) 11/16/2017 0537   HGB 11.2 (L) 11/16/2017 0537   HCT 32.9 (L) 11/16/2017 0537   PLT 135 (L) 11/16/2017 0537   MCV 86.6 11/16/2017 0537   MCH 29.5 11/16/2017 0537   MCHC 34.0 11/16/2017 0537   RDW 12.9 11/16/2017 0537   LYMPHSABS 0.5 (L) 11/11/2017 1239   MONOABS 0.2 11/11/2017 1239   EOSABS 0.0 11/11/2017 1239   BASOSABS 0.0 11/11/2017 1239   BMP Latest Ref Rng & Units 11/17/2017 11/16/2017 11/15/2017  Glucose 65 - 99 mg/dL 164(H) 151(H) 136(H)  BUN 6 - 20 mg/dL 27(H) 26(H) 31(H)  Creatinine 0.61 - 1.24 mg/dL 1.85(H) 1.89(H) 2.21(H)  Sodium 135 - 145 mmol/L 134(L) 136 135  Potassium 3.5 - 5.1 mmol/L 3.6 3.6 3.6  Chloride 101 - 111 mmol/L 101 104 103  CO2 22  - 32 mmol/L 24 24 23   Calcium 8.9 - 10.3 mg/dL 8.6(L) 9.0 8.7(L)   Urinalysis    Component Value Date/Time  COLORURINE YELLOW 11/11/2017 [place] 11/11/2017 1247   LABSPEC 1.011 11/11/2017 1247   PHURINE 5.0 11/11/2017 1247   GLUCOSEU NEGATIVE 11/11/2017 1247   HGBUR LARGE (A) 11/11/2017 1247   BILIRUBINUR NEGATIVE 11/11/2017 1247   KETONESUR 5 (A) 11/11/2017 1247   PROTEINUR >=300 (A) 11/11/2017 1247   UROBILINOGEN 1.0 11/02/2014 2301   NITRITE NEGATIVE 11/11/2017 1247   LEUKOCYTESUR NEGATIVE 11/11/2017 1247    CXR 3/10: FINDINGS: Stable mild cardiomegaly. Unchanged loop recorder. Normal pulmonary vascularity. No focal consolidation, pleural effusion, or pneumothorax. No acute osseous abnormality.  Head CT 3/10: FINDINGS: Brain: No evidence of acute infarction, hemorrhage, hydrocephalus, extra-axial collection or mass lesion/mass effect. Patchy white matter hypoattenuation is noted consistent with mild chronic microvascular ischemic change. Small focus of hypoattenuation near the genu of the right internal capsule and in the left basal ganglia consistent with old lacune infarcts.  Vascular: No hyperdense vessel or unexpected calcification.  Skull: Normal. Negative for fracture or focal lesion.  Sinuses/Orbits: Globes and orbits are unremarkable. Sinuses and mastoid air cells are clear.  XR R foot 3/10: FINDINGS: Hallux valgus deformity is noted. No acute fracture or dislocation is seen. No soft tissue abnormality is noted. No bony erosion is seen.  US Scrotum 3/11: FINDINGS: Right testicle :Measurements: 2.9 x 1.7 x 2.4 cm. No mass or microlithiasis visualized.  Left testicle Measurements: 3.2 x 2.1 x 2.3 cm. Two tiny clusters of microcalcifications are identified. 0.2 cm cyst in the periphery of the left testicle is noted.  Right epididymis:  Normal in size and appearance.  Left epididymis:  0.4 cm cyst is noted.  Hydrocele:  Small  bilateral.  Varicocele:  None visualized.  Pulsed Doppler interrogation of both testes demonstrates normal low resistance arterial and venous waveforms bilaterally.  MR R foot 3/11: FINDINGS: Bones/Joint/Cartilage No acute fracture or frank bone destruction. Osteoarthritic joint space narrowing of the first MTP with minimal spurring. Lesser degrees of mild joint space narrowing is seen of the DIP and PIP joints of the second through fifth digits. No cortical bone destruction is identified. Probable reactive edema involving the great through third toes from approximately the heads of the proximal phalanges distally. No focal chondromalacia. There is hallux valgus of the great toe.  Ligaments: Negative  Muscles and Tendons No evidence of pyomyositis. The extensor and flexor tendons are maintained without apparent tenosynovitis.  Soft tissues Mild-to-moderate generalized subcutaneous and intramuscular edema.  Head CT 3/13: FINDINGS: Brain: Generalized atrophy unchanged. Chronic microvascular ischemic change throughout the white matter. Chronic lacunar infarctions in the basal ganglia on the left. Negative for acute infarct, hemorrhage, or mass lesion.  Vascular: Negative for hyperdense vessel  Skull: Negative  Sinuses/Orbits: Negative  Other: None  Vasc U/S ABI 11/17/17: Final Interpretation: Right: Resting right ankle-brachial index is within normal range. No evidence of significant right lower extremity arterial disease. Left: Resting left ankle-brachial index indicates noncompressible left lower extremity arteries.  Discharge Instructions: Discharge Instructions    Call MD for:  difficulty breathing, headache or visual disturbances   Complete by:  As directed    Call MD for:  extreme fatigue   Complete by:  As directed    Call MD for:  hives   Complete by:  As directed    Call MD for:  persistant dizziness or light-headedness   Complete by:  As  directed    Call MD for:  persistant nausea and vomiting   Complete by:  As directed  Call MD for:  redness, tenderness, or signs of infection (pain, swelling, redness, odor or green/yellow discharge around incision site)   Complete by:  As directed    Call MD for:  severe uncontrolled pain   Complete by:  As directed    Call MD for:  temperature >100.4   Complete by:  As directed    Diet - low sodium heart healthy   Complete by:  As directed    Discharge instructions   Complete by:  As directed    Please continue Penicillin G 96mil units every 4 hours for 10 more doses. After this is complete start Augmentin one pill twice a day for 7 days to complete treatment for Group A Strep bacteremia.  For HTN: Coreg 25mg  BID Clonidine patch 0.2mg  every 7 days Losartan 100mg  daily was restarted today His home chlorthalidone was held; can restart if renal function stable and still hypertensive.  Please have patient follow up with PCP in 1-2 weeks; please repeat Bmet in 1 week.   Increase activity slowly   Complete by:  As directed      Signed: Alphonzo Grieve, MD 11/18/2017, 1:21 PM

## 2017-11-16 NOTE — Progress Notes (Signed)
Patient has high blood pressure 190/95 given hydralazine 10 mg oral. Patient refused to re- check blood pressure after given medicine.

## 2017-11-16 NOTE — Progress Notes (Signed)
Pharmacy Antibiotic Note  Cody Silva is a 71 y.o. male admitted on 11/11/2017 with Group A strep bacteremia.  Pharmacy has been consulted for PCN G dosing.  Per MD note, PICC placed with plans for IV antibiotics to complete 7 days followed by PO abx for 7 days, total abx length of therapy of 14 days.  Today is day #5 of IV abx therapy.  Plan: PCN G 3 million units IV q4h through 3/17. Followed by Amoxicillin or Augmentin for additional 7 days (3/18-3/24).  Thank you for allowing pharmacy to be a part of this patient's care.  [employer], Pharm.D., BCPS Clinical Pharmacist Pager: [phone] Clinical phone for 11/16/2017 from 8:30-4:00 is [phone]. After 4pm, please call Main [facility] [phone]) for assistance. 11/16/2017 12:24 PM

## 2017-11-17 ENCOUNTER — Inpatient Hospital Stay (HOSPITAL_COMMUNITY): Payer: Medicare Other

## 2017-11-17 DIAGNOSIS — E11621 Type 2 diabetes mellitus with foot ulcer: Secondary | ICD-10-CM

## 2017-11-17 LAB — BASIC METABOLIC PANEL
Anion gap: 9 (ref 5–15)
BUN: 27 mg/dL — AB (ref 6–20)
CALCIUM: 8.6 mg/dL — AB (ref 8.9–10.3)
CO2: 24 mmol/L (ref 22–32)
CREATININE: 1.85 mg/dL — AB (ref 0.61–1.24)
Chloride: 101 mmol/L (ref 101–111)
GFR calc Af Amer: 41 mL/min — ABNORMAL LOW (ref >60)
GFR, EST NON AFRICAN AMERICAN: 35 mL/min — AB (ref >60)
GLUCOSE: 164 mg/dL — AB (ref 65–99)
POTASSIUM: 3.6 mmol/L (ref 3.5–5.1)
SODIUM: 134 mmol/L — AB (ref 135–145)

## 2017-11-17 LAB — GLUCOSE, CAPILLARY
GLUCOSE-CAPILLARY: 139 mg/dL — AB (ref 65–99)
Glucose-Capillary: 147 mg/dL — ABNORMAL HIGH (ref 65–99)
Glucose-Capillary: 181 mg/dL — ABNORMAL HIGH (ref 65–99)
Glucose-Capillary: 148 mg/dL — ABNORMAL HIGH (ref 65–99)

## 2017-11-17 MED ORDER — CARVEDILOL 12.5 MG PO TABS: 12.5000 mg | ORAL_TABLET | Freq: Two times a day (BID) | ORAL | Status: DC

## 2017-11-17 NOTE — Progress Notes (Signed)
   Subjective:  Patient sleeping but wakens easily. He denies chest pain, shortness of breath, fevers, chills, n/v.  Objective:  Vital signs in last 24 hours: Vitals:   11/16/17 1641 11/16/17 2024 11/16/17 2034 11/17/17 0557  BP: (!) 158/98 (!) 168/75  (!) 175/83  Pulse: 68 69  63  Resp:  16  16  Temp:  98 F (36.7 C)  98.7 F (37.1 C)  TempSrc:  Oral  Oral  SpO2:  100% 98% 99%  Weight:      Height:       Constitutional: NAD, pleasant CV: RRR, no M/R/G appreciated Resp: CTAB, no increased work of breathing Neuro: CN 2-12 grossly intact, moves all 4 extremities freely Ext: right foot with bandage   Assessment/Plan:  Principal Problem:   Sepsis ([place]) Active Problems:   Diabetes mellitus ([place])   Hypertension   History of CVA (cerebrovascular accident)   Depression   AKI (acute kidney injury) ([place])   CKD stage 3 due to type 2 diabetes mellitus (HCC)   Acute sepsis (HCC)   Scrotal pain   Wound eschar of foot   Streptococcal bacteremia  Sepsis - resolved  GAS bacteremia 2/2 foot wound: Day 5/7 of penicillin G; repeat BCx from 3/12 NGTD x4d. PICC line in place. After 7 days of IVAbx will switch to Augmentin BID for another 7 days to complete course.  HTN: Asymptomatic.  --coreg 25mg  BID --clonidine patch 0.2mg  weekly --AKI resolved, will likely restart home losartan at discharge with f/u Bmet in 1 wk  T2DM: SSI-S +HS. Vascular ABIs wnl.  AKI on CKD - resolved 2/2 sepsis. Cr 1.9 which is baseline. Will restart home losartan at discharge  Dispo: Anticipated discharge in approximately 0-1 day(s) to SNF when available.   Alphonzo Grieve, MD 11/17/2017, 12:41 PM

## 2017-11-17 NOTE — Progress Notes (Signed)
VASCULAR LAB PRELIMINARY  ARTERIAL  ABI completed: ABIs are within normal limits.      RIGHT    LEFT    PRESSURE WAVEFORM  PRESSURE WAVEFORM  BRACHIAL restricted T BRACHIAL 189 T  DP   DP    AT 241 B AT 202 T  PT 187 B PT 187 B  PER   PER    GREAT TOE Bandages NA GREAT TOE Normal waveform NA    RIGHT LEFT  ABI 1.28 1.07     Gavriella Hearst, RVT 11/17/2017, 10:10 AM

## 2017-11-17 NOTE — Plan of Care (Signed)
Progressing

## 2017-11-18 DIAGNOSIS — R7881 Bacteremia: Secondary | ICD-10-CM

## 2017-11-18 DIAGNOSIS — B954 Other streptococcus as the cause of diseases classified elsewhere: Secondary | ICD-10-CM

## 2017-11-18 DIAGNOSIS — R251 Tremor, unspecified: Secondary | ICD-10-CM

## 2017-11-18 DIAGNOSIS — Z95828 Presence of other vascular implants and grafts: Secondary | ICD-10-CM

## 2017-11-18 DIAGNOSIS — L0889 Other specified local infections of the skin and subcutaneous tissue: Secondary | ICD-10-CM

## 2017-11-18 LAB — GLUCOSE, CAPILLARY
Glucose-Capillary: 156 mg/dL — ABNORMAL HIGH (ref 65–99)
Glucose-Capillary: 158 mg/dL — ABNORMAL HIGH (ref 65–99)

## 2017-11-18 LAB — CULTURE, BLOOD (ROUTINE X 2)
CULTURE: NO GROWTH
Culture: NO GROWTH
Special Requests: ADEQUATE
Special Requests: ADEQUATE

## 2017-11-18 MED ORDER — ATORVASTATIN CALCIUM 20 MG PO TABS: 40.0000 mg | ORAL_TABLET | Freq: Every day | ORAL | 0 refills | Status: DC

## 2017-11-18 MED ORDER — CARVEDILOL 12.5 MG PO TABS: 25.0000 mg | ORAL_TABLET | Freq: Two times a day (BID) | ORAL | 0 refills | Status: DC

## 2017-11-18 MED ORDER — CHLORPROMAZINE HCL 10 MG PO TABS
10.0000 mg | ORAL_TABLET | Freq: Three times a day (TID) | ORAL | Status: DC | PRN
  Administered 2017-11-18: 10 mg via ORAL
  Filled 2017-11-18: qty 1

## 2017-11-18 MED ORDER — LOSARTAN POTASSIUM 50 MG PO TABS
100.0000 mg | ORAL_TABLET | Freq: Every day | ORAL | Status: DC
  Administered 2017-11-18: 100 mg via ORAL
  Filled 2017-11-18: qty 2

## 2017-11-18 MED ORDER — CARVEDILOL 25 MG PO TABS
25.0000 mg | ORAL_TABLET | Freq: Two times a day (BID) | ORAL | Status: DC
  Administered 2017-11-18: 25 mg via ORAL
  Filled 2017-11-18: qty 1

## 2017-11-18 MED ORDER — INSULIN ASPART 100 UNIT/ML ~~LOC~~ SOLN: 0.0000 [IU] | Freq: Three times a day (TID) | SUBCUTANEOUS | 0 refills | Status: DC

## 2017-11-18 MED ORDER — PENICILLIN G POTASSIUM 5000000 UNITS IJ SOLR: 3.0000 10*6.[IU] | INTRAVENOUS | Status: AC

## 2017-11-18 MED ORDER — CHLORPROMAZINE HCL 10 MG PO TABS: 10.0000 mg | ORAL_TABLET | Freq: Three times a day (TID) | ORAL | 0 refills | Status: DC | PRN

## 2017-11-18 MED ORDER — CLONIDINE HCL 0.2 MG/24HR TD PTWK
0.2000 mg | MEDICATED_PATCH | TRANSDERMAL | Status: DC
  Administered 2017-11-18: 0.2 mg via TRANSDERMAL
  Filled 2017-11-18: qty 1

## 2017-11-18 MED ORDER — AMOXICILLIN-POT CLAVULANATE 875-125 MG PO TABS: 1.0000 | ORAL_TABLET | Freq: Two times a day (BID) | ORAL | 0 refills | Status: AC

## 2017-11-18 NOTE — Progress Notes (Signed)
   Subjective:  Patient denies fevers, chills, abd pain, chest pain; he still has hiccoughs.  Objective:  Vital signs in last 24 hours: Vitals:   11/18/17 0013 11/18/17 0511 11/18/17 0627 11/18/17 0741  BP: (!) 172/79 (!) 193/91 (!) 191/93   Pulse:  72    Resp:  16    Temp:  98.4 F (36.9 C)    TempSrc:  Oral    SpO2:  97%  99%  Weight:      Height:       Constitutional: NAD, pleasant CV: RRR, no M/R/G appreciated Resp: CTAB, no increased work of breathing Neuro: CN 2-12 grossly intact, moves all 4 extremities freely; resting tremor unchanged Ext: right foot with bandage   Assessment/Plan:  Principal Problem:   Sepsis ([place]) Active Problems:   Diabetes mellitus ([place])   Hypertension   History of CVA (cerebrovascular accident)   Depression   AKI (acute kidney injury) ([place])   CKD stage 3 due to type 2 diabetes mellitus ([place])   Acute sepsis (HCC)   Scrotal pain   Wound eschar of foot   Streptococcal bacteremia  Sepsis - resolved  GAS bacteremia 2/2 foot wound: Day 6/7 of penicillin G; repeat BCx from 3/12 NGTD x5d. PICC line in place. After 7 days of IVAbx will switch to Augmentin BID for another 7 days to complete course.  HTN: Asymptomatic.  --coreg 25mg  BID --clonidine patch 0.2mg  weekly --AKI resolved, restarting losartan 100mg  daily; at SNF they can restart home chlorthalidone if renal function remains stable  T2DM: SSI-S +HS. Vascular ABIs wnl.  AKI on CKD - resolved 2/2 sepsis. Cr 1.9 which is baseline. Will restart home losartan today.  Dispo: Anticipated discharge in approximately 0-1 day(s) to SNF when available.   Alphonzo Grieve, MD 11/18/2017, 10:36 AM [phone]

## 2017-11-18 NOTE — Progress Notes (Signed)
Pt given discharge instructions, prescriptions, and care notes. Pt verbalized understanding AEB no further questions or concerns at this time. Pt left the floor via stretcher with PTAR in stable condition.

## 2017-11-18 NOTE — Progress Notes (Signed)
Notified physician for elevated blood pressure, given orders to give coreg now and change clonidine patch.  Will continue to monitor.

## 2017-11-18 NOTE — Progress Notes (Deleted)
Patient will Discharge To: [place] Date: 11/18/17 Family Notified: yes, Toluwani Yadav [phone] (left message), Jamar Esselman, son [phone] Transport By: Corey Harold   Per MD patient ready for DC to [facility], patient, patient's family, and facility notified of DC. Assessment, Fl2/Pasrr, and Discharge Summary sent to facility. RN given number for report [phone] ask for [place]). DC packet on chart. Ambulance transport requested for patient.   CSW signing off.  Reed Breech LCSWA [phone]

## 2017-11-18 NOTE — Progress Notes (Signed)
Internal Medicine Attending:   I saw and examined the patient. I reviewed the resident's note and I agree with the resident's findings and plan as documented in the resident's note.  Patient feels well today with no new complaints.  Patient was initially admitted for group A strep bacteremia secondary to infected wound in his foot.  Patient is on day 6 of 7 of IV penicillin G for his infection.  He will transition to Augmentin orally twice daily to complete a 7-day course after he completes 7 days of IV antibiotics.  Patient has a PICC line in place and is stable for discharge to SNF today.  Of note, patient blood pressure remains uncontrolled.  We will continue him on his home doses of Coreg and clonidine and resume his home losartan today.  His losartan was initially held secondary to AKI which is now resolved.  If his blood pressure remains elevated please restart his chlorthalidone at SNF.

## 2017-11-18 NOTE — Progress Notes (Signed)
Patient will Discharge To: [facility] Date: 11/18/17 Family Notified:yes, Caz Weaver, spouse (left voice message) [phone], Copelan Maultsby, son [phone] Transport TC:NGFR   Per MD patient ready for DC to [facility]. RN, patient, patient's family, and facility notified of DC. Assessment, Fl2/Pasrr, and Discharge Summary sent to facility. RN given number for report [phone] ask for [place]). DC packet on chart. Ambulance transport requested for patient.   CSW signing off.  Reed Breech LCSWA [phone]

## 2017-11-19 NOTE — Progress Notes (Signed)
Carelink Summary Report / Loop Recorder 

## 2017-11-21 ENCOUNTER — Telehealth: Payer: Self-pay | Admitting: Cardiology

## 2017-11-21 NOTE — Telephone Encounter (Signed)
LMOVM requesting that pt send manual transmission b/c home monitor has not updated in at least 14 days.    

## 2017-11-27 ENCOUNTER — Encounter: Payer: Self-pay | Admitting: Cardiology

## 2017-12-03 ENCOUNTER — Inpatient Hospital Stay (HOSPITAL_COMMUNITY)
Admission: EM | Admit: 2017-12-03 | Discharge: 2017-12-07 | DRG: 639 | Disposition: A | Discharge status: Home Health | Payer: Medicare Other | Attending: Internal Medicine | Admitting: Internal Medicine

## 2017-12-03 ENCOUNTER — Encounter (HOSPITAL_COMMUNITY): Payer: Self-pay | Admitting: Emergency Medicine

## 2017-12-03 DIAGNOSIS — Z888 Allergy status to other drugs, medicaments and biological substances status: Secondary | ICD-10-CM

## 2017-12-03 DIAGNOSIS — Z961 Presence of intraocular lens: Secondary | ICD-10-CM | POA: Diagnosis present

## 2017-12-03 DIAGNOSIS — Z9842 Cataract extraction status, left eye: Secondary | ICD-10-CM

## 2017-12-03 DIAGNOSIS — E1122 Type 2 diabetes mellitus with diabetic chronic kidney disease: Secondary | ICD-10-CM | POA: Diagnosis present

## 2017-12-03 DIAGNOSIS — Z8673 Personal history of transient ischemic attack (TIA), and cerebral infarction without residual deficits: Secondary | ICD-10-CM

## 2017-12-03 DIAGNOSIS — Z7902 Long term (current) use of antithrombotics/antiplatelets: Secondary | ICD-10-CM

## 2017-12-03 DIAGNOSIS — E1142 Type 2 diabetes mellitus with diabetic polyneuropathy: Secondary | ICD-10-CM | POA: Diagnosis present

## 2017-12-03 DIAGNOSIS — E11319 Type 2 diabetes mellitus with unspecified diabetic retinopathy without macular edema: Secondary | ICD-10-CM | POA: Diagnosis present

## 2017-12-03 DIAGNOSIS — Z87898 Personal history of other specified conditions: Secondary | ICD-10-CM

## 2017-12-03 DIAGNOSIS — R509 Fever, unspecified: Secondary | ICD-10-CM | POA: Diagnosis present

## 2017-12-03 DIAGNOSIS — R0689 Other abnormalities of breathing: Secondary | ICD-10-CM

## 2017-12-03 DIAGNOSIS — E11628 Type 2 diabetes mellitus with other skin complications: Secondary | ICD-10-CM | POA: Diagnosis not present

## 2017-12-03 DIAGNOSIS — Z794 Long term (current) use of insulin: Secondary | ICD-10-CM

## 2017-12-03 DIAGNOSIS — E11621 Type 2 diabetes mellitus with foot ulcer: Secondary | ICD-10-CM

## 2017-12-03 DIAGNOSIS — I129 Hypertensive chronic kidney disease with stage 1 through stage 4 chronic kidney disease, or unspecified chronic kidney disease: Secondary | ICD-10-CM | POA: Diagnosis present

## 2017-12-03 DIAGNOSIS — Z91048 Other nonmedicinal substance allergy status: Secondary | ICD-10-CM

## 2017-12-03 DIAGNOSIS — L97519 Non-pressure chronic ulcer of other part of right foot with unspecified severity: Secondary | ICD-10-CM | POA: Diagnosis present

## 2017-12-03 DIAGNOSIS — M19079 Primary osteoarthritis, unspecified ankle and foot: Secondary | ICD-10-CM | POA: Diagnosis present

## 2017-12-03 DIAGNOSIS — R011 Cardiac murmur, unspecified: Secondary | ICD-10-CM | POA: Diagnosis present

## 2017-12-03 DIAGNOSIS — F4310 Post-traumatic stress disorder, unspecified: Secondary | ICD-10-CM | POA: Diagnosis present

## 2017-12-03 DIAGNOSIS — L089 Local infection of the skin and subcutaneous tissue, unspecified: Secondary | ICD-10-CM

## 2017-12-03 DIAGNOSIS — L97509 Non-pressure chronic ulcer of other part of unspecified foot with unspecified severity: Secondary | ICD-10-CM

## 2017-12-03 DIAGNOSIS — G4733 Obstructive sleep apnea (adult) (pediatric): Secondary | ICD-10-CM | POA: Diagnosis present

## 2017-12-03 DIAGNOSIS — R531 Weakness: Secondary | ICD-10-CM

## 2017-12-03 DIAGNOSIS — N183 Chronic kidney disease, stage 3 (moderate): Secondary | ICD-10-CM | POA: Diagnosis present

## 2017-12-03 DIAGNOSIS — Z7951 Long term (current) use of inhaled steroids: Secondary | ICD-10-CM

## 2017-12-03 DIAGNOSIS — Z9841 Cataract extraction status, right eye: Secondary | ICD-10-CM

## 2017-12-03 DIAGNOSIS — Z79899 Other long term (current) drug therapy: Secondary | ICD-10-CM

## 2017-12-03 LAB — CBC WITH DIFFERENTIAL/PLATELET
Basophils Absolute: 0 10*3/uL (ref 0.0–0.1)
Basophils Relative: 0 %
EOS ABS: 0 10*3/uL (ref 0.0–0.7)
EOS PCT: 0 %
HCT: 30.2 % — ABNORMAL LOW (ref 39.0–52.0)
Hemoglobin: 10.1 g/dL — ABNORMAL LOW (ref 13.0–17.0)
Lymphocytes Relative: 12 %
Lymphs Abs: 0.9 10*3/uL (ref 0.7–4.0)
MCH: 29.8 pg (ref 26.0–34.0)
MCHC: 33.4 g/dL (ref 30.0–36.0)
MCV: 89.1 fL (ref 78.0–100.0)
MONOS PCT: 6 %
Monocytes Absolute: 0.5 10*3/uL (ref 0.1–1.0)
Neutro Abs: 6.2 10*3/uL (ref 1.7–7.7)
Neutrophils Relative %: 82 %
PLATELETS: 144 10*3/uL — AB (ref 150–400)
RBC: 3.39 MIL/uL — ABNORMAL LOW (ref 4.22–5.81)
RDW: 13.6 % (ref 11.5–15.5)
WBC: 7.6 10*3/uL (ref 4.0–10.5)

## 2017-12-03 LAB — COMPREHENSIVE METABOLIC PANEL
ALK PHOS: 64 U/L (ref 38–126)
ALT: 9 U/L — AB (ref 17–63)
AST: 14 U/L — AB (ref 15–41)
Albumin: 2.7 g/dL — ABNORMAL LOW (ref 3.5–5.0)
Anion gap: 8 (ref 5–15)
BUN: 21 mg/dL — AB (ref 6–20)
CALCIUM: 8.6 mg/dL — AB (ref 8.9–10.3)
CHLORIDE: 104 mmol/L (ref 101–111)
CO2: 25 mmol/L (ref 22–32)
CREATININE: 2.18 mg/dL — AB (ref 0.61–1.24)
GFR calc Af Amer: 34 mL/min — ABNORMAL LOW (ref >60)
GFR, EST NON AFRICAN AMERICAN: 29 mL/min — AB (ref >60)
Glucose, Bld: 140 mg/dL — ABNORMAL HIGH (ref 65–99)
Potassium: 3.6 mmol/L (ref 3.5–5.1)
Sodium: 137 mmol/L (ref 135–145)
Total Bilirubin: 0.9 mg/dL (ref 0.3–1.2)
Total Protein: 6.7 g/dL (ref 6.5–8.1)

## 2017-12-03 LAB — I-STAT CG4 LACTIC ACID, ED: LACTIC ACID, VENOUS: 0.98 mmol/L (ref 0.5–1.9)

## 2017-12-03 MED ORDER — MONTELUKAST SODIUM 10 MG PO TABS
10.0000 mg | ORAL_TABLET | Freq: Every day | ORAL | Status: DC
  Administered 2017-12-04 – 2017-12-06 (×4): 10 mg via ORAL
  Filled 2017-12-03 (×5): qty 1

## 2017-12-03 MED ORDER — CLONIDINE HCL 0.3 MG/24HR TD PTWK: 0.3000 mg | MEDICATED_PATCH | TRANSDERMAL | Status: DC

## 2017-12-03 MED ORDER — ACETAMINOPHEN 325 MG PO TABS
650.0000 mg | ORAL_TABLET | Freq: Once | ORAL | Status: AC
  Administered 2017-12-03: 650 mg via ORAL
  Filled 2017-12-03: qty 2

## 2017-12-03 MED ORDER — SERTRALINE HCL 100 MG PO TABS
100.0000 mg | ORAL_TABLET | Freq: Every day | ORAL | Status: DC
  Administered 2017-12-04 – 2017-12-07 (×4): 100 mg via ORAL
  Filled 2017-12-03 (×4): qty 1

## 2017-12-03 MED ORDER — TAMSULOSIN HCL 0.4 MG PO CAPS
0.8000 mg | ORAL_CAPSULE | Freq: Every day | ORAL | Status: DC
  Administered 2017-12-04 – 2017-12-06 (×4): 0.8 mg via ORAL
  Filled 2017-12-03 (×6): qty 2

## 2017-12-03 MED ORDER — GABAPENTIN 300 MG PO CAPS
300.0000 mg | ORAL_CAPSULE | Freq: Every day | ORAL | Status: DC
  Administered 2017-12-04 – 2017-12-06 (×4): 300 mg via ORAL
  Filled 2017-12-03 (×5): qty 1

## 2017-12-03 MED ORDER — SODIUM CHLORIDE 0.9 % IV SOLN
2.0000 g | INTRAVENOUS | Status: DC
  Administered 2017-12-04 – 2017-12-07 (×4): 2 g via INTRAVENOUS
  Filled 2017-12-03 (×6): qty 20

## 2017-12-03 MED ORDER — SODIUM CHLORIDE 0.9 % IV BOLUS
1000.0000 mL | Freq: Once | INTRAVENOUS | Status: AC
  Administered 2017-12-03: 1000 mL via INTRAVENOUS

## 2017-12-03 MED ORDER — CARVEDILOL 25 MG PO TABS
25.0000 mg | ORAL_TABLET | Freq: Two times a day (BID) | ORAL | Status: DC
  Administered 2017-12-04 – 2017-12-07 (×8): 25 mg via ORAL
  Filled 2017-12-03 (×8): qty 1

## 2017-12-03 MED ORDER — CLOPIDOGREL BISULFATE 75 MG PO TABS
75.0000 mg | ORAL_TABLET | Freq: Every day | ORAL | Status: DC
  Administered 2017-12-04 – 2017-12-06 (×4): 75 mg via ORAL
  Filled 2017-12-03 (×6): qty 1

## 2017-12-03 MED ORDER — BUPROPION HCL 75 MG PO TABS
75.0000 mg | ORAL_TABLET | Freq: Every day | ORAL | Status: DC
  Administered 2017-12-04 – 2017-12-07 (×4): 75 mg via ORAL
  Filled 2017-12-03 (×5): qty 1

## 2017-12-03 MED ORDER — DORZOLAMIDE HCL-TIMOLOL MAL 2-0.5 % OP SOLN: 1.0000 [drp] | Freq: Two times a day (BID) | OPHTHALMIC | Status: DC | PRN

## 2017-12-03 MED ORDER — LATANOPROST 0.005 % OP SOLN
1.0000 [drp] | Freq: Every day | OPHTHALMIC | Status: DC
  Administered 2017-12-04 – 2017-12-06 (×4): 1 [drp] via OPHTHALMIC
  Filled 2017-12-03: qty 2.5

## 2017-12-03 MED ORDER — LOSARTAN POTASSIUM 50 MG PO TABS
100.0000 mg | ORAL_TABLET | Freq: Every day | ORAL | Status: DC
  Administered 2017-12-04 – 2017-12-07 (×4): 100 mg via ORAL
  Filled 2017-12-03 (×4): qty 2

## 2017-12-03 MED ORDER — ATORVASTATIN CALCIUM 40 MG PO TABS
40.0000 mg | ORAL_TABLET | Freq: Every day | ORAL | Status: DC
  Administered 2017-12-04 – 2017-12-07 (×4): 40 mg via ORAL
  Filled 2017-12-03 (×4): qty 1

## 2017-12-03 MED ORDER — SODIUM CHLORIDE 0.9 % IV SOLN
1.0000 g | Freq: Once | INTRAVENOUS | Status: AC
  Administered 2017-12-03: 1 g via INTRAVENOUS
  Filled 2017-12-03: qty 10

## 2017-12-03 MED ORDER — HYDROCODONE-ACETAMINOPHEN 5-325 MG PO TABS: 2.0000 | ORAL_TABLET | Freq: Once | ORAL | Status: DC

## 2017-12-03 MED ORDER — MOMETASONE FURO-FORMOTEROL FUM 100-5 MCG/ACT IN AERO
2.0000 | INHALATION_SPRAY | Freq: Two times a day (BID) | RESPIRATORY_TRACT | Status: DC
  Administered 2017-12-04 – 2017-12-06 (×4): 2 via RESPIRATORY_TRACT
  Filled 2017-12-03: qty 8.8

## 2017-12-03 NOTE — ED Notes (Signed)
Pt informed he needs to produce a UA when possible. Pt (and family member) verbalized understanding.

## 2017-12-03 NOTE — H&P (Signed)
Date: 12/04/2017               Patient Name:  Cody Silva MRN: [id number]  DOB: 11-08-1945 Age / Sex: 72 y.o., male   PCP: [facility] Service: Internal Medicine Teaching Service         Attending Physician: Dr. Melina Copa, Rebeca Alert, MD    First Contact: Dr. Vickki Muff Pager: [phone]  Second Contact: Dr. 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 History obtained directly from the patient and wife, who was bedside.   Per chart review admitted from 3/10-3/17/2019 for sepsis 2/2 group A strep bacteremia, which was thought to be 2/2 underlying R foot wound. During admission patient received broad spectrum IV antibiotics which were narrowed to IV penicillin early in hospitalization. Patient was discharged with 1 week course of penicillin G to be followed by 1 week of Augmentin BID (planned to be completed on 11/27/2017). Patient's workup, including MRI R foot and CXR< was negative for R foot osteomyelitis. Bilateral ABI within normal limits. He was discharged to skilled nursing facility to complete above antibiotic therapy.   Patient was discharged from skilled nursing facility this AM per wife report. While in SNF patient was doing well and tolerating antibiotics without difficulty. Patient's wife reports he had PICC line removed 24 hours prior to discharge from facility. She also noticed that the morning after PICC line removal the patient had a low-grade fever to 100.6 that wasn't addressed by facility prior to discharge. Once they left the facility and returned home patient was acting  normal until he started eating dinner. At that time he stated he felt "weak" all over and that he "forgot how to stand up". His wife and son who were home and helped him stand up to the bathroom. When they got him to the bathroom he was too weak to get up from the toilet and this is when the family called EMS for help. Patient was noted to be very warm to the touch at that time. Patient endorsed chills at that time. He had one loose bowel movement while on the toilet prior to EMS arrival. NO other episodes of loose stools. No complaints of cough, chest pain, SOB, abdominal pain, dysuria, and/or headaches.  In the ED the patient was febrile to 39.3C, non-tachycardic, stable on room air, and hypertensive to 190-200s/90s-110s. Patient's lactic acid was 0.98. WBC = 7.6 and Hgb = 10.2 (10-12 during previous hospitalization). BMP significant for Cr 2.1, with historical baseline around 2.1-2.5. No imaging was obtained. IMTS called for admission.  Meds:  Current Meds  Medication Sig  . acetaminophen (TYLENOL) 500 MG tablet Take 1 tablet (500 mg total) by mouth every 6 (six) hours as needed for mild pain, moderate pain, fever or headache.  . albuterol (PROVENTIL HFA;VENTOLIN HFA) 108 (90 Base) MCG/ACT inhaler Inhale 1 puff into the lungs every 6 (six) hours as needed for wheezing or shortness of breath.  [name] ammonium lactate (LAC-HYDRIN) 12 % lotion Apply 1 application topically as needed for dry skin.  [name]  atorvastatin (LIPITOR) 20 MG tablet Take 2 tablets (40 mg total) by mouth daily at 6 PM.  . budesonide-formoterol (SYMBICORT) 80-4.5 MCG/ACT inhaler Inhale 2 puffs into the lungs 2 (two) times daily.  [name] buPROPion (WELLBUTRIN) 75 MG tablet Take 75 mg by mouth daily.  . Carboxymethylcellulose Sod PF 0.25 % SOLN Place 1 drop into both eyes 4 (four) times daily.  . carvedilol (COREG) 25 MG tablet Take 25 mg by mouth 2 (two) times daily with a meal.  . cetirizine (ZYRTEC) 10 MG tablet Take 10 mg by mouth at bedtime  as needed for allergies.  . Cholecalciferol (VITAMIN D3) 2000 units TABS Take 2,000 Units by mouth daily.  . cloNIDine (CATAPRES - DOSED IN MG/24 HR) 0.3 mg/24hr patch Place 0.3 mg onto the skin every Friday.  . clopidogrel (PLAVIX) 75 MG tablet Take 1 tablet (75 mg total) by mouth at bedtime. (Patient taking differently: Take 75 mg by mouth daily. )  . dorzolamide-timolol (COSOPT) 22.3-6.8 MG/ML ophthalmic solution Place 1 drop into both eyes as needed (as directed).   . fluticasone (FLONASE) 50 MCG/ACT nasal spray Place 1 spray into both nostrils daily.  [name] gabapentin (NEURONTIN) 300 MG capsule Take 300 mg by mouth at bedtime.   . insulin aspart (NOVOLOG) 100 UNIT/ML injection Inject 0-9 Units into the skin 3 (three) times daily with meals. CBG 121-150=1 unit, CBG 151-200=2 units, CBG 201-250=3 units; CBG 251-300=5 units, CBG 301-350=7 units, CBG 351-400=9 units (Patient taking differently: Inject 0-9 Units into the skin See admin instructions. Inject 0-9 units into the skin three times a day with meals, per sliding scale: BGL: <70 = call MD; 121-150 = 1 unit; 151-200 = 2 units; 201-250 = 3 units; 251-300 = 5 units; 301-350 = 7 units; 351-400 = 9 units)  . ketotifen (ZADITOR) 0.025 % ophthalmic solution Place 1 drop into both eyes daily as needed (for itching or irritation).   [name] latanoprost (XALATAN) 0.005 % ophthalmic solution Place 1 drop into both eyes at bedtime.   [name] losartan (COZAAR) 100 MG tablet Take 100 mg by mouth daily.  . Melatonin 3 MG TABS Take 3 mg by mouth at bedtime.  . montelukast (SINGULAIR) 10 MG tablet Take 10 mg by mouth at bedtime.  . sertraline (ZOLOFT) 100 MG tablet Take 100 mg by mouth daily.  . tamsulosin (FLOMAX) 0.4 MG CAPS capsule Take 0.8 mg by mouth at bedtime.  . terbinafine (LAMISIL) 1 % cream Apply 1 application topically 2 (two) times daily.  [name] tolnaftate (TINACTIN) 1 % powder Apply 1 application topically 2 (two) times daily.   Allergies: Allergies as of  12/03/2017 - Review Complete 12/03/2017  Allergen Reaction Noted  . Metformin and related Diarrhea 06/12/2013  . Milk-related compounds Diarrhea 12/03/2017  . Tape Rash 01/02/2017   Past Medical History: Past Medical History:  Diagnosis Date  . Anemia   . Arthritis    "right arm, right ankle, right side" (10/30/2014)  . Colon polyps   . Diabetic neuropathy ([place])   . Diabetic retinopathy ([place])   . H/O agent Orange exposure   . Headache    "@ least 3 times/wk" (10/30/2014)  . History of blood transfusion 10/30/2014   hematochezia  . History of gout   . Hypertension   . OSA on CPAP    "suppose to wear mask; I've got a call in for an equipment change" (10/30/2014)  . PTSD (post-traumatic stress disorder)    "service related"  . Seizures ([place])   .  Stroke [facility]) 2014   left extremity deficits; facial left  . Type II diabetes mellitus ([place])    Past Surgical History: Past Surgical History:  Procedure Laterality Date  . BONE GRAFT HIP ILIAC CREST Left ~ 1976  . CATARACT EXTRACTION W/ INTRAOCULAR LENS  IMPLANT, BILATERAL Bilateral 2014-2015  . EP IMPLANTABLE DEVICE N/A 12/24/2015   Procedure: Loop Recorder Insertion;  Surgeon: Thompson Grayer, MD;  Location: [place] [facility];  Service: Cardiovascular;  Laterality: N/A;  . TUMOR REMOVAL Right ~ 1976   "arm; had to take bone left hip to add to the repair"   Family History:  Family History  Problem Relation Age of Onset  . Diabetes Mother   . Breast cancer Mother   . Heart attack Mother        CABG - Age 75  . Kidney disease Mother   . Stroke Brother   . Lung disease Father   . Neurofibromatosis Maternal Uncle   . Throat cancer Brother   . Hypertension Brother    Social History:  Patient lives with wife and daughter. Patient walked independently prior to previous hospitalization, but has needed to use walker after leaving hospital/SNF 2/2 deconditioning. Patient denies alcohol, illicit drug use, and tobacco use.  Review of  Systems: A complete ROS was negative except as per HPI.   Physical Exam: Blood pressure (!) 190/89, pulse 84, temperature (!) 102.7 F (39.3 C), temperature source Oral, resp. rate 19, height 6\' 1"  (1.854 m), weight 176 lb (79.8 kg), SpO2 97 %.   Physical Exam  Constitutional: He appears well-developed and well-nourished. No distress.  HENT:  Mouth/Throat: Oropharynx is clear and moist.  Cardiovascular: Normal rate, regular rhythm and intact distal pulses. Exam reveals no friction rub.  Murmur (systolic murmur heard best at left lower sternal border) heard. Respiratory: Effort normal. No respiratory distress. He has no wheezes. He has no rales.  GI: Soft. He exhibits no distension. There is no tenderness. There is no rebound.  Musculoskeletal: He exhibits edema (Trace pitting edema around ankles bilaterally). He exhibits no tenderness.  Skin: Skin is dry. No rash noted. He is not diaphoretic. No erythema.  R foot dressed in clean, dry guaze. Dressing removed for examination. Serosanguinous discharge noted on bandages. Multiple calluses noted on the R great toe and R plantar surfaces on the balls of the patient's feet. No signs of overlying erythema, heat, or tenderness.   Assessment & Plan by Problem: Principal Problem:   Fever Active Problems:   Hx of bacteremia  Krish Bailly is 72 yo with PMH of N6EX complicated by neuropathy, HTN, CKD stage 3-4 and prior history of multiple CVAs who presented for new onset fever within 24 hours of discharge from SNF for treatment of GAS bacteremia. Patient admitted to the internal medicine teaching service. The specific problems addressed during admission were as follows:  Fever: Patient's current presentation similar to previous presentation about 2 weeks ago where pan-sensitive GAS bacteremia was noted. Patient successfully completed about 2 weeks of antibiotic therapy for treatment of this infection (second round of blood cultures after initiating  antibiotics without bacterial growth). Patient had PICC line placed for this treatment and this was removed the day prior to presentation at SNF. The patient's previous PICC site is non-erythematous and non-tender, which is reassuring that there is not an acute cellulitis at the site. It is possible that the patient is bacteremic again, possibly 2/2 underlying R foot wounds although osteomyelitis workup negative and current physical  exam does not reveal overlying erythema or purulent discharge suggestive of current cellulitis/osteonecrosis. Patient has new systolic murmur on exam, which has not been previously noted by ED or inpatient physicians on previous encounters. Patient has echo in 01/2017 with trivial mitral regurgitation noted, no other significant abnormalities. Given new fever, recent bacteremia, and possibility of new murmur on PE most concerned about endocarditis at present. Patient does not have risk factors, such as IV drug use, for endocarditis and only meets 1-67minor [facility] criteria at present (fever, prior blood culture). Will obtain echocardiography to assess for endocarditis. Patient may require TEE to definitively rule this out if fever persists.  -Admit to med-surg -Continue ceftriaxone based off previous culture sensitivities -F/u blood cultures taken on admission -F/u urinalysis taken on admission -Echocardiography in AM to evaluate new murmur and EKG  HTN: Patient hypertensive in ED, but missed evening doses of home medications. At home patient's BP managed with Carvedilol 25 mg BID, Clonidine patch 0.3 mg, and losartan 100 mg daily -Continue carvediolol 25 mg BID, losart  U5KY complicated by neuropathy: Last A1C on 02/2017 = 6.2%, indicating good control at baseline with SSI at home.  -CBG monitoring TID with meals, qHS -SSI TID with meals, qHS -Gabapentin 300 mg qHS  CKD stage 3-4: Cr at baseline 2.1 at current.  -Continue to monitor -Continue home losartan 100 mg daily  Hx  of CVA: -Continue home atorvastatin 40 mg and clopidogrel 75 mg daily  FEN/GI: -Heart Healthy/Carb Modified -No IVF, replace electrolytes as needed -Protonix  VTE Prophylaxis: Lovenox daily Code Status: Full  Dispo: Admit patient to Inpatient with expected length of stay greater than 2 midnights.  SignedThomasene Ripple, MD 12/04/2017, 12:23 AM  Pager: [phone]

## 2017-12-03 NOTE — ED Provider Notes (Signed)
[facility] EMERGENCY DEPARTMENT Provider Note   CSN: [id number] Arrival date & time: 12/03/17  2055     History   Chief Complaint Chief Complaint  Patient presents with  . Fever    HPI Cody Silva is a 72 y.o. male.  Patient with diabetes and hypertension here with fever and weakness.  He was just admitted to the hospital about 2-1/2 weeks ago for sepsis and found to be bacteremic with strep with a likely source to be foot wounds.  He was initially on Zosyn and Vanco and transition to IV penicillin.  He was discharged on the 17th to rehab where he completed IV penicillin and was transitioned to oral Augmentin.  His last dose of antibiotics was unclear but believed to be yesterday.  Today the patient was discharged and was at home for more than 4 hours when he became acutely weak all over and called EMS. He was having difficulty standing and needed assistance.   In triage he was found to have a temp of 102.7.  He is complaining of some foot pain and some weakness.  He denies any cough or shortness of breath.  No vomiting or diarrhea.  No urinary symptoms.  HPI  Past Medical History:  Diagnosis Date  . Anemia   . Arthritis    "right arm, right ankle, right side" (10/30/2014)  . Colon polyps   . Diabetic neuropathy ([place])   . Diabetic retinopathy ([place])   . H/O agent Orange exposure   . Headache    "@ least 3 times/wk" (10/30/2014)  . History of blood transfusion 10/30/2014   hematochezia  . History of gout   . Hypertension   . OSA on CPAP    "suppose to wear mask; I've got a call in for an equipment change" (10/30/2014)  . PTSD (post-traumatic stress disorder)    "service related"  . Seizures ([place])   . Stroke [facility]) 2014   left extremity deficits; facial left  . Type II diabetes mellitus [facility])     Patient Active Problem List   Diagnosis Date Noted  . Streptococcal bacteremia   . Wound eschar of foot   . Acute sepsis ([place]) 11/12/2017  . Scrotal pain     . Sepsis ([place]) 11/11/2017  . Atrophy of muscle of right hand 04/04/2017  . Protein-calorie malnutrition, severe 01/19/2017  . Altered mental status 01/09/2017  . Facial droop 01/09/2017  . Cerebral thrombosis with cerebral infarction 01/09/2017  . Seizures ([place])   . Other hyperlipidemia   . OSA (obstructive sleep apnea) 09/12/2016  . Weight loss 09/12/2016  . Malnutrition of moderate degree 09/11/2016  . Syncope 09/10/2016  . H/O agent Orange exposure 12/24/2015  . Type 2 diabetes with nephropathy ([place])   . Near syncope 12/22/2015  . History of TIAs 09/14/2015  . CKD stage 3 due to type 2 diabetes mellitus ([place]) 09/14/2015  . Acute lower GI bleeding 10/30/2014  . History of colonic polyps 10/30/2014  . Depression 10/30/2014  . Symptomatic anemia 10/30/2014  . AKI (acute kidney injury) ([place]) 10/30/2014  . Ataxic gait 09/21/2014  . History of CVA (cerebrovascular accident) 02/22/2013  . Abnormal brain scan 02/21/2013  . Dizziness 02/21/2013  . Weakness 02/21/2013  . Diabetes mellitus ([place]) 02/21/2013  . Hypertension 02/21/2013    Past Surgical History:  Procedure Laterality Date  . BONE GRAFT HIP ILIAC CREST Left ~ 1976  . CATARACT EXTRACTION W/ INTRAOCULAR LENS  IMPLANT, BILATERAL Bilateral 2014-2015  .  EP IMPLANTABLE DEVICE N/A 12/24/2015   Procedure: Loop Recorder Insertion;  Surgeon: Thompson Grayer, MD;  Location: [facility];  Service: Cardiovascular;  Laterality: N/A;  . TUMOR REMOVAL Right ~ 1976   "arm; had to take bone left hip to add to the repair"        Home Medications    Prior to Admission medications   Medication Sig Start Date End Date Taking? Authorizing Provider  acetaminophen (TYLENOL) 500 MG tablet Take 1 tablet (500 mg total) by mouth every 6 (six) hours as needed for mild pain, moderate pain, fever or headache. 11/01/14   Juluis Mire, MD  albuterol (PROVENTIL HFA;VENTOLIN HFA) 108 (90 Base) MCG/ACT inhaler Inhale 1 puff into the lungs  every 6 (six) hours as needed for wheezing or shortness of breath.    [provider]  ammonium lactate (LAC-HYDRIN) 12 % lotion Apply 1 application topically as needed for dry skin.    [provider]  atorvastatin (LIPITOR) 20 MG tablet Take 2 tablets (40 mg total) by mouth daily at 6 PM. 11/18/17   Alphonzo Grieve, MD  budesonide-formoterol (SYMBICORT) 80-4.5 MCG/ACT inhaler Inhale 2 puffs into the lungs 2 (two) times daily.    [provider]  buPROPion (WELLBUTRIN) 75 MG tablet Take 75 mg by mouth daily.    [provider]  Carboxymethylcellulose Sod PF 0.25 % SOLN Place 1 drop into both eyes 4 (four) times daily.    [provider]  carvedilol (COREG) 12.5 MG tablet Take 2 tablets (25 mg total) by mouth 2 (two) times daily with a meal. 11/18/17   Alphonzo Grieve, MD  cetirizine (ZYRTEC) 10 MG tablet Take 10 mg by mouth at bedtime as needed for allergies.    [provider]  chlorproMAZINE (THORAZINE) 10 MG tablet Take 1 tablet (10 mg total) by mouth 3 (three) times daily as needed for hiccoughs. 11/18/17   Alphonzo Grieve, MD  Cholecalciferol (VITAMIN D3) 2000 units TABS Take 2,000 Units by mouth daily.    [provider]  cloNIDine (CATAPRES - DOSED IN MG/24 HR) 0.2 mg/24hr patch Place 0.2 mg onto the skin once a week.    [provider]  clopidogrel (PLAVIX) 75 MG tablet Take 1 tablet (75 mg total) by mouth at bedtime. Patient taking differently: Take 75 mg by mouth daily.  11/08/14   Juluis Mire, MD  dorzolamide-timolol (COSOPT) 22.3-6.8 MG/ML ophthalmic solution Place 1 drop into both eyes as needed.    [provider]  feeding supplement, ENSURE ENLIVE, (ENSURE ENLIVE) LIQD Take 237 mLs by mouth 2 (two) times daily after a meal. Patient not taking: Reported on 11/11/2017 09/12/16   Asencion Partridge, MD  fluticasone [facility]) 50 MCG/ACT nasal spray Place 1 spray into both nostrils daily.    [provider]   gabapentin (NEURONTIN) 300 MG capsule Take 300 mg by mouth at bedtime.     [provider]  insulin aspart (NOVOLOG) 100 UNIT/ML injection Inject 0-9 Units into the skin 3 (three) times daily with meals. CBG 121-150=1 unit, CBG 151-200=2 units, CBG 201-250=3 units; CBG 251-300=5 units, CBG 301-350=7 units, CBG 351-400=9 units 11/18/17   Alphonzo Grieve, MD  ketotifen (ZADITOR) 0.025 % ophthalmic solution Place 1 drop into both eyes as needed.    [provider]  latanoprost (XALATAN) 0.005 % ophthalmic solution Place 1 drop into both eyes at bedtime.     [provider]  losartan (COZAAR) 100 MG tablet Take 100 mg by mouth  daily.    [provider]  Melatonin 3 MG TABS Take 3 mg by mouth at bedtime.    [provider]  montelukast (SINGULAIR) 10 MG tablet Take 10 mg by mouth at bedtime.    [provider]  sertraline (ZOLOFT) 100 MG tablet Take 100 mg by mouth daily.    [provider]  tamsulosin (FLOMAX) 0.4 MG CAPS capsule Take 0.8 mg by mouth at bedtime.    [provider]  terbinafine (LAMISIL) 1 % cream Apply 1 application topically 2 (two) times daily.    [provider]  tolnaftate (TINACTIN) 1 % powder Apply 1 application topically 2 (two) times daily.    [provider]    Family History Family History  Problem Relation Age of Onset  . Diabetes Mother   . Breast cancer Mother   . Heart attack Mother        CABG - Age 13  . Kidney disease Mother   . Stroke Brother   . Lung disease Father   . Neurofibromatosis Maternal Uncle   . Throat cancer Brother   . Hypertension Brother     Social History Social History   Tobacco Use  . Smoking status: Never Smoker  . Smokeless tobacco: Never Used  Substance Use Topics  . Alcohol use: Yes    Alcohol/week: 0.6 oz    Types: 1 Cans of beer per week    Comment: 10/30/2014 "might have a beer a couple times/yr"  . Drug use: No     Allergies    Metformin and related and Tape   Review of Systems Review of Systems  Constitutional: Positive for chills, fatigue and fever.  HENT: Negative for sore throat.   Respiratory: Negative for shortness of breath.   Cardiovascular: Negative for chest pain.  Gastrointestinal: Negative for abdominal pain.  Genitourinary: Negative for dysuria and hematuria.  Musculoskeletal: Negative for back pain and neck pain.  Skin: Negative for rash.  Neurological: Positive for weakness. Negative for seizures and headaches.   He is systems is positive for fever, weakness, malaise and right foot pain.  He denies any blurry vision double vision sore throat cough chest pain nausea vomiting diarrhea urinary symptoms swollen joints or rashes.  Physical Exam Updated Vital Signs BP (!) 206/110 (BP Location: Right Arm)   Pulse 86   Temp (!) 102.7 F (39.3 C) (Oral)   Resp 17   Ht 6\' 1"  (1.854 m)   Wt 79.8 kg (176 lb)   SpO2 96%   BMI 23.22 kg/m   Physical Exam  Constitutional: He appears well-developed and well-nourished.  HENT:  Head: Normocephalic and atraumatic.  Mouth/Throat: Oropharynx is clear and moist. No oropharyngeal exudate.  Eyes: Conjunctivae are normal. Right eye exhibits no discharge. Left eye exhibits no discharge. No scleral icterus.  Neck: Normal range of motion. Neck supple.  Pulmonary/Chest: Effort normal.  Abdominal: Soft. He exhibits no mass. There is no tenderness. There is no guarding.  Musculoskeletal: Normal range of motion. He exhibits edema and tenderness (r foot).  Neurological: He is alert. GCS eye subscore is 4. GCS verbal subscore is 5. GCS motor subscore is 6.  Skin: Skin is warm and dry.  Psychiatric: He has a normal mood and affect.  Nursing note and vitals reviewed.  Patient is alert nontoxic appearing.  He appears stated age.  His head and neck show no sign of trauma his oropharynx is clear extraocular movements are intact.  Pupils equal round reactive  to light.   Mucous membranes are moist.  Neck is supple and trachea is midline.  Heart is regular rate and rhythm and does have a systolic murmur.  His lungs are clear.  Abdomen is soft nontender no masses guarding rebound.  Extremities he is got a swollen right foot with some dried skin that is fissured with a little bit of yellow thin drainage.   ED Treatments / Results  Labs (all labs ordered are listed, but only abnormal results are displayed) Labs Reviewed  COMPREHENSIVE METABOLIC PANEL - Abnormal; Notable for the following components:      Result Value   Glucose, Bld 140 (*)    BUN 21 (*)    Creatinine, Ser 2.18 (*)    Calcium 8.6 (*)    Albumin 2.7 (*)    AST 14 (*)    ALT 9 (*)    GFR calc non Af Amer 29 (*)    GFR calc Af Amer 34 (*)    All other components within normal limits  CBC WITH DIFFERENTIAL/PLATELET - Abnormal; Notable for the following components:   RBC 3.39 (*)    Hemoglobin 10.1 (*)    HCT 30.2 (*)    Platelets 144 (*)    All other components within normal limits  CBC - Abnormal; Notable for the following components:   RBC 3.02 (*)    Hemoglobin 8.8 (*)    HCT 26.9 (*)    Platelets 133 (*)    All other components within normal limits  CREATININE, SERUM - Abnormal; Notable for the following components:   Creatinine, Ser 2.03 (*)    GFR calc non Af Amer 32 (*)    GFR calc Af Amer 37 (*)    All other components within normal limits  BASIC METABOLIC PANEL - Abnormal; Notable for the following components:   Potassium 3.3 (*)    Glucose, Bld 122 (*)    Creatinine, Ser 2.07 (*)    Calcium 8.3 (*)    GFR calc non Af Amer 31 (*)    GFR calc Af Amer 36 (*)    All other components within normal limits  GLUCOSE, CAPILLARY - Abnormal; Notable for the following components:   Glucose-Capillary 116 (*)    All other components within normal limits  MAGNESIUM - Abnormal; Notable for the following components:   Magnesium 1.4 (*)    All other components within normal limits    CBG MONITORING, ED - Abnormal; Notable for the following components:   Glucose-Capillary 110 (*)    All other components within normal limits  CULTURE, BLOOD (ROUTINE X 2)  CULTURE, BLOOD (ROUTINE X 2)  HEMOGLOBIN A1C  GLUCOSE, CAPILLARY  URINALYSIS, ROUTINE W REFLEX MICROSCOPIC  I-STAT CG4 LACTIC ACID, ED    EKG EKG Interpretation  Date/Time:  Tuesday December 04 2017 00:23:19 EDT Ventricular Rate:  69 PR Interval:    QRS Duration: 109 QT Interval:  410 QTC Calculation: 440 R Axis:   28 Text Interpretation:  Sinus rhythm Borderline repolarization abnormality When compared with ECG of 11/11/2017, ST depression in Inferior leads and  Anterolateral leads has improved Confirmed by Delora Fuel ([id number]) on 12/04/2017 12:47:30 AM   Radiology Dg Chest 2 View  Result Date: 12/04/2017 CLINICAL DATA:  Fever, decreased breath sounds, history of stroke, type II diabetes mellitus, hypertension EXAM: CHEST - 2 VIEW COMPARISON:  11/11/2017 FINDINGS: Loop recorder projects over anterior LEFT chest. Enlargement of cardiac silhouette. Mediastinal contours and pulmonary vascularity normal. Lungs clear.  No acute infiltrate, pleural effusion or pneumothorax. Bones unremarkable. IMPRESSION: Enlargement of cardiac silhouette. No acute abnormalities. Electronically Signed   By: Lavonia Dana M.D.   On: 12/04/2017 11:13    Procedures Procedures (including critical care time)  Medications Ordered in ED Medications  HYDROcodone-acetaminophen (NORCO/VICODIN) 5-325 MG per tablet 2 tablet (has no administration in time range)  sodium chloride 0.9 % bolus 1,000 mL (has no administration in time range)     Initial Impression / Assessment and Plan / ED Course  I have reviewed the triage vital signs and the nursing notes.  Pertinent labs & imaging results that were available during my care of the patient were reviewed by me and considered in my medical decision making (see chart for details).  Clinical Course as  of Dec 04 1152  Mon Dec 03, 2017  2244 Discussed with the internal medicine teaching service attending who will accept the patient back to their service.  We discussed coverage for antibiotics   [MB]    Clinical Course User Index [MB] Hayden Rasmussen, MD   ddx - bacteremia, endocarditis, uti, pneumonia, cellulitis, osteo  Final Clinical Impressions(s) / ED Diagnoses   Final diagnoses:  Decreased breath sounds  Fever    ED Discharge Orders    None       Hayden Rasmussen, MD 12/04/17 1157

## 2017-12-03 NOTE — ED Triage Notes (Signed)
Per EMS pt came home from [facility] today and started to not feel well and became altered.  He was at [facility] place for IV antibiotic due to sepsis.  Today he has an elevated Temp of 102.7.  He is AOx4 NAD noted at this time.

## 2017-12-04 ENCOUNTER — Inpatient Hospital Stay (HOSPITAL_COMMUNITY): Payer: Medicare Other

## 2017-12-04 DIAGNOSIS — Z794 Long term (current) use of insulin: Secondary | ICD-10-CM

## 2017-12-04 DIAGNOSIS — L089 Local infection of the skin and subcutaneous tissue, unspecified: Secondary | ICD-10-CM | POA: Diagnosis present

## 2017-12-04 DIAGNOSIS — L97509 Non-pressure chronic ulcer of other part of unspecified foot with unspecified severity: Secondary | ICD-10-CM | POA: Diagnosis not present

## 2017-12-04 DIAGNOSIS — R509 Fever, unspecified: Secondary | ICD-10-CM | POA: Diagnosis present

## 2017-12-04 DIAGNOSIS — E11621 Type 2 diabetes mellitus with foot ulcer: Secondary | ICD-10-CM | POA: Diagnosis present

## 2017-12-04 DIAGNOSIS — Z841 Family history of disorders of kidney and ureter: Secondary | ICD-10-CM

## 2017-12-04 DIAGNOSIS — L84 Corns and callosities: Secondary | ICD-10-CM | POA: Diagnosis not present

## 2017-12-04 DIAGNOSIS — I340 Nonrheumatic mitral (valve) insufficiency: Secondary | ICD-10-CM | POA: Diagnosis not present

## 2017-12-04 DIAGNOSIS — Z9842 Cataract extraction status, left eye: Secondary | ICD-10-CM | POA: Diagnosis not present

## 2017-12-04 DIAGNOSIS — N183 Chronic kidney disease, stage 3 (moderate): Secondary | ICD-10-CM | POA: Diagnosis present

## 2017-12-04 DIAGNOSIS — I351 Nonrheumatic aortic (valve) insufficiency: Secondary | ICD-10-CM

## 2017-12-04 DIAGNOSIS — L97511 Non-pressure chronic ulcer of other part of right foot limited to breakdown of skin: Secondary | ICD-10-CM | POA: Diagnosis not present

## 2017-12-04 DIAGNOSIS — L97519 Non-pressure chronic ulcer of other part of right foot with unspecified severity: Secondary | ICD-10-CM | POA: Diagnosis present

## 2017-12-04 DIAGNOSIS — F4310 Post-traumatic stress disorder, unspecified: Secondary | ICD-10-CM | POA: Diagnosis present

## 2017-12-04 DIAGNOSIS — E1140 Type 2 diabetes mellitus with diabetic neuropathy, unspecified: Secondary | ICD-10-CM | POA: Diagnosis not present

## 2017-12-04 DIAGNOSIS — Z7902 Long term (current) use of antithrombotics/antiplatelets: Secondary | ICD-10-CM

## 2017-12-04 DIAGNOSIS — Z8673 Personal history of transient ischemic attack (TIA), and cerebral infarction without residual deficits: Secondary | ICD-10-CM | POA: Diagnosis not present

## 2017-12-04 DIAGNOSIS — Z9989 Dependence on other enabling machines and devices: Secondary | ICD-10-CM | POA: Diagnosis not present

## 2017-12-04 DIAGNOSIS — Z91048 Other nonmedicinal substance allergy status: Secondary | ICD-10-CM | POA: Diagnosis not present

## 2017-12-04 DIAGNOSIS — E1142 Type 2 diabetes mellitus with diabetic polyneuropathy: Secondary | ICD-10-CM | POA: Diagnosis present

## 2017-12-04 DIAGNOSIS — Z8619 Personal history of other infectious and parasitic diseases: Secondary | ICD-10-CM | POA: Diagnosis not present

## 2017-12-04 DIAGNOSIS — E1122 Type 2 diabetes mellitus with diabetic chronic kidney disease: Secondary | ICD-10-CM | POA: Diagnosis present

## 2017-12-04 DIAGNOSIS — M109 Gout, unspecified: Secondary | ICD-10-CM | POA: Diagnosis not present

## 2017-12-04 DIAGNOSIS — Z91011 Allergy to milk products: Secondary | ICD-10-CM | POA: Diagnosis not present

## 2017-12-04 DIAGNOSIS — I10 Essential (primary) hypertension: Secondary | ICD-10-CM | POA: Diagnosis not present

## 2017-12-04 DIAGNOSIS — Z79899 Other long term (current) drug therapy: Secondary | ICD-10-CM | POA: Diagnosis not present

## 2017-12-04 DIAGNOSIS — R531 Weakness: Secondary | ICD-10-CM

## 2017-12-04 DIAGNOSIS — E11628 Type 2 diabetes mellitus with other skin complications: Secondary | ICD-10-CM | POA: Diagnosis present

## 2017-12-04 DIAGNOSIS — G4733 Obstructive sleep apnea (adult) (pediatric): Secondary | ICD-10-CM | POA: Diagnosis present

## 2017-12-04 DIAGNOSIS — M19079 Primary osteoarthritis, unspecified ankle and foot: Secondary | ICD-10-CM | POA: Diagnosis present

## 2017-12-04 DIAGNOSIS — Z9841 Cataract extraction status, right eye: Secondary | ICD-10-CM | POA: Diagnosis not present

## 2017-12-04 DIAGNOSIS — I129 Hypertensive chronic kidney disease with stage 1 through stage 4 chronic kidney disease, or unspecified chronic kidney disease: Secondary | ICD-10-CM

## 2017-12-04 DIAGNOSIS — Z961 Presence of intraocular lens: Secondary | ICD-10-CM | POA: Diagnosis present

## 2017-12-04 DIAGNOSIS — R7881 Bacteremia: Secondary | ICD-10-CM | POA: Diagnosis not present

## 2017-12-04 DIAGNOSIS — R011 Cardiac murmur, unspecified: Secondary | ICD-10-CM | POA: Diagnosis present

## 2017-12-04 DIAGNOSIS — Z823 Family history of stroke: Secondary | ICD-10-CM

## 2017-12-04 DIAGNOSIS — Z7951 Long term (current) use of inhaled steroids: Secondary | ICD-10-CM | POA: Diagnosis not present

## 2017-12-04 DIAGNOSIS — E11319 Type 2 diabetes mellitus with unspecified diabetic retinopathy without macular edema: Secondary | ICD-10-CM | POA: Diagnosis present

## 2017-12-04 DIAGNOSIS — Z888 Allergy status to other drugs, medicaments and biological substances status: Secondary | ICD-10-CM | POA: Diagnosis not present

## 2017-12-04 LAB — URINALYSIS, ROUTINE W REFLEX MICROSCOPIC
BILIRUBIN URINE: NEGATIVE
Bacteria, UA: NONE SEEN
GLUCOSE, UA: NEGATIVE mg/dL
Ketones, ur: NEGATIVE mg/dL
LEUKOCYTES UA: NEGATIVE
NITRITE: NEGATIVE
Specific Gravity, Urine: 1.019 (ref 1.005–1.030)
pH: 5 (ref 5.0–8.0)

## 2017-12-04 LAB — CBC
HCT: 26.9 % — ABNORMAL LOW (ref 39.0–52.0)
HEMOGLOBIN: 8.8 g/dL — AB (ref 13.0–17.0)
MCH: 29.1 pg (ref 26.0–34.0)
MCHC: 32.7 g/dL (ref 30.0–36.0)
MCV: 89.1 fL (ref 78.0–100.0)
Platelets: 133 10*3/uL — ABNORMAL LOW (ref 150–400)
RBC: 3.02 MIL/uL — ABNORMAL LOW (ref 4.22–5.81)
RDW: 13.4 % (ref 11.5–15.5)
WBC: 8.4 10*3/uL (ref 4.0–10.5)

## 2017-12-04 LAB — BASIC METABOLIC PANEL
ANION GAP: 10 (ref 5–15)
BUN: 19 mg/dL (ref 6–20)
CALCIUM: 8.3 mg/dL — AB (ref 8.9–10.3)
CO2: 22 mmol/L (ref 22–32)
Chloride: 105 mmol/L (ref 101–111)
Creatinine, Ser: 2.07 mg/dL — ABNORMAL HIGH (ref 0.61–1.24)
GFR, EST AFRICAN AMERICAN: 36 mL/min — AB (ref >60)
GFR, EST NON AFRICAN AMERICAN: 31 mL/min — AB (ref >60)
GLUCOSE: 122 mg/dL — AB (ref 65–99)
POTASSIUM: 3.3 mmol/L — AB (ref 3.5–5.1)
SODIUM: 137 mmol/L (ref 135–145)

## 2017-12-04 LAB — GLUCOSE, CAPILLARY
GLUCOSE-CAPILLARY: 116 mg/dL — AB (ref 65–99)
GLUCOSE-CAPILLARY: 95 mg/dL (ref 65–99)
GLUCOSE-CAPILLARY: 131 mg/dL — AB (ref 65–99)
Glucose-Capillary: 97 mg/dL (ref 65–99)

## 2017-12-04 LAB — CBG MONITORING, ED: GLUCOSE-CAPILLARY: 110 mg/dL — AB (ref 65–99)

## 2017-12-04 LAB — CREATININE, SERUM
Creatinine, Ser: 2.03 mg/dL — ABNORMAL HIGH (ref 0.61–1.24)
GFR calc non Af Amer: 32 mL/min — ABNORMAL LOW (ref >60)
GFR, EST AFRICAN AMERICAN: 37 mL/min — AB (ref >60)

## 2017-12-04 LAB — MAGNESIUM: MAGNESIUM: 1.4 mg/dL — AB (ref 1.7–2.4)

## 2017-12-04 LAB — ECHOCARDIOGRAM COMPLETE
HEIGHTINCHES: 76 in
WEIGHTICAEL: 2836 [oz_av]

## 2017-12-04 LAB — HEMOGLOBIN A1C
Hgb A1c MFr Bld: 5.5 % (ref 4.8–5.6)
Mean Plasma Glucose: 111.15 mg/dL

## 2017-12-04 MED ORDER — HYDRALAZINE HCL 10 MG PO TABS
10.0000 mg | ORAL_TABLET | Freq: Three times a day (TID) | ORAL | Status: DC
  Administered 2017-12-04 – 2017-12-05 (×3): 10 mg via ORAL
  Filled 2017-12-04 (×3): qty 1

## 2017-12-04 MED ORDER — POLYETHYLENE GLYCOL 3350 17 G PO PACK: 17.0000 g | PACK | Freq: Every day | ORAL | Status: DC | PRN

## 2017-12-04 MED ORDER — ONDANSETRON HCL 4 MG/2ML IJ SOLN: 4.0000 mg | Freq: Four times a day (QID) | INTRAMUSCULAR | Status: DC | PRN

## 2017-12-04 MED ORDER — POTASSIUM CHLORIDE CRYS ER 20 MEQ PO TBCR
20.0000 meq | EXTENDED_RELEASE_TABLET | Freq: Two times a day (BID) | ORAL | Status: AC
  Administered 2017-12-04 (×2): 20 meq via ORAL
  Filled 2017-12-04 (×2): qty 1

## 2017-12-04 MED ORDER — ACETAMINOPHEN 325 MG PO TABS
650.0000 mg | ORAL_TABLET | Freq: Four times a day (QID) | ORAL | Status: DC | PRN
  Administered 2017-12-04: 650 mg via ORAL
  Filled 2017-12-04: qty 2

## 2017-12-04 MED ORDER — MAGNESIUM SULFATE 2 GM/50ML IV SOLN
2.0000 g | Freq: Once | INTRAVENOUS | Status: AC
  Administered 2017-12-04: 2 g via INTRAVENOUS
  Filled 2017-12-04: qty 50

## 2017-12-04 MED ORDER — INSULIN ASPART 100 UNIT/ML ~~LOC~~ SOLN: 0.0000 [IU] | Freq: Every day | SUBCUTANEOUS | Status: DC

## 2017-12-04 MED ORDER — ONDANSETRON HCL 4 MG PO TABS: 4.0000 mg | ORAL_TABLET | Freq: Four times a day (QID) | ORAL | Status: DC | PRN

## 2017-12-04 MED ORDER — INSULIN ASPART 100 UNIT/ML ~~LOC~~ SOLN
0.0000 [IU] | Freq: Three times a day (TID) | SUBCUTANEOUS | Status: DC
  Administered 2017-12-04: 2 [IU] via SUBCUTANEOUS
  Administered 2017-12-05 (×2): 1 [IU] via SUBCUTANEOUS
  Administered 2017-12-06: 2 [IU] via SUBCUTANEOUS
  Administered 2017-12-06: 1 [IU] via SUBCUTANEOUS
  Administered 2017-12-07: 2 [IU] via SUBCUTANEOUS
  Administered 2017-12-07: 1 [IU] via SUBCUTANEOUS

## 2017-12-04 MED ORDER — ENOXAPARIN SODIUM 40 MG/0.4ML ~~LOC~~ SOLN
40.0000 mg | Freq: Every day | SUBCUTANEOUS | Status: DC
  Administered 2017-12-04 – 2017-12-07 (×4): 40 mg via SUBCUTANEOUS
  Filled 2017-12-04 (×4): qty 0.4

## 2017-12-04 MED ORDER — ACETAMINOPHEN 650 MG RE SUPP: 650.0000 mg | Freq: Four times a day (QID) | RECTAL | Status: DC | PRN

## 2017-12-04 NOTE — Progress Notes (Signed)
*   Echocardiogram 2D Echocardiogram has been performed.  Cody Silva 12/04/2017, 3:29 PM

## 2017-12-04 NOTE — Progress Notes (Signed)
   Subjective: Pt reports feeling a little better, although having chills, no appetite this am.  Denies feeling cp or SOB.  No diarrhea, n/v  Objective:  Vital signs in last 24 hours: Vitals:   12/04/17 0300 12/04/17 0345 12/04/17 0357 12/04/17 0436  BP: (!) 149/72 (!) 170/79  (!) 196/89  Pulse: (!) 58 (!) 59  75  Resp:    13  Temp:   99.4 F (37.4 C) 99 F (37.2 C)  TempSrc:   Oral Oral  SpO2: 98% 98%  96%  Weight:    177 lb 4 oz (80.4 kg)  Height:    6\' 4"  (1.93 m)   Physical Exam  Constitutional: He is oriented to person, place, and time. He appears well-developed and well-nourished.  Eyes: Right eye exhibits no discharge. Left eye exhibits no discharge. No scleral icterus.  Cardiovascular: Normal rate, regular rhythm and intact distal pulses. Exam reveals no gallop and no friction rub.  Murmur heard.  Systolic murmur is present with a grade of 3/6. Early systolic murmur heard best in tricuspid area  Pulmonary/Chest: Effort normal. No respiratory distress. He has decreased breath sounds in the right lower field and the left lower field. He has no wheezes. He has no rales.  Abdominal: Soft. Bowel sounds are normal. He exhibits no distension and no mass. There is no tenderness. There is no guarding.  Neurological: He is alert and oriented to person, place, and time.  Skin:  R foot wound, callous vs eschar, no active drainage seen, foot painted with betadine and stained, so difficult to appreciate erythema or color of surface.      Assessment/Plan:  Principal Problem:   Fever Active Problems:   Hx of bacteremia   Fever and chills  Fever: likely continuation of prior bloodstream infection from 2 1/2 weeks ago.  Occurred after PICC line removal and pt leaving SNF. Possible new murmur with concern for endocarditis.  Some concern for foot infection as well although on last admission imaging negative for abscess or osteomyelitis and normal ABI's.    -follow up repeat blood  cultures -ECHO ordered -continue ceftriaxone -will order chest x-ray  -urinalysis pending  HTN: chronic essential HTN  -continue carvedilol 25mg  BID, losartan 100mg  daily, clonidine 0.3mg  Q7 days  T2DM w/peripheral neuropathy: Last A1C on 02/2017 = 6.2%, A1C here is 5.5  -SSI sensiitive TID with meals, qHS -Gabapentin 300 mg qHS   CKD stage 3-4: Cr at baseline 2.1   -Continue to monitor -Continue home losartan 100 mg daily   Hx of CVA:  -Continue home atorvastatin 40 mg and clopidogrel 75 mg daily   Dispo: Anticipated discharge in approximately 3-5 day(s).   Katherine Roan, MD 12/04/2017, 6:43 AM Vickki Muff MD PGY-1 Internal Medicine Pager # [phone]

## 2017-12-05 DIAGNOSIS — Z8619 Personal history of other infectious and parasitic diseases: Secondary | ICD-10-CM

## 2017-12-05 DIAGNOSIS — E11621 Type 2 diabetes mellitus with foot ulcer: Secondary | ICD-10-CM

## 2017-12-05 DIAGNOSIS — L97519 Non-pressure chronic ulcer of other part of right foot with unspecified severity: Secondary | ICD-10-CM

## 2017-12-05 DIAGNOSIS — L97509 Non-pressure chronic ulcer of other part of unspecified foot with unspecified severity: Secondary | ICD-10-CM

## 2017-12-05 DIAGNOSIS — M109 Gout, unspecified: Secondary | ICD-10-CM

## 2017-12-05 DIAGNOSIS — R509 Fever, unspecified: Secondary | ICD-10-CM

## 2017-12-05 DIAGNOSIS — L84 Corns and callosities: Secondary | ICD-10-CM

## 2017-12-05 DIAGNOSIS — E1140 Type 2 diabetes mellitus with diabetic neuropathy, unspecified: Secondary | ICD-10-CM

## 2017-12-05 DIAGNOSIS — L089 Local infection of the skin and subcutaneous tissue, unspecified: Secondary | ICD-10-CM

## 2017-12-05 DIAGNOSIS — I10 Essential (primary) hypertension: Secondary | ICD-10-CM

## 2017-12-05 DIAGNOSIS — Z888 Allergy status to other drugs, medicaments and biological substances status: Secondary | ICD-10-CM

## 2017-12-05 DIAGNOSIS — Z833 Family history of diabetes mellitus: Secondary | ICD-10-CM

## 2017-12-05 DIAGNOSIS — Z8249 Family history of ischemic heart disease and other diseases of the circulatory system: Secondary | ICD-10-CM

## 2017-12-05 DIAGNOSIS — Z91048 Other nonmedicinal substance allergy status: Secondary | ICD-10-CM

## 2017-12-05 DIAGNOSIS — Z91011 Allergy to milk products: Secondary | ICD-10-CM

## 2017-12-05 DIAGNOSIS — E11628 Type 2 diabetes mellitus with other skin complications: Secondary | ICD-10-CM

## 2017-12-05 DIAGNOSIS — R011 Cardiac murmur, unspecified: Secondary | ICD-10-CM

## 2017-12-05 LAB — CBC
HCT: 29.5 % — ABNORMAL LOW (ref 39.0–52.0)
HEMOGLOBIN: 10.1 g/dL — AB (ref 13.0–17.0)
MCH: 30.3 pg (ref 26.0–34.0)
MCHC: 34.2 g/dL (ref 30.0–36.0)
MCV: 88.6 fL (ref 78.0–100.0)
Platelets: 145 10*3/uL — ABNORMAL LOW (ref 150–400)
RBC: 3.33 MIL/uL — ABNORMAL LOW (ref 4.22–5.81)
RDW: 13.6 % (ref 11.5–15.5)
WBC: 6.3 10*3/uL (ref 4.0–10.5)

## 2017-12-05 LAB — BASIC METABOLIC PANEL
Anion gap: 10 (ref 5–15)
BUN: 23 mg/dL — AB (ref 6–20)
CHLORIDE: 103 mmol/L (ref 101–111)
CO2: 21 mmol/L — ABNORMAL LOW (ref 22–32)
CREATININE: 2.04 mg/dL — AB (ref 0.61–1.24)
Calcium: 8.3 mg/dL — ABNORMAL LOW (ref 8.9–10.3)
GFR calc Af Amer: 36 mL/min — ABNORMAL LOW (ref >60)
GFR calc non Af Amer: 31 mL/min — ABNORMAL LOW (ref >60)
GLUCOSE: 156 mg/dL — AB (ref 65–99)
Potassium: 3.6 mmol/L (ref 3.5–5.1)
SODIUM: 134 mmol/L — AB (ref 135–145)

## 2017-12-05 LAB — SEDIMENTATION RATE: SED RATE: 32 mm/h — AB (ref 0–16)

## 2017-12-05 LAB — GLUCOSE, CAPILLARY
GLUCOSE-CAPILLARY: 148 mg/dL — AB (ref 65–99)
Glucose-Capillary: 102 mg/dL — ABNORMAL HIGH (ref 65–99)
Glucose-Capillary: 132 mg/dL — ABNORMAL HIGH (ref 65–99)
Glucose-Capillary: 185 mg/dL — ABNORMAL HIGH (ref 65–99)

## 2017-12-05 LAB — MAGNESIUM: MAGNESIUM: 1.7 mg/dL (ref 1.7–2.4)

## 2017-12-05 LAB — C-REACTIVE PROTEIN: CRP: 9.5 mg/dL — ABNORMAL HIGH (ref <1.0)

## 2017-12-05 LAB — PHOSPHORUS: Phosphorus: 2.3 mg/dL — ABNORMAL LOW (ref 2.5–4.6)

## 2017-12-05 MED ORDER — GABAPENTIN 100 MG PO CAPS
100.0000 mg | ORAL_CAPSULE | Freq: Once | ORAL | Status: AC
  Administered 2017-12-05: 100 mg via ORAL
  Filled 2017-12-05: qty 1

## 2017-12-05 MED ORDER — AMLODIPINE BESYLATE 10 MG PO TABS: 10.0000 mg | ORAL_TABLET | Freq: Every day | ORAL | Status: DC

## 2017-12-05 MED ORDER — CLONIDINE HCL 0.3 MG/24HR TD PTWK
0.3000 mg | MEDICATED_PATCH | TRANSDERMAL | Status: DC
  Administered 2017-12-05: 0.3 mg via TRANSDERMAL
  Filled 2017-12-05: qty 1

## 2017-12-05 MED ORDER — K PHOS MONO-SOD PHOS DI & MONO 155-852-130 MG PO TABS
250.0000 mg | ORAL_TABLET | Freq: Once | ORAL | Status: AC
  Administered 2017-12-05: 250 mg via ORAL
  Filled 2017-12-05: qty 1

## 2017-12-05 MED ORDER — SODIUM CHLORIDE 0.9 % IV SOLN
12.5000 mg | Freq: Three times a day (TID) | INTRAVENOUS | Status: DC | PRN
  Administered 2017-12-05 – 2017-12-07 (×3): 12.5 mg via INTRAVENOUS
  Filled 2017-12-05 (×4): qty 0.5

## 2017-12-05 NOTE — Evaluation (Signed)
Physical Therapy Evaluation Patient Details Name: Cody Silva MRN: [id number] DOB: 16-Jun-1946 Today's Date: 12/05/2017   History of Present Illness  Pt is a 72 y.o. male admitted from [facility] 12/03/17 with fever and weakness; likely likely continuation of prior bloodstream infection from 2.5 weeks ago (recent admission 3/10-17 for sepsis secondary to strep, thought to be due to an underlying R foot infection). Also with possible new murmur with concern for endocarditis.  PMH includes HTN, CKD III-IV, multiple CVAs, DM, peripheral neuropathy.     Clinical Impression  Pt presents with an overall decrease in functional mobility secondary to above. PTA, pt recently d/c from hospital to SNF where he was amb with RW and reports indep with ADLs. Pt reports not planning to return to SNF and will be going home. Today, able to amb with RW and intermittent min guard for balance; min guard performing ADLs. Demonstrates some slowed processing and decreased safety awareness. Pt would benefit from continued acute PT services to maximize functional mobility and independence prior to d/c with HHPT services. Will require 24/7 support for safety    Follow Up Recommendations Home health PT;Supervision/Assistance - 24 hour (refusing SNF)    Equipment Recommendations  None recommended by PT    Recommendations for Other Services       Precautions / Restrictions Precautions Precautions: Fall Restrictions Weight Bearing Restrictions: No      Mobility  Bed Mobility Overal bed mobility: Modified Independent             General bed mobility comments: Increased time and effort, especially with scooting hips to EOB; no physical assist required  Transfers Overall transfer level: Needs assistance Equipment used: Rolling walker (2 wheeled) Transfers: Sit to/from Stand Sit to Stand: Min guard;Supervision         General transfer comment: Initial min guard standing from bed with RW and cues  for hand placement. Able to stand from recliner x2 with RW and supervision  Ambulation/Gait Ambulation/Gait assistance: Min guard Ambulation Distance (Feet): 100 Feet Assistive device: Rolling walker (2 wheeled) Gait Pattern/deviations: Step-through pattern;Decreased stride length Gait velocity: Decreased Gait velocity interpretation: Below normal speed for age/gender General Gait Details: Slow, controlled amb with RW and intermittent min guard for balance. Slowed processing requiring intermittent cues for safety  Stairs            Wheelchair Mobility    Modified Rankin (Stroke Patients Only)       Balance Overall balance assessment: Needs assistance   Sitting balance-Leahy Scale: Good       Standing balance-Leahy Scale: Poor                               Pertinent Vitals/Pain Pain Assessment: 0-10 Pain Score: 4  Pain Location: R knee Pain Descriptors / Indicators: Sore Pain Intervention(s): Monitored during session    Home Living Family/patient expects to be discharged to:: Private residence Living Arrangements: Spouse/significant other;Children Available Help at Discharge: Family;Available 24 hours/day Type of Home: House Home Access: Stairs to enter Entrance Stairs-Rails: None Entrance Stairs-Number of Steps: 2 Home Layout: Two level;Bed/bath upstairs Home Equipment: Walker - 4 wheels;Cane - single point;Electric scooter;Hospital bed Additional Comments: Planning to get hospital bed for pt to stay on first floor    Prior Function Level of Independence: Needs assistance   Gait / Transfers Assistance Needed: At SNF, pt using RW for mobility  ADL's / Homemaking Assistance Needed: Pt reports  he did not need assist with ADL at SNF        Hand Dominance   Dominant Hand: Right    Extremity/Trunk Assessment   Upper Extremity Assessment Upper Extremity Assessment: Overall WFL for tasks assessed    Lower Extremity Assessment Lower  Extremity Assessment: Overall WFL for tasks assessed    Cervical / Trunk Assessment Cervical / Trunk Assessment: Kyphotic  Communication   Communication: No difficulties  Cognition Arousal/Alertness: Awake/alert Behavior During Therapy: Flat affect Overall Cognitive Status: No family/caregiver present to determine baseline cognitive functioning Area of Impairment: Following commands;Problem solving;Safety/judgement;Awareness                       Following Commands: Follows one step commands with increased time;Follows multi-step commands inconsistently Safety/Judgement: Decreased awareness of safety;Decreased awareness of deficits Awareness: Emergent Problem Solving: Slow processing;Decreased initiation;Requires verbal cues General Comments: Apparent cognitive deficits      General Comments      Exercises     Assessment/Plan    PT Assessment Patient needs continued PT services  PT Problem List Decreased strength;Decreased mobility;Decreased safety awareness;Decreased balance;Decreased knowledge of use of DME;Decreased activity tolerance       PT Treatment Interventions Gait training;Therapeutic exercise;Patient/family education;Stair training;Balance training;Functional mobility training;DME instruction;Therapeutic activities    PT Goals (Current goals can be found in the Care Plan section)  Acute Rehab PT Goals Patient Stated Goal: Return home PT Goal Formulation: With patient Time For Goal Achievement: 12/19/17 Potential to Achieve Goals: Good    Frequency Min 3X/week   Barriers to discharge        Co-evaluation               AM-PAC PT "6 Clicks" Daily Activity  Outcome Measure Difficulty turning over in bed (including adjusting bedclothes, sheets and blankets)?: A Little Difficulty moving from lying on back to sitting on the side of the bed? : A Little Difficulty sitting down on and standing up from a chair with arms (e.g., wheelchair, bedside  commode, etc,.)?: A Little Help needed moving to and from a bed to chair (including a wheelchair)?: A Little Help needed walking in hospital room?: A Little Help needed climbing 3-5 steps with a railing? : A Little 6 Click Score: 18    End of Session Equipment Utilized During Treatment: Gait belt Activity Tolerance: Patient tolerated treatment well Patient left: in chair;with call bell/phone within reach;with chair alarm set Nurse Communication: Mobility status PT Visit Diagnosis: Other abnormalities of gait and mobility (R26.89)    Time: 9735-3299 PT Time Calculation (min) (ACUTE ONLY): 23 min   Charges:   PT Evaluation $PT Eval Moderate Complexity: 1 Mod     PT G Codes:       Mabeline Caras, PT, DPT Acute Rehab Services  Pager: [place] 12/05/2017, 10:10 AM

## 2017-12-05 NOTE — Evaluation (Addendum)
Occupational Therapy Evaluation Patient Details Name: JULEN RUBERT MRN: [id number] DOB: 11/03/1945 Today's Date: 12/05/2017    History of Present Illness Pt is a 72 y.o. male admitted from [facility] 12/03/17 with fever and weakness; likely likely continuation of prior bloodstream infection from 2.5 weeks ago (recent admission 3/10-17 for sepsis secondary to strep, thought to be due to an underlying R foot infection). Also with possible new murmur with concern for endocarditis.  PMH includes HTN, CKD III-IV, multiple CVAs, DM, peripheral neuropathy.    Clinical Impression   Pt reports he has not required assist with ADL at SNF PTA. Currently pt requires min guard for functional mobility and min guard-light min assist for ADL. Pt presenting with poor safety awareness and flat affect; requires cues for sequencing and initiation of functional activities. Pt reports he will have 24/7 supervision upon return home. Recommending HHOT for follow up to maximize independence and safety with ADL and functional mobility. Pt would benefit from continued skilled OT to address established goals.    Follow Up Recommendations  Home health OT;Supervision/Assistance - 24 hour    Equipment Recommendations  3 in 1 bedside commode    Recommendations for Other Services       Precautions / Restrictions Precautions Precautions: Fall Restrictions Weight Bearing Restrictions: No      Mobility Bed Mobility Overal bed mobility: Modified Independent             General bed mobility comments: Increased time and effort, especially with scooting hips to EOB; no physical assist required  Transfers Overall transfer level: Needs assistance Equipment used: Rolling walker (2 wheeled) Transfers: Sit to/from Stand Sit to Stand: Min guard         General transfer comment: Min guard for safety. Pt with LOB x1 with turning to sit in chair requiring close min guard to regain balance    Balance  Overall balance assessment: Needs assistance Sitting-balance support: Feet supported;No upper extremity supported Sitting balance-Leahy Scale: Good     Standing balance support: Bilateral upper extremity supported Standing balance-Leahy Scale: Poor                             ADL either performed or assessed with clinical judgement   ADL Overall ADL's : Needs assistance/impaired Eating/Feeding: Set up;Sitting   Grooming: Supervision/safety;Sitting;Wash/dry hands   Upper Body Bathing: Min guard;Sitting   Lower Body Bathing: Minimal assistance;Sit to/from stand   Upper Body Dressing : Min guard;Sitting   Lower Body Dressing: Minimal assistance;Sit to/from stand   Toilet Transfer: Min guard;Ambulation;Regular Toilet;RW   Toileting- Water quality scientist and Hygiene: Min guard;Sit to/from stand       Functional mobility during ADLs: Min guard;Rolling walker General ADL Comments: Cues throughout for sequencing and safety; pt with slow processing and decreased initiation of functional tasks     Vision         Perception     Praxis      Pertinent Vitals/Pain Pain Assessment: 0-10 Pain Score: 4  Pain Location: R knee Pain Descriptors / Indicators: Sore Pain Intervention(s): Monitored during session     Hand Dominance Right   Extremity/Trunk Assessment Upper Extremity Assessment Upper Extremity Assessment: Overall WFL for tasks assessed   Lower Extremity Assessment Lower Extremity Assessment: Defer to PT evaluation   Cervical / Trunk Assessment Cervical / Trunk Assessment: Kyphotic   Communication Communication Communication: No difficulties   Cognition Arousal/Alertness: Awake/alert Behavior During Therapy: Flat  affect Overall Cognitive Status: No family/caregiver present to determine baseline cognitive functioning Area of Impairment: Following commands;Problem solving;Safety/judgement;Awareness                       Following  Commands: Follows one step commands with increased time;Follows multi-step commands inconsistently Safety/Judgement: Decreased awareness of safety;Decreased awareness of deficits Awareness: Emergent Problem Solving: Slow processing;Decreased initiation;Requires verbal cues General Comments: Apparent cognitive deficits   General Comments       Exercises     Shoulder Instructions      Home Living Family/patient expects to be discharged to:: Private residence Living Arrangements: Spouse/significant other;Children Available Help at Discharge: Family;Available 24 hours/day Type of Home: House Home Access: Stairs to enter [employer] of Steps: 2 Entrance Stairs-Rails: None Home Layout: Two level;Bed/bath upstairs Alternate Level Stairs-Number of Steps: flight Alternate Level Stairs-Rails: Left Bathroom Shower/Tub: Occupational psychologist: Standard Bathroom Accessibility: Yes   Home Equipment: Environmental consultant - 4 wheels;Cane - single point;Electric scooter;Hospital bed   Additional Comments: Planning to get hospital bed for pt to stay on first floor      Prior Functioning/Environment Level of Independence: Needs assistance  Gait / Transfers Assistance Needed: At SNF, pt using RW for mobility ADL's / Homemaking Assistance Needed: Pt reports he did not need assist with ADL at SNF            OT Problem List: Decreased activity tolerance;Impaired balance (sitting and/or standing);Decreased cognition;Decreased safety awareness;Decreased knowledge of use of DME or AE;Pain      OT Treatment/Interventions: Self-care/ADL training;Energy conservation;DME and/or AE instruction;Therapeutic activities;Patient/family education;Balance training    OT Goals(Current goals can be found in the care plan section) Acute Rehab OT Goals Patient Stated Goal: Return home OT Goal Formulation: With patient Time For Goal Achievement: 12/19/17 Potential to Achieve Goals: Good ADL  Goals Pt Will Perform Grooming: with supervision;standing Pt Will Perform Upper Body Bathing: with set-up;sitting Pt Will Perform Lower Body Bathing: with supervision;sit to/from stand Pt Will Perform Tub/Shower Transfer: Shower transfer;with supervision;ambulating;3 in 1;rolling walker  OT Frequency: Min 2X/week   Barriers to D/C:            Co-evaluation              AM-PAC PT "6 Clicks" Daily Activity     Outcome Measure Help from another person eating meals?: A Little Help from another person taking care of personal grooming?: A Little Help from another person toileting, which includes using toliet, bedpan, or urinal?: A Little Help from another person bathing (including washing, rinsing, drying)?: A Little Help from another person to put on and taking off regular upper body clothing?: A Little Help from another person to put on and taking off regular lower body clothing?: A Little 6 Click Score: 18   End of Session Equipment Utilized During Treatment: Gait belt;Rolling walker  Activity Tolerance: Patient tolerated treatment well Patient left: in chair;with call bell/phone within reach;with chair alarm set  OT Visit Diagnosis: Unsteadiness on feet (R26.81);History of falling (Z91.81)                Time: 0930-1005 (dovetail with PT) OT Time Calculation (min): 35 min  Charges:  OT General Charges $OT Visit: 1 Visit OT Evaluation $OT Eval Moderate Complexity: 1 Mod G-Codes:     Adewale Pucillo A. Ulice Brilliant, M.S., OTR/L Pager: [place] 12/05/2017, 10:23 AM

## 2017-12-05 NOTE — Progress Notes (Signed)
Subjective: Pt reports feeling better this morning, has more of an appetite, still feels cool but no rigors this am.  He denies CP or SOB, no N/V had 3 episodes of nonbloody loose stools yesterday that were brown, not dark colored, but none sense.  It is not abnormal for him to have loose stools occasionally.  They were non painful, and he felt relief afterward.  He did receive some potassium yesterday as well.       Objective:  Vital signs in last 24 hours: Vitals:   12/04/17 1704 12/04/17 2010 12/04/17 2048 12/05/17 0537  BP: (!) 177/89 (!) 175/91  (!) 181/90  Pulse:  78  72  Resp: (!) _0 Temp:  (!) 101.6 F (38.7 C)  98.1 F (36.7 C)  TempSrc:  Oral  Oral  SpO2: 96% 98% 98% 100%  Weight:      Height:       Physical Exam  Constitutional: He is oriented to person, place, and time. He appears well-developed and well-nourished.  Eyes: Right eye exhibits no discharge. Left eye exhibits no discharge. No scleral icterus.  Cardiovascular: Normal rate, regular rhythm and intact distal pulses. Exam reveals no gallop and no friction rub.  Murmur heard.  Systolic murmur is present with a grade of 3/6. Early systolic murmur heard best in tricuspid area  Pulmonary/Chest: Effort normal. No respiratory distress. He has decreased breath sounds in the right lower field and the left lower field. He has no wheezes. He has no rales.  Abdominal: Soft. Bowel sounds are normal. He exhibits no distension and no mass. There is no tenderness. There is no guarding.  Neurological: He is alert and oriented to person, place, and time.  Skin:  R foot wound, callous vs eschar, no active drainage seen, foot painted with betadine and stained, so difficult to appreciate erythema or color of surface.      Assessment/Plan:  Principal Problem:   Fever Active Problems:   Hx of bacteremia   Fever and chills  Fever: likely continuation of prior bloodstream infection from 2 1/2 weeks ago.  Occurred after  PICC line removal and pt leaving SNF. Possible new murmur with concern for endocarditis.  Some concern for foot infection as well although on last admission imaging negative for abscess or osteomyelitis and normal ABI's.    -repeat blood cultures negative x2 days -TTE  with no signs of vegetations, consulted cards for TEE-scheduled for Friday -continue ceftriaxone -chest x-ray with no acute process  -urinalysis demonstrates proteinuria but no signs of infection -ESR and CRP ordered -Will consult with infectious disease about patient's status   HTN: chronic essential HTN  -continue carvedilol 89m BID, losartan 1075mdaily, clonidine 0.63m48m7 days -pt did not have clonidine patch initially, likely had rebound HTN, will replace today and if pt still hypertensive later in the day will add amlodipine.    T2DM w/peripheral neuropathy: Last A1C on 02/2017 = 6.2%, A1C here is 5.5  -SSI sensiitive TID with meals, qHS -Gabapentin 300 mg qHS   CKD stage 3-4: Cr at baseline 2.1, proteinuria on UA and pt with low albumin, concern for nephrotic syndrome  -Continue to monitor -Continue home losartan 100 mg daily -spot urine protein/creatinine ratio ordered   Hx of CVA:  -Continue home atorvastatin 40 mg and clopidogrel 75 mg daily   Dispo: Anticipated discharge in approximately 2-5 day(s).   WinKatherine RoanD 12/05/2017, 7:03 AM BraVickki Muff PGY-1 Internal Medicine  Pager # [phone]

## 2017-12-05 NOTE — Consult Note (Signed)
[name] for Infectious Disease    Date of Admission:  12/03/2017                 Reason for Consult: Fever / Previous Strep pyrogenes bacteremia  Referring Provider: Dareen Piano Primary Care Provider: [facility]   Assessment/Plan:  [name] was re-admitted from previous hospital stay with streptococcus pyogenes bacteremia of undetermined source with continued fevers following completion of 7 days of Penicillin G and then 7 days of Augmentin. Course of 14 days should have resolved the Strep pyogenes infection. Now with continued fevers and undetermined source. New murmur noted with TTE negative and awaiting TEE on 4/5. Chest x-ray with no evidence of pneumonia or cardiopulmonary origin. Blood cultures are no growth to date. He does continue to have a now open wound located on his right foot at the head of his 5th metatarsal which may certainly be the source of infection with concern for osteomyelitis.    1. Agree with TEE to rule out endocarditis.  2. Continue ceftriaxone as this is likely not a Strep pyogenes infection 3. Continue to monitor cultures. 4. MRI of the right foot to rule out osteomyelitis.    Principal Problem:   Fever Active Problems:   Hx of bacteremia   Fever and chills   . atorvastatin  40 mg Oral q1800  . buPROPion  75 mg Oral Q breakfast  . carvedilol  25 mg Oral BID WC  . cloNIDine  0.3 mg Transdermal Weekly  . clopidogrel  75 mg Oral QHS  . enoxaparin (LOVENOX) injection  40 mg Subcutaneous Daily  . gabapentin  300 mg Oral QHS  . insulin aspart  0-5 Units Subcutaneous QHS  . insulin aspart  0-9 Units Subcutaneous TID WC  . latanoprost  1 drop Both Eyes QHS  . losartan  100 mg Oral Daily  . mometasone-formoterol  2 puff Inhalation BID  . montelukast  10 mg Oral QHS  . sertraline  100 mg Oral Daily  . tamsulosin  0.8 mg Oral QHS     HPI: Cody Silva is a 72 y.o. male the previous medical history of stroke, diabetes,  diabetic neuropathy, hypertension, and gout presenting to the emergency room on 4/1 with fever and weakness.  He was most recently admitted on 3/10 and diagnosed with sepsis secondary to Streptococcus pyogenes bacteremia and noted to have a right foot wound.  X-rays and MRI were without evidence of acute osteomyelitis at the time and he was noted to have good circulation with ABIs of the right being 1.28 in the left 1.07.  He was initially started on broad-spectrum antibiotics with vancomycin and Zosyn and narrowed to IV penicillin G and completed a one-week course followed by 1 week of Augmentin with the end date of 3/26. He was discharged from a SNF the day prior to admission and became weak at the time and was unable to stand needing assistance. Prior to discharge in the SNF he was noted to have a 100.6 temperature. There was also note of a new systolic heart murmur which was not noted previously. He is currently receiving ceftriaxone and is scheduled for a TEE to rule out endocarditis. TTE results with no vegetation. Blood cultures drawn on 4/1 with no growth to date currently. His last fever was on 4/2 in the evening at 101.6. There is no evidence of leukocytosis. Chest x-ray with enlarged cardiac silhouette with no evidence of pneumonia.  Review of Systems: Review of Systems  Constitutional: Positive for fever. Negative for diaphoresis.  Respiratory: Negative for cough, shortness of breath and wheezing.   Cardiovascular: Negative for chest pain and leg swelling.  Skin: Negative for rash.     Past Medical History:  Diagnosis Date  . Anemia   . Arthritis    "right arm, right ankle, right side" (10/30/2014)  . Colon polyps   . Diabetic neuropathy ([place])   . Diabetic retinopathy ([place])   . H/O agent Orange exposure   . Headache    "@ least 3 times/wk" (10/30/2014)  . History of blood transfusion 10/30/2014   hematochezia  . History of gout   . Hypertension   . OSA on CPAP    "suppose  to wear mask; I've got a call in for an equipment change" (10/30/2014)  . PTSD (post-traumatic stress disorder)    "service related"  . Seizures ([place])   . Stroke [facility]) 2014   left extremity deficits; facial left  . Type II diabetes mellitus ([facility])     Social History   Tobacco Use  . Smoking status: Never Smoker  . Smokeless tobacco: Never Used  Substance Use Topics  . Alcohol use: Yes    Alcohol/week: 0.6 oz    Types: 1 Cans of beer per week    Comment: 10/30/2014 "might have a beer a couple times/yr"  . Drug use: No    Family History  Problem Relation Age of Onset  . Diabetes Mother   . Breast cancer Mother   . Heart attack Mother        CABG - Age 53  . Kidney disease Mother   . Stroke Brother   . Lung disease Father   . Neurofibromatosis Maternal Uncle   . Throat cancer Brother   . Hypertension Brother     Allergies  Allergen Reactions  . Metformin And Related Diarrhea  . Milk-Related Compounds Diarrhea  . Tape Rash    OBJECTIVE: Blood pressure (!) 181/90, pulse 72, temperature 98.1 F (36.7 C), temperature source Oral, resp. rate 20, height 6\' 4"  (1.93 m), weight 177 lb 4 oz (80.4 kg), SpO2 100 %.  Physical Exam  Constitutional: He is oriented to person, place, and time. No distress.  Seated in the chair, pleasant.  Cardiovascular: Normal rate, regular rhythm and intact distal pulses.  Murmur heard. Pulmonary/Chest: Effort normal and breath sounds normal. No respiratory distress. He has no wheezes. He has no rales. He exhibits no tenderness.  Abdominal: Soft. Bowel sounds are normal. He exhibits no distension.  Neurological: He is alert and oriented to person, place, and time.  Skin: Skin is warm and dry.  Right foot with dark callus formation open on the top with discharge that is primarily serosanguinous with apparent tunneling distally to underneath the lateral 3 toes. There is mild tenderness.   Psychiatric: Affect normal.        Lab Results Lab  Results  Component Value Date   WBC 6.3 12/05/2017   HGB 10.1 (L) 12/05/2017   HCT 29.5 (L) 12/05/2017   MCV 88.6 12/05/2017   PLT 145 (L) 12/05/2017    Lab Results  Component Value Date   CREATININE 2.04 (H) 12/05/2017   BUN 23 (H) 12/05/2017   NA 134 (L) 12/05/2017   K 3.6 12/05/2017   CL 103 12/05/2017   CO2 21 (L) 12/05/2017    Lab Results  Component Value Date   ALT 9 (L) 12/03/2017   AST  14 (L) 12/03/2017   ALKPHOS 64 12/03/2017   BILITOT 0.9 12/03/2017     Microbiology: Recent Results (from the past 240 hour(s))  Culture, blood (routine x 2)     Status: None (Preliminary result)   Collection Time: 12/03/17  9:27 PM  Result Value Ref Range Status   Specimen Description BLOOD LEFT ANTECUBITAL  Final   Special Requests   Final    BOTTLES DRAWN AEROBIC AND ANAEROBIC Blood Culture adequate volume   Culture   Final    NO GROWTH 2 DAYS Performed at [facility], [address] [place], [place] [address]    Report Status PENDING  Incomplete  Culture, blood (routine x 2)     Status: None (Preliminary result)   Collection Time: 12/03/17  9:47 PM  Result Value Ref Range Status   Specimen Description BLOOD RIGHT HAND  Final   Special Requests IN PEDIATRIC BOTTLE Blood Culture adequate volume  Final   Culture   Final    NO GROWTH 2 DAYS Performed at [facility], [place] [address] [place],  [address]    Report Status PENDING  Incomplete     Terri Piedra, [place] for [facility] [phone] Pager  12/05/2017  2:27 PM

## 2017-12-05 NOTE — Progress Notes (Signed)
Internal Medicine Attending:   I saw and examined the patient. I reviewed the resident's note and I agree with the resident's findings and plan as documented in the resident's note.  Patient feels a little better this morning.  His T-max over the last 24 hours was 101.6 F.  Patient was initially admitted for recurrent fevers and weakness after recently completing a course of IV antibiotics for recent bacteremia thought to be secondary to a foot infection.  Repeat blood cultures with no growth x2 days.  2D echo with no signs of endocarditis.  Given his recurrent fevers and history of bacteremia recently I am concerned for endocarditis and will order a TEE to rule this out.  Continue with IV ceftriaxone for now.  Chest x-ray with no evidence of underlying pneumonia.  UA with no evidence of an infection either.  Patient with an elevated ESR and CRP.  Will discuss case with ID regarding further antibiotics and attempting to find a source for this infection.

## 2017-12-06 ENCOUNTER — Inpatient Hospital Stay (HOSPITAL_COMMUNITY): Payer: Medicare Other

## 2017-12-06 DIAGNOSIS — L97511 Non-pressure chronic ulcer of other part of right foot limited to breakdown of skin: Secondary | ICD-10-CM

## 2017-12-06 DIAGNOSIS — E1142 Type 2 diabetes mellitus with diabetic polyneuropathy: Secondary | ICD-10-CM

## 2017-12-06 LAB — CBC
HEMATOCRIT: 29.5 % — AB (ref 39.0–52.0)
Hemoglobin: 9.7 g/dL — ABNORMAL LOW (ref 13.0–17.0)
MCH: 28.8 pg (ref 26.0–34.0)
MCHC: 32.9 g/dL (ref 30.0–36.0)
MCV: 87.5 fL (ref 78.0–100.0)
PLATELETS: 143 10*3/uL — AB (ref 150–400)
RBC: 3.37 MIL/uL — ABNORMAL LOW (ref 4.22–5.81)
RDW: 13.3 % (ref 11.5–15.5)
WBC: 3.5 10*3/uL — ABNORMAL LOW (ref 4.0–10.5)

## 2017-12-06 LAB — BASIC METABOLIC PANEL
Anion gap: 9 (ref 5–15)
BUN: 24 mg/dL — ABNORMAL HIGH (ref 6–20)
CALCIUM: 8.6 mg/dL — AB (ref 8.9–10.3)
CO2: 23 mmol/L (ref 22–32)
CREATININE: 2.05 mg/dL — AB (ref 0.61–1.24)
Chloride: 104 mmol/L (ref 101–111)
GFR calc non Af Amer: 31 mL/min — ABNORMAL LOW (ref >60)
GFR, EST AFRICAN AMERICAN: 36 mL/min — AB (ref >60)
Glucose, Bld: 151 mg/dL — ABNORMAL HIGH (ref 65–99)
Potassium: 3.6 mmol/L (ref 3.5–5.1)
Sodium: 136 mmol/L (ref 135–145)

## 2017-12-06 LAB — GLUCOSE, CAPILLARY
GLUCOSE-CAPILLARY: 122 mg/dL — AB (ref 65–99)
GLUCOSE-CAPILLARY: 151 mg/dL — AB (ref 65–99)
GLUCOSE-CAPILLARY: 155 mg/dL — AB (ref 65–99)
Glucose-Capillary: 121 mg/dL — ABNORMAL HIGH (ref 65–99)

## 2017-12-06 MED ORDER — AMLODIPINE BESYLATE 5 MG PO TABS
5.0000 mg | ORAL_TABLET | Freq: Every day | ORAL | Status: DC
  Administered 2017-12-06 – 2017-12-07 (×2): 5 mg via ORAL
  Filled 2017-12-06 (×2): qty 1

## 2017-12-06 MED ORDER — HYDRALAZINE HCL 25 MG PO TABS
25.0000 mg | ORAL_TABLET | Freq: Three times a day (TID) | ORAL | Status: DC | PRN
  Administered 2017-12-06: 25 mg via ORAL
  Filled 2017-12-06: qty 1

## 2017-12-06 MED ORDER — SODIUM CHLORIDE 0.9 % IV SOLN: INTRAVENOUS | Status: DC

## 2017-12-06 MED ORDER — SILVER SULFADIAZINE 1 % EX CREA
TOPICAL_CREAM | Freq: Every day | CUTANEOUS | Status: DC
  Administered 2017-12-07: 13:00:00 via TOPICAL
  Filled 2017-12-06: qty 85

## 2017-12-06 NOTE — Progress Notes (Signed)
Physical Therapy Treatment Patient Details Name: Cody Silva MRN: [id number] DOB: 1946-08-16 Today's Date: 12/06/2017    History of Present Illness Pt is a 72 y.o. male admitted from [facility] 12/03/17 with fever and weakness; likely likely continuation of prior bloodstream infection from 2.5 weeks ago (recent admission 3/10-17 for sepsis secondary to strep, thought to be due to an underlying R foot infection). Also with possible new murmur with concern for endocarditis.  PMH includes HTN, CKD III-IV, multiple CVAs, DM, peripheral neuropathy.     PT Comments    Pt performed gait and stair training in prep for d/c home. 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  Pt reports he will have assistance at home to climb 2 stair entrance with HHA.  Pt remains appropriate to return home with support from his family.  Plan next session to review stair training.    Follow Up Recommendations  Home health PT;Supervision/Assistance - 24 hour     Equipment Recommendations  None recommended by PT    Recommendations for Other Services       Precautions / Restrictions Precautions Precautions: Fall Precaution Comments: Pt is blind in one eye Restrictions Weight Bearing Restrictions: No    Mobility  Bed Mobility Overal bed mobility: Modified Independent             General bed mobility comments: Increased time and effort, especially with scooting hips to EOB; no physical assist required  Transfers Overall transfer level: Needs assistance Equipment used: Rolling walker (2 wheeled) Transfers: Sit to/from Stand Sit to Stand: Min guard         General transfer comment: Cues for hand placement to and from seated surface and cues to pull RW back toward chair vs leaving out to the side.    Ambulation/Gait Ambulation/Gait assistance: Min guard;Supervision Ambulation Distance (Feet): 100 Feet Assistive  device: Rolling walker (2 wheeled) Gait Pattern/deviations: Step-through pattern;Decreased stride length Gait velocity: Decreased Gait velocity interpretation: Below normal speed for age/gender General Gait Details: Slow, controlled amb with RW and intermittent min guard for balance. Slowed processing requiring intermittent cues for safety   Stairs Stairs: Yes   Stair Management: No rails;Forwards;Backwards Number of Stairs: 2 General stair comments: Cues for sequencing.  Use HHA as patient does not have rails at home.  Pt performed with min guard assistance.    Wheelchair Mobility    Modified Rankin (Stroke Patients Only)       Balance Overall balance assessment: Needs assistance   Sitting balance-Leahy Scale: Good       Standing balance-Leahy Scale: Poor                              Cognition Arousal/Alertness: Awake/alert Behavior During Therapy: Flat affect Overall Cognitive Status: No family/caregiver present to determine baseline cognitive functioning Area of Impairment: Following commands;Problem solving;Safety/judgement;Awareness                       Following Commands: Follows one step commands with increased time;Follows multi-step commands inconsistently Safety/Judgement: Decreased awareness of safety;Decreased awareness of deficits Awareness: Emergent Problem Solving: Slow processing;Decreased initiation;Requires verbal cues General Comments: Apparent cognitive deficits      Exercises      General Comments        Pertinent Vitals/Pain Pain Assessment: 0-10 Pain Score: 5  Pain Location:  R foot Pain Descriptors / Indicators: Sore Pain Intervention(s): Monitored during session;Repositioned    Home Living                      Prior Function            PT Goals (current goals can now be found in the care plan section) Acute Rehab PT Goals Patient Stated Goal: Return home Potential to Achieve Goals:  Good Progress towards PT goals: Progressing toward goals    Frequency    Min 3X/week      PT Plan Current plan remains appropriate    Co-evaluation              AM-PAC PT "6 Clicks" Daily Activity  Outcome Measure  Difficulty turning over in bed (including adjusting bedclothes, sheets and blankets)?: A Little Difficulty moving from lying on back to sitting on the side of the bed? : A Little Difficulty sitting down on and standing up from a chair with arms (e.g., wheelchair, bedside commode, etc,.)?: Unable Help needed moving to and from a bed to chair (including a wheelchair)?: A Little Help needed walking in hospital room?: A Little Help needed climbing 3-5 steps with a railing? : A Little 6 Click Score: 16    End of Session Equipment Utilized During Treatment: Gait belt Activity Tolerance: Patient tolerated treatment well Patient left: in chair;with call bell/phone within reach;with chair alarm set Nurse Communication: Mobility status PT Visit Diagnosis: Other abnormalities of gait and mobility (R26.89)     Time: 6270-3500 PT Time Calculation (min) (ACUTE ONLY): 23 min  Charges:  $Gait Training: 23-37 mins                    G Codes:       Governor Rooks, PTA pager [place] 12/06/2017, 2:10 PM

## 2017-12-06 NOTE — Progress Notes (Signed)
[facility]  Date of Admission:  12/03/2017             ASSESSMENT/PLAN  Mr. Lipinski has a diabetic foot infection that is likely related to his neuropathy. This is likely the source of the fevers that he has been having. MRI was negative for osteomyelitis or septic joint.He has been afebrile and improved with ceftriaxone with no adverse side effects. This is unlikely a case of endocarditis although it is prudent given recent bacteremia to rule out as a source of infection with TEE.   1. Continue ceftriaxone with duration of treatment pending TEE and orthopedics plans.   2. Consulted orthopedics for potential debridement of area and appreciate assistance.  3. We will continue to follow.   Principal Problem:   Diabetic foot infection ([place]) Active Problems:   Hx of bacteremia   Fever   Fever and chills   Type II diabetes mellitus with foot ulcer ([place])   . amLODipine  5 mg Oral Daily  . atorvastatin  40 mg Oral q1800  . buPROPion  75 mg Oral Q breakfast  . carvedilol  25 mg Oral BID WC  . cloNIDine  0.3 mg Transdermal Weekly  . clopidogrel  75 mg Oral QHS  . enoxaparin (LOVENOX) injection  40 mg Subcutaneous Daily  . gabapentin  300 mg Oral QHS  . insulin aspart  0-5 Units Subcutaneous QHS  . insulin aspart  0-9 Units Subcutaneous TID WC  . latanoprost  1 drop Both Eyes QHS  . losartan  100 mg Oral Daily  . mometasone-formoterol  2 puff Inhalation BID  . montelukast  10 mg Oral QHS  . sertraline  100 mg Oral Daily  . tamsulosin  0.8 mg Oral QHS    SUBJECTIVE:  Mr. Kaczmarek has remained afebrile overnight with no new concerns. Blood cultures remain with no growth. MRI of the right foot without evidence of osteomyelitis or septic joint.   Allergies  Allergen Reactions  . Metformin And Related Diarrhea  . Milk-Related Compounds Diarrhea  . Tape Rash     Review of Systems: Review of Systems  Constitutional: Negative for chills and fever.    Respiratory: Negative for cough, shortness of breath and wheezing.   Cardiovascular: Negative for chest pain.  Gastrointestinal: Negative for abdominal pain, constipation, diarrhea, nausea and vomiting.  Skin: Negative for rash.  Neurological: Negative for weakness.      OBJECTIVE: Vitals:   12/05/17 2027 12/06/17 0620 12/06/17 0735 12/06/17 0749  BP: (!) 174/93 (!) 189/89 (!) 176/87 (!) 176/87  Pulse: 72 82  72  Resp: 13 15  14   Temp: 98.9 F (37.2 C) 98.1 F (36.7 C)  98.4 F (36.9 C)  TempSrc: Oral Oral  Oral  SpO2: 100% 98%  100%  Weight:      Height:       Body mass index is 21.58 kg/m.  Physical Exam  Constitutional: He is oriented to person, place, and time and well-developed, well-nourished, and in no distress. No distress.  Cardiovascular: Normal rate, regular rhythm and intact distal pulses. Exam reveals no gallop and no friction rub.  Murmur heard. Pulmonary/Chest: Breath sounds normal. No respiratory distress. He has no wheezes. He has no rales. He exhibits no tenderness.  Musculoskeletal:  Wound appears unchanged from previous.   Neurological: He is alert and oriented to person, place, and time.  Skin: Skin is warm and dry. No rash noted.  Psychiatric: Affect and judgment normal.  Lab Results Lab Results  Component Value Date   WBC 3.5 (L) 12/06/2017   HGB 9.7 (L) 12/06/2017   HCT 29.5 (L) 12/06/2017   MCV 87.5 12/06/2017   PLT 143 (L) 12/06/2017    Lab Results  Component Value Date   CREATININE 2.05 (H) 12/06/2017   BUN 24 (H) 12/06/2017   NA 136 12/06/2017   K 3.6 12/06/2017   CL 104 12/06/2017   CO2 23 12/06/2017    Lab Results  Component Value Date   ALT 9 (L) 12/03/2017   AST 14 (L) 12/03/2017   ALKPHOS 64 12/03/2017   BILITOT 0.9 12/03/2017     Microbiology: Recent Results (from the past 240 hour(s))  Culture, blood (routine x 2)     Status: None (Preliminary result)   Collection Time: 12/03/17  9:27 PM  Result Value Ref  Range Status   Specimen Description BLOOD LEFT ANTECUBITAL  Final   Special Requests   Final    BOTTLES DRAWN AEROBIC AND ANAEROBIC Blood Culture adequate volume   Culture   Final    NO GROWTH 3 DAYS Performed at [facility], [address] [place], [place] [address]    Report Status PENDING  Incomplete  Culture, blood (routine x 2)     Status: None (Preliminary result)   Collection Time: 12/03/17  9:47 PM  Result Value Ref Range Status   Specimen Description BLOOD RIGHT HAND  Final   Special Requests IN PEDIATRIC BOTTLE Blood Culture adequate volume  Final   Culture   Final    NO GROWTH 3 DAYS Performed at [facility], [place] [address] [place], [place] [address]    Report Status PENDING  Incomplete     Terri Piedra, [facility] [phone] Pager  12/06/2017  1:11 PM

## 2017-12-06 NOTE — Progress Notes (Signed)
   Subjective: Pt reports continuing to feel better, has more of an appetite, no chills or fever in last 24 hours.  No SOB or chest pain.    Objective:  Vital signs in last 24 hours: Vitals:   12/05/17 2027 12/06/17 0620 12/06/17 0735 12/06/17 0749  BP: (!) 174/93 (!) 189/89 (!) 176/87 (!) 176/87  Pulse: 72 82  72  Resp: _0 Temp: 98.9 F (37.2 C) 98.1 F (36.7 C)  98.4 F (36.9 C)  TempSrc: Oral Oral  Oral  SpO2: 100% 98%  100%  Weight:      Height:       Physical Exam  Constitutional: He is oriented to person, place, and time. He appears well-developed and well-nourished.  Eyes: Right eye exhibits no discharge. Left eye exhibits no discharge. No scleral icterus.  Cardiovascular: Normal rate, regular rhythm and intact distal pulses. Exam reveals no gallop and no friction rub.  Murmur heard.  Systolic murmur is present with a grade of 3/6. Early systolic murmur heard best in tricuspid area  Pulmonary/Chest: Effort normal. No respiratory distress. He has no decreased breath sounds. He has no wheezes. He has no rales.  Abdominal: Soft. Bowel sounds are normal. He exhibits no distension and no mass. There is no tenderness. There is no guarding.  Neurological: He is alert and oriented to person, place, and time.  Skin:  R foot wound, callous vs eschar, soft underneath calloused area, slightly painful to palpation.      Assessment/Plan:  Principal Problem:   Fever Active Problems:   Hx of bacteremia   Fever and chills   Type II diabetes mellitus with foot ulcer (HCC)   Diabetic foot infection ([place])  Fever: likely continuation of prior bloodstream infection from 2 1/2 weeks ago.  Occurred after PICC line removal and pt leaving SNF. Possible new murmur with concern for endocarditis.  Some concern for foot infection as well although on last admission imaging negative for abscess or osteomyelitis and normal ABI's.    -repeat blood cultures negative  -TTE  with no signs of  vegetations, consulted cards for TEE-scheduled for Friday -continue ceftriaxone -chest x-ray with no acute process  -urinalysis demonstrates proteinuria but no signs of infection -ESR and CRP elevated -ID following appreciate recommendations -ortho consulted for possible debridement of foot as this is the presumed site of infection at this time, appreciate their assistance  HTN: chronic essential HTN  -continue carvedilol 51m BID, losartan 1038mdaily, clonidine 0.15m20m7 days -added amlodipine 5mg37mt still hypertensive will switch to 10mg25morrow.  Will add hydralazine for now.    T2DM w/peripheral neuropathy: Last A1C on 02/2017 = 6.2%, A1C here is 5.5  -SSI sensiitive TID with meals, qHS -Gabapentin 300 mg qHS   CKD stage 3-4: Cr at baseline 2.1, proteinuria on UA and pt with low albumin, concern for nephrotic syndrome  -Continue to monitor -Continue home losartan 100 mg daily -spot urine protein/creatinine ratio ordered yesterday but never collected   Hx of CVA:  -Continue home atorvastatin 40 mg and clopidogrel 75 mg daily   Dispo: Anticipated discharge in approximately 2-5 day(s).   WinfrKatherine Roan4/12/2017, 10:51 AM BrandVickki MuffGY-1 Internal Medicine Pager # [phone]

## 2017-12-06 NOTE — Progress Notes (Signed)
Internal Medicine Attending:   I saw and examined the patient. I reviewed the resident's note and I agree with the resident's findings and plan as documented in the resident's note.  Patient feels better today with no new complaints.  He has remained afebrile over the last 24 hours.  Patient was initially admitted for recurrent fevers and worsening weakness in the setting of recent history of bacteremia (group A strep) secondary to suspected right foot infection.  We will continue with IV ceftriaxone for now.  ID follow-up and recommendations appreciated.  Repeat MRI with no evidence of osteomyelitis but with some dorsal swelling possibly secondary to cellulitis.  Orthopedic follow-up and recommendations appreciated.  Patient will likely need debridement of that foot.  We will follow-up with Dr.Duda.  Patient is also scheduled for TEE in the morning to rule out endocarditis even though this is less likely but given his recent history of bacteremia and murmur noted on exam this will need to be ruled out.  No further work-up at this time.

## 2017-12-06 NOTE — H&P (View-Only) (Signed)
   Subjective: Pt reports continuing to feel better, has more of an appetite, no chills or fever in last 24 hours.  No SOB or chest pain.    Objective:  Vital signs in last 24 hours: Vitals:   12/05/17 2027 12/06/17 0620 12/06/17 0735 12/06/17 0749  BP: (!) 174/93 (!) 189/89 (!) 176/87 (!) 176/87  Pulse: 72 82  72  Resp: 13 15  14  Temp: 98.9 F (37.2 C) 98.1 F (36.7 C)  98.4 F (36.9 C)  TempSrc: Oral Oral  Oral  SpO2: 100% 98%  100%  Weight:      Height:       Physical Exam  Constitutional: He is oriented to person, place, and time. He appears well-developed and well-nourished.  Eyes: Right eye exhibits no discharge. Left eye exhibits no discharge. No scleral icterus.  Cardiovascular: Normal rate, regular rhythm and intact distal pulses. Exam reveals no gallop and no friction rub.  Murmur heard.  Systolic murmur is present with a grade of 3/6. Early systolic murmur heard best in tricuspid area  Pulmonary/Chest: Effort normal. No respiratory distress. He has no decreased breath sounds. He has no wheezes. He has no rales.  Abdominal: Soft. Bowel sounds are normal. He exhibits no distension and no mass. There is no tenderness. There is no guarding.  Neurological: He is alert and oriented to person, place, and time.  Skin:  R foot wound, callous vs eschar, soft underneath calloused area, slightly painful to palpation.      Assessment/Plan:  Principal Problem:   Fever Active Problems:   Hx of bacteremia   Fever and chills   Type II diabetes mellitus with foot ulcer (HCC)   Diabetic foot infection (HCC)  Fever: likely continuation of prior bloodstream infection from 2 1/2 weeks ago.  Occurred after PICC line removal and pt leaving SNF. Possible new murmur with concern for endocarditis.  Some concern for foot infection as well although on last admission imaging negative for abscess or osteomyelitis and normal ABI's.    -repeat blood cultures negative  -TTE  with no signs of  vegetations, consulted cards for TEE-scheduled for Friday -continue ceftriaxone -chest x-ray with no acute process  -urinalysis demonstrates proteinuria but no signs of infection -ESR and CRP elevated -ID following appreciate recommendations -ortho consulted for possible debridement of foot as this is the presumed site of infection at this time, appreciate their assistance  HTN: chronic essential HTN  -continue carvedilol 25mg BID, losartan 100mg daily, clonidine 0.3mg Q7 days -added amlodipine 5mg, pt still hypertensive will switch to 10mg tomorrow.  Will add hydralazine for now.    T2DM w/peripheral neuropathy: Last A1C on 02/2017 = 6.2%, A1C here is 5.5  -SSI sensiitive TID with meals, qHS -Gabapentin 300 mg qHS   CKD stage 3-4: Cr at baseline 2.1, proteinuria on UA and pt with low albumin, concern for nephrotic syndrome  -Continue to monitor -Continue home losartan 100 mg daily -spot urine protein/creatinine ratio ordered yesterday but never collected   Hx of CVA:  -Continue home atorvastatin 40 mg and clopidogrel 75 mg daily   Dispo: Anticipated discharge in approximately 2-5 day(s).   Jaquarious Grey B, MD 12/06/2017, 10:51 AM Brandon Rishi Vicario MD PGY-1 Internal Medicine Pager # [phone] 

## 2017-12-06 NOTE — Consult Note (Signed)
ORTHOPAEDIC CONSULTATION  REQUESTING PHYSICIAN: Aldine Contes, MD  Chief Complaint: Ulceration fifth metatarsal head right foot.  HPI: Cody Silva is a 72 y.o. 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  Patient states that he does go to the [facility] in [place] for debridement and wound care.  Past Medical History:  Diagnosis Date  . Anemia   . Arthritis    "right arm, right ankle, right side" (10/30/2014)  . Colon polyps   . Diabetic neuropathy ([place])   . Diabetic retinopathy ([place])   . H/O agent Orange exposure   . Headache    "@ least 3 times/wk" (10/30/2014)  . History of blood transfusion 10/30/2014   hematochezia  . History of gout   . Hypertension   . OSA on CPAP    "suppose to wear mask; I've got a call in for an equipment change" (10/30/2014)  . PTSD (post-traumatic stress disorder)    "service related"  . Seizures ([place])   . Stroke [facility]) 2014   left extremity deficits; facial left  . Type II diabetes mellitus ([place])    Past Surgical History:  Procedure Laterality Date  . BONE GRAFT HIP ILIAC CREST Left ~ 1976  . CATARACT EXTRACTION W/ INTRAOCULAR LENS  IMPLANT, BILATERAL Bilateral 2014-2015  . EP IMPLANTABLE DEVICE N/A 12/24/2015   Procedure: Loop Recorder Insertion;  Surgeon: Thompson Grayer, MD;  Location: [facility];  Service: Cardiovascular;  Laterality: N/A;  . TUMOR REMOVAL Right ~ 1976   "arm; had to take bone left hip to add to the repair"   Social History   Socioeconomic History  . Marital status: Married    Spouse name: Cody Silva  . Number of children: 4  . Years of education: 50  . Highest education level: Not on file  Occupational History  . Occupation: Retired  Scientific laboratory technician  . Financial resource strain: Not on file  . Food insecurity:    Worry: Not on file    Inability: Not on file  . Transportation needs:    Medical: Not on file    Non-medical: Not on  file  Tobacco Use  . Smoking status: Never Smoker  . Smokeless tobacco: Never Used  Substance and Sexual Activity  . Alcohol use: Yes    Alcohol/week: 0.6 oz    Types: 1 Cans of beer per week    Comment: 10/30/2014 "might have a beer a couple times/yr"  . Drug use: No  . Sexual activity: Yes  Lifestyle  . Physical activity:    Days per week: Not on file    Minutes per session: Not on file  . Stress: Not on file  Relationships  . Social connections:    Talks on phone: Not on file    Gets together: Not on file    Attends religious service: Not on file    Active member of club or organization: Not on file    Attends meetings of clubs or organizations: Not on file    Relationship status: Not on file  Other Topics Concern  . Not on file  Social History Narrative   Patient is married with 4 children   Patient is right handed   Patient has a Water quality scientist degree    Patient 4 cups daily   Retired Engineer, structural. Lives with wife, daughter, cousin in [place].   Independent in ADLs and partially dependent for IADLs.   Ambulates with  cane sometimes due to intermittent right leg weakness.    Family History  Problem Relation Age of Onset  . Diabetes Mother   . Breast cancer Mother   . Heart attack Mother        CABG - Age 35  . Kidney disease Mother   . Stroke Brother   . Lung disease Father   . Neurofibromatosis Maternal Uncle   . Throat cancer Brother   . Hypertension Brother    - negative except otherwise stated in the family history section Allergies  Allergen Reactions  . Metformin And Related Diarrhea  . Milk-Related Compounds Diarrhea  . Tape Rash   Prior to Admission medications   Medication Sig Start Date End Date Taking? Authorizing Provider  acetaminophen (TYLENOL) 500 MG tablet Take 1 tablet (500 mg total) by mouth every 6 (six) hours as needed for mild pain, moderate pain, fever or headache. 11/01/14  Yes Rabbani, Marjan, MD  albuterol (PROVENTIL HFA;VENTOLIN  HFA) 108 (90 Base) MCG/ACT inhaler Inhale 1 puff into the lungs every 6 (six) hours as needed for wheezing or shortness of breath.   Yes [provider]  ammonium lactate (LAC-HYDRIN) 12 % lotion Apply 1 application topically as needed for dry skin.   Yes [provider]  atorvastatin (LIPITOR) 20 MG tablet Take 2 tablets (40 mg total) by mouth daily at 6 PM. 11/18/17  Yes Alphonzo Grieve, MD  budesonide-formoterol (SYMBICORT) 80-4.5 MCG/ACT inhaler Inhale 2 puffs into the lungs 2 (two) times daily.   Yes [provider]  buPROPion (WELLBUTRIN) 75 MG tablet Take 75 mg by mouth daily.   Yes [provider]  Carboxymethylcellulose Sod PF 0.25 % SOLN Place 1 drop into both eyes 4 (four) times daily.   Yes [provider]  carvedilol (COREG) 25 MG tablet Take 25 mg by mouth 2 (two) times daily with a meal.   Yes [provider]  cetirizine (ZYRTEC) 10 MG tablet Take 10 mg by mouth at bedtime as needed for allergies.   Yes [provider]  Cholecalciferol (VITAMIN D3) 2000 units TABS Take 2,000 Units by mouth daily.   Yes [provider]  cloNIDine (CATAPRES - DOSED IN MG/24 HR) 0.3 mg/24hr patch Place 0.3 mg onto the skin every Friday.   Yes [provider]  clopidogrel (PLAVIX) 75 MG tablet Take 1 tablet (75 mg total) by mouth at bedtime. Patient taking differently: Take 75 mg by mouth daily.  11/08/14  Yes Rabbani, Ricarda Frame, MD  dorzolamide-timolol (COSOPT) 22.3-6.8 MG/ML ophthalmic solution Place 1 drop into both eyes as needed (as directed).    Yes [provider]  fluticasone (FLONASE) 50 MCG/ACT nasal spray Place 1 spray into both nostrils daily.   Yes [provider]  gabapentin (NEURONTIN) 300 MG capsule Take 300 mg by mouth at bedtime.    Yes [provider]  insulin aspart (NOVOLOG) 100 UNIT/ML injection Inject 0-9 Units into the skin 3 (three) times daily with meals. CBG 121-150=1 unit, CBG  151-200=2 units, CBG 201-250=3 units; CBG 251-300=5 units, CBG 301-350=7 units, CBG 351-400=9 units Patient taking differently: Inject 0-9 Units into the skin See admin instructions. Inject 0-9 units into the skin three times a day with meals, per sliding scale: BGL: <70 = call MD; 121-150 = 1 unit; 151-200 = 2 units; 201-250 = 3 units; 251-300 = 5 units; 301-350 = 7 units; 351-400 = 9 units 11/18/17  Yes Alphonzo Grieve, MD  ketotifen (ZADITOR) 0.025 % ophthalmic solution  Place 1 drop into both eyes daily as needed (for itching or irritation).    Yes [provider]  latanoprost (XALATAN) 0.005 % ophthalmic solution Place 1 drop into both eyes at bedtime.    Yes [provider]  losartan (COZAAR) 100 MG tablet Take 100 mg by mouth daily.   Yes [provider]  Melatonin 3 MG TABS Take 3 mg by mouth at bedtime.   Yes [provider]  montelukast (SINGULAIR) 10 MG tablet Take 10 mg by mouth at bedtime.   Yes [provider]  sertraline (ZOLOFT) 100 MG tablet Take 100 mg by mouth daily.   Yes [provider]  tamsulosin (FLOMAX) 0.4 MG CAPS capsule Take 0.8 mg by mouth at bedtime.   Yes [provider]  terbinafine (LAMISIL) 1 % cream Apply 1 application topically 2 (two) times daily.   Yes [provider]  tolnaftate (TINACTIN) 1 % powder Apply 1 application topically 2 (two) times daily.   Yes [provider]  carvedilol (COREG) 12.5 MG tablet Take 2 tablets (25 mg total) by mouth 2 (two) times daily with a meal. Patient not taking: Reported on 12/03/2017 11/18/17   Alphonzo Grieve, MD  chlorproMAZINE (THORAZINE) 10 MG tablet Take 1 tablet (10 mg total) by mouth 3 (three) times daily as needed for hiccoughs. Patient not taking: Reported on 12/03/2017 11/18/17   Alphonzo Grieve, MD  feeding supplement, ENSURE ENLIVE, (ENSURE ENLIVE) LIQD Take 237 mLs by mouth 2 (two) times daily after a meal. Patient not taking: Reported on  12/03/2017 09/12/16   Asencion Partridge, MD   Mr Foot Right Wo Contrast  Result Date: 12/06/2017 CLINICAL DATA:  Diabetic patient discharged from the hospital 11/18/2017 after an admission for sepsis possibly secondary to a right foot infection. Continued low-grade fevers. Weakness for 1 day. EXAM: MRI OF THE RIGHT FOREFOOT WITHOUT CONTRAST TECHNIQUE: Multiplanar, multisequence MR imaging of the right forefoot was performed. No intravenous contrast was administered. COMPARISON:  MRI right foot 11/12/2017. Plain films right foot 11/11/2017. FINDINGS: Bones/Joint/Cartilage No bone marrow signal abnormality to suggest osteomyelitis is identified. 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 Ligaments Intact. Muscles and Tendons Increased T2 signal within intrinsic musculature the foot and mild atrophy are most consistent with diabetic myopathy. No intramuscular fluid collection. No tendon tear. Soft tissues Intense subcutaneous edema over the dorsum of the foot has worsened since the prior examination. No focal fluid collection is identified. IMPRESSION: Increased subcutaneous edema over the dorsum of the foot could be due to cellulitis and/or dependent change. No focal fluid collection is suggest abscess is seen. Negative for osteomyelitis or septic joint. 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 Electronically Signed   By: Inge Rise M.D.   On: 12/06/2017 10:31   - pertinent xrays, CT, MRI studies were reviewed and independently interpreted  Positive ROS: All other systems have been reviewed and were otherwise negative with the exception of those mentioned in the HPI and as above.  Physical Exam: General: Alert, no acute distress Psychiatric: Patient is competent for consent with normal mood and affect Lymphatic: No axillary or cervical  lymphadenopathy Cardiovascular: No pedal edema Respiratory: No cyanosis, no use of accessory musculature GI: No organomegaly, abdomen is soft and non-tender  Skin: Examination there is no cellulitis patient does have brawny skin color changes there  is a ulcerative wound over the fifth metatarsal head.  No purulent drainage.  Images:  @ENCIMAGES @   Neurologic: Patient does not have protective sensation bilateral lower extremities.   MUSCULOSKELETAL:  Examination patient is a good dorsalis pedis pulse he has a large callus over ulcer fifth metatarsal head right foot.  After informed consent the callus was debrided of skin and soft tissue back to healthy viable granulation tissue this does not probe down to bone there is no abscess no cellulitis.  Review of the MRI scan shows no definite osteomyelitis though there is a very thin amount of soft tissue covering the bone of the fifth metatarsal head.  The ulcerative area is 2 cm in diameter and 1 mm deep with healthy granulation tissue.  Assessment: Assessment: Diabetic insensate neuropathy with Waggoner grade 1 ulcer over the fifth metatarsal head with no evidence of abscess or osteomyelitis.  Plan: Plan: Would have the patient start Silvadene dressing changes for wound care I will have a Darco shoe placed discussed the importance of minimizing weightbearing on the right foot.  Discussed that with too much weightbearing patient's ulcer could become full-thickness and would develop osteomyelitis in the fifth metatarsal head.  I will follow-up in the office as an outpatient.  Thank you for the consult and the opportunity to see Cody Silva, [place] [phone] 6:23 PM

## 2017-12-06 NOTE — Progress Notes (Signed)
[facility] Infusion Coordinator will follow pt with ID team to support home IV ABX at DC as ordered.  If patient discharges after hours, please call [phone].   Larry Sierras 12/06/2017, 10:31 PM

## 2017-12-06 NOTE — Progress Notes (Signed)
    [facility] has been requested to perform a transesophageal echocardiogram on Cody Silva for bacteremia.  After careful review of history and examination, the risks and benefits of transesophageal echocardiogram have been explained including risks of esophageal damage, perforation (1:10,000 risk), bleeding, pharyngeal hematoma as well as other potential complications associated with conscious sedation including aspiration, arrhythmia, respiratory failure and death. Alternatives to treatment were discussed, questions were answered. Patient is willing to proceed.   Pt is scheduled for TEE tomorrow 12/07/17 at 1400 with Dr. Harrington Challenger. NPO at [place] tonight.  Tami Lin Dezyre Hoefer, [place]  12/06/2017 3:11 PM

## 2017-12-06 NOTE — Consult Note (Signed)
Reason for Consult:Foot ulcer Referring Physician: Devlyn Retter is an 72 y.o. male.  HPI: Cody Silva was admitted with bacteremia of unknown etiology on 3/10. He went to rehab but had to be readmitted on 4/1 with continued fevers and fatigue. His infectious source was suspected to be his foot. He has had a chronic ulceration/callus for several months. He's been having this treated at the [place]. It waxes and wanes but never gets completely better. He has occasional pain with it. MRI done today does not show any bony involvement. ID has asked if he might benefit from formal debridement.  Past Medical History:  Diagnosis Date  . Anemia   . Arthritis    "right arm, right ankle, right side" (10/30/2014)  . Colon polyps   . Diabetic neuropathy ([place])   . Diabetic retinopathy ([place])   . H/O agent Orange exposure   . Headache    "@ least 3 times/wk" (10/30/2014)  . History of blood transfusion 10/30/2014   hematochezia  . History of gout   . Hypertension   . OSA on CPAP    "suppose to wear mask; I've got a call in for an equipment change" (10/30/2014)  . PTSD (post-traumatic stress disorder)    "service related"  . Seizures ([place])   . Stroke [facility]) 2014   left extremity deficits; facial left  . Type II diabetes mellitus ([place])     Past Surgical History:  Procedure Laterality Date  . BONE GRAFT HIP ILIAC CREST Left ~ 1976  . CATARACT EXTRACTION W/ INTRAOCULAR LENS  IMPLANT, BILATERAL Bilateral 2014-2015  . EP IMPLANTABLE DEVICE N/A 12/24/2015   Procedure: Loop Recorder Insertion;  Surgeon: Thompson Grayer, MD;  Location: [place];  Service: Cardiovascular;  Laterality: N/A;  . TUMOR REMOVAL Right ~ 1976   "arm; had to take bone left hip to add to the repair"    Family History  Problem Relation Age of Onset  . Diabetes Mother   . Breast cancer Mother   . Heart attack Mother        CABG - Age 12  . Kidney disease Mother   . Stroke Brother   . Lung disease Father   .  Neurofibromatosis Maternal Uncle   . Throat cancer Brother   . Hypertension Brother     Social History:  reports that he has never smoked. He has never used smokeless tobacco. He reports that he drinks about 0.6 oz of alcohol per week. He reports that he does not use drugs.  Allergies:  Allergies  Allergen Reactions  . Metformin And Related Diarrhea  . Milk-Related Compounds Diarrhea  . Tape Rash    Medications: I have reviewed the patient's current medications.  Results for orders placed or performed during the hospital encounter of 12/03/17 (from the past 48 hour(s))  Glucose, capillary     Status: None   Collection Time: 12/04/17 11:21 AM  Result Value Ref Range   Glucose-Capillary 95 65 - 99 mg/dL  Glucose, capillary     Status: Abnormal   Collection Time: 12/04/17  4:01 PM  Result Value Ref Range   Glucose-Capillary 131 (H) 65 - 99 mg/dL  Glucose, capillary     Status: None   Collection Time: 12/04/17  9:12 PM  Result Value Ref Range   Glucose-Capillary 97 65 - 99 mg/dL   Comment 1 Notify RN    Comment 2 Document in Chart   Urinalysis, Routine w reflex microscopic  Status: Abnormal   Collection Time: 12/04/17 10:07 PM  Result Value Ref Range   Color, Urine YELLOW YELLOW   APPearance HAZY (A) CLEAR   Specific Gravity, Urine 1.019 1.005 - 1.030   pH 5.0 5.0 - 8.0   Glucose, UA NEGATIVE NEGATIVE mg/dL   Hgb urine dipstick SMALL (A) NEGATIVE   Bilirubin Urine NEGATIVE NEGATIVE   Ketones, ur NEGATIVE NEGATIVE mg/dL   Protein, ur >=300 (A) NEGATIVE mg/dL   Nitrite NEGATIVE NEGATIVE   Leukocytes, UA NEGATIVE NEGATIVE   RBC / HPF 0-5 0 - 5 RBC/hpf   WBC, UA 0-5 0 - 5 WBC/hpf   Bacteria, UA NONE SEEN NONE SEEN   Squamous Epithelial / LPF 0-5 (A) NONE SEEN   Mucus PRESENT     Comment: Performed at [facility], [place] [address] [place], [place] [address]  CBC     Status: Abnormal   Collection Time: 12/05/17 12:08 AM  Result Value Ref Range   WBC 6.3 4.0 -  10.5 K/uL   RBC 3.33 (L) 4.22 - 5.81 MIL/uL   Hemoglobin 10.1 (L) 13.0 - 17.0 g/dL   HCT 29.5 (L) 39.0 - 52.0 %   MCV 88.6 78.0 - 100.0 fL   MCH 30.3 26.0 - 34.0 pg   MCHC 34.2 30.0 - 36.0 g/dL   RDW 13.6 11.5 - 15.5 %   Platelets 145 (L) 150 - 400 K/uL    Comment: Performed at [facility], [place] [address], [place], [place] [address]  Basic metabolic panel     Status: Abnormal   Collection Time: 12/05/17 12:08 AM  Result Value Ref Range   Sodium 134 (L) 135 - 145 mmol/L   Potassium 3.6 3.5 - 5.1 mmol/L   Chloride 103 101 - 111 mmol/L   CO2 21 (L) 22 - 32 mmol/L   Glucose, Bld 156 (H) 65 - 99 mg/dL   BUN 23 (H) 6 - 20 mg/dL   Creatinine, Ser 2.04 (H) 0.61 - 1.24 mg/dL   Calcium 8.3 (L) 8.9 - 10.3 mg/dL   GFR calc non Af Amer 31 (L) >60 mL/min   GFR calc Af Amer 36 (L) >60 mL/min    Comment: (NOTE) The eGFR has been calculated using the CKD EPI equation. This calculation has not been validated in all clinical situations. eGFR's persistently <60 mL/min signify possible Chronic Kidney Disease.    Anion gap 10 5 - 15    Comment: Performed at [facility] [address], [place], [place] [address]  Magnesium     Status: None   Collection Time: 12/05/17 12:08 AM  Result Value Ref Range   Magnesium 1.7 1.7 - 2.4 mg/dL    Comment: Performed at [facility], [place] [address] [place], [place] [address]  Phosphorus     Status: Abnormal   Collection Time: 12/05/17 12:08 AM  Result Value Ref Range   Phosphorus 2.3 (L) 2.5 - 4.6 mg/dL    Comment: Performed at [facility] [address], [place],  [address]  Glucose, capillary     Status: Abnormal   Collection Time: 12/05/17  6:21 AM  Result Value Ref Range   Glucose-Capillary 102 (H) 65 - 99 mg/dL   Comment 1 Notify RN    Comment 2 Document in Chart   Sedimentation rate     Status: Abnormal   Collection Time: 12/05/17  9:14 AM  Result Value Ref Range   Sed Rate  32 (H) 0 - 16 mm/hr    Comment:  Performed at [facility], [place] [address], [place], [place] [address]  C-reactive protein     Status: Abnormal   Collection Time: 12/05/17  9:14 AM  Result Value Ref Range   CRP 9.5 (H) <1.0 mg/dL    Comment: Performed at [facility] [address] [place], [place] [address]  Glucose, capillary     Status: Abnormal   Collection Time: 12/05/17 11:13 AM  Result Value Ref Range   Glucose-Capillary 148 (H) 65 - 99 mg/dL   Comment 1 Notify RN   Glucose, capillary     Status: Abnormal   Collection Time: 12/05/17  4:15 PM  Result Value Ref Range   Glucose-Capillary 132 (H) 65 - 99 mg/dL   Comment 1 Notify RN   Glucose, capillary     Status: Abnormal   Collection Time: 12/05/17  8:46 PM  Result Value Ref Range   Glucose-Capillary 185 (H) 65 - 99 mg/dL  CBC     Status: Abnormal   Collection Time: 12/06/17  3:27 AM  Result Value Ref Range   WBC 3.5 (L) 4.0 - 10.5 K/uL   RBC 3.37 (L) 4.22 - 5.81 MIL/uL   Hemoglobin 9.7 (L) 13.0 - 17.0 g/dL   HCT 29.5 (L) 39.0 - 52.0 %   MCV 87.5 78.0 - 100.0 fL   MCH 28.8 26.0 - 34.0 pg   MCHC 32.9 30.0 - 36.0 g/dL   RDW 13.3 11.5 - 15.5 %   Platelets 143 (L) 150 - 400 K/uL    Comment: Performed at [facility], [place] [address], [place], [place] [address]  Basic metabolic panel     Status: Abnormal   Collection Time: 12/06/17  3:27 AM  Result Value Ref Range   Sodium 136 135 - 145 mmol/L   Potassium 3.6 3.5 - 5.1 mmol/L   Chloride 104 101 - 111 mmol/L   CO2 23 22 - 32 mmol/L   Glucose, Bld 151 (H) 65 - 99 mg/dL   BUN 24 (H) 6 - 20 mg/dL   Creatinine, Ser 2.05 (H) 0.61 - 1.24 mg/dL   Calcium 8.6 (L) 8.9 - 10.3 mg/dL   GFR calc non Af Amer 31 (L) >60 mL/min   GFR calc Af Amer 36 (L) >60 mL/min    Comment: (NOTE) The eGFR has been calculated using the CKD EPI equation. This calculation has not been validated in all clinical situations. eGFR's persistently <60 mL/min signify possible Chronic Kidney Disease.    Anion gap 9 5 -  15    Comment: Performed at [facility] [address], [place], [place] [address]  Glucose, capillary     Status: Abnormal   Collection Time: 12/06/17  6:14 AM  Result Value Ref Range   Glucose-Capillary 121 (H) 65 - 99 mg/dL    Mr Foot Right Wo Contrast  Result Date: 12/06/2017 CLINICAL DATA:  Diabetic patient discharged from the hospital 11/18/2017 after an admission for sepsis possibly secondary to a right foot infection. Continued low-grade fevers. Weakness for 1 day. EXAM: MRI OF THE RIGHT FOREFOOT WITHOUT CONTRAST TECHNIQUE: Multiplanar, multisequence MR imaging of the right forefoot was performed. No intravenous contrast was administered. COMPARISON:  MRI right foot 11/12/2017. Plain films right foot 11/11/2017. FINDINGS: Bones/Joint/Cartilage No bone marrow signal abnormality to suggest osteomyelitis is identified. 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 Ligaments Intact. Muscles and Tendons Increased T2 signal within intrinsic musculature the foot and mild atrophy are most consistent with diabetic myopathy. No intramuscular fluid collection. No tendon tear. Soft tissues Intense subcutaneous edema over the dorsum of the foot has worsened since the prior examination. No focal fluid collection is identified. IMPRESSION: Increased subcutaneous edema over the dorsum of the foot could be due to cellulitis and/or dependent change. No focal fluid collection is suggest abscess is seen. Negative for osteomyelitis or septic joint. 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 Electronically Signed   By: Inge Rise M.D.   On: 12/06/2017 10:31    Review of Systems  Constitutional: Positive for fever. Negative for weight loss.  HENT: Negative for ear discharge, ear pain, hearing loss and tinnitus.   Eyes: Negative for blurred  vision, double vision, photophobia and pain.  Respiratory: Negative for cough, sputum production and shortness of breath.   Cardiovascular: Negative for chest pain.  Gastrointestinal: Negative for abdominal pain, nausea and vomiting.  Genitourinary: Negative for dysuria, flank pain, frequency and urgency.  Musculoskeletal: Positive for joint pain (Right foot). Negative for back pain, falls, myalgias and neck pain.  Neurological: Negative for dizziness, tingling, sensory change, focal weakness, loss of consciousness and headaches.  Endo/Heme/Allergies: Does not bruise/bleed easily.  Psychiatric/Behavioral: Negative for depression, memory loss and substance abuse. The patient is not nervous/anxious.    Blood pressure (!) 176/87, pulse 72, temperature 98.4 F (36.9 C), temperature source Oral, resp. rate 14, height '6\' 4"'$  (1.93 m), weight 80.4 kg (177 lb 4 oz), SpO2 100 %. Physical Exam  Constitutional: He appears well-developed and well-nourished. No distress.  HENT:  Head: Normocephalic and atraumatic.  Eyes: Conjunctivae are normal. Right eye exhibits no discharge. Left eye exhibits no discharge. No scleral icterus.  Neck: Normal range of motion.  Cardiovascular: Normal rate and regular rhythm.  Respiratory: Effort normal. No respiratory distress.  Musculoskeletal:  LLE No traumatic wounds, ecchymosis, or rash  Ulceration/callus over plantar 5th MTP joint, superficial fluid collection plantar area just proximal to 4th and 5th digits  No knee or ankle effusion  Knee stable to varus/ valgus and anterior/posterior stress  Sens DPN, SPN, TN intact  Motor EHL, ext, flex, evers 5/5  DP 2+, PT 0, No significant edema  Neurological: He is alert.  Skin: Skin is warm and dry. He is not diaphoretic.  Psychiatric: He has a normal mood and affect. His behavior is normal.      Assessment/Plan: Right foot infection -- Dr. Sharol Given to evaluate the patient this afternoon or in AM. Will make NPO after  MN in case debridement indicated. No change in treatment at this point. ABI's done 3/16 were normal in RLE.    Lisette Abu, PA-C Orthopedic Surgery [phone] 12/06/2017, 11:16 AM

## 2017-12-07 ENCOUNTER — Encounter (HOSPITAL_COMMUNITY): Payer: Self-pay | Admitting: Certified Registered"

## 2017-12-07 ENCOUNTER — Inpatient Hospital Stay (HOSPITAL_COMMUNITY): Payer: Medicare Other | Admitting: Certified Registered"

## 2017-12-07 ENCOUNTER — Other Ambulatory Visit: Payer: Self-pay

## 2017-12-07 ENCOUNTER — Encounter (HOSPITAL_COMMUNITY)
Admission: EM | Disposition: A | Discharge status: Home Health | Payer: Self-pay | Source: Home / Self Care | Attending: Internal Medicine

## 2017-12-07 ENCOUNTER — Inpatient Hospital Stay (HOSPITAL_COMMUNITY): Payer: Medicare Other

## 2017-12-07 DIAGNOSIS — G4733 Obstructive sleep apnea (adult) (pediatric): Secondary | ICD-10-CM

## 2017-12-07 DIAGNOSIS — R7881 Bacteremia: Secondary | ICD-10-CM

## 2017-12-07 DIAGNOSIS — L97509 Non-pressure chronic ulcer of other part of unspecified foot with unspecified severity: Secondary | ICD-10-CM

## 2017-12-07 DIAGNOSIS — Z9989 Dependence on other enabling machines and devices: Secondary | ICD-10-CM

## 2017-12-07 HISTORY — PX: TEE WITHOUT CARDIOVERSION: SHX5443

## 2017-12-07 LAB — BASIC METABOLIC PANEL
ANION GAP: 8 (ref 5–15)
BUN: 26 mg/dL — ABNORMAL HIGH (ref 6–20)
CALCIUM: 8.6 mg/dL — AB (ref 8.9–10.3)
CO2: 24 mmol/L (ref 22–32)
Chloride: 105 mmol/L (ref 101–111)
Creatinine, Ser: 2 mg/dL — ABNORMAL HIGH (ref 0.61–1.24)
GFR, EST AFRICAN AMERICAN: 37 mL/min — AB (ref >60)
GFR, EST NON AFRICAN AMERICAN: 32 mL/min — AB (ref >60)
Glucose, Bld: 123 mg/dL — ABNORMAL HIGH (ref 65–99)
Potassium: 3.6 mmol/L (ref 3.5–5.1)
Sodium: 137 mmol/L (ref 135–145)

## 2017-12-07 LAB — PROTIME-INR
INR: 1.13
Prothrombin Time: 14.4 seconds (ref 11.4–15.2)

## 2017-12-07 LAB — GLUCOSE, CAPILLARY
GLUCOSE-CAPILLARY: 127 mg/dL — AB (ref 65–99)
GLUCOSE-CAPILLARY: 131 mg/dL — AB (ref 65–99)
Glucose-Capillary: 155 mg/dL — ABNORMAL HIGH (ref 65–99)

## 2017-12-07 LAB — CBC
HCT: 30 % — ABNORMAL LOW (ref 39.0–52.0)
Hemoglobin: 9.8 g/dL — ABNORMAL LOW (ref 13.0–17.0)
MCH: 28.7 pg (ref 26.0–34.0)
MCHC: 32.7 g/dL (ref 30.0–36.0)
MCV: 87.7 fL (ref 78.0–100.0)
PLATELETS: 156 10*3/uL (ref 150–400)
RBC: 3.42 MIL/uL — ABNORMAL LOW (ref 4.22–5.81)
RDW: 13.5 % (ref 11.5–15.5)
WBC: 4 10*3/uL (ref 4.0–10.5)

## 2017-12-07 SURGERY — ECHOCARDIOGRAM, TRANSESOPHAGEAL
Anesthesia: Monitor Anesthesia Care

## 2017-12-07 MED ORDER — HYDRALAZINE HCL 20 MG/ML IJ SOLN
10.0000 mg | INTRAMUSCULAR | Status: DC | PRN
  Administered 2017-12-07: 10 mg via INTRAVENOUS
  Filled 2017-12-07: qty 1

## 2017-12-07 MED ORDER — PROPOFOL 500 MG/50ML IV EMUL
INTRAVENOUS | Status: DC | PRN
  Administered 2017-12-07: 100 ug/kg/min via INTRAVENOUS

## 2017-12-07 MED ORDER — LIDOCAINE 2% (20 MG/ML) 5 ML SYRINGE
INTRAMUSCULAR | Status: DC | PRN
  Administered 2017-12-07: 100 mg via INTRAVENOUS

## 2017-12-07 MED ORDER — GLYCOPYRROLATE 0.2 MG/ML IV SOSY
PREFILLED_SYRINGE | INTRAVENOUS | Status: DC | PRN
  Administered 2017-12-07: .2 mg via INTRAVENOUS

## 2017-12-07 MED ORDER — AMLODIPINE BESYLATE 5 MG PO TABS: 5.0000 mg | ORAL_TABLET | Freq: Every day | ORAL | 0 refills | Status: DC

## 2017-12-07 MED ORDER — PROPOFOL 10 MG/ML IV BOLUS
INTRAVENOUS | Status: DC | PRN
  Administered 2017-12-07: 30 mg via INTRAVENOUS
  Administered 2017-12-07: 20 mg via INTRAVENOUS

## 2017-12-07 MED ORDER — LACTATED RINGERS IV SOLN
INTRAVENOUS | Status: DC | PRN
  Administered 2017-12-07: 09:00:00 via INTRAVENOUS

## 2017-12-07 MED ORDER — CLONIDINE 0.3 MG/24HR TD PTWK: MEDICATED_PATCH | TRANSDERMAL | 12 refills | Status: DC

## 2017-12-07 MED ORDER — SILVER SULFADIAZINE 1 % EX CREA: TOPICAL_CREAM | Freq: Every day | CUTANEOUS | 0 refills | Status: DC

## 2017-12-07 MED ORDER — CEPHALEXIN 500 MG PO CAPS: 500.0000 mg | ORAL_CAPSULE | Freq: Four times a day (QID) | ORAL | 0 refills | Status: AC

## 2017-12-07 NOTE — Interval H&P Note (Signed)
History and Physical Interval Note:  12/07/2017 8:19 AM  Cody Silva  has presented today for surgery, with the diagnosis of BACTEREMIA  The various methods of treatment have been discussed with the patient and family. After consideration of risks, benefits and other options for treatment, the patient has consented to  Procedure(s): TRANSESOPHAGEAL ECHOCARDIOGRAM (TEE) (N/A) as a surgical intervention .  The patient's history has been reviewed, patient examined, no change in status, stable for surgery.  I have reviewed the patient's chart and labs.  Questions were answered to the patient's satisfaction.     [name]

## 2017-12-07 NOTE — Progress Notes (Signed)
  Echocardiogram Echocardiogram Transesophageal has been performed.  Cody Silva 12/07/2017, 9:18 AM

## 2017-12-07 NOTE — Progress Notes (Signed)
Internal Medicine Attending:   I saw and examined the patient. I reviewed the resident's note and I agree with the resident's findings and plan as documented in the resident's note.  Patient feels well today with no new complaints.  He was noted to be hypertensive overnight but blood pressure is now normalized.  Patient was initially admitted with fevers in setting of a recent bacteremia thought to be secondary to a foot infection.  Repeat blood cultures with no growth to date.  TEE was done today to rule out endocarditis.  We will follow-up official results.  Continue ceftriaxone for now.  Will discuss with ID regarding transition to oral antibiotics.  Or the follow-up and recommendations appreciated.  Patient did have debridement of his callus at bedside. No indication for further debridement at this time.  Patient to follow-up with orthopedics as an outpatient.  No further workup at this time.  Patient should be stable for discharge home today if his TEE is negative on oral antibiotics.

## 2017-12-07 NOTE — Discharge Summary (Signed)
Name: Cody Silva MRN: [id number] DOB: 1946-05-19 72 y.o. PCP: [facility], [facility]  Date of Admission: 12/03/2017  8:55 PM Date of Discharge: 12/07/2017 12/07/2017 Attending Physician: Aldine Contes  Discharge Diagnosis: 1.  Principal Problem:   Diabetic foot infection ([place]) Active Problems:   Hx of bacteremia   Fever   Fever and chills   Type II diabetes mellitus with foot ulcer ([place])   Discharge Medications: Allergies as of 12/07/2017      Reactions   Metformin And Related Diarrhea   Milk-related Compounds Diarrhea   Tape Rash      Medication List    TAKE these medications   acetaminophen 500 MG tablet Commonly known as:  TYLENOL Take 1 tablet (500 mg total) by mouth every 6 (six) hours as needed for mild pain, moderate pain, fever or headache.   albuterol 108 (90 Base) MCG/ACT inhaler Commonly known as:  PROVENTIL HFA;VENTOLIN HFA Inhale 1 puff into the lungs every 6 (six) hours as needed for wheezing or shortness of breath.   amLODipine 5 MG tablet Commonly known as:  NORVASC Take 1 tablet (5 mg total) by mouth daily.   ammonium lactate 12 % lotion Commonly known as:  LAC-HYDRIN Apply 1 application topically as needed for dry skin.   atorvastatin 20 MG tablet Commonly known as:  LIPITOR Take 2 tablets (40 mg total) by mouth daily at 6 PM.   budesonide-formoterol 80-4.5 MCG/ACT inhaler Commonly known as:  SYMBICORT Inhale 2 puffs into the lungs 2 (two) times daily.   buPROPion 75 MG tablet Commonly known as:  WELLBUTRIN Take 75 mg by mouth daily.   Carboxymethylcellulose Sod PF 0.25 % Soln Place 1 drop into both eyes 4 (four) times daily.   carvedilol 25 MG tablet Commonly known as:  COREG Take 25 mg by mouth 2 (two) times daily with a meal. What changed:  Another medication with the same name was removed. Continue taking this medication, and follow the directions you see here.   cephALEXin 500 MG capsule Commonly known as:  KEFLEX Take  1 capsule (500 mg total) by mouth 4 (four) times daily for 10 days.   cetirizine 10 MG tablet Commonly known as:  ZYRTEC Take 10 mg by mouth at bedtime as needed for allergies.   chlorproMAZINE 10 MG tablet Commonly known as:  THORAZINE Take 1 tablet (10 mg total) by mouth 3 (three) times daily as needed for hiccoughs.   cloNIDine 0.3 mg/24hr patch Commonly known as:  CATAPRES - Dosed in mg/24 hr Replace old patch with new patch every wednesday What changed:    how much to take  how to take this  when to take this  additional instructions   clopidogrel 75 MG tablet Commonly known as:  PLAVIX Take 1 tablet (75 mg total) by mouth at bedtime. What changed:  when to take this   dorzolamide-timolol 22.3-6.8 MG/ML ophthalmic solution Commonly known as:  COSOPT Place 1 drop into both eyes as needed (as directed).   feeding supplement (ENSURE ENLIVE) Liqd Take 237 mLs by mouth 2 (two) times daily after a meal.   fluticasone 50 MCG/ACT nasal spray Commonly known as:  FLONASE Place 1 spray into both nostrils daily.   gabapentin 300 MG capsule Commonly known as:  NEURONTIN Take 300 mg by mouth at bedtime.   insulin aspart 100 UNIT/ML injection Commonly known as:  novoLOG Inject 0-9 Units into the skin 3 (three) times daily with meals. CBG 121-150=1 unit, CBG 151-200=2  units, CBG 201-250=3 units; CBG 251-300=5 units, CBG 301-350=7 units, CBG 351-400=9 units What changed:    when to take this  additional instructions   ketotifen 0.025 % ophthalmic solution Commonly known as:  ZADITOR Place 1 drop into both eyes daily as needed (for itching or irritation).   latanoprost 0.005 % ophthalmic solution Commonly known as:  XALATAN Place 1 drop into both eyes at bedtime.   losartan 100 MG tablet Commonly known as:  COZAAR Take 100 mg by mouth daily.   Melatonin 3 MG Tabs Take 3 mg by mouth at bedtime.   montelukast 10 MG tablet Commonly known as:  SINGULAIR Take 10 mg  by mouth at bedtime.   sertraline 100 MG tablet Commonly known as:  ZOLOFT Take 100 mg by mouth daily.   silver sulfADIAZINE 1 % cream Commonly known as:  SILVADENE Apply topically daily.   tamsulosin 0.4 MG Caps capsule Commonly known as:  FLOMAX Take 0.8 mg by mouth at bedtime.   terbinafine 1 % cream Commonly known as:  LAMISIL Apply 1 application topically 2 (two) times daily.   tolnaftate 1 % powder Commonly known as:  TINACTIN Apply 1 application topically 2 (two) times daily.   Vitamin D3 2000 units Tabs Take 2,000 Units by mouth daily.            Durable Medical Equipment  (From admission, onward)        Start     Ordered   12/07/17 0000  For home use only DME 3 n 1     12/07/17 1535      Disposition and follow-up:   Mr.Momin W Agne was discharged from [facility] in Stable condition.  At the hospital follow up visit please address:  Diabetic foot wound  -needs follow up appointment with Dr. Sharol Given in one week -monitor for signs of systemic infection in the mean time -will need to finish 10 day course of keflex as perscribed  Proteinuria  -incidental finding on UA during infectious workup -pt should be worked up for nephrotic syndrome   2.  Labs / imaging needed at time of follow-up: cbc, BMP, urine protein/cr ratio  3.  Pending labs/ test needing follow-up: none  Follow-up Appointments: Follow-up Information    Newt Minion, MD Follow up in 1 week(s).   Specialty:  Orthopedic Surgery Contact information: [place] [address] [phone]        [facility] Follow up in 1 week(s).   Why:  please follow up with your primary care doctor in the next 1-2 weeks Contact information: [place] [address] [phone]        [facility] - Dme Follow up.   Why:  3n1 arranged- to be delivered to room prior to discharge Contact  information: [address] [place] [address] [phone]        [facility] Follow up.   Specialty:  [place] Why:  [facility] services resumed- (RN/PT/OT/SW)- they will call to set up home visits Contact information: [place] [address] [place] Hospital Course by problem list: Principal Problem:   Diabetic foot infection ([place]) Active Problems:   Hx of bacteremia   Fever   Fever and chills   Type II diabetes mellitus with foot ulcer ([place])   Patient presented to the emergency department with  concern of persistence of a prior bloodstream infection from 2-1/2 weeks prior to his arrival versus some new infectious process.  He arrived with signs and symptoms concerning for early sepsis.  His workup was extensive, TTE and TEE were performed out of concern for endocarditis which were both negative.  Chest imaging showed no signs of a pneumonia.  His urine studies did not point toward UTI and he had no urinary symptoms.  His repeat blood cultures were negative.  He has had continued problems with a diabetic foot wound that was thought to be the source of his infection during his first admission.  Infectious disease was consulted.  They ordered another MRI that showed no osteomyelitis some soft tissue swelling that was nonspecific with mention of possible cellulitis.  Ultimately, it was felt that this foot wound may have introduced another infectious process into Mr. Milderd Meager which was again treated with IV abx.  Orthopedic surgery was consulted and performed a bedside debridement of the wound and agreed to see the patient in their clinic for further management.  Infectious disease recommended continuing keflex for about 10 more days.  The patient was discharged with resolution of his infectious process and close follow up.     Discharge Vitals:   BP 110/61   Pulse 74   Temp 98.2 F (36.8 C) (Oral)   Resp (!) 0   Ht 6\' 4"   (1.93 m)   Wt 177 lb 4 oz (80.4 kg)   SpO2 100%   BMI 21.58 kg/m   Pertinent Labs, Studies, and Procedures:  CBC Latest Ref Rng & Units 12/07/2017 12/06/2017 12/05/2017  WBC 4.0 - 10.5 K/uL 4.0 3.5(L) 6.3  Hemoglobin 13.0 - 17.0 g/dL 9.8(L) 9.7(L) 10.1(L)  Hematocrit 39.0 - 52.0 % 30.0(L) 29.5(L) 29.5(L)  Platelets 150 - 400 K/uL 156 143(L) 145(L)   BMP Latest Ref Rng & Units 12/07/2017 12/06/2017 12/05/2017  Glucose 65 - 99 mg/dL 123(H) 151(H) 156(H)  BUN 6 - 20 mg/dL 26(H) 24(H) 23(H)  Creatinine 0.61 - 1.24 mg/dL 2.00(H) 2.05(H) 2.04(H)  Sodium 135 - 145 mmol/L 137 136 134(L)  Potassium 3.5 - 5.1 mmol/L 3.6 3.6 3.6  Chloride 101 - 111 mmol/L 105 104 103  CO2 22 - 32 mmol/L 24 23 21(L)  Calcium 8.9 - 10.3 mg/dL 8.6(L) 8.6(L) 8.3(L)   MRI of Right Foot  CLINICAL DATA:  Diabetic patient discharged from the hospital 11/18/2017 after an admission for sepsis possibly secondary to a right foot infection. Continued low-grade fevers. Weakness for 1 day.   EXAM: MRI OF THE RIGHT FOREFOOT WITHOUT CONTRAST   TECHNIQUE: Multiplanar, multisequence MR imaging of the right forefoot was performed. No intravenous contrast was administered.   COMPARISON:  MRI right foot 11/12/2017. Plain films right foot 11/11/2017.   FINDINGS: Bones/Joint/Cartilage   No bone marrow signal abnormality to suggest osteomyelitis is identified. 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   Ligaments   Intact.   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 No intramuscular fluid collection. No tendon tear.   Soft tissues   Intense subcutaneous edema over the dorsum of the foot has worsened since the prior examination. No focal fluid collection is identified.   IMPRESSION: Increased subcutaneous edema  over the dorsum of the foot could be due to cellulitis and/or dependent change. No  focal fluid collection is suggest abscess is seen.   Negative for osteomyelitis or septic joint.   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     Electronically Signed   By: Inge Rise M.D.   On: 12/06/2017 10:31  EXAM: CHEST - 2 VIEW   COMPARISON:  11/11/2017   FINDINGS: Loop recorder projects over anterior LEFT chest.   Enlargement of cardiac silhouette.   Mediastinal contours and pulmonary vascularity normal.   Lungs clear.   No acute infiltrate, pleural effusion or pneumothorax.   Bones unremarkable.   IMPRESSION: Enlargement of cardiac silhouette.   No acute abnormalities.     Electronically Signed   By: Lavonia Dana M.D.   On: 12/04/2017 11:13   TEE   Study Result   Result status: Final result                               *[facility]*                         [address], [place] [address]                            [phone]   ------------------------------------------------------------------- Transesophageal Echocardiography   Patient:    Coby, [place] MR #:       [id number] Study Date: 12/07/2017 Gender:     M Age:        72 Height:     193 cm Weight:     80.5 kg BSA:        2.07 m^2 Pt. Status: Room:       4E19C    PERFORMING   [name], M.D.  Elease Etienne [id number]  [place] [id number]  SONOGRAPHER  Roseanna Rainbow  ORDERING     [facility], [place]    Ledora Bottcher   cc:   -------------------------------------------------------------------   ------------------------------------------------------------------- Indications:      Bacteremia 790.7.   ------------------------------------------------------------------- History:   PMH:  Seizures. Agent  orange exposure. CKD.  Syncope and fever.  Transient ischemic attack.  Stroke.  Risk factors: Diabetes mellitus.   ------------------------------------------------------------------- Study data:   Study status:  Routine.  Consent:  The risks, benefits, and alternatives to the procedure were explained to the patient and informed consent was obtained.  Procedure:  The patient reported no pain pre or post test. Initial setup. The patient was brought to the laboratory. Surface ECG leads were monitored. Sedation. Conscious sedation was administered by anesthesiology staff. Transesophageal echocardiography. Topical anesthesia was obtained using viscous lidocaine. An adult multiplane transesophageal probe was inserted by the attending cardiologistwithout difficulty. Image quality was adequate.  Study completion:  The patient tolerated the procedure well. There were no complications.  Administered medications:   Propofol, 80mg .     Diagnostic transesophageal echocardiography.  2D and color Doppler.  Birthdate:  Patient birthdate: 1946/06/19.  Age:  Patient is 72 yr old.  Sex:  Gender: male.    BMI: 21.6 kg/m^2.  Blood pressure:     184/80  Patient status:  Inpatient.  Study date: Study date: 12/07/2017. Study time: 08:52 AM.  Location: Endoscopy.   -------------------------------------------------------------------   ------------------------------------------------------------------- Left ventricle:  LV is hypertrophied LVEF is normal.   ------------------------------------------------------------------- Aortic valve:  AV is normal Mild AI.   ------------------------------------------------------------------- Aorta:  Minimal plaquing in thoracic aorta.   ------------------------------------------------------------------- Mitral valve:  MV is normal No significant MR.   ------------------------------------------------------------------- Left atrium:   No evidence of thrombus in the  atrial cavity or appendage.   ------------------------------------------------------------------- Atrial septum:  No PFO by color Doppler or with injection of agitated saline.   ------------------------------------------------------------------- Right ventricle:  RVEF is normal.   ------------------------------------------------------------------- Tricuspid valve:  TV is normal Trace TR.   ------------------------------------------------------------------- Post procedure conclusions Ascending Aorta:   - Minimal plaquing in thoracic aorta.   ------------------------------------------------------------------- Prepared and Electronically Authenticated by   [name], M.D. 2019-04-05T23:29:49   Blood culture results  Component6d ago Specimen DescriptionBLOOD RIGHT HAND Special RequestsIN PEDIATRIC BOTTLE Blood Culture adequate volume Culture  NO GROWTH 5 DAYS  Performed at [facility], [place] [address], [place], [place] [address]   Report Status04/02/2018 FINAL Resulting [facility]   Status:  Final result   Visible to patient:  No (Not Released) Next appt:  12/18/2017 at 01:55 PM in Cardiology ([facility] Device Remotes)   Ref Range & Units 4d ago 36yr ago  Sed Rate 0 - 16 mm/hr 32High          Urinalysis    Component Value Date/Time   COLORURINE YELLOW 12/04/2017 2207   APPEARANCEUR HAZY (A) 12/04/2017 2207   LABSPEC 1.019 12/04/2017 2207   PHURINE 5.0 12/04/2017 2207   GLUCOSEU NEGATIVE 12/04/2017 2207   HGBUR SMALL (A) 12/04/2017 2207   BILIRUBINUR NEGATIVE 12/04/2017 2207   KETONESUR NEGATIVE 12/04/2017 2207   PROTEINUR >=300 (A) 12/04/2017 2207   UROBILINOGEN 1.0 11/02/2014 2301   NITRITE NEGATIVE 12/04/2017 2207   LEUKOCYTESUR NEGATIVE 12/04/2017 2207      Discharge Instructions: Discharge Instructions    Call MD for:   Complete by:  As directed    Call MD for:  difficulty breathing, headache or visual disturbances   Complete by:  As  directed    Call MD for:  extreme fatigue   Complete by:  As directed    Call MD for:  hives   Complete by:  As directed    Call MD for:  persistant dizziness or light-headedness   Complete by:  As directed    Call MD for:  persistant nausea and vomiting   Complete by:  As directed    Call MD for:  redness, tenderness, or signs of infection (pain, swelling, redness, odor or green/yellow discharge around incision site)   Complete by:  As directed    Call MD for:  severe uncontrolled pain   Complete by:  As directed    Call MD for:  temperature >100.4   Complete by:  As directed    Diet - low sodium heart healthy   Complete by:  As directed    Discharge instructions   Complete by:  As directed    Mr. Hillery, we found no source of infection other than your foot as a cause to why you became febrile and began to get ill again.  We consulted with our infectious disease doctors and our orthopedic doctors who want you to take 10 more days of antibiotics which I have prescribed for  you and to follow up with Dr. Sharol Given in about one week.  It will be very important to use the silver sulfadiazene cream prescribed and change your dressings as you were instructed.  Additionally, try to keep weight off that foot and wear the boot you were given when walking.  Please follow up with your primary care doctor to further assess your overall health and keep an eye on how your blood pressure is doing as it was high when you came in.  We added an additional blood pressure medication called amlodipine and the dosage may or may not need to be increased.  Additionally, we have ordered home health physical therapy and a nurse to help you care for your wound and to help get you stronger and used to walking with the boot.  They will be there to help you in the next 2-3 days.   Face-to-face encounter (required for Medicare/Medicaid patients)   Complete by:  As directed    I Vickki Muff certify that this patient is under  my care and that I, or a nurse practitioner or physician's assistant working with me, had a face-to-face encounter that meets the physician face-to-face encounter requirements with this patient on 12/07/2017. The encounter with the patient was in whole, or in part for the following medical condition(s) which is the primary reason for home health care (List medical condition): Diabetic foot infection   The encounter with the patient was in whole, or in part, for the following medical condition, which is the primary reason for home health care:  Diabetic Foot Infection   I certify that, based on my findings, the following services are medically necessary home health services:   Nursing Physical therapy     Reason for Medically Necessary Home Health Services:   Skilled Nursing- Changes in Medication/Medication Management Skilled Nursing- Complex Wound Care Therapy- Gait Training, Transfer Training and Stair Training Therapy- Instruction on use of Assistive Device for Ambulation on all Surfaces     My clinical findings support the need for the above services:   Pain interferes with ambulation/mobility Unsafe ambulation due to balance issues     Further, I certify that my clinical findings support that this patient is homebound due to:   Ambulates short distances less than 300 feet Unsafe ambulation due to balance issues     For home use only DME 3 n 1   Complete by:  As directed    Home Health   Complete by:  As directed    To provide the following care/treatments:   PT OT RN Social work     Increase activity slowly   Complete by:  As directed       Signed: Katherine Roan, MD 12/09/2017, 4:56 PM

## 2017-12-07 NOTE — Transfer of Care (Signed)
Immediate Anesthesia Transfer of Care Note  Patient: Cody Silva  Procedure(s) Performed: TRANSESOPHAGEAL ECHOCARDIOGRAM (TEE) (N/A )  Patient Location: Endoscopy Unit  Anesthesia Type:MAC  Level of Consciousness: drowsy  Airway & Oxygen Therapy: Patient Spontanous Breathing and Patient connected to nasal cannula oxygen  Post-op Assessment: Report given to RN and Post -op Vital signs reviewed and stable  Post vital signs: Reviewed and stable  Last Vitals:  Vitals Value Taken Time  BP 98/51 12/07/2017  9:19 AM  Temp    Pulse 80 12/07/2017  9:19 AM  Resp 12 12/07/2017  9:19 AM  SpO2 100 % 12/07/2017  9:19 AM  Vitals shown include unvalidated device data.  Last Pain:  Vitals:   12/07/17 0814  TempSrc: Oral  PainSc: 0-No pain         Complications: No apparent anesthesia complications

## 2017-12-07 NOTE — Anesthesia Procedure Notes (Signed)
Procedure Name: MAC Date/Time: 12/07/2017 8:57 AM Performed by: Imagene Riches, CRNA Pre-anesthesia Checklist: Patient identified, Emergency Drugs available, Suction available and Patient being monitored Patient Re-evaluated:Patient Re-evaluated prior to induction Oxygen Delivery Method: Nasal cannula Preoxygenation: Pre-oxygenation with 100% oxygen Induction Type: IV induction

## 2017-12-07 NOTE — Care Management Note (Addendum)
Case Management Note Marvetta Gibbons RN, BSN Unit 4E-Case Manager [phone]  Patient Details  Name: Cody Silva MRN: [id number] Date of Birth: 1946-02-10  Subjective/Objective:   Pt admitted with DM foot infection/sepsis                   Action/Plan: PTA pt lived at home with spouse- per PT/OT evals recommendations for [facility] therapy- however in speaking with pt- pt does not want [facility] services and declines any referral for [facility] services at this time- no HH orders placed- pt does report that he has a RW at home but need 3n1- order for DME-3n1 placed- notified Jermaine with [facility] for 3n1- to be delivered to room prior to discharge- pt states he will have someone 24/7 at home.  Pt goes to the [facility] for primary care. - discussed transportation home- pt is to call family to see about ride home and let bedside RN know if he does not have ride so that [facility] can assist.  Update-1630- spoke with bedside RN Katharine Look- pt was active with [facility] per wife conversation with Katharine Look on living SNF recently- wife is wanting continued services- spoke with Ennis Forts at [facility] and confirmed that they did have referral on pt however were not able to start services before pt was readmitted- will ask MD to place orders to resume [place] services-RN/PT/OT/SW.   Expected Discharge Date:  12/07/17               Expected Discharge Plan:  Home/Self Care  In-House Referral:  NA  Discharge planning Services  CM Consult  Post Acute Care Choice:  Durable Medical Equipment Choice offered to:  Patient  DME Arranged:  3-N-1 DME Agency:  [place] Arranged:  RN, PT, OT, [place] Agency:  [facility]  Status of Service:  Completed, signed off  If discussed at [employer], dates discussed:    Discharge Disposition: home/home health   Additional Comments:  Dawayne Patricia, RN 12/07/2017, 3:53 PM

## 2017-12-07 NOTE — Progress Notes (Signed)
Cody Silva for Infectious Disease  Date of Admission:  12/03/2017            ASSESSMENT/PLAN  Cody Silva diabetic wound infection was debrided beside by Dr. Sharol Given. He has no vegetations noted on the TEE. He continues to be at increased risk for osteomyelitis, however right now appears to be confined to the soft tissue. He has remained afebrile with no leukocytosis and is tolerating the ceftriaxone with no adverse side effects.   1. Continue ceftriaxone while inpatient and discharge with a 10 day course of Keflex (500 mg qid). No additional follow up with ID needed.  2. Continue Silvadene dressing changes and Darco shoe per orthopedics. 3. May be benefit from wound care referral.    Available as needed during the remainder of his hospitalization.   Principal Problem:   Diabetic foot infection ([place]) Active Problems:   Hx of bacteremia   Fever   Fever and chills   Type II diabetes mellitus with foot ulcer ([place])   . amLODipine  5 mg Oral Daily  . atorvastatin  40 mg Oral q1800  . buPROPion  75 mg Oral Q breakfast  . carvedilol  25 mg Oral BID WC  . cloNIDine  0.3 mg Transdermal Weekly  . clopidogrel  75 mg Oral QHS  . enoxaparin (LOVENOX) injection  40 mg Subcutaneous Daily  . gabapentin  300 mg Oral QHS  . insulin aspart  0-5 Units Subcutaneous QHS  . insulin aspart  0-9 Units Subcutaneous TID WC  . latanoprost  1 drop Both Eyes QHS  . losartan  100 mg Oral Daily  . mometasone-formoterol  2 puff Inhalation BID  . montelukast  10 mg Oral QHS  . sertraline  100 mg Oral Daily  . silver sulfADIAZINE   Topical Daily  . tamsulosin  0.8 mg Oral QHS    SUBJECTIVE:  Afebrile overnight. Completed TEE this morning with no evidence of endocarditis. Sleepy from procedural medication.   Allergies  Allergen Reactions  . Metformin And Related Diarrhea  . Milk-Related Compounds Diarrhea  . Tape Rash     Review of Systems: Review of Systems  Constitutional: Negative for  chills, fever and malaise/fatigue.  Respiratory: Negative for cough and shortness of breath.   Cardiovascular: Negative for chest pain.  Gastrointestinal: Negative for abdominal pain, diarrhea, nausea and vomiting.  Skin: Negative for rash.    OBJECTIVE: Vitals:   12/07/17 0921 12/07/17 0930 12/07/17 0935 12/07/17 1038  BP: (!) 98/51 (!) 89/65 (!) 96/57 128/64  Pulse: 81 80 79 74  Resp: 11 17 10    Temp: 98.9 F (37.2 C)     TempSrc: Oral     SpO2: 100% 100% 100%   Weight:      Height:       Body mass index is 21.58 kg/m.  Physical Exam  Constitutional: He is well-developed, well-nourished, and in no distress. No distress.  Resting.   Cardiovascular: Normal rate, regular rhythm and intact distal pulses. Exam reveals no friction rub.  Murmur heard. Pulmonary/Chest: Effort normal and breath sounds normal. No respiratory distress. He has no wheezes. He has no rales. He exhibits no tenderness.  Skin: Skin is warm and dry. No rash noted.  Psychiatric: Affect normal.    Lab Results Lab Results  Component Value Date   WBC 4.0 12/07/2017   HGB 9.8 (L) 12/07/2017   HCT 30.0 (L) 12/07/2017   MCV 87.7 12/07/2017   PLT 156 12/07/2017  Lab Results  Component Value Date   CREATININE 2.00 (H) 12/07/2017   BUN 26 (H) 12/07/2017   NA 137 12/07/2017   K 3.6 12/07/2017   CL 105 12/07/2017   CO2 24 12/07/2017    Lab Results  Component Value Date   ALT 9 (L) 12/03/2017   AST 14 (L) 12/03/2017   ALKPHOS 64 12/03/2017   BILITOT 0.9 12/03/2017     Microbiology: Recent Results (from the past 240 hour(s))  Culture, blood (routine x 2)     Status: None (Preliminary result)   Collection Time: 12/03/17  9:27 PM  Result Value Ref Range Status   Specimen Description BLOOD LEFT ANTECUBITAL  Final   Special Requests   Final    BOTTLES DRAWN AEROBIC AND ANAEROBIC Blood Culture adequate volume   Culture   Final    NO GROWTH 3 DAYS Performed at [facility], [place] [address], [place], [place] [address]    Report Status PENDING  Incomplete  Culture, blood (routine x 2)     Status: None (Preliminary result)   Collection Time: 12/03/17  9:47 PM  Result Value Ref Range Status   Specimen Description BLOOD RIGHT HAND  Final   Special Requests IN PEDIATRIC BOTTLE Blood Culture adequate volume  Final   Culture   Final    NO GROWTH 3 DAYS Performed at [facility], [place] [address], [place],  [address]    Report Status PENDING  Incomplete     Terri Piedra, [facility] Pager  12/07/2017  11:44 AM

## 2017-12-07 NOTE — Op Note (Signed)
TEE Pt sedated by anesthesia   AV normal  Mild AI TV normal  Trace TR PV normal  Mild PI MV normal  No signif MR LA, LAA without masses LV is hypertrophied   Normal LVEF RVEF is normal  Minimal plaquing of thoracic aorta  No PFO by color doppler or with injection of agitated saline   Procedure without complication

## 2017-12-07 NOTE — Anesthesia Preprocedure Evaluation (Signed)
Anesthesia Evaluation  Patient identified by MRN, date of birth, ID band Patient awake    Reviewed: Allergy & Precautions, NPO status , Patient's Chart, lab work & pertinent test results  Airway Mallampati: II  TM Distance: >3 FB Neck ROM: Full    Dental no notable dental hx. (+) Missing   Pulmonary sleep apnea ,    Pulmonary exam normal breath sounds clear to auscultation       Cardiovascular hypertension, Pt. on medications Normal cardiovascular exam Rhythm:Regular Rate:Normal     Neuro/Psych Anxiety Depression CVA, Residual Symptoms negative psych ROS   GI/Hepatic negative GI ROS, Neg liver ROS,   Endo/Other  diabetes, Insulin Dependent  Renal/GU negative Renal ROS  negative genitourinary   Musculoskeletal negative musculoskeletal ROS (+)   Abdominal   Peds negative pediatric ROS (+)  Hematology negative hematology ROS (+)   Anesthesia Other Findings   Reproductive/Obstetrics negative OB ROS                             Anesthesia Physical Anesthesia Plan  ASA: II  Anesthesia Plan: MAC   Post-op Pain Management:    Induction: Intravenous  PONV Risk Score and Plan: 1 and Ondansetron  Airway Management Planned: Nasal Cannula  Additional Equipment:   Intra-op Plan:   Post-operative Plan:   Informed Consent: I have reviewed the patients History and Physical, chart, labs and discussed the procedure including the risks, benefits and alternatives for the proposed anesthesia with the patient or authorized representative who has indicated his/her understanding and acceptance.   Dental advisory given  Plan Discussed with: CRNA  Anesthesia Plan Comments:         Anesthesia Quick Evaluation

## 2017-12-07 NOTE — Progress Notes (Addendum)
   Subjective: Pt reports continuing to feel better,  no chills or fever in last 24 hours.  No SOB or chest pain.  Had some hypertension overnight, attribute this to pt not using cpap.    Objective:  Vital signs in last 24 hours: Vitals:   12/07/17 0352 12/07/17 0355 12/07/17 0405 12/07/17 0535  BP: (!) 210/106 (!) 203/112 (!) 210/106 (!) 170/81  Pulse: 77     Resp: _0 Temp: 97.8 F (36.6 C)     TempSrc: Oral     SpO2: 100% 100% 100% 100%  Weight:      Height:       Physical Exam  Constitutional: He is oriented to person, place, and time. He appears well-developed and well-nourished.  Eyes: Right eye exhibits no discharge. Left eye exhibits no discharge. No scleral icterus.  Cardiovascular: Normal rate, regular rhythm and intact distal pulses. Exam reveals no gallop and no friction rub.  Murmur heard.  Systolic murmur is present with a grade of 3/6. Early systolic murmur heard best in tricuspid area  Pulmonary/Chest: Effort normal. No respiratory distress. He has no decreased breath sounds. He has no wheezes. He has no rales.  Abdominal: Soft. Bowel sounds are normal. He exhibits no distension and no mass. There is no tenderness. There is no guarding.  Neurological: He is alert and oriented to person, place, and time.  Skin:  R foot wound, calloused, soft underneath calloused area, slightly painful to palpation.      Assessment/Plan:  Principal Problem:   Diabetic foot infection ([place]) Active Problems:   Hx of bacteremia   Fever   Fever and chills   Type II diabetes mellitus with foot ulcer ([facility])  Fever: likely continuation of prior bloodstream infection from 2 1/2 weeks ago.  Occurred after PICC line removal and pt leaving SNF. Possible new murmur with concern for endocarditis.  Some concern for foot infection as well although on last admission imaging negative for abscess or osteomyelitis and normal ABI's.    -repeat blood cultures negative  -TTE  with no signs  of vegetations, consulted cards for TEE performed 12/07/17 shows no vegetations -continue ceftriaxone -chest x-ray with no acute process  -urinalysis demonstrates proteinuria but no signs of infection -ESR and CRP elevated -ID following appreciate recommendations, will sort out possibility of abx therapy on discharge -ortho consulted for  debridement of foot did some bedside debridement, given boot to wear taking pressure of metatarsals, silver sulfadiazine wound care treatment and close follow up   HTN: chronic essential HTN  -continue carvedilol 9m BID, losartan 1047mdaily, clonidine 0.60m85m7 days -amlodipine 38m59mday, PRN hydral -pt has terrible sleep apnea,will restart cpap and this should help immensely.    T2DM w/peripheral neuropathy: Last A1C on 02/2017 = 6.2%, A1C here is 5.5  -SSI sensiitive TID with meals, qHS -Gabapentin 300 mg qHS   CKD stage 3-4: Cr at baseline 2.1, proteinuria on UA and pt with low albumin, concern for nephrotic syndrome  -Continue to monitor -Continue home losartan 100 mg daily -spot urine protein/creatinine ratio ordered  but never collected will address today   Hx of CVA:  -Continue home atorvastatin 40 mg and clopidogrel 75 mg daily  OSA: likely severe, on cpap at home  -ordered cpap for pt   Dispo: Anticipated discharge in approximately 1-2 days.   WinfKatherine Silva 12/07/2017, 7:38 AM [id number]1 Internal Medicine Pager # [phone]

## 2017-12-07 NOTE — Anesthesia Postprocedure Evaluation (Signed)
Anesthesia Post Note  Patient: Cody Silva  Procedure(s) Performed: TRANSESOPHAGEAL ECHOCARDIOGRAM (TEE) (N/A )     Patient location during evaluation: PACU Anesthesia Type: MAC Level of consciousness: awake and alert Pain management: pain level controlled Vital Signs Assessment: post-procedure vital signs reviewed and stable Respiratory status: spontaneous breathing, nonlabored ventilation, respiratory function stable and patient connected to nasal cannula oxygen Cardiovascular status: stable and blood pressure returned to baseline Postop Assessment: no apparent nausea or vomiting Anesthetic complications: no    Last Vitals:  Vitals:   12/07/17 0930 12/07/17 0935  BP: (!) 89/65 (!) 96/57  Pulse: 80 79  Resp: 17 10  Temp:    SpO2: 100% 100%    Last Pain:  Vitals:   12/07/17 0935  TempSrc:   PainSc: 0-No pain                 [name]

## 2017-12-07 NOTE — Progress Notes (Signed)
OT Cancellation Note  Patient Details Name: Cody Silva MRN: [id number] DOB: 07/29/1946   Cancelled Treatment:    Reason Eval/Treat Not Completed: Patient at procedure or test/ unavailable(ENDO). OT will continue to follow for treatment  Jaci Carrel 12/07/2017, 8:15 AM  Hulda Humphrey OTR/L [phone]

## 2017-12-07 NOTE — Progress Notes (Signed)
   12/07/17 0352  Vitals  Temp 97.8 F (36.6 C)  Temp Source Oral  BP (!) 210/106  MAP (mmHg) 135  BP Location Left Arm  BP Method Automatic  Patient Position (if appropriate) Lying  Pulse Rate 77  Pulse Rate Source Monitor  ECG Heart Rate 78  Cardiac Rhythm NSR  Resp 16   Bp checked multiple times in both arms paged R smith MD on call who ordered 10 mg hydralazine q4 prn will continue to monitor

## 2017-12-08 LAB — CULTURE, BLOOD (ROUTINE X 2)
CULTURE: NO GROWTH
Culture: NO GROWTH
SPECIAL REQUESTS: ADEQUATE
Special Requests: ADEQUATE

## 2017-12-09 ENCOUNTER — Encounter (HOSPITAL_COMMUNITY): Payer: Self-pay | Admitting: Internal Medicine

## 2017-12-13 ENCOUNTER — Encounter: Payer: Self-pay | Admitting: Cardiology

## 2017-12-18 ENCOUNTER — Encounter: Payer: Medicare Other | Admitting: *Deleted

## 2017-12-19 ENCOUNTER — Telehealth (INDEPENDENT_AMBULATORY_CARE_PROVIDER_SITE_OTHER): Payer: Self-pay | Admitting: Orthopedic Surgery

## 2017-12-19 NOTE — Telephone Encounter (Signed)
I called and sw Harrell Gave to advise per Dr. Jess Barters consult note on 12/06/17 pt should wear a darco shoe and minimize wtb. Pt has ulcer to 5th MTH and excess wtb could cause the ulcer to become full thickness/ osteo. Will call with any other questions.

## 2017-12-19 NOTE — Telephone Encounter (Signed)
Christopher from [facility] called asking for clarification on the weight bearing status for the patient. CB # [phone]

## 2017-12-22 LAB — CUP PACEART REMOTE DEVICE CHECK
Implantable Pulse Generator Implant Date: 20170421
MDC IDC SESS DTM: 20190315203837

## 2017-12-24 ENCOUNTER — Ambulatory Visit (INDEPENDENT_AMBULATORY_CARE_PROVIDER_SITE_OTHER): Payer: Medicare Other | Admitting: Orthopedic Surgery

## 2017-12-24 ENCOUNTER — Encounter (INDEPENDENT_AMBULATORY_CARE_PROVIDER_SITE_OTHER): Payer: Self-pay | Admitting: Orthopedic Surgery

## 2017-12-24 DIAGNOSIS — L97511 Non-pressure chronic ulcer of other part of right foot limited to breakdown of skin: Secondary | ICD-10-CM | POA: Diagnosis not present

## 2017-12-24 NOTE — Progress Notes (Signed)
Office Visit Note   Patient: Cody Silva           Date of Birth: 04/25/1946           MRN: [id number] Visit Date: 12/24/2017              Requested by: [facility] [address] [place], [place] [address] PCP: [facility]  Chief Complaint  Patient presents with  . Right Foot - Follow-up      HPI: Patient is a 72 year old gentleman who is seen for evaluation for the right foot Waggoner grade 1 ulcers beneath the first and fifth metatarsal heads.  Patient denies any systemic symptoms denies any drainage.  Assessment & Plan: Visit Diagnoses:  1. Right foot ulcer, limited to breakdown of skin ([place])     Plan: Patient will continue with home health dressing changes twice a week.  Recommend that he wash his foot daily with soap and water apply Silvadene plus dry dressing continue with the Darco shoe at follow-up evaluate for advancement to his regular shoewear with some felt relieving donuts.  Patient may be weightbearing as tolerated with a Darco shoe minimize activities.  Follow-Up Instructions: Return in about 3 weeks (around 01/14/2018).   Ortho Exam  Patient is alert, oriented, no adenopathy, well-dressed, normal affect, normal respiratory effort. Examination patient has a palpable dorsalis pedis pulse he has a Waggoner grade 1 ulcer beneath the first and fifth metatarsal head.  After informed consent a 10 blade knife was used to debride the skin and soft tissue back to healthy viable granulation tissue silver nitrate was used for hemostasis a dry dressing was applied both ulcers were 10 mm in diameter 2 mm deep and did not probe to bone or tendon.  Imaging: No results found. No images are attached to the encounter.  Labs: Lab Results  Component Value Date   HGBA1C 5.5 12/04/2017   HGBA1C 6.2 (H) 02/15/2017   HGBA1C 6.7 (H) 01/10/2017   ESRSEDRATE 32 (H) 12/05/2017   ESRSEDRATE 37 (H) 09/10/2016   CRP 9.5 (H) 12/05/2017   CRP 0.4 02/15/2017   REPTSTATUS 12/08/2017 FINAL 12/03/2017   CULT  12/03/2017    NO GROWTH 5 DAYS Performed at [facility], [place] [address] [place],  [address]    LABORGA GROUP A STREP (S.PYOGENES) ISOLATED 11/11/2017    @LABSALLVALUES (HGBA1)@  There is no height or weight on file to calculate BMI.  Orders:  No orders of the defined types were placed in this encounter.  No orders of the defined types were placed in this encounter.    Procedures: No procedures performed  Clinical Data: No additional findings.  ROS:  All other systems negative, except as noted in the HPI. Review of Systems  Objective: Vital Signs: There were no vitals taken for this visit.  Specialty Comments:  No specialty comments available.  PMFS History: Patient Active Problem List   Diagnosis Date Noted  . Type II diabetes mellitus with foot ulcer ([place]) 12/05/2017  . Diabetic foot infection ([place])   . Fever 12/04/2017  . Fever and chills 12/04/2017  . Hx of bacteremia   . Wound eschar of foot   . Acute sepsis ([place]) 11/12/2017  . Scrotal pain   . Sepsis ([place]) 11/11/2017  . Atrophy of muscle of right hand 04/04/2017  . Protein-calorie malnutrition, severe 01/19/2017  . Altered mental status 01/09/2017  . Facial droop 01/09/2017  . Cerebral thrombosis with cerebral infarction 01/09/2017  .  Seizures ([place])   . Other hyperlipidemia   . OSA (obstructive sleep apnea) 09/12/2016  . Weight loss 09/12/2016  . Malnutrition of moderate degree 09/11/2016  . Syncope 09/10/2016  . H/O agent Orange exposure 12/24/2015  . Type 2 diabetes with nephropathy ([place])   . Near syncope 12/22/2015  . History of TIAs 09/14/2015  . CKD stage 3 due to type 2 diabetes mellitus ([place]) 09/14/2015  . Acute lower GI bleeding 10/30/2014  . History of colonic polyps 10/30/2014  . Depression 10/30/2014  . Symptomatic anemia 10/30/2014  . AKI (acute kidney injury) ([place]) 10/30/2014  . Ataxic gait  09/21/2014  . History of CVA (cerebrovascular accident) 02/22/2013  . Abnormal brain scan 02/21/2013  . Dizziness 02/21/2013  . Weakness 02/21/2013  . Diabetes mellitus ([place]) 02/21/2013  . Hypertension 02/21/2013   Past Medical History:  Diagnosis Date  . Anemia   . Arthritis    "right arm, right ankle, right side" (10/30/2014)  . Colon polyps   . Diabetic neuropathy ([place])   . Diabetic retinopathy ([place])   . H/O agent Orange exposure   . Headache    "@ least 3 times/wk" (10/30/2014)  . History of blood transfusion 10/30/2014   hematochezia  . History of gout   . Hypertension   . OSA on CPAP    "suppose to wear mask; I've got a call in for an equipment change" (10/30/2014)  . PTSD (post-traumatic stress disorder)    "service related"  . Seizures ([place])   . Stroke [facility]) 2014   left extremity deficits; facial left  . Type II diabetes mellitus ([facility])     Family History  Problem Relation Age of Onset  . Diabetes Mother   . Breast cancer Mother   . Heart attack Mother        CABG - Age 13  . Kidney disease Mother   . Stroke Brother   . Lung disease Father   . Neurofibromatosis Maternal Uncle   . Throat cancer Brother   . Hypertension Brother     Past Surgical History:  Procedure Laterality Date  . BONE GRAFT HIP ILIAC CREST Left ~ 1976  . CATARACT EXTRACTION W/ INTRAOCULAR LENS  IMPLANT, BILATERAL Bilateral 2014-2015  . EP IMPLANTABLE DEVICE N/A 12/24/2015   Procedure: Loop Recorder Insertion;  Surgeon: Thompson Grayer, MD;  Location: [facility];  Service: Cardiovascular;  Laterality: N/A;  . TEE WITHOUT CARDIOVERSION N/A 12/07/2017   Procedure: TRANSESOPHAGEAL ECHOCARDIOGRAM (TEE);  Surgeon: Fay Records, MD;  Location: [facility];  Service: Cardiovascular;  Laterality: N/A;  . TUMOR REMOVAL Right ~ 1976   "arm; had to take bone left hip to add to the repair"   Social History   Occupational History  . Occupation: Retired  Tobacco Use  . Smoking status: Never  Smoker  . Smokeless tobacco: Never Used  Substance and Sexual Activity  . Alcohol use: Yes    Alcohol/week: 0.6 oz    Types: 1 Cans of beer per week    Comment: 10/30/2014 "might have a beer a couple times/yr"  . Drug use: No  . Sexual activity: Yes

## 2017-12-29 ENCOUNTER — Other Ambulatory Visit: Payer: Self-pay | Admitting: Internal Medicine

## 2018-01-07 ENCOUNTER — Encounter: Payer: Self-pay | Admitting: Physical Therapy

## 2018-01-07 NOTE — Therapy (Unsigned)
[facility] [address] [place], [place], [address] Phone: [phone]   Fax:  [phone]  Patient Details  Name: Cody Silva MRN: [id number] Date of Birth: 05-14-1946 Referring Provider:  No ref. provider found  Encounter Date: 01/07/2018   PHYSICAL THERAPY DISCHARGE SUMMARY  Visits from Start of Care: 6  Current functional level related to goals / functional outcomes: Unable to assess due to has not returned since last visit   Remaining deficits: Unable to assess due to has not returned since last visit    Education / Equipment: Unable to assess due to has not returned since last visit   Plan: Patient agrees to discharge.  Patient goals were not met. Patient is being discharged due to a change in medical status.  ?????        Rexanne Mano, PT 01/07/2018, 8:41 AM  [facility] [address] [place], [place], [address] Phone: [phone]   Fax:  [phone]

## 2018-01-09 ENCOUNTER — Other Ambulatory Visit: Payer: Self-pay | Admitting: Internal Medicine

## 2018-01-14 ENCOUNTER — Ambulatory Visit (INDEPENDENT_AMBULATORY_CARE_PROVIDER_SITE_OTHER): Payer: Medicare Other | Admitting: Orthopedic Surgery

## 2018-01-14 ENCOUNTER — Encounter (INDEPENDENT_AMBULATORY_CARE_PROVIDER_SITE_OTHER): Payer: Self-pay | Admitting: Orthopedic Surgery

## 2018-01-14 DIAGNOSIS — L97511 Non-pressure chronic ulcer of other part of right foot limited to breakdown of skin: Secondary | ICD-10-CM | POA: Diagnosis not present

## 2018-01-14 NOTE — Progress Notes (Signed)
Office Visit Note   Patient: Cody Silva           Date of Birth: 05-31-1946           MRN: [id number] Visit Date: 01/14/2018              Requested by: [facility] [address] [place], [place] [address] PCP: [facility]  Chief Complaint  Patient presents with  . Right Foot - Follow-up, Pain      HPI: Patient is a 72 year old gentleman with diabetic insensate neuropathy presents in follow-up for ulceration to the right foot with a right heel ulcer first metatarsal head ulcer and fifth metatarsal head ulcer.  Patient is currently in a.  Assessment & Plan: Visit Diagnoses:  1. Right foot ulcer, limited to breakdown of skin ([place])     Plan: Patient will advance to a protective sneaker continue with washing his foot with soap and water daily using a Band-Aid and Silvadene over the first and fifth metatarsal head ulcers.  Follow-Up Instructions: Return in about 3 weeks (around 02/04/2018).   Ortho Exam  Patient is alert, oriented, no adenopathy, well-dressed, normal affect, normal respiratory effort. Examination patient has a strong dorsalis pedis and posterior tibial pulse.  The ulcer over the heel has completely resolved with no open wound.  Examination patient has callus over the ulcers over the first and fifth metatarsal head.  After informed consent a 10 blade knife was used to debride the skin and soft tissue from both of these ulcers.  There was good petechial bleeding.  The great toe first metatarsal head ulcer is 2 cm in diameter 1 mm deep in the fifth metatarsal head ulcer is 10 mm in diameter and 3 mm deep.  Both have healthy granulation tissue at the base there is no exposed bone or tendon no drainage no cellulitis no odor no signs of infection.  Imaging: No results found. No images are attached to the encounter.  Labs: Lab Results  Component Value Date   HGBA1C 5.5 12/04/2017   HGBA1C 6.2 (H) 02/15/2017   HGBA1C 6.7  (H) 01/10/2017   ESRSEDRATE 32 (H) 12/05/2017   ESRSEDRATE 37 (H) 09/10/2016   CRP 9.5 (H) 12/05/2017   CRP 0.4 02/15/2017   REPTSTATUS 12/08/2017 FINAL 12/03/2017   CULT  12/03/2017    NO GROWTH 5 DAYS Performed at [facility], [place] [address], [place], [place] [address]    LABORGA GROUP A STREP (S.PYOGENES) ISOLATED 11/11/2017     Lab Results  Component Value Date   ALBUMIN 2.7 (L) 12/03/2017   ALBUMIN 3.6 11/11/2017   ALBUMIN 3.2 (L) 01/18/2017    There is no height or weight on file to calculate BMI.  Orders:  No orders of the defined types were placed in this encounter.  No orders of the defined types were placed in this encounter.    Procedures: No procedures performed  Clinical Data: No additional findings.  ROS:  All other systems negative, except as noted in the HPI. Review of Systems  Objective: Vital Signs: There were no vitals taken for this visit.  Specialty Comments:  No specialty comments available.  PMFS History: Patient Active Problem List   Diagnosis Date Noted  . Type II diabetes mellitus with foot ulcer ([place]) 12/05/2017  . Diabetic foot infection ([place])   . Fever 12/04/2017  . Fever and chills 12/04/2017  . Hx of bacteremia   . Wound eschar of foot   .  Acute sepsis ([place]) 11/12/2017  . Scrotal pain   . Sepsis ([place]) 11/11/2017  . Atrophy of muscle of right hand 04/04/2017  . Protein-calorie malnutrition, severe 01/19/2017  . Altered mental status 01/09/2017  . Facial droop 01/09/2017  . Cerebral thrombosis with cerebral infarction 01/09/2017  . Seizures ([place])   . Other hyperlipidemia   . OSA (obstructive sleep apnea) 09/12/2016  . Weight loss 09/12/2016  . Malnutrition of moderate degree 09/11/2016  . Syncope 09/10/2016  . H/O agent Orange exposure 12/24/2015  . Type 2 diabetes with nephropathy ([place])   . Near syncope 12/22/2015  . History of TIAs 09/14/2015  . CKD stage 3 due to type 2 diabetes mellitus ([place]) 09/14/2015    . Acute lower GI bleeding 10/30/2014  . History of colonic polyps 10/30/2014  . Depression 10/30/2014  . Symptomatic anemia 10/30/2014  . AKI (acute kidney injury) ([place]) 10/30/2014  . Ataxic gait 09/21/2014  . History of CVA (cerebrovascular accident) 02/22/2013  . Abnormal brain scan 02/21/2013  . Dizziness 02/21/2013  . Weakness 02/21/2013  . Diabetes mellitus ([place]) 02/21/2013  . Hypertension 02/21/2013   Past Medical History:  Diagnosis Date  . Anemia   . Arthritis    "right arm, right ankle, right side" (10/30/2014)  . Colon polyps   . Diabetic neuropathy ([place])   . Diabetic retinopathy ([place])   . H/O agent Orange exposure   . Headache    "@ least 3 times/wk" (10/30/2014)  . History of blood transfusion 10/30/2014   hematochezia  . History of gout   . Hypertension   . OSA on CPAP    "suppose to wear mask; I've got a call in for an equipment change" (10/30/2014)  . PTSD (post-traumatic stress disorder)    "service related"  . Seizures ([place])   . Stroke [facility]) 2014   left extremity deficits; facial left  . Type II diabetes mellitus ([facility])     Family History  Problem Relation Age of Onset  . Diabetes Mother   . Breast cancer Mother   . Heart attack Mother        CABG - Age 40  . Kidney disease Mother   . Stroke Brother   . Lung disease Father   . Neurofibromatosis Maternal Uncle   . Throat cancer Brother   . Hypertension Brother     Past Surgical History:  Procedure Laterality Date  . BONE GRAFT HIP ILIAC CREST Left ~ 1976  . CATARACT EXTRACTION W/ INTRAOCULAR LENS  IMPLANT, BILATERAL Bilateral 2014-2015  . EP IMPLANTABLE DEVICE N/A 12/24/2015   Procedure: Loop Recorder Insertion;  Surgeon: Thompson Grayer, MD;  Location: [place] [facility];  Service: Cardiovascular;  Laterality: N/A;  . TEE WITHOUT CARDIOVERSION N/A 12/07/2017   Procedure: TRANSESOPHAGEAL ECHOCARDIOGRAM (TEE);  Surgeon: Fay Records, MD;  Location: [facility] ENDOSCOPY;  Service: Cardiovascular;  Laterality:  N/A;  . TUMOR REMOVAL Right ~ 1976   "arm; had to take bone left hip to add to the repair"   Social History   Occupational History  . Occupation: Retired  Tobacco Use  . Smoking status: Never Smoker  . Smokeless tobacco: Never Used  Substance and Sexual Activity  . Alcohol use: Yes    Alcohol/week: 0.6 oz    Types: 1 Cans of beer per week    Comment: 10/30/2014 "might have a beer a couple times/yr"  . Drug use: No  . Sexual activity: Yes

## 2018-02-04 ENCOUNTER — Encounter: Payer: Self-pay | Admitting: Neurology

## 2018-02-04 ENCOUNTER — Ambulatory Visit (INDEPENDENT_AMBULATORY_CARE_PROVIDER_SITE_OTHER): Payer: Medicare Other | Admitting: Neurology

## 2018-02-04 VITALS — BP 152/83 | HR 82 | Ht 76.0 in | Wt 173.0 lb

## 2018-02-04 DIAGNOSIS — F09 Unspecified mental disorder due to known physiological condition: Secondary | ICD-10-CM | POA: Diagnosis not present

## 2018-02-04 DIAGNOSIS — R269 Unspecified abnormalities of gait and mobility: Secondary | ICD-10-CM

## 2018-02-04 DIAGNOSIS — E1140 Type 2 diabetes mellitus with diabetic neuropathy, unspecified: Secondary | ICD-10-CM | POA: Insufficient documentation

## 2018-02-04 DIAGNOSIS — E1149 Type 2 diabetes mellitus with other diabetic neurological complication: Secondary | ICD-10-CM

## 2018-02-04 NOTE — Progress Notes (Signed)
PATIENT: Cody Silva DOB: 1946/01/03  Chief Complaint  Patient presents with  . Extremity Weakness    He is here with his wife, Freda Munro.  He is still having significant gait difficulty and weakness.  Also, bilateral hand tremors have continued to be problematic (right side worse than left).  PT did not improve his symptoms.     HISTORICAL  Cody Silva is a 72 years old right-handed male, accompanied by his wife, and son Jamar, seen in refer for hospital follow-up, Initial evaluation was on February 15 2017.  He had long-standing history of diabetes, become insulin-dependent, suffered diabetic retinopathy, blind in his left eye, hypertension, hyperlipidemia, retired Higher education careers adviser in [id number],  In June 2014 he presented with sudden onset right-sided weakness, slurred speech, gait abnormality, personally reviewed MRI of the brain without contrast on February 20 2013, positive DWIs lesion involving the body of corpus callosum, also the cingulate gyri, more on the right side,  He had long history of diabetes, insulin-dependent, hypertension, hyperlipidemia, retired as Higher education careers adviser at [age]. MRA of the neck showed no significant large vessel, MRA of the brain showed intracranial atherosclerosis.   He was treated with aspirin, had a gradual recovery, with mild residual right arm and leg weakness.  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  Hospital admission on September 12 2016, weight loss of 25 pounds in 3 months, orthostatic blood pressure changes, passing out episode, waking up in the middle of the night to make a sandwich, woke up on the floor of his kitchen  Loop recorder since April 2017,  showed no significant abnormality, EEG was  normal,  Echocardiogram on Jan 10 2017, ejection fraction 55-60%, wall motion was normal.  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, with scattered foci of old microhemorrhage compatible with chronic hypertension, but abnormal MRI findings does not explain his presentation,  He was found to have significant orthostatic blood pressure changes, symptoms improved with IV fluid, there was also multiple episode of staring spells, confusion, followed by post event confusion, with tentative diagnosis of seizure, he is now taking Depakote DR 500 mg 3 times a day,  EEG showed mild generalized slowing, Echocardiogram showed no significant abnormality Carotid Doppler study showed less than 39% stenosis,  I reviewed laboratory evaluations, in May 2018 elevated glucose 172, normal CPK 98, troponin was slightly elevated 0.0 3, negative UDS, Depakote level was 71, BNP was mildly elevated at 145, CMP showed creatinine 2.2, with estimated GFR 33, CBC showed hemoglobin of 12.1, A1c was elevated 6.7, vitamin B12 451, vitamin D was decreased 15.6, HIV was negative, normal TSH, UDS alcohol level was negative  UPDATE April 04 2017: Reviewed laboratory evaluation in June 2018, myasthenia gravis panel was negative, Lyme titer was negative, low vitamin D 17, normal and negative ANA, copper level, ferritin, hepatitis, protein electrophoresis, A1c was mildly elevated 6.2, normal CPK, negative HIV, RPR, CPK, C-reactive protein, folic acid,  EMG nerve conduction study on February 16 2017 showed evidence of length dependent severe axonal peripheral neuropathy, consistent with his long-standing history of diabetes, in  addition, there is evidence of chronic neuropathic changes involving right ulnar innervated muscles, mild active neuropathic changes involving bilateral lumbar sacral myotomes.  He continue complains significant gradual worsening gait abnormality, lower extremity weakness right worse than left, right hand muscle atrophy, weakness, he denies dysarthria, no dysphagia.  We have reviewed extensive imaging study in June 2018, MRI of the brain no acute abnormality, mild supratentorium small vessel disease. MRI of cervical spine, mild degenerative changes, most severe at C7-T1, severe right and moderate left foraminal stenosis. MRI thoracic spine was within normal limits. MRI lumbar spine showed mild degenerative disc diseasesignificant canal foraminal narrowing  The imaging findings would not explain his progressive course, muscle atrophy, weakness, brisk reflexes, electrodiagnostic study raised the possibility of motor neuron disease, I will refer him to [facility],  UPDATE Aug 06 2017: He was evaluated by Dr. Murlean Iba on Jul 14 2017, diabetic peripheral neuropathy, severe right ulnar neuropathy, history of right elbow surgery transposition years ago, multiple stroke in the past, there was noted superimposed proximal leg weakness, weight loss, leg atrophy, left hand atrophy, orthostatic dizziness, bowel and bladder complaints, the plan was to repeat EMG nerve conduction study,  Today wife also reported that he has gradual onset memory loss since October 2018, also developed visual hallucinations, increased bilateral hand tremor, recent onset of bilateral lower extremity tremor, ambulate with a cane,  His mother suffered dementia in her 59s   He has good sense of smell, he has difficulty sleep, mixed up day and night, he is not driving, he watches TV, "he is hateful", frustrated easily.   UPDATE February 04 2018: He was [facility] by [facility] neurologist on October 17, 2017, with  diagnosis of diabetic peripheral neuropathy, severe right ulnar neuropathy, bilateral lower extremity weakness,  He also has primary care through [facility], needing a chair lift for his steps at home,  ECHO on December 07 2017: Left ventricular hypertrophy, ejection fraction is normal,  He denies significant changes, he could hardly using his right hand, increased right hand tremor, more right side weakness, use walker,  He denies significant pain.  He needs help buttoning, urinary and bowel urgency.   REVIEW OF SYSTEMS: Full 14 system review of systems performed and notable only for as above   ALLERGIES: Allergies  Allergen Reactions  . Metformin And Related Diarrhea  . Milk-Related Compounds Diarrhea  . Tape Rash    HOME MEDICATIONS: Current Outpatient Medications  Medication Sig Dispense Refill  . acetaminophen (TYLENOL) 500 MG tablet Take 1 tablet (500 mg total) by mouth every 6 (six) hours as needed for mild pain, moderate pain, fever or headache. 30 tablet 0  . albuterol (PROVENTIL HFA;VENTOLIN HFA) 108 (90 Base) MCG/ACT inhaler Inhale 1 puff into the lungs every 6 (six) hours as needed for wheezing or shortness of breath.    [name] amLODipine (NORVASC) 5 MG tablet Take 1 tablet (5 mg total) by mouth daily. 30 tablet 0  . ammonium lactate (LAC-HYDRIN) 12 % lotion Apply 1 application topically as needed for dry skin.    [name] atorvastatin (LIPITOR) 20 MG tablet Take 2 tablets (40 mg total) by mouth daily at 6 PM. 30 tablet 0  . budesonide-formoterol (SYMBICORT) 80-4.5 MCG/ACT inhaler Inhale 2 puffs into the lungs 2 (two) times daily.    [name] buPROPion (WELLBUTRIN) 75 MG tablet Take 75 mg by mouth  daily.    . Carboxymethylcellulose Sod PF 0.25 % SOLN Place 1 drop into both eyes 4 (four) times daily.    . carvedilol (COREG) 25 MG tablet Take 25 mg by mouth 2 (two) times daily with a meal.    . cetirizine (ZYRTEC) 10 MG tablet Take 10 mg by mouth at bedtime as needed for allergies.    .  chlorproMAZINE (THORAZINE) 10 MG tablet Take 1 tablet (10 mg total) by mouth 3 (three) times daily as needed for hiccoughs. 30 tablet 0  . Cholecalciferol (VITAMIN D3) 2000 units TABS Take 2,000 Units by mouth daily.    . cloNIDine (CATAPRES - DOSED IN MG/24 HR) 0.3 mg/24hr patch Replace old patch with new patch every wednesday 4 patch 12  . clopidogrel (PLAVIX) 75 MG tablet Take 1 tablet (75 mg total) by mouth at bedtime. (Patient taking differently: Take 75 mg by mouth daily. )    . dorzolamide-timolol (COSOPT) 22.3-6.8 MG/ML ophthalmic solution Place 1 drop into both eyes as needed (as directed).     . feeding supplement, ENSURE ENLIVE, (ENSURE ENLIVE) LIQD Take 237 mLs by mouth 2 (two) times daily after a meal. 237 mL 12  . fluticasone (FLONASE) 50 MCG/ACT nasal spray Place 1 spray into both nostrils daily.    [name] gabapentin (NEURONTIN) 300 MG capsule Take 300 mg by mouth at bedtime.     . insulin aspart (NOVOLOG) 100 UNIT/ML injection Inject 0-9 Units into the skin 3 (three) times daily with meals. CBG 121-150=1 unit, CBG 151-200=2 units, CBG 201-250=3 units; CBG 251-300=5 units, CBG 301-350=7 units, CBG 351-400=9 units (Patient taking differently: Inject 0-9 Units into the skin See admin instructions. Inject 0-9 units into the skin three times a day with meals, per sliding scale: BGL: <70 = call MD; 121-150 = 1 unit; 151-200 = 2 units; 201-250 = 3 units; 251-300 = 5 units; 301-350 = 7 units; 351-400 = 9 units) 10 mL 0  . ketotifen (ZADITOR) 0.025 % ophthalmic solution Place 1 drop into both eyes daily as needed (for itching or irritation).     [name] latanoprost (XALATAN) 0.005 % ophthalmic solution Place 1 drop into both eyes at bedtime.     [name] losartan (COZAAR) 100 MG tablet Take 100 mg by mouth daily.    . Melatonin 3 MG TABS Take 3 mg by mouth at bedtime.    . montelukast (SINGULAIR) 10 MG tablet Take 10 mg by mouth at bedtime.    . sertraline (ZOLOFT) 100 MG tablet Take 100 mg by mouth daily.    .  silver sulfADIAZINE (SILVADENE) 1 % cream Apply topically daily. 50 g 0  . tamsulosin (FLOMAX) 0.4 MG CAPS capsule Take 0.8 mg by mouth at bedtime.    . terbinafine (LAMISIL) 1 % cream Apply 1 application topically 2 (two) times daily.    [name] tolnaftate (TINACTIN) 1 % powder Apply 1 application topically 2 (two) times daily.     No current facility-administered medications for this visit.     PAST MEDICAL HISTORY: Past Medical History:  Diagnosis Date  . Anemia   . Arthritis    "right arm, right ankle, right side" (10/30/2014)  . Colon polyps   . Diabetic neuropathy ([place])   . Diabetic retinopathy ([place])   . H/O agent Orange exposure   . Headache    "@ least 3 times/wk" (10/30/2014)  . History of blood transfusion 10/30/2014   hematochezia  . History of gout   . Hypertension   .  OSA on CPAP    "suppose to wear mask; I've got a call in for an equipment change" (10/30/2014)  . PTSD (post-traumatic stress disorder)    "service related"  . Seizures ([place])   . Stroke [facility]) 2014   left extremity deficits; facial left  . Type II diabetes mellitus ([place])     PAST SURGICAL HISTORY: Past Surgical History:  Procedure Laterality Date  . BONE GRAFT HIP ILIAC CREST Left ~ 1976  . CATARACT EXTRACTION W/ INTRAOCULAR LENS  IMPLANT, BILATERAL Bilateral 2014-2015  . EP IMPLANTABLE DEVICE N/A 12/24/2015   Procedure: Loop Recorder Insertion;  Surgeon: Thompson Grayer, MD;  Location: [place] [facility] LAB;  Service: Cardiovascular;  Laterality: N/A;  . TEE WITHOUT CARDIOVERSION N/A 12/07/2017   Procedure: TRANSESOPHAGEAL ECHOCARDIOGRAM (TEE);  Surgeon: Fay Records, MD;  Location: [facility] ENDOSCOPY;  Service: Cardiovascular;  Laterality: N/A;  . TUMOR REMOVAL Right ~ 1976   "arm; had to take bone left hip to add to the repair"    FAMILY HISTORY: Family History  Problem Relation Age of Onset  . Diabetes Mother   . Breast cancer Mother   . Heart attack Mother        CABG - Age 65  . Kidney disease Mother   .  Stroke Brother   . Lung disease Father   . Neurofibromatosis Maternal Uncle   . Throat cancer Brother   . Hypertension Brother     SOCIAL HISTORY:  Social History   Socioeconomic History  . Marital status: Married    Spouse name: sheila  . Number of children: 4  . Years of education: 47  . Highest education level: Not on file  Occupational History  . Occupation: Retired  Scientific laboratory technician  . Financial resource strain: Not on file  . Food insecurity:    Worry: Not on file    Inability: Not on file  . Transportation needs:    Medical: Not on file    Non-medical: Not on file  Tobacco Use  . Smoking status: Never Smoker  . Smokeless tobacco: Never Used  Substance and Sexual Activity  . Alcohol use: Yes    Alcohol/week: 0.6 oz    Types: 1 Cans of beer per week    Comment: 10/30/2014 "might have a beer a couple times/yr"  . Drug use: No  . Sexual activity: Yes  Lifestyle  . Physical activity:    Days per week: Not on file    Minutes per session: Not on file  . Stress: Not on file  Relationships  . Social connections:    Talks on phone: Not on file    Gets together: Not on file    Attends religious service: Not on file    Active member of club or organization: Not on file    Attends meetings of clubs or organizations: Not on file    Relationship status: Not on file  . Intimate partner violence:    Fear of current or ex partner: Not on file    Emotionally abused: Not on file    Physically abused: Not on file    Forced sexual activity: Not on file  Other Topics Concern  . Not on file  Social History Narrative   Patient is married with 4 children   Patient is right handed   Patient has a Water quality scientist degree    Patient 4 cups daily   Retired Engineer, structural. Lives with wife, daughter, cousin in [place].   Independent in ADLs  and partially dependent for IADLs.   Ambulates with cane sometimes due to intermittent right leg weakness.      PHYSICAL EXAM   Vitals:    02/04/18 1330  BP: (!) 152/83  Pulse: 82  Weight: 173 lb (78.5 kg)  Height: 6\' 4"  (1.93 m)    Not recorded      Body mass index is 21.06 kg/m.  PHYSICAL EXAMNIATION:  Gen: NAD, conversant, well nourised, obese, well groomed                     Cardiovascular: Regular rate rhythm, no peripheral edema, warm, nontender. Eyes: Conjunctivae clear without exudates or hemorrhage Neck: Supple, no carotid bruits. Pulmonary: Clear to auscultation bilaterally   NEUROLOGICAL EXAM: MMSE - Mini Mental State Exam 08/06/2017  Orientation to time 4  Orientation to Place 5  Registration 3  Attention/ Calculation 3  Recall 2  Language- name 2 objects 2  Language- repeat 1  Language- follow 3 step command 3  Language- read & follow direction 1  Write a sentence 1  Copy design 1  Total score 26     CRANIAL NERVES: CN II: Visual fields are full to confrontation.Pupils are round equal and briskly reactive to light. CN III, IV, VI: extraocular movement are normal. No ptosis. CN V: Facial sensation is intact to pinprick in all 3 divisions bilaterally. Corneal responses are intact.  CN VII: Face is symmetric with normal eye closure and smile. CN VIII: Hearing is normal to rubbing fingers CN IX, X: Palate elevates symmetrically. Phonation is normal. CN XI: Head turning and shoulder shrug are intact CN XII: Tongue is midline with normal movements and no atrophy.  MOTOR: He has significant bilateral intrinsic hand muscle atrophy, right worse than left, mild spasticity of bilateral upper and lower extremity muscles, shoulder abduction (R/L) 4/5, external rotation  4/4+, elbow flexion 5/5 elbow extension 5/5 wrist extension 4/4+ wrist flexion 5-/5, grip 4-/4, finger abduction 3/4, hip flexion 5-/5-, knee extension 5/5, knee flexion 5/5 ankle dorsi flexion 5/5, plantar flexion 5/5,     REFLEXES: Reflexes are 2  and symmetric at the biceps, triceps, knees, and absent at  ankles. Plantar responses  are extensor bilaterally   SENSORY length dependent decreased tolight touch, pinprick vibratory sensation to distal shin   COORDINATION: Rapid alternating movements and fine finger movements are intact. There is no dysmetria on finger-to-nose and heel-knee-shin.    GAIT/STANCE: Need to push up to get up from seated position, wide based, stiff, unsteady cautious gait    DIAGNOSTIC DATA (LABS, IMAGING, TESTING) - I reviewed patient records, labs, notes, testing and imaging myself where available.   ASSESSMENT AND PLAN  Cody Silva is a 72 y.o. male   Progressive painless muscle atrophy, weakness, gait abnormality  He has widespread chronic neuropathic changes at bilateral lumbar sacral, right cervical myotomes. His electrodiagnostic studies also complicated by long-standing history of diabetes, evidence of severe axonal length dependent peripheral neuropathy.   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  Ph.D.  [facility] [address] [place], [place] [address] Ph: [phone] Fax: [phone]  [id number], [facility]

## 2018-02-06 DIAGNOSIS — R413 Other amnesia: Secondary | ICD-10-CM | POA: Insufficient documentation

## 2018-02-06 DIAGNOSIS — R269 Unspecified abnormalities of gait and mobility: Secondary | ICD-10-CM | POA: Insufficient documentation

## 2018-02-07 ENCOUNTER — Ambulatory Visit (INDEPENDENT_AMBULATORY_CARE_PROVIDER_SITE_OTHER): Payer: Medicare Other | Admitting: Orthopedic Surgery

## 2018-02-07 ENCOUNTER — Encounter (INDEPENDENT_AMBULATORY_CARE_PROVIDER_SITE_OTHER): Payer: Self-pay | Admitting: Orthopedic Surgery

## 2018-02-07 DIAGNOSIS — M6701 Short Achilles tendon (acquired), right ankle: Secondary | ICD-10-CM | POA: Insufficient documentation

## 2018-02-07 DIAGNOSIS — L97511 Non-pressure chronic ulcer of other part of right foot limited to breakdown of skin: Secondary | ICD-10-CM | POA: Insufficient documentation

## 2018-02-07 DIAGNOSIS — M6702 Short Achilles tendon (acquired), left ankle: Secondary | ICD-10-CM

## 2018-02-07 NOTE — Progress Notes (Signed)
Office Visit Note   Patient: Cody Silva           Date of Birth: [birth year]           MRN: [id number] Visit Date: 02/07/2018              [facility]ed by: [facility] [address] [place], [place] [address] PCP: [facility]  Chief Complaint  Patient presents with  . Right Foot - Follow-up      HPI: Patient is a 72 year old gentleman who presents with diabetic insensate neuropathy Waggoner grade 1 ulcer beneath the fifth metatarsal head right foot.  Patient has extra-depth shoes custom orthotics.  Assessment & Plan: Visit Diagnoses:  1. Right foot ulcer, limited to breakdown of skin ([place])   2. Achilles tendon contracture, bilateral     Plan: Patient was given instructions for heel cord stretching to do 3 times a day a minute at a time this was demonstrated to him.  He may use a Band-Aid over the ulcer daily.  Follow-Up Instructions: Return in about 1 month (around 03/07/2018).   Ortho Exam  Patient is alert, oriented, no adenopathy, well-dressed, normal affect, normal respiratory effort. Examination patient has an antalgic gait he uses a cane.  Patient has a palpable pulse he has dorsiflexion short of neutral with his knee extended.  The ulcer shows good improvement from the last examination.  After informed consent a 10 blade knife was used to debride the skin and soft tissue back to healthy viable tissue this was touched with silver nitrate the ulcer is 10 mm in diameter 1 mm deep and a Band-Aid was applied.  His bone tendon there is no cellulitis no drainage no odor.  Imaging: No results found. No images are attached to the encounter.  Labs: Lab Results  Component Value Date   HGBA1C 5.5 12/04/2017   HGBA1C 6.2 (H) 02/15/2017   HGBA1C 6.7 (H) 01/10/2017   ESRSEDRATE 32 (H) 12/05/2017   ESRSEDRATE 37 (H) 09/10/2016   CRP 9.5 (H) 12/05/2017   CRP 0.4 02/15/2017   REPTSTATUS 12/08/2017 FINAL 12/03/2017   CULT  12/03/2017     NO GROWTH 5 DAYS Performed at [facility], [place] [address] [place], [place] [address]    LABORGA GROUP A STREP (S.PYOGENES) ISOLATED 11/11/2017     Lab Results  Component Value Date   ALBUMIN 2.7 (L) 12/03/2017   ALBUMIN 3.6 11/11/2017   ALBUMIN 3.2 (L) 01/18/2017    There is no height or weight on file to calculate BMI.  Orders:  No orders of the defined types were placed in this encounter.  No orders of the defined types were placed in this encounter.    Procedures: No procedures performed  Clinical Data: No additional findings.  ROS:  All other systems negative, except as noted in the HPI. Review of Systems  Objective: Vital Signs: There were no vitals taken for this visit.  Specialty Comments:  No specialty comments available.  PMFS History: Patient Active Problem List   Diagnosis Date Noted  . Achilles tendon contracture, bilateral 02/07/2018  . Right foot ulcer, limited to breakdown of skin ([place]) 02/07/2018  . Memory loss 02/06/2018  . Gait abnormality 02/06/2018  . Diabetic neuropathy ([place]) 02/04/2018  . Type II diabetes mellitus with foot ulcer ([place]) 12/05/2017  . Diabetic foot infection ([place])   . Fever 12/04/2017  . Fever and chills 12/04/2017  . Hx of bacteremia   . Wound eschar  of foot   . Acute sepsis ([place]) 11/12/2017  . Scrotal pain   . Sepsis ([place]) 11/11/2017  . Atrophy of muscle of right hand 04/04/2017  . Protein-calorie malnutrition, severe 01/19/2017  . Altered mental status 01/09/2017  . Facial droop 01/09/2017  . Cerebral thrombosis with cerebral infarction 01/09/2017  . Seizures ([place])   . Other hyperlipidemia   . OSA (obstructive sleep apnea) 09/12/2016  . Weight loss 09/12/2016  . Malnutrition of moderate degree 09/11/2016  . Syncope 09/10/2016  . H/O agent Orange exposure 12/24/2015  . Type 2 diabetes with nephropathy ([place])   . Near syncope 12/22/2015  . History of TIAs 09/14/2015  . CKD stage 3 due to type 2  diabetes mellitus ([place]) 09/14/2015  . Acute lower GI bleeding 10/30/2014  . History of colonic polyps 10/30/2014  . Depression 10/30/2014  . Symptomatic anemia 10/30/2014  . AKI (acute kidney injury) ([place]) 10/30/2014  . Ataxic gait 09/21/2014  . History of CVA (cerebrovascular accident) 02/22/2013  . Abnormal brain scan 02/21/2013  . Dizziness 02/21/2013  . Weakness 02/21/2013  . Diabetes mellitus ([place]) 02/21/2013  . Hypertension 02/21/2013   Past Medical History:  Diagnosis Date  . Anemia   . Arthritis    "right arm, right ankle, right side" (10/30/2014)  . Colon polyps   . Diabetic neuropathy ([place])   . Diabetic retinopathy ([place])   . H/O agent Orange exposure   . Headache    "@ least 3 times/wk" (10/30/2014)  . History of blood transfusion 10/30/2014   hematochezia  . History of gout   . Hypertension   . OSA on CPAP    "suppose to wear mask; I've got a call in for an equipment change" (10/30/2014)  . PTSD (post-traumatic stress disorder)    "service related"  . Seizures ([place])   . Stroke [facility]) 2014   left extremity deficits; facial left  . Type II diabetes mellitus (HCC)     Family History  Problem Relation Age of Onset  . Diabetes Mother   . Breast cancer Mother   . Heart attack Mother        CABG - Age 37  . Kidney disease Mother   . Stroke Brother   . Lung disease Father   . Neurofibromatosis Maternal Uncle   . Throat cancer Brother   . Hypertension Brother     Past Surgical History:  Procedure Laterality Date  . BONE GRAFT HIP ILIAC CREST Left ~ 1976  . CATARACT EXTRACTION W/ INTRAOCULAR LENS  IMPLANT, BILATERAL Bilateral 2014-2015  . EP IMPLANTABLE DEVICE N/A 12/24/2015   Procedure: Loop Recorder Insertion;  Surgeon: Thompson Grayer, MD;  Location: [facility];  Service: Cardiovascular;  Laterality: N/A;  . TEE WITHOUT CARDIOVERSION N/A 12/07/2017   Procedure: TRANSESOPHAGEAL ECHOCARDIOGRAM (TEE);  Surgeon: Fay Records, MD;  Location: [facility] ENDOSCOPY;   Service: Cardiovascular;  Laterality: N/A;  . TUMOR REMOVAL Right ~ 1976   "arm; had to take bone left hip to add to the repair"   Social History   Occupational History  . Occupation: Retired  Tobacco Use  . Smoking status: Never Smoker  . Smokeless tobacco: Never Used  Substance and Sexual Activity  . Alcohol use: Yes    Alcohol/week: 0.6 oz    Types: 1 Cans of beer per week    Comment: 10/30/2014 "might have a beer a couple times/yr"  . Drug use: No  . Sexual activity: Yes

## 2018-03-12 ENCOUNTER — Ambulatory Visit (INDEPENDENT_AMBULATORY_CARE_PROVIDER_SITE_OTHER): Payer: Medicare Other | Admitting: Orthopedic Surgery

## 2018-03-12 ENCOUNTER — Encounter (INDEPENDENT_AMBULATORY_CARE_PROVIDER_SITE_OTHER): Payer: Self-pay | Admitting: Orthopedic Surgery

## 2018-03-12 VITALS — Ht 76.0 in | Wt 173.0 lb

## 2018-03-12 DIAGNOSIS — M6702 Short Achilles tendon (acquired), left ankle: Secondary | ICD-10-CM | POA: Diagnosis not present

## 2018-03-12 DIAGNOSIS — M6701 Short Achilles tendon (acquired), right ankle: Secondary | ICD-10-CM

## 2018-03-12 DIAGNOSIS — I87323 Chronic venous hypertension (idiopathic) with inflammation of bilateral lower extremity: Secondary | ICD-10-CM | POA: Diagnosis not present

## 2018-03-12 DIAGNOSIS — L97511 Non-pressure chronic ulcer of other part of right foot limited to breakdown of skin: Secondary | ICD-10-CM

## 2018-03-12 DIAGNOSIS — B351 Tinea unguium: Secondary | ICD-10-CM

## 2018-03-12 NOTE — Progress Notes (Signed)
Office Visit Note   Patient: Cody Silva           Date of Birth: July 01, 1946           MRN: [id number] Visit Date: 03/12/2018              [facility]ed by: [facility] [address] [place], [place] [address] PCP: [facility]  Chief Complaint  Patient presents with  . Right Foot - Follow-up    Bilateral achilles contracture and right foot ulcer  . Left Foot - Follow-up      HPI: Patient is a 72 year old gentleman who presents with a Wagener grade 1 ulcer beneath the fifth metatarsal head right foot complains of onychomycotic nails x10 that he is unable to safely trim on his own due to his diabetic insensate neuropathy complains of pitting edema and swelling of both lower extremities.  Patient states that he sleeps with his feet hanging off the bed.  Assessment & Plan: Visit Diagnoses:  1. Achilles tendon contracture, bilateral   2. Idiopathic chronic venous hypertension of both lower extremities with inflammation   3. Onychomycosis   4. Right foot ulcer, limited to breakdown of skin ([place])     Plan: Recommended knee-high compression stockings to be worn at night to decrease swelling while he is sleeping.  He can wear them during the day as well.  Patient was given instruction and demonstrated Achilles stretching to unload pressure over the forefoot.  Nails were trimmed ulcer was debrided.  Follow-Up Instructions: Return in about 3 months (around 06/12/2018).   Ortho Exam  Patient is alert, oriented, no adenopathy, well-dressed, normal affect, normal respiratory effort. Examination patient has pitting edema in both lower extremities with venous insufficiency.  There are no venous ulcers there is no cellulitis.  He does have Achilles contracture with dorsiflexion only to neutral.  He has a Waggoner grade 1 ulcer beneath the fifth metatarsal head right foot.  After informed consent a 10 blade knife was used to debride the skin and soft  tissue back to healthy viable tissue the ulcer is 10 mm in diameter 0.1 mm deep.  Patient has thickened discolored onychomycotic nails x10 he cannot safely trim these on his own and the nails were trimmed E08 without complications no paronychial infection.  Imaging: No results found. No images are attached to the encounter.  Labs: Lab Results  Component Value Date   HGBA1C 5.5 12/04/2017   HGBA1C 6.2 (H) 02/15/2017   HGBA1C 6.7 (H) 01/10/2017   ESRSEDRATE 32 (H) 12/05/2017   ESRSEDRATE 37 (H) 09/10/2016   CRP 9.5 (H) 12/05/2017   CRP 0.4 02/15/2017   REPTSTATUS 12/08/2017 FINAL 12/03/2017   CULT  12/03/2017    NO GROWTH 5 DAYS Performed at [facility], [place] [address], [place], [place] [address]    LABORGA GROUP A STREP (S.PYOGENES) ISOLATED 11/11/2017     Lab Results  Component Value Date   ALBUMIN 2.7 (L) 12/03/2017   ALBUMIN 3.6 11/11/2017   ALBUMIN 3.2 (L) 01/18/2017    Body mass index is 21.06 kg/m.  Orders:  No orders of the defined types were placed in this encounter.  No orders of the defined types were placed in this encounter.    Procedures: No procedures performed  Clinical Data: No additional findings.  ROS:  All other systems negative, except as noted in the HPI. Review of Systems  Objective: Vital Signs: Ht 6\' 4"  (1.93 m)   Wt  173 lb (78.5 kg)   BMI 21.06 kg/m   Specialty Comments:  No specialty comments available.  PMFS History: Patient Active Problem List   Diagnosis Date Noted  . Achilles tendon contracture, bilateral 02/07/2018  . Right foot ulcer, limited to breakdown of skin ([place]) 02/07/2018  . Memory loss 02/06/2018  . Gait abnormality 02/06/2018  . Diabetic neuropathy ([place]) 02/04/2018  . Type II diabetes mellitus with foot ulcer ([place]) 12/05/2017  . Diabetic foot infection ([place])   . Fever 12/04/2017  . Fever and chills 12/04/2017  . Hx of bacteremia   . Wound eschar of foot   . Acute sepsis ([place]) 11/12/2017  .  Scrotal pain   . Sepsis ([place]) 11/11/2017  . Atrophy of muscle of right hand 04/04/2017  . Protein-calorie malnutrition, severe 01/19/2017  . Altered mental status 01/09/2017  . Facial droop 01/09/2017  . Cerebral thrombosis with cerebral infarction 01/09/2017  . Seizures ([place])   . Other hyperlipidemia   . OSA (obstructive sleep apnea) 09/12/2016  . Weight loss 09/12/2016  . Malnutrition of moderate degree 09/11/2016  . Syncope 09/10/2016  . H/O agent Orange exposure 12/24/2015  . Type 2 diabetes with nephropathy ([place])   . Near syncope 12/22/2015  . History of TIAs 09/14/2015  . CKD stage 3 due to type 2 diabetes mellitus ([place]) 09/14/2015  . Acute lower GI bleeding 10/30/2014  . History of colonic polyps 10/30/2014  . Depression 10/30/2014  . Symptomatic anemia 10/30/2014  . AKI (acute kidney injury) ([place]) 10/30/2014  . Ataxic gait 09/21/2014  . History of CVA (cerebrovascular accident) 02/22/2013  . Abnormal brain scan 02/21/2013  . Dizziness 02/21/2013  . Weakness 02/21/2013  . Diabetes mellitus ([place]) 02/21/2013  . Hypertension 02/21/2013   Past Medical History:  Diagnosis Date  . Anemia   . Arthritis    "right arm, right ankle, right side" (10/30/2014)  . Colon polyps   . Diabetic neuropathy ([place])   . Diabetic retinopathy ([place])   . H/O agent Orange exposure   . Headache    "@ least 3 times/wk" (10/30/2014)  . History of blood transfusion 10/30/2014   hematochezia  . History of gout   . Hypertension   . OSA on CPAP    "suppose to wear mask; I've got a call in for an equipment change" (10/30/2014)  . PTSD (post-traumatic stress disorder)    "service related"  . Seizures ([place])   . Stroke [facility]) 2014   left extremity deficits; facial left  . Type II diabetes mellitus (HCC)     Family History  Problem Relation Age of Onset  . Diabetes Mother   . Breast cancer Mother   . Heart attack Mother        CABG - Age 75  . Kidney disease Mother   . Stroke Brother   . Lung  disease Father   . Neurofibromatosis Maternal Uncle   . Throat cancer Brother   . Hypertension Brother     Past Surgical History:  Procedure Laterality Date  . BONE GRAFT HIP ILIAC CREST Left ~ 1976  . CATARACT EXTRACTION W/ INTRAOCULAR LENS  IMPLANT, BILATERAL Bilateral 2014-2015  . EP IMPLANTABLE DEVICE N/A 12/24/2015   Procedure: Loop Recorder Insertion;  Surgeon: Thompson Grayer, MD;  Location: [facility];  Service: Cardiovascular;  Laterality: N/A;  . TEE WITHOUT CARDIOVERSION N/A 12/07/2017   Procedure: TRANSESOPHAGEAL ECHOCARDIOGRAM (TEE);  Surgeon: Fay Records, MD;  Location: [facility];  Service: Cardiovascular;  Laterality: N/A;  .  TUMOR REMOVAL Right ~ 1976   "arm; had to take bone left hip to add to the repair"   Social History   Occupational History  . Occupation: Retired  Tobacco Use  . Smoking status: Never Smoker  . Smokeless tobacco: Never Used  Substance and Sexual Activity  . Alcohol use: Yes    Alcohol/week: 0.6 oz    Types: 1 Cans of beer per week    Comment: 10/30/2014 "might have a beer a couple times/yr"  . Drug use: No  . Sexual activity: Yes

## 2018-05-10 IMAGING — US US SCROTUM W/ DOPPLER COMPLETE
1 series · 13 of 25 positions shown · non-contrast
Comparison: None.

CLINICAL DATA: Bilateral testicular tenderness.

EXAM:
SCROTAL ULTRASOUND
DOPPLER ULTRASOUND OF THE TESTICLES
TECHNIQUE: Complete ultrasound examination of the testicles, epididymis, and
other scrotal structures was performed. Color and spectral Doppler
ultrasound were also utilized to evaluate blood flow to the
testicles.

[Series 1: us scrotum w/ doppler complete · 0.06mm/px · 13 of 93 slices shown]
[im 1/93]
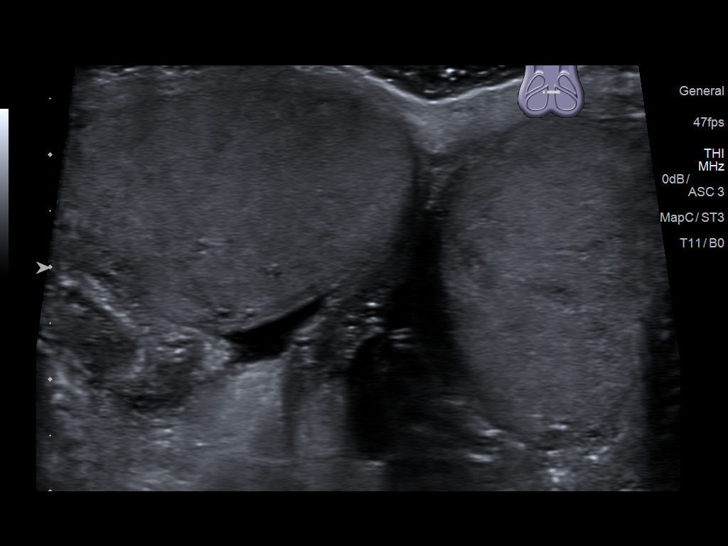
[im 8/93]
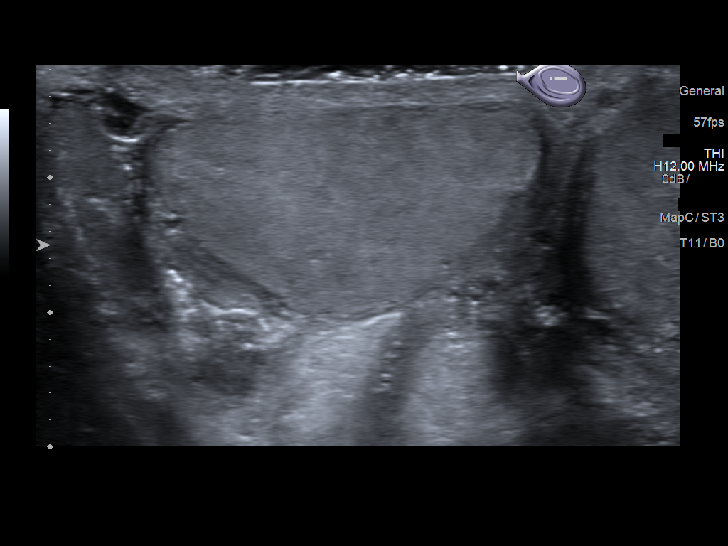
[im 16/93]
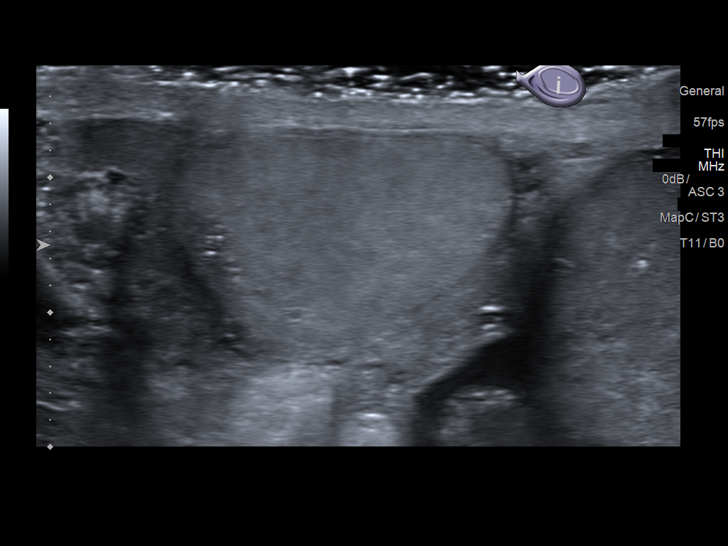
[im 24/93]
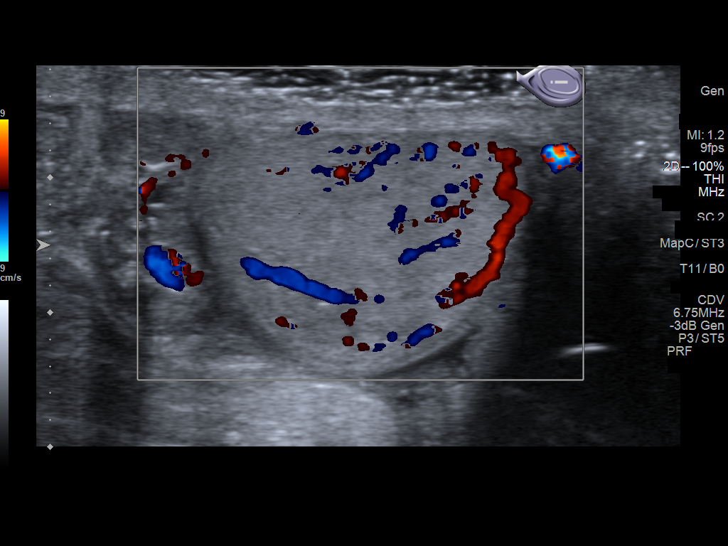
[im 31/93]
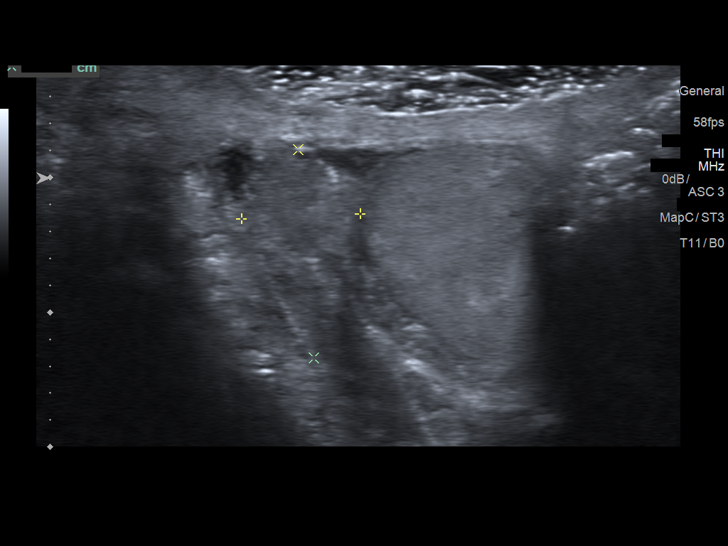
[im 39/93]
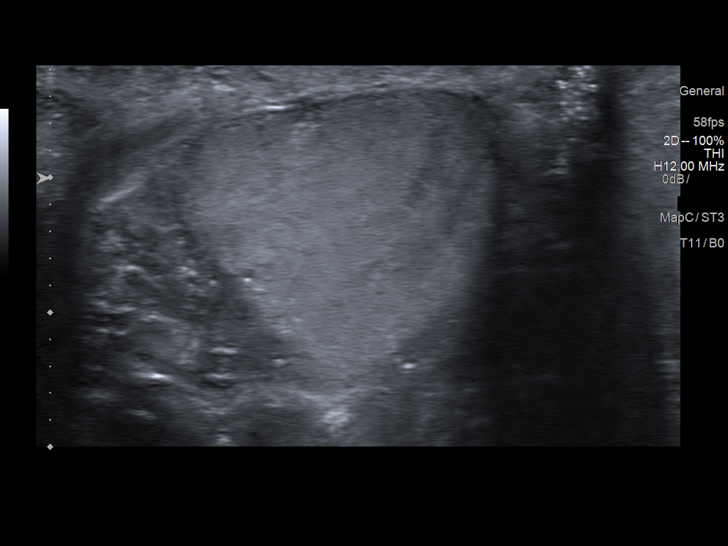
[im 47/93]
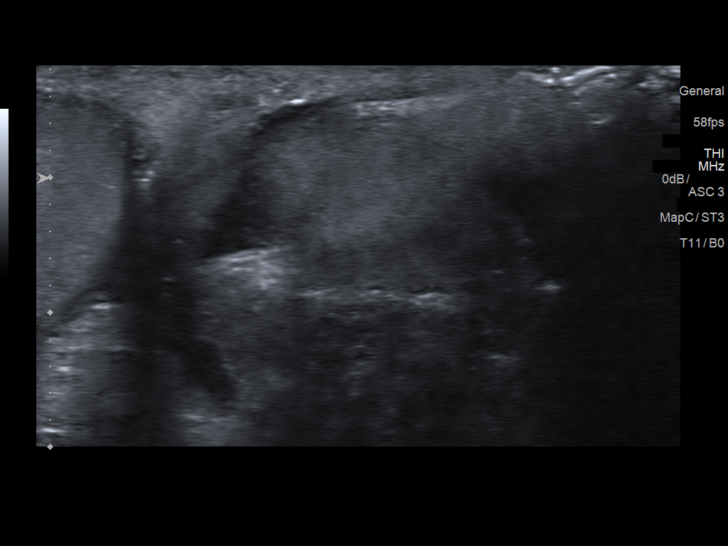
[im 54/93]
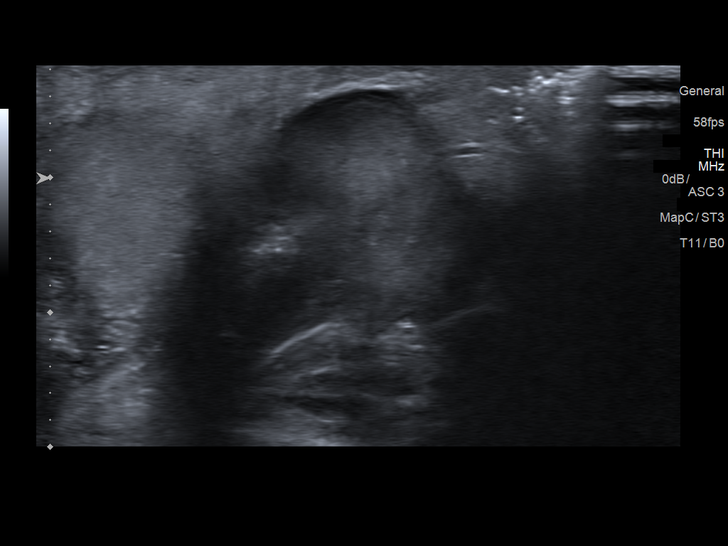
[im 62/93]
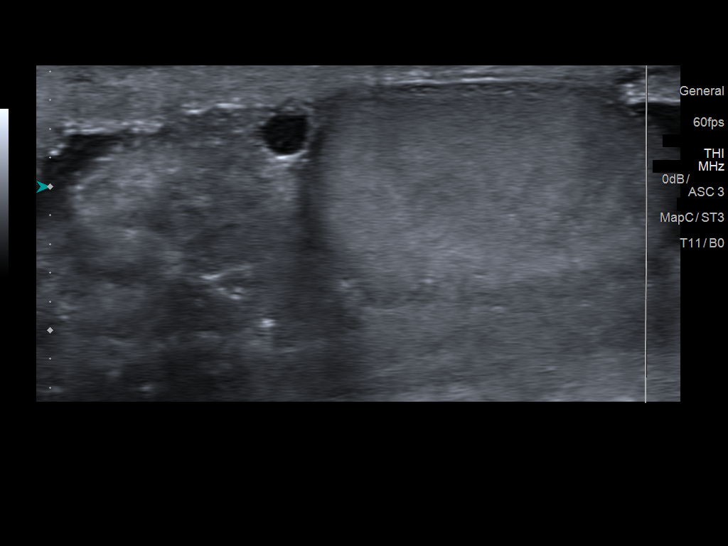
[im 70/93]
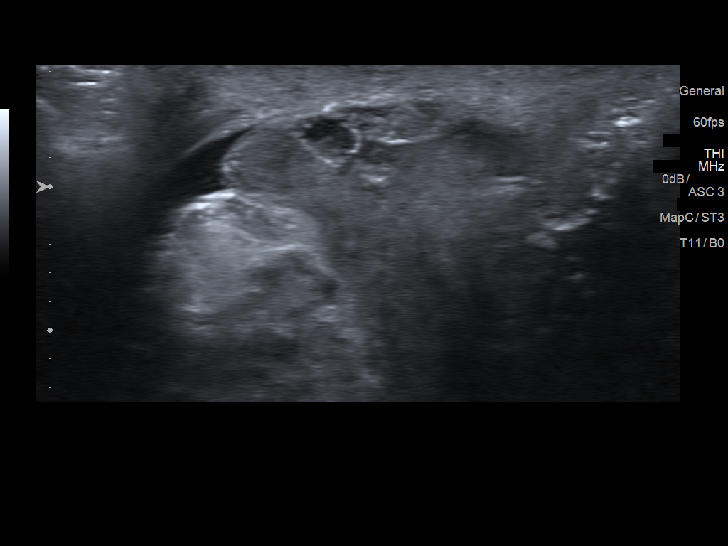
[im 77/93]
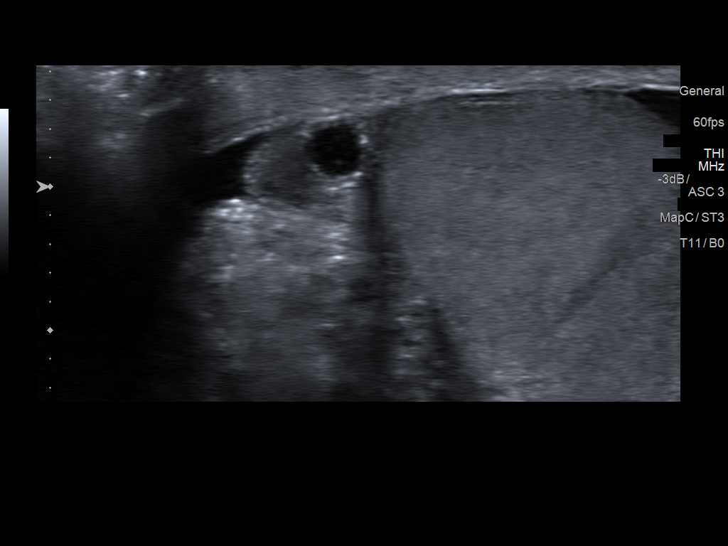
[im 85/93]
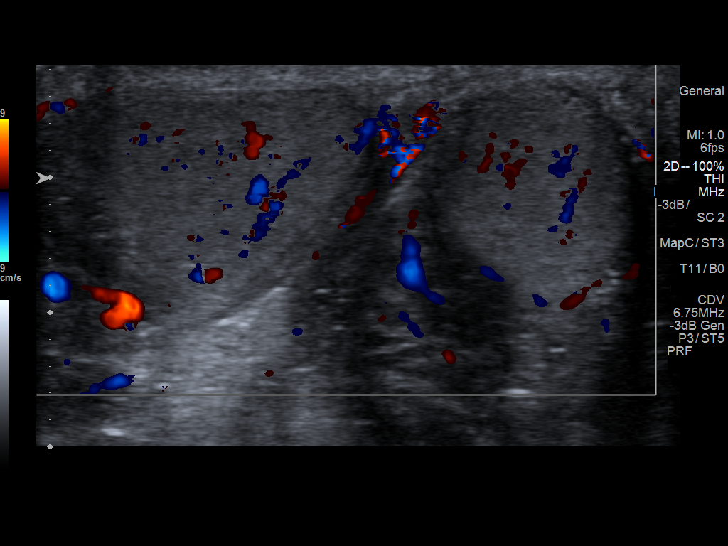
[im 93/93]
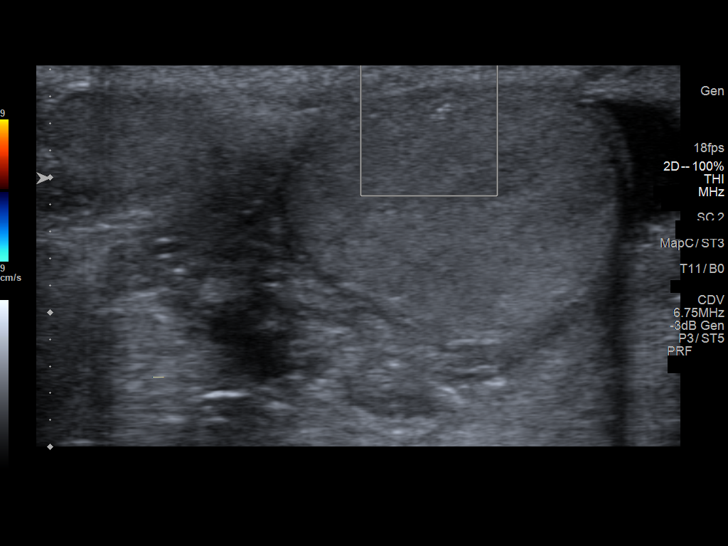

[13 of 25 positions shown; findings below may reference images not displayed]

FINDINGS: Right testicle

Measurements: 2.9 x 1.7 x 2.4 cm. No mass or microlithiasis
visualized.

Left testicle

Measurements: 3.2 x 2.1 x 2.3 cm. Two tiny clusters of
microcalcifications are identified. 0.2 cm cyst in the periphery of
the left testicle is noted.

Right epididymis:  Normal in size and appearance.

Left epididymis:  0.4 cm cyst is noted.

Hydrocele:  Small bilateral.

Varicocele:  None visualized.

Pulsed Doppler interrogation of both testes demonstrates normal low
resistance arterial and venous waveforms bilaterally.
IMPRESSION: No acute abnormality or finding to explain the patient's symptoms.

2 small clusters of microcalcifications in the left testicle
compatible with microlithiasis. Current literature suggests that
testicular microlithiasis is not a significant independent risk
factor for development of testicular carcinoma, and that follow up
imaging is not warranted in the absence of other risk factors.
Monthly testicular self-examination and annual physical exams are
considered appropriate surveillance. If patient has other risk
factors for testicular carcinoma, then referral to Urology should be
considered. (Reference: Mfury, et al.: A 5-Year Follow up Study
of Asymptomatic Men with Testicular Microlithiasis. J Urol 9550;
[id number].)

Small cyst or spermatocele in the left epididymis.

## 2018-06-12 ENCOUNTER — Ambulatory Visit (INDEPENDENT_AMBULATORY_CARE_PROVIDER_SITE_OTHER): Payer: Medicare Other | Admitting: Orthopedic Surgery

## 2018-06-12 ENCOUNTER — Encounter (INDEPENDENT_AMBULATORY_CARE_PROVIDER_SITE_OTHER): Payer: Self-pay | Admitting: Orthopedic Surgery

## 2018-06-12 VITALS — Ht 76.0 in | Wt 173.0 lb

## 2018-06-12 DIAGNOSIS — L97511 Non-pressure chronic ulcer of other part of right foot limited to breakdown of skin: Secondary | ICD-10-CM

## 2018-06-12 DIAGNOSIS — M6702 Short Achilles tendon (acquired), left ankle: Secondary | ICD-10-CM

## 2018-06-12 DIAGNOSIS — I87323 Chronic venous hypertension (idiopathic) with inflammation of bilateral lower extremity: Secondary | ICD-10-CM

## 2018-06-12 DIAGNOSIS — M6701 Short Achilles tendon (acquired), right ankle: Secondary | ICD-10-CM | POA: Diagnosis not present

## 2018-06-12 NOTE — Progress Notes (Signed)
Office Visit Note   Patient: Cody Silva           Date of Birth: 08-14-1946           MRN: [id number] Visit Date: 06/12/2018              [facility]ed by: [facility] [address] [place], [place] [address] PCP: [facility]  Chief Complaint  Patient presents with  . Right Foot - Follow-up, Pain      HPI: Patient is a 72 year old gentleman with diabetic insensate neuropathy venous insufficiency heel cord contracture with a Wagener grade 1 ulcer beneath the fifth metatarsal head right foot.  Patient complains of pain with weightbearing states the pain is a 5/10.  He is wearing extra-depth shoes and custom orthotics.  Assessment & Plan: Visit Diagnoses:  1. Achilles tendon contracture, bilateral   2. Idiopathic chronic venous hypertension of both lower extremities with inflammation   3. Right foot ulcer, limited to breakdown of skin ([place])     Plan: Ulcer was debrided of skin and soft tissue.  Patient was given instructions and demonstrated Achilles heel cord stretching.  Reevaluate the wound at follow-up he will start Band-Aid dressing changes with antibiotic ointment.  Follow-Up Instructions: Return in about 3 weeks (around 07/03/2018).   Ortho Exam  Patient is alert, oriented, no adenopathy, well-dressed, normal affect, normal respiratory effort. Examination patient has venous swelling in both legs but no venous ulcers he has a good dorsalis pedis pulse.  He has heel cord contracture with dorsiflexion just short of neutral with his knee extended.  Patient has a Waggoner grade 1 ulcer beneath the fifth metatarsal head he has a cavovarus foot which causes more pressure over the lateral column.  After informed consent a 10 blade knife was used to debride the skin and soft tissue back to healthy viable granulation tissue.  The ulcer is 2 cm in diameter and 2 mm deep.  Silver nitrate was used for hemostasis Iodosorb and a Band-Aid was  applied.  Imaging: No results found. No images are attached to the encounter.  Labs: Lab Results  Component Value Date   HGBA1C 5.5 12/04/2017   HGBA1C 6.2 (H) 02/15/2017   HGBA1C 6.7 (H) 01/10/2017   ESRSEDRATE 32 (H) 12/05/2017   ESRSEDRATE 37 (H) 09/10/2016   CRP 9.5 (H) 12/05/2017   CRP 0.4 02/15/2017   REPTSTATUS 12/08/2017 FINAL 12/03/2017   CULT  12/03/2017    NO GROWTH 5 DAYS Performed at [facility], [place] [address], [place], [place] [address]    LABORGA GROUP A STREP (S.PYOGENES) ISOLATED 11/11/2017     Lab Results  Component Value Date   ALBUMIN 2.7 (L) 12/03/2017   ALBUMIN 3.6 11/11/2017   ALBUMIN 3.2 (L) 01/18/2017    Body mass index is 21.06 kg/m.  Orders:  No orders of the defined types were placed in this encounter.  No orders of the defined types were placed in this encounter.    Procedures: No procedures performed  Clinical Data: No additional findings.  ROS:  All other systems negative, except as noted in the HPI. Review of Systems  Objective: Vital Signs: Ht 6\' 4"  (1.93 m)   Wt 173 lb (78.5 kg)   BMI 21.06 kg/m   Specialty Comments:  No specialty comments available.  PMFS History: Patient Active Problem List   Diagnosis Date Noted  . Achilles tendon contracture, bilateral 02/07/2018  . Right foot ulcer, limited to breakdown of  skin ([place]) 02/07/2018  . Memory loss 02/06/2018  . Gait abnormality 02/06/2018  . Diabetic neuropathy ([place]) 02/04/2018  . Type II diabetes mellitus with foot ulcer ([place]) 12/05/2017  . Diabetic foot infection ([place])   . Fever 12/04/2017  . Fever and chills 12/04/2017  . Hx of bacteremia   . Wound eschar of foot   . Acute sepsis ([place]) 11/12/2017  . Scrotal pain   . Sepsis ([place]) 11/11/2017  . Atrophy of muscle of right hand 04/04/2017  . Protein-calorie malnutrition, severe 01/19/2017  . Altered mental status 01/09/2017  . Facial droop 01/09/2017  . Cerebral thrombosis with cerebral  infarction 01/09/2017  . Seizures ([place])   . Other hyperlipidemia   . OSA (obstructive sleep apnea) 09/12/2016  . Weight loss 09/12/2016  . Malnutrition of moderate degree 09/11/2016  . Syncope 09/10/2016  . H/O agent Orange exposure 12/24/2015  . Type 2 diabetes with nephropathy ([place])   . Near syncope 12/22/2015  . History of TIAs 09/14/2015  . CKD stage 3 due to type 2 diabetes mellitus ([place]) 09/14/2015  . Acute lower GI bleeding 10/30/2014  . History of colonic polyps 10/30/2014  . Depression 10/30/2014  . Symptomatic anemia 10/30/2014  . AKI (acute kidney injury) ([place]) 10/30/2014  . Ataxic gait 09/21/2014  . History of CVA (cerebrovascular accident) 02/22/2013  . Abnormal brain scan 02/21/2013  . Dizziness 02/21/2013  . Weakness 02/21/2013  . Diabetes mellitus ([place]) 02/21/2013  . Hypertension 02/21/2013   Past Medical History:  Diagnosis Date  . Anemia   . Arthritis    "right arm, right ankle, right side" (10/30/2014)  . Colon polyps   . Diabetic neuropathy ([place])   . Diabetic retinopathy ([place])   . H/O agent Orange exposure   . Headache    "@ least 3 times/wk" (10/30/2014)  . History of blood transfusion 10/30/2014   hematochezia  . History of gout   . Hypertension   . OSA on CPAP    "suppose to wear mask; I've got a call in for an equipment change" (10/30/2014)  . PTSD (post-traumatic stress disorder)    "service related"  . Seizures ([place])   . Stroke [facility]) 2014   left extremity deficits; facial left  . Type II diabetes mellitus (HCC)     Family History  Problem Relation Age of Onset  . Diabetes Mother   . Breast cancer Mother   . Heart attack Mother        CABG - Age 71  . Kidney disease Mother   . Stroke Brother   . Lung disease Father   . Neurofibromatosis Maternal Uncle   . Throat cancer Brother   . Hypertension Brother     Past Surgical History:  Procedure Laterality Date  . BONE GRAFT HIP ILIAC CREST Left ~ 1976  . CATARACT EXTRACTION W/  INTRAOCULAR LENS  IMPLANT, BILATERAL Bilateral 2014-2015  . EP IMPLANTABLE DEVICE N/A 12/24/2015   Procedure: Loop Recorder Insertion;  Surgeon: Thompson Grayer, MD;  Location: [facility];  Service: Cardiovascular;  Laterality: N/A;  . TEE WITHOUT CARDIOVERSION N/A 12/07/2017   Procedure: TRANSESOPHAGEAL ECHOCARDIOGRAM (TEE);  Surgeon: Fay Records, MD;  Location: [facility] ENDOSCOPY;  Service: Cardiovascular;  Laterality: N/A;  . TUMOR REMOVAL Right ~ 1976   "arm; had to take bone left hip to add to the repair"   Social History   Occupational History  . Occupation: Retired  Tobacco Use  . Smoking status: Never Smoker  . Smokeless tobacco:  Never Used  Substance and Sexual Activity  . Alcohol use: Yes    Alcohol/week: 1.0 standard drinks    Types: 1 Cans of beer per week    Comment: 10/30/2014 "might have a beer a couple times/yr"  . Drug use: No  . Sexual activity: Yes

## 2018-07-03 ENCOUNTER — Ambulatory Visit (INDEPENDENT_AMBULATORY_CARE_PROVIDER_SITE_OTHER): Payer: Medicare Other | Admitting: Orthopedic Surgery

## 2018-07-03 ENCOUNTER — Encounter (INDEPENDENT_AMBULATORY_CARE_PROVIDER_SITE_OTHER): Payer: Self-pay | Admitting: Orthopedic Surgery

## 2018-07-03 VITALS — Ht 76.0 in | Wt 173.0 lb

## 2018-07-03 DIAGNOSIS — L97511 Non-pressure chronic ulcer of other part of right foot limited to breakdown of skin: Secondary | ICD-10-CM | POA: Diagnosis not present

## 2018-07-03 DIAGNOSIS — M6700 Short Achilles tendon (acquired), unspecified ankle: Secondary | ICD-10-CM | POA: Insufficient documentation

## 2018-07-03 DIAGNOSIS — M6701 Short Achilles tendon (acquired), right ankle: Secondary | ICD-10-CM

## 2018-07-03 DIAGNOSIS — E11621 Type 2 diabetes mellitus with foot ulcer: Secondary | ICD-10-CM

## 2018-07-03 DIAGNOSIS — L97509 Non-pressure chronic ulcer of other part of unspecified foot with unspecified severity: Secondary | ICD-10-CM

## 2018-07-03 NOTE — Progress Notes (Signed)
Office Visit Note   Patient: Cody Silva           Date of Birth: [birth year]           MRN: [id number] Visit Date: 07/03/2018              [facility]ed by: [facility] [address] [place], [place] [address] PCP: [facility]  Chief Complaint  Patient presents with  . Right Foot - Pain, Follow-up    Small ulcer posterior right foot      HPI: Patient is a 72 year old gentleman with diabetic insensate neuropathy venous insufficiency heel cord contracture with a Wagener grade 1 ulcer beneath the fifth metatarsal head right foot.  Patient complains of pain with weightbearing.  He is wearing extra-depth shoes and custom orthotics.  Assessment & Plan: Visit Diagnoses:  No diagnosis found.  Plan: Ulcer was debrided of skin and soft tissue. Voiced immediate relief.  Patient was given instructions and demonstrated Achilles heel cord stretching.  Reevaluate the area at follow-up. Placed a donut in shoe wear for pressure relief.  Follow-Up Instructions: No follow-ups on file.   Ortho Exam  Patient is alert, oriented, no adenopathy, well-dressed, normal affect, normal respiratory effort. Examination patient has venous swelling in both legs but no venous ulcers he has a good dorsalis pedis pulse.  He has heel cord contracture with dorsiflexion just short of neutral with his knee extended.  Patient has a Wagner grade 1 ulcer beneath the fifth metatarsal head he has a cavovarus foot which causes more pressure over the lateral column.  After informed consent a 10 blade knife was used to debride the skin and soft tissue back to healthy viable tissue.  The ulcer is 2 cm in diameter. With no depth. No drainage. No open area. Corn debrided as well.   Imaging: No results found. No images are attached to the encounter.  Labs: Lab Results  Component Value Date   HGBA1C 5.5 12/04/2017   HGBA1C 6.2 (H) 02/15/2017   HGBA1C 6.7 (H) 01/10/2017   ESRSEDRATE 32 (H) 12/05/2017   ESRSEDRATE 37 (H) 09/10/2016   CRP 9.5 (H) 12/05/2017   CRP 0.4 02/15/2017   REPTSTATUS 12/08/2017 FINAL 12/03/2017   CULT  12/03/2017    NO GROWTH 5 DAYS Performed at [facility], [place] [address], [place], [place] [address]    LABORGA GROUP A STREP (S.PYOGENES) ISOLATED 11/11/2017     Lab Results  Component Value Date   ALBUMIN 2.7 (L) 12/03/2017   ALBUMIN 3.6 11/11/2017   ALBUMIN 3.2 (L) 01/18/2017    Body mass index is 21.06 kg/m.  Orders:  No orders of the defined types were placed in this encounter.  No orders of the defined types were placed in this encounter.    Procedures: No procedures performed  Clinical Data: No additional findings.  ROS:  All other systems negative, except as noted in the HPI. Review of Systems  Constitutional: Negative for chills and fever.  Skin: Negative for color change and wound.    Objective: Vital Signs: Ht 6\' 4"  (1.93 m)   Wt 173 lb (78.5 kg)   BMI 21.06 kg/m   Specialty Comments:  No specialty comments available.  PMFS History: Patient Active Problem List   Diagnosis Date Noted  . Achilles tendon contracture, bilateral 02/07/2018  . Right foot ulcer, limited to breakdown of skin ([place]) 02/07/2018  . Memory loss 02/06/2018  . Gait abnormality 02/06/2018  . Diabetic neuropathy (  [place]) 02/04/2018  . Type II diabetes mellitus with foot ulcer ([place]) 12/05/2017  . Diabetic foot infection ([place])   . Fever 12/04/2017  . Fever and chills 12/04/2017  . Hx of bacteremia   . Wound eschar of foot   . Acute sepsis ([place]) 11/12/2017  . Scrotal pain   . Sepsis ([place]) 11/11/2017  . Atrophy of muscle of right hand 04/04/2017  . Protein-calorie malnutrition, severe 01/19/2017  . Altered mental status 01/09/2017  . Facial droop 01/09/2017  . Cerebral thrombosis with cerebral infarction 01/09/2017  . Seizures ([place])   . Other hyperlipidemia   . OSA (obstructive sleep apnea) 09/12/2016  . Weight  loss 09/12/2016  . Malnutrition of moderate degree 09/11/2016  . Syncope 09/10/2016  . H/O agent Orange exposure 12/24/2015  . Type 2 diabetes with nephropathy ([place])   . Near syncope 12/22/2015  . History of TIAs 09/14/2015  . CKD stage 3 due to type 2 diabetes mellitus ([place]) 09/14/2015  . Acute lower GI bleeding 10/30/2014  . History of colonic polyps 10/30/2014  . Depression 10/30/2014  . Symptomatic anemia 10/30/2014  . AKI (acute kidney injury) ([place]) 10/30/2014  . Ataxic gait 09/21/2014  . History of CVA (cerebrovascular accident) 02/22/2013  . Abnormal brain scan 02/21/2013  . Dizziness 02/21/2013  . Weakness 02/21/2013  . Diabetes mellitus ([place]) 02/21/2013  . Hypertension 02/21/2013   Past Medical History:  Diagnosis Date  . Anemia   . Arthritis    "right arm, right ankle, right side" (10/30/2014)  . Colon polyps   . Diabetic neuropathy ([place])   . Diabetic retinopathy ([place])   . H/O agent Orange exposure   . Headache    "@ least 3 times/wk" (10/30/2014)  . History of blood transfusion 10/30/2014   hematochezia  . History of gout   . Hypertension   . OSA on CPAP    "suppose to wear mask; I've got a call in for an equipment change" (10/30/2014)  . PTSD (post-traumatic stress disorder)    "service related"  . Seizures ([place])   . Stroke [facility]) 2014   left extremity deficits; facial left  . Type II diabetes mellitus (HCC)     Family History  Problem Relation Age of Onset  . Diabetes Mother   . Breast cancer Mother   . Heart attack Mother        CABG - Age 3  . Kidney disease Mother   . Stroke Brother   . Lung disease Father   . Neurofibromatosis Maternal Uncle   . Throat cancer Brother   . Hypertension Brother     Past Surgical History:  Procedure Laterality Date  . BONE GRAFT HIP ILIAC CREST Left ~ 1976  . CATARACT EXTRACTION W/ INTRAOCULAR LENS  IMPLANT, BILATERAL Bilateral 2014-2015  . EP IMPLANTABLE DEVICE N/A 12/24/2015   Procedure: Loop Recorder  Insertion;  Surgeon: Thompson Grayer, MD;  Location: [facility];  Service: Cardiovascular;  Laterality: N/A;  . TEE WITHOUT CARDIOVERSION N/A 12/07/2017   Procedure: TRANSESOPHAGEAL ECHOCARDIOGRAM (TEE);  Surgeon: Fay Records, MD;  [facility]: [facility] ENDOSCOPY;  Service: Cardiovascular;  Laterality: N/A;  . TUMOR REMOVAL Right ~ 1976   "arm; had to take bone left hip to add to the repair"   Social History   Occupational History  . Occupation: Retired  Tobacco Use  . Smoking status: Never Smoker  . Smokeless tobacco: Never Used  Substance and Sexual Activity  . Alcohol use: Yes    Alcohol/week: 1.0  standard drinks    Types: 1 Cans of beer per week    Comment: 10/30/2014 "might have a beer a couple times/yr"  . Drug use: No  . Sexual activity: Yes

## 2018-08-05 ENCOUNTER — Encounter (INDEPENDENT_AMBULATORY_CARE_PROVIDER_SITE_OTHER): Payer: Self-pay | Admitting: Orthopedic Surgery

## 2018-08-05 ENCOUNTER — Ambulatory Visit (INDEPENDENT_AMBULATORY_CARE_PROVIDER_SITE_OTHER): Payer: Medicare Other | Admitting: Physician Assistant

## 2018-08-05 VITALS — Ht 76.0 in | Wt 173.0 lb

## 2018-08-05 DIAGNOSIS — L97511 Non-pressure chronic ulcer of other part of right foot limited to breakdown of skin: Secondary | ICD-10-CM | POA: Diagnosis not present

## 2018-08-05 DIAGNOSIS — I87323 Chronic venous hypertension (idiopathic) with inflammation of bilateral lower extremity: Secondary | ICD-10-CM

## 2018-08-05 DIAGNOSIS — E1142 Type 2 diabetes mellitus with diabetic polyneuropathy: Secondary | ICD-10-CM | POA: Diagnosis not present

## 2018-08-05 DIAGNOSIS — Z794 Long term (current) use of insulin: Secondary | ICD-10-CM

## 2018-08-05 IMAGING — MR MR FOOT*R* W/O CM
6 series · 40 of 40 positions shown · non-contrast
Comparison: MRI right foot 11/12/2017. Plain films right foot
11/11/2017.

CLINICAL DATA: Diabetic patient discharged from the hospital
11/18/2017 after an admission for sepsis possibly secondary to a
right foot infection. Continued low-grade fevers. Weakness for 1
day.

EXAM:
MRI OF THE RIGHT FOREFOOT WITHOUT CONTRAST
TECHNIQUE: Multiplanar, multisequence MR imaging of the right forefoot was
performed. No intravenous contrast was administered.

[Series 4001: STIR · sagittal · right · 3.0mm · 0.78mm/px · 7 of 32 slices shown (1 of 3)]
[im 1/32]
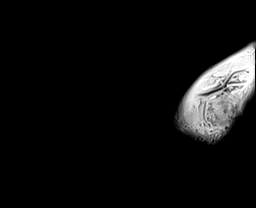
[im 6/32]
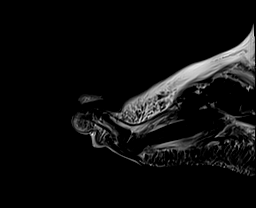
[im 11/32]
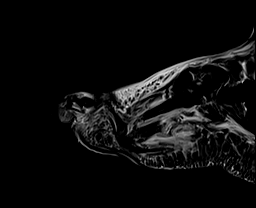
[im 16/32]
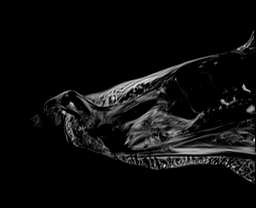
[im 21/32]
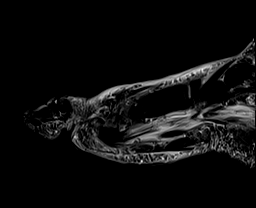
[im 26/32]
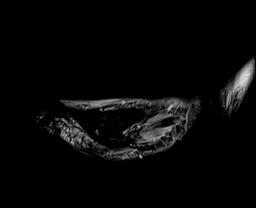
[im 32/32]
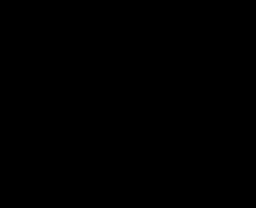

[Series 5001: T1 · sagittal · right · 3.0mm · 0.52mm/px · 7 of 32 slices shown (1 of 3)]
[im 1/32]
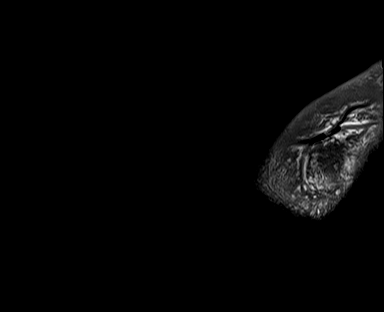
[im 6/32]
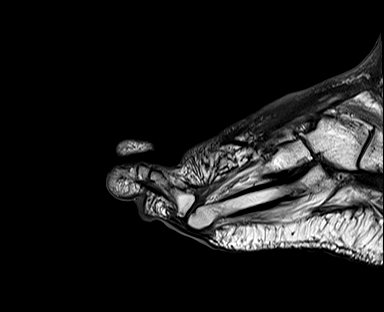
[im 11/32]
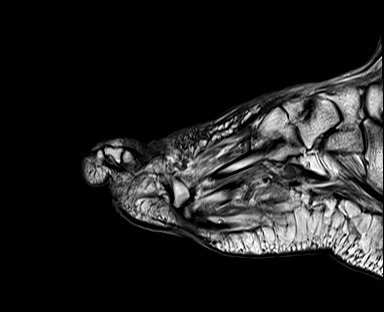
[im 16/32]
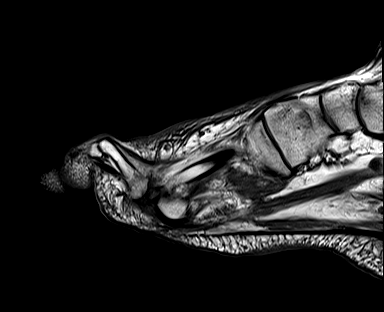
[im 21/32]
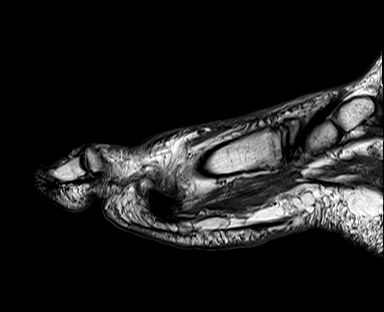
[im 26/32]
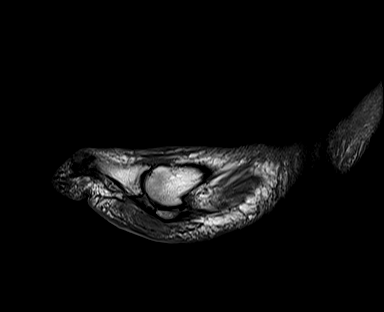
[im 32/32]
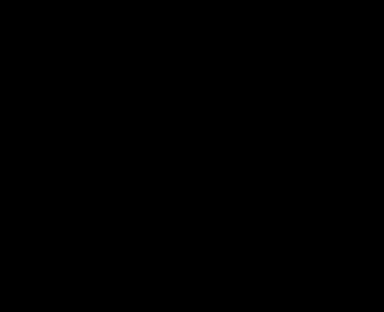

[Series 6001: T1 · coronal · right · 5.0mm · 0.51mm/px · 6 of 28 slices shown (2 of 3)]
[im 1/28]
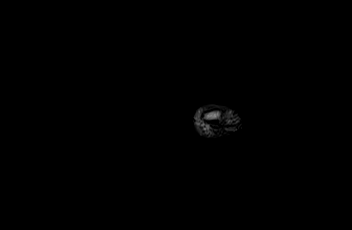
[im 6/28]
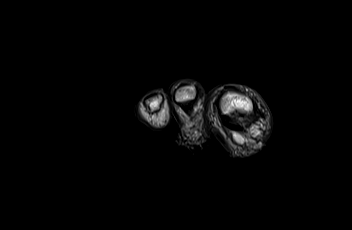
[im 11/28]
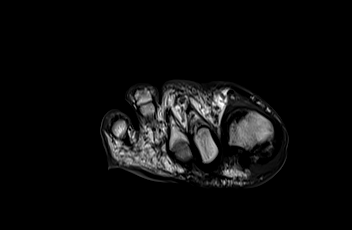
[im 17/28]
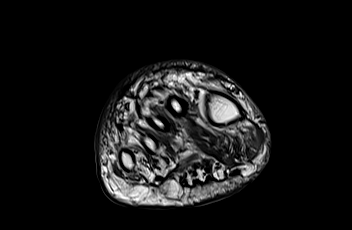
[im 22/28]
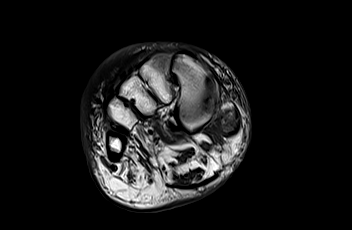
[im 28/28]
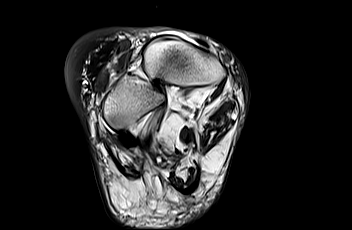

[Series 7001: STIR · coronal · right · 5.0mm · 0.70mm/px · 6 of 28 slices shown (2 of 3)]
[im 1/28]
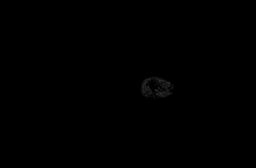
[im 6/28]
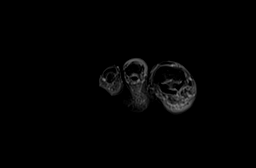
[im 11/28]
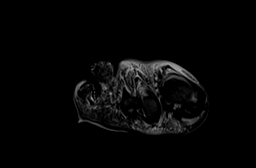
[im 17/28]
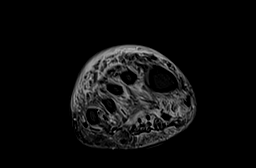
[im 22/28]
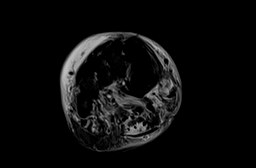
[im 28/28]
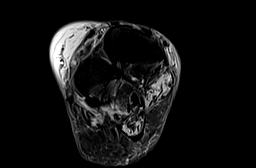

[Series 8001: T1 · axial · right · 3.0mm · 0.57mm/px · z∈[-132,-36]mm · 7 of 34 slices shown (3 of 3)]
[im 1/34]
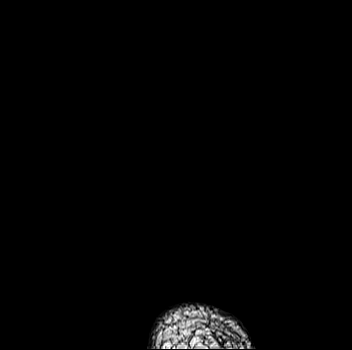
[im 6/34]
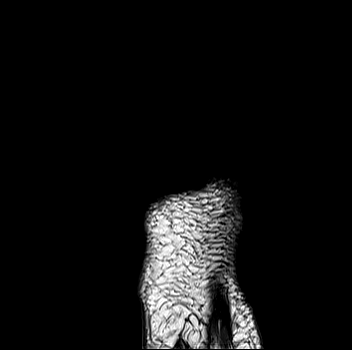
[im 12/34]
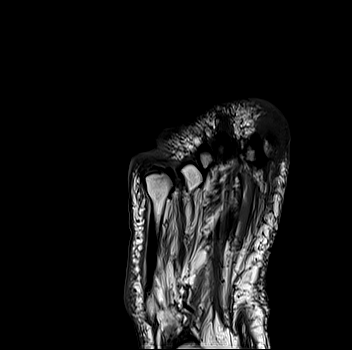
[im 17/34]
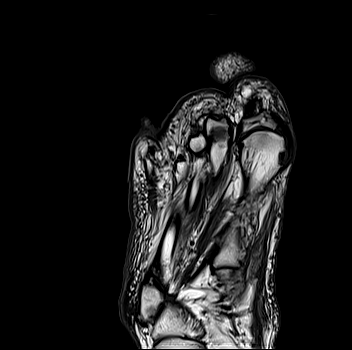
[im 23/34]
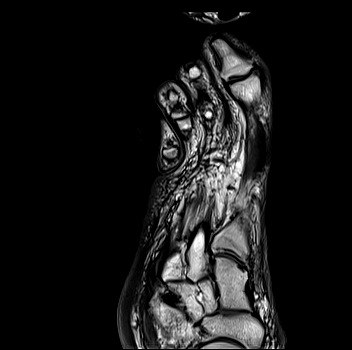
[im 28/34]
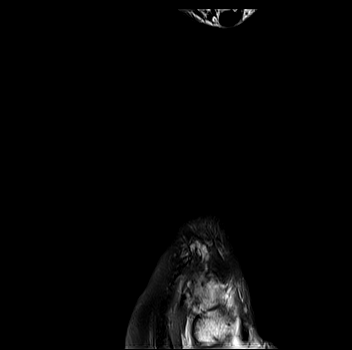
[im 34/34]
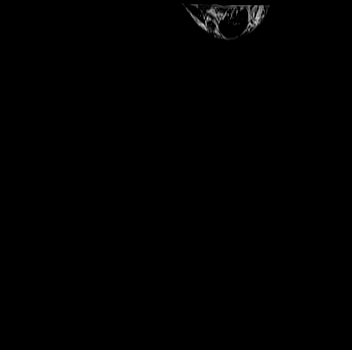

[Series 9001: STIR · axial · right · 3.0mm · 0.78mm/px · z∈[-132,-36]mm · 7 of 34 slices shown (3 of 3)]
[im 1/34]
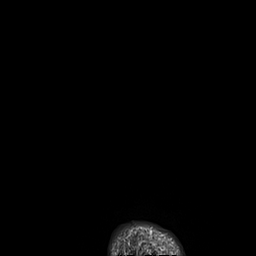
[im 6/34]
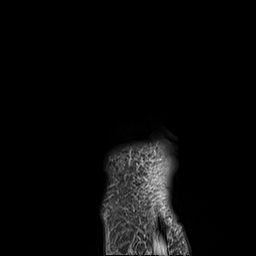
[im 12/34]
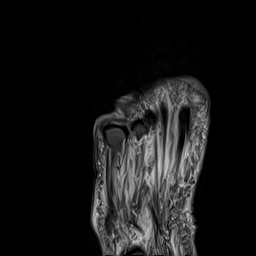
[im 17/34]
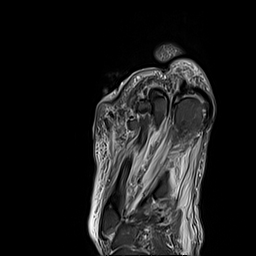
[im 23/34]
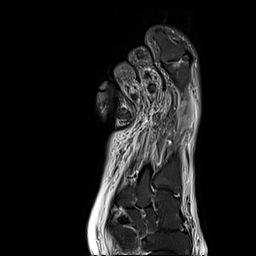
[im 28/34]
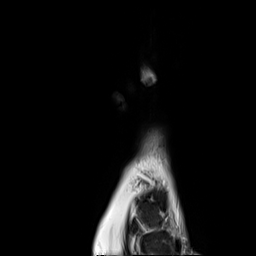
[im 34/34]
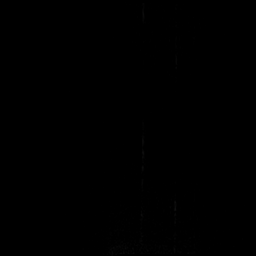

[40 of 40 positions shown; findings below may reference images not displayed]

FINDINGS: Bones/Joint/Cartilage

No bone marrow signal abnormality to suggest osteomyelitis is
identified. The patient has mild to moderate first MTP
osteoarthritis with associated mild edema in the medial sesamoid and
adjacent head of the first metatarsal.

Ligaments

Intact.

Muscles and Tendons

Increased T2 signal within intrinsic musculature the foot and mild
atrophy are most consistent with diabetic myopathy. No intramuscular
fluid collection. No tendon tear.

Soft tissues

Intense subcutaneous edema over the dorsum of the foot has worsened
since the prior examination. No focal fluid collection is
identified.
IMPRESSION: Increased subcutaneous edema over the dorsum of the foot could be
due to cellulitis and/or dependent change. No focal fluid collection
is suggest abscess is seen.

Negative for osteomyelitis or septic joint.

Mild increased T2 signal and atrophy of intrinsic musculature the
foot likely due to diabetic myopathy.

## 2018-08-06 ENCOUNTER — Encounter (INDEPENDENT_AMBULATORY_CARE_PROVIDER_SITE_OTHER): Payer: Self-pay | Admitting: Physician Assistant

## 2018-08-06 NOTE — Progress Notes (Signed)
Office Visit Note   Patient: Cody Silva           Date of Birth: 07/11/1946           MRN: [id number] Visit Date: 08/05/2018              [facility]ed by: [facility] [address] [place], [place] [address] PCP: [facility]  Chief Complaint  Patient presents with  . Right Foot - Pain, Follow-up      HPI: The patient is a 72 year old male who is seen for follow-up of his diabetic insensate neuropathy and venous insufficiency with heel cord contracture with a Waggoner grade 1 ulcer beneath the fifth metatarsal head on the right.  He reports some pain with weightbearing when the callus/ulcer gets more prominent.  He has extra-depth shoes and custom orthotics.  He does use Lac-Hydrin to his feet to help control the dry skin.  Assessment & Plan: Visit Diagnoses:  1. Right foot ulcer, limited to breakdown of skin ([place])   2. Idiopathic chronic venous hypertension of both lower extremities with inflammation   3. Type 2 diabetes mellitus with diabetic polyneuropathy, with long-term current use of insulin (HCC)     Plan: After informed consent the right foot callus was debrided with a #10 blade knife and the patient tolerated this well.  He is can a continue to utilize Lac-Hydrin to control the dry skin.  He will continue with heel cord stretching exercises.  He will follow-up here in 6 weeks or sooner should he have difficulties in the interim.  Follow-Up Instructions: Return in about 6 weeks (around 09/16/2018).   Ortho Exam  Patient is alert, oriented, no adenopathy, well-dressed, normal affect, normal respiratory effort. Minimal edema of lower extremities noted.  He has good pedal pulses.  The right foot fifth metatarsal head Waggoner grade 1 ulcer was debrided with a #10 blade knife after informed consent and the patient tolerated this well.  Skin and soft tissue were debrided back to healthy viable tissue with removal of the ulcerated  area.  There is no clear open area.  The callused area was approximately 2 cm in diameter.  No signs of infection or cellulitis.  Imaging: No results found. No images are attached to the encounter.  Labs: Lab Results  Component Value Date   HGBA1C 5.5 12/04/2017   HGBA1C 6.2 (H) 02/15/2017   HGBA1C 6.7 (H) 01/10/2017   ESRSEDRATE 32 (H) 12/05/2017   ESRSEDRATE 37 (H) 09/10/2016   CRP 9.5 (H) 12/05/2017   CRP 0.4 02/15/2017   REPTSTATUS 12/08/2017 FINAL 12/03/2017   CULT  12/03/2017    NO GROWTH 5 DAYS Performed at [facility], [place] [address] [place], [place] [address]    LABORGA GROUP A STREP (S.PYOGENES) ISOLATED 11/11/2017     Lab Results  Component Value Date   ALBUMIN 2.7 (L) 12/03/2017   ALBUMIN 3.6 11/11/2017   ALBUMIN 3.2 (L) 01/18/2017    Body mass index is 21.06 kg/m.  Orders:  No orders of the defined types were placed in this encounter.  No orders of the defined types were placed in this encounter.    Procedures: No procedures performed  Clinical Data: No additional findings.  ROS:  All other systems negative, except as noted in the HPI. Review of Systems  Objective: Vital Signs: Ht 6\' 4"  (1.93 m)   Wt 173 lb (78.5 kg)   BMI 21.06 kg/m   Specialty Comments:  No  specialty comments available.  PMFS History: Patient Active Problem List   Diagnosis Date Noted  . 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  . Achilles tendon contracture, bilateral 02/07/2018  . Right foot ulcer, limited to breakdown of skin ([place]) 02/07/2018  . Memory loss 02/06/2018  . Gait abnormality 02/06/2018  . Diabetic neuropathy ([place]) 02/04/2018  . Type II diabetes mellitus with foot ulcer ([place]) 12/05/2017  . Diabetic foot infection ([place])   . Fever 12/04/2017  . Fever and chills 12/04/2017  . Hx of bacteremia   . Wound eschar of foot   . Acute sepsis ([place]) 11/12/2017  . Scrotal pain   . Sepsis ([place]) 11/11/2017  . Atrophy of muscle of right hand 04/04/2017  .  Protein-calorie malnutrition, severe 01/19/2017  . Altered mental status 01/09/2017  . Facial droop 01/09/2017  . Cerebral thrombosis with cerebral infarction 01/09/2017  . Seizures ([place])   . Other hyperlipidemia   . OSA (obstructive sleep apnea) 09/12/2016  . Weight loss 09/12/2016  . Malnutrition of moderate degree 09/11/2016  . Syncope 09/10/2016  . H/O agent Orange exposure 12/24/2015  . Type 2 diabetes with nephropathy ([place])   . Near syncope 12/22/2015  . History of TIAs 09/14/2015  . CKD stage 3 due to type 2 diabetes mellitus ([place]) 09/14/2015  . Acute lower GI bleeding 10/30/2014  . History of colonic polyps 10/30/2014  . Depression 10/30/2014  . Symptomatic anemia 10/30/2014  . AKI (acute kidney injury) ([place]) 10/30/2014  . Ataxic gait 09/21/2014  . History of CVA (cerebrovascular accident) 02/22/2013  . Abnormal brain scan 02/21/2013  . Dizziness 02/21/2013  . Weakness 02/21/2013  . Diabetes mellitus ([place]) 02/21/2013  . Hypertension 02/21/2013   Past Medical History:  Diagnosis Date  . Anemia   . Arthritis    "right arm, right ankle, right side" (10/30/2014)  . Colon polyps   . Diabetic neuropathy ([place])   . Diabetic retinopathy ([place])   . H/O agent Orange exposure   . Headache    "@ least 3 times/wk" (10/30/2014)  . History of blood transfusion 10/30/2014   hematochezia  . History of gout   . Hypertension   . OSA on CPAP    "suppose to wear mask; I've got a call in for an equipment change" (10/30/2014)  . PTSD (post-traumatic stress disorder)    "service related"  . Seizures ([place])   . Stroke [facility]) 2014   left extremity deficits; facial left  . Type II diabetes mellitus (HCC)     Family History  Problem Relation Age of Onset  . Diabetes Mother   . Breast cancer Mother   . Heart attack Mother        CABG - Age 26  . Kidney disease Mother   . Stroke Brother   . Lung disease Father   . Neurofibromatosis Maternal Uncle   . Throat cancer Brother   .  Hypertension Brother     Past Surgical History:  Procedure Laterality Date  . BONE GRAFT HIP ILIAC CREST Left ~ 1976  . CATARACT EXTRACTION W/ INTRAOCULAR LENS  IMPLANT, BILATERAL Bilateral 2014-2015  . EP IMPLANTABLE DEVICE N/A 12/24/2015   Procedure: Loop Recorder Insertion;  Surgeon: Thompson Grayer, MD;  Location: [facility];  Service: Cardiovascular;  Laterality: N/A;  . TEE WITHOUT CARDIOVERSION N/A 12/07/2017   Procedure: TRANSESOPHAGEAL ECHOCARDIOGRAM (TEE);  Surgeon: Fay Records, MD;  Location: [facility];  Service: Cardiovascular;  Laterality: N/A;  . TUMOR REMOVAL Right ~ 1976   "  arm; had to take bone left hip to add to the repair"   Social History   Occupational History  . Occupation: Retired  Tobacco Use  . Smoking status: Never Smoker  . Smokeless tobacco: Never Used  Substance and Sexual Activity  . Alcohol use: Yes    Alcohol/week: 1.0 standard drinks    Types: 1 Cans of beer per week    Comment: 10/30/2014 "might have a beer a couple times/yr"  . Drug use: No  . Sexual activity: Yes

## 2018-09-16 ENCOUNTER — Ambulatory Visit (INDEPENDENT_AMBULATORY_CARE_PROVIDER_SITE_OTHER): Payer: Medicare Other | Admitting: Physician Assistant

## 2018-10-22 ENCOUNTER — Telehealth: Payer: Self-pay | Admitting: Neurology

## 2018-10-22 NOTE — Telephone Encounter (Addendum)
Pt's wife called states he saw PCP at [place]. He is having memory issues that PCP thinks could be related to high BP. Memory issues have been going on for the past year. Per RN Katrina pt will need referral from [place], messgae was relayed to the pt's wife, she was agreeable to getting referral.

## 2020-02-02 ENCOUNTER — Emergency Department (HOSPITAL_COMMUNITY): Payer: Medicare PPO

## 2020-02-02 ENCOUNTER — Inpatient Hospital Stay (HOSPITAL_COMMUNITY)
Admission: EM | Admit: 2020-02-02 | Discharge: 2020-02-07 | DRG: 683 | Disposition: A | Discharge status: Home / Self Care | Payer: Medicare PPO | Attending: Family Medicine | Admitting: Family Medicine

## 2020-02-02 ENCOUNTER — Encounter (HOSPITAL_COMMUNITY): Payer: Self-pay

## 2020-02-02 ENCOUNTER — Other Ambulatory Visit: Payer: Self-pay

## 2020-02-02 DIAGNOSIS — I152 Hypertension secondary to endocrine disorders: Secondary | ICD-10-CM | POA: Diagnosis present

## 2020-02-02 DIAGNOSIS — Z794 Long term (current) use of insulin: Secondary | ICD-10-CM

## 2020-02-02 DIAGNOSIS — I160 Hypertensive urgency: Secondary | ICD-10-CM | POA: Diagnosis present

## 2020-02-02 DIAGNOSIS — E1122 Type 2 diabetes mellitus with diabetic chronic kidney disease: Secondary | ICD-10-CM | POA: Diagnosis present

## 2020-02-02 DIAGNOSIS — E1121 Type 2 diabetes mellitus with diabetic nephropathy: Secondary | ICD-10-CM | POA: Diagnosis present

## 2020-02-02 DIAGNOSIS — R55 Syncope and collapse: Secondary | ICD-10-CM | POA: Diagnosis present

## 2020-02-02 DIAGNOSIS — F4310 Post-traumatic stress disorder, unspecified: Secondary | ICD-10-CM | POA: Diagnosis present

## 2020-02-02 DIAGNOSIS — Z8719 Personal history of other diseases of the digestive system: Secondary | ICD-10-CM

## 2020-02-02 DIAGNOSIS — E1140 Type 2 diabetes mellitus with diabetic neuropathy, unspecified: Secondary | ICD-10-CM | POA: Diagnosis present

## 2020-02-02 DIAGNOSIS — Z20822 Contact with and (suspected) exposure to covid-19: Secondary | ICD-10-CM | POA: Diagnosis present

## 2020-02-02 DIAGNOSIS — Z8249 Family history of ischemic heart disease and other diseases of the circulatory system: Secondary | ICD-10-CM

## 2020-02-02 DIAGNOSIS — I4581 Long QT syndrome: Secondary | ICD-10-CM | POA: Diagnosis present

## 2020-02-02 DIAGNOSIS — Z79899 Other long term (current) drug therapy: Secondary | ICD-10-CM

## 2020-02-02 DIAGNOSIS — Z888 Allergy status to other drugs, medicaments and biological substances status: Secondary | ICD-10-CM

## 2020-02-02 DIAGNOSIS — I951 Orthostatic hypotension: Secondary | ICD-10-CM | POA: Diagnosis present

## 2020-02-02 DIAGNOSIS — E1169 Type 2 diabetes mellitus with other specified complication: Secondary | ICD-10-CM

## 2020-02-02 DIAGNOSIS — Z841 Family history of disorders of kidney and ureter: Secondary | ICD-10-CM

## 2020-02-02 DIAGNOSIS — Z833 Family history of diabetes mellitus: Secondary | ICD-10-CM

## 2020-02-02 DIAGNOSIS — I161 Hypertensive emergency: Secondary | ICD-10-CM

## 2020-02-02 DIAGNOSIS — Z803 Family history of malignant neoplasm of breast: Secondary | ICD-10-CM

## 2020-02-02 DIAGNOSIS — H543 Unqualified visual loss, both eyes: Secondary | ICD-10-CM | POA: Diagnosis present

## 2020-02-02 DIAGNOSIS — E11319 Type 2 diabetes mellitus with unspecified diabetic retinopathy without macular edema: Secondary | ICD-10-CM | POA: Diagnosis present

## 2020-02-02 DIAGNOSIS — Z808 Family history of malignant neoplasm of other organs or systems: Secondary | ICD-10-CM

## 2020-02-02 DIAGNOSIS — N179 Acute kidney failure, unspecified: Principal | ICD-10-CM | POA: Diagnosis present

## 2020-02-02 DIAGNOSIS — G9608 Other cranial cerebrospinal fluid leak: Secondary | ICD-10-CM | POA: Diagnosis present

## 2020-02-02 DIAGNOSIS — Z7951 Long term (current) use of inhaled steroids: Secondary | ICD-10-CM

## 2020-02-02 DIAGNOSIS — N1832 Chronic kidney disease, stage 3b: Secondary | ICD-10-CM | POA: Diagnosis present

## 2020-02-02 DIAGNOSIS — Z7902 Long term (current) use of antithrombotics/antiplatelets: Secondary | ICD-10-CM

## 2020-02-02 DIAGNOSIS — Z9989 Dependence on other enabling machines and devices: Secondary | ICD-10-CM

## 2020-02-02 DIAGNOSIS — E785 Hyperlipidemia, unspecified: Secondary | ICD-10-CM | POA: Diagnosis present

## 2020-02-02 DIAGNOSIS — I69351 Hemiplegia and hemiparesis following cerebral infarction affecting right dominant side: Secondary | ICD-10-CM

## 2020-02-02 DIAGNOSIS — Z8673 Personal history of transient ischemic attack (TIA), and cerebral infarction without residual deficits: Secondary | ICD-10-CM

## 2020-02-02 DIAGNOSIS — I712 Thoracic aortic aneurysm, without rupture: Secondary | ICD-10-CM | POA: Diagnosis present

## 2020-02-02 DIAGNOSIS — F329 Major depressive disorder, single episode, unspecified: Secondary | ICD-10-CM | POA: Diagnosis present

## 2020-02-02 DIAGNOSIS — G4733 Obstructive sleep apnea (adult) (pediatric): Secondary | ICD-10-CM

## 2020-02-02 DIAGNOSIS — Z823 Family history of stroke: Secondary | ICD-10-CM

## 2020-02-02 LAB — URINALYSIS, ROUTINE W REFLEX MICROSCOPIC
Bacteria, UA: NONE SEEN
Bilirubin Urine: NEGATIVE
Glucose, UA: 50 mg/dL — AB
Ketones, ur: NEGATIVE mg/dL
Leukocytes,Ua: NEGATIVE
Nitrite: NEGATIVE
Protein, ur: >=300 mg/dL — AB
Specific Gravity, Urine: 1.012 (ref 1.005–1.030)
pH: 5 (ref 5.0–8.0)

## 2020-02-02 LAB — TROPONIN I (HIGH SENSITIVITY)
Troponin I (High Sensitivity): 44 ng/L — ABNORMAL HIGH (ref <18)
Troponin I (High Sensitivity): 40 ng/L — ABNORMAL HIGH (ref <18)

## 2020-02-02 LAB — CBC WITH DIFFERENTIAL/PLATELET
Abs Immature Granulocytes: 0.01 10*3/uL (ref 0.00–0.07)
Basophils Absolute: 0 10*3/uL (ref 0.0–0.1)
Basophils Relative: 1 %
Eosinophils Absolute: 0 10*3/uL (ref 0.0–0.5)
Eosinophils Relative: 1 %
HCT: 30.3 % — ABNORMAL LOW (ref 39.0–52.0)
Hemoglobin: 9.9 g/dL — ABNORMAL LOW (ref 13.0–17.0)
Immature Granulocytes: 0 %
Lymphocytes Relative: 15 %
Lymphs Abs: 0.6 10*3/uL — ABNORMAL LOW (ref 0.7–4.0)
MCH: 30.2 pg (ref 26.0–34.0)
MCHC: 32.7 g/dL (ref 30.0–36.0)
MCV: 92.4 fL (ref 80.0–100.0)
Monocytes Absolute: 0.3 10*3/uL (ref 0.1–1.0)
Monocytes Relative: 8 %
Neutro Abs: 3.1 10*3/uL (ref 1.7–7.7)
Neutrophils Relative %: 75 %
Platelets: 154 10*3/uL (ref 150–400)
RBC: 3.28 MIL/uL — ABNORMAL LOW (ref 4.22–5.81)
RDW: 13.3 % (ref 11.5–15.5)
WBC: 4.1 10*3/uL (ref 4.0–10.5)
nRBC: 0 % (ref 0.0–0.2)

## 2020-02-02 LAB — BASIC METABOLIC PANEL
Anion gap: 8 (ref 5–15)
BUN: 31 mg/dL — ABNORMAL HIGH (ref 8–23)
CO2: 24 mmol/L (ref 22–32)
Calcium: 8.9 mg/dL (ref 8.9–10.3)
Chloride: 107 mmol/L (ref 98–111)
Creatinine, Ser: 3.29 mg/dL — ABNORMAL HIGH (ref 0.61–1.24)
GFR calc Af Amer: 21 mL/min — ABNORMAL LOW (ref >60)
GFR calc non Af Amer: 18 mL/min — ABNORMAL LOW (ref >60)
Glucose, Bld: 118 mg/dL — ABNORMAL HIGH (ref 70–99)
Potassium: 4 mmol/L (ref 3.5–5.1)
Sodium: 139 mmol/L (ref 135–145)

## 2020-02-02 LAB — HEMOGLOBIN A1C
Hgb A1c MFr Bld: 5.9 % — ABNORMAL HIGH (ref 4.8–5.6)
Mean Plasma Glucose: 122.63 mg/dL

## 2020-02-02 LAB — CBG MONITORING, ED: Glucose-Capillary: 108 mg/dL — ABNORMAL HIGH (ref 70–99)

## 2020-02-02 LAB — MAGNESIUM: Magnesium: 1.5 mg/dL — ABNORMAL LOW (ref 1.7–2.4)

## 2020-02-02 LAB — SARS CORONAVIRUS 2 BY RT PCR (HOSPITAL ORDER, PERFORMED IN ~~LOC~~ HOSPITAL LAB): SARS Coronavirus 2: NEGATIVE

## 2020-02-02 LAB — GLUCOSE, CAPILLARY: Glucose-Capillary: 109 mg/dL — ABNORMAL HIGH (ref 70–99)

## 2020-02-02 MED ORDER — TAMSULOSIN HCL 0.4 MG PO CAPS
0.8000 mg | ORAL_CAPSULE | Freq: Every day | ORAL | Status: DC
  Administered 2020-02-02 – 2020-02-06 (×5): 0.8 mg via ORAL
  Filled 2020-02-02 (×5): qty 2

## 2020-02-02 MED ORDER — AMLODIPINE BESYLATE 5 MG PO TABS
5.0000 mg | ORAL_TABLET | Freq: Every day | ORAL | Status: DC
  Administered 2020-02-03 – 2020-02-07 (×5): 5 mg via ORAL
  Filled 2020-02-02 (×6): qty 1

## 2020-02-02 MED ORDER — ACETAMINOPHEN 325 MG PO TABS: 650.0000 mg | ORAL_TABLET | Freq: Four times a day (QID) | ORAL | Status: DC | PRN

## 2020-02-02 MED ORDER — HEPARIN SODIUM (PORCINE) 5000 UNIT/ML IJ SOLN
5000.0000 [IU] | Freq: Three times a day (TID) | INTRAMUSCULAR | Status: DC
  Administered 2020-02-02 – 2020-02-07 (×13): 5000 [IU] via SUBCUTANEOUS
  Filled 2020-02-02 (×14): qty 1

## 2020-02-02 MED ORDER — SODIUM CHLORIDE 0.9 % IV SOLN: INTRAVENOUS | Status: DC

## 2020-02-02 MED ORDER — ATORVASTATIN CALCIUM 40 MG PO TABS
40.0000 mg | ORAL_TABLET | Freq: Every day | ORAL | Status: DC
  Administered 2020-02-02 – 2020-02-06 (×5): 40 mg via ORAL
  Filled 2020-02-02 (×5): qty 1

## 2020-02-02 MED ORDER — ENOXAPARIN SODIUM 40 MG/0.4ML ~~LOC~~ SOLN: 40.0000 mg | SUBCUTANEOUS | Status: DC

## 2020-02-02 MED ORDER — CLOPIDOGREL BISULFATE 75 MG PO TABS
75.0000 mg | ORAL_TABLET | Freq: Every day | ORAL | Status: DC
  Administered 2020-02-03 – 2020-02-07 (×5): 75 mg via ORAL
  Filled 2020-02-02 (×6): qty 1

## 2020-02-02 MED ORDER — CLONIDINE HCL 0.3 MG/24HR TD PTWK: 0.3000 mg | MEDICATED_PATCH | TRANSDERMAL | Status: DC

## 2020-02-02 MED ORDER — AMLODIPINE BESYLATE 5 MG PO TABS
5.0000 mg | ORAL_TABLET | Freq: Once | ORAL | Status: AC
  Administered 2020-02-02: 5 mg via ORAL
  Filled 2020-02-02: qty 1

## 2020-02-02 MED ORDER — MOMETASONE FURO-FORMOTEROL FUM 100-5 MCG/ACT IN AERO
2.0000 | INHALATION_SPRAY | Freq: Two times a day (BID) | RESPIRATORY_TRACT | Status: DC
  Administered 2020-02-03 – 2020-02-07 (×8): 2 via RESPIRATORY_TRACT
  Filled 2020-02-02: qty 8.8

## 2020-02-02 MED ORDER — CARVEDILOL 25 MG PO TABS
25.0000 mg | ORAL_TABLET | Freq: Two times a day (BID) | ORAL | Status: DC
  Administered 2020-02-02 – 2020-02-07 (×10): 25 mg via ORAL
  Filled 2020-02-02: qty 1
  Filled 2020-02-02: qty 2
  Filled 2020-02-02 (×8): qty 1

## 2020-02-02 MED ORDER — MONTELUKAST SODIUM 10 MG PO TABS
10.0000 mg | ORAL_TABLET | Freq: Every day | ORAL | Status: DC
  Administered 2020-02-02 – 2020-02-06 (×5): 10 mg via ORAL
  Filled 2020-02-02 (×5): qty 1

## 2020-02-02 MED ORDER — CHLORTHALIDONE 25 MG PO TABS
25.0000 mg | ORAL_TABLET | Freq: Every day | ORAL | Status: DC
  Administered 2020-02-03 – 2020-02-04 (×2): 25 mg via ORAL
  Filled 2020-02-02 (×2): qty 1

## 2020-02-02 MED ORDER — ESCITALOPRAM OXALATE 10 MG PO TABS
10.0000 mg | ORAL_TABLET | Freq: Every day | ORAL | Status: DC
  Administered 2020-02-02 – 2020-02-06 (×5): 10 mg via ORAL
  Filled 2020-02-02 (×5): qty 1

## 2020-02-02 MED ORDER — GABAPENTIN 300 MG PO CAPS
300.0000 mg | ORAL_CAPSULE | Freq: Every day | ORAL | Status: DC
  Administered 2020-02-02 – 2020-02-06 (×5): 300 mg via ORAL
  Filled 2020-02-02 (×5): qty 1

## 2020-02-02 MED ORDER — BUPROPION HCL 75 MG PO TABS
75.0000 mg | ORAL_TABLET | Freq: Every day | ORAL | Status: DC
  Administered 2020-02-03 – 2020-02-07 (×5): 75 mg via ORAL
  Filled 2020-02-02 (×5): qty 1

## 2020-02-02 MED ORDER — ACETAMINOPHEN 650 MG RE SUPP: 650.0000 mg | Freq: Four times a day (QID) | RECTAL | Status: DC | PRN

## 2020-02-02 MED ORDER — ALUM & MAG HYDROXIDE-SIMETH 200-200-20 MG/5ML PO SUSP: 30.0000 mL | Freq: Four times a day (QID) | ORAL | Status: DC | PRN

## 2020-02-02 MED ORDER — SODIUM CHLORIDE 0.9% FLUSH
3.0000 mL | Freq: Two times a day (BID) | INTRAVENOUS | Status: DC
  Administered 2020-02-02 – 2020-02-07 (×9): 3 mL via INTRAVENOUS

## 2020-02-02 MED ORDER — HYDRALAZINE HCL 10 MG PO TABS
10.0000 mg | ORAL_TABLET | Freq: Four times a day (QID) | ORAL | Status: DC | PRN
  Administered 2020-02-03: 10 mg via ORAL
  Filled 2020-02-02 (×2): qty 1

## 2020-02-02 MED ORDER — FINASTERIDE 5 MG PO TABS
5.0000 mg | ORAL_TABLET | Freq: Every day | ORAL | Status: DC
  Administered 2020-02-02 – 2020-02-06 (×5): 5 mg via ORAL
  Filled 2020-02-02 (×5): qty 1

## 2020-02-02 MED ORDER — LABETALOL HCL 5 MG/ML IV SOLN: 10.0000 mg | Freq: Four times a day (QID) | INTRAVENOUS | Status: DC | PRN

## 2020-02-02 MED ORDER — ALBUTEROL SULFATE (2.5 MG/3ML) 0.083% IN NEBU: 2.5000 mg | INHALATION_SOLUTION | Freq: Four times a day (QID) | RESPIRATORY_TRACT | Status: DC | PRN

## 2020-02-02 MED ORDER — HYDRALAZINE HCL 20 MG/ML IJ SOLN
20.0000 mg | Freq: Once | INTRAMUSCULAR | Status: AC
  Administered 2020-02-02: 20 mg via INTRAVENOUS
  Filled 2020-02-02: qty 1

## 2020-02-02 NOTE — ED Triage Notes (Signed)
BIB EMS from home where patient had near syncope episode. Per EMS patient pale, diaphoretic, C/O dizziness upon their arrival. Hypertensive en route. A/OX4

## 2020-02-02 NOTE — ED Notes (Signed)
Pt transported to CT ?

## 2020-02-02 NOTE — ED Provider Notes (Signed)
[facility] EMERGENCY DEPARTMENT Provider Note   CSN: [id number] Arrival date & time: 02/02/20  1542     History Chief Complaint  Patient presents with  . Near Syncope    Cody Silva is a 74 y.o. male with history of type 2 diabetes mellitus, CVA with residual right-sided deficits, OSA, PTSD, diabetic neuropathy, hypertension presents brought in by EMS for evaluation of syncopal episode.  Patient reports that he had been sitting out on his porch in the sun when he began to feel suddenly very weak all over, stood up to go inside and reportedly lost consciousness per family's report to EMS.  Patient does not remember losing consciousness.  He also reportedly had some nausea and vomiting but does not remember this.  Denies chest pain, shortness of breath, abdominal pain, headaches.  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  He reports right-sided chronic deficits from prior CVA in 2015 which she states are at baseline today.  At this time he states that he feels "better but still weak".  He is a non-smoker.  He is not on any blood thinners.  He reports compliance with his home medications but states he did not take his blood pressure medicines today.  I spoke with the patient's wife Freda Munro on the phone with patient's permission.  She reports that the patient was sitting on the porch when he began to feel weak.  She notes that he stood up, walked to the door frame and then "he seemed like he did not know what we were talking about and could not understand [place]".  She reports that he left as though he was about to lose consciousness and so family member lowered him to the ground.  She states he was very diaphoretic and had several episodes of nonbloody nonbilious emesis.  She states that it took the patient 4 to 5 minutes before he was more alert.  She is unsure if he completely lost consciousness.  She states that  the patient does not take any of his morning medications but that he is prescribed chlorthalidone, carvedilol, clopidogrel and amlodipine.  She states he also has a clonidine patch that is changed once weekly and he was wearing this but due to his excessive diaphoresis the patch slipped off.  She does note that the patient was told that he had "kidney issues" at the [place] "but that it was not bad enough to put him on dialysis".  She does not know what his most recent renal function was.    The history is provided by the patient and the spouse.       Past Medical History:  Diagnosis Date  . Anemia   . Arthritis    "right arm, right ankle, right side" (10/30/2014)  . Colon polyps   . Diabetic neuropathy ([place])   . Diabetic retinopathy ([place])   . H/O agent Orange exposure   . Headache    "@ least 3 times/wk" (10/30/2014)  . History of blood transfusion 10/30/2014   hematochezia  . History of gout   . Hypertension   . OSA on CPAP    "suppose to wear mask; I've got a call in for an equipment change" (10/30/2014)  . PTSD (post-traumatic stress disorder)    "service related"  . Seizures ([place])   . Stroke [facility]) 2014   left extremity deficits; facial left  . Type II diabetes mellitus ([place])  Patient Active Problem List   Diagnosis Date Noted  . Hyperlipidemia associated with type 2 diabetes mellitus ([place]) 02/02/2020  . 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  . Achilles tendon contracture, bilateral 02/07/2018  . Right foot ulcer, limited to breakdown of skin ([place]) 02/07/2018  . Memory loss 02/06/2018  . Gait abnormality 02/06/2018  . Diabetic neuropathy ([place]) 02/04/2018  . Type II diabetes mellitus with foot ulcer ([place]) 12/05/2017  . Diabetic foot infection ([place])   . Fever 12/04/2017  . Fever and chills 12/04/2017  . Hx of bacteremia   . Wound eschar of foot   . Acute sepsis ([place]) 11/12/2017  . Scrotal pain   . Sepsis ([place]) 11/11/2017  . Atrophy of muscle of right hand 04/04/2017  .  Protein-calorie malnutrition, severe 01/19/2017  . Altered mental status 01/09/2017  . Facial droop 01/09/2017  . Cerebral thrombosis with cerebral infarction 01/09/2017  . Seizures ([place])   . Other hyperlipidemia   . OSA on CPAP 09/12/2016  . Weight loss 09/12/2016  . Malnutrition of moderate degree 09/11/2016  . Syncope 09/10/2016  . H/O agent Orange exposure 12/24/2015  . Type 2 diabetes with nephropathy ([place])   . Near syncope 12/22/2015  . History of TIAs 09/14/2015  . CKD stage 3 due to type 2 diabetes mellitus ([place]) 09/14/2015  . Acute lower GI bleeding 10/30/2014  . History of colonic polyps 10/30/2014  . Depression 10/30/2014  . Symptomatic anemia 10/30/2014  . Acute renal failure superimposed on stage 3b chronic kidney disease ([place]) 10/30/2014  . Ataxic gait 09/21/2014  . History of CVA (cerebrovascular accident) 02/22/2013  . Abnormal brain scan 02/21/2013  . Dizziness 02/21/2013  . Weakness 02/21/2013  . Diabetes mellitus ([place]) 02/21/2013  . Hypertension associated with diabetes ([place]) 02/21/2013    Past Surgical History:  Procedure Laterality Date  . BONE GRAFT HIP ILIAC CREST Left ~ 1976  . CATARACT EXTRACTION W/ INTRAOCULAR LENS  IMPLANT, BILATERAL Bilateral 2014-2015  . EP IMPLANTABLE DEVICE N/A 12/24/2015   Procedure: Loop Recorder Insertion;  Surgeon: Thompson Grayer, MD;  Location: [facility] CV LAB;  Service: Cardiovascular;  Laterality: N/A;  . TEE WITHOUT CARDIOVERSION N/A 12/07/2017   Procedure: TRANSESOPHAGEAL ECHOCARDIOGRAM (TEE);  Surgeon: Fay Records, MD;  Location[facility] ENDOSCOPY;  Service: Cardiovascular;  Laterality: N/A;  . TUMOR REMOVAL Right ~ 1976   "arm; had to take bone left hip to add to the repair"       Family History  Problem Relation Age of Onset  . Diabetes Mother   . Breast cancer Mother   . Heart attack Mother        CABG - Age 31  . Kidney disease Mother   . Stroke Brother   . Lung disease Father   . Neurofibromatosis Maternal  Uncle   . Throat cancer Brother   . Hypertension Brother     Social History   Tobacco Use  . Smoking status: Never Smoker  . Smokeless tobacco: Never Used  Substance Use Topics  . Alcohol use: Yes    Alcohol/week: 1.0 standard drinks    Types: 1 Cans of beer per week    Comment: 10/30/2014 "might have a beer a couple times/yr"  . Drug use: No    Home Medications Prior to Admission medications   Medication Sig Start Date End Date Taking? Authorizing Provider  acetaminophen (TYLENOL) 500 MG tablet Take 1 tablet (500 mg total) by mouth every 6 (six) hours as needed for mild pain, moderate pain,  fever or headache. 11/01/14  Yes Rabbani, Ricarda Frame, MD  albuterol (PROVENTIL HFA;VENTOLIN HFA) 108 (90 Base) MCG/ACT inhaler Inhale 1 puff into the lungs every 6 (six) hours as needed for wheezing or shortness of breath.   Yes [provider]  amLODipine (NORVASC) 5 MG tablet Take 1 tablet (5 mg total) by mouth daily. 12/08/17  Yes Katherine Roan, MD  ammonium lactate (LAC-HYDRIN) 12 % lotion Apply 1 application topically as needed for dry skin.   Yes [provider]  atorvastatin (LIPITOR) 40 MG tablet Take 40 mg by mouth at bedtime.  01/07/20  Yes [provider]  budesonide-formoterol (SYMBICORT) 80-4.5 MCG/ACT inhaler Inhale 2 puffs into the lungs 2 (two) times daily.   Yes [provider]  buPROPion (WELLBUTRIN) 75 MG tablet Take 75 mg by mouth daily.   Yes [provider]  Carboxymethylcellulose Sod PF 0.25 % SOLN Place 1 drop into both eyes 4 (four) times daily.   Yes [provider]  carvedilol (COREG) 25 MG tablet Take 25 mg by mouth 2 (two) times daily with a meal.   Yes [provider]  cetirizine (ZYRTEC) 10 MG tablet Take 10 mg by mouth at bedtime as needed for allergies.   Yes [provider]  chlorthalidone (HYGROTON) 25 MG tablet Take 25 mg by mouth daily. 11/26/19  Yes [provider]  Cholecalciferol  (VITAMIN D3) 2000 units TABS Take 2,000 Units by mouth daily.   Yes [provider]  cloNIDine (CATAPRES - DOSED IN MG/24 HR) 0.3 mg/24hr patch Replace old patch with new patch every wednesday Patient taking differently: Place 0.3 mg onto the skin once a week. Saturdays 12/07/17  Yes Katherine Roan, MD  clopidogrel (PLAVIX) 75 MG tablet Take 1 tablet (75 mg total) by mouth at bedtime. Patient taking differently: Take 75 mg by mouth daily.  11/08/14  Yes Rabbani, Ricarda Frame, MD  dorzolamide-timolol (COSOPT) 22.3-6.8 MG/ML ophthalmic solution Place 1 drop into both eyes as needed (as directed).    Yes [provider]  escitalopram (LEXAPRO) 10 MG tablet Take 10 mg by mouth at bedtime.  11/26/19  Yes [provider]  finasteride (PROSCAR) 5 MG tablet Take 5 mg by mouth at bedtime.  10/24/19  Yes [provider]  fluticasone (FLONASE) 50 MCG/ACT nasal spray Place 1 spray into both nostrils as needed for allergies.    Yes [provider]  gabapentin (NEURONTIN) 300 MG capsule Take 300 mg by mouth at bedtime.    Yes [provider]  ketotifen (ZADITOR) 0.025 % ophthalmic solution Place 1 drop into both eyes daily as needed (for itching or irritation).    Yes [provider]  latanoprost (XALATAN) 0.005 % ophthalmic solution Place 1 drop into both eyes at bedtime.    Yes [provider]  losartan (COZAAR) 100 MG tablet Take 100 mg by mouth daily.   Yes [provider]  Melatonin 3 MG TABS Take 3 mg by mouth at bedtime.   Yes [provider]  montelukast (SINGULAIR) 10 MG tablet Take 10 mg by mouth at bedtime.   Yes [provider]  oxybutynin (DITROPAN-XL) 5 MG 24 hr tablet Take 5 mg by mouth daily. 11/26/19  Yes [provider]  tamsulosin (FLOMAX) 0.4 MG CAPS capsule Take 0.8 mg by mouth at bedtime.   Yes [provider]  terbinafine (LAMISIL) 1 % cream Apply 1 application topically 2 (two) times  daily.   Yes [provider]  tolnaftate (TINACTIN) 1 % powder Apply 1 application topically 2 (two) times daily.   Yes [provider]    Allergies    Metformin and related, Milk-related compounds, and Tape  Review of Systems   Review of Systems  Constitutional: Negative for chills and fever.  Eyes: Negative for visual disturbance.  Respiratory: Negative for shortness of breath.   Cardiovascular: Negative for chest pain.  Gastrointestinal: Positive for nausea and vomiting.  Neurological: Positive for syncope and weakness. Negative for numbness and headaches.  All other systems reviewed and are negative.   Physical Exam Updated Vital Signs BP (!) 159/119 (BP Location: Right Arm)   Pulse 91   Temp 98.1 F (36.7 C) (Oral)   Resp 18   SpO2 100%   Physical Exam Vitals and nursing note reviewed.  Constitutional:      General: He is not in acute distress.    Appearance: He is well-developed.  HENT:     Head: Normocephalic and atraumatic.     Comments: No Battle's signs, no raccoon's eyes, no rhinorrhea. No hemotympanum. No tenderness to palpation of the face or skull. No deformity, crepitus, or swelling noted.  Eyes:     General:        Right eye: No discharge.        Left eye: No discharge.     Conjunctiva/sclera: Conjunctivae normal.     Comments: Left pupil fixed at 22mm, right pupil reactive to light.   Neck:     Vascular: No JVD.     Trachea: No tracheal deviation.  Cardiovascular:     Rate and Rhythm: Normal rate and regular rhythm.  Pulmonary:     Effort: Pulmonary effort is normal.     Breath sounds: Normal breath sounds.  Abdominal:     General: Bowel sounds are normal. There is no distension.     Palpations: Abdomen is soft.     Tenderness: There is no guarding or rebound.  Musculoskeletal:     Cervical back: Neck supple.  Skin:    General: Skin is warm and dry.     Findings: No erythema.  Neurological:     Mental Status: He is alert  and oriented to person, place, and time.     Comments: Mental Status:  Alert, thought content appropriate, able to give a coherent history. Speech fluent without evidence of aphasia. Able to follow 2 step commands without difficulty.  Cranial Nerves: Mild right-sided facial droop.  Otherwise cranial nerves appear grossly intact.  Sensation intact to light touch of face bilaterally.  Chronic visual deficits. Motor:  Normal tone. 5/5 strength of BUE and BLE major muscle groups including strong and equal grip strength and dorsiflexion/plantar flexion Sensory: light touch normal in all extremities. Cerebellar: normal finger-to-nose with bilateral upper extremities, Romberg sign absent Gait: Ambulates with steady gait and good balance.  Some difficulty with heel walk and toe walk.  Psychiatric:        Behavior: Behavior normal.     ED Results / Procedures / Treatments   Labs (all labs ordered are listed, but only abnormal results are displayed) Labs Reviewed  BASIC METABOLIC PANEL - Abnormal; Notable for the following components:      Result Value   Glucose, Bld 118 (*)    BUN 31 (*)    Creatinine, Ser 3.29 (*)    GFR calc non Af Amer 18 (*)    GFR calc Af Amer 21 (*)    All other components within  normal limits  CBC WITH DIFFERENTIAL/PLATELET - Abnormal; Notable for the following components:   RBC 3.28 (*)    Hemoglobin 9.9 (*)    HCT 30.3 (*)    Lymphs Abs 0.6 (*)    All other components within normal limits  URINALYSIS, ROUTINE W REFLEX MICROSCOPIC - Abnormal; Notable for the following components:   Glucose, UA 50 (*)    Hgb urine dipstick MODERATE (*)    Protein, ur >=300 (*)    All other components within normal limits  GLUCOSE, CAPILLARY - Abnormal; Notable for the following components:   Glucose-Capillary 109 (*)    All other components within normal limits  CBG MONITORING, ED - Abnormal; Notable for the following components:   Glucose-Capillary 108 (*)    All other  components within normal limits  TROPONIN I (HIGH SENSITIVITY) - Abnormal; Notable for the following components:   Troponin I (High Sensitivity) 44 (*)    All other components within normal limits  TROPONIN I (HIGH SENSITIVITY) - Abnormal; Notable for the following components:   Troponin I (High Sensitivity) 40 (*)    All other components within normal limits  SARS CORONAVIRUS 2 BY RT PCR (HOSPITAL ORDER, [place] [facility])  MAGNESIUM  HEMOGLOBIN A1C  CBC  BASIC METABOLIC PANEL  CREATININE, URINE, RANDOM  SODIUM, URINE, RANDOM    EKG EKG Interpretation  Date/Time:  Monday Feb 02 2020 15:52:14 EDT Ventricular Rate:  75 PR Interval:    QRS Duration: 111 QT Interval:  449 QTC Calculation: 502 R Axis:     Text Interpretation: Sinus rhythm Left atrial enlargement Prolonged QT interval similar to 2019 Confirmed by Sherwood Gambler [phone]) on 02/02/2020 5:56:27 PM   Radiology DG Chest 2 View  Result Date: 02/02/2020 CLINICAL DATA:  Weakness EXAM: CHEST - 2 VIEW COMPARISON:  None. FINDINGS: Cardiomegaly. The hila, mediastinum, lungs, and pleura are otherwise normal. IMPRESSION: No active cardiopulmonary disease. Electronically Signed   By: Dorise Bullion III M.D   On: 02/02/2020 16:39   CT Head Wo Contrast  Result Date: 02/02/2020 CLINICAL DATA:  Syncopal episode today. Dizziness and diaphoresis. Hypertension. EXAM: CT HEAD WITHOUT CONTRAST TECHNIQUE: Contiguous axial images were obtained from the base of the skull through the vertex without intravenous contrast. COMPARISON:  11/14/2017 FINDINGS: Brain: No evidence of acute infarction, hemorrhage, hydrocephalus, or mass lesion/mass effect. Mild generalized cerebral atrophy and moderate chronic small vessel disease are stable in appearance. An old small low-attenuation left parietal subdural hygroma measures approximately 8 mm in thickness and is stable since previous study. Vascular:  No hyperdense vessel or other acute  findings. Skull: No evidence of fracture or other significant bone abnormality. Sinuses/Orbits:  No acute findings. Other: None. IMPRESSION: 1. No acute intracranial abnormality. 2. Stable cerebral atrophy and chronic small vessel disease. 3. 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 Electronically Signed   By: Marlaine Hind M.D.   On: 02/02/2020 16:36    Procedures .Critical Care Performed by: Renita Papa, PA-C Authorized by: Renita Papa, PA-C   Critical care provider statement:    Critical care time (minutes):  45   Critical care was necessary to treat or prevent imminent or life-threatening deterioration of the following conditions:  Dehydration and cardiac failure   Critical care was time spent personally by me on the following activities:  Discussions with consultants, evaluation of patient's response to treatment, examination of patient, ordering and performing treatments and interventions, ordering and review of laboratory studies, ordering  and review of radiographic studies, pulse oximetry, re-evaluation of patient's condition, obtaining history from patient or surrogate and review of old charts   (including critical care time)  Medications Ordered in ED Medications  carvedilol (COREG) tablet 25 mg (25 mg Oral Given 02/02/20 1734)  sodium chloride flush (NS) 0.9 % injection 3 mL (has no administration in time range)  0.9 %  sodium chloride infusion ( Intravenous Transfusing/Transfer 02/02/20 2136)  acetaminophen (TYLENOL) tablet 650 mg (has no administration in time range)    Or  acetaminophen (TYLENOL) suppository 650 mg (has no administration in time range)  alum & mag hydroxide-simeth (MAALOX/MYLANTA) 200-200-20 MG/5ML suspension 30 mL (has no administration in time range)  heparin injection 5,000 Units (has no administration in time range)  hydrALAZINE (APRESOLINE) tablet 10 mg (has no administration in time range)  albuterol (PROVENTIL) (2.5 MG/3ML) 0.083% nebulizer  solution 2.5 mg (has no administration in time range)  amLODipine (NORVASC) tablet 5 mg (has no administration in time range)  atorvastatin (LIPITOR) tablet 40 mg (has no administration in time range)  mometasone-formoterol (DULERA) 100-5 MCG/ACT inhaler 2 puff (has no administration in time range)  buPROPion (WELLBUTRIN) tablet 75 mg (has no administration in time range)  chlorthalidone (HYGROTON) tablet 25 mg (has no administration in time range)  cloNIDine (CATAPRES - Dosed in mg/24 hr) patch 0.3 mg (has no administration in time range)  clopidogrel (PLAVIX) tablet 75 mg (has no administration in time range)  escitalopram (LEXAPRO) tablet 10 mg (has no administration in time range)  finasteride (PROSCAR) tablet 5 mg (has no administration in time range)  gabapentin (NEURONTIN) capsule 300 mg (has no administration in time range)  montelukast (SINGULAIR) tablet 10 mg (has no administration in time range)  tamsulosin (FLOMAX) capsule 0.8 mg (has no administration in time range)  amLODipine (NORVASC) tablet 5 mg (5 mg Oral Given 02/02/20 1734)  hydrALAZINE (APRESOLINE) injection 20 mg (20 mg Intravenous Given 02/02/20 1908)    ED Course  I have reviewed the triage vital signs and the nursing notes.  Pertinent labs & imaging results that were available during my care of the patient were reviewed by me and considered in my medical decision making (see chart for details).    MDM Rules/Calculators/A&P                      Patient presenting for evaluation after questionable syncopal versus near syncopal episode versus possible seizure-like activity. He is afebrile, persistently hypertensive in the ED. Wife states that he is not compliant with all of his medications. We will give home hypertension medicines and reassess. He denies headache, chest pain, shortness of breath at this time. No focal neurologic deficits on examination today.   Chest x-ray obtained in the ED shows no evidence of acute  cardiopulmonary abnormalities. Head CT shows no evidence of acute intracranial abnormalities including CVA or intracranial hemorrhage. EKG shows normal sinus rhythm with prolonged QT interval and left atrial enlargement. Initial troponin is mildly elevated. We will continue to trend this to establish significance as the patient is currently not complaining of any chest pain. If troponins are uptrending I would be concerned that the patient's near syncopal episode with nausea, vomiting, and diaphoresis could have been cardiac in etiology.  Remainder of lab work reviewed and interpreted by myself shows no leukocytosis, anemia with hemoglobin of 9.9. He has renal insufficiency with creatinine of 3.29, BUN 31 which is up from creatinine of 2 and BUN 26 from  lab work obtained in 2019. I unfortunately am unable to access more recent lab work done at the [place]. With uncontrolled hypertension and renal insufficiency I am concerned for hypertensive emergency. We will give IV hydralazine as the patient was given his home medicines in the ED with no improvement in his blood pressure.  Spoke with Dr. Posey Pronto with [facility] hospitalist service who agrees to assume care of patient and bring him to the hospital for further evaluation and management at this time. Patient was seen and evaluated by Dr. Regenia Skeeter who agrees with assessment and plan at this time.   Final Clinical Impression(s) / ED Diagnoses Final diagnoses:  Hypertensive emergency  AKI (acute kidney injury) [facility])    Rx / [place] Orders ED Discharge Orders    None       Debroah Baller 02/02/20 2238    Sherwood Gambler, MD 02/02/20 [phone]

## 2020-02-02 NOTE — ED Notes (Signed)
Attempted report x1. 

## 2020-02-02 NOTE — H&P (Addendum)
History and Physical    Cody Silva QPY:[id number] DOB: October 10, 1945 DOA: 02/02/2020  PCP: [facility]  Patient coming from: Home  I have personally briefly reviewed patient's old medical records in [facility]  Chief Complaint: Near syncope  HPI: Cody Silva is a 74 y.o. male with medical history significant for history of multiple CVAs, CKD stage III-IV, type 2 diabetes, hypertension, hyperlipidemia, blindness in the left eye, and OSA on CPAP who presents to the ED for evaluation after a near syncopal episode.  Patient states he was in his usual state of health until earlier today (02/02/2020).  He was sitting outside on the porch and says it was hot outside.  He began to feel unwell with generalized weakness and lightheadedness described as a off-balance sensation rather than room spinning.  He got up and began to walk into the house.  He became very diaphoretic and family who are present so that he looked as if he was about to pass out and fall.  They were able to catch him and gently guided down to the floor.  Patient states that he nearly passed out but does not think he lost consciousness.  He says he did become nauseous and then vomited.  He was reportedly confused and not speaking fluently.  It was about 4-5 minutes before he appeared to be returned to his normal self.  At time of admission, patient is now awake, alert, and completely oriented.  He says he did not have any associated chest pain, palpitations, dyspnea, subjective fevers, chills, cough, abdominal pain, diarrhea, or dysuria.  He says he has some slight right-sided weakness from previous strokes and usually ambulates with the use of a cane.  He denies any new or worsening weakness in any of his extremities or change in sensation.  He denies any prior history of seizures.  He says he has had a recent change in one of his medications but is not sure.  He receives his primary care through the [place].  He does  say that he has been only taking his nighttime medications and not his morning meds.  ED Course:  Initial vitals showed BP 193/95, pulse 86, RR 27, temp 98.0 Fahrenheit, SPO2 96% on room air.  Orthostatic vital signs were negative.  Labs are notable for sodium 139, potassium 4.9, bicarb 24, BUN 31, creatinine 3.29 (last previous available 2.00 on 12/07/2017), serum glucose 118, WBC 4.1, hemoglobin 9.9, platelets 154,000, high-sensitivity troponin I 44 >> 40.  SARS-CoV-2 PCR was ordered and pending.  Urinalysis was negative for UTI.  CT head without contrast was negative for acute intracranial abnormality.  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  2 view chest x-ray is negative for focal consolidation, edema, or effusion.  Patient was given amlodipine 5 mg orally, Coreg 25 mg orally, and IV hydralazine 20 mg once.  The hospitalist service was consulted to admit for further evaluation and management.  Review of Systems: All systems reviewed and are negative except as documented in history of present illness above.   Past Medical History:  Diagnosis Date   Anemia    Arthritis    "right arm, right ankle, right side" (10/30/2014)   Colon polyps    Diabetic neuropathy ([place])    Diabetic retinopathy ([place])    H/O agent Orange exposure    Headache    "@ least 3 times/wk" (10/30/2014)   History of blood  transfusion 10/30/2014   hematochezia   History of gout    Hypertension    OSA on CPAP    "suppose to wear mask; I've got a call in for an equipment change" (10/30/2014)   PTSD (post-traumatic stress disorder)    "service related"   Seizures ([place])    Stroke ([place]) 2014   left extremity deficits; facial left   Type II diabetes mellitus ([place])     Past Surgical History:  Procedure Laterality Date   BONE GRAFT HIP ILIAC CREST Left ~ [place],  BILATERAL Bilateral 2014-2015   EP IMPLANTABLE DEVICE N/A 12/24/2015   Procedure: Loop Recorder Insertion;  Surgeon: Thompson Grayer, MD;  Location: [facility];  Service: Cardiovascular;  Laterality: N/A;   TEE WITHOUT CARDIOVERSION N/A 12/07/2017   Procedure: TRANSESOPHAGEAL ECHOCARDIOGRAM (TEE);  Surgeon: Fay Records, MD;  Location[facility] ENDOSCOPY;  Service: Cardiovascular;  Laterality: N/A;   TUMOR REMOVAL Right ~ 1976   "arm; had to take bone left hip to add to the repair"    Social History:  reports that he has never smoked. He has never used smokeless tobacco. He reports current alcohol use of about 1.0 standard drinks of alcohol per week. He reports that he does not use drugs.  Allergies  Allergen Reactions   Metformin And Related Diarrhea   Milk-Related Compounds Diarrhea   Tape Rash    Family History  Problem Relation Age of Onset   Diabetes Mother    Breast cancer Mother    Heart attack Mother        CABG - Age 31   Kidney disease Mother    Stroke Brother    Lung disease Father    Neurofibromatosis Maternal Uncle    Throat cancer Brother    Hypertension Brother      Prior to Admission medications   Medication Sig Start Date End Date Taking? Authorizing Provider  acetaminophen (TYLENOL) 500 MG tablet Take 1 tablet (500 mg total) by mouth every 6 (six) hours as needed for mild pain, moderate pain, fever or headache. 11/01/14   Juluis Mire, MD  albuterol (PROVENTIL HFA;VENTOLIN HFA) 108 (90 Base) MCG/ACT inhaler Inhale 1 puff into the lungs every 6 (six) hours as needed for wheezing or shortness of breath.    [provider]  amLODipine (NORVASC) 5 MG tablet Take 1 tablet (5 mg total) by mouth daily. 12/08/17   Katherine Roan, MD  ammonium lactate (LAC-HYDRIN) 12 % lotion Apply 1 application topically as needed for dry skin.    [provider]  atorvastatin (LIPITOR) 20 MG tablet Take 2 tablets (40 mg total) by mouth daily at 6  PM. 11/18/17   Alphonzo Grieve, MD  budesonide-formoterol (SYMBICORT) 80-4.5 MCG/ACT inhaler Inhale 2 puffs into the lungs 2 (two) times daily.    [provider]  buPROPion (WELLBUTRIN) 75 MG tablet Take 75 mg by mouth daily.    [provider]  Carboxymethylcellulose Sod PF 0.25 % SOLN Place 1 drop into both eyes 4 (four) times daily.    [provider]  carvedilol (COREG) 25 MG tablet Take 25 mg by mouth 2 (two) times daily with a meal.    [provider]  cetirizine (ZYRTEC) 10 MG tablet Take 10 mg by mouth at bedtime as needed for allergies.    [provider]  chlorproMAZINE (THORAZINE) 10 MG tablet Take 1 tablet (10 mg total) by mouth 3 (three)  times daily as needed for hiccoughs. 11/18/17   Alphonzo Grieve, MD  Cholecalciferol (VITAMIN D3) 2000 units TABS Take 2,000 Units by mouth daily.    [provider]  cloNIDine (CATAPRES - DOSED IN MG/24 HR) 0.3 mg/24hr patch Replace old patch with new patch every wednesday 12/07/17   Katherine Roan, MD  clopidogrel (PLAVIX) 75 MG tablet Take 1 tablet (75 mg total) by mouth at bedtime. Patient taking differently: Take 75 mg by mouth daily.  11/08/14   Juluis Mire, MD  dorzolamide-timolol (COSOPT) 22.3-6.8 MG/ML ophthalmic solution Place 1 drop into both eyes as needed (as directed).     [provider]  feeding supplement, ENSURE ENLIVE, (ENSURE ENLIVE) LIQD Take 237 mLs by mouth 2 (two) times daily after a meal. 09/12/16   Asencion Partridge, MD  fluticasone [facility]) 50 MCG/ACT nasal spray Place 1 spray into both nostrils daily.    [provider]  gabapentin (NEURONTIN) 300 MG capsule Take 300 mg by mouth at bedtime.     [provider]  insulin aspart (NOVOLOG) 100 UNIT/ML injection Inject 0-9 Units into the skin 3 (three) times daily with meals. CBG 121-150=1 unit, CBG 151-200=2 units, CBG 201-250=3 units; CBG 251-300=5 units, CBG 301-350=7 units, CBG 351-400=9  units Patient taking differently: Inject 0-9 Units into the skin See admin instructions. Inject 0-9 units into the skin three times a day with meals, per sliding scale: BGL: <70 = call MD; 121-150 = 1 unit; 151-200 = 2 units; 201-250 = 3 units; 251-300 = 5 units; 301-350 = 7 units; 351-400 = 9 units 11/18/17   Alphonzo Grieve, MD  ketotifen (ZADITOR) 0.025 % ophthalmic solution Place 1 drop into both eyes daily as needed (for itching or irritation).     [provider]  latanoprost (XALATAN) 0.005 % ophthalmic solution Place 1 drop into both eyes at bedtime.     [provider]  losartan (COZAAR) 100 MG tablet Take 100 mg by mouth daily.    [provider]  Melatonin 3 MG TABS Take 3 mg by mouth at bedtime.    [provider]  montelukast (SINGULAIR) 10 MG tablet Take 10 mg by mouth at bedtime.    [provider]  sertraline (ZOLOFT) 100 MG tablet Take 100 mg by mouth daily.    [provider]  silver sulfADIAZINE (SILVADENE) 1 % cream Apply topically daily. 12/08/17   Katherine Roan, MD  tamsulosin (FLOMAX) 0.4 MG CAPS capsule Take 0.8 mg by mouth at bedtime.    [provider]  terbinafine (LAMISIL) 1 % cream Apply 1 application topically 2 (two) times daily.    [provider]  tolnaftate (TINACTIN) 1 % powder Apply 1 application topically 2 (two) times daily.    [provider]    Physical Exam: Vitals:   02/02/20 2115 02/02/20 2130 02/02/20 2138 02/02/20 2200  BP: (!) 180/88 (!) 178/88  (!) 159/119  Pulse: 89 88  91  Resp: 17 10  18   Temp:   98.4 F (36.9 C) 98.1 F (36.7 C)  TempSrc:    Oral  SpO2: 100% 100%  100%   Constitutional: Resting in bed with head elevated, NAD, calm, comfortable Eyes: Blindness left eye with nonreactive pupil, chronic per patient.  Right pupil round and reactive to light. lids and conjunctivae normal ENMT: Mucous membranes are moist. Posterior pharynx clear of any exudate or  lesions.Normal dentition.  Neck: normal, supple, no masses. Respiratory: clear to auscultation bilaterally, no  wheezing, no crackles. Normal respiratory effort. No accessory muscle use.  Cardiovascular: Regular rate and rhythm, soft systolic murmur present. No extremity edema. 2+ pedal pulses. Abdomen: no tenderness, no masses palpated. No hepatosplenomegaly. Bowel sounds positive.  Musculoskeletal: no clubbing / cyanosis. No joint deformity upper and lower extremities. Good ROM, no contractures. Normal muscle tone.  Skin: no rashes, lesions, ulcers. No induration Neurologic: Slight right-sided facial droop otherwise CN 2-12 grossly intact. Sensation intact, Strength 5/5 in all 4.  Psychiatric: Normal judgment and insight. Alert and oriented x 3. Normal mood.    Labs on Admission: I have personally reviewed following labs and imaging studies  CBC: Recent Labs  Lab 02/02/20 1721  WBC 4.1  NEUTROABS 3.1  HGB 9.9*  HCT 30.3*  MCV 92.4  PLT 856   Basic Metabolic Panel: Recent Labs  Lab 02/02/20 1721  NA 139  K 4.0  CL 107  CO2 24  GLUCOSE 118*  BUN 31*  CREATININE 3.29*  CALCIUM 8.9   GFR: CrCl cannot be calculated (Unknown ideal weight.). Liver Function Tests: No results for input(s): AST, ALT, ALKPHOS, BILITOT, PROT, ALBUMIN in the last 168 hours. No results for input(s): LIPASE, AMYLASE in the last 168 hours. No results for input(s): AMMONIA in the last 168 hours. Coagulation Profile: No results for input(s): INR, PROTIME in the last 168 hours. Cardiac Enzymes: No results for input(s): CKTOTAL, CKMB, CKMBINDEX, TROPONINI in the last 168 hours. BNP (last 3 results) No results for input(s): PROBNP in the last 8760 hours. HbA1C: No results for input(s): HGBA1C in the last 72 hours. CBG: Recent Labs  Lab 02/02/20 1720  GLUCAP 108*   Lipid Profile: No results for input(s): CHOL, HDL, LDLCALC, TRIG, CHOLHDL, LDLDIRECT in the last 72 hours. Thyroid Function  Tests: No results for input(s): TSH, T4TOTAL, FREET4, T3FREE, THYROIDAB in the last 72 hours. Anemia Panel: No results for input(s): VITAMINB12, FOLATE, FERRITIN, TIBC, IRON, RETICCTPCT in the last 72 hours. Urine analysis:    Component Value Date/Time   COLORURINE YELLOW 02/02/2020 1732   APPEARANCEUR CLEAR 02/02/2020 1732   LABSPEC 1.012 02/02/2020 1732   PHURINE 5.0 02/02/2020 1732   GLUCOSEU 50 (A) 02/02/2020 1732   HGBUR MODERATE (A) 02/02/2020 1732   BILIRUBINUR NEGATIVE 02/02/2020 1732   KETONESUR NEGATIVE 02/02/2020 1732   PROTEINUR >=300 (A) 02/02/2020 1732   UROBILINOGEN 1.0 11/02/2014 2301   NITRITE NEGATIVE 02/02/2020 1732   LEUKOCYTESUR NEGATIVE 02/02/2020 1732    Radiological Exams on Admission: DG Chest 2 View  Result Date: 02/02/2020 CLINICAL DATA:  Weakness EXAM: CHEST - 2 VIEW COMPARISON:  None. FINDINGS: Cardiomegaly. The hila, mediastinum, lungs, and pleura are otherwise normal. IMPRESSION: No active cardiopulmonary disease. Electronically Signed   By: Dorise Bullion III M.D   On: 02/02/2020 16:39   CT Head Wo Contrast  Result Date: 02/02/2020 CLINICAL DATA:  Syncopal episode today. Dizziness and diaphoresis. Hypertension. EXAM: CT HEAD WITHOUT CONTRAST TECHNIQUE: Contiguous axial images were obtained from the base of the skull through the vertex without intravenous contrast. COMPARISON:  11/14/2017 FINDINGS: Brain: No evidence of acute infarction, hemorrhage, hydrocephalus, or mass lesion/mass effect. Mild generalized cerebral atrophy and moderate chronic small vessel disease are stable in appearance. An old small low-attenuation left parietal subdural hygroma measures approximately 8 mm in thickness and is stable since previous study. Vascular:  No hyperdense vessel or other acute findings. Skull: No evidence of fracture or other significant bone abnormality. Sinuses/Orbits:  No acute findings. Other: None. IMPRESSION: 1. No  acute intracranial abnormality. 2. Stable  cerebral atrophy and chronic small vessel disease. 3. 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 Electronically Signed   By: Marlaine Hind M.D.   On: 02/02/2020 16:36    EKG: Independently reviewed.  Normal sinus rhythm, LAE, QTC 502.  QTC longer when compared to prior.  Assessment/Plan Principal Problem:   Near syncope Active Problems:   Hypertension associated with diabetes ([place])   History of CVA (cerebrovascular accident)   Acute renal failure superimposed on stage 3b chronic kidney disease (HCC)   Type 2 diabetes with nephropathy (HCC)   OSA on CPAP   Hyperlipidemia associated with type 2 diabetes mellitus ([facility])  RIDGELY ANASTACIO is a 74 y.o. male with medical history significant for history of multiple CVAs, CKD stage III-IV, type 2 diabetes, hypertension, hyperlipidemia, blindness in the left eye, and OSA on CPAP who is admitted after near syncopal episode.  Near-syncope: Suspect vasovagal syncope in setting of heat exhaustion.  Currently has no focal deficits.  He has blindness and nonreactive left pupil which is reportedly chronic.  CT head is negative for acute changes, stable cerebral atrophy and chronic small vessel disease are seen.  EKG without acute ischemic changes.  High-sensitivity troponin is minimally elevated at 44 but flat and downtrending with repeat 40.  Orthostatic vital signs are negative. -Monitor on telemetry -Start IV fluid hydration overnight -Obtain echocardiogram -PT/OT eval  Uncontrolled hypertension: BP up to 223/112 in the ED.  He reports mild frontal headache.  CT head without acute changes.  BP improving after receiving IV hydralazine. -Resume home amlodipine, chlorthalidone, Coreg, and clonidine patch -Hold losartan given renal function  Presumed AKI on CKD stage III-IV: Creatinine 3.29 on admission, no recent labs for comparison (last available 2.00 on 12/07/2017). -Continue IV fluid hydration overnight and repeat labs in the  morning  Type 2 diabetes: Patient states he is no longer required medical management since his last A1c was 5.8%.  Continue to monitor.  History of multiple CVAs: With reported mild residual right-sided weakness.  No acute neuro deficits on admission.   -Continue Plavix and atorvastatin  Hyperlipidemia: -Continue atorvastatin  Depression/PTSD: -Continue Lexapro and Wellbutrin   OSA: Patient reports using CPAP intermittently at home, will continue CPAP nightly while in hospital.  DVT prophylaxis: Subcutaneous heparin Code Status: Full code, confirmed with patient Family Communication: Discussed with patient, he has discussed with family Disposition Plan: From home and likely discharge to home pending symptomatic improvement and PT eval Consults called: None Admission status:  Status is: Observation  The patient remains OBS appropriate and will d/c before 2 midnights.  Dispo: The patient is from: Home              Anticipated d/c is to: Home              Anticipated d/c date is: 1 day pending symptomatic improvement, PT eval, and stability of renal function.              Patient currently is not medically stable to d/c.   Zada Finders MD [facility]  If 7PM-7AM, please contact night-coverage [email or web address]  02/02/2020, 10:09 PM

## 2020-02-03 ENCOUNTER — Observation Stay (HOSPITAL_COMMUNITY): Payer: Medicare PPO

## 2020-02-03 DIAGNOSIS — I340 Nonrheumatic mitral (valve) insufficiency: Secondary | ICD-10-CM

## 2020-02-03 DIAGNOSIS — E1122 Type 2 diabetes mellitus with diabetic chronic kidney disease: Secondary | ICD-10-CM | POA: Diagnosis present

## 2020-02-03 DIAGNOSIS — Z8249 Family history of ischemic heart disease and other diseases of the circulatory system: Secondary | ICD-10-CM | POA: Diagnosis not present

## 2020-02-03 DIAGNOSIS — I712 Thoracic aortic aneurysm, without rupture: Secondary | ICD-10-CM | POA: Diagnosis present

## 2020-02-03 DIAGNOSIS — E1140 Type 2 diabetes mellitus with diabetic neuropathy, unspecified: Secondary | ICD-10-CM | POA: Diagnosis present

## 2020-02-03 DIAGNOSIS — I152 Hypertension secondary to endocrine disorders: Secondary | ICD-10-CM | POA: Diagnosis present

## 2020-02-03 DIAGNOSIS — G4733 Obstructive sleep apnea (adult) (pediatric): Secondary | ICD-10-CM | POA: Diagnosis present

## 2020-02-03 DIAGNOSIS — Z794 Long term (current) use of insulin: Secondary | ICD-10-CM | POA: Diagnosis not present

## 2020-02-03 DIAGNOSIS — N179 Acute kidney failure, unspecified: Secondary | ICD-10-CM | POA: Diagnosis present

## 2020-02-03 DIAGNOSIS — Z20822 Contact with and (suspected) exposure to covid-19: Secondary | ICD-10-CM | POA: Diagnosis present

## 2020-02-03 DIAGNOSIS — N1832 Chronic kidney disease, stage 3b: Secondary | ICD-10-CM | POA: Diagnosis present

## 2020-02-03 DIAGNOSIS — E11319 Type 2 diabetes mellitus with unspecified diabetic retinopathy without macular edema: Secondary | ICD-10-CM | POA: Diagnosis present

## 2020-02-03 DIAGNOSIS — I351 Nonrheumatic aortic (valve) insufficiency: Secondary | ICD-10-CM

## 2020-02-03 DIAGNOSIS — Z841 Family history of disorders of kidney and ureter: Secondary | ICD-10-CM | POA: Diagnosis not present

## 2020-02-03 DIAGNOSIS — Z888 Allergy status to other drugs, medicaments and biological substances status: Secondary | ICD-10-CM | POA: Diagnosis not present

## 2020-02-03 DIAGNOSIS — E1169 Type 2 diabetes mellitus with other specified complication: Secondary | ICD-10-CM | POA: Diagnosis present

## 2020-02-03 DIAGNOSIS — F4310 Post-traumatic stress disorder, unspecified: Secondary | ICD-10-CM | POA: Diagnosis present

## 2020-02-03 DIAGNOSIS — R55 Syncope and collapse: Secondary | ICD-10-CM | POA: Diagnosis not present

## 2020-02-03 DIAGNOSIS — I4581 Long QT syndrome: Secondary | ICD-10-CM | POA: Diagnosis present

## 2020-02-03 DIAGNOSIS — F329 Major depressive disorder, single episode, unspecified: Secondary | ICD-10-CM | POA: Diagnosis present

## 2020-02-03 DIAGNOSIS — Z7902 Long term (current) use of antithrombotics/antiplatelets: Secondary | ICD-10-CM | POA: Diagnosis not present

## 2020-02-03 DIAGNOSIS — Z823 Family history of stroke: Secondary | ICD-10-CM | POA: Diagnosis not present

## 2020-02-03 DIAGNOSIS — E785 Hyperlipidemia, unspecified: Secondary | ICD-10-CM | POA: Diagnosis present

## 2020-02-03 DIAGNOSIS — I69351 Hemiplegia and hemiparesis following cerebral infarction affecting right dominant side: Secondary | ICD-10-CM | POA: Diagnosis not present

## 2020-02-03 DIAGNOSIS — G9608 Other cranial cerebrospinal fluid leak: Secondary | ICD-10-CM | POA: Diagnosis present

## 2020-02-03 DIAGNOSIS — I951 Orthostatic hypotension: Secondary | ICD-10-CM | POA: Diagnosis present

## 2020-02-03 DIAGNOSIS — I160 Hypertensive urgency: Secondary | ICD-10-CM | POA: Diagnosis present

## 2020-02-03 LAB — ECHOCARDIOGRAM COMPLETE
Height: 76 in
Weight: 2938.29 oz

## 2020-02-03 LAB — CBC
HCT: 29.1 % — ABNORMAL LOW (ref 39.0–52.0)
Hemoglobin: 9.6 g/dL — ABNORMAL LOW (ref 13.0–17.0)
MCH: 29.9 pg (ref 26.0–34.0)
MCHC: 33 g/dL (ref 30.0–36.0)
MCV: 90.7 fL (ref 80.0–100.0)
Platelets: 151 10*3/uL (ref 150–400)
RBC: 3.21 MIL/uL — ABNORMAL LOW (ref 4.22–5.81)
RDW: 13.2 % (ref 11.5–15.5)
WBC: 5.7 10*3/uL (ref 4.0–10.5)
nRBC: 0 % (ref 0.0–0.2)

## 2020-02-03 LAB — BASIC METABOLIC PANEL
Anion gap: 10 (ref 5–15)
BUN: 32 mg/dL — ABNORMAL HIGH (ref 8–23)
CO2: 22 mmol/L (ref 22–32)
Calcium: 8.8 mg/dL — ABNORMAL LOW (ref 8.9–10.3)
Chloride: 107 mmol/L (ref 98–111)
Creatinine, Ser: 3.26 mg/dL — ABNORMAL HIGH (ref 0.61–1.24)
GFR calc Af Amer: 21 mL/min — ABNORMAL LOW (ref >60)
GFR calc non Af Amer: 18 mL/min — ABNORMAL LOW (ref >60)
Glucose, Bld: 146 mg/dL — ABNORMAL HIGH (ref 70–99)
Potassium: 3.3 mmol/L — ABNORMAL LOW (ref 3.5–5.1)
Sodium: 139 mmol/L (ref 135–145)

## 2020-02-03 LAB — TSH: TSH: 1.214 u[IU]/mL (ref 0.350–4.500)

## 2020-02-03 MED ORDER — POTASSIUM CHLORIDE 10 MEQ/100ML IV SOLN
INTRAVENOUS | Status: AC
  Administered 2020-02-03: 10 meq via INTRAVENOUS
  Filled 2020-02-03: qty 100

## 2020-02-03 MED ORDER — SODIUM CHLORIDE 0.9 % IV SOLN: INTRAVENOUS | Status: DC

## 2020-02-03 MED ORDER — LATANOPROST 0.005 % OP SOLN
1.0000 [drp] | Freq: Every day | OPHTHALMIC | Status: DC
  Administered 2020-02-03 – 2020-02-06 (×4): 1 [drp] via OPHTHALMIC
  Filled 2020-02-03: qty 2.5

## 2020-02-03 MED ORDER — MAGNESIUM OXIDE 400 (241.3 MG) MG PO TABS
800.0000 mg | ORAL_TABLET | Freq: Once | ORAL | Status: AC
  Administered 2020-02-03: 800 mg via ORAL
  Filled 2020-02-03: qty 2

## 2020-02-03 MED ORDER — LABETALOL HCL 5 MG/ML IV SOLN
20.0000 mg | INTRAVENOUS | Status: DC | PRN
  Administered 2020-02-04 – 2020-02-05 (×2): 20 mg via INTRAVENOUS
  Filled 2020-02-03 (×3): qty 4

## 2020-02-03 MED ORDER — POTASSIUM CHLORIDE CRYS ER 20 MEQ PO TBCR
40.0000 meq | EXTENDED_RELEASE_TABLET | Freq: Once | ORAL | Status: AC
  Administered 2020-02-03: 40 meq via ORAL
  Filled 2020-02-03: qty 2

## 2020-02-03 MED ORDER — HYDRALAZINE HCL 50 MG PO TABS
50.0000 mg | ORAL_TABLET | Freq: Four times a day (QID) | ORAL | Status: DC | PRN
  Administered 2020-02-04 – 2020-02-05 (×3): 50 mg via ORAL
  Filled 2020-02-03 (×3): qty 1

## 2020-02-03 MED ORDER — POTASSIUM CHLORIDE 10 MEQ/100ML IV SOLN
10.0000 meq | INTRAVENOUS | Status: AC
  Administered 2020-02-03: 10 meq via INTRAVENOUS
  Filled 2020-02-03: qty 100

## 2020-02-03 MED ORDER — KETOTIFEN FUMARATE 0.025 % OP SOLN
1.0000 [drp] | Freq: Two times a day (BID) | OPHTHALMIC | Status: DC | PRN
  Filled 2020-02-03: qty 5

## 2020-02-03 NOTE — Progress Notes (Signed)
RT placed pt on CPAP dream station with his home setting of 6 cmH2O w/no oxygen bled in to the system. Pt respiratory status is stable at this time w/no distress noted. RT will continue to monitor.

## 2020-02-03 NOTE — Evaluation (Signed)
Occupational Therapy Evaluation Patient Details Name: Cody Silva MRN: [id number] DOB: [birth year] Today's Date: 02/03/2020    History of Present Illness 74 yo male admitted from home with syncope vs Seizure. Pt experienced vomiting and LOC. pt with acute kidney injury.   PMH DM2 CVA with R residual deficits OSA PTSD Diabetic neuropathy HTN chronic L eye blindness  and near complete blindness R eye, seizures, H/O orange exposure,`   Clinical Impression   PT admitted with syncope. Pt currently with functional limitiations due to the deficits listed below (see OT problem list). Pt currently reports feeling fatigued after basic transfer to bathroom then chair. Pt reports "I am worn out"  Pt will benefit from skilled OT to increase their independence and safety with adls and balance to allow discharge home.     Follow Up Recommendations  No OT follow up    Equipment Recommendations  None recommended by OT    Recommendations for Other Services       Precautions / Restrictions Precautions Precautions: None      Mobility Bed Mobility Overal bed mobility: Modified Independent                Transfers Overall transfer level: Needs assistance   Transfers: Sit to/from Stand Sit to Stand: Supervision         General transfer comment: supervision due to visual deficits and reaching for external supports    Balance Overall balance assessment: Needs assistance         Standing balance support: Single extremity supported;During functional activity Standing balance-Leahy Scale: Fair                             ADL either performed or assessed with clinical judgement   ADL Overall ADL's : Needs assistance/impaired Eating/Feeding: Set up;Sitting Eating/Feeding Details (indicate cue type and reason): pt requires verbal cues to food on tray and orientation. once setup reviewed iwth patient pt with increased indep Grooming: Minimal  assistance;Standing Grooming Details (indicate cue type and reason): pt requires cues for soap to sequence. pt needed cues to position at sink safefly                 Toilet Transfer: Minimal assistance   Toileting- Clothing Manipulation and Hygiene: Supervision/safety       Functional mobility during ADLs: Min guard;Rolling walker General ADL Comments: pt setup in chair to eat breakfast      Vision Baseline Vision/History: Legally blind Patient Visual Report: Other (comment)(reports decrease vision in R eye) Additional Comments: pt reports seeing shadows and can tell when therapist is near. pt reports decr vision and headache currently     Perception Perception Perception Tested?: Yes Perception Deficits: Figure ground;Spatial orientation   Praxis      Pertinent Vitals/Pain Pain Assessment: 0-10 Pain Score: 2  Pain Location: head Pain Descriptors / Indicators: Headache Pain Intervention(s): Monitored during session;Repositioned     Hand Dominance Right   Extremity/Trunk Assessment Upper Extremity Assessment Upper Extremity Assessment: Generalized weakness   Lower Extremity Assessment Lower Extremity Assessment: Generalized weakness   Cervical / Trunk Assessment Cervical / Trunk Assessment: Normal   Communication Communication Communication: No difficulties   Cognition Arousal/Alertness: Awake/alert Behavior During Therapy: WFL for tasks assessed/performed Overall Cognitive Status: No family/caregiver present to determine baseline cognitive functioning  General Comments: pt requires increased cues but some cues were related to visual deficits . pt does not ask for additional help due to visual cues. Pt educated to ask for help when needed from staff as visual deficits warrant the need. pt states "oh okay"    General Comments       Exercises     Shoulder Instructions      Home Living Family/patient  expects to be discharged to:: Private residence Living Arrangements: Spouse/significant other;Children Available Help at Discharge: Family Type of Home: House Home Access: Stairs to enter Technical brewer of Steps: 2 Entrance Stairs-Rails: None Home Layout: Two level;Bed/bath upstairs Alternate Level Stairs-Number of Steps: flight Alternate Level Stairs-Rails: Left Bathroom Shower/Tub: Occupational psychologist: Standard Bathroom Accessibility: Yes   Home Equipment: Environmental consultant - 4 wheels;Cane - single point;Electric scooter          Prior Functioning/Environment Level of Independence: Needs assistance    ADL's / Homemaking Assistance Needed: visual deficits requires some setup            OT Problem List: Decreased strength;Decreased activity tolerance;Impaired balance (sitting and/or standing);Impaired vision/perception      OT Treatment/Interventions: Self-care/ADL training;Energy conservation;DME and/or AE instruction;Manual therapy;Therapeutic activities;Visual/perceptual remediation/compensation;Patient/family education;Balance training    OT Goals(Current goals can be found in the care plan section) Acute Rehab OT Goals Patient Stated Goal: none stated OT Goal Formulation: With patient Time For Goal Achievement: 02/17/20 Potential to Achieve Goals: Good  OT Frequency: Min 2X/week   Barriers to D/C:            Co-evaluation              AM-PAC OT "6 Clicks" Daily Activity     Outcome Measure Help from another person eating meals?: None Help from another person taking care of personal grooming?: A Little Help from another person toileting, which includes using toliet, bedpan, or urinal?: A Little Help from another person bathing (including washing, rinsing, drying)?: A Little Help from another person to put on and taking off regular upper body clothing?: A Little Help from another person to put on and taking off regular lower body clothing?: A  Little 6 Click Score: 19   End of Session Equipment Utilized During Treatment: Rolling walker Nurse Communication: Mobility status;Precautions  Activity Tolerance: Patient tolerated treatment well Patient left: in chair;with call bell/phone within reach  OT Visit Diagnosis: Unsteadiness on feet (R26.81);Muscle weakness (generalized) (M62.81);Low vision, both eyes (H54.2)                Time: 2683-4196 OT Time Calculation (min): 24 min Charges:  OT General Charges $OT Visit: 1 Visit OT Evaluation $OT Eval Moderate Complexity: 1 Mod   Brynn, OTR/L  Acute Rehabilitation Services Pager: [phone] Office: [phone] .   Jeri Modena 02/03/2020, 10:36 AM

## 2020-02-03 NOTE — Progress Notes (Signed)
PROGRESS NOTE    Cody Silva  ZOX:[id number] DOB: 04-30-1946 DOA: 02/02/2020 PCP: [facility]   Brief Narrative:  74 y.o. male with medical history significant for history of multiple CVAs with residual right-sided weakness, CKD stage III-IV, type 2 diabetes, hypertension, hyperlipidemia, blindness in the left eye, and OSA on CPAP who presents to the ED for evaluation after a near syncopal episode.  Also found to have AKI.   Assessment & Plan:   Principal Problem:   Near syncope Active Problems:   Hypertension associated with diabetes ([place])   History of CVA (cerebrovascular accident)   Acute renal failure superimposed on stage 3b chronic kidney disease (HCC)   Type 2 diabetes with nephropathy (HCC)   OSA on CPAP   Hyperlipidemia associated with type 2 diabetes mellitus (HCC)   Near-syncope: -Currently resolved.  Suspect vasovagal in nature.  CT of the head negative.  EKG unremarkable, cardiac enzymes remain flat. -Echocardiogram-pending -PT/OT-pending  Hypertensive urgency, improved Essential hypertension -Resume home amlodipine, chlorthalidone, Coreg, and clonidine patch -Losartan on hold  Acute kidney injury on CKD stage IIIb -Baseline creatinine 2.0 2019, admission creatinine 3.29, still elevated at 3.26.  Continue IV fluids today. -Bladder scan  Diabetes mellitus type 2 Not on any home meds.  Hemoglobin is 5.8  History of multiple CVAs with residual right-sided weakness -Continue Plavix and statin  Hyperlipidemia: -Continue Lipitor  Depression/PTSD: -Continue Lexapro and Wellbutrin  OSA: -Bedtime CPAP.   DVT prophylaxis: Subcu heparin Code Status: Full code Family Communication:    Patient status inpatient  Dispo: The patient is from: Home              Anticipated d/c is to: Home              Anticipated d/c date is: 1 day              Patient currently is not medically stable to d/c.  Creatinine higher better despite of IV  fluid hydration overnight.  We will continue IV fluids today.  In the meantime ongoing evaluation for his presyncope, getting echocardiogram, PT/OT.   Subjective: Patient still has some intermittent dizziness but okay while he is at rest.  Tells me this has been ongoing for the past several weeks but previously did not seek any medical attnetion.   Review of Systems Otherwise negative except as per HPI, including: General = no fevers, chills, dizziness,  fatigue HEENT/EYES = negative for loss of vision, double vision, blurred vision,  sore throa Cardiovascular= negative for chest pain, palpitation Respiratory/lungs= negative for shortness of breath, cough, wheezing; hemoptysis,  Gastrointestinal= negative for nausea, vomiting, abdominal pain Genitourinary= negative for Dysuria MSK = Negative for arthralgia, myalgias Neurology= Negative for headache, numbness, tingling  Psychiatry= Negative for suicidal and homocidal ideation Skin= Negative for Rash   Examination: Constitutional: Not in acute distress Respiratory: Clear to auscultation bilaterally Cardiovascular: Normal sinus rhythm, no rubs Abdomen: Nontender nondistended good bowel sounds Musculoskeletal: No edema noted Skin: No rashes seen Neurologic: CN 2-12 grossly intact.  And nonfocal Psychiatric: Normal judgment and insight. Alert and oriented x 3. Normal mood.   Objective: Vitals:   02/02/20 2130 02/02/20 2138 02/02/20 2200 02/03/20 0055  BP: (!) 178/88  (!) 159/119   Pulse: 88  91   Resp: 10  18   Temp:  98.4 F (36.9 C) 98.1 F (36.7 C)   TempSrc:   Oral   SpO2: 100%  100% 99%  Weight:   83.3 kg  Height:   6\' 4"  (1.93 m)    No intake or output data in the 24 hours ending 02/03/20 0745 Filed Weights   02/02/20 2200  Weight: 83.3 kg     Data Reviewed:   CBC: Recent Labs  Lab 02/02/20 1721 02/03/20 0246  WBC 4.1 5.7  NEUTROABS 3.1  --   HGB 9.9* 9.6*  HCT 30.3* 29.1*  MCV 92.4 90.7  PLT 154 409    Basic Metabolic Panel: Recent Labs  Lab 02/02/20 1721 02/02/20 1727 02/03/20 0246  NA 139  --  139  K 4.0  --  3.3*  CL 107  --  107  CO2 24  --  22  GLUCOSE 118*  --  146*  BUN 31*  --  32*  CREATININE 3.29*  --  3.26*  CALCIUM 8.9  --  8.8*  MG  --  1.5*  --    GFR: Estimated Creatinine Clearance: 24.1 mL/min (A) (by C-G formula based on SCr of 3.26 mg/dL (H)). Liver Function Tests: No results for input(s): AST, ALT, ALKPHOS, BILITOT, PROT, ALBUMIN in the last 168 hours. No results for input(s): LIPASE, AMYLASE in the last 168 hours. No results for input(s): AMMONIA in the last 168 hours. Coagulation Profile: No results for input(s): INR, PROTIME in the last 168 hours. Cardiac Enzymes: No results for input(s): CKTOTAL, CKMB, CKMBINDEX, TROPONINI in the last 168 hours. BNP (last 3 results) No results for input(s): PROBNP in the last 8760 hours. HbA1C: Recent Labs    02/02/20 1727  HGBA1C 5.9*   CBG: Recent Labs  Lab 02/02/20 1720 02/02/20 2211  GLUCAP 108* 109*   Lipid Profile: No results for input(s): CHOL, HDL, LDLCALC, TRIG, CHOLHDL, LDLDIRECT in the last 72 hours. Thyroid Function Tests: No results for input(s): TSH, T4TOTAL, FREET4, T3FREE, THYROIDAB in the last 72 hours. Anemia Panel: No results for input(s): VITAMINB12, FOLATE, FERRITIN, TIBC, IRON, RETICCTPCT in the last 72 hours. Sepsis Labs: No results for input(s): PROCALCITON, LATICACIDVEN in the last 168 hours.  Recent Results (from the past 240 hour(s))  SARS Coronavirus 2 by RT PCR (hospital order, performed in [facility] hospital lab) Nasopharyngeal Nasopharyngeal Swab     Status: None   Collection Time: 02/02/20  8:40 PM   Specimen: Nasopharyngeal Swab  Result Value Ref Range Status   SARS Coronavirus 2 NEGATIVE NEGATIVE Final    Comment: (NOTE) SARS-CoV-2 target nucleic acids are NOT DETECTED. The SARS-CoV-2 RNA is generally detectable in upper and lower respiratory specimens during  the acute phase of infection. The lowest concentration of SARS-CoV-2 viral copies this assay can detect is 250 copies / mL. A negative result does not preclude SARS-CoV-2 infection and should not be used as the sole basis for treatment or other patient management decisions.  A negative result may occur with improper specimen collection / handling, submission of specimen other than nasopharyngeal swab, presence of viral mutation(s) within the areas targeted by this assay, and inadequate number of viral copies (<250 copies / mL). A negative result must be combined with clinical observations, patient history, and epidemiological information. Fact Sheet for Patients:   StrictlyIdeas.no Fact Sheet for Healthcare Providers: BankingDealers.co.za This test is not yet approved or cleared  by the [place] FDA and has been authorized for detection and/or diagnosis of SARS-CoV-2 by FDA under an Emergency Use Authorization (EUA).  This EUA will remain in effect (meaning this test can be used) for the duration of the COVID-19 declaration under Section 564(b)(1)  of the Act, 21 U.S.C. section 360bbb-3(b)(1), unless the authorization is terminated or revoked sooner. Performed at [facility], [place] [address] [place], [place] [address]          Radiology Studies: DG Chest 2 View  Result Date: 02/02/2020 CLINICAL DATA:  Weakness EXAM: CHEST - 2 VIEW COMPARISON:  None. FINDINGS: Cardiomegaly. The hila, mediastinum, lungs, and pleura are otherwise normal. IMPRESSION: No active cardiopulmonary disease. Electronically Signed   By: Dorise Bullion III M.D   On: 02/02/2020 16:39   CT Head Wo Contrast  Result Date: 02/02/2020 CLINICAL DATA:  Syncopal episode today. Dizziness and diaphoresis. Hypertension. EXAM: CT HEAD WITHOUT CONTRAST TECHNIQUE: Contiguous axial images were obtained from the base of the skull through the vertex without intravenous  contrast. COMPARISON:  11/14/2017 FINDINGS: Brain: No evidence of acute infarction, hemorrhage, hydrocephalus, or mass lesion/mass effect. Mild generalized cerebral atrophy and moderate chronic small vessel disease are stable in appearance. An old small low-attenuation left parietal subdural hygroma measures approximately 8 mm in thickness and is stable since previous study. Vascular:  No hyperdense vessel or other acute findings. Skull: No evidence of fracture or other significant bone abnormality. Sinuses/Orbits:  No acute findings. Other: None. IMPRESSION: 1. No acute intracranial abnormality. 2. Stable cerebral atrophy and chronic small vessel disease. 3. 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 Electronically Signed   By: Marlaine Hind M.D.   On: 02/02/2020 16:36        Scheduled Meds: . amLODipine  5 mg Oral Daily  . atorvastatin  40 mg Oral QHS  . buPROPion  75 mg Oral Daily  . carvedilol  25 mg Oral BID WC  . chlorthalidone  25 mg Oral Daily  . [START ON 02/07/2020] cloNIDine  0.3 mg Transdermal Q Sat  . clopidogrel  75 mg Oral Daily  . escitalopram  10 mg Oral QHS  . finasteride  5 mg Oral QHS  . gabapentin  300 mg Oral QHS  . heparin injection (subcutaneous)  5,000 Units Subcutaneous Q8H  . magnesium oxide  800 mg Oral Once  . mometasone-formoterol  2 puff Inhalation BID  . montelukast  10 mg Oral QHS  . potassium chloride  40 mEq Oral Once  . sodium chloride flush  3 mL Intravenous Q12H  . tamsulosin  0.8 mg Oral QHS   Continuous Infusions: . sodium chloride    . potassium chloride       LOS: 0 days   Time spent= 35 mins    Umer Harig Arsenio Loader, MD [facility]  If 7PM-7AM, please contact night-coverage  02/03/2020, 7:45 AM

## 2020-02-03 NOTE — Evaluation (Signed)
Physical Therapy Evaluation Patient Details Name: Cody Silva MRN: [id number] DOB: 08/02/1946 Today's Date: 02/03/2020   History of Present Illness  74 yo male admitted from home with syncope vs Seizure. Pt experienced vomiting and LOC. pt with acute kidney injury.   PMH DM2 CVA with R residual deficits OSA PTSD Diabetic neuropathy HTN chronic L eye blindness  and near complete blindness R eye, seizures, H/O orange exposure,`  Clinical Impression  Patient received in recliner. Agrees to PT assessment. Reports he is doing okay. Requires supervision for sit to stand and hand held assist for ambulation in room x 40'. Steady with mobility but requires assistance due to visual deficits. He will continue to benefit from skilled PT while here to improve strength and functional mobility to return home safely.       Follow Up Recommendations No PT follow up    Equipment Recommendations  None recommended by PT    Recommendations for Other Services       Precautions / Restrictions Precautions Precautions: Fall Restrictions Weight Bearing Restrictions: No      Mobility  Bed Mobility Overal bed mobility: Modified Independent             General bed mobility comments: not assessed, patient in recliner and remained in recliner  Transfers Overall transfer level: Needs assistance   Transfers: Sit to/from Stand Sit to Stand: Supervision         General transfer comment: supervision due to visual deficits and reaching for external supports  Ambulation/Gait Ambulation/Gait assistance: Min guard Gait Distance (Feet): 40 Feet Assistive device: 1 person hand held assist Gait Pattern/deviations: Step-through pattern;Decreased step length - right;Decreased step length - left Gait velocity: decr   General Gait Details: patient steady with ambulation. slightly hesitant due to visual deficits  Stairs            Wheelchair Mobility    Modified Rankin (Stroke Patients  Only)       Balance Overall balance assessment: Needs assistance Sitting-balance support: Feet supported Sitting balance-Leahy Scale: Good     Standing balance support: Single extremity supported;During functional activity Standing balance-Leahy Scale: Fair Standing balance comment: no lob but requires some assistance in unfamiliar environment due to visual deficits.                             Pertinent Vitals/Pain Pain Assessment: No/denies pain Pain Score: 2  Pain Location: head Pain Descriptors / Indicators: Headache Pain Intervention(s): Monitored during session;Repositioned    Home Living Family/patient expects to be discharged to:: Private residence Living Arrangements: Spouse/significant other;Children Available Help at Discharge: Family Type of Home: House Home Access: Stairs to enter Entrance Stairs-Rails: None Entrance Stairs-Number of Steps: 2 Home Layout: Two level;Bed/bath upstairs Home Equipment: Walker - 4 wheels;Cane - single point;Electric scooter      Prior Function Level of Independence: Needs assistance      ADL's / Homemaking Assistance Needed: visual deficits requires some setup  Comments: states he uses cane when outside of the home     Hand Dominance   Dominant Hand: Right    Extremity/Trunk Assessment   Upper Extremity Assessment Upper Extremity Assessment: Defer to OT evaluation    Lower Extremity Assessment Lower Extremity Assessment: Generalized weakness    Cervical / Trunk Assessment Cervical / Trunk Assessment: Normal  Communication   Communication: No difficulties  Cognition Arousal/Alertness: Awake/alert Behavior During Therapy: WFL for tasks assessed/performed Overall Cognitive Status: No family/caregiver  present to determine baseline cognitive functioning                                 General Comments: pt requires increased cues but some cues were related to visual deficits . pt does not  ask for additional help due to visual cues. Pt educated to ask for help when needed from staff as visual deficits warrant the need. pt states "oh okay"       General Comments      Exercises     Assessment/Plan    PT Assessment Patient needs continued PT services  PT Problem List Decreased strength;Decreased mobility;Decreased activity tolerance       PT Treatment Interventions Therapeutic exercise;Gait training;Stair training;Functional mobility training;Therapeutic activities;Patient/family education    PT Goals (Current goals can be found in the Care Plan section)  Acute Rehab PT Goals Patient Stated Goal: to return home PT Goal Formulation: With patient Time For Goal Achievement: 02/17/20 Potential to Achieve Goals: Good    Frequency Min 3X/week   Barriers to discharge        Co-evaluation               AM-PAC PT "6 Clicks" Mobility  Outcome Measure Help needed turning from your back to your side while in a flat bed without using bedrails?: None Help needed moving from lying on your back to sitting on the side of a flat bed without using bedrails?: A Little Help needed moving to and from a bed to a chair (including a wheelchair)?: A Little Help needed standing up from a chair using your arms (e.g., wheelchair or bedside chair)?: A Little Help needed to walk in hospital room?: A Little Help needed climbing 3-5 steps with a railing? : A Little 6 Click Score: 19    End of Session   Activity Tolerance: Patient tolerated treatment well Patient left: in chair;with call bell/phone within reach Nurse Communication: Mobility status PT Visit Diagnosis: Muscle weakness (generalized) (M62.81);Difficulty in walking, not elsewhere classified (R26.2)    Time: 5364-6803 PT Time Calculation (min) (ACUTE ONLY): 12 min   Charges:   PT Evaluation $PT Eval Moderate Complexity: 1 Mod PT Treatments $Gait Training: 8-22 mins        [name], PT,  [facility] 02/03/20,12:21 PM

## 2020-02-03 NOTE — Plan of Care (Signed)

## 2020-02-03 NOTE — Progress Notes (Signed)
  Echocardiogram 2D Echocardiogram has been performed.  Godson Pollan G Phillips G[place]lette 02/03/2020, 9:58 AM

## 2020-02-04 ENCOUNTER — Inpatient Hospital Stay (HOSPITAL_COMMUNITY): Payer: Medicare PPO

## 2020-02-04 DIAGNOSIS — R55 Syncope and collapse: Secondary | ICD-10-CM

## 2020-02-04 LAB — CK: Total CK: 103 U/L (ref 49–397)

## 2020-02-04 LAB — URINALYSIS, ROUTINE W REFLEX MICROSCOPIC
Bilirubin Urine: NEGATIVE
Glucose, UA: NEGATIVE mg/dL
Ketones, ur: NEGATIVE mg/dL
Leukocytes,Ua: NEGATIVE
Nitrite: NEGATIVE
Protein, ur: 100 mg/dL — AB
Specific Gravity, Urine: 1.012 (ref 1.005–1.030)
pH: 5 (ref 5.0–8.0)

## 2020-02-04 LAB — BASIC METABOLIC PANEL
Anion gap: 8 (ref 5–15)
BUN: 38 mg/dL — ABNORMAL HIGH (ref 8–23)
CO2: 22 mmol/L (ref 22–32)
Calcium: 8.9 mg/dL (ref 8.9–10.3)
Chloride: 109 mmol/L (ref 98–111)
Creatinine, Ser: 3.57 mg/dL — ABNORMAL HIGH (ref 0.61–1.24)
GFR calc Af Amer: 19 mL/min — ABNORMAL LOW (ref >60)
GFR calc non Af Amer: 16 mL/min — ABNORMAL LOW (ref >60)
Glucose, Bld: 109 mg/dL — ABNORMAL HIGH (ref 70–99)
Potassium: 3.8 mmol/L (ref 3.5–5.1)
Sodium: 139 mmol/L (ref 135–145)

## 2020-02-04 LAB — CBC
HCT: 27.2 % — ABNORMAL LOW (ref 39.0–52.0)
Hemoglobin: 8.9 g/dL — ABNORMAL LOW (ref 13.0–17.0)
MCH: 30.2 pg (ref 26.0–34.0)
MCHC: 32.7 g/dL (ref 30.0–36.0)
MCV: 92.2 fL (ref 80.0–100.0)
Platelets: 137 10*3/uL — ABNORMAL LOW (ref 150–400)
RBC: 2.95 MIL/uL — ABNORMAL LOW (ref 4.22–5.81)
RDW: 13.4 % (ref 11.5–15.5)
WBC: 3.1 10*3/uL — ABNORMAL LOW (ref 4.0–10.5)
nRBC: 0 % (ref 0.0–0.2)

## 2020-02-04 LAB — MAGNESIUM: Magnesium: 1.6 mg/dL — ABNORMAL LOW (ref 1.7–2.4)

## 2020-02-04 LAB — GLUCOSE, CAPILLARY: Glucose-Capillary: 73 mg/dL (ref 70–99)

## 2020-02-04 MED ORDER — CLONIDINE HCL 0.3 MG/24HR TD PTWK
0.3000 mg | MEDICATED_PATCH | TRANSDERMAL | Status: DC
  Administered 2020-02-04: 0.3 mg via TRANSDERMAL
  Filled 2020-02-04: qty 1

## 2020-02-04 MED ORDER — LACTATED RINGERS IV SOLN: INTRAVENOUS | Status: AC

## 2020-02-04 MED ORDER — MAGNESIUM OXIDE 400 (241.3 MG) MG PO TABS
400.0000 mg | ORAL_TABLET | Freq: Two times a day (BID) | ORAL | Status: AC
  Administered 2020-02-04 – 2020-02-05 (×4): 400 mg via ORAL
  Filled 2020-02-04 (×4): qty 1

## 2020-02-04 NOTE — Progress Notes (Signed)
Occupational Therapy Treatment Patient Details Name: Cody Silva MRN: [id number] DOB: [birth year] Today's Date: 02/04/2020    History of present illness 74 yo male admitted from home with syncope vs Seizure. Pt experienced vomiting and LOC. pt with acute kidney injury.   PMH DM2 CVA with R residual deficits OSA PTSD Diabetic neuropathy HTN chronic L eye blindness  and near complete blindness R eye, seizures, H/O orange exposure,`   OT comments  Pt making steady progress towards OT goals this session. Pt noted to be saturated in bed, but unable to state whether it was from incontinence or spilt water. Session focus on functional mobility with RW, toileting tasks and LB dressing. Overall, pt requires MIN A for functional mobility to manage lines and navigate novel environment with RW. Pt completed toilet transfer with MIN A and min guard for clothing mgmt. MOD A for LB bathing d/t visual deficits to thread BLEs into brief. Agree with DC plan below, will follow acutely per POC.    Follow Up Recommendations  No OT follow up    Equipment Recommendations  None recommended by OT    Recommendations for Other Services      Precautions / Restrictions Precautions Precautions: Fall Restrictions Weight Bearing Restrictions: No       Mobility Bed Mobility Overal bed mobility: Modified Independent             General bed mobility comments: no physical assist needed  Transfers Overall transfer level: Needs assistance Equipment used: Rolling walker (2 wheeled) Transfers: Sit to/from Stand Sit to Stand: Supervision         General transfer comment: supervision for safety with managing RW secondary to visual deficits;noted use of grab bars during sit<>stand from toilet    Balance Overall balance assessment: Needs assistance Sitting-balance support: Feet supported Sitting balance-Leahy Scale: Good     Standing balance support: Single extremity supported;During functional  activity Standing balance-Leahy Scale: Fair                             ADL either performed or assessed with clinical judgement   ADL Overall ADL's : Needs assistance/impaired             Lower Body Bathing: Supervison/ safety;Set up;Sit to/from stand Lower Body Bathing Details (indicate cue type and reason): to wash in between legs after incontinence Upper Body Dressing : Sitting;Minimal assistance Upper Body Dressing Details (indicate cue type and reason): hospital gown Lower Body Dressing: Moderate assistance;Sit to/from stand Lower Body Dressing Details (indicate cue type and reason): to new fresh brief; assist to thread BLEs into brief d/t visual deficits Toilet Transfer: Minimal assistance;Ambulation;RW Toilet Transfer Details (indicate cue type and reason): MIN A to line mgmt and RW mgmt d/t visual deficits Toileting- Water quality scientist and Hygiene: Min guard;Sit to/from stand       Functional mobility during ADLs: Min guard;Rolling walker;Minimal assistance;Cueing for safety General ADL Comments: pt continues to present with visual deficits impacting pts ability to engage in BADLs     Vision Baseline Vision/History: Legally blind Patient Visual Report: Other (comment)(worsenign vision in R eye) Additional Comments: pt reports decreased vision in R eye however compensates well   Perception     Praxis      Cognition Arousal/Alertness: Awake/alert Behavior During Therapy: WFL for tasks assessed/performed Overall Cognitive Status: Within Functional Limits for tasks assessed  General Comments: overall WFL for simple tasks, however some general awareness deficits noted by pt sitting in urine or split water. Pt unable to decide if its from incontinence or spilt water showing no urgency to exit wet bed        Exercises     Shoulder Instructions       General Comments pt noted to be saturated in urine or  split water. pt unable to decide which. assisted pt with LB bathing and donning new brief and gown    Pertinent Vitals/ Pain       Pain Assessment: No/denies pain  Home Living                                          Prior Functioning/Environment              Frequency  Min 2X/week        Progress Toward Goals  OT Goals(current goals can now be found in the care plan section)  Progress towards OT goals: Progressing toward goals  Acute Rehab OT Goals Patient Stated Goal: to return home OT Goal Formulation: With patient Time For Goal Achievement: 02/17/20 Potential to Achieve Goals: Good  Plan Discharge plan remains appropriate    Co-evaluation                 AM-PAC OT "6 Clicks" Daily Activity     Outcome Measure   Help from another person eating meals?: None Help from another person taking care of personal grooming?: A Little Help from another person toileting, which includes using toliet, bedpan, or urinal?: A Little Help from another person bathing (including washing, rinsing, drying)?: A Little Help from another person to put on and taking off regular upper body clothing?: A Little Help from another person to put on and taking off regular lower body clothing?: A Little 6 Click Score: 19    End of Session Equipment Utilized During Treatment: Rolling walker  OT Visit Diagnosis: Unsteadiness on feet (R26.81);Muscle weakness (generalized) (M62.81);Low vision, both eyes (H54.2)   Activity Tolerance Patient tolerated treatment well   Patient Left Other (comment)(handed off to PT from recliner)   Nurse Communication Mobility status;Other (comment)(saturated in bed)        Time: 1219-7588 OT Time Calculation (min): 19 min  Charges: OT General Charges $OT Visit: 1 Visit OT Treatments $Self Care/Home Management : 8-22 mins  Cody Silva., COTA/L Acute Rehabilitation Services [phone]    Cody Silva 02/04/2020, 4:54 PM

## 2020-02-04 NOTE — Progress Notes (Signed)
RT offered CPAP dream station to pt for the night and pt declined stating its just not comfortable. Pt respiratory status stable w/no distress noted. RT will continue to monitor.

## 2020-02-04 NOTE — Progress Notes (Signed)
RT offered to place pt on CPAP dream station for the night and pt declined stating our equipment was not comfortable. Pts respiratory status is stable at this time on RA w/no distress noted. RT will continue to monitor.

## 2020-02-04 NOTE — Progress Notes (Signed)
Physical Therapy Treatment Patient Details Name: Cody Silva MRN: [id number] DOB: 1946/01/19 Today's Date: 02/04/2020    History of Present Illness 74 yo male admitted from home with syncope vs Seizure. Pt experienced vomiting and LOC. pt with acute kidney injury.   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  Pt was found to have drop in BP from sitting to standing that did not resolve after 3 mins.  Notified nursing.  Also, educated pt on safety with orthostatic hypotension (having assist, notifying staff immediately if dizzy, taking time with transfers).  Pt requiring min cues with gait due to poor vision.  Cont POC.   Follow Up Recommendations  No PT follow up     Equipment Recommendations  None recommended by PT    Recommendations for Other Services       Precautions / Restrictions Precautions Precautions: Fall Precaution Comments: orthostatic bp Restrictions Weight Bearing Restrictions: No    Mobility  Bed Mobility Overal bed mobility: Modified Independent             General bed mobility comments: up with OT at arrival  Transfers Overall transfer level: Needs assistance Equipment used: Rolling walker (2 wheeled) Transfers: Sit to/from Stand Sit to Stand: Min guard         General transfer comment: min guard for safety; sit to stand x 2 (once from toilet and once from recliner)  Ambulation/Gait Ambulation/Gait assistance: Min guard Gait Distance (Feet): 100 Feet Assistive device: Rolling walker (2 wheeled) Gait Pattern/deviations: Step-through pattern Gait velocity: decr   General Gait Details: Pt did not want to ambulate in hall, ambulated in circles in room, pt able to see enough to navigate RW with min cues,  reported dizziness in last few feet of ambulation   Stairs             Wheelchair Mobility    Modified Rankin (Stroke Patients Only)       Balance Overall balance assessment: Needs assistance Sitting-balance support: Feet supported Sitting balance-Leahy Scale: Good     Standing balance support: Single extremity supported;During functional activity Standing balance-Leahy Scale: Fair Standing balance comment: no lob but requires some assistance in unfamiliar environment due to visual deficits.                            Cognition Arousal/Alertness: Awake/alert Behavior During Therapy: WFL for tasks assessed/performed Overall Cognitive Status: Within Functional Limits for tasks assessed                                      Exercises      General Comments General comments (skin integrity, edema, etc.): Pt reporting dizziness while walking and needing to use toilet.  Pt positioned on toilet and was noted to be pale and diaphoretic.  Returned with dynamap and BP in sitting158/89 HR 73 bpm.  Pt unable to use bathroom.  Pt stood and BP 126/74 and HR 74.  Returned to recliner , still standing BP after 3 mins 124/70 and HR 73 bpm.  Pt with c/o mild dizziness but did not notice change between sit  to stand.  Notified nursing. Educated pt on safety.      Pertinent Vitals/Pain Pain Assessment: No/denies pain    Home Living                      Prior Function            PT Goals (current goals can now be found in the care plan section) Acute Rehab PT Goals Patient Stated Goal: to return home Progress towards PT goals: Progressing toward goals    Frequency    Min 3X/week      PT Plan Current plan remains appropriate    Co-evaluation              AM-PAC PT "6 Clicks" Mobility   Outcome Measure    Help needed moving from lying on your back to sitting on the side of a flat bed without using bedrails?: A Little Help needed  moving to and from a bed to a chair (including a wheelchair)?: A Little Help needed standing up from a chair using your arms (e.g., wheelchair or bedside chair)?: A Little Help needed to walk in hospital room?: A Little Help needed climbing 3-5 steps with a railing? : A Little 6 Click Score: 15    End of Session Equipment Utilized During Treatment: Gait belt Activity Tolerance: Other (comment)(orthostatic hypotension) Patient left: in chair;with call bell/phone within reach Nurse Communication: Mobility status;Other (comment)(orthostatic hypotension; pt in chair (no alarm in room , nursing reports pt up earlier and calls for assist)) PT Visit Diagnosis: Muscle weakness (generalized) (M62.81);Difficulty in walking, not elsewhere classified (R26.2)     Time: 4580-9983 PT Time Calculation (min) (ACUTE ONLY): 25 min  Charges:  $Gait Training: 8-22 mins $Therapeutic Activity: 8-22 mins                     Maggie Font, PT Acute Rehab Services Pager [phone] [facility] [phone] [facility] [phone]    Cody Silva 02/04/2020, 5:47 PM

## 2020-02-04 NOTE — Plan of Care (Signed)

## 2020-02-04 NOTE — Progress Notes (Addendum)
PROGRESS NOTE    Cody Silva  YBO:[id number] DOB: 04-15-1946 DOA: 02/02/2020 PCP: [facility]   Chief Complaint  Patient presents with  . Near Syncope    Brief Narrative:  74 y.o.malewith medical history significant forhistory of multiple CVAs with residual right-sided weakness, CKD stage III-IV, type 2 diabetes, hypertension, hyperlipidemia, blindness in the left eye, and OSA on CPAPwho presents to the ED for evaluation after anear syncopal episode.  Also found to have AKI.  Assessment & Plan:   Principal Problem:   Near syncope Active Problems:   Hypertension associated with diabetes ([place])   History of CVA (cerebrovascular accident)   Acute renal failure superimposed on stage 3b chronic kidney disease (HCC)   Type 2 diabetes with nephropathy (HCC)   OSA on CPAP   Hyperlipidemia associated with type 2 diabetes mellitus ([place])   AKI (acute kidney injury) ([place])  Near-syncope: -Currently resolved.  Suspect vasovagal in nature.  CT of the head negative.  Negative orthostatics.  EKG unremarkable (prolonged QT - will repeat EKG), cardiac enzymes remain flat. -Echocardiogram- EF 27-78%, grade 1 diastolic dysfunction -PT/OT- without recommendations for follow up - continue telemetry  Acute kidney injury on CKD stage IIIb -Baseline creatinine 2.0 2019, admission creatinine 3.29, worsening to 3.57 today - hold chlorthalidone and losartan - US renal pending - UA, CK pending  - continue IVF  Hypertensive urgency, improved Essential hypertension -Resume home amlodipine,Coreg,and clonidine patch -Losartan, clorthalidone on hold  Abnormal Echo -> ? Artifact, line visualized within lumen of ascending aorta, suggestive of artifact vs possible dissection.  Per cards, "highly suggestive of artifact" will discuss with cards, consider additional imaging.  Diabetes mellitus type 2 Not on any home meds.  Hemoglobin is 5.8  History of multiple CVAs with  residual right-sided weakness -Continue Plavix and statin  Hyperlipidemia: -Continue Lipitor  Depression/PTSD: -Continue Lexapro and Wellbutrin  OSA: -Bedtime CPAP.  DVT prophylaxis: heparin  Code Status: full  Family Communication: none at bedside - called wife, no answer Disposition:   Status is: Inpatient  Remains inpatient appropriate because:Inpatient level of care appropriate due to severity of illness   Dispo: The patient is from: Home              Anticipated d/c is to: Home              Anticipated d/c date is: 2 days              Patient currently is not medically stable to d/c.  Consultants:   none  Procedures:  Echo IMPRESSIONS    1. Left ventricular ejection fraction, by estimation, is 50 to 55%. The  left ventricle has low normal function. The left ventricle has no regional  wall motion abnormalities. There is moderate concentric left ventricular  hypertrophy. Left ventricular  diastolic parameters are consistent with Grade I diastolic dysfunction  (impaired relaxation).  2. Right ventricular systolic function is normal. The right ventricular  size is normal. Tricuspid regurgitation signal is inadequate for assessing  PA pressure.  3. Left atrial size was moderately dilated.  4. Right atrial size was mildly dilated.  5. The mitral valve is normal in structure. Mild mitral valve  regurgitation. No evidence of mitral stenosis.  6. The aortic valve is tricuspid. Aortic valve regurgitation is mild.  Mild aortic valve sclerosis is present, with no evidence of aortic valve  stenosis.  7. There is Byrne Capek line visualized within the lumen of the ascendening aorta.  Given its  location, motion, and visualization in other views, this is  highly suggestive of artifact. However, if there is clinical concern for  dissection, this cannot be determine  given the images, and would recommend dedicated aorta imaging with CT.[name]  Aortic dilatation noted. There is  mild dilatation of the ascending aorta  measuring 40 mm.  8. The inferior vena cava is normal in size with greater than 50%  respiratory variability, suggesting right atrial pressure of 3 mmHg.   Antimicrobials:  Anti-infectives (From admission, onward)   None        Subjective: No complaints today Says sititng in [place], started feeling funny, got hot went in house and fell, didn't pass out, ems was called Feeling better now  Objective: Vitals:   02/04/20 0500 02/04/20 0544 02/04/20 0746 02/04/20 1004  BP:  (!) 185/91 (!) 178/87   Pulse:   64 62  Resp:   16 14  Temp:   98.4 F (36.9 C)   TempSrc:      SpO2:   99% 99%  Weight: 86.9 kg     Height:        Intake/Output Summary (Last 24 hours) at 02/04/2020 1126 Last data filed at 02/03/2020 2300 Gross per 24 hour  Intake 1763.28 ml  Output --  Net 1763.28 ml   Filed Weights   02/02/20 2200 02/04/20 0500  Weight: 83.3 kg 86.9 kg    Examination:  General exam: Appears calm and comfortable  Respiratory system: Clear to auscultation. Respiratory effort normal. Cardiovascular system: S1 & S2 heard, RRR.  Gastrointestinal system: Abdomen is nondistended, soft and nontender. Central nervous system: Alert and oriented. No focal neurological deficits. Extremities: no LEE Skin: No rashes, lesions or ulcers Psychiatry: Judgement and insight appear normal. Mood & affect appropriate.     Data Reviewed: I have personally reviewed following labs and imaging studies  CBC: Recent Labs  Lab 02/02/20 1721 02/03/20 0246 02/04/20 0148  WBC 4.1 5.7 3.1*  NEUTROABS 3.1  --   --   HGB 9.9* 9.6* 8.9*  HCT 30.3* 29.1* 27.2*  MCV 92.4 90.7 92.2  PLT 154 151 137*    Basic Metabolic Panel: Recent Labs  Lab 02/02/20 1721 02/02/20 1727 02/03/20 0246 02/04/20 0148  NA 139  --  139 139  K 4.0  --  3.3* 3.8  CL 107  --  107 109  CO2 24  --  22 22  GLUCOSE 118*  --  146* 109*  BUN 31*  --  32* 38*  CREATININE 3.29*  --   3.26* 3.57*  CALCIUM 8.9  --  8.8* 8.9  MG  --  1.5*  --  1.6*    GFR: Estimated Creatinine Clearance: 23 mL/min (Tekesha Almgren) (by C-G formula based on SCr of 3.57 mg/dL (H)).  Liver Function Tests: No results for input(s): AST, ALT, ALKPHOS, BILITOT, PROT, ALBUMIN in the last 168 hours.  CBG: Recent Labs  Lab 02/02/20 1720 02/02/20 2211 02/04/20 0540  GLUCAP 108* 109* 73     Recent Results (from the past 240 hour(s))  SARS Coronavirus 2 by RT PCR (hospital order, performed in [facility] hospital lab) Nasopharyngeal Nasopharyngeal Swab     Status: None   Collection Time: 02/02/20  8:40 PM   Specimen: Nasopharyngeal Swab  Result Value Ref Range Status   SARS Coronavirus 2 NEGATIVE NEGATIVE Final    Comment: (NOTE) SARS-CoV-2 target nucleic acids are NOT DETECTED. The SARS-CoV-2 RNA is generally detectable in upper and lower respiratory  specimens during the acute phase of infection. The lowest concentration of SARS-CoV-2 viral copies this assay can detect is 250 copies / mL. Nataliee Shurtz negative result does not preclude SARS-CoV-2 infection and should not be used as the sole basis for treatment or other patient management decisions.  Aashi Derrington negative result may occur with improper specimen collection / handling, submission of specimen other than nasopharyngeal swab, presence of viral mutation(s) within the areas targeted by this assay, and inadequate number of viral copies (<250 copies / mL). Uel Davidow negative result must be combined with clinical observations, patient history, and epidemiological information. Fact Sheet for Patients:   StrictlyIdeas.no Fact Sheet for Healthcare Providers: BankingDealers.co.za This test is not yet approved or cleared  by the [place] FDA and has been authorized for detection and/or diagnosis of SARS-CoV-2 by FDA under an Emergency Use Authorization (EUA).  This EUA will remain in effect (meaning this test can be used)  for the duration of the COVID-19 declaration under Section 564(b)(1) of the Act, 21 U.S.C. section 360bbb-3(b)(1), unless the authorization is terminated or revoked sooner. Performed at [facility], [place] [address], [place], [place] [address]          Radiology Studies: DG Chest 2 View  Result Date: 02/02/2020 CLINICAL DATA:  Weakness EXAM: CHEST - 2 VIEW COMPARISON:  None. FINDINGS: Cardiomegaly. The hila, mediastinum, lungs, and pleura are otherwise normal. IMPRESSION: No active cardiopulmonary disease. Electronically Signed   By: Dorise Bullion III M.D   On: 02/02/2020 16:39   CT Head Wo Contrast  Result Date: 02/02/2020 CLINICAL DATA:  Syncopal episode today. Dizziness and diaphoresis. Hypertension. EXAM: CT HEAD WITHOUT CONTRAST TECHNIQUE: Contiguous axial images were obtained from the base of the skull through the vertex without intravenous contrast. COMPARISON:  11/14/2017 FINDINGS: Brain: No evidence of acute infarction, hemorrhage, hydrocephalus, or mass lesion/mass effect. Mild generalized cerebral atrophy and moderate chronic small vessel disease are stable in appearance. An old small low-attenuation left parietal subdural hygroma measures approximately 8 mm in thickness and is stable since previous study. Vascular:  No hyperdense vessel or other acute findings. Skull: No evidence of fracture or other significant bone abnormality. Sinuses/Orbits:  No acute findings. Other: None. IMPRESSION: 1. No acute intracranial abnormality. 2. Stable cerebral atrophy and chronic small vessel disease. 3. 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 Electronically Signed   By: Marlaine Hind M.D.   On: 02/02/2020 16:36   ECHOCARDIOGRAM COMPLETE  Result Date: 02/03/2020    ECHOCARDIOGRAM REPORT   Patient Name:   Cody Silva [facility] Date of Exam: 02/03/2020 Medical Rec #:  [id number]         Height:       76.0 in Accession #:    [id number]        Weight:       183.6 lb Date of Birth:   [birth year]        BSA:          2.136 m Patient Age:    25 years          BP:           159/119 mmHg Patient Gender: M                 HR:           75 bpm. Exam Location:  Inpatient Procedure: 2D Echo, Cardiac Doppler and Color Doppler Indications:    R55 Syncope  History:        Patient  has prior history of Echocardiogram examinations, most                 recent 12/07/2017. Stroke; Risk Factors:Hypertension, Diabetes and                 Sleep Apnea. Seizures.  Sonographer:    Jonelle Sidle Dance Referring Phys: [id number] [place]  1. Left ventricular ejection fraction, by estimation, is 50 to 55%. The left ventricle has low normal function. The left ventricle has no regional wall motion abnormalities. There is moderate concentric left ventricular hypertrophy. Left ventricular diastolic parameters are consistent with Grade I diastolic dysfunction (impaired relaxation).  2. Right ventricular systolic function is normal. The right ventricular size is normal. Tricuspid regurgitation signal is inadequate for assessing PA pressure.  3. Left atrial size was moderately dilated.  4. Right atrial size was mildly dilated.  5. The mitral valve is normal in structure. Mild mitral valve regurgitation. No evidence of mitral stenosis.  6. The aortic valve is tricuspid. Aortic valve regurgitation is mild. Mild aortic valve sclerosis is present, with no evidence of aortic valve stenosis.  7. There is Emmanuela Ghazi line visualized within the lumen of the ascendening aorta. Given its location, motion, and visualization in other views, this is highly suggestive of artifact. However, if there is clinical concern for dissection, this cannot be determine given the images, and would recommend dedicated aorta imaging with CT.[name] Aortic dilatation noted. There is mild dilatation of the ascending aorta measuring 40 mm.  8. The inferior vena cava is normal in size with greater than 50% respiratory variability, suggesting right atrial pressure of  3 mmHg. Comparison(s): No significant change from prior study. Conclusion(s)/Recommendation(s): Line in ascending aorta is suggestive of artifact. If clinical concern for aortic dissection, recommend dedicated imaging. FINDINGS  Left Ventricle: Left ventricular ejection fraction, by estimation, is 50 to 55%. The left ventricle has low normal function. The left ventricle has no regional wall motion abnormalities. The left ventricular internal cavity size was normal in size. There is moderate concentric left ventricular hypertrophy. Left ventricular diastolic parameters are consistent with Grade I diastolic dysfunction (impaired relaxation). Right Ventricle: The right ventricular size is normal. No increase in right ventricular wall thickness. Right ventricular systolic function is normal. Tricuspid regurgitation signal is inadequate for assessing PA pressure. Left Atrium: Left atrial size was moderately dilated. Right Atrium: Right atrial size was mildly dilated. Pericardium: There is no evidence of pericardial effusion. Mitral Valve: The mitral valve is normal in structure. Mild mitral valve regurgitation. No evidence of mitral valve stenosis. Tricuspid Valve: The tricuspid valve is normal in structure. Tricuspid valve regurgitation is trivial. No evidence of tricuspid stenosis. Aortic Valve: The aortic valve is tricuspid. Aortic valve regurgitation is mild. Aortic regurgitation PHT measures 948 msec. Mild aortic valve sclerosis is present, with no evidence of aortic valve stenosis. There is mild calcification of the aortic valve. Pulmonic Valve: The pulmonic valve was not well visualized. Pulmonic valve regurgitation is not visualized. Aorta: There is Jayanna Kroeger line visualized within the lumen of the ascendening aorta. Given its location, motion, and visualization in other views, this is highly suggestive of artifact. However, if there is clinical concern for dissection, this cannot be determine given the images, and would  recommend dedicated aorta imaging with CT. Aortic dilatation noted. There is mild dilatation of the ascending aorta measuring 40 mm. Venous: The inferior vena cava is normal in size with greater than 50% respiratory variability, suggesting right atrial pressure of  3 mmHg. IAS/Shunts: No atrial level shunt detected by color flow Doppler.  LEFT VENTRICLE PLAX 2D LVIDd:         5.10 cm  Diastology LVIDs:         3.50 cm  LV e' lateral:   5.22 cm/s LV PW:         1.50 cm  LV E/e' lateral: 15.7 LV IVS:        1.50 cm  LV e' medial:    4.90 cm/s LVOT diam:     2.20 cm  LV E/e' medial:  16.7 LV SV:         93 LV SV Index:   43 LVOT Area:     3.80 cm  RIGHT VENTRICLE             IVC RV Basal diam:  2.90 cm     IVC diam: 1.50 cm RV S prime:     15.70 cm/s TAPSE (M-mode): 2.8 cm LEFT ATRIUM              Index LA diam:        4.70 cm  2.20 cm/m LA Vol (A2C):   163.0 ml 76.30 ml/m LA Vol (A4C):   85.8 ml  40.16 ml/m LA Biplane Vol: 127.0 ml 59.45 ml/m  AORTIC VALVE LVOT Vmax:   110.00 cm/s LVOT Vmean:  72.800 cm/s LVOT VTI:    0.244 m AI PHT:      948 msec  AORTA Ao Root diam: 4.00 cm Ao Asc diam:  4.00 cm MITRAL VALVE MV Area (PHT): 3.72 cm     SHUNTS MV Decel Time: 204 msec     Systemic VTI:  0.24 m MV E velocity: 82.00 cm/s   Systemic Diam: 2.20 cm MV Azam Gervasi velocity: 129.00 cm/s MV E/Donney Caraveo ratio:  0.64 Buford Dresser MD Electronically signed by Buford Dresser MD Signature Date/Time: 02/03/2020/10:29:25 AM    Final         Scheduled Meds: . amLODipine  5 mg Oral Daily  . atorvastatin  40 mg Oral QHS  . buPROPion  75 mg Oral Daily  . carvedilol  25 mg Oral BID WC  . cloNIDine  0.3 mg Transdermal Q Sat  . clopidogrel  75 mg Oral Daily  . escitalopram  10 mg Oral QHS  . finasteride  5 mg Oral QHS  . gabapentin  300 mg Oral QHS  . heparin injection (subcutaneous)  5,000 Units Subcutaneous Q8H  . latanoprost  1 drop Both Eyes QHS  . mometasone-formoterol  2 puff Inhalation BID  . montelukast  10 mg  Oral QHS  . sodium chloride flush  3 mL Intravenous Q12H  . tamsulosin  0.8 mg Oral QHS   Continuous Infusions: . lactated ringers 75 mL/hr at 02/04/20 0919     LOS: 1 day    Time spent: over 30 min    Fayrene Helper, MD [facility]   To contact the attending provider between 7A-7P or the covering provider during after hours 7P-7A, please log into the web site [email or web address] and access using universal [place] password for that web site. If you do not have the password, please call the hospital operator.  02/04/2020, 11:26 AM

## 2020-02-05 ENCOUNTER — Inpatient Hospital Stay (HOSPITAL_COMMUNITY): Payer: Medicare PPO

## 2020-02-05 LAB — BASIC METABOLIC PANEL
Anion gap: 7 (ref 5–15)
BUN: 35 mg/dL — ABNORMAL HIGH (ref 8–23)
CO2: 22 mmol/L (ref 22–32)
Calcium: 9 mg/dL (ref 8.9–10.3)
Chloride: 110 mmol/L (ref 98–111)
Creatinine, Ser: 3.32 mg/dL — ABNORMAL HIGH (ref 0.61–1.24)
GFR calc Af Amer: 20 mL/min — ABNORMAL LOW (ref >60)
GFR calc non Af Amer: 18 mL/min — ABNORMAL LOW (ref >60)
Glucose, Bld: 97 mg/dL (ref 70–99)
Potassium: 3.7 mmol/L (ref 3.5–5.1)
Sodium: 139 mmol/L (ref 135–145)

## 2020-02-05 LAB — CBC
HCT: 29.4 % — ABNORMAL LOW (ref 39.0–52.0)
Hemoglobin: 9.7 g/dL — ABNORMAL LOW (ref 13.0–17.0)
MCH: 30.1 pg (ref 26.0–34.0)
MCHC: 33 g/dL (ref 30.0–36.0)
MCV: 91.3 fL (ref 80.0–100.0)
Platelets: 145 10*3/uL — ABNORMAL LOW (ref 150–400)
RBC: 3.22 MIL/uL — ABNORMAL LOW (ref 4.22–5.81)
RDW: 13.3 % (ref 11.5–15.5)
WBC: 3.4 10*3/uL — ABNORMAL LOW (ref 4.0–10.5)
nRBC: 0 % (ref 0.0–0.2)

## 2020-02-05 LAB — MAGNESIUM: Magnesium: 1.6 mg/dL — ABNORMAL LOW (ref 1.7–2.4)

## 2020-02-05 LAB — SODIUM, URINE, RANDOM: Sodium, Ur: 105 mmol/L

## 2020-02-05 LAB — CREATININE, URINE, RANDOM: Creatinine, Urine: 105.3 mg/dL

## 2020-02-05 LAB — GLUCOSE, CAPILLARY: Glucose-Capillary: 86 mg/dL (ref 70–99)

## 2020-02-05 MED ORDER — CLONIDINE HCL 0.3 MG/24HR TD PTWK
0.3000 mg | MEDICATED_PATCH | TRANSDERMAL | Status: DC
  Administered 2020-02-05: 0.3 mg via TRANSDERMAL
  Filled 2020-02-05 (×2): qty 1

## 2020-02-05 NOTE — Progress Notes (Addendum)
PROGRESS NOTE    Cody Silva  XVQ:[id number] DOB: May 21, 1946 DOA: 02/02/2020 PCP: [facility]   Chief Complaint  Patient presents with  . Near Syncope    Brief Narrative:  74 y.o.malewith medical history significant forhistory of multiple CVAs with residual right-sided weakness, CKD stage III-IV, type 2 diabetes, hypertension, hyperlipidemia, blindness in the left eye, and OSA on CPAPwho presents to the ED for evaluation after anear syncopal episode.  Also found to have AKI.  Assessment & Plan:   Principal Problem:   Near syncope Active Problems:   Hypertension associated with diabetes ([place])   History of CVA (cerebrovascular accident)   Acute renal failure superimposed on stage 3b chronic kidney disease (HCC)   Type 2 diabetes with nephropathy (HCC)   OSA on CPAP   Hyperlipidemia associated with type 2 diabetes mellitus ([place])   AKI (acute kidney injury) ([place])  Near-syncope: -Currently resolved.  Suspect vasovagal in nature.  CT of the head negative.  Negative orthostatics.  EKG unremarkable (prolonged QT - will repeat EKG), cardiac enzymes remain flat. -Echocardiogram- EF 09-32%, grade 1 diastolic dysfunction -> follow up abnormal echo as below -PT/OT- without recommendations for follow up - continue telemetry  Abnormal Echo -> ? Artifact, line visualized within lumen of ascending aorta, suggestive of artifact vs possible dissection.  Per cards, "highly suggestive of artifact", but if clinical concern for dissection, "this cannot be determined given the images".  Will plan to follow up with MRI without contrast in setting of his renal function and presentation with presyncope and hypertension.   Acute kidney injury on CKD stage IIIb -Baseline creatinine 2.0 2019, admission creatinine 3.29, peaked to 3.57 -> improving to 3.32  - hold chlorthalidone and losartan - US renal negative for hydro - UA -> notable for proteinuria, 0-5 RBC's (follow protein  outpatient), CK wnl - continue IVF  Hypertensive urgency, improved Essential hypertension -Resume home amlodipine,Coreg,and clonidine patch -Losartan, clorthalidone on hold  Diabetes mellitus type 2 Not on any home meds.  Hemoglobin is 5.8  History of multiple CVAs with residual right-sided weakness -Continue Plavix and statin  Hyperlipidemia: -Continue Lipitor  Depression/PTSD: -Continue Lexapro and Wellbutrin  OSA: -Bedtime CPAP.  DVT prophylaxis: heparin  Code Status: full  Family Communication: none at bedside - called wife, no answer 6/3 Disposition:   Status is: Inpatient  Remains inpatient appropriate because:Inpatient level of care appropriate due to severity of illness   Dispo: The patient is from: Home              Anticipated d/c is to: Home              Anticipated d/c date is: 2 days              Patient currently is not medically stable to d/c.  Consultants:   none  Procedures:  Echo IMPRESSIONS    1. Left ventricular ejection fraction, by estimation, is 50 to 55%. The  left ventricle has low normal function. The left ventricle has no regional  wall motion abnormalities. There is moderate concentric left ventricular  hypertrophy. Left ventricular  diastolic parameters are consistent with Grade I diastolic dysfunction  (impaired relaxation).  2. Right ventricular systolic function is normal. The right ventricular  size is normal. Tricuspid regurgitation signal is inadequate for assessing  PA pressure.  3. Left atrial size was moderately dilated.  4. Right atrial size was mildly dilated.  5. The mitral valve is normal in structure. Mild mitral valve  regurgitation. No evidence of mitral stenosis.  6. The aortic valve is tricuspid. Aortic valve regurgitation is mild.  Mild aortic valve sclerosis is present, with no evidence of aortic valve  stenosis.  7. There is Harm Jou line visualized within the lumen of the ascendening aorta.   Given its location, motion, and visualization in other views, this is  highly suggestive of artifact. However, if there is clinical concern for  dissection, this cannot be determine  given the images, and would recommend dedicated aorta imaging with CT.[name]  Aortic dilatation noted. There is mild dilatation of the ascending aorta  measuring 40 mm.  8. The inferior vena cava is normal in size with greater than 50%  respiratory variability, suggesting right atrial pressure of 3 mmHg.   Antimicrobials:  Anti-infectives (From admission, onward)   None        Subjective: No new complaints  Objective: Vitals:   02/05/20 0705 02/05/20 0742 02/05/20 0744 02/05/20 1216  BP:  (!) 183/91 (!) 182/85 140/72  Pulse:  69 71 63  Resp:  19  19  Temp:  98.5 F (36.9 C)  97.6 F (36.4 C)  TempSrc:      SpO2: 97% 99% 99% 99%  Weight:      Height:        Intake/Output Summary (Last 24 hours) at 02/05/2020 1226 Last data filed at 02/05/2020 0450 Gross per 24 hour  Intake 2300.41 ml  Output --  Net 2300.41 ml   Filed Weights   02/02/20 2200 02/04/20 0500 02/05/20 0402  Weight: 83.3 kg 86.9 kg 86.8 kg    Examination:  General: No acute distress. Cardiovascular: Heart sounds show Chizuko Trine regular rate, and rhythm Lungs: Clear to auscultation bilaterally  Abdomen: Soft, nontender, nondistended  Neurological: Alert and oriented 3. Moves all extremities 4 . Cranial nerves II through XII grossly intact. Skin: Warm and dry. No rashes or lesions. Extremities: No clubbing or cyanosis. No edema.   Data Reviewed: I have personally reviewed following labs and imaging studies  CBC: Recent Labs  Lab 02/02/20 1721 02/03/20 0246 02/04/20 0148 02/05/20 0303  WBC 4.1 5.7 3.1* 3.4*  NEUTROABS 3.1  --   --   --   HGB 9.9* 9.6* 8.9* 9.7*  HCT 30.3* 29.1* 27.2* 29.4*  MCV 92.4 90.7 92.2 91.3  PLT 154 151 137* 145*    Basic Metabolic Panel: Recent Labs  Lab 02/02/20 1721 02/02/20 1727  02/03/20 0246 02/04/20 0148 02/05/20 0303  NA 139  --  139 139 139  K 4.0  --  3.3* 3.8 3.7  CL 107  --  107 109 110  CO2 24  --  22 22 22   GLUCOSE 118*  --  146* 109* 97  BUN 31*  --  32* 38* 35*  CREATININE 3.29*  --  3.26* 3.57* 3.32*  CALCIUM 8.9  --  8.8* 8.9 9.0  MG  --  1.5*  --  1.6* 1.6*    GFR: Estimated Creatinine Clearance: 24.7 mL/min (Kassey Laforest) (by C-G formula based on SCr of 3.32 mg/dL (H)).  Liver Function Tests: No results for input(s): AST, ALT, ALKPHOS, BILITOT, PROT, ALBUMIN in the last 168 hours.  CBG: Recent Labs  Lab 02/02/20 1720 02/02/20 2211 02/04/20 0540 02/05/20 0359  GLUCAP 108* 109* 73 86     Recent Results (from the past 240 hour(s))  SARS Coronavirus 2 by RT PCR (hospital order, performed in [facility] hospital lab) Nasopharyngeal Nasopharyngeal Swab     Status:  None   Collection Time: 02/02/20  8:40 PM   Specimen: Nasopharyngeal Swab  Result Value Ref Range Status   SARS Coronavirus 2 NEGATIVE NEGATIVE Final    Comment: (NOTE) SARS-CoV-2 target nucleic acids are NOT DETECTED. The SARS-CoV-2 RNA is generally detectable in upper and lower respiratory specimens during the acute phase of infection. The lowest concentration of SARS-CoV-2 viral copies this assay can detect is 250 copies / mL. Linden Mikes negative result does not preclude SARS-CoV-2 infection and should not be used as the sole basis for treatment or other patient management decisions.  Cyncere Ruhe negative result may occur with improper specimen collection / handling, submission of specimen other than nasopharyngeal swab, presence of viral mutation(s) within the areas targeted by this assay, and inadequate number of viral copies (<250 copies / mL). Eris Breck negative result must be combined with clinical observations, patient history, and epidemiological information. Fact Sheet for Patients:   StrictlyIdeas.no Fact Sheet for Healthcare  Providers: BankingDealers.co.za This test is not yet approved or cleared  by the [place] FDA and has been authorized for detection and/or diagnosis of SARS-CoV-2 by FDA under an Emergency Use Authorization (EUA).  This EUA will remain in effect (meaning this test can be used) for the duration of the COVID-19 declaration under Section 564(b)(1) of the Act, 21 U.S.C. section 360bbb-3(b)(1), unless the authorization is terminated or revoked sooner. Performed at [facility], [place] [address], [place], [place] [address]          Radiology Studies: US RENAL  Result Date: 02/04/2020 CLINICAL DATA:  Acute kidney injury. EXAM: RENAL / URINARY TRACT ULTRASOUND COMPLETE COMPARISON:  None. FINDINGS: Right Kidney: Renal measurements: 9.1 x 3.8 x 4.1 cm = volume: Is 74.1 mL . Echogenicity within normal limits. No mass or hydronephrosis visualized. Left Kidney: Renal measurements: 10.9 x 4.7 x 3.6 cm = volume: 97.0 mL. Echogenicity within normal limits. No mass or hydronephrosis visualized. Bladder: Appears normal for degree of bladder distention. Other: The prostate is enlarged measuring 6.4 x 6.6 x 7.5 cm for Kayle Passarelli volume of 166 mL. IMPRESSION: Normal appearing kidneys.  Negative for hydronephrosis. Prostatomegaly. Electronically Signed   By: Inge Rise M.D.   On: 02/04/2020 14:47        Scheduled Meds: . amLODipine  5 mg Oral Daily  . atorvastatin  40 mg Oral QHS  . buPROPion  75 mg Oral Daily  . carvedilol  25 mg Oral BID WC  . cloNIDine  0.3 mg Transdermal Q Thu  . clopidogrel  75 mg Oral Daily  . escitalopram  10 mg Oral QHS  . finasteride  5 mg Oral QHS  . gabapentin  300 mg Oral QHS  . heparin injection (subcutaneous)  5,000 Units Subcutaneous Q8H  . latanoprost  1 drop Both Eyes QHS  . magnesium oxide  400 mg Oral BID  . mometasone-formoterol  2 puff Inhalation BID  . montelukast  10 mg Oral QHS  . sodium chloride flush  3 mL Intravenous Q12H  .  tamsulosin  0.8 mg Oral QHS   Continuous Infusions: . lactated ringers 100 mL/hr at 02/05/20 0831     LOS: 2 days    Time spent: over 30 min    Fayrene Helper, MD [facility]   To contact the attending provider between 7A-7P or the covering provider during after hours 7P-7A, please log into the web site [email or web address] and access using universal [place] password for that web site. If you do not have  the password, please call the hospital operator.  02/05/2020, 12:26 PM

## 2020-02-05 NOTE — Progress Notes (Signed)
Physical Therapy Treatment Patient Details Name: Cody Silva MRN: [id number] DOB: Apr 15, 1946 Today's Date: 02/05/2020    History of Present Illness 74 yo male admitted from home with syncope vs Seizure. Pt experienced vomiting and LOC. pt with acute kidney injury.   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 Additionally, noted pt's echo with artifact but also to have MRI to r/o aortic dissection. Pt returned to supine.   Follow Up Recommendations  No PT follow up     Equipment Recommendations  None recommended by PT    Recommendations for Other Services       Precautions / Restrictions Precautions Precautions: Fall Precaution Comments: orthostatic bp    Mobility  Bed Mobility Overal bed mobility: Modified Independent                Transfers Overall transfer level: Needs assistance Equipment used: Rolling walker (2 wheeled) Transfers: Sit to/from Stand Sit to Stand: Min guard         General transfer comment: min guard for safety; sit to stand x 2 with cues for safe hand placement  Ambulation/Gait             General Gait Details: held due to orthostatic hypotension and while in room noted pt to have MRI ordered to eval for aortic dissection , so returned to supine   Stairs             Wheelchair Mobility    Modified Rankin (Stroke Patients Only)       Balance     Sitting balance-Leahy Scale: Good       Standing balance-Leahy Scale: Fair Standing balance comment: with RW                            Cognition Arousal/Alertness: Awake/alert Behavior During Therapy: WFL for tasks assessed/performed Overall Cognitive Status: Within Functional Limits for tasks assessed                                         Exercises      General Comments General comments (skin integrity, edema, etc.): BP supine 148/70, sit 150/73, standing 129/69, standing 3 mins 122/66.  Pt reports lightheadedness in standing that increased the longer he stood.      Pertinent Vitals/Pain Pain Assessment: No/denies pain    Home Living                      Prior Function            PT Goals (current goals can now be found in the care plan section) Progress towards PT goals: Progressing toward goals    Frequency    Min 3X/week      PT Plan Current plan remains appropriate    Co-evaluation              AM-PAC PT "6 Clicks" Mobility   Outcome Measure  Help needed turning from your back to your side while in a flat bed without using bedrails?: None Help needed moving from lying on your back to sitting on the side of  a flat bed without using bedrails?: A Little Help needed moving to and from a bed to a chair (including a wheelchair)?: A Little Help needed standing up from a chair using your arms (e.g., wheelchair or bedside chair)?: A Little Help needed to walk in hospital room?: A Little Help needed climbing 3-5 steps with a railing? : A Little 6 Click Score: 19    End of Session Equipment Utilized During Treatment: Gait belt Activity Tolerance: Other (comment)(orthostatic hypotension) Patient left: in chair;with call bell/phone within reach Nurse Communication: Mobility status;Other (comment)(orthostatic) PT Visit Diagnosis: Muscle weakness (generalized) (M62.81);Difficulty in walking, not elsewhere classified (R26.2)     Time: 9892-1194 PT Time Calculation (min) (ACUTE ONLY): 10 min  Charges:  $Therapeutic Activity: 8-22 mins                     Maggie Font, PT Acute Rehab Services Pager [phone] [facility] [phone] [facility] [place] 02/05/2020, 1:53 PM

## 2020-02-06 ENCOUNTER — Inpatient Hospital Stay (HOSPITAL_COMMUNITY): Payer: Medicare PPO

## 2020-02-06 LAB — BASIC METABOLIC PANEL
Anion gap: 8 (ref 5–15)
BUN: 34 mg/dL — ABNORMAL HIGH (ref 8–23)
CO2: 23 mmol/L (ref 22–32)
Calcium: 9 mg/dL (ref 8.9–10.3)
Chloride: 109 mmol/L (ref 98–111)
Creatinine, Ser: 3.4 mg/dL — ABNORMAL HIGH (ref 0.61–1.24)
GFR calc Af Amer: 20 mL/min — ABNORMAL LOW (ref >60)
GFR calc non Af Amer: 17 mL/min — ABNORMAL LOW (ref >60)
Glucose, Bld: 94 mg/dL (ref 70–99)
Potassium: 3.6 mmol/L (ref 3.5–5.1)
Sodium: 140 mmol/L (ref 135–145)

## 2020-02-06 LAB — GLUCOSE, CAPILLARY: Glucose-Capillary: 85 mg/dL (ref 70–99)

## 2020-02-06 LAB — CBC
HCT: 27.2 % — ABNORMAL LOW (ref 39.0–52.0)
Hemoglobin: 9.1 g/dL — ABNORMAL LOW (ref 13.0–17.0)
MCH: 30.7 pg (ref 26.0–34.0)
MCHC: 33.5 g/dL (ref 30.0–36.0)
MCV: 91.9 fL (ref 80.0–100.0)
Platelets: 137 10*3/uL — ABNORMAL LOW (ref 150–400)
RBC: 2.96 MIL/uL — ABNORMAL LOW (ref 4.22–5.81)
RDW: 13.5 % (ref 11.5–15.5)
WBC: 2.9 10*3/uL — ABNORMAL LOW (ref 4.0–10.5)
nRBC: 0 % (ref 0.0–0.2)

## 2020-02-06 LAB — MAGNESIUM: Magnesium: 1.8 mg/dL (ref 1.7–2.4)

## 2020-02-06 NOTE — Progress Notes (Signed)
PROGRESS NOTE    Cody Silva  OQH:[id number] DOB: 1946-06-01 DOA: 02/02/2020 PCP: [facility]   Chief Complaint  Patient presents with  . Near Syncope    Brief Narrative:  74 y.o.malewith medical history significant forhistory of multiple CVAs with residual right-sided weakness, CKD stage III-IV, type 2 diabetes, hypertension, hyperlipidemia, blindness in the left eye, and OSA on CPAPwho presents to the ED for evaluation after anear syncopal episode.  Also found to have AKI.  Assessment & Plan:   Principal Problem:   Near syncope Active Problems:   Hypertension associated with diabetes ([place])   History of CVA (cerebrovascular accident)   Acute renal failure superimposed on stage 3b chronic kidney disease (HCC)   Type 2 diabetes with nephropathy (HCC)   OSA on CPAP   Hyperlipidemia associated with type 2 diabetes mellitus (HCC)   AKI (acute kidney injury) ([place])  Near-syncope  Orthostatic Hypotension: -Currently resolved.  Suspect related to orthostatic hypotension.  CT of the head negative.  Negative orthostatics.  EKG unremarkable (prolonged QT - will repeat EKG), cardiac enzymes remain flat.  Wife notes he's having issues feeding himself, this is new and was not issue prior to admission, will obtain MRI brain to r/o stroke. - per discussion with PT, pt became diaphoretic, pale, and lightheaded with therapy when standing- limiting their ability to work with him - BP supine 148/70, sit 150/73, standing 129/69, standing 3 mins 122/66  .  Will place order for ted hose.  Balance with his hypertension and orthostasis will be difficult.  Will adjust as able. -Echocardiogram- EF 54-65%, grade 1 diastolic dysfunction  -PT/OT- without recommendations for follow up - continue telemetry  Abnormal Echo  Thoracic Aortic Aneurysm: Artifact, line visualized within lumen of ascending aorta, suggestive of artifact vs possible dissection.  Per cards, "highly suggestive of  artifact", but if clinical concern for dissection, "this cannot be determined given the images". - MRA chest without dissection - 4.2 cm ascending thoracic aortic aneurysm which needs to be followed annually  Acute kidney injury on CKD stage IIIb  Proteinuria -Baseline creatinine 2.0 2019, admission creatinine 3.29, peaked to 3.57 -> 3.4 today - I suspect he has chronic CKD and this maybe his recent baseline (difficult to tell as most recent labs we have are from 2019, but relatively stable here) - will need to follow outpatient with his primary nephrologist - hold chlorthalidone and losartan discontinue these meds at discharge - US renal negative for hydro - UA -> notable for proteinuria, 0-5 RBC's (follow proteinuria outpatient), CK wnl - continue IVF  Hypertensive urgency, improved Essential hypertension -Resume home amlodipine,Coreg,and clonidine patch -Losartan, clorthalidone on hold for aki  Diabetes mellitus type 2 Not on any home meds.  Hemoglobin is 5.8  History of multiple CVAs with residual right-sided weakness -Continue Plavix and statin  Hyperlipidemia: -Continue Lipitor  Depression/PTSD: -Continue Lexapro and Wellbutrin  OSA: -Bedtime CPAP.  DVT prophylaxis: heparin  Code Status: full  Family Communication: none at bedside - discussed with wife 6/4 Disposition:   Status is: Inpatient  Remains inpatient appropriate because:Inpatient level of care appropriate due to severity of illness   Dispo: The patient is from: Home              Anticipated d/c is to: Home              Anticipated d/c date is: 2 days              Patient currently is  not medically stable to d/c.  Consultants:   none  Procedures:  Echo IMPRESSIONS    1. Left ventricular ejection fraction, by estimation, is 50 to 55%. The  left ventricle has low normal function. The left ventricle has no regional  wall motion abnormalities. There is moderate concentric left ventricular   hypertrophy. Left ventricular  diastolic parameters are consistent with Grade I diastolic dysfunction  (impaired relaxation).  2. Right ventricular systolic function is normal. The right ventricular  size is normal. Tricuspid regurgitation signal is inadequate for assessing  PA pressure.  3. Left atrial size was moderately dilated.  4. Right atrial size was mildly dilated.  5. The mitral valve is normal in structure. Mild mitral valve  regurgitation. No evidence of mitral stenosis.  6. The aortic valve is tricuspid. Aortic valve regurgitation is mild.  Mild aortic valve sclerosis is present, with no evidence of aortic valve  stenosis.  7. There is Lehman Whiteley line visualized within the lumen of the ascendening aorta.  Given its location, motion, and visualization in other views, this is  highly suggestive of artifact. However, if there is clinical concern for  dissection, this cannot be determine  given the images, and would recommend dedicated aorta imaging with CT.[name]  Aortic dilatation noted. There is mild dilatation of the ascending aorta  measuring 40 mm.  8. The inferior vena cava is normal in size with greater than 50%  respiratory variability, suggesting right atrial pressure of 3 mmHg.   Antimicrobials:  Anti-infectives (From admission, onward)   None     Subjective: No new complaints  Objective: Vitals:   02/05/20 2223 02/06/20 0447 02/06/20 0710 02/06/20 0746  BP: (!) 145/79   (!) 166/82  Pulse: 62   69  Resp: 17   19  Temp: 98.3 F (36.8 C)   98.3 F (36.8 C)  TempSrc: Oral     SpO2: 100%  95% 100%  Weight:  84.9 kg    Height:        Intake/Output Summary (Last 24 hours) at 02/06/2020 1327 Last data filed at 02/06/2020 0754 Gross per 24 hour  Intake --  Output 150 ml  Net -150 ml   Filed Weights   02/04/20 0500 02/05/20 0402 02/06/20 0447  Weight: 86.9 kg 86.8 kg 84.9 kg    Examination:  General: No acute distress. Cardiovascular: Heart sounds show Louay Myrie  regular rate, and rhythm Lungs: Clear to auscultation bilaterally  Abdomen: Soft, nontender, nondistended  Neurological: Alert and oriented 3. Moves all extremities 4. Cranial nerves II through XII grossly intact. Skin: Warm and dry. No rashes or lesions. Extremities: No clubbing or cyanosis. No edema.  Data Reviewed: I have personally reviewed following labs and imaging studies  CBC: Recent Labs  Lab 02/02/20 1721 02/03/20 0246 02/04/20 0148 02/05/20 0303 02/06/20 0251  WBC 4.1 5.7 3.1* 3.4* 2.9*  NEUTROABS 3.1  --   --   --   --   HGB 9.9* 9.6* 8.9* 9.7* 9.1*  HCT 30.3* 29.1* 27.2* 29.4* 27.2*  MCV 92.4 90.7 92.2 91.3 91.9  PLT 154 151 137* 145* 137*    Basic Metabolic Panel: Recent Labs  Lab 02/02/20 1721 02/02/20 1727 02/03/20 0246 02/04/20 0148 02/05/20 0303 02/06/20 0251  NA 139  --  139 139 139 140  K 4.0  --  3.3* 3.8 3.7 3.6  CL 107  --  107 109 110 109  CO2 24  --  22 22 22 23   GLUCOSE 118*  --  146* 109* 97 94  BUN 31*  --  32* 38* 35* 34*  CREATININE 3.29*  --  3.26* 3.57* 3.32* 3.40*  CALCIUM 8.9  --  8.8* 8.9 9.0 9.0  MG  --  1.5*  --  1.6* 1.6* 1.8    GFR: Estimated Creatinine Clearance: 23.6 mL/min (Jaylin Benzel) (by C-G formula based on SCr of 3.4 mg/dL (H)).  Liver Function Tests: No results for input(s): AST, ALT, ALKPHOS, BILITOT, PROT, ALBUMIN in the last 168 hours.  CBG: Recent Labs  Lab 02/02/20 1720 02/02/20 2211 02/04/20 0540 02/05/20 0359 02/06/20 0429  GLUCAP 108* 109* 73 86 85     Recent Results (from the past 240 hour(s))  SARS Coronavirus 2 by RT PCR (hospital order, performed in [facility] hospital lab) Nasopharyngeal Nasopharyngeal Swab     Status: None   Collection Time: 02/02/20  8:40 PM   Specimen: Nasopharyngeal Swab  Result Value Ref Range Status   SARS Coronavirus 2 NEGATIVE NEGATIVE Final    Comment: (NOTE) SARS-CoV-2 target nucleic acids are NOT DETECTED. The SARS-CoV-2 RNA is generally detectable in upper and  lower respiratory specimens during the acute phase of infection. The lowest concentration of SARS-CoV-2 viral copies this assay can detect is 250 copies / mL. Runa Whittingham negative result does not preclude SARS-CoV-2 infection and should not be used as the sole basis for treatment or other patient management decisions.  Ronica Vivian negative result may occur with improper specimen collection / handling, submission of specimen other than nasopharyngeal swab, presence of viral mutation(s) within the areas targeted by this assay, and inadequate number of viral copies (<250 copies / mL). Khasir Woodrome negative result must be combined with clinical observations, patient history, and epidemiological information. Fact Sheet for Patients:   StrictlyIdeas.no Fact Sheet for Healthcare Providers: BankingDealers.co.za This test is not yet approved or cleared  by the [place] FDA and has been authorized for detection and/or diagnosis of SARS-CoV-2 by FDA under an Emergency Use Authorization (EUA).  This EUA will remain in effect (meaning this test can be used) for the duration of the COVID-19 declaration under Section 564(b)(1) of the Act, 21 U.S.C. section 360bbb-3(b)(1), unless the authorization is terminated or revoked sooner. Performed at [facility], [place] [address] [place], [place] [address]          Radiology Studies: MR ANGIO CHEST WO CONTRAST  Result Date: 02/06/2020 CLINICAL DATA:  Syncope of suspected cardiac cause. Renal failure, hypertension, hyperlipidemia. Possible dissection on echocardiography EXAM: MRA CHEST   WITHOUT CONTRAST TECHNIQUE: Angiographic images of the chest were obtained using MRA technique without intravenous contrast. CONTRAST:  None COMPARISON:  None. FINDINGS: Cardiovascular: Heart size normal. No pericardial effusion. Central pulmonary arteries unremarkable. Aortic Root: --Valve: 3.4 cm --Sinuses: 4.8 cm --Sinotubular Junction: 4.2 cm  Limitations by motion: Minimal on gated  sequences Thoracic Aorta: --Ascending Aorta: 4.2 cm --Aortic Arch: 3 cm --Descending Aorta: 3.1 cm No evidence of dissection. No significant atheromatous change. Bovine variant brachiocephalic arterial origin anatomy without proximal stenosis. Mediastinum/Nodes: No mass or adenopathy Lungs/Pleura: No significant pleural effusion. No evident mass or consolidation. Upper Abdomen: No acute findings Musculoskeletal: Limited, unremarkable Review of the MIP images confirms the above findings. IMPRESSION: 1. Negative for thoracic aortic dissection. 2. 4.2 cm ascending thoracic aortic aneurysm without complicating features. Recommend annual imaging followup by CTA or MRA. This recommendation follows 2010 ACCF/AHA/AATS/ACR/ASA/SCA/SCAI/SIR/STS/SVM Guidelines for the Diagnosis and Management of Patients with Thoracic Aortic Disease. Circulation. 2010; 121: Y694-W546 Electronically Signed  By: Lucrezia Europe M.D.   On: 02/06/2020 08:17        Scheduled Meds: . amLODipine  5 mg Oral Daily  . atorvastatin  40 mg Oral QHS  . buPROPion  75 mg Oral Daily  . carvedilol  25 mg Oral BID WC  . cloNIDine  0.3 mg Transdermal Q Thu  . clopidogrel  75 mg Oral Daily  . escitalopram  10 mg Oral QHS  . finasteride  5 mg Oral QHS  . gabapentin  300 mg Oral QHS  . heparin injection (subcutaneous)  5,000 Units Subcutaneous Q8H  . latanoprost  1 drop Both Eyes QHS  . mometasone-formoterol  2 puff Inhalation BID  . montelukast  10 mg Oral QHS  . sodium chloride flush  3 mL Intravenous Q12H  . tamsulosin  0.8 mg Oral QHS   Continuous Infusions:    LOS: 3 days    Time spent: over 30 min    Fayrene Helper, MD [facility]   To contact the attending provider between 7A-7P or the covering provider during after hours 7P-7A, please log into the web site [email or web address] and access using universal [place] password for that web site. If you do not have the password, please  call the hospital operator.  02/06/2020, 1:27 PM

## 2020-02-06 NOTE — Progress Notes (Signed)
Physical Therapy Treatment Patient Details Name: Cody Silva MRN: [id number] DOB: 1946/08/22 Today's Date: 02/06/2020    History of Present Illness 74 yo male admitted from home with syncope vs Seizure. Pt experienced vomiting and LOC. pt with acute kidney injury.  Pt had MRI chest: negative for aortic dissection and MRI brain: negative acute changes.  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  Pt did not have a significant decline in BP from sit to stand and BP improved after 3 mins of standing (see vitals in general comments).  RN reports medication changes and pt with TED hose. Pt continues to have mild decreases in balance and mobiltiy and will cont to benefit from acute PT services.     Follow Up Recommendations  No PT follow up     Equipment Recommendations  None recommended by PT    Recommendations for Other Services       Precautions / Restrictions Precautions Precautions: Fall Precaution Comments: orthostatic bp    Mobility  Bed Mobility               General bed mobility comments: in chair at arrival  Transfers Overall transfer level: Needs assistance Equipment used: Rolling walker (2 wheeled) Transfers: Sit to/from Stand Sit to Stand: Min guard         General transfer comment: min guard for safety; sit to stand x 2 with cues for safe hand placement  Ambulation/Gait Ambulation/Gait assistance: Min guard Gait Distance (Feet): 80 Feet Assistive device: Rolling walker (2 wheeled) Gait Pattern/deviations: Step-through pattern Gait velocity: decr   General Gait Details: pt declines walking in hall; walked in circles in room ; steady, no LOB - min c/o lightheadedness after walking 3 mins (see  vitals below)   Stairs             Wheelchair Mobility    Modified Rankin (Stroke Patients Only)       Balance Overall balance assessment: Needs assistance Sitting-balance support: Feet supported Sitting balance-Leahy Scale: Good     Standing balance support: No upper extremity supported Standing balance-Leahy Scale: Good                              Cognition Arousal/Alertness: Awake/alert Behavior During Therapy: WFL for tasks assessed/performed Overall Cognitive Status: Within Functional Limits for tasks assessed                                        Exercises      General Comments General comments (skin integrity, edema, etc.): Pt was in chair at arrival with TED hose in place and with bp of 136/77 and HR 55.  Pt stood and BP 122/72 and HR 65. No c/o lightheadedness.  Pt ambulated 80' and was standing for 3 mins and had c/o lightheadedness.  Still standing BP 128/72 and HR 65.  Pt was NOT diaphoretic or pale today.  Reports symptoms of mild lightheadedness - states "not dizzy, and not like passing out, just lightheaded."  Notified RN.      Pertinent Vitals/Pain Pain Assessment: No/denies pain  Home Living                      Prior Function            PT Goals (current goals can now be found in the care plan section) Acute Rehab PT Goals Patient Stated Goal: to return home PT Goal Formulation: With patient Time For Goal Achievement: 02/17/20 Potential to Achieve Goals: Good Progress towards PT goals: Progressing toward goals    Frequency    Min 3X/week      PT Plan Current plan remains appropriate    Co-evaluation              AM-PAC PT "6 Clicks" Mobility   Outcome Measure  Help needed turning from your back to your side while in a flat bed without using bedrails?: None Help needed moving from lying on your back to sitting on the side of a flat bed without using bedrails?: None Help needed  moving to and from a bed to a chair (including a wheelchair)?: None Help needed standing up from a chair using your arms (e.g., wheelchair or bedside chair)?: None Help needed to walk in hospital room?: None Help needed climbing 3-5 steps with a railing? : A Little 6 Click Score: 23    End of Session Equipment Utilized During Treatment: Gait belt Activity Tolerance: Patient tolerated treatment well Patient left: in chair;with call bell/phone within reach Nurse Communication: Mobility status;Other (comment)(vitals) PT Visit Diagnosis: Muscle weakness (generalized) (M62.81);Difficulty in walking, not elsewhere classified (R26.2)     Time: 4235-3614 PT Time Calculation (min) (ACUTE ONLY): 20 min  Charges:  $Gait Training: 8-22 mins                     Maggie Font, PT Acute Rehab Services Pager [phone] [facility] 02/06/2020, 5:13 PM

## 2020-02-07 LAB — CBC
HCT: 29 % — ABNORMAL LOW (ref 39.0–52.0)
Hemoglobin: 9.5 g/dL — ABNORMAL LOW (ref 13.0–17.0)
MCH: 30.1 pg (ref 26.0–34.0)
MCHC: 32.8 g/dL (ref 30.0–36.0)
MCV: 91.8 fL (ref 80.0–100.0)
Platelets: 130 10*3/uL — ABNORMAL LOW (ref 150–400)
RBC: 3.16 MIL/uL — ABNORMAL LOW (ref 4.22–5.81)
RDW: 13.4 % (ref 11.5–15.5)
WBC: 3.4 10*3/uL — ABNORMAL LOW (ref 4.0–10.5)
nRBC: 0 % (ref 0.0–0.2)

## 2020-02-07 LAB — BASIC METABOLIC PANEL
Anion gap: 8 (ref 5–15)
BUN: 34 mg/dL — ABNORMAL HIGH (ref 8–23)
CO2: 23 mmol/L (ref 22–32)
Calcium: 9 mg/dL (ref 8.9–10.3)
Chloride: 106 mmol/L (ref 98–111)
Creatinine, Ser: 3.53 mg/dL — ABNORMAL HIGH (ref 0.61–1.24)
GFR calc Af Amer: 19 mL/min — ABNORMAL LOW (ref >60)
GFR calc non Af Amer: 16 mL/min — ABNORMAL LOW (ref >60)
Glucose, Bld: 91 mg/dL (ref 70–99)
Potassium: 3.6 mmol/L (ref 3.5–5.1)
Sodium: 137 mmol/L (ref 135–145)

## 2020-02-07 LAB — MAGNESIUM: Magnesium: 1.7 mg/dL (ref 1.7–2.4)

## 2020-02-07 NOTE — Discharge Summary (Signed)
Physician Discharge Summary  Cody Silva WSF:[id number] DOB: 09/06/1945 DOA: 02/02/2020  PCP: [facility]  Admit date: 02/02/2020 Discharge date: 02/07/2020  Time spent: 40 minutes  Recommendations for Outpatient Follow-up:  1. Follow outpatient CBC/CMP  2. Follow orthostatic hypotension/supine hypertension outpatient - adjust BP meds as needed 3. Follow renal function outpatient  4. Follow proteinuria outpatient 5. Follow thoracic aortic aneurysm outpatient   Discharge Diagnoses:  Principal Problem:   Near syncope Active Problems:   Hypertension associated with diabetes ([place])   History of CVA (cerebrovascular accident)   Acute renal failure superimposed on stage 3b chronic kidney disease (HCC)   Type 2 diabetes with nephropathy (HCC)   OSA on CPAP   Hyperlipidemia associated with type 2 diabetes mellitus ([place])   AKI (acute kidney injury) ([place])  Discharge Condition: stable  Diet recommendation: heart healthy  Filed Weights   02/05/20 0402 02/06/20 0447 02/07/20 0500  Weight: 86.8 kg 84.9 kg 85.8 kg    History of present illness:  74 y.o.malewith medical history significant forhistory of multiple CVAswith residual right-sided weakness, CKD stage III-IV, type 2 diabetes, hypertension, hyperlipidemia, blindness in the left eye, and OSA on CPAPwho presents to the ED for evaluation after anear syncopal episode.Also found to have AKI.  He was admitted for presyncope and aki.  His workup was notable for orthostatic hypotension.  He was improved at discharge.  His arb and thiazide were discontinued due to his elevated creatinine, which was stable and is thought to represent CKD (though I'm unable to see recent labs).  He was discharged on 6/5 with plan for outpatient follow up.  See below for additional details  Hospital Course:  Near-syncope   Orthostatic Hypotension: -Currently resolved. Suspect related to orthostatic hypotension. CT of the head  negative.  Negative orthostatics. EKG unremarkable (prolonged QT - will repeat EKG), cardiac enzymes remain flat. - MRI brain without evidence of acute intracranial abnormality - no focal deficits noted on exam - per discussion with PT, pt became diaphoretic, pale, and lightheaded with therapy when standing- limiting their ability to work with him - BP supine 148/70, sit 150/73, standing 129/69, standing 3 mins 122/66  .  Will place order for ted hose.  Balance with his hypertension and orthostasis will be difficult.  Will adjust as able.  -> improved per PT note on 6/4, follow outpatient -Echocardiogram- EF 01-74%, grade 1 diastolic dysfunction  -PT/OT- without recommendations for follow up - continue telemetry  Abnormal Echo   Thoracic Aortic Aneurysm: Artifact, line visualized within lumen of ascending aorta, suggestive of artifact vs possible dissection.  Per cards, "highly suggestive of artifact", but if clinical concern for dissection, "this cannot be determined given the images". - MRA chest without dissection - 4.2 cm ascending thoracic aortic aneurysm which needs to be followed annually, discussed with daughter  Acute kidney injury on CKD stage IIIb   Proteinuria -Baseline creatinine 2.0 2019, admission creatinine 3.29, peaked to 3.57 -> 3.53 today - I suspect he has chronic CKD and this maybe his recent baseline (difficult to tell as most recent labs we have are from 2019, but relatively stable here) - will need to follow outpatient with his primary nephrologist - hold chlorthalidone and losartan discontinue these meds at discharge - US renal negative for hydro - UA -> notable for proteinuria, 0-5 RBC's (follow proteinuria outpatient), CK wnl - continue IVF  Hypertensive urgency, improved Essential hypertension -Resume home amlodipine,Coreg,and clonidine patch -Losartan, clorthalidone on hold for aki  Diabetes  mellitus type 2 Not on any home meds. Hemoglobin is  5.8  History of multiple CVAs with residual right-sided weakness -Continue Plavix and statin  Hyperlipidemia: -Continue Lipitor  Depression/PTSD: -Continue Lexapro and Wellbutrin  OSA: -Bedtime CPAP.  Procedures: Echo IMPRESSIONS    1. Left ventricular ejection fraction, by estimation, is 50 to 55%. The  left ventricle has low normal function. The left ventricle has no regional  wall motion abnormalities. There is moderate concentric left ventricular  hypertrophy. Left ventricular  diastolic parameters are consistent with Grade I diastolic dysfunction  (impaired relaxation).  2. Right ventricular systolic function is normal. The right ventricular  size is normal. Tricuspid regurgitation signal is inadequate for assessing  PA pressure.  3. Left atrial size was moderately dilated.  4. Right atrial size was mildly dilated.  5. The mitral valve is normal in structure. Mild mitral valve  regurgitation. No evidence of mitral stenosis.  6. The aortic valve is tricuspid. Aortic valve regurgitation is mild.  Mild aortic valve sclerosis is present, with no evidence of aortic valve  stenosis.  7. There is Carlen Rebuck line visualized within the lumen of the ascendening aorta.  Given its location, motion, and visualization in other views, this is  highly suggestive of artifact. However, if there is clinical concern for  dissection, this cannot be determine  given the images, and would recommend dedicated aorta imaging with CT.[name]  Aortic dilatation noted. There is mild dilatation of the ascending aorta  measuring 40 mm.  8. The inferior vena cava is normal in size with greater than 50%  respiratory variability, suggesting right atrial pressure of 3 mmHg.   Consultations:  none  Discharge Exam: Vitals:   02/07/20 0802 02/07/20 0802  BP:  (!) 175/79  Pulse:  64  Resp:  19  Temp:  97.9 F (36.6 C)  SpO2: 100% 100%   Feeling well, no complaints Discussed d/c plan with wife  over phone  General: No acute distress. Cardiovascular: Heart sounds show Briyah Wheelwright regular rate, and rhythm.  Lungs: Clear to auscultation bilaterally  Abdomen: Soft, nontender, nondistended  Neurological: Alert and oriented 3. Moves all extremities 4. Cranial nerves II through XII grossly intact. Skin: Warm and dry. No rashes or lesions. Extremities: No clubbing or cyanosis. No edema.   Discharge Instructions   Discharge Instructions    Call MD for:  difficulty breathing, headache or visual disturbances   Complete by: As directed    Call MD for:  extreme fatigue   Complete by: As directed    Call MD for:  hives   Complete by: As directed    Call MD for:  persistant dizziness or light-headedness   Complete by: As directed    Call MD for:  persistant nausea and vomiting   Complete by: As directed    Call MD for:  redness, tenderness, or signs of infection (pain, swelling, redness, odor or green/yellow discharge around incision site)   Complete by: As directed    Call MD for:  severe uncontrolled pain   Complete by: As directed    Call MD for:  temperature >100.4   Complete by: As directed    Diet - low sodium heart healthy   Complete by: As directed    Discharge instructions   Complete by: As directed    You were seen for an episode of presyncope (near fainting).  The work up has been notable for orthostatic hypotension (low blood pressure when you stand up).  You have  high blood pressure, but when you stand up, you get light headed.  We've placed you in compression stockings.  You should also change positions slowly (from lying down, to sitting, to standing).  We stopped your losartan and your chlorthalidone.  You'll need to continue to follow your blood pressures closely outpatient as there will be Kalika Smay fine balance to maintain.  Please follow up closely with your kidney doctor outpatient.    Return for new, recurrent, or worsening symptoms.  Please ask your PCP to request records  from this hospitalization so they know what was done and what the next steps will be.   Increase activity slowly   Complete by: As directed      Allergies as of 02/07/2020      Reactions   Metformin And Related Diarrhea   Lactose Intolerance (gi)    Tape Rash      Medication List    STOP taking these medications   chlorthalidone 25 MG tablet Commonly known as: HYGROTON   losartan 100 MG tablet Commonly known as: COZAAR     TAKE these medications   acetaminophen 500 MG tablet Commonly known as: TYLENOL Take 1 tablet (500 mg total) by mouth every 6 (six) hours as needed for mild pain, moderate pain, fever or headache.   albuterol 108 (90 Base) MCG/ACT inhaler Commonly known as: VENTOLIN HFA Inhale 1 puff into the lungs every 6 (six) hours as needed for wheezing or shortness of breath.   amLODipine 5 MG tablet Commonly known as: NORVASC Take 1 tablet (5 mg total) by mouth daily.   ammonium lactate 12 % lotion Commonly known as: LAC-HYDRIN Apply 1 application topically as needed for dry skin.   atorvastatin 40 MG tablet Commonly known as: LIPITOR Take 40 mg by mouth at bedtime.   budesonide-formoterol 80-4.5 MCG/ACT inhaler Commonly known as: SYMBICORT Inhale 2 puffs into the lungs 2 (two) times daily.   buPROPion 75 MG tablet Commonly known as: WELLBUTRIN Take 75 mg by mouth daily.   Carboxymethylcellulose Sod PF 0.25 % Soln Place 1 drop into both eyes 4 (four) times daily.   carvedilol 25 MG tablet Commonly known as: COREG Take 25 mg by mouth 2 (two) times daily with Guillermina Shaft meal.   cetirizine 10 MG tablet Commonly known as: ZYRTEC Take 10 mg by mouth at bedtime as needed for allergies.   cloNIDine 0.3 mg/24hr patch Commonly known as: CATAPRES - Dosed in mg/24 hr Replace old patch with new patch every wednesday What changed:   how much to take  how to take this  when to take this  additional instructions   clopidogrel 75 MG tablet Commonly known as:  PLAVIX Take 1 tablet (75 mg total) by mouth at bedtime. What changed: when to take this   dorzolamide-timolol 22.3-6.8 MG/ML ophthalmic solution Commonly known as: COSOPT Place 1 drop into both eyes as needed (as directed).   escitalopram 10 MG tablet Commonly known as: LEXAPRO Take 10 mg by mouth at bedtime.   finasteride 5 MG tablet Commonly known as: PROSCAR Take 5 mg by mouth at bedtime.   fluticasone 50 MCG/ACT nasal spray Commonly known as: FLONASE Place 1 spray into both nostrils as needed for allergies.   gabapentin 300 MG capsule Commonly known as: NEURONTIN Take 300 mg by mouth at bedtime.   ketotifen 0.025 % ophthalmic solution Commonly known as: ZADITOR Place 1 drop into both eyes daily as needed (for itching or irritation).   latanoprost 0.005 % ophthalmic  solution Commonly known as: XALATAN Place 1 drop into both eyes at bedtime.   melatonin 3 MG Tabs tablet Take 3 mg by mouth at bedtime.   montelukast 10 MG tablet Commonly known as: SINGULAIR Take 10 mg by mouth at bedtime.   oxybutynin 5 MG 24 hr tablet Commonly known as: DITROPAN-XL Take 5 mg by mouth daily.   tamsulosin 0.4 MG Caps capsule Commonly known as: FLOMAX Take 0.8 mg by mouth at bedtime.   terbinafine 1 % cream Commonly known as: LAMISIL Apply 1 application topically 2 (two) times daily.   tolnaftate 1 % powder Commonly known as: TINACTIN Apply 1 application topically 2 (two) times daily.   Vitamin D3 50 MCG (2000 UT) Tabs Take 2,000 Units by mouth daily.      Allergies  Allergen Reactions   Metformin And Related Diarrhea   Lactose Intolerance (Gi)    Tape Rash      The results of significant diagnostics from this hospitalization (including imaging, microbiology, ancillary and laboratory) are listed below for reference.    Significant Diagnostic Studies: DG Chest 2 View  Result Date: 02/02/2020 CLINICAL DATA:  Weakness EXAM: CHEST - 2 VIEW COMPARISON:  None.  FINDINGS: Cardiomegaly. The hila, mediastinum, lungs, and pleura are otherwise normal. IMPRESSION: No active cardiopulmonary disease. Electronically Signed   By: Dorise Bullion III M.D   On: 02/02/2020 16:39   CT Head Wo Contrast  Result Date: 02/02/2020 CLINICAL DATA:  Syncopal episode today. Dizziness and diaphoresis. Hypertension. EXAM: CT HEAD WITHOUT CONTRAST TECHNIQUE: Contiguous axial images were obtained from the base of the skull through the vertex without intravenous contrast. COMPARISON:  11/14/2017 FINDINGS: Brain: No evidence of acute infarction, hemorrhage, hydrocephalus, or mass lesion/mass effect. Mild generalized cerebral atrophy and moderate chronic small vessel disease are stable in appearance. An old small low-attenuation left parietal subdural hygroma measures approximately 8 mm in thickness and is stable since previous study. Vascular:  No hyperdense vessel or other acute findings. Skull: No evidence of fracture or other significant bone abnormality. Sinuses/Orbits:  No acute findings. Other: None. IMPRESSION: 1. No acute intracranial abnormality. 2. Stable cerebral atrophy and chronic small vessel disease. 3. 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 Electronically Signed   By: Marlaine Hind M.D.   On: 02/02/2020 16:36   MR BRAIN WO CONTRAST  Result Date: 02/06/2020 CLINICAL DATA:  Encephalopathy. EXAM: MRI HEAD WITHOUT CONTRAST TECHNIQUE: Multiplanar, multiecho pulse sequences of the brain and surrounding structures were obtained without intravenous contrast. COMPARISON:  Head CT Feb 02, 2020.  MRI of the brain Jan 19, 2017. FINDINGS: Brain: No acute infarction, hemorrhage, hydrocephalus, extra-axial collection or mass lesion. Scattered and confluent foci of T2 hyperintensity are seen within the white matter of the cerebral hemispheres and within the pons, nonspecific, most likely related to chronic small vessel ischemia which appear progressed from prior MRI. Remote lacunar  infarcts are seen bilaterally in the thalami and basal ganglia. Prominence of the cerebral and cerebellar sulci, reflecting moderate parenchymal volume loss. Vascular: Normal flow voids. Skull and upper cervical spine: Normal marrow signal. Sinuses/Orbits: Bilateral lens surgery. Paranasal sinuses are clear. Other: Right mastoid effusion. IMPRESSION: 1. No evidence of acute intracranial abnormality, including acute infarction. 2. Moderate parenchymal volume loss and chronic small vessel ischemia, progressed from prior MRI. 3. Remote lacunar infarcts in the thalami and basal ganglia. 4. Right mastoid effusion. Electronically Signed   By: Pedro Earls M.D.   On: 02/06/2020 16:01   MR ANGIO  CHEST WO CONTRAST  Result Date: 02/06/2020 CLINICAL DATA:  Syncope of suspected cardiac cause. Renal failure, hypertension, hyperlipidemia. Possible dissection on echocardiography EXAM: MRA CHEST   WITHOUT CONTRAST TECHNIQUE: Angiographic images of the chest were obtained using MRA technique without intravenous contrast. CONTRAST:  None COMPARISON:  None. FINDINGS: Cardiovascular: Heart size normal. No pericardial effusion. Central pulmonary arteries unremarkable. Aortic Root: --Valve: 3.4 cm --Sinuses: 4.8 cm --Sinotubular Junction: 4.2 cm Limitations by motion: Minimal on gated  sequences Thoracic Aorta: --Ascending Aorta: 4.2 cm --Aortic Arch: 3 cm --Descending Aorta: 3.1 cm No evidence of dissection. No significant atheromatous change. Bovine variant brachiocephalic arterial origin anatomy without proximal stenosis. Mediastinum/Nodes: No mass or adenopathy Lungs/Pleura: No significant pleural effusion. No evident mass or consolidation. Upper Abdomen: No acute findings Musculoskeletal: Limited, unremarkable Review of the MIP images confirms the above findings. IMPRESSION: 1. Negative for thoracic aortic dissection. 2. 4.2 cm ascending thoracic aortic aneurysm without complicating features. Recommend annual  imaging followup by CTA or MRA. This recommendation follows 2010 ACCF/AHA/AATS/ACR/ASA/SCA/SCAI/SIR/STS/SVM Guidelines for the Diagnosis and Management of Patients with Thoracic Aortic Disease. Circulation. 2010; 121: B762-G315 Electronically Signed   By: Lucrezia Europe M.D.   On: 02/06/2020 08:17   US RENAL  Result Date: 02/04/2020 CLINICAL DATA:  Acute kidney injury. EXAM: RENAL / URINARY TRACT ULTRASOUND COMPLETE COMPARISON:  None. FINDINGS: Right Kidney: Renal measurements: 9.1 x 3.8 x 4.1 cm = volume: Is 74.1 mL . Echogenicity within normal limits. No mass or hydronephrosis visualized. Left Kidney: Renal measurements: 10.9 x 4.7 x 3.6 cm = volume: 97.0 mL. Echogenicity within normal limits. No mass or hydronephrosis visualized. Bladder: Appears normal for degree of bladder distention. Other: The prostate is enlarged measuring 6.4 x 6.6 x 7.5 cm for Anju Sereno volume of 166 mL. IMPRESSION: Normal appearing kidneys.  Negative for hydronephrosis. Prostatomegaly. Electronically Signed   By: Inge Rise M.D.   On: 02/04/2020 14:47   ECHOCARDIOGRAM COMPLETE  Result Date: 02/03/2020    ECHOCARDIOGRAM REPORT   Patient Name:   Cody Silva [facility] Date of Exam: 02/03/2020 Medical Rec #:  [id number]         Height:       76.0 in Accession #:    [id number]        Weight:       183.6 lb Date of Birth:  1946/03/16        BSA:          2.136 m Patient Age:    74 years          BP:           159/119 mmHg Patient Gender: M                 HR:           75 bpm. Exam Location:  Inpatient Procedure: 2D Echo, Cardiac Doppler and Color Doppler Indications:    R55 Syncope  History:        Patient has prior history of Echocardiogram examinations, most                 recent 12/07/2017. Stroke; Risk Factors:Hypertension, Diabetes and                 Sleep Apnea. Seizures.  Sonographer:    Jonelle Sidle Dance Referring Phys: [id number] [place]  1. Left ventricular ejection fraction, by estimation, is 50 to 55%. The left ventricle has  low normal function. The left ventricle has no regional  wall motion abnormalities. There is moderate concentric left ventricular hypertrophy. Left ventricular diastolic parameters are consistent with Grade I diastolic dysfunction (impaired relaxation).  2. Right ventricular systolic function is normal. The right ventricular size is normal. Tricuspid regurgitation signal is inadequate for assessing PA pressure.  3. Left atrial size was moderately dilated.  4. Right atrial size was mildly dilated.  5. The mitral valve is normal in structure. Mild mitral valve regurgitation. No evidence of mitral stenosis.  6. The aortic valve is tricuspid. Aortic valve regurgitation is mild. Mild aortic valve sclerosis is present, with no evidence of aortic valve stenosis.  7. There is Dosha Broshears line visualized within the lumen of the ascendening aorta. Given its location, motion, and visualization in other views, this is highly suggestive of artifact. However, if there is clinical concern for dissection, this cannot be determine given the images, and would recommend dedicated aorta imaging with CT.[name] Aortic dilatation noted. There is mild dilatation of the ascending aorta measuring 40 mm.  8. The inferior vena cava is normal in size with greater than 50% respiratory variability, suggesting right atrial pressure of 3 mmHg. Comparison(s): No significant change from prior study. Conclusion(s)/Recommendation(s): Line in ascending aorta is suggestive of artifact. If clinical concern for aortic dissection, recommend dedicated imaging. FINDINGS  Left Ventricle: Left ventricular ejection fraction, by estimation, is 50 to 55%. The left ventricle has low normal function. The left ventricle has no regional wall motion abnormalities. The left ventricular internal cavity size was normal in size. There is moderate concentric left ventricular hypertrophy. Left ventricular diastolic parameters are consistent with Grade I diastolic dysfunction (impaired  relaxation). Right Ventricle: The right ventricular size is normal. No increase in right ventricular wall thickness. Right ventricular systolic function is normal. Tricuspid regurgitation signal is inadequate for assessing PA pressure. Left Atrium: Left atrial size was moderately dilated. Right Atrium: Right atrial size was mildly dilated. Pericardium: There is no evidence of pericardial effusion. Mitral Valve: The mitral valve is normal in structure. Mild mitral valve regurgitation. No evidence of mitral valve stenosis. Tricuspid Valve: The tricuspid valve is normal in structure. Tricuspid valve regurgitation is trivial. No evidence of tricuspid stenosis. Aortic Valve: The aortic valve is tricuspid. Aortic valve regurgitation is mild. Aortic regurgitation PHT measures 948 msec. Mild aortic valve sclerosis is present, with no evidence of aortic valve stenosis. There is mild calcification of the aortic valve. Pulmonic Valve: The pulmonic valve was not well visualized. Pulmonic valve regurgitation is not visualized. Aorta: There is Chanice Brenton line visualized within the lumen of the ascendening aorta. Given its location, motion, and visualization in other views, this is highly suggestive of artifact. However, if there is clinical concern for dissection, this cannot be determine given the images, and would recommend dedicated aorta imaging with CT. Aortic dilatation noted. There is mild dilatation of the ascending aorta measuring 40 mm. Venous: The inferior vena cava is normal in size with greater than 50% respiratory variability, suggesting right atrial pressure of 3 mmHg. IAS/Shunts: No atrial level shunt detected by color flow Doppler.  LEFT VENTRICLE PLAX 2D LVIDd:         5.10 cm  Diastology LVIDs:         3.50 cm  LV e' lateral:   5.22 cm/s LV PW:         1.50 cm  LV E/e' lateral: 15.7 LV IVS:        1.50 cm  LV e' medial:    4.90 cm/s LVOT diam:  2.20 cm  LV E/e' medial:  16.7 LV SV:         93 LV SV Index:   43 LVOT  Area:     3.80 cm  RIGHT VENTRICLE             IVC RV Basal diam:  2.90 cm     IVC diam: 1.50 cm RV S prime:     15.70 cm/s TAPSE (M-mode): 2.8 cm LEFT ATRIUM              Index LA diam:        4.70 cm  2.20 cm/m LA Vol (A2C):   163.0 ml 76.30 ml/m LA Vol (A4C):   85.8 ml  40.16 ml/m LA Biplane Vol: 127.0 ml 59.45 ml/m  AORTIC VALVE LVOT Vmax:   110.00 cm/s LVOT Vmean:  72.800 cm/s LVOT VTI:    0.244 m AI PHT:      948 msec  AORTA Ao Root diam: 4.00 cm Ao Asc diam:  4.00 cm MITRAL VALVE MV Area (PHT): 3.72 cm     SHUNTS MV Decel Time: 204 msec     Systemic VTI:  0.24 m MV E velocity: 82.00 cm/s   Systemic Diam: 2.20 cm MV Jassmin Kemmerer velocity: 129.00 cm/s MV E/Shantella Blubaugh ratio:  0.64 Buford Dresser MD Electronically signed by Buford Dresser MD Signature Date/Time: 02/03/2020/10:29:25 AM    Final     Microbiology: Recent Results (from the past 240 hour(s))  SARS Coronavirus 2 by RT PCR (hospital order, performed in [facility] hospital lab) Nasopharyngeal Nasopharyngeal Swab     Status: None   Collection Time: 02/02/20  8:40 PM   Specimen: Nasopharyngeal Swab  Result Value Ref Range Status   SARS Coronavirus 2 NEGATIVE NEGATIVE Final    Comment: (NOTE) SARS-CoV-2 target nucleic acids are NOT DETECTED. The SARS-CoV-2 RNA is generally detectable in upper and lower respiratory specimens during the acute phase of infection. The lowest concentration of SARS-CoV-2 viral copies this assay can detect is 250 copies / mL. Tarhonda Hollenberg negative result does not preclude SARS-CoV-2 infection and should not be used as the sole basis for treatment or other patient management decisions.  Johni Narine negative result may occur with improper specimen collection / handling, submission of specimen other than nasopharyngeal swab, presence of viral mutation(s) within the areas targeted by this assay, and inadequate number of viral copies (<250 copies / mL). Aurianna Earlywine negative result must be combined with clinical observations, patient history, and  epidemiological information. Fact Sheet for Patients:   StrictlyIdeas.no Fact Sheet for Healthcare Providers: BankingDealers.co.za This test is not yet approved or cleared  by the [place] FDA and has been authorized for detection and/or diagnosis of SARS-CoV-2 by FDA under an Emergency Use Authorization (EUA).  This EUA will remain in effect (meaning this test can be used) for the duration of the COVID-19 declaration under Section 564(b)(1) of the Act, 21 U.S.C. section 360bbb-3(b)(1), unless the authorization is terminated or revoked sooner. Performed at [facility], [place] [address], [place], [place] [address]      Labs: Basic Metabolic Panel: Recent Labs  Lab 02/02/20 1721 02/02/20 1727 02/03/20 0246 02/04/20 0148 02/05/20 0303 02/06/20 0251 02/07/20 0305  NA   < >  --  139 139 139 140 137  K   < >  --  3.3* 3.8 3.7 3.6 3.6  CL   < >  --  107 109 110 109 106  CO2   < >  --  22 22 22 23 23   GLUCOSE   < >  --  146* 109* 97 94 91  BUN   < >  --  32* 38* 35* 34* 34*  CREATININE   < >  --  3.26* 3.57* 3.32* 3.40* 3.53*  CALCIUM   < >  --  8.8* 8.9 9.0 9.0 9.0  MG  --  1.5*  --  1.6* 1.6* 1.8 1.7   < > = values in this interval not displayed.   Liver Function Tests: No results for input(s): AST, ALT, ALKPHOS, BILITOT, PROT, ALBUMIN in the last 168 hours. No results for input(s): LIPASE, AMYLASE in the last 168 hours. No results for input(s): AMMONIA in the last 168 hours. CBC: Recent Labs  Lab 02/02/20 1721 02/02/20 1721 02/03/20 0246 02/04/20 0148 02/05/20 0303 02/06/20 0251 02/07/20 0305  WBC 4.1   < > 5.7 3.1* 3.4* 2.9* 3.4*  NEUTROABS 3.1  --   --   --   --   --   --   HGB 9.9*   < > 9.6* 8.9* 9.7* 9.1* 9.5*  HCT 30.3*   < > 29.1* 27.2* 29.4* 27.2* 29.0*  MCV 92.4   < > 90.7 92.2 91.3 91.9 91.8  PLT 154   < > 151 137* 145* 137* 130*   < > = values in this interval not displayed.   Cardiac  Enzymes: Recent Labs  Lab 02/04/20 0148  CKTOTAL 103   BNP: BNP (last 3 results) No results for input(s): BNP in the last 8760 hours.  ProBNP (last 3 results) No results for input(s): PROBNP in the last 8760 hours.  CBG: Recent Labs  Lab 02/02/20 1720 02/02/20 2211 02/04/20 0540 02/05/20 0359 02/06/20 0429  GLUCAP 108* 109* 73 86 85       Signed:  Fayrene Helper MD.  [facility] 02/07/2020, 7:20 PM

## 2021-12-13 ENCOUNTER — Other Ambulatory Visit: Payer: Self-pay | Admitting: Surgery

## 2021-12-13 DIAGNOSIS — E213 Hyperparathyroidism, unspecified: Secondary | ICD-10-CM

## 2021-12-22 ENCOUNTER — Ambulatory Visit
Admission: RE | Admit: 2021-12-22 | Discharge: 2021-12-22 | Disposition: A | Discharge status: Home / Self Care | Payer: Medicare PPO | Source: Ambulatory Visit | Attending: Surgery | Admitting: Surgery

## 2021-12-22 DIAGNOSIS — E213 Hyperparathyroidism, unspecified: Secondary | ICD-10-CM

## 2021-12-26 ENCOUNTER — Ambulatory Visit: Payer: Self-pay | Admitting: Surgery

## 2021-12-26 NOTE — Progress Notes (Signed)
USN confirms a left inferior adenoma, consistent with nuclear results and CT scan.  Labs show calcium of 10.2 and PTH of 281. ? ?Will plan minimally invasive left inferior parathyroidectomy as an outpatient procedure at [facility].  Will enter orders today. ? ?Claiborne Billings - please forward orders to schedulers and notify patient as he is not using MyChart. ? ?Armandina Gemma, MD ?[facility] ?A [facility] practice ?Office: [phone] ? ?

## 2021-12-27 ENCOUNTER — Encounter (HOSPITAL_COMMUNITY): Payer: Self-pay

## 2021-12-29 NOTE — Patient Instructions (Addendum)
DUE TO COVID-19 ONLY TWO VISITORS  (aged 75 and older)  ARE ALLOWED TO COME WITH YOU AND STAY IN THE WAITING ROOM ONLY DURING PRE OP AND PROCEDURE.   ?**NO VISITORS ARE ALLOWED IN THE SHORT STAY AREA OR RECOVERY ROOM!!** ? ? ? Your procedure is scheduled on: 01-03-22 ? ? Report to [facility] Main Entrance ? ?  Report to admitting at      [place] AM ? ? Call this number if you have problems the morning of surgery [phone] ? ? Do not eat food :After Midnight. ? ? After Midnight you may have the following liquids until __0630____ AM/ DAY OF SURGERY ? ?Water ?Black Coffee (sugar ok, NO MILK/CREAM OR CREAMERS)  ?Tea (sugar ok, NO MILK/CREAM OR CREAMERS) regular and decaf                             ?Plain Jell-O (NO RED)                                           ?Fruit ices (not with fruit pulp, NO RED)                                     ?Popsicles (NO RED)                                                                  ?Juice: apple, WHITE grape, WHITE cranberry ?Sports drinks like Gatorade (NO RED) ?Clear broth(vegetable,chicken,beef) ? ?             ? ? ?  ?       If you have questions, please contact your surgeon?s office. ? ? ?FOLLOW  ANY ADDITIONAL PRE OP INSTRUCTIONS YOU RECEIVED FROM YOUR SURGEON'S OFFICE!!! ?  ?  ?Oral Hygiene is also important to reduce your risk of infection.                                    ?Remember - BRUSH YOUR TEETH THE MORNING OF SURGERY WITH YOUR REGULAR TOOTHPASTE ? ? Do NOT smoke after Midnight ? ? Take these medicines the morning of surgery with A SIP OF WATER: inhalers and bring them with you in case you need them ? ?DO NOT TAKE ANY ORAL DIABETIC MEDICATIONS DAY OF YOUR SURGERY ? ?Bring CPAP mask and tubing day of surgery. ?                  ?           You may not have any metal on your body including hair pins, jewelry, and body piercing ? ?           Do not wear lotions, powders, perfumes/cologne, or deodorant ?  ? ?            Men may shave face and neck. ? ? Do not  bring valuables to the hospital. [place] NOT ?  RESPONSIBLE   FOR VALUABLES. ? ? Contacts, dentures or bridgework may not be worn into surgery. ? ?  ?  ? Patients discharged on the day of surgery will not be allowed to drive home.  Someone NEEDS to stay with you for the first 24 hours after anesthesia. ? ? ?            Please read over the following fact sheets you were given: IF [place] [phone] ? ? ?   [place] - Preparing for Surgery ?Before surgery, you can play an important role.  Because skin is not sterile, your skin needs to be as free of germs as possible.  You can reduce the number of germs on your skin by washing with CHG (chlorahexidine gluconate) soap before surgery.  CHG is an antiseptic cleaner which kills germs and bonds with the skin to continue killing germs even after washing. ?Please DO NOT use if you have an allergy to CHG or antibacterial soaps.  If your skin becomes reddened/irritated stop using the CHG and inform your nurse when you arrive at Short Stay. ?Do not shave (including legs and underarms) for at least 48 hours prior to the first CHG shower.  You may shave your face/neck. ?Please follow these instructions carefully: ? 1.  Shower with CHG Soap the night before surgery and the  morning of Surgery. ? 2.  If you choose to wash your hair, wash your hair first as usual with your  normal  shampoo. ? 3.  After you shampoo, rinse your hair and body thoroughly to remove the  shampoo.                           4.  Use CHG as you would any other liquid soap.  You can apply chg directly  to the skin and wash  ?                     Gently with a scrungie or clean washcloth. ? 5.  Apply the CHG Soap to your body ONLY FROM THE NECK DOWN.   Do not use on face/ open      ?                     Wound or open sores. Avoid contact with eyes, ears mouth and genitals (private parts).  ?                     Production manager,  Genitals  (private parts) with your normal soap. ?            6.  Wash thoroughly, paying special attention to the area where your surgery  will be performed. ? 7.  Thoroughly rinse your body with warm water from the neck down. ? 8.  DO NOT shower/wash with your normal soap after using and rinsing off  the CHG Soap. ?               9.  Pat yourself dry with a clean towel. ?           10.  Wear clean pajamas. ?           11.  Place clean sheets on your bed the night of your first shower and do not  sleep with pets. ?Day of Surgery : ?Do not apply any lotions/deodorants the  morning of surgery.  Please wear clean clothes to the hospital/surgery center. ? ?FAILURE TO FOLLOW THESE INSTRUCTIONS MAY RESULT IN THE CANCELLATION OF YOUR SURGERY ?PATIENT SIGNATURE_________________________________ ? ?NURSE SIGNATURE__________________________________ ? ?________________________________________________________________________  ?

## 2021-12-30 ENCOUNTER — Other Ambulatory Visit: Payer: Self-pay

## 2021-12-30 ENCOUNTER — Encounter (HOSPITAL_COMMUNITY)
Admission: RE | Admit: 2021-12-30 | Discharge: 2021-12-30 | Disposition: A | Discharge status: Home / Self Care | Payer: Medicare PPO | Source: Ambulatory Visit | Attending: Surgery | Admitting: Surgery

## 2021-12-30 ENCOUNTER — Encounter (HOSPITAL_COMMUNITY): Payer: Self-pay

## 2021-12-30 VITALS — BP 158/80 | HR 62 | Temp 98.2°F | Resp 16 | Ht 76.0 in | Wt 155.0 lb

## 2021-12-30 DIAGNOSIS — E11319 Type 2 diabetes mellitus with unspecified diabetic retinopathy without macular edema: Secondary | ICD-10-CM | POA: Insufficient documentation

## 2021-12-30 DIAGNOSIS — G4733 Obstructive sleep apnea (adult) (pediatric): Secondary | ICD-10-CM | POA: Diagnosis not present

## 2021-12-30 DIAGNOSIS — I120 Hypertensive chronic kidney disease with stage 5 chronic kidney disease or end stage renal disease: Secondary | ICD-10-CM | POA: Insufficient documentation

## 2021-12-30 DIAGNOSIS — E1122 Type 2 diabetes mellitus with diabetic chronic kidney disease: Secondary | ICD-10-CM | POA: Diagnosis not present

## 2021-12-30 DIAGNOSIS — E1159 Type 2 diabetes mellitus with other circulatory complications: Secondary | ICD-10-CM | POA: Diagnosis not present

## 2021-12-30 DIAGNOSIS — I7121 Aneurysm of the ascending aorta, without rupture: Secondary | ICD-10-CM | POA: Insufficient documentation

## 2021-12-30 DIAGNOSIS — N185 Chronic kidney disease, stage 5: Secondary | ICD-10-CM | POA: Insufficient documentation

## 2021-12-30 DIAGNOSIS — E210 Primary hyperparathyroidism: Secondary | ICD-10-CM | POA: Diagnosis not present

## 2021-12-30 DIAGNOSIS — L97509 Non-pressure chronic ulcer of other part of unspecified foot with unspecified severity: Secondary | ICD-10-CM | POA: Insufficient documentation

## 2021-12-30 DIAGNOSIS — I714 Abdominal aortic aneurysm, without rupture, unspecified: Secondary | ICD-10-CM | POA: Diagnosis not present

## 2021-12-30 DIAGNOSIS — I152 Hypertension secondary to endocrine disorders: Secondary | ICD-10-CM

## 2021-12-30 DIAGNOSIS — Z01818 Encounter for other preprocedural examination: Secondary | ICD-10-CM | POA: Insufficient documentation

## 2021-12-30 DIAGNOSIS — E1140 Type 2 diabetes mellitus with diabetic neuropathy, unspecified: Secondary | ICD-10-CM | POA: Insufficient documentation

## 2021-12-30 DIAGNOSIS — E11621 Type 2 diabetes mellitus with foot ulcer: Secondary | ICD-10-CM | POA: Diagnosis not present

## 2021-12-30 HISTORY — DX: Chronic kidney disease, unspecified: N18.9

## 2021-12-30 HISTORY — DX: Depression, unspecified: F32.A

## 2021-12-30 HISTORY — DX: Myoneural disorder, unspecified: G70.9

## 2021-12-30 HISTORY — DX: Aortic aneurysm of unspecified site, without rupture: I71.9

## 2021-12-30 LAB — CBC
HCT: 31.8 % — ABNORMAL LOW (ref 39.0–52.0)
Hemoglobin: 10.5 g/dL — ABNORMAL LOW (ref 13.0–17.0)
MCH: 30.6 pg (ref 26.0–34.0)
MCHC: 33 g/dL (ref 30.0–36.0)
MCV: 92.7 fL (ref 80.0–100.0)
Platelets: 152 10*3/uL (ref 150–400)
RBC: 3.43 MIL/uL — ABNORMAL LOW (ref 4.22–5.81)
RDW: 13.3 % (ref 11.5–15.5)
WBC: 3.1 10*3/uL — ABNORMAL LOW (ref 4.0–10.5)
nRBC: 0 % (ref 0.0–0.2)

## 2021-12-30 LAB — BASIC METABOLIC PANEL
Anion gap: 5 (ref 5–15)
BUN: 41 mg/dL — ABNORMAL HIGH (ref 8–23)
CO2: 24 mmol/L (ref 22–32)
Calcium: 9.6 mg/dL (ref 8.9–10.3)
Chloride: 110 mmol/L (ref 98–111)
Creatinine, Ser: 4.04 mg/dL — ABNORMAL HIGH (ref 0.61–1.24)
GFR, Estimated: 15 mL/min — ABNORMAL LOW (ref >60)
Glucose, Bld: 98 mg/dL (ref 70–99)
Potassium: 4.3 mmol/L (ref 3.5–5.1)
Sodium: 139 mmol/L (ref 135–145)

## 2021-12-30 LAB — HEMOGLOBIN A1C
Hgb A1c MFr Bld: 4.7 % — ABNORMAL LOW (ref 4.8–5.6)
Mean Plasma Glucose: 88.19 mg/dL

## 2021-12-30 LAB — GLUCOSE, CAPILLARY: Glucose-Capillary: 95 mg/dL (ref 70–99)

## 2021-12-30 NOTE — Progress Notes (Addendum)
PCP - Dr. Wilfred Lacy Jule Ser ?Cardiologist - also sees heart doctor there ? ?PPM/ICD -  ?Device Orders -  ?Rep Notified -  ? ?Chest x-ray -  ?EKG -  ?Stress Test -  ?ECHO -  ?Cardiac Cath -  ? ?Sleep Study -  ?CPAP -  ? ?Fasting Blood Sugar -  ?Checks Blood Sugar __0___ ? ?Blood Thinner Instructions: Plavix not taken in months per pt. [facility]. Pt. Unaware if [facility] knows he is not taking it  ?  ?Aspirin Instructions: ? ?ERAS Protcol - ?PRE-SURGERY Ensure or G2-  ? ? ?COVID vaccine -x3  pfizer ? 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 ?Patient denies shortness of breath, fever, cough and chest pain at PAT appointment ? ? ?All instructions explained to the patient, with a verbal understanding of the material. Patient agrees to go over the instructions while at home for a better understanding. Patient also instructed to self quarantine after being tested for COVID-19. The opportunity to ask questions was provided. ?  ?

## 2022-01-02 NOTE — Progress Notes (Signed)
Anesthesia Chart Review ? ? Case: [id number] Date/Time: 01/03/22 0915  ? Procedure: LEFT INFERIOR PARATHYROIDECTOMY (Left)  ? Anesthesia type: General  ? Pre-op diagnosis: PRIMARY HYPERPARATHYROIDISM  ? Location: [facility] ROOM 04 / [facility]  ? Surgeons: Armandina Gemma, MD  ? ?  ? ? ?DISCUSSION:75 y.o. never smoker with h/o HTN, OSA w/cpap, DM II, AAA, CKD Stage V followed by nephrology creatinine stable (baseline creatinine 4-4.3), primary hyperparathyroidism scheduled for above procedure 01/03/22 with Dr. Armandina Gemma.  ? ?CT Chest 12/01/2021 with Stable aneurysmal dilatation of the ascending aorta, now measures approximately 4.2 x 4.6 cm.  ? ?Pt seen by nephrology 11/07/2021. Stable at this visit.  ? ?Anticipate pt can proceed with planned procedure barring acute status change.   ?VS: BP (!) 158/80   Pulse 62   Temp 36.8 ?C (Oral)   Resp 16   Ht '6\' 4"'$  (1.93 m)   Wt 70.3 kg   SpO2 100%   BMI 18.87 kg/m?  ? ?PROVIDERS: ?Clinic, [facility] ? ? ?LABS: Labs reviewed: Acceptable for surgery. ?(all labs ordered are listed, but only abnormal results are displayed) ? ?Labs Reviewed  ?HEMOGLOBIN A1C - Abnormal; Notable for the following components:  ?    Result Value  ? Hgb A1c MFr Bld 4.7 (*)   ? All other components within normal limits  ?BASIC METABOLIC PANEL - Abnormal; Notable for the following components:  ? BUN 41 (*)   ? Creatinine, Ser 4.04 (*)   ? GFR, Estimated 15 (*)   ? All other components within normal limits  ?CBC - Abnormal; Notable for the following components:  ? WBC 3.1 (*)   ? RBC 3.43 (*)   ? Hemoglobin 10.5 (*)   ? HCT 31.8 (*)   ? All other components within normal limits  ?GLUCOSE, CAPILLARY  ? ? ? ?IMAGES: ? ? ?EKG: ?12/30/2021 ?Rate 64 bpm  ?Normal sinus rhythm ?Minimal voltage criteria for LVH, may be normal variant ( Cornell product ) ?Nonspecific ST abnormality ?Borderline ECG ?When compared with ECG of 04-Feb-2020 11:46, ?PREVIOUS ECG IS PRESENT ?No significant change since last tracing ? ?CV: ?Echo  02/03/20 ?1. Left ventricular ejection fraction, by estimation, is 50 to 55%. The  ?left ventricle has low normal function. The left ventricle has no regional  ?wall motion abnormalities. There is moderate concentric left ventricular  ?hypertrophy. Left ventricular  ?diastolic parameters are consistent with Grade I diastolic dysfunction  ?(impaired relaxation).  ? 2. Right ventricular systolic function is normal. The right ventricular  ?size is normal. Tricuspid regurgitation signal is inadequate for assessing  ?PA pressure.  ? 3. Left atrial size was moderately dilated.  ? 4. Right atrial size was mildly dilated.  ? 5. The mitral valve is normal in structure. Mild mitral valve  ?regurgitation. No evidence of mitral stenosis.  ? 6. The aortic valve is tricuspid. Aortic valve regurgitation is mild.  ?Mild aortic valve sclerosis is present, with no evidence of aortic valve  ?stenosis.  ? 7. There is a line visualized within the lumen of the ascendening aorta.  ?Given its location, motion, and visualization in other views, this is  ?highly suggestive of artifact. However, if there is clinical concern for  ?dissection, this cannot be determine  ?given the images, and would recommend dedicated aorta imaging with CT.[name]  ?Aortic dilatation noted. There is mild dilatation of the ascending aorta  ?measuring 40 mm.  ? 8. The inferior vena cava is normal in size with greater than  50%  ?respiratory variability, suggesting right atrial pressure of 3 mmHg.  ?Past Medical History:  ?Diagnosis Date  ? Anemia   ? Aneurysm of aorta (HCC)   ? "pt thinks" monitored by [facility] in [place]  ? Arthritis   ? "right arm, right ankle, right side" (10/30/2014)  ? Chronic kidney disease   ? Colon polyps   ? Depression   ? Diabetic neuropathy ([place])   ? Diabetic retinopathy ([place])   ? H/O agent Orange exposure   ? Headache   ? "@ least 3 times/wk" (10/30/2014)  ? History of blood transfusion 10/30/2014  ? hematochezia  ? History of gout   ?  Hypertension   ? Neuromuscular disorder ([place])   ? Diabetic neuropathy  ? OSA on CPAP   ? does not wear  ? PTSD (post-traumatic stress disorder)   ? "service related"  ? Stroke [facility]) 2014  ? left extremity deficits; facial left  ? Type II diabetes mellitus ([place])   ? hx of no meds  ? ? ?Past Surgical History:  ?Procedure Laterality Date  ? BONE GRAFT HIP ILIAC CREST Left ~ 1976  ? CATARACT EXTRACTION W/ INTRAOCULAR LENS  IMPLANT, BILATERAL Bilateral 2014-2015  ? EP IMPLANTABLE DEVICE N/A 12/24/2015  ? Procedure: Loop Recorder Insertion;  Surgeon: Thompson Grayer, MD;  Location: [facility];  Service: Cardiovascular;  Laterality: N/A;  ? EYE SURGERY    ? bleed behind eye  ? TEE WITHOUT CARDIOVERSION N/A 12/07/2017  ? Procedure: TRANSESOPHAGEAL ECHOCARDIOGRAM (TEE);  Surgeon: Fay Records, MD;  Location: [facility];  Service: Cardiovascular;  Laterality: N/A;  ? TUMOR REMOVAL Right ~ 1976  ? "arm; had to take bone left hip to add to the repair"   elbow  ? ? ?MEDICATIONS: ? acetaminophen (TYLENOL) 500 MG tablet  ? albuterol (PROVENTIL HFA;VENTOLIN HFA) 108 (90 Base) MCG/ACT inhaler  ? amLODipine (NORVASC) 10 MG tablet  ? ammonium lactate (LAC-HYDRIN) 12 % lotion  ? Aspirin-Acetaminophen-Caffeine (BC FAST PAIN RELIEF MAX STR) 600-459-97 MG PACK  ? atorvastatin (LIPITOR) 40 MG tablet  ? budesonide-formoterol (SYMBICORT) 80-4.5 MCG/ACT inhaler  ? carvedilol (COREG) 25 MG tablet  ? Cholecalciferol (VITAMIN D3) 10 MCG (400 UNIT) tablet  ? cloNIDine (CATAPRES - DOSED IN MG/24 HR) 0.3 mg/24hr patch  ? clopidogrel (PLAVIX) 75 MG tablet  ? dorzolamide-timolol (COSOPT) 22.3-6.8 MG/ML ophthalmic solution  ? escitalopram (LEXAPRO) 10 MG tablet  ? ferrous sulfate 325 (65 FE) MG tablet  ? finasteride (PROSCAR) 5 MG tablet  ? fluticasone (FLONASE) 50 MCG/ACT nasal spray  ? gabapentin (NEURONTIN) 300 MG capsule  ? ketotifen (ZADITOR) 0.025 % ophthalmic solution  ? latanoprost (XALATAN) 0.005 % ophthalmic solution  ? Melatonin 3  MG TABS  ? oxybutynin (DITROPAN-XL) 5 MG 24 hr tablet  ? terbinafine (LAMISIL) 1 % cream  ? tolnaftate (TINACTIN) 1 % powder  ? trospium (SANCTURA) 20 MG tablet  ? ?No current facility-administered medications for this encounter.  ? ?Konrad Felix Ward, PA-C ?[facility] ?[phone] ? ? ? ? ? ? ? ?

## 2022-01-02 NOTE — Anesthesia Preprocedure Evaluation (Addendum)
Anesthesia Evaluation  ?Patient identified by MRN, date of birth, ID band ?Patient awake ? ? ? ?Reviewed: ?Allergy & Precautions, NPO status , Patient's Chart, lab work & pertinent test results ? ?History of Anesthesia Complications ?Negative for: history of anesthetic complications ? ?Airway ?Mallampati: III ? ?TM Distance: >3 FB ?Neck ROM: Full ? ?Mouth opening: Limited Mouth Opening ? Dental ? ?(+) Dental Advisory Given, Partial Upper,  ?  ?Pulmonary ?neg shortness of breath, sleep apnea and Continuous Positive Airway Pressure Ventilation , neg recent URI,  ?  ?breath sounds clear to auscultation ? ? ? ? ? ? Cardiovascular ?hypertension, Pt. on medications ?(-) angina+ Peripheral Vascular Disease  ?(-) Past MI  ?Rhythm:Regular  ?1. Left ventricular ejection fraction, by estimation, is 50 to 55%. The  ?left ventricle has low normal function. The left ventricle has no regional  ?wall motion abnormalities. There is moderate concentric left ventricular  ?hypertrophy. Left ventricular  ?diastolic parameters are consistent with Grade I diastolic dysfunction  ?(impaired relaxation).  ??2. Right ventricular systolic function is normal. The right ventricular  ?size is normal. Tricuspid regurgitation signal is inadequate for assessing  ?PA pressure.  ??3. Left atrial size was moderately dilated.  ??4. Right atrial size was mildly dilated.  ??5. The mitral valve is normal in structure. Mild mitral valve  ?regurgitation. No evidence of mitral stenosis.  ??6. The aortic valve is tricuspid. Aortic valve regurgitation is mild.  ?Mild aortic valve sclerosis is present, with no evidence of aortic valve  ?stenosis.  ??7. There is a line visualized within the lumen of the ascendening aorta.  ?Given its location, motion, and visualization in other views, this is  ?highly suggestive of artifact. However, if there is clinical concern for  ?dissection, this cannot be determine  ?given the images, and  would recommend dedicated aorta imaging with CT.[name]  ?Aortic dilatation noted. There is mild dilatation of the ascending aorta  ?measuring 40 mm.  ??8. The inferior vena cava is normal in size with greater than 50%  ?respiratory variability, suggesting right atrial pressure of 3 mmHg.  ?  ?Neuro/Psych ?PSYCHIATRIC DISORDERS Anxiety Depression 2015 cva, right sided symptoms ? Neuromuscular disease CVA   ? GI/Hepatic ?negative GI ROS, Neg liver ROS,   ?Endo/Other  ?diabetesLab Results ?     Component                Value               Date                 ?     HGBA1C                   4.7 (L)             12/30/2021           ? ? ?PRIMARY HYPERPARATHYROIDISM ? Renal/GU ?CRFRenal diseaseLab Results ?     Component                Value               Date                 ?     CREATININE               4.04 (H)            12/30/2021           ?  ? ?  ?  Musculoskeletal ? ? Abdominal ?  ?Peds ? Hematology ? ?(+) Blood dyscrasia, anemia , Lab Results ?     Component                Value               Date                 ?     WBC                      3.1 (L)             12/30/2021           ?     HGB                      10.5 (L)            12/30/2021           ?     HCT                      31.8 (L)            12/30/2021           ?     MCV                      92.7                12/30/2021           ?     PLT                      152                 12/30/2021           ?   ?Anesthesia Other Findings ? ? Reproductive/Obstetrics ? ?  ? ? ? ? ? ? ? ? ? ? ? ? ? ?  ?  ? ? ? ? ? ? ? ?Anesthesia Physical ?Anesthesia Plan ? ?ASA: 3 ? ?Anesthesia Plan: General  ? ?Post-op Pain Management: Minimal or no pain anticipated  ? ?Induction: Intravenous ? ?PONV Risk Score and Plan: 2 and Ondansetron and Dexamethasone ? ?Airway Management Planned: Oral ETT ? ?Additional Equipment: None ? ?Intra-op Plan:  ? ?Post-operative Plan: Extubation in OR ? ?Informed Consent: I have reviewed the patients History and Physical, chart, labs and discussed  the procedure including the risks, benefits and alternatives for the proposed anesthesia with the patient or authorized representative who has indicated his/her understanding and acceptance.  ? ? ? ?Dental advisory given ? ?Plan Discussed with: CRNA ? ?Anesthesia Plan Comments: (See PAT note 12/30/2021)  ? ? ? ? ? ?Anesthesia Quick Evaluation ? ?

## 2022-01-03 ENCOUNTER — Other Ambulatory Visit: Payer: Self-pay

## 2022-01-03 ENCOUNTER — Ambulatory Visit (HOSPITAL_BASED_OUTPATIENT_CLINIC_OR_DEPARTMENT_OTHER): Payer: Medicare PPO | Admitting: Anesthesiology

## 2022-01-03 ENCOUNTER — Encounter (HOSPITAL_COMMUNITY)
Admission: RE | Disposition: A | Discharge status: Home / Self Care | Payer: Self-pay | Source: Home / Self Care | Attending: Surgery

## 2022-01-03 ENCOUNTER — Encounter (HOSPITAL_COMMUNITY): Payer: Self-pay | Admitting: Surgery

## 2022-01-03 ENCOUNTER — Ambulatory Visit (HOSPITAL_COMMUNITY): Payer: Medicare PPO | Admitting: Physician Assistant

## 2022-01-03 ENCOUNTER — Ambulatory Visit (HOSPITAL_COMMUNITY)
Admission: RE | Admit: 2022-01-03 | Discharge: 2022-01-03 | Disposition: A | Discharge status: Home / Self Care | Payer: Medicare PPO | Attending: Surgery | Admitting: Surgery

## 2022-01-03 DIAGNOSIS — F419 Anxiety disorder, unspecified: Secondary | ICD-10-CM | POA: Diagnosis not present

## 2022-01-03 DIAGNOSIS — D351 Benign neoplasm of parathyroid gland: Secondary | ICD-10-CM | POA: Diagnosis not present

## 2022-01-03 DIAGNOSIS — N189 Chronic kidney disease, unspecified: Secondary | ICD-10-CM | POA: Diagnosis not present

## 2022-01-03 DIAGNOSIS — I129 Hypertensive chronic kidney disease with stage 1 through stage 4 chronic kidney disease, or unspecified chronic kidney disease: Secondary | ICD-10-CM | POA: Insufficient documentation

## 2022-01-03 DIAGNOSIS — Z9989 Dependence on other enabling machines and devices: Secondary | ICD-10-CM

## 2022-01-03 DIAGNOSIS — F32A Depression, unspecified: Secondary | ICD-10-CM | POA: Insufficient documentation

## 2022-01-03 DIAGNOSIS — E210 Primary hyperparathyroidism: Secondary | ICD-10-CM | POA: Diagnosis not present

## 2022-01-03 DIAGNOSIS — E1122 Type 2 diabetes mellitus with diabetic chronic kidney disease: Secondary | ICD-10-CM | POA: Insufficient documentation

## 2022-01-03 DIAGNOSIS — I351 Nonrheumatic aortic (valve) insufficiency: Secondary | ICD-10-CM | POA: Insufficient documentation

## 2022-01-03 DIAGNOSIS — G4733 Obstructive sleep apnea (adult) (pediatric): Secondary | ICD-10-CM

## 2022-01-03 DIAGNOSIS — E11621 Type 2 diabetes mellitus with foot ulcer: Secondary | ICD-10-CM

## 2022-01-03 DIAGNOSIS — F418 Other specified anxiety disorders: Secondary | ICD-10-CM

## 2022-01-03 DIAGNOSIS — I152 Hypertension secondary to endocrine disorders: Secondary | ICD-10-CM

## 2022-01-03 HISTORY — PX: PARATHYROIDECTOMY: SHX19

## 2022-01-03 LAB — GLUCOSE, CAPILLARY
Glucose-Capillary: 86 mg/dL (ref 70–99)
Glucose-Capillary: 99 mg/dL (ref 70–99)

## 2022-01-03 SURGERY — PARATHYROIDECTOMY
Anesthesia: General | Site: Neck | Laterality: Left

## 2022-01-03 MED ORDER — LIDOCAINE HCL (CARDIAC) PF 100 MG/5ML IV SOSY
PREFILLED_SYRINGE | INTRAVENOUS | Status: DC | PRN
  Administered 2022-01-03: 60 mg via INTRAVENOUS

## 2022-01-03 MED ORDER — FENTANYL CITRATE (PF) 100 MCG/2ML IJ SOLN
INTRAMUSCULAR | Status: DC | PRN
  Administered 2022-01-03: 50 ug via INTRAVENOUS

## 2022-01-03 MED ORDER — CEFAZOLIN SODIUM-DEXTROSE 2-4 GM/100ML-% IV SOLN
2.0000 g | INTRAVENOUS | Status: AC
  Administered 2022-01-03: 2 g via INTRAVENOUS
  Filled 2022-01-03: qty 100

## 2022-01-03 MED ORDER — SUGAMMADEX SODIUM 200 MG/2ML IV SOLN
INTRAVENOUS | Status: DC | PRN
  Administered 2022-01-03: 200 mg via INTRAVENOUS

## 2022-01-03 MED ORDER — PROPOFOL 10 MG/ML IV BOLUS
INTRAVENOUS | Status: DC | PRN
  Administered 2022-01-03: 130 mg via INTRAVENOUS

## 2022-01-03 MED ORDER — BUPIVACAINE HCL 0.25 % IJ SOLN
INTRAMUSCULAR | Status: AC
  Filled 2022-01-03: qty 1

## 2022-01-03 MED ORDER — CHLORHEXIDINE GLUCONATE CLOTH 2 % EX PADS: 6.0000 | MEDICATED_PAD | Freq: Once | CUTANEOUS | Status: DC

## 2022-01-03 MED ORDER — ROCURONIUM BROMIDE 10 MG/ML (PF) SYRINGE
PREFILLED_SYRINGE | INTRAVENOUS | Status: DC | PRN
  Administered 2022-01-03: 50 mg via INTRAVENOUS

## 2022-01-03 MED ORDER — 0.9 % SODIUM CHLORIDE (POUR BTL) OPTIME
TOPICAL | Status: DC | PRN
  Administered 2022-01-03: 1000 mL

## 2022-01-03 MED ORDER — LIDOCAINE HCL (PF) 2 % IJ SOLN
INTRAMUSCULAR | Status: AC
  Filled 2022-01-03: qty 5

## 2022-01-03 MED ORDER — FENTANYL CITRATE PF 50 MCG/ML IJ SOSY: 25.0000 ug | PREFILLED_SYRINGE | INTRAMUSCULAR | Status: DC | PRN

## 2022-01-03 MED ORDER — ONDANSETRON HCL 4 MG/2ML IJ SOLN
INTRAMUSCULAR | Status: AC
  Filled 2022-01-03: qty 2

## 2022-01-03 MED ORDER — ONDANSETRON HCL 4 MG/2ML IJ SOLN
INTRAMUSCULAR | Status: DC | PRN
Start: 2022-01-03 — End: 2022-01-03
  Administered 2022-01-03: 4 mg via INTRAVENOUS

## 2022-01-03 MED ORDER — PROPOFOL 10 MG/ML IV BOLUS
INTRAVENOUS | Status: AC
  Filled 2022-01-03: qty 20

## 2022-01-03 MED ORDER — OXYCODONE HCL 5 MG PO TABS: 5.0000 mg | ORAL_TABLET | Freq: Once | ORAL | Status: DC | PRN

## 2022-01-03 MED ORDER — DEXAMETHASONE SODIUM PHOSPHATE 10 MG/ML IJ SOLN
INTRAMUSCULAR | Status: DC | PRN
  Administered 2022-01-03: 10 mg via INTRAVENOUS

## 2022-01-03 MED ORDER — LACTATED RINGERS IV SOLN: INTRAVENOUS | Status: DC

## 2022-01-03 MED ORDER — ORAL CARE MOUTH RINSE: 15.0000 mL | Freq: Once | OROMUCOSAL | Status: AC

## 2022-01-03 MED ORDER — BUPIVACAINE HCL 0.25 % IJ SOLN
INTRAMUSCULAR | Status: DC | PRN
  Administered 2022-01-03: 10 mL

## 2022-01-03 MED ORDER — OXYCODONE HCL 5 MG/5ML PO SOLN: 5.0000 mg | Freq: Once | ORAL | Status: DC | PRN

## 2022-01-03 MED ORDER — TRAMADOL HCL 50 MG PO TABS: 50.0000 mg | ORAL_TABLET | Freq: Four times a day (QID) | ORAL | 0 refills | Status: AC | PRN

## 2022-01-03 MED ORDER — FENTANYL CITRATE (PF) 100 MCG/2ML IJ SOLN
INTRAMUSCULAR | Status: AC
Start: 2022-01-03 — End: ?
  Filled 2022-01-03: qty 2

## 2022-01-03 MED ORDER — EPHEDRINE SULFATE-NACL 50-0.9 MG/10ML-% IV SOSY
PREFILLED_SYRINGE | INTRAVENOUS | Status: DC | PRN
Start: 2022-01-03 — End: 2022-01-03
  Administered 2022-01-03: 10 mg via INTRAVENOUS

## 2022-01-03 MED ORDER — CHLORHEXIDINE GLUCONATE 0.12 % MT SOLN
15.0000 mL | Freq: Once | OROMUCOSAL | Status: AC
  Administered 2022-01-03: 15 mL via OROMUCOSAL

## 2022-01-03 MED ORDER — DEXAMETHASONE SODIUM PHOSPHATE 10 MG/ML IJ SOLN
INTRAMUSCULAR | Status: AC
  Filled 2022-01-03: qty 1

## 2022-01-03 MED ORDER — HEMOSTATIC AGENTS (NO CHARGE) OPTIME
TOPICAL | Status: DC | PRN
Start: 2022-01-03 — End: 2022-01-03
  Administered 2022-01-03: 1 via TOPICAL

## 2022-01-03 MED ORDER — ROCURONIUM BROMIDE 10 MG/ML (PF) SYRINGE
PREFILLED_SYRINGE | INTRAVENOUS | Status: AC
  Filled 2022-01-03: qty 10

## 2022-01-03 SURGICAL SUPPLY — 37 items
ATTRACTOMAT 16X20 MAGNETIC DRP (DRAPES) ×2 IMPLANT
BAG COUNTER SPONGE SURGICOUNT (BAG) ×2 IMPLANT
BLADE SURG 15 STRL LF DISP TIS (BLADE) ×1 IMPLANT
BLADE SURG 15 STRL SS (BLADE) ×2
CHLORAPREP W/TINT 26 (MISCELLANEOUS) ×2 IMPLANT
CLIP TI MEDIUM 6 (CLIP) ×4 IMPLANT
CLIP TI WIDE RED SMALL 6 (CLIP) ×4 IMPLANT
COVER SURGICAL LIGHT HANDLE (MISCELLANEOUS) ×2 IMPLANT
DERMABOND ADVANCED (GAUZE/BANDAGES/DRESSINGS) ×1
DERMABOND ADVANCED .7 DNX12 (GAUZE/BANDAGES/DRESSINGS) ×1 IMPLANT
DRAPE LAPAROTOMY T 98X78 PEDS (DRAPES) ×2 IMPLANT
DRAPE UTILITY XL STRL (DRAPES) ×2 IMPLANT
ELECT REM PT RETURN 15FT ADLT (MISCELLANEOUS) ×2 IMPLANT
GAUZE 4X4 16PLY ~~LOC~~+RFID DBL (SPONGE) ×2 IMPLANT
GLOVE SURG ORTHO 8.0 STRL STRW (GLOVE) ×2 IMPLANT
GLOVE SURG SYN 7.5  E (GLOVE) ×3
GLOVE SURG SYN 7.5 E (GLOVE) ×3 IMPLANT
GLOVE SURG SYN 7.5 PF PI (GLOVE) ×3 IMPLANT
GOWN STRL REUS W/ TWL XL LVL3 (GOWN DISPOSABLE) ×3 IMPLANT
GOWN STRL REUS W/TWL XL LVL3 (GOWN DISPOSABLE) ×3
HEMOSTAT SURGICEL 2X4 FIBR (HEMOSTASIS) ×2 IMPLANT
ILLUMINATOR WAVEGUIDE N/F (MISCELLANEOUS) IMPLANT
KIT BASIN OR (CUSTOM PROCEDURE TRAY) ×2 IMPLANT
KIT TURNOVER KIT A (KITS) ×1 IMPLANT
NDL HYPO 25X1 1.5 SAFETY (NEEDLE) ×1 IMPLANT
NEEDLE HYPO 25X1 1.5 SAFETY (NEEDLE) ×2 IMPLANT
PACK BASIC VI WITH GOWN DISP (CUSTOM PROCEDURE TRAY) ×2 IMPLANT
PENCIL SMOKE EVACUATOR (MISCELLANEOUS) ×2 IMPLANT
SUT MNCRL AB 4-0 PS2 18 (SUTURE) ×2 IMPLANT
SUT SILK 3 0 (SUTURE) ×2
SUT SILK 3-0 18XBRD TIE 12 (SUTURE) IMPLANT
SUT VIC AB 3-0 SH 18 (SUTURE) ×2 IMPLANT
SYR BULB IRRIG 60ML STRL (SYRINGE) ×2 IMPLANT
SYR CONTROL 10ML LL (SYRINGE) ×2 IMPLANT
TOWEL OR 17X26 10 PK STRL BLUE (TOWEL DISPOSABLE) ×2 IMPLANT
TOWEL OR NON WOVEN STRL DISP B (DISPOSABLE) ×2 IMPLANT
TUBING CONNECTING 10 (TUBING) ×2 IMPLANT

## 2022-01-03 NOTE — Interval H&P Note (Signed)
History and Physical Interval Note: ? ?01/03/2022 ?8:42 AM ? ?Cody Silva  has presented today for surgery, with the diagnosis of PRIMARY HYPERPARATHYROIDISM.  The various methods of treatment have been discussed with the patient and family. After consideration of risks, benefits and other options for treatment, the patient has consented to  ? ? Procedure(s): ?LEFT INFERIOR PARATHYROIDECTOMY (Left) as a surgical intervention.  ? ? The patient's history has been reviewed, patient examined, no change in status, stable for surgery.  I have reviewed the patient's chart and labs.  Questions were answered to the patient's satisfaction.   ? ?Armandina Gemma, MD ?[facility] ?A [facility] practice ?Office: [phone] ? ? ?Armandina Gemma ? ? ?

## 2022-01-03 NOTE — Discharge Instructions (Signed)
[facility] - Dr. Angelica Frandsen  THYROID & PARATHYROID SURGERY:  POST-OP INSTRUCTIONS  Always review the instruction sheet provided by the hospital nurse at discharge.  A prescription for pain medication may be sent to your pharmacy at the time of discharge.  Take your pain medication as prescribed.  If narcotic pain medicine is not needed, then you may take acetaminophen (Tylenol) or ibuprofen (Advil) as needed for pain or soreness.  Take your normal home medications as prescribed unless otherwise directed.  If you need a refill on your pain medication, please contact the office during regular business hours.  Prescriptions will not be processed by the office after 5:00PM or on weekends.  Start with a light diet upon arrival home, such as soup and crackers or toast.  Be sure to drink plenty of fluids.  Resume your normal diet the day after surgery.  Most patients will experience some swelling and bruising on the chest and neck area.  Ice packs will help for the first 48 hours after arriving home.  Swelling and bruising will take several days to resolve.   It is common to experience some constipation after surgery.  Increasing fluid intake and taking a stool softener (Colace) will usually help to prevent this problem.  A mild laxative (Milk of Magnesia or Miralax) should be taken according to package directions if there has been no bowel movement after 48 hours.  Dermabond glue covers your incision. This seals the wound and you may shower at any time. The Dermabond will remain in place for about a week.  You may gradually remove the glue when it loosens around the edges.  If you need to loosen the Dermabond for removal, apply a layer of Vaseline to the wound for 15 minutes and then remove with a Kleenex. Your sutures are under the skin and will not show - they will dissolve on their own.  You may resume light daily activities beginning the day after discharge (such as self-care,  walking, climbing stairs), gradually increasing activities as tolerated. You may have sexual intercourse when it is comfortable. Refrain from any heavy lifting or straining until approved by your doctor. You may drive when you no longer are taking prescription pain medication, you can comfortably wear a seatbelt, and you can safely maneuver your car and apply the brakes.  You will see your doctor in the office for a follow-up appointment approximately three weeks after your surgery.  Make sure that you call for this appointment within a day or two after you arrive home to insure a convenient appointment time. Please have any requested laboratory tests performed a few days prior to your office visit so that the results will be available at your follow up appointment.  WHEN TO CALL THE [facility] OFFICE: -- Fever greater than 101.5 -- Inability to urinate -- Nausea and/or vomiting - persistent -- Extreme swelling or bruising -- Continued bleeding from incision -- Increased pain, redness, or drainage from the incision -- Difficulty swallowing or breathing -- Muscle cramping or spasms -- Numbness or tingling in hands or around lips  The clinic staff is available to answer your questions during regular business hours.  Please don't hesitate to call and ask to speak to one of the nurses if you have concerns.  [facility] OFFICE: [phone] (24 hours)  Please sign up for MyChart accounts. This will allow you to communicate directly with my nurse or myself without having to call the office. It will also allow you   to view your test results. You will need to enroll in MyChart for my office ([facility]) and for the hospital ([facility]).  Jidenna Figgs, MD [facility] A [facility] practice 

## 2022-01-03 NOTE — Transfer of Care (Signed)
Immediate Anesthesia Transfer of Care Note ? ?Patient: Cody Silva ? ?Procedure(s) Performed: LEFT INFERIOR PARATHYROIDECTOMY (Left: Neck) ? ?Patient Location: PACU ? ?Anesthesia Type:General ? ?Level of Consciousness: sedated ? ?Airway & Oxygen Therapy: Patient Spontanous Breathing and Patient connected to face mask oxygen ? ?Post-op Assessment: Report given to RN and Post -op Vital signs reviewed and stable ? ?Post vital signs: Reviewed and stable ? ?Last Vitals:  ?Vitals Value Taken Time  ?BP 153/72 01/03/22 1023  ?Temp    ?Pulse 53 01/03/22 1024  ?Resp 10 01/03/22 1024  ?SpO2 100 % 01/03/22 1024  ?Vitals shown include unvalidated device data. ? ?Last Pain:  ?Vitals:  ? 01/03/22 0805  ?TempSrc: Oral  ?PainSc:   ?   ? ?  ? ?Complications: No notable events documented. ?

## 2022-01-03 NOTE — H&P (Signed)
? ? ? ?REFERRING PHYSICIAN: [facility] ? ?PROVIDER: Athalene Kolle Charlotta Newton, MD ? ? ?Chief Complaint: New Consultation (hyperparathyroidism) ? ?History of Present Illness: ? ?Patient is referred by his nephrologist, Dr. Geraldine Solar, at the [facility] for surgical evaluation and recommendations regarding suspected hyperparathyroidism. Patient has been noted to have abnormal laboratory studies. He has chronic kidney disease with an elevated serum creatinine level. Unfortunately I do not have additional laboratory studies for review today. I do not have a calcium level or an intact PTH level. Patient complains of fatigue. He believes he has had a bone density scan but those results are not available. Patient underwent a nuclear medicine parathyroid scan on October 14, 2021. This demonstrated a focal area of persistent uptake inferior to the left thyroid lobe and an anterior and inferior location. This correlated with an 8 mm soft tissue nodule seen on dedicated CT scan. Suspicion was raised of a possible parathyroid adenoma. Patient has had no prior surgery on the head or neck. There is no family history of parathyroid disease or other endocrine neoplasm. Patient presents today accompanied by his wife for evaluation. ? ?Review of Systems: ?A complete review of systems was obtained from the patient. I have reviewed this information and discussed as appropriate with the patient. See HPI as well for other ROS. ? ?Review of Systems  ?Constitutional: Positive for malaise/fatigue and weight loss.  ?HENT: Negative.  ?Eyes:  ?Vision loss  ?Respiratory: Negative.  ?Cardiovascular: Negative.  ?Gastrointestinal: Negative.  ?Genitourinary: Negative.  ?Musculoskeletal: Positive for joint pain.  ?Skin: Negative.  ?Neurological: Positive for tremors.  ?Endo/Heme/Allergies: Negative.  ?Psychiatric/Behavioral: Negative.  ? ? ?Medical History: ?Past Medical History:  ?Diagnosis Date  ? Chronic kidney  disease  ? Diabetes mellitus without complication (CMS-HCC)  ? History of stroke  ? Hypertension  ? Sleep apnea  ? ?Patient Active Problem List  ?Diagnosis  ? Hyperparathyroidism (CMS-HCC)  ? ?No past surgical history on file.  ? ?No Known Allergies ? ?Current Outpatient Medications on File Prior to Visit  ?Medication Sig Dispense Refill  ? amLODIPine (NORVASC) 10 MG tablet Take 1 tablet by mouth once daily  ? atorvastatin (LIPITOR) 40 MG tablet Take 1 tablet by mouth at bedtime  ? carvediloL (COREG) 25 MG tablet TAKE ONE-HALF TABLET BY MOUTH TWICE A DAY FOR HYPERTENSION  ? cholecalciferol, vitamin D3, 100 mcg (4,000 unit) Tab TAKE ONE TABLET BY MOUTH DAILY FOR VITAMIN D DEFICIENCY. TAKE ALONG WITH CALCITRIOL  ? clopidogreL (PLAVIX) 75 mg tablet TAKE ONE TABLET BY MOUTH DAILY BLOOD THINNER/ STROKE PREVENTION  ? ferrous sulfate 325 (65 FE) MG tablet TAKE ONE TABLET BY MOUTH MONDAY, WEDNESDAY AND FRIDAY WITH FOOD FOR ANEMIA  ? finasteride (PROSCAR) 5 mg tablet Take 1 tablet by mouth once daily  ? melatonin 3 mg Cap TAKE 1 TABLET/CAPSULE ('3MG'$ ) BY MOUTH AT BEDTIME FOR SLEEP  ? oxyBUTYnin (DITROPAN-XL) 5 MG XL tablet Take 5 mg by mouth once daily  ? trospium (SANCTURA) 20 mg tablet Take 20 mg by mouth 2 (two) times daily  ? escitalopram oxalate (LEXAPRO) 10 MG tablet  ? gabapentin (NEURONTIN) 300 MG capsule Take 300 mg by mouth 2 (two) times daily  ? trospium (SANCTURA) 20 mg tablet  ? ?No current facility-administered medications on file prior to visit.  ? ?Family History  ?Problem Relation Age of Onset  ? Coronary Artery Disease (Blocked arteries around heart) Mother  ? Diabetes Mother  ? Breast cancer Mother  ? Diabetes  Father  ? Skin cancer Brother  ? High blood pressure (Hypertension) Brother  ? ? ?Social History  ? ?Tobacco Use  ?Smoking Status Never  ?Smokeless Tobacco Never  ? ? ?Social History  ? ?Socioeconomic History  ? Marital status: Married  ?Tobacco Use  ? Smoking status: Never  ? Smokeless tobacco: Never   ?Vaping Use  ? Vaping Use: Never used  ?Substance and Sexual Activity  ? Alcohol use: Never  ? Drug use: Never  ? ?Objective:  ? ?Vitals:  ?BP: (!) 142/70  ?Pulse: 52  ?Temp: 36.7 ?C (98 ?F)  ?SpO2: 98%  ?Weight: 73 kg (161 lb)  ?Height: 194.3 cm (6' 4.5")  ? ?Body mass index is 19.34 kg/m?. ? ?Physical Exam  ? ?GENERAL APPEARANCE ?Development: normal ?Nutritional status: normal ?Gross deformities: none ? ?SKIN ?Rash, lesions, ulcers: none ?Induration, erythema: none ?Nodules: none palpable ? ?EYES ?Conjunctiva and lids: normal ?Pupils: equal and reactive ?Iris: normal bilaterally ? ?EARS, NOSE, MOUTH, THROAT ?External ears: no lesion or deformity ?External nose: no lesion or deformity ?Hearing: grossly normal ? ?NECK ?Symmetric: yes ?Trachea: midline ?Thyroid: no palpable nodules in the thyroid bed ? ?CHEST ?Respiratory effort: normal ?Retraction or accessory muscle use: no ?Breath sounds: normal bilaterally ?Rales, rhonchi, wheeze: none ? ?CARDIOVASCULAR ?Auscultation: regular rhythm, normal rate ?Murmurs: Grade 2 systolic murmur ?Pulses: radial pulse 2+ palpable ?Lower extremity edema: none ? ?MUSCULOSKELETAL ?Station and gait: normal, walks with a cane ?Digits and nails: no clubbing or cyanosis ?Muscle strength: grossly normal all extremities ?Range of motion: grossly normal all extremities ?Deformity: none ? ?LYMPHATIC ?Cervical: none palpable ?Supraclavicular: none palpable ? ?PSYCHIATRIC ?Oriented to person, place, and time: yes ?Mood and affect: normal for situation ?Judgment and insight: appropriate for situation ? ?Assessment and Plan:  ? ?Hyperparathyroidism (CMS-HCC) ? ?Patient is referred by his nephrologist for surgical evaluation and recommendations regarding hyperparathyroidism. Patient has undergone a nuclear medicine parathyroid scan suggesting a left inferior adenoma, potentially in an ectopic location, consistent with possible primary hyperparathyroidism. However, given the patient's chronic  kidney disease and elevated creatinine level, secondary hyperparathyroidism would also be in the differential. ? ?I would like to obtain a calcium level, intact PTH level, and 25-hydroxy vitamin D level at our reference laboratory, [facility]. I would also like to obtain an ultrasound examination of the neck in hopes of identifying an abnormal parathyroid gland and also ruling out any concurrent thyroid disease. We will arrange for the studies in the near future. ? ?I will contact the patient with the results of his studies. We will then make a decision on next steps in management. Today we briefly discussed parathyroid surgery. We discussed minimally invasive surgery versus neck exploration and multi gland surgery. Hopefully he has primary hyperparathyroidism and will be a candidate for minimally invasive outpatient surgery at some point in the near future. Patient is provided with written literature on parathyroid surgery to review at home. ? ?We will contact him with the results of his studies as soon as they are available. ? ?Patient provided with a copy of "Parathyroid Surgery: Treatment for Your Parathyroid Gland Problem", published by [employer], 12 pages. Book reviewed and explained to patient during visit today.  ? ?Armandina Gemma, MD ?[facility] ?A [facility] practice ?Office: [phone] ? ?

## 2022-01-03 NOTE — Anesthesia Procedure Notes (Signed)
Procedure Name: Intubation ?Date/Time: 01/03/2022 9:17 AM ?Performed by: Lind Covert, CRNA ?Pre-anesthesia Checklist: Patient identified, Emergency Drugs available, Suction available, Patient being monitored and Timeout performed ?Patient Re-evaluated:Patient Re-evaluated prior to induction ?Oxygen Delivery Method: Circle system utilized ?Preoxygenation: Pre-oxygenation with 100% oxygen ?Induction Type: IV induction ?Ventilation: Mask ventilation without difficulty ?Laryngoscope Size: Mac and 4 ?Grade View: Grade I ?Tube type: Oral ?Tube size: 7.5 mm ?Number of attempts: 1 ?Airway Equipment and Method: Stylet ?Placement Confirmation: ETT inserted through vocal cords under direct vision, breath sounds checked- equal and bilateral and positive ETCO2 ?Secured at: 23 cm ?Tube secured with: Tape ?Dental Injury: Teeth and Oropharynx as per pre-operative assessment  ? ? ? ? ?

## 2022-01-03 NOTE — Op Note (Signed)
OPERATIVE REPORT - PARATHYROIDECTOMY ? ?Preoperative diagnosis: Primary hyperparathyroidism ? ?Postop diagnosis: Same ? ?Procedure: Left inferior minimally invasive parathyroidectomy ? ?Surgeon:  Armandina Gemma, MD ? ?Anesthesia: General endotracheal ? ?Estimated blood loss: Minimal ? ?Preparation: ChloraPrep ? ?Indications: Patient is referred by his nephrologist, Dr. Geraldine Solar, at the [facility] for surgical evaluation and recommendations regarding suspected hyperparathyroidism. Patient has been noted to have abnormal laboratory studies. He has chronic kidney disease with an elevated serum creatinine level. Unfortunately I do not have additional laboratory studies for review today. I do not have a calcium level or an intact PTH level. Patient complains of fatigue. He believes he has had a bone density scan but those results are not available. Patient underwent a nuclear medicine parathyroid scan on October 14, 2021. This demonstrated a focal area of persistent uptake inferior to the left thyroid lobe and an anterior and inferior location. This correlated with an 8 mm soft tissue nodule seen on dedicated CT scan. Suspicion was raised of a possible parathyroid adenoma.  USN confirmed an enlarged parathyroid gland at the left inferior position.  Patient now comes to surgery for parathyroidectomy. ? ?Procedure: The patient was prepared in the pre-operative holding area. The patient was brought to the operating room and placed in a supine position on the operating room table. Following administration of general anesthesia, the patient was positioned and then prepped and draped in the usual strict aseptic fashion. After ascertaining that an adequate level of anesthesia been achieved, a neck incision was made with a #15 blade. Dissection was carried through subcutaneous tissues and platysma. Hemostasis was obtained with the electrocautery. Skin flaps were developed circumferentially and a  Weitlander retractor was placed for exposure. ? ?Strap muscles were incised in the midline. Strap muscles were reflected laterally exposing the left thyroid lobe. With gentle blunt dissection the thyroid lobe was mobilized.  Dissection was carried posteriorly and an enlarged parathyroid gland was identified at the inferior pole of the thyroid gland. It was gently mobilized. Vascular structures were divided between ligaclips. Care was taken to avoid the recurrent laryngeal nerve. The parathyroid gland was completely excised. It was submitted to pathology where frozen section confirmed hypercellular parathyroid tissue consistent with adenoma. ? ?Neck was irrigated with warm saline and good hemostasis was noted. Fibrillar was placed in the operative field. Strap muscles were approximated in the midline with interrupted 3-0 Vicryl sutures. Platysma was closed with interrupted 3-0 Vicryl sutures. Marcaine was infiltrated circumferentially. Skin was closed with a running 4-0 Monocryl subcuticular suture. Wound was washed and dried and Dermabond was applied. Patient was awakened from anesthesia and brought to the recovery room. The patient tolerated the procedure well. ? ? ?Armandina Gemma, MD ?[facility] ?Office: [phone]  ?

## 2022-01-04 ENCOUNTER — Encounter (HOSPITAL_COMMUNITY): Payer: Self-pay | Admitting: Surgery

## 2022-01-04 LAB — SURGICAL PATHOLOGY

## 2022-01-04 NOTE — Anesthesia Postprocedure Evaluation (Signed)
Anesthesia Post Note ? ?Patient: [name] ? ?Procedure(s) Performed: LEFT INFERIOR PARATHYROIDECTOMY (Left: Neck) ? ?  ? ?Patient location during evaluation: PACU ?Anesthesia Type: General ?Level of consciousness: awake and alert ?Pain management: pain level controlled ?Vital Signs Assessment: post-procedure vital signs reviewed and stable ?Respiratory status: spontaneous breathing, nonlabored ventilation, respiratory function stable and patient connected to nasal cannula oxygen ?Cardiovascular status: blood pressure returned to baseline and stable ?Postop Assessment: no apparent nausea or vomiting ?Anesthetic complications: no ? ? ?No notable events documented. ? ?Last Vitals:  ?Vitals:  ? 01/03/22 1126 01/03/22 1130  ?BP: 134/69   ?Pulse: 62   ?Resp: 18 20  ?Temp: (!) 36.4 ?C   ?SpO2: 97%   ?  ?Last Pain:  ?Vitals:  ? 01/03/22 1126  ?TempSrc: Oral  ?PainSc: 0-No pain  ? ? ?  ?  ?  ?  ?  ?  ? ?Cody Silva ? ? ? ? ?

## 2022-01-10 NOTE — Progress Notes (Signed)
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 ?Armandina Gemma, MD ?[facility] ?A [facility] practice ?Office: [phone] ? ?

## 2023-04-04 ENCOUNTER — Emergency Department (HOSPITAL_COMMUNITY): Payer: Medicare PPO

## 2023-04-04 ENCOUNTER — Emergency Department (HOSPITAL_COMMUNITY)
Admission: EM | Admit: 2023-04-04 | Discharge: 2023-04-05 | Disposition: A | Discharge status: Home / Self Care | Payer: Medicare PPO | Attending: Emergency Medicine | Admitting: Emergency Medicine

## 2023-04-04 ENCOUNTER — Encounter (HOSPITAL_COMMUNITY): Payer: Self-pay

## 2023-04-04 DIAGNOSIS — R5383 Other fatigue: Secondary | ICD-10-CM | POA: Diagnosis present

## 2023-04-04 DIAGNOSIS — Z79899 Other long term (current) drug therapy: Secondary | ICD-10-CM | POA: Diagnosis not present

## 2023-04-04 DIAGNOSIS — U071 COVID-19: Secondary | ICD-10-CM | POA: Diagnosis not present

## 2023-04-04 DIAGNOSIS — Z7902 Long term (current) use of antithrombotics/antiplatelets: Secondary | ICD-10-CM | POA: Insufficient documentation

## 2023-04-04 DIAGNOSIS — Z8673 Personal history of transient ischemic attack (TIA), and cerebral infarction without residual deficits: Secondary | ICD-10-CM | POA: Diagnosis not present

## 2023-04-04 DIAGNOSIS — N185 Chronic kidney disease, stage 5: Secondary | ICD-10-CM | POA: Insufficient documentation

## 2023-04-04 DIAGNOSIS — E1140 Type 2 diabetes mellitus with diabetic neuropathy, unspecified: Secondary | ICD-10-CM | POA: Diagnosis not present

## 2023-04-04 DIAGNOSIS — D649 Anemia, unspecified: Secondary | ICD-10-CM | POA: Diagnosis not present

## 2023-04-04 DIAGNOSIS — R600 Localized edema: Secondary | ICD-10-CM | POA: Insufficient documentation

## 2023-04-04 DIAGNOSIS — Z7982 Long term (current) use of aspirin: Secondary | ICD-10-CM | POA: Diagnosis not present

## 2023-04-04 DIAGNOSIS — R531 Weakness: Secondary | ICD-10-CM | POA: Insufficient documentation

## 2023-04-04 DIAGNOSIS — I120 Hypertensive chronic kidney disease with stage 5 chronic kidney disease or end stage renal disease: Secondary | ICD-10-CM | POA: Insufficient documentation

## 2023-04-04 DIAGNOSIS — E1122 Type 2 diabetes mellitus with diabetic chronic kidney disease: Secondary | ICD-10-CM | POA: Diagnosis not present

## 2023-04-04 LAB — CBC WITH DIFFERENTIAL/PLATELET
Abs Immature Granulocytes: 0 10*3/uL (ref 0.00–0.07)
Basophils Absolute: 0 10*3/uL (ref 0.0–0.1)
Basophils Relative: 1 %
Eosinophils Absolute: 0 10*3/uL (ref 0.0–0.5)
Eosinophils Relative: 1 %
HCT: 31.8 % — ABNORMAL LOW (ref 39.0–52.0)
Hemoglobin: 10.2 g/dL — ABNORMAL LOW (ref 13.0–17.0)
Immature Granulocytes: 0 %
Lymphocytes Relative: 15 %
Lymphs Abs: 0.6 10*3/uL — ABNORMAL LOW (ref 0.7–4.0)
MCH: 29.8 pg (ref 26.0–34.0)
MCHC: 32.1 g/dL (ref 30.0–36.0)
MCV: 93 fL (ref 80.0–100.0)
Monocytes Absolute: 0.5 10*3/uL (ref 0.1–1.0)
Monocytes Relative: 13 %
Neutro Abs: 3 10*3/uL (ref 1.7–7.7)
Neutrophils Relative %: 70 %
Platelets: 118 10*3/uL — ABNORMAL LOW (ref 150–400)
RBC: 3.42 MIL/uL — ABNORMAL LOW (ref 4.22–5.81)
RDW: 13.2 % (ref 11.5–15.5)
WBC: 4.2 10*3/uL (ref 4.0–10.5)
nRBC: 0 % (ref 0.0–0.2)

## 2023-04-04 LAB — COMPREHENSIVE METABOLIC PANEL
ALT: 13 U/L (ref 0–44)
AST: 15 U/L (ref 15–41)
Albumin: 3.9 g/dL (ref 3.5–5.0)
Alkaline Phosphatase: 34 U/L — ABNORMAL LOW (ref 38–126)
Anion gap: 10 (ref 5–15)
BUN: 48 mg/dL — ABNORMAL HIGH (ref 8–23)
CO2: 23 mmol/L (ref 22–32)
Calcium: 9.5 mg/dL (ref 8.9–10.3)
Chloride: 106 mmol/L (ref 98–111)
Creatinine, Ser: 5.07 mg/dL — ABNORMAL HIGH (ref 0.61–1.24)
GFR, Estimated: 11 mL/min — ABNORMAL LOW (ref >60)
Glucose, Bld: 93 mg/dL (ref 70–99)
Potassium: 4 mmol/L (ref 3.5–5.1)
Sodium: 139 mmol/L (ref 135–145)
Total Bilirubin: 0.3 mg/dL (ref 0.3–1.2)
Total Protein: 7.1 g/dL (ref 6.5–8.1)

## 2023-04-04 LAB — PROTIME-INR
INR: 1.2 (ref 0.8–1.2)
Prothrombin Time: 15.3 seconds — ABNORMAL HIGH (ref 11.4–15.2)

## 2023-04-04 LAB — I-STAT CG4 LACTIC ACID, ED: Lactic Acid, Venous: 0.7 mmol/L (ref 0.5–1.9)

## 2023-04-04 NOTE — ED Triage Notes (Signed)
Medic was called out for a fall, he is on PLAVIX, medic arrived to patient face down in the ground, but he is expressing no complaints of pain in his head, neck or any other extremities or back. No visible injuries noted upon exam by medic. 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 Medic also reports a fever of 103.  Medic vitals   168/72 90hr 113bgl  650mg  tylenol given en route

## 2023-04-04 NOTE — ED Provider Notes (Signed)
[place] EMERGENCY DEPARTMENT AT [facility] Provider Note  CSN: [id number] Arrival date & time: 04/04/23 2225  Chief Complaint(s) Fall  HPI Cody Silva is a 77 y.o. male {Add pertinent medical, surgical, social history, OB history to HPI:1}    Fall    Past Medical History Past Medical History:  Diagnosis Date   Anemia    Aneurysm of aorta (HCC)    "pt thinks" monitored by [place] in [place]   Arthritis    "right arm, right ankle, right side" (10/30/2014)   Chronic kidney disease    Colon polyps    Depression    Diabetic neuropathy (HCC)    Diabetic retinopathy (HCC)    H/O agent Orange exposure    Headache    "@ least 3 times/wk" (10/30/2014)   History of blood transfusion 10/30/2014   hematochezia   History of gout    Hypertension    Neuromuscular disorder (HCC)    Diabetic neuropathy   OSA on CPAP    does not wear   PTSD (post-traumatic stress disorder)    "service related"   Stroke [facility]) 2014   left extremity deficits; facial left   Type II diabetes mellitus (HCC)    hx of no meds   Patient Active Problem List   Diagnosis Date Noted   Hyperparathyroidism, primary (HCC) 01/03/2022   AKI (acute kidney injury) (HCC) 02/03/2020   Hyperlipidemia associated with type 2 diabetes mellitus (HCC) 02/02/2020   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   Achilles tendon contracture, bilateral 02/07/2018   Right foot ulcer, limited to breakdown of skin (HCC) 02/07/2018   Memory loss 02/06/2018   Gait abnormality 02/06/2018   Diabetic neuropathy (HCC) 02/04/2018   Type II diabetes mellitus with foot ulcer (HCC) 12/05/2017   Diabetic foot infection (HCC)    Fever 12/04/2017   Fever and chills 12/04/2017   Hx of bacteremia    Wound eschar of foot    Acute sepsis (HCC) 11/12/2017   Scrotal pain    Sepsis (HCC) 11/11/2017   Atrophy of muscle of right hand 04/04/2017   Protein-calorie malnutrition, severe 01/19/2017   Altered mental status 01/09/2017    Facial droop 01/09/2017   Cerebral thrombosis with cerebral infarction 01/09/2017   Seizures (HCC)    Other hyperlipidemia    OSA on CPAP 09/12/2016   Weight loss 09/12/2016   Malnutrition of moderate degree 09/11/2016   Syncope 09/10/2016   H/O agent Orange exposure 12/24/2015   Type 2 diabetes with nephropathy (HCC)    Near syncope 12/22/2015   History of TIAs 09/14/2015   CKD stage 3 due to type 2 diabetes mellitus (HCC) 09/14/2015   Acute lower GI bleeding 10/30/2014   History of colonic polyps 10/30/2014   Depression 10/30/2014   Symptomatic anemia 10/30/2014   Acute renal failure superimposed on stage 3b chronic kidney disease (HCC) 10/30/2014   Ataxic gait 09/21/2014   History of CVA (cerebrovascular accident) 02/22/2013   Abnormal brain scan 02/21/2013   Dizziness 02/21/2013   Weakness 02/21/2013   Diabetes mellitus (HCC) 02/21/2013   Hypertension associated with diabetes (HCC) 02/21/2013   Home Medication(s) Prior to Admission medications   Medication Sig Start Date End Date Taking? Authorizing Provider  acetaminophen (TYLENOL) 500 MG tablet Take 1 tablet (500 mg total) by mouth every 6 (six) hours as needed for mild pain, moderate pain, fever or headache. 11/01/14   Otis Brace, MD  albuterol (PROVENTIL HFA;VENTOLIN HFA) 108 (90 Base) MCG/ACT inhaler Inhale 1 puff  into the lungs every 6 (six) hours as needed for wheezing or shortness of breath.    [provider]  amLODipine (NORVASC) 10 MG tablet Take 10 mg by mouth at bedtime.    [provider]  ammonium lactate (LAC-HYDRIN) 12 % lotion Apply 1 application topically as needed for dry skin.    [provider]  Aspirin-Acetaminophen-Caffeine (BC FAST PAIN RELIEF MAX STR) 500-500-65 MG PACK Take 1 packet by mouth daily as needed (pain.).    [provider]  atorvastatin (LIPITOR) 40 MG tablet Take 40 mg by mouth at bedtime.  01/07/20   [provider]  budesonide-formoterol  (SYMBICORT) 80-4.5 MCG/ACT inhaler Inhale 2 puffs into the lungs 2 (two) times daily as needed.    [provider]  carvedilol (COREG) 25 MG tablet Take 12.5 mg by mouth every evening.    [provider]  Cholecalciferol (VITAMIN D3) 10 MCG (400 UNIT) tablet Take 400 Units by mouth at bedtime.    [provider]  cloNIDine (CATAPRES - DOSED IN MG/24 HR) 0.3 mg/24hr patch Replace old patch with new patch every wednesday Patient taking differently: Place 0.3 mg onto the skin every Friday. 12/07/17   Angelita Ingles, MD  clopidogrel (PLAVIX) 75 MG tablet Take 1 tablet (75 mg total) by mouth at bedtime. Patient taking differently: Take 75 mg by mouth in the morning. 11/08/14   Otis Brace, MD  dorzolamide-timolol (COSOPT) 22.3-6.8 MG/ML ophthalmic solution Place 1 drop into both eyes at bedtime.    [provider]  escitalopram (LEXAPRO) 10 MG tablet Take 10 mg by mouth at bedtime.  11/26/19   [provider]  ferrous sulfate 325 (65 FE) MG tablet Take 325 mg by mouth every Monday, Wednesday, and Friday at 8 PM.    [provider]  finasteride (PROSCAR) 5 MG tablet Take 5 mg by mouth daily. 10/24/19   [provider]  fluticasone (FLONASE) 50 MCG/ACT nasal spray Place 1 spray into both nostrils as needed for allergies.     [provider]  gabapentin (NEURONTIN) 300 MG capsule Take 300 mg by mouth at bedtime.    [provider]  ketotifen (ZADITOR) 0.025 % ophthalmic solution Place 1 drop into both eyes daily as needed (for itching or irritation).     [provider]  latanoprost (XALATAN) 0.005 % ophthalmic solution Place 1 drop into both eyes at bedtime.     [provider]  Melatonin 3 MG TABS Take 3 mg by mouth at bedtime.    [provider]  oxybutynin (DITROPAN-XL) 5 MG 24 hr tablet Take 5 mg by mouth daily. 11/26/19   [provider]  terbinafine (LAMISIL) 1 % cream Apply 1  application. topically 2 (two) times daily as needed (irritation (fungal)).    [provider]  tolnaftate (TINACTIN) 1 % powder Apply 1 application. topically 2 (two) times daily as needed (jock itch).    [provider]  trospium (SANCTURA) 20 MG tablet Take 40 mg by mouth at bedtime.    [provider]  Allergies Metformin and related, Lactose intolerance (gi), Terazosin, Tylenol with codeine #3 [acetaminophen-codeine], and Tape  Review of Systems Review of Systems As noted in HPI  Physical Exam Vital Signs  I have reviewed the triage vital signs BP (!) 152/79 (BP Location: Left Arm)   Pulse 81   Temp (!) 101.8 F (38.8 C) (Oral)   Resp 16   SpO2 100%  *** Physical Exam  ED Results and Treatments Labs (all labs ordered are listed, but only abnormal results are displayed) Labs Reviewed  COMPREHENSIVE METABOLIC PANEL - Abnormal; Notable for the following components:      Result Value   BUN 48 (*)    Creatinine, Ser 5.07 (*)    Alkaline Phosphatase 34 (*)    GFR, Estimated 11 (*)    All other components within normal limits  CBC WITH DIFFERENTIAL/PLATELET - Abnormal; Notable for the following components:   RBC 3.42 (*)    Hemoglobin 10.2 (*)    HCT 31.8 (*)    Platelets 118 (*)    Lymphs Abs 0.6 (*)    All other components within normal limits  PROTIME-INR - Abnormal; Notable for the following components:   Prothrombin Time 15.3 (*)    All other components within normal limits  CULTURE, BLOOD (ROUTINE X 2)  CULTURE, BLOOD (ROUTINE X 2)  URINALYSIS, W/ REFLEX TO CULTURE (INFECTION SUSPECTED)  I-STAT CG4 LACTIC ACID, ED                                                                                                                         EKG  EKG Interpretation Date/Time:    Ventricular Rate:    PR Interval:     QRS Duration:    QT Interval:    QTC Calculation:   R Axis:      Text Interpretation:         Radiology DG Chest Port 1 View  Result Date: 04/04/2023 CLINICAL DATA:  Questionable sepsis EXAM: PORTABLE CHEST 1 VIEW COMPARISON:  Chest x-ray 02/02/2020 FINDINGS: The heart size and mediastinal contours are within normal limits. Both lungs are clear. The visualized skeletal structures are unremarkable. IMPRESSION: No active disease. Electronically Signed   By: Darliss Cheney M.D.   On: 04/04/2023 23:30    Medications Ordered in ED Medications - No data to display Procedures Procedures  (including critical care time) Medical Decision Making / ED Course   Medical Decision Making   ***    Final Clinical Impression(s) / ED Diagnoses Final diagnoses:  None    This chart was dictated using voice recognition software.  Despite best efforts to proofread,  errors can occur which can change the documentation meaning.

## 2023-04-04 NOTE — ED Notes (Signed)
Pt arrived via ems after fall in bathroom with +LOC patient does not remember even going into bathroom states I woke up to ems rubbing my chest and calling my name. Patient htn febrile unsure as why he is here or how he got here. Patient family supportive at bedside. Patient currently a/o x 3 respirations even and non labored febrile

## 2023-04-05 DIAGNOSIS — U071 COVID-19: Secondary | ICD-10-CM | POA: Diagnosis not present

## 2023-04-05 LAB — I-STAT CG4 LACTIC ACID, ED: Lactic Acid, Venous: 0.4 mmol/L — ABNORMAL LOW (ref 0.5–1.9)

## 2023-04-05 NOTE — ED Notes (Signed)
Patient assisted with walking. Patient ambulated to bathroom with myself and another tech. Gait was slow and shuffled. Patient states he did not feel dizzy or lightheaded.

## 2024-03-25 ENCOUNTER — Emergency Department (HOSPITAL_COMMUNITY)

## 2024-03-25 ENCOUNTER — Inpatient Hospital Stay (HOSPITAL_COMMUNITY)

## 2024-03-25 ENCOUNTER — Observation Stay (HOSPITAL_COMMUNITY)
Admission: EM | Admit: 2024-03-25 | Discharge: 2024-03-27 | Disposition: A | Discharge status: Home / Self Care | Attending: Family Medicine | Admitting: Family Medicine

## 2024-03-25 ENCOUNTER — Other Ambulatory Visit: Payer: Self-pay

## 2024-03-25 ENCOUNTER — Encounter (HOSPITAL_COMMUNITY): Payer: Self-pay | Admitting: Family Medicine

## 2024-03-25 DIAGNOSIS — D61818 Other pancytopenia: Secondary | ICD-10-CM | POA: Diagnosis not present

## 2024-03-25 DIAGNOSIS — R2689 Other abnormalities of gait and mobility: Secondary | ICD-10-CM | POA: Insufficient documentation

## 2024-03-25 DIAGNOSIS — Z789 Other specified health status: Secondary | ICD-10-CM

## 2024-03-25 DIAGNOSIS — E1122 Type 2 diabetes mellitus with diabetic chronic kidney disease: Secondary | ICD-10-CM | POA: Insufficient documentation

## 2024-03-25 DIAGNOSIS — R001 Bradycardia, unspecified: Secondary | ICD-10-CM | POA: Diagnosis not present

## 2024-03-25 DIAGNOSIS — Z79899 Other long term (current) drug therapy: Secondary | ICD-10-CM | POA: Diagnosis not present

## 2024-03-25 DIAGNOSIS — N185 Chronic kidney disease, stage 5: Secondary | ICD-10-CM | POA: Insufficient documentation

## 2024-03-25 DIAGNOSIS — E119 Type 2 diabetes mellitus without complications: Secondary | ICD-10-CM

## 2024-03-25 DIAGNOSIS — R55 Syncope and collapse: Principal | ICD-10-CM | POA: Insufficient documentation

## 2024-03-25 DIAGNOSIS — Z794 Long term (current) use of insulin: Secondary | ICD-10-CM | POA: Diagnosis not present

## 2024-03-25 DIAGNOSIS — Z7982 Long term (current) use of aspirin: Secondary | ICD-10-CM | POA: Diagnosis not present

## 2024-03-25 DIAGNOSIS — R531 Weakness: Secondary | ICD-10-CM | POA: Diagnosis present

## 2024-03-25 LAB — URINALYSIS, ROUTINE W REFLEX MICROSCOPIC
Bacteria, UA: NONE SEEN
Bilirubin Urine: NEGATIVE
Glucose, UA: NEGATIVE mg/dL
Ketones, ur: NEGATIVE mg/dL
Leukocytes,Ua: NEGATIVE
Nitrite: NEGATIVE
Protein, ur: 30 mg/dL — AB
Specific Gravity, Urine: 1.01 (ref 1.005–1.030)
pH: 5 (ref 5.0–8.0)

## 2024-03-25 LAB — BASIC METABOLIC PANEL WITH GFR
Anion gap: 10 (ref 5–15)
BUN: 44 mg/dL — ABNORMAL HIGH (ref 8–23)
CO2: 22 mmol/L (ref 22–32)
Calcium: 9 mg/dL (ref 8.9–10.3)
Chloride: 111 mmol/L (ref 98–111)
Creatinine, Ser: 4.81 mg/dL — ABNORMAL HIGH (ref 0.61–1.24)
GFR, Estimated: 12 mL/min — ABNORMAL LOW (ref >60)
Glucose, Bld: 110 mg/dL — ABNORMAL HIGH (ref 70–99)
Potassium: 3.8 mmol/L (ref 3.5–5.1)
Sodium: 143 mmol/L (ref 135–145)

## 2024-03-25 LAB — CBC
HCT: 27.2 % — ABNORMAL LOW (ref 39.0–52.0)
Hemoglobin: 8.8 g/dL — ABNORMAL LOW (ref 13.0–17.0)
MCH: 29.8 pg (ref 26.0–34.0)
MCHC: 32.4 g/dL (ref 30.0–36.0)
MCV: 92.2 fL (ref 80.0–100.0)
Platelets: 110 K/uL — ABNORMAL LOW (ref 150–400)
RBC: 2.95 MIL/uL — ABNORMAL LOW (ref 4.22–5.81)
RDW: 13.8 % (ref 11.5–15.5)
WBC: 2.7 K/uL — ABNORMAL LOW (ref 4.0–10.5)
nRBC: 0 % (ref 0.0–0.2)

## 2024-03-25 LAB — BLOOD GAS, VENOUS
Acid-base deficit: 4 mmol/L — ABNORMAL HIGH (ref 0.0–2.0)
Bicarbonate: 22.6 mmol/L (ref 20.0–28.0)
O2 Saturation: 39.9 %
Patient temperature: 36.4
pCO2, Ven: 45 mmHg (ref 44–60)
pH, Ven: 7.31 (ref 7.25–7.43)
pO2, Ven: <31 mmHg — CL (ref 32–45)

## 2024-03-25 LAB — TROPONIN I (HIGH SENSITIVITY)
Troponin I (High Sensitivity): 23 ng/L — ABNORMAL HIGH (ref <18)
Troponin I (High Sensitivity): 26 ng/L — ABNORMAL HIGH (ref <18)

## 2024-03-25 LAB — HEMOGLOBIN A1C
Hgb A1c MFr Bld: 4.5 % — ABNORMAL LOW (ref 4.8–5.6)
Mean Plasma Glucose: 82.45 mg/dL

## 2024-03-25 LAB — RESP PANEL BY RT-PCR (RSV, FLU A&B, COVID)  RVPGX2
Influenza A by PCR: NEGATIVE
Influenza B by PCR: NEGATIVE
Resp Syncytial Virus by PCR: NEGATIVE
SARS Coronavirus 2 by RT PCR: NEGATIVE

## 2024-03-25 LAB — MAGNESIUM: Magnesium: 1.8 mg/dL (ref 1.7–2.4)

## 2024-03-25 LAB — TSH: TSH: 1.506 u[IU]/mL (ref 0.350–4.500)

## 2024-03-25 MED ORDER — SODIUM CHLORIDE 0.9% FLUSH
3.0000 mL | Freq: Two times a day (BID) | INTRAVENOUS | Status: DC
  Administered 2024-03-25 – 2024-03-27 (×4): 3 mL via INTRAVENOUS

## 2024-03-25 MED ORDER — INSULIN ASPART 100 UNIT/ML IJ SOLN: 0.0000 [IU] | Freq: Three times a day (TID) | INTRAMUSCULAR | Status: DC

## 2024-03-25 MED ORDER — SODIUM CHLORIDE 0.9 % IV BOLUS
1000.0000 mL | Freq: Once | INTRAVENOUS | Status: AC
  Administered 2024-03-25: 1000 mL via INTRAVENOUS

## 2024-03-25 MED ORDER — ESCITALOPRAM OXALATE 10 MG PO TABS
10.0000 mg | ORAL_TABLET | Freq: Every day | ORAL | Status: DC
Start: 2024-03-25 — End: 2024-03-27
  Administered 2024-03-25 – 2024-03-26 (×2): 10 mg via ORAL
  Filled 2024-03-25 (×2): qty 1

## 2024-03-25 MED ORDER — ACETAMINOPHEN 650 MG RE SUPP: 650.0000 mg | Freq: Four times a day (QID) | RECTAL | Status: DC | PRN

## 2024-03-25 MED ORDER — ACETAMINOPHEN 325 MG PO TABS
650.0000 mg | ORAL_TABLET | Freq: Four times a day (QID) | ORAL | Status: DC | PRN
  Administered 2024-03-26: 650 mg via ORAL
  Filled 2024-03-25: qty 2

## 2024-03-25 MED ORDER — HEPARIN SODIUM (PORCINE) 5000 UNIT/ML IJ SOLN
5000.0000 [IU] | Freq: Three times a day (TID) | INTRAMUSCULAR | Status: DC
  Administered 2024-03-25 – 2024-03-27 (×4): 5000 [IU] via SUBCUTANEOUS
  Filled 2024-03-25 (×5): qty 1

## 2024-03-25 MED ORDER — CLOPIDOGREL BISULFATE 75 MG PO TABS
75.0000 mg | ORAL_TABLET | Freq: Every morning | ORAL | Status: DC
  Administered 2024-03-26 – 2024-03-27 (×2): 75 mg via ORAL
  Filled 2024-03-25 (×2): qty 1

## 2024-03-25 MED ORDER — ATORVASTATIN CALCIUM 40 MG PO TABS
40.0000 mg | ORAL_TABLET | Freq: Every day | ORAL | Status: DC
  Administered 2024-03-25 – 2024-03-26 (×2): 40 mg via ORAL
  Filled 2024-03-25 (×2): qty 1

## 2024-03-25 MED ORDER — LATANOPROST 0.005 % OP SOLN
1.0000 [drp] | Freq: Every day | OPHTHALMIC | Status: DC
  Administered 2024-03-26: 1 [drp] via OPHTHALMIC
  Filled 2024-03-25: qty 2.5

## 2024-03-25 NOTE — Progress Notes (Signed)
   FMTS Attending Admission Note: Suzann Daring, MD   For questions about this patient, please use [email or web address] to page the family medicine resident on call. Pager number [phone].    I have seen and examined this patient, and reviewed their chart. I have discussed this patient with the resident. I will sign resident note as available.   78 yo with memory impairment, CVA, type 2 DM, and CKD V not on dialysis presenting with syncope. Patient was in bathroom earlier today when he had prodromal symptoms and fell, hitting the wall. Has been slightly confused since that time. In ED, appears to be back to baseline. EMS noted HR to be in 40s in en route.   Syncope, possibly due to bradycardia or vasovagal. Given similarity to prior stroke, will also evaluate with MRI. Must also consider seizure given neurologic history. Will obtain Echocardiogram, MRI wo contrast, and EEG. PT/OT. Discontinue carvedilol  and donepezil.  CKD V, avoid nephrotoxins, no urgent indication for dialysis.  Will sign resident note as available.

## 2024-03-25 NOTE — ED Notes (Signed)
 CCMD contacted to place the patient on cardiac monitoring services.

## 2024-03-25 NOTE — Assessment & Plan Note (Addendum)
 Nephrology reportedly discussed HD with patient per wife, but he has adamantly refused and labs remain stable outpatient. - Caution with nephrotoxic medications, IV contrast - Renal diet w/ 1200 mL fluid restriction - AM BMP

## 2024-03-25 NOTE — H&P (Cosign Needed Addendum)
 Hospital Admission History and Physical Service Pager: [phone]  Patient name: Cody Silva Medical record number: [id number] Date of Birth: 03-07-1946 Age: 78 y.o. Gender: male  Primary Care Provider: [place], [place] Consultants: None Code Status: Full code, which was confirmed with patient and son present at bedside Preferred Emergency Contact: Jamar (son) and Dorothe (spouse) Contact Information     Name Relation Home Work Mobile   Redlich,[name] Spouse [phone]  [phone]   Chamberlain, Steinborn [phone]     Sherwood, Castilla Daughter   [phone]      Other Contacts   None on File     Chief Complaint: Weakness  Differential and Medical Decision Making:  Cody Silva is a 78 y.o. male presenting after a syncopal episode with fall and subsequent encephalopathy, now returned to cognitive baseline but still unable to walk as well as prior to event.  Differential for syncope includes: - Bradycardia and transient hypoperfusion, though now only has 1st degree heart block and borderline bradycardia.  Would not expect encephalopathy lasting minutes after bradycardic syncopal episode.  Orthostatic vitals negative in ED. 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 - Seizure given possible post-ictal period and duration of episode; will obtain spot EEG. - Medication effects of donepezil (hypotension, altered mental status) given inconsistent adherence and taking for the first time in a month today. - Hypothyroidism given bradycardia and altered mental status, however patient is now improved and would expect hypothyroidism to cause more subacute and persistent presentation; will get a TSH to evaluate regardless. - Stroke given history of CVA and some dysmetria with neurological exam  complicated by L-sided blindness, CT head is reassuringly negative; will obtain MRI once patient's possible loop recorder can be investigated for compatibility, would be outside window for intervention regardless.  Will admit patient for observation, further neurological workup, cardiac evaluation. Assessment & Plan Syncope Patient stable, alert and oriented x4, unclear etiology.  CT head reassuringly shows no acute processes.  Orthostatic vitals negative in ED. - Admit to FMTS inpatient, med-tele level of care, attending Dr. Delores - Continuous cardiac monitoring - TTE ordered, previous echo in 2021 LVEF 50-55% - Spot EEG ordered, per patient's son previous seizure workup was negative - VBG, TSH - PT and OT evaluation and treat - Delirium precautions, fall precautions - Hold carvedilol  25 mg given bradycardia - Hold donepezil given hypotension and confusion side effects - Consider Cardiology consult if recurrently bradycardic or symptomatic with bradycardia - Plan for MRI WO once hardware (loop recorder) can be checked for compatibility - AM CBC, BMP, Magnesium  CKD (chronic kidney disease), stage V [facility]) Nephrology reportedly discussed HD with patient per wife, but he has adamantly refused and labs remain stable outpatient. - Caution with nephrotoxic medications, IV contrast - Renal diet w/ 1200 mL fluid restriction - AM BMP Pancytopenia (HCC) Appears to be chronic.  May require further workup outpatient. - Ferritin, iron panel - Transfusion threshold Hgb<7 - AM CBC Type 2 diabetes mellitus (HCC) A1c 4.7 two years ago, likely well controlled. - Repeat hemoglobin A1c at this admission - No SSI or CBGs at this time Chronic health problem HTN - Hold hydralazine  50 mg BID, carvedilol  12.5 mg BID, amlodipine  10 mg daily, normotensive, clonidine  0.1 mg weekly HLD - Restart atorvastatin  40 mg OSA - Ordered CPAP, patient reports he wears  inconsistently BPH - Restart home finasteride  5  mg Hx CVA - Restart home clopidogrel  75 mg Depression - Restart escitalopram  10 mg  FEN/GI: Renal diet w/ 1200 mL fluid restriction VTE Prophylaxis: Heparin  due to CKD stage V  Disposition: med-telemetry  History of Present Illness:  Cody Silva is a 78 y.o. male presenting with a possible syncopal episode with a fall on anticoagulation.  Patient reports that he was in the bathroom earlier today, felt dizzy, weak (legs gave out), got disoriented, got confused, and the fell.  Patient endorses hitting the wall, in particular with his R shoulder.  Thinks he may have lightly hit his head, but no pain there now.  Patient denies any chest pain at the time of the event or currently. Patient denies losing control of his bowel or bladder during the fall, however he seems a little unsure of this. Son-in-law and wife helped patient get up.  Wife and son-in-law felt patient was confused after.  Patient has had falls previously per son who is at bedside.  However his son reports that he has not had a fall in a few months.  Was having similar symptoms years ago when patient had his stroke, per wife who was contacted on the phone.   In the ED, patient remained stable.  Normal sinus rhythm, prolonged PR interval on EKG. 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  Chest x-ray showed small bilateral pleural effusions.  Orthostatic vitals were negative.  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  Pertinent Social History: Tobacco use: Never Alcohol  use: None Other Substance use: None Lives with wife and daughter.  Pertinent Family History: Mother - MI, diabetes, breast cancer, kidney disease,  Father - lung disease Brother - stroke, throat cancer, HTN Maternal Uncle - Neurofibromatosis  Remainder reviewed in history tab.   Important Outpatient Medications: *Has taken all morning meds today *Not adherent to regimen per wife, notably Symbicort, and Donepezil Carvedilol  12.5 mg BID Atorvastatin  daily Gabapentin  300 mg BID Finasteride  5 mg daily Amlodipine  10 mg daily Escitalopram  10 mg daily Melatonin 3 mg QHS Hydralazine  50 mg BID Ferrous sulfate 325 mg MWF Clonidine  0.1 mg weekly Latanoprost  1 drop both eyes QHS Dorzolamide  HCL 1 drop both eyes Q12h Donepezil HCL 5 mg patient took a dose this morning for the first time in a long time per wife Clopidogrel  75 mg daily, not taking at all Symbicort (not using, unclear reasons)  Remainder reviewed in medication history.   Objective: BP 136/66   Pulse 70   Temp 97.6 F (36.4 C) (Oral)   Resp 12   Ht 6' 4 (1.93 m)   Wt 74.8 kg   SpO2 100%   BMI 20.08 kg/m   Exam: General: Awake, alert and oriented x 3, NAD Eyes: Bilateral cataracts ENTM: Mucous membranes moist Cardiovascular: RRR, LLSB 2/6 murmur present Respiratory: CTAB, normal work of breathing on room air Gastrointestinal: Soft, nondistended, nontender to palpation, bowel sounds present Derm: No bruises or abrasions noted on right shoulder  or head Neuro: Cranial nerves II through XII intact with exception of vision testing (impaired in L eye), moderate tremor on finger-to-nose test, left-sided dysmetria on rapid alternating movement, strength 5/5 in upper and lower extremities bilaterally Psych: Mood and affect appropriate  Labs:  CBC BMET  Recent Labs  Lab 03/25/24 1149  WBC 2.7*  HGB 8.8*  HCT 27.2*  PLT  110*   Recent Labs  Lab 03/25/24 1149  NA 143  K 3.8  CL 111  CO2 22  BUN 44*  CREATININE 4.81*  GLUCOSE 110*  CALCIUM  9.0     EKG: 1st degree heart block, borderline sinus bradycardia.  Occasional PVCs.  Left axis deviation, PRWP.  No acute ischemic changes.  Imaging Studies Performed:  Impression from Radiologist:   CT head without contrast: No CT evidence of acute intracranial abnormality 2.   Chronic microvascular ischemic changes. 3.   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  Chest x-ray: Small bilateral pleural effusions. No other acute cardiopulmonary process.  My Interpretation:   Chest x-ray: No focal consolidations.  [name], DO 03/25/2024, 6:52 PM PGY-1, [facility]  FPTS Intern pager: [phone], text pages welcome Secure chat group [facility] Teaching Service  FMTS Upper-Level Resident Attestation I have independently interviewed and examined the patient. I have discussed the above with Dr. Lennie and agree with the documented plan. My edits for correction/addition/clarification are included above. Please see any attending notes.  Ronold Hardgrove Toma, MD PGY-2, [facility] 03/25/2024 7:52 PM FPTS Service pager: [phone] (text pages welcome through [employer])

## 2024-03-25 NOTE — Assessment & Plan Note (Addendum)
 A1c 4.7 two years ago, likely well controlled. - Repeat hemoglobin A1c at this admission - No SSI or CBGs at this time

## 2024-03-25 NOTE — Progress Notes (Signed)
 Lab reported critical lab value of venous blood gas P02<31. Reported to Dr Delores, MD.  Received response from Dr Theophilus with no new orders noted

## 2024-03-25 NOTE — Progress Notes (Signed)
 EEG complete - results pending

## 2024-03-25 NOTE — ED Provider Notes (Signed)
 [place] EMERGENCY DEPARTMENT AT [facility] Provider Note   CSN: [id number] Arrival date & time: 03/25/24  1126     Patient presents with: Weakness   Cody Silva is a 78 y.o. male presenting to the ED with near syncope in the shower today.  Felt lightheaded and fell and hit head. He's on plavex.  Goes to the [place] normally.  EMS reports bradycardia HR low 40's en route.  Pt feeling much better since arriving in ED, but still feels weak all over.  Update -pt's wife reports patient nearly passed out in bathroom today, felt very dizzy, weak, had to be dragged to the bed.  She says this is unusual for him.  He does have dementia.   HPI     Prior to Admission medications   Medication Sig Start Date End Date Taking? Authorizing Provider  acetaminophen  (TYLENOL ) 500 MG tablet Take 1 tablet (500 mg total) by mouth every 6 (six) hours as needed for mild pain, moderate pain, fever or headache. 11/01/14   Margarie Cutter, MD  albuterol  (PROVENTIL  HFA;VENTOLIN  HFA) 108 (90 Base) MCG/ACT inhaler Inhale 1 puff into the lungs every 6 (six) hours as needed for wheezing or shortness of breath.    [provider]  amLODipine  (NORVASC ) 10 MG tablet Take 10 mg by mouth at bedtime.    [provider]  ammonium lactate (LAC-HYDRIN) 12 % lotion Apply 1 application topically as needed for dry skin.    [provider]  Aspirin -Acetaminophen -Caffeine (BC FAST PAIN RELIEF MAX STR) 500-500-65 MG PACK Take 1 packet by mouth daily as needed (pain.).    [provider]  atorvastatin  (LIPITOR) 40 MG tablet Take 40 mg by mouth at bedtime.  01/07/20   [provider]  budesonide-formoterol  (SYMBICORT) 80-4.5 MCG/ACT inhaler Inhale 2 puffs into the lungs 2 (two) times daily as needed.    [provider]  carvedilol  (COREG ) 25 MG tablet Take 12.5 mg by mouth every evening.    [provider]  Cholecalciferol  (VITAMIN D3) 10 MCG (400 UNIT) tablet  Take 400 Units by mouth at bedtime.    [provider]  cloNIDine  (CATAPRES  - DOSED IN MG/24 HR) 0.3 mg/24hr patch Replace old patch with new patch every wednesday Patient taking differently: Place 0.3 mg onto the skin every Friday. 12/07/17   Francesco Elsie NOVAK, MD  clopidogrel  (PLAVIX ) 75 MG tablet Take 1 tablet (75 mg total) by mouth at bedtime. Patient taking differently: Take 75 mg by mouth in the morning. 11/08/14   Margarie Cutter, MD  dorzolamide -timolol  (COSOPT ) 22.3-6.8 MG/ML ophthalmic solution Place 1 drop into both eyes at bedtime.    [provider]  escitalopram  (LEXAPRO ) 10 MG tablet Take 10 mg by mouth at bedtime.  11/26/19   [provider]  ferrous sulfate 325 (65 FE) MG tablet Take 325 mg by mouth every Monday, Wednesday, and Friday at 8 PM.    [provider]  finasteride  (PROSCAR ) 5 MG tablet Take 5 mg by mouth daily. 10/24/19   [provider]  fluticasone  (FLONASE ) 50 MCG/ACT nasal spray Place 1 spray into both nostrils as needed for allergies.     [provider]  gabapentin  (NEURONTIN ) 300 MG capsule Take 300 mg by mouth at bedtime.    [provider]  ketotifen  (ZADITOR ) 0.025 % ophthalmic solution Place 1 drop into both eyes daily as needed (for itching or irritation).     [provider]  latanoprost  (XALATAN ) 0.005 %  ophthalmic solution Place 1 drop into both eyes at bedtime.     [provider]  Melatonin 3 MG TABS Take 3 mg by mouth at bedtime.    [provider]  oxybutynin (DITROPAN-XL) 5 MG 24 hr tablet Take 5 mg by mouth daily. 11/26/19   [provider]  terbinafine (LAMISIL) 1 % cream Apply 1 application. topically 2 (two) times daily as needed (irritation (fungal)).    [provider]  tolnaftate (TINACTIN) 1 % powder Apply 1 application. topically 2 (two) times daily as needed (jock itch).    [provider]  trospium (SANCTURA) 20 MG tablet Take 40 mg  by mouth at bedtime.    [provider]    Allergies: Metformin and related, Lactose intolerance (gi), Terazosin, Tylenol  with codeine #3 [acetaminophen -codeine], and Tape    Review of Systems  Updated Vital Signs BP 131/63   Pulse 60   Temp 97.6 F (36.4 C) (Oral)   Resp 16   Ht 6' 4 (1.93 m)   Wt 74.8 kg   SpO2 96%   BMI 20.08 kg/m   Physical Exam Constitutional:      General: He is not in acute distress. HENT:     Head: Normocephalic and atraumatic.  Eyes:     Conjunctiva/sclera: Conjunctivae normal.     Pupils: Pupils are equal, round, and reactive to light.  Cardiovascular:     Rate and Rhythm: Regular rhythm. Bradycardia present.  Pulmonary:     Effort: Pulmonary effort is normal. No respiratory distress.  Abdominal:     General: There is no distension.     Tenderness: There is no abdominal tenderness.  Skin:    General: Skin is warm and dry.  Neurological:     General: No focal deficit present.     Mental Status: He is alert. Mental status is at baseline.  Psychiatric:        Mood and Affect: Mood normal.        Behavior: Behavior normal.     (all labs ordered are listed, but only abnormal results are displayed) Labs Reviewed  BASIC METABOLIC PANEL WITH GFR - Abnormal; Notable for the following components:      Result Value   Glucose, Bld 110 (*)    BUN 44 (*)    Creatinine, Ser 4.81 (*)    GFR, Estimated 12 (*)    All other components within normal limits  CBC - Abnormal; Notable for the following components:   WBC 2.7 (*)    RBC 2.95 (*)    Hemoglobin 8.8 (*)    HCT 27.2 (*)    Platelets 110 (*)    All other components within normal limits  URINALYSIS, ROUTINE W REFLEX MICROSCOPIC - Abnormal; Notable for the following components:   Hgb urine dipstick SMALL (*)    Protein, ur 30 (*)    All other components within normal limits  TROPONIN I (HIGH SENSITIVITY) - Abnormal; Notable for the following components:   Troponin I (High  Sensitivity) 23 (*)    All other components within normal limits  RESP PANEL BY RT-PCR (RSV, FLU A&B, COVID)  RVPGX2  MAGNESIUM   TROPONIN I (HIGH SENSITIVITY)    EKG: EKG Interpretation Date/Time:  Tuesday March 25 2024 13:59:14 EDT Ventricular Rate:  61 PR Interval:  222 QRS Duration:  110 QT Interval:  458 QTC Calculation: 462 R Axis:   -41  Text Interpretation: Sinus rhythm Prolonged PR interval Left axis deviation Abnormal R-wave  progression, early transition Confirmed by Cottie Cough [phone]) on 03/25/2024 2:09:55 PM  Radiology: DG Chest 2 View Result Date: 03/25/2024 CLINICAL DATA:  Near syncopal episode. EXAM: CHEST - 2 VIEW COMPARISON:  Radiographs 04/04/2023 and 02/02/2020. FINDINGS: Lateral view was repeated. The heart size and mediastinal contours are stable with mild cardiac enlargement. Evidence of small bilateral pleural effusions the lateral view. The lungs appear clear. No pneumothorax or acute osseous abnormality. Telemetry leads overlie the chest. IMPRESSION: Small bilateral pleural effusions. No other acute cardiopulmonary process. Electronically Signed   By: Elsie Perone M.D.   On: 03/25/2024 12:37   CT Head Wo Contrast Result Date: 03/25/2024 CLINICAL DATA:  Head trauma EXAM: CT HEAD WITHOUT CONTRAST TECHNIQUE: Contiguous axial images were obtained from the base of the skull through the vertex without intravenous contrast. RADIATION DOSE REDUCTION: This exam was performed according to the departmental dose-optimization program which includes automated exposure control, adjustment of the mA and/or kV according to patient size and/or use of iterative reconstruction technique. COMPARISON:  MRI head 02/06/2020, CT head 02/02/2020. FINDINGS: Brain: No acute intracranial hemorrhage. No CT evidence of acute infarct. No edema, mass effect, or midline shift. The basilar cisterns are patent. Similar appearance of hypoattenuating left frontoparietal subdural collection.  Nonspecific hypoattenuation in the periventricular and subcortical white matter favored to reflect chronic microvascular ischemic changes. Ventricles: The ventricles are normal. Vascular: Atherosclerotic calcifications of the carotid siphons and intracranial vertebral arteries. No hyperdense vessel. Skull: No acute or aggressive finding. Orbits: Bilateral lens replacement. Sinuses: The visualized paranasal sinuses are clear. Other: Mastoid air cells are clear. IMPRESSION: No CT evidence of acute intracranial abnormality. Chronic microvascular ischemic changes. 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 Electronically Signed   By: Cough Mania M.D.   On: 03/25/2024 12:34     Procedures   Medications Ordered in the ED  sodium chloride  0.9 % bolus 1,000 mL (1,000 mLs Intravenous New Bag/Given 03/25/24 1511)    Clinical Course as of 03/25/24 1547  Tue Mar 25, 2024  1547 Family medicine to admit patient [MT]    Clinical Course User Index [MT] Aaleigha Bozza, Cough PARAS, MD                                 Medical Decision Making Amount and/or Complexity of Data Reviewed Labs: ordered. Radiology: ordered.  Risk Decision regarding hospitalization.   This patient presents to the ED with concern for near syncope. This involves an extensive number of treatment options, and is a complaint that carries with it a high risk of complications and morbidity.  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  I ordered and personally interpreted labs.  The pertinent results include:  Cr 4.8 (he has known stage 5 CKD), hgb 8.8 (his wife reports it was 8-9 at the [place] last week), Trop  23, K wnl, UA without sign of infection  I ordered imaging studies including dg chest, CT head I independently visualized and interpreted imaging which showed no emergent findings I agree with the radiologist interpretation  The patient was maintained on a cardiac monitor.  I personally viewed and interpreted the cardiac monitored which showed an underlying  rhythm of:   Per my interpretation the patient's ECG shows NSR  I ordered medication including IV saline  I have reviewed the patients home medicines and have made adjustments as needed  Test Considered: doubt acute PE, sepsis  After the interventions noted above, I reevaluated the patient and found that they have: stayed the same  Ortho VS without significant shift.  In discussion with family and wife, we'll plan to keep patient on cardiac monitoring for bradycardia episodes overnight and consider echocardiogram in the morning.  Disposition:  After consideration of the diagnostic results and the patients response to treatment, I feel that the patient would benefit from medical admission       Final diagnoses:  Near syncope    ED Discharge Orders     None          Aundria Bitterman, Donnice PARAS, MD 03/25/24 1527

## 2024-03-25 NOTE — Assessment & Plan Note (Addendum)
 HTN - Hold hydralazine  50 mg BID, carvedilol  12.5 mg BID, amlodipine  10 mg daily, normotensive, clonidine  0.1 mg weekly HLD - Restart atorvastatin  40 mg OSA - Ordered CPAP, patient reports he wears inconsistently BPH - Restart home finasteride  5 mg Hx CVA - Restart home clopidogrel  75 mg Depression - Restart escitalopram  10 mg

## 2024-03-25 NOTE — ED Triage Notes (Signed)
 Pt BIB EMS from home with CC of weakness and dizziness s/p GLF in the shower this morning. Pt Aox4. EMS reported at scene pt HR waas in low 40's

## 2024-03-25 NOTE — Assessment & Plan Note (Addendum)
 Patient stable, alert and oriented x4, unclear etiology.  CT head reassuringly shows no acute processes.  Orthostatic vitals negative in ED. - Admit to FMTS inpatient, med-tele level of care, attending Dr. Delores - Continuous cardiac monitoring - TTE ordered, previous echo in 2021 LVEF 50-55% - Spot EEG ordered, per patient's son previous seizure workup was negative - VBG, TSH - PT and OT evaluation and treat - Delirium precautions, fall precautions - Hold carvedilol  25 mg given bradycardia - Hold donepezil given hypotension and confusion side effects - Consider Cardiology consult if recurrently bradycardic or symptomatic with bradycardia - Plan for MRI WO once hardware (loop recorder) can be checked for compatibility - AM CBC, BMP, Magnesium 

## 2024-03-25 NOTE — ED Notes (Signed)
 Pt able to stand at the [facility] with minimal assistance. Pt unable to walk even one step. Pt appeared to have challenges with the neuromuscular aspect of picking up his foot and stepping.

## 2024-03-25 NOTE — Assessment & Plan Note (Addendum)
 Appears to be chronic.  May require further workup outpatient. - Ferritin, iron panel - Transfusion threshold Hgb<7 - AM CBC

## 2024-03-25 NOTE — Plan of Care (Signed)
 FMTS Interim Progress Note  S:Patient seen while getting set up w/ EEG. He is doing well, denies acute concerns. Reports feeling weak while getting out of the shower earlier today, had a fall and may have hit his head. Now reports feeling much better, close to baseline other than still feeling like he doesn't have all his strength back. Denies dyspnea, CP. Discussed the plan for his workup including EEG, echo, EKG, labs, patient agreeable w/ plan. When discussing possibility for MRI, he denies any history of implants or cardiac devices.   O: BP (!) 155/80 (BP Location: Left Arm)   Pulse 70   Temp 97.7 F (36.5 C) (Oral)   Resp 12   Ht 6' 4 (1.93 m)   Wt 74.8 kg   SpO2 100%   BMI 20.08 kg/m   General: Awake, alert, NAD. Communicates clearly. Cardio: RRR, soft systolic murmur Resp: CTA b/l, normal wob on room air Neuro: Significant visual acuity deficits may confound exam. 5/5 strength in B/l LE and UE w/ dysmetria noted in L UE.  A/P: Syncope Patient presented after syncopal event w/ weakness and confusion while getting out of the shower including a fall w/ possible head hit (not on anticoag).  -TTE pending -EEG pending -Holding home carvedilol  and donepezil  -Did not proceed w/ MRI given uncertain history of loop recorder, CXR not revealing  -Trop 23-> 26, pt denies chest pain  -TSH WNL    Manon Jester, DO 03/25/2024, 10:02 PM PGY-1, [facility] Family Medicine Service pager [phone]

## 2024-03-26 ENCOUNTER — Inpatient Hospital Stay (HOSPITAL_BASED_OUTPATIENT_CLINIC_OR_DEPARTMENT_OTHER)

## 2024-03-26 ENCOUNTER — Inpatient Hospital Stay (HOSPITAL_COMMUNITY)

## 2024-03-26 DIAGNOSIS — I472 Ventricular tachycardia, unspecified: Secondary | ICD-10-CM

## 2024-03-26 DIAGNOSIS — R569 Unspecified convulsions: Secondary | ICD-10-CM | POA: Diagnosis not present

## 2024-03-26 DIAGNOSIS — E785 Hyperlipidemia, unspecified: Secondary | ICD-10-CM

## 2024-03-26 DIAGNOSIS — E1122 Type 2 diabetes mellitus with diabetic chronic kidney disease: Secondary | ICD-10-CM | POA: Diagnosis not present

## 2024-03-26 DIAGNOSIS — N185 Chronic kidney disease, stage 5: Secondary | ICD-10-CM | POA: Diagnosis not present

## 2024-03-26 DIAGNOSIS — D61818 Other pancytopenia: Secondary | ICD-10-CM | POA: Diagnosis not present

## 2024-03-26 DIAGNOSIS — I10 Essential (primary) hypertension: Secondary | ICD-10-CM

## 2024-03-26 DIAGNOSIS — R55 Syncope and collapse: Secondary | ICD-10-CM

## 2024-03-26 DIAGNOSIS — R001 Bradycardia, unspecified: Secondary | ICD-10-CM | POA: Diagnosis not present

## 2024-03-26 LAB — CBC
HCT: 25.8 % — ABNORMAL LOW (ref 39.0–52.0)
Hemoglobin: 8.7 g/dL — ABNORMAL LOW (ref 13.0–17.0)
MCH: 30.9 pg (ref 26.0–34.0)
MCHC: 33.7 g/dL (ref 30.0–36.0)
MCV: 91.5 fL (ref 80.0–100.0)
Platelets: 124 K/uL — ABNORMAL LOW (ref 150–400)
RBC: 2.82 MIL/uL — ABNORMAL LOW (ref 4.22–5.81)
RDW: 13.6 % (ref 11.5–15.5)
WBC: 2.8 K/uL — ABNORMAL LOW (ref 4.0–10.5)
nRBC: 0 % (ref 0.0–0.2)

## 2024-03-26 LAB — IRON AND TIBC
Iron: 51 ug/dL (ref 45–182)
Saturation Ratios: 27 % (ref 17.9–39.5)
TIBC: 186 ug/dL — ABNORMAL LOW (ref 250–450)
UIBC: 135 ug/dL

## 2024-03-26 LAB — BASIC METABOLIC PANEL WITH GFR
Anion gap: 9 (ref 5–15)
BUN: 43 mg/dL — ABNORMAL HIGH (ref 8–23)
CO2: 19 mmol/L — ABNORMAL LOW (ref 22–32)
Calcium: 8.9 mg/dL (ref 8.9–10.3)
Chloride: 113 mmol/L — ABNORMAL HIGH (ref 98–111)
Creatinine, Ser: 4.45 mg/dL — ABNORMAL HIGH (ref 0.61–1.24)
GFR, Estimated: 13 mL/min — ABNORMAL LOW (ref >60)
Glucose, Bld: 99 mg/dL (ref 70–99)
Potassium: 3.7 mmol/L (ref 3.5–5.1)
Sodium: 141 mmol/L (ref 135–145)

## 2024-03-26 LAB — TROPONIN I (HIGH SENSITIVITY)
Troponin I (High Sensitivity): 31 ng/L — ABNORMAL HIGH (ref <18)
Troponin I (High Sensitivity): 32 ng/L — ABNORMAL HIGH (ref <18)

## 2024-03-26 LAB — RETICULOCYTES
Immature Retic Fract: 2 % — ABNORMAL LOW (ref 2.3–15.9)
RBC.: 2.88 MIL/uL — ABNORMAL LOW (ref 4.22–5.81)
Retic Count, Absolute: 24.2 K/uL (ref 19.0–186.0)
Retic Ct Pct: 0.8 % (ref 0.4–3.1)

## 2024-03-26 LAB — ECHOCARDIOGRAM COMPLETE
Area-P 1/2: 3.17 cm2
Height: 76 in
S' Lateral: 2.8 cm
Weight: 2640 [oz_av]

## 2024-03-26 LAB — VITAMIN B12: Vitamin B-12: 492 pg/mL (ref 180–914)

## 2024-03-26 LAB — MAGNESIUM: Magnesium: 1.6 mg/dL — ABNORMAL LOW (ref 1.7–2.4)

## 2024-03-26 LAB — FERRITIN: Ferritin: 55 ng/mL (ref 24–336)

## 2024-03-26 MED ORDER — MAGNESIUM SULFATE 2 GM/50ML IV SOLN
2.0000 g | Freq: Once | INTRAVENOUS | Status: AC
  Administered 2024-03-26: 2 g via INTRAVENOUS
  Filled 2024-03-26: qty 50

## 2024-03-26 MED ORDER — POTASSIUM CHLORIDE CRYS ER 20 MEQ PO TBCR
40.0000 meq | EXTENDED_RELEASE_TABLET | Freq: Once | ORAL | Status: AC
  Administered 2024-03-26: 40 meq via ORAL
  Filled 2024-03-26: qty 2

## 2024-03-26 NOTE — Progress Notes (Addendum)
 Transition of Care [facility]) - Inpatient Brief Assessment   Patient Details  Name: KAIGE WHISTLER MRN: [id number] Date of Birth: [birth year]  Transition of Care [facility]) CM/SW Contact:    Rosaline JONELLE Joe, RN Phone Number: 03/26/2024, 10:15 AM   Clinical Narrative: Patient admitted to the hospital from home with Syncopal episode.  Pt/OT evaluation is pending at this time.  No IP Care management needs at this time.  Please place [facility] consult if needs arise.  03/26/24  1658 - I met with the patient at the bedside and offered Medicare choice regarding home health and patient did not have a preference.  I called Hedda CHEADLE and Darleene, RNCM accepted for  [facility] services.  Orders placed to be co-signed by MD.   Transition of Care Asessment: Insurance and Status: (P) Insurance coverage has been reviewed Patient has primary care physician: (P) Yes Home environment has been reviewed: (P) from home Prior level of function:: (P) self Prior/Current Home Services: (P) No current home services Social Drivers of Health Review: (P) SDOH reviewed no interventions necessary Readmission risk has been reviewed: (P) Yes Transition of care needs: (P) no transition of care needs at this time

## 2024-03-26 NOTE — Hospital Course (Signed)
 Cody Silva is a 78 y.o. year old with a history of brain impairment, CVA, CKD stage V, T2DM who presented with syncope and fall and was admitted to the [facility] Medicine Teaching Service for syncope.  Syncope Fall Syncope and fall on 03/25/2024.  Patient became lightheaded, dizzy, and felt like his legs gave out while taking a shower and fell.  Per EMS his heart rate was in the low 40s.  CT head showed no acute abnormality.  Hit his right shoulder, x-ray was negative for fracture.  Patient had an EEG reviewed by neurology was normal.  Orthostatic vitals were positive.  TTE showed LVEF of 60 to 65%, mild concentric hypertrophy, no wall abnormalities, RV size and function normal.  MRI brain without contrast that showed no acute abnormalities. On 7/23 patient had 15 beats of V. tach on telemetry and cardiology was consulted.  Carvedilol  and donepezil were held this admission, cardiology agreed and these medications were discontinued on admission. Cardiology felt this episode of syncope was not due to bradycardia, however, most likely vasovagal syncope, recommended compression stockings.  Cardiology placed a live cardiac monitor to evaluate arrhythmia burden.  Pancytopenia Appears to be chronic.  Ferritin and iron panel were unremarkable.  B12 was normal.  Immature retic fraction was low.  Liver US  showed no gallstones or ductal dilatation, trace ascites, small right pleural effusion.  Recommend outpatient follow up.  Other chronic conditions were medically managed with home medications and formulary alternatives as necessary.  PCP Follow-up Recommendations: Cardiac monitor to assess arrhythmia burden + cardiology follow-up Ensure use of compression stockings + slowly getting up Consider further evaluation for pancytopenia outpatient

## 2024-03-26 NOTE — Progress Notes (Signed)
 Patient heart rate continues to be in the high 40's to 50 range. Physician was notified earlier and stated to continue to monitor patient at this time.  Patient sleeping with CPAP in place and no complaints verbalized.

## 2024-03-26 NOTE — Procedures (Signed)
 Patient Name: Cody Silva  MRN: [id number]  Epilepsy Attending: Arlin MALVA Krebs  Referring Physician/Provider: [name], DO  Date: 03/25/2024 Duration: 24.07 mins  Patient history: 78yo M with syncope. EEG to evaluate for seizure  Level of alertness: Awake  AEDs during EEG study: None  Technical aspects: This EEG study was done with scalp electrodes positioned according to the 10-20 International system of electrode placement. Electrical activity was reviewed with band pass filter of 1-70Hz , sensitivity of 7 uV/mm, display speed of 62mm/sec with a 60Hz  notched filter applied as appropriate. EEG data were recorded continuously and digitally stored.  Video monitoring was available and reviewed as appropriate.  Description: The posterior dominant rhythm consists of 8 Hz activity of moderate voltage (25-35 uV) seen predominantly in posterior head regions, symmetric and reactive to eye opening and eye closing. Hyperventilation and photic stimulation were not performed.     IMPRESSION: This study is within normal limits. No seizures or epileptiform discharges were seen throughout the recording.  A normal interictal EEG does not exclude the diagnosis of epilepsy.   Bronwyn Belasco O Kori Goins

## 2024-03-26 NOTE — Consult Note (Addendum)
 Cardiology Consultation   Patient ID: Cody Silva MRN: [id number]; DOB: 1945-10-08  Admit date: 03/25/2024 Date of Consult: 03/26/2024  PCP:  Clinic, [facility] Providers Cardiologist:  None        Patient Profile: Cody Silva is a 78 y.o. male with a hx of hypertension with orthostatic hypotension, recurrent presyncope/syncope, T2DM, CKD stage V, Hx CVA, hyperlipidemia, and hx of sepsis who is being seen 03/26/2024 for the evaluation of syncope/VT at the request of Suzann Daring MD.  History of Present Illness: He was previously seen by Dr. Kelsie as he had an ILR placed for a cryptogenic CVA in 2017.[facility] was [facility] for a [facility] and no arrhthymias were detected. Patient was having episodes of presyncope/syncope while wearing the ILR. Concurrently patient was being seen by neurology for TIAs and complex seizure like activity. He eventually underwent extensive neurologic testing at [facility] which revealed he was not having seizure activity.   Of note, ILR was removed several years ago by the [place].  Per chart review and interview with patient and family, he has been having episodes of presyncope/syncope for years that have been related to generalized fatigue, orthostatic hypotension and vasovagal. His last episode was about 3 months ago. Per patient and patient's wife, this episode that brought him into the ED was similar to his previous episodes. He was in the shower and had gotten up to get out. Patient felt lightheaded, dizzy, fatigued and diaphoretic. Per wife patient never had LOC he was only confused. She reported he has been getting progressively more confused and developing sundowning. They presented to the hospital this time as he hit his head and had confusion after the fall.   In the ED he was stable. BP 131/63 and HR 60. Hospital obtained spot EEG which was normal and head imaging [CT and MRI] unremarkable for acute intracranial  pathology.   Morning of 7/23 patient noted to have 2 instances of nonsustained VT ( 4 beats and 11 beats). Unfortunately patient's cardiac leads were taken off at 9:30 am, unable to assess rhythm today. He did have an episode of trigeminy prior to the non-sustained VT.  Labs today K 3.7, Cr 4.45, Mag 1.6 Troponin 31->32 Echocardiogram this admission LVEF 60-65% with mild concentric LVH. Mild MR. Mild AR. US  abdomen: trace ascites and small right pleural effusion Most recent ECG: Sinus Bradycardia VR 56, comparable to others this admission though new when compared to prior admissions.  On interview states he is doing okay. Denied chest pain, shortness of breath, or palpitations. He has a history of orthostatic hypotension, and he has a tendency to pass out when he over heats outside. This has been challenging for him this summer as he enjoys sitting outside.   Past Medical History:  Diagnosis Date   Anemia    Aneurysm of aorta (HCC)    [facility] [place]   Arthritis    right arm, right ankle, right side (10/30/2014)   Chronic kidney disease    Colon polyps    Depression    Diabetic neuropathy (HCC)    Diabetic retinopathy (HCC)    H/O agent Orange exposure    Headache    @ least 3 times/wk (10/30/2014)   History of blood transfusion 10/30/2014   hematochezia   History of gout    Hypertension    Neuromuscular disorder (HCC)    Diabetic neuropathy   OSA on CPAP  does not wear   PTSD (post-traumatic stress disorder)    service related   Stroke [facility]) 2014   left extremity deficits; facial left   Type II diabetes mellitus (HCC)    hx of no meds    Past Surgical History:  Procedure Laterality Date   BONE GRAFT HIP ILIAC CREST Left ~ 1976   CATARACT EXTRACTION W/ INTRAOCULAR LENS  IMPLANT, BILATERAL Bilateral 2014-2015   EP IMPLANTABLE DEVICE N/A 12/24/2015   Procedure: Loop Recorder Insertion;  Surgeon: Lynwood Rakers, MD;  Location: [facility] INVASIVE CV  LAB;  Service: Cardiovascular;  Laterality: N/A;   EYE SURGERY     bleed behind eye   PARATHYROIDECTOMY Left 01/03/2022   Procedure: LEFT INFERIOR PARATHYROIDECTOMY;  Surgeon: Eletha Boas, MD;  Location: [facility] ORS;  Service: General;  Laterality: Left;   TEE WITHOUT CARDIOVERSION N/A 12/07/2017   Procedure: TRANSESOPHAGEAL ECHOCARDIOGRAM (TEE);  Surgeon: Okey Vina GAILS, MD;  Location: [facility] ENDOSCOPY;  Service: Cardiovascular;  Laterality: N/A;   TUMOR REMOVAL Right ~ 1976   arm; had to take bone left hip to add to the repair   elbow       Scheduled Meds:  atorvastatin   40 mg Oral QHS   clopidogrel   75 mg Oral q AM   escitalopram   10 mg Oral QHS   heparin   5,000 Units Subcutaneous Q8H   latanoprost   1 drop Both Eyes QHS   sodium chloride  flush  3 mL Intravenous Q12H   Continuous Infusions:  PRN Meds: acetaminophen  **OR** acetaminophen   Allergies:    Allergies  Allergen Reactions   Metformin And Related Diarrhea   Amlodipine  Swelling    Edema. Wife stated the pt is not allergic to this medication.    Felodipine Swelling    Edema    Lactose Intolerance (Gi) Other (See Comments)    Gi upset   Pseudoephedrine-Acetaminophen  Nausea And Vomiting   Terazosin Other (See Comments)    syncope   Tylenol  With Codeine #3 [Acetaminophen -Codeine] Nausea Only    Patient can take regular tylenol  with no problems   Tape Rash    Social History:   Social History   Socioeconomic History   Marital status: Married    Spouse name: sheila   Number of children: 4   Years of education: 16   Highest education level: Not on file  Occupational History   Occupation: Retired  Tobacco Use   Smoking status: Never   Smokeless tobacco: Never  Vaping Use   Vaping status: Never Used  Substance and Sexual Activity   Alcohol  use: Not Currently    Alcohol /week: 1.0 standard drink of alcohol     Types: 1 Cans of beer per week    Comment: 10/30/2014 might have a beer a couple times/yr   Drug use: No    Sexual activity: Not Currently  Other Topics Concern   Not on file  Social History Narrative   Patient is married with 4 children   Patient is right handed   Patient has a Energy manager degree    Patient 4 cups daily   Retired Emergency planning/management officer. Lives with wife, daughter, cousin in [place].   Independent in ADLs and partially dependent for IADLs.   Ambulates with cane sometimes due to intermittent right leg weakness.    Social Drivers of Corporate investment banker Strain: Not on file  Food Insecurity: No Food Insecurity (03/26/2024)   Hunger Vital Sign    Worried About Running Out of Food in the Last Year:  Never true    Ran Out of Food in the Last Year: Never true  Transportation Needs: No Transportation Needs (03/26/2024)   PRAPARE - Administrator, Civil Service (Medical): No    Lack of Transportation (Non-Medical): No  Physical Activity: Not on file  Stress: Not on file  Social Connections: Moderately Integrated (03/26/2024)   Social Connection and Isolation Panel    Frequency of Communication with Friends and Family: Once a week    Frequency of Social Gatherings with Friends and Family: More than three times a week    Attends Religious Services: 1 to 4 times per year    Active Member of [employer] or Organizations: No    Attends Banker Meetings: Never    Marital Status: Married  Catering manager Violence: Not At Risk (03/26/2024)   Humiliation, Afraid, Rape, and Kick questionnaire    Fear of Current or Ex-Partner: No    Emotionally Abused: No    Physically Abused: No    Sexually Abused: No    Family History:   Family History  Problem Relation Age of Onset   Diabetes Mother    Breast cancer Mother    Heart attack Mother        CABG - Age 49   Kidney disease Mother    Stroke Brother    Lung disease Father    Neurofibromatosis Maternal Uncle    Throat cancer Brother    Hypertension Brother      ROS:  Please see the history of present illness.   All other ROS reviewed and negative.     Physical Exam/Data: Vitals:   03/25/24 2334 03/26/24 0421 03/26/24 0824 03/26/24 1132  BP: (!) 135/93 127/63 134/64 (!) 162/70  Pulse: 60 (!) 49 (!) 55 (!) 49  Resp: 20 19 17 17   Temp: 98.1 F (36.7 C) 98.2 F (36.8 C) 98.2 F (36.8 C) 97.6 F (36.4 C)  TempSrc:      SpO2: 100% 100% 100% 100%  Weight:      Height:        Intake/Output Summary (Last 24 hours) at 03/26/2024 1317 Last data filed at 03/26/2024 0854 Gross per 24 hour  Intake --  Output 620 ml  Net -620 ml      03/25/2024   11:57 AM 01/03/2022    7:32 AM 12/30/2021   11:19 AM  Last 3 Weights  Weight (lbs) 165 lb 154 lb 15.7 oz 155 lb  Weight (kg) 74.844 kg 70.3 kg 70.308 kg     Body mass index is 20.08 kg/m.  General:  Chronically ill male, cachetic appearing in no acute distress HEENT: normal Vascular: No carotid bruits; Distal pulses 2+ bilaterally Cardiac:  Rregular rhythm, bradycardic rate, 2/6 systolic murmur without radiation Lungs:  clear to auscultation bilaterally, no wheezing, rhonchi or rales  Abd: soft, nontender, no hepatomegaly  Ext: no edema Musculoskeletal:  No deformities Skin: warm and dry  Psych:  Normal affect   EKG:  The EKG was personally reviewed and demonstrates:  See HPI. Telemetry:  Telemetry was personally reviewed and demonstrates:  Sinus avg HR ~54 with ectopy described above.  Laboratory Data: High Sensitivity Troponin:   Recent Labs  Lab 03/25/24 1149 03/25/24 1445 03/26/24 0817 03/26/24 1022  TROPONINIHS 23* 26* 31* 32*     Chemistry Recent Labs  Lab 03/25/24 1149 03/26/24 0216  NA 143 141  K 3.8 3.7  CL 111 113*  CO2 22 19*  GLUCOSE 110* 99  BUN 44* 43*  CREATININE 4.81* 4.45*  CALCIUM  9.0 8.9  MG 1.8 1.6*  GFRNONAA 12* 13*  ANIONGAP 10 9    No results for input(s): PROT, ALBUMIN, AST, ALT, ALKPHOS, BILITOT in the last 168 hours. Lipids No results for input(s): CHOL, TRIG, HDL, LABVLDL,  LDLCALC, CHOLHDL in the last 168 hours.  Hematology Recent Labs  Lab 03/25/24 1149 03/26/24 0216 03/26/24 1026  WBC 2.7* 2.8*  --   RBC 2.95* 2.82* 2.88*  HGB 8.8* 8.7*  --   HCT 27.2* 25.8*  --   MCV 92.2 91.5  --   MCH 29.8 30.9  --   MCHC 32.4 33.7  --   RDW 13.8 13.6  --   PLT 110* 124*  --    Thyroid   Recent Labs  Lab 03/25/24 1930  TSH 1.506    BNPNo results for input(s): BNP, PROBNP in the last 168 hours.  DDimer No results for input(s): DDIMER in the last 168 hours.  Radiology/Studies:  MR BRAIN WO CONTRAST Result Date: 03/26/2024 CLINICAL DATA:  Syncope/presyncope, cerebrovascular cause suspected. EXAM: MRI HEAD WITHOUT CONTRAST TECHNIQUE: Multiplanar, multiecho pulse sequences of the brain and surrounding structures were obtained without intravenous contrast. COMPARISON:  Head CT 03/25/2024 and MRI 02/06/2020 FINDINGS: Brain: There is no evidence of an acute infarct, mass, midline shift, or hydrocephalus. Patchy T2 hyperintensities in the cerebral white matter are similar to the prior MRI and are nonspecific but compatible with moderate chronic small vessel ischemic disease. Chronic lacunar infarcts are present in the bilateral basal ganglia and thalami. A small subdural hygroma is again noted over the left cerebral convexity without significant mass effect. There is mild cerebral atrophy. There are scattered chronic cerebral microhemorrhages, including in both thalami and potentially reflecting the sequelae of chronic hypertension. Vascular: Major intracranial vascular flow voids are preserved. Skull and upper cervical spine: Unremarkable bone marrow signal. Sinuses/Orbits: Bilateral cataract extraction. Paranasal sinuses and mastoid air cells are clear. Other: None. IMPRESSION: 1. No acute intracranial abnormality. 2. Moderate chronic small vessel ischemic disease. Electronically Signed   By: Dasie Hamburg M.D.   On: 03/26/2024 10:52   EEG adult Result Date:  03/26/2024 Shelton Arlin KIDD, MD     03/26/2024  7:27 AM Patient Name: GENEVA PALLAS MRN: [id number] Epilepsy Attending: Arlin KIDD Shelton Referring Physician/Provider: [name], DO Date: 03/25/2024 Duration: 24.07 mins Patient history: 77yo M with syncope. EEG to evaluate for seizure Level of alertness: Awake AEDs during EEG study: None Technical aspects: This EEG study was done with scalp electrodes positioned according to the 10-20 International system of electrode placement. Electrical activity was reviewed with band pass filter of 1-70Hz , sensitivity of 7 uV/mm, display speed of 64mm/sec with a 60Hz  notched filter applied as appropriate. EEG data were recorded continuously and digitally stored.  Video monitoring was available and reviewed as appropriate. Description: The posterior dominant rhythm consists of 8 Hz activity of moderate voltage (25-35 uV) seen predominantly in posterior head regions, symmetric and reactive to eye opening and eye closing. Hyperventilation and photic stimulation were not performed.   IMPRESSION: This study is within normal limits. No seizures or epileptiform discharges were seen throughout the recording. A normal interictal EEG does not exclude the diagnosis of epilepsy. Arlin KIDD Shelton   DG Shoulder Right Result Date: 03/25/2024 CLINICAL DATA:  Syncope in the shower landing on right shoulder. EXAM: RIGHT SHOULDER - 2+ VIEW COMPARISON:  None Available. FINDINGS: There is no evidence of fracture or dislocation.  Mild acromioclavicular and glenohumeral degenerative change. Soft tissues are unremarkable. Artifact from overlying snaps. Included ribs are intact. IMPRESSION: Negative. Electronically Signed   By: Andrea Gasman M.D.   On: 03/25/2024 19:37   DG Chest 2 View Result Date: 03/25/2024 CLINICAL DATA:  Near syncopal episode. EXAM: CHEST - 2 VIEW COMPARISON:  Radiographs 04/04/2023 and 02/02/2020. FINDINGS: Lateral view was repeated. The heart size and mediastinal  contours are stable with mild cardiac enlargement. Evidence of small bilateral pleural effusions the lateral view. The lungs appear clear. No pneumothorax or acute osseous abnormality. Telemetry leads overlie the chest. IMPRESSION: Small bilateral pleural effusions. No other acute cardiopulmonary process. Electronically Signed   By: Elsie Perone M.D.   On: 03/25/2024 12:37   CT Head Wo Contrast Result Date: 03/25/2024 CLINICAL DATA:  Head trauma EXAM: CT HEAD WITHOUT CONTRAST TECHNIQUE: Contiguous axial images were obtained from the base of the skull through the vertex without intravenous contrast. RADIATION DOSE REDUCTION: This exam was performed according to the departmental dose-optimization program which includes automated exposure control, adjustment of the mA and/or kV according to patient size and/or use of iterative reconstruction technique. COMPARISON:  MRI head 02/06/2020, CT head 02/02/2020. FINDINGS: Brain: No acute intracranial hemorrhage. No CT evidence of acute infarct. No edema, mass effect, or midline shift. The basilar cisterns are patent. Similar appearance of hypoattenuating left frontoparietal subdural collection. Nonspecific hypoattenuation in the periventricular and subcortical white matter favored to reflect chronic microvascular ischemic changes. Ventricles: The ventricles are normal. Vascular: Atherosclerotic calcifications of the carotid siphons and intracranial vertebral arteries. No hyperdense vessel. Skull: No acute or aggressive finding. Orbits: Bilateral lens replacement. Sinuses: The visualized paranasal sinuses are clear. Other: Mastoid air cells are clear. IMPRESSION: No CT evidence of acute intracranial abnormality. Chronic microvascular ischemic changes. 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 Electronically Signed   By: Donnice Mania M.D.   On: 03/25/2024 12:34     Assessment and Plan: Syncope Orthostatic Hypotension Bradycardia  Non-sustained  VT Recurrent issue for patient who has undergone extensive work-up over the years by cardiology and neurology.  Patient known to have orthostatic hypotension and heat intolerance. Per patient and family this episode was similar to previous instances minus confusion after his fall. Patient noted lightheadedness, dizziness, fatigue, and diaphoresis when he stood to get out of the shower.   Orthostatics obtained this admission: laying: 133/64 Standing 0 min: 106/65  Standing 3 min: 112/58 Cardiology was consulted as on the morning of 7/23 patient noted to have two separate episodes of non-sustained VT (4 beats then 11 beats) and some trigeminy. Telemetry was not in place during time of interview. Patient denied palpitations.  He has previously had ILR which was unrevealing based on chart review, it was removed several years ago.  Head imaging and EEG unremarkable.  Elevated troponin's most likely demand ischemia.   I do not suspect patient's syncopal episode is in relation to the bradycardia and nonsustained VT given the symptom description and his history. His electrolyte imbalances could be causing some of this ectopy acutely, however can arrange an outpatient monitor to assess the arrhythmia burden with cardiology follow-up.   Continue cardiac telemetry  Agree with discontinuing coreg  given bradycardia  Agree with discontinuing donzepil as it can cause bradycardia, syncope and ventricular arrhthymias  Keep K > 4 and Mag > 2  Recommend compression stockings  Recommend getting up slowly to prevent falls  Hypertension Orthostatic Hypotension BP: 162/70  Would not recommend aggressive antihypertensive therapy given  recurrent syncope.  Continue to hold PTA coreg , amlodipine  and hydralazine   If blood pressure is consistently > 160 systolic would add amlodipine  2.5 mg  Hyperlipidemia Continue lipitor 40 mg  CKD stage V Historically declined HD Per nephrology  T2DM A1c 4.5%  Ascending  Thoracic Aneurysm [facility]  Per Primary  Pancytopenia OSA BPH Hx CVA Depression  Risk Assessment/Risk Scores:              For questions or updates, please contact [facility] Please consult [email or web address] for contact info under    Signed, Leontine LOISE Salen, PA-C  03/26/2024 1:17 PM

## 2024-03-26 NOTE — Progress Notes (Signed)
   03/26/24 0141  BiPAP/CPAP/SIPAP  $ Non-Invasive Home Ventilator  Initial  $ Face Mask Medium Yes  BiPAP/CPAP/SIPAP Pt Type Adult  BiPAP/CPAP/SIPAP Resmed  Mask Type Full face mask  Dentures removed? Not applicable  Mask Size Medium  Respiratory Rate 18 breaths/min  EPAP 6 cmH2O  PEEP 6 cmH20  FiO2 (%) 28 %  Flow Rate 2 lpm  Patient Home Machine No  Patient Home Mask No  Patient Home Tubing No  Auto Titrate No  Device Plugged into RED Power Outlet Yes

## 2024-03-26 NOTE — Assessment & Plan Note (Addendum)
 A1c 4.5. Well controlled.  - No SSI or CBGs at this time

## 2024-03-26 NOTE — Evaluation (Signed)
 Physical Therapy Evaluation Patient Details Name: Cody Silva MRN: [id number] DOB: 1945-12-16 Today's Date: 03/26/2024  History of Present Illness  78 yo male presenting to [facility] on 03/25/24 after a syncope and fall. No evidence of acute fractures found on imaging. 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 PTA, he was mod I utilizing his cane or holding onto furniture for functional mobility, living with his wife and daughter in a 2-level house with 2 STE. He has baseline vision deficits, impacting his ease with mobility in this unfamiliar environment this date. However, he was able to perform all functional mobility with CGA-supervision for safety and guiding him this date. He demonstrates deficits in coordination, balance, and generalized strength at this time. He could benefit from HHPT follow-up and further acute PT services to maximize his return to baseline.    BP -  125/68 standing after using restroom (denied straining)  178/69 supine end of session *He denied lightheadedness, just reporting feeling weak. HR 50s-60s         If plan is discharge home, recommend the following: A little help with walking and/or transfers;A little help with bathing/dressing/bathroom;Assistance with cooking/housework;Assist for transportation;Help with stairs or ramp for entrance   Can travel by private vehicle        Equipment Recommendations None recommended by PT  Recommendations for Other Services       Functional Status Assessment Patient has had a recent decline in their functional status and demonstrates the ability to make significant improvements in function in a reasonable and predictable amount of time.     Precautions / Restrictions  Precautions Precautions: Fall Precaution/Restrictions Comments: hx of syncope; potentially orthostatic 7/23; watch HR, bradycardic Restrictions Weight Bearing Restrictions Per Provider Order: No      Mobility  Bed Mobility Overal bed mobility: Needs Assistance Bed Mobility: Sit to Supine       Sit to supine: Supervision, HOB elevated   General bed mobility comments: supervision for safety    Transfers Overall transfer level: Needs assistance Equipment used: None Transfers: Sit to/from Stand Sit to Stand: Supervision           General transfer comment: Pt stood from commode with supervision for safety    Ambulation/Gait Ambulation/Gait assistance: Contact guard assist Gait Distance (Feet): 40 Feet Assistive device: 1 person hand held assist, None Gait Pattern/deviations: Step-through pattern, Decreased step length - right, Decreased step length - left, Decreased stride length, Shuffle, Trunk flexed Gait velocity: reduced Gait velocity interpretation: <1.31 ft/sec, indicative of household ambulator   General Gait Details: Pt takes slow, small, shuffling steps with a slightly flexed posture. Pt with noted mild sway and shakiness, reporting feeling weak, but no LOB. CGA for safety and guiding pt due to baseline vision deficits, intermittent HHA to simulate use of cane.  Stairs            Wheelchair Mobility     Tilt Bed    Modified Rankin (Stroke Patients Only)       Balance Overall balance assessment: Needs assistance Sitting-balance support: No upper extremity supported, Feet supported Sitting balance-Leahy Scale: Good     Standing balance support: Single extremity supported, No upper extremity supported, During functional activity Standing balance-Leahy Scale: Fair Standing balance comment: able to reach off COG  minimally-moderately and ambulate without  UE support without LOB but shakiness noted, CGA for safety                              Pertinent Vitals/Pain Pain Assessment Pain Assessment: No/denies pain    Home Living Family/patient expects to be discharged to:: Private residence Living Arrangements: Spouse/significant other;Children (wife and daughter) Available Help at Discharge: Family;Available 24 hours/day Type of Home: House Home Access: Stairs to enter Entrance Stairs-Rails: None Entrance Stairs-Number of Steps: 2 Alternate Level Stairs-Number of Steps: 13 Home Layout: Two level Home Equipment: Cane - single Librarian, academic (2 wheels);Shower seat Additional Comments: 1 fall 3 months ago from pt reportedly passing out.    Prior Function Prior Level of Function : Independent/Modified Independent;History of Falls (last six months)             Mobility Comments: mod I with SPC or furniture walking; 1 fall 3 months ago from pt reportedly passing out. ADLs Comments: ind with bathing, family assist with dressing.     Extremity/Trunk Assessment   Upper Extremity Assessment Upper Extremity Assessment: Defer to OT evaluation RUE Deficits / Details: hx of R weakness from CVA, ROM WFL RUE Sensation: WNL    Lower Extremity Assessment Lower Extremity Assessment: Generalized weakness (denied numbness/tingling bil; grossly 4 to 4+ bil with MMT, seemingly fairly symmetrical)       Communication   Communication Communication: No apparent difficulties    Cognition Arousal: Alert Behavior During Therapy: WFL for tasks assessed/performed   PT - Cognitive impairments: No apparent impairments                         Following commands: Intact       Cueing Cueing Techniques: Verbal cues     General Comments General comments (skin integrity, edema, etc.): BP was 125/68 standing after using restroom (denied straining) then 178/69 supine end of session. He denied lightheadedness, just reporting feeling weak. HR 50s-60s; educated pt on sitting to rest as needed based on his symptoms     Exercises     Assessment/Plan    PT Assessment Patient needs continued PT services  PT Problem List Decreased strength;Decreased activity tolerance;Decreased balance;Decreased mobility;Decreased coordination;Cardiopulmonary status limiting activity       PT Treatment Interventions DME instruction;Gait training;Stair training;Functional mobility training;Therapeutic activities;Therapeutic exercise;Balance training;Neuromuscular re-education;Patient/family education    PT Goals (Current goals can be found in the Care Plan section)  Acute Rehab PT Goals Patient Stated Goal: to improve his strength PT Goal Formulation: With patient/family Time For Goal Achievement: 04/09/24 Potential to Achieve Goals: Good    Frequency Min 2X/week     Co-evaluation               AM-PAC PT 6 Clicks Mobility  Outcome Measure Help needed turning from your back to your side while in a flat bed without using bedrails?: A Little Help needed moving from lying on your back to sitting on the side of a flat bed without using bedrails?: A Little Help needed moving to and from a bed to a chair (including a wheelchair)?: A Little Help needed standing up from a chair using your arms (e.g., wheelchair or bedside chair)?: A Little Help needed to walk in hospital room?: A Little Help needed climbing 3-5 steps with a railing? : A Little 6 Click Score: 18    End of Session Equipment Utilized During  Treatment: Gait belt Activity Tolerance: Patient tolerated treatment well Patient left: in bed;with call bell/phone within reach;with bed alarm set;with family/visitor present Nurse Communication:  (notified MD of HR and BP) PT Visit Diagnosis: Unsteadiness on feet (R26.81);Other abnormalities of gait and mobility (R26.89);Muscle weakness (generalized) (M62.81);Difficulty in walking, not elsewhere classified (R26.2)    Time: 1451-1520 PT Time Calculation (min) (ACUTE ONLY): 29 min   Charges:   PT  Evaluation $PT Eval Moderate Complexity: 1 Mod PT Treatments $Therapeutic Activity: 8-22 mins PT General Charges $$ ACUTE PT VISIT: 1 Visit         Theo Ferretti, PT, DPT Acute Rehabilitation Services  Office: [phone]   Theo CHRISTELLA Ferretti 03/26/2024, 6:00 PM

## 2024-03-26 NOTE — Progress Notes (Addendum)
 Daily Progress Note Intern Pager: [phone]  Patient name: Cody Silva Medical record number: [id number] Date of birth: 03-22-1946 Age: 78 y.o. Gender: male  Primary Care Provider: [facility] Consultants: None Code Status: Full code   Pt Overview and Major Events to Date:  7/22: Admitted   Medical Decision Making:  Cody Silva is 78 y.o. male admitted for a syncopal episode of unclear etiology, most likely 2/2 to bradycardia. Pertinent PMH/PSH includes memory impairment, CVA, CKD stage V, T2DM, visual impairment.  Assessment & Plan Syncope Patient stable, alert and oriented x4, unclear etiology. VBG reassuring. TSH within normal limits. 15 beats of Vtach at 7:30 this morning. Spot EEG normal. Troponins 31-->32. No ST elevations on EKG.  - Continuous cardiac monitoring - TTE ordered, previous echo in 2021 LVEF 50-55% - PT and OT evaluation and treat - Delirium precautions, fall precautions - Hold carvedilol  25 mg given bradycardia - Hold donepezil given hypotension and confusion side effects - Cardiology consult given VT on monitor and presenting symptoms - Plan for MRI WO- negative for stroke  - AM CBC, BMP, Magnesium  CKD (chronic kidney disease), stage V [facility]) Nephrology reportedly discussed HD with patient per wife, but he has adamantly refused and labs remain stable outpatient. - Caution with nephrotoxic medications, IV contrast - Renal diet w/ 1200 mL fluid restriction - AM BMP Pancytopenia (HCC) Appears to be chronic.  May require further workup outpatient. Ferritin and iron panel unremarkable.  - Transfusion threshold Hgb<7 - B12, reticulocyte count - Liver US  - AM CBC Type 2 diabetes mellitus (HCC) A1c 4.5. Well controlled.  - No SSI or CBGs at this time Chronic health problem HTN - Hold hydralazine  50 mg BID, carvedilol  12.5 mg BID, amlodipine  10 mg daily, normotensive, clonidine  0.1 mg weekly HLD - Restart atorvastatin  40 mg OSA -  Ordered CPAP, patient reports he wears inconsistently BPH - Restart home finasteride  5 mg Hx CVA - Restart home clopidogrel  75 mg Depression - Restart escitalopram  10 mg  FEN/GI: Renal diet 1200 mL fluid restriction  PPx: Heparin  due to CKD stage V Dispo: Home pending clinical improvement   Subjective:  Seen lying in bed this morning.  He denies any current chest pain.  States he is feeling stronger this morning than he was yesterday.  Patient endorses chest pressure 1 hour prior to me visiting.  Objective: Temp:  [97.6 F (36.4 C)-98.2 F (36.8 C)] 98.2 F (36.8 C) (07/23 0421) Pulse Rate:  [25-72] 49 (07/23 0421) Resp:  [10-24] 19 (07/23 0421) BP: (112-155)/(58-93) 127/63 (07/23 0421) SpO2:  [96 %-100 %] 100 % (07/23 0421) FiO2 (%):  [28 %] 28 % (07/23 0141) Weight:  [74.8 kg] 74.8 kg (07/22 1157) Physical Exam: General: Awake, alert, NAD Cardiovascular: Bradycardic, no m/r/g  Respiratory: CTAB, normal work of breathing on room air Abdomen: Soft, flat, nondistended, nontender to palpation, bowel sounds present Extremities: Lower extremity edema  Laboratory: Most recent CBC Lab Results  Component Value Date   WBC 2.8 (L) 03/26/2024   HGB 8.7 (L) 03/26/2024   HCT 25.8 (L) 03/26/2024   MCV 91.5 03/26/2024   PLT 124 (L) 03/26/2024   Most recent BMP    Latest Ref Rng & Units 03/26/2024    2:16 AM  BMP  Glucose 70 - 99 mg/dL 99   BUN 8 - 23 mg/dL 43   Creatinine 9.38 - 1.24 mg/dL 5.54   Sodium 864 - 854 mmol/L 141   Potassium 3.5 -  5.1 mmol/L 3.7   Chloride 98 - 111 mmol/L 113   CO2 22 - 32 mmol/L 19   Calcium  8.9 - 10.3 mg/dL 8.9    Magnesium  1.6 Ferritin 55 Iron 51 TIBC 186  Imaging/Diagnostic Tests: X-ray right shoulder: No fractures.  [name], DO 03/26/2024, 8:06 AM  PGY-1, [facility] Family Medicine FPTS Intern pager: [phone], text pages welcome Secure chat group [facility] Teaching Service

## 2024-03-26 NOTE — Assessment & Plan Note (Addendum)
 Nephrology reportedly discussed HD with patient per wife, but he has adamantly refused and labs remain stable outpatient. - Caution with nephrotoxic medications, IV contrast - Renal diet w/ 1200 mL fluid restriction - AM BMP

## 2024-03-26 NOTE — Assessment & Plan Note (Addendum)
 Appears to be chronic.  May require further workup outpatient. Ferritin and iron panel unremarkable.  - Transfusion threshold Hgb<7 - B12, reticulocyte count - Liver US  - AM CBC

## 2024-03-26 NOTE — Assessment & Plan Note (Addendum)
 Patient stable, alert and oriented x4, unclear etiology. VBG reassuring. TSH within normal limits. 15 beats of Vtach at 7:30 this morning. Spot EEG normal. Troponins 31-->32. No ST elevations on EKG.  - Continuous cardiac monitoring - TTE ordered, previous echo in 2021 LVEF 50-55% - PT and OT evaluation and treat - Delirium precautions, fall precautions - Hold carvedilol  25 mg given bradycardia - Hold donepezil given hypotension and confusion side effects - Cardiology consult given VT on monitor and presenting symptoms - Plan for MRI WO- negative for stroke  - AM CBC, BMP, Magnesium 

## 2024-03-26 NOTE — Care Plan (Addendum)
 I received a page regarding the patient experiencing 15 episodes of ventricular tachycardia (VTach). The page did not include a name or a callback number. I contacted the inpatient resident/Dr. Toma and requested an immediate assessment of the patient. This morning, the following labs and diagnostics need to be obtained: Bmet, Mg, Troponin, and EKG. Correct electrolyte abnormalities if present. Consider cardiology consultation if not already done.  I have messaged and Handoff patient care and monitoring to the inpatient attending, Dr. Suzann Daring for evaluation and treatment.

## 2024-03-26 NOTE — Evaluation (Signed)
 Occupational Therapy Evaluation Patient Details Name: Cody Silva MRN: [id number] DOB: 1946-01-20 Today's Date: 03/26/2024   History of Present Illness   78 yo male presenting to [facility] on 03/25/24 after a syncope and fall. No evidence of acute fractures found on imaging. 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 Pt currently presenting with generalized weakness, ambulating with min A +RW, needing assist mainly with navigation for low vision deficits and not his balance. Pt demonstrating capacity to complete ADLs with min A to setup A, anticipate pt to perform better in a familiar environment, HR at 58 with activity. OT to continue following pt acutely to progress pt as able. Patient would benefit from post acute Home OT services to help maximize functional independence in natural environment   Orthostatic BPs  Supine 148/71  Sitting 150/61  Sitting after 3 min 156/62  Standing 134/71  Standing 3 mins 138/56    Pt reported mild lightheadedness with prolonged standing, did not impact balance at that time.    If plan is discharge home, recommend the following:   A little help with walking and/or transfers;Assistance with cooking/housework;Assist for transportation;Direct supervision/assist for medications management     Functional Status Assessment   Patient has had a recent decline in their functional status and demonstrates the ability to make significant improvements in function in a reasonable and predictable amount of time.     Equipment Recommendations   None recommended by OT (pt has rec DME)      Recommendations for Other Services         Precautions/Restrictions   Precautions Precautions: Fall Precaution/Restrictions Comments: hx of syncope Restrictions Weight Bearing Restrictions Per Provider Order: No     Mobility Bed Mobility Overal bed mobility: Needs Assistance Bed Mobility: Supine to Sit     Supine to sit: Supervision          Transfers Overall transfer level: Needs assistance Equipment used: Rolling walker (2 wheels), None Transfers: Sit to/from Stand Sit to Stand: Contact guard assist           General transfer comment: tactile cues for location of RW      Balance Overall balance assessment: Needs assistance Sitting-balance support: No upper extremity supported, Feet supported Sitting balance-Leahy Scale: Good     Standing balance support: Bilateral upper extremity supported Standing balance-Leahy Scale: Fair                             ADL either performed or assessed with clinical judgement   ADL   Eating/Feeding: Independent;Sitting   Grooming: Standing;Contact guard assist           Upper Body Dressing : Set up;Sitting   Lower Body Dressing: Sitting/lateral leans;Set up   Toilet Transfer: Rolling walker (2 wheels);Ambulation;Minimal assistance           Functional mobility during ADLs: Minimal assistance;Rolling walker (2 wheels) General ADL Comments: Assist to guide RW, low vision at baseline.     Vision Ability to See in Adequate Light: 3 Highly impaired Patient Visual Report: Other (comment);Blurring of  vision (legally blind) Additional Comments: Pt reports being fully blind in L eye with partial vision in R eye.     Perception         Praxis         Pertinent Vitals/Pain Pain Assessment Pain Assessment: No/denies pain     Extremity/Trunk Assessment Upper Extremity Assessment Upper Extremity Assessment: Generalized weakness;RUE deficits/detail RUE Deficits / Details: hx of R weakness  from CVA, ROM WFL RUE Sensation: WNL   Lower Extremity Assessment Lower Extremity Assessment: Defer to PT evaluation       Communication Communication Communication: No apparent difficulties   Cognition Arousal: Alert Behavior During Therapy: WFL for tasks assessed/performed Cognition: No apparent impairments                               Following commands: Intact       Cueing  General Comments   Cueing Techniques: Verbal cues  see orthostatics section   Exercises     Shoulder Instructions      Home Living Family/patient expects to be discharged to:: Private residence Living Arrangements: Spouse/significant other;Children (wife and daughter) Available Help at Discharge: Family;Available 24 hours/day Type of Home: House Home Access: Stairs to enter [employer] of Steps: 2 Entrance Stairs-Rails: None Home Layout: Two level Alternate Level Stairs-Number of Steps: 13 Alternate Level Stairs-Rails: Right Bathroom Shower/Tub: Producer, television/film/video: Standard     Home Equipment: Cane - single Librarian, academic (2 wheels);Shower seat   Additional Comments: 1 fall 3 months ago from pt reportedly passing out.      Prior Functioning/Environment Prior Level of Function : Independent/Modified Independent;History of Falls (last six months)             Mobility Comments: mod I with SPC or furniture walking ADLs Comments: ind with bathing, family assist with dressing.    OT Problem List: Decreased strength;Impaired balance (sitting and/or standing);Impaired vision/perception   OT Treatment/Interventions: Therapeutic exercise;Patient/family education;Balance training;Therapeutic activities;DME and/or AE instruction      OT Goals(Current goals can be found in the care plan section)   Acute Rehab OT Goals Patient Stated Goal: Go home OT Goal Formulation: With patient Time For Goal Achievement: 04/09/24 Potential to Achieve  Goals: Good   OT Frequency:  Min 2X/week    Co-evaluation              AM-PAC OT 6 Clicks Daily Activity     Outcome Measure Help from another person eating meals?: None Help from another person taking care of personal grooming?: A Little Help from another person toileting, which includes using toliet, bedpan, or urinal?: A Little Help from another person bathing (including washing, rinsing, drying)?: A Little Help from another person to put on and taking off regular upper body clothing?: A Little Help from another person to put on and taking off regular lower body clothing?: A Little 6 Click Score: 19   End of Session Equipment Utilized During Treatment: Gait belt;Oxygen;Left knee immobilizer Nurse Communication: Mobility status  Activity Tolerance: Patient tolerated treatment well Patient left: Other (comment) (Pt on toilet in bathroom with PT at doorway)  OT Visit Diagnosis: History of falling (Z91.81);Unsteadiness on feet (R26.81)                Time: 1415-1450 OT Time Calculation (min): 35 min Charges:  OT General Charges $OT Visit: 1 Visit OT Evaluation $OT Eval Low Complexity: 1 Low OT  Treatments $Self Care/Home Management : 8-22 mins  03/26/2024  AB, OTR/L  Acute Rehabilitation Services  Office: [phone]   Curtistine JONETTA Das 03/26/2024, 3:15 PM

## 2024-03-26 NOTE — Plan of Care (Signed)
 Patient new to room and is adjusting without any complaints.

## 2024-03-26 NOTE — Assessment & Plan Note (Addendum)
 HTN - Hold hydralazine  50 mg BID, carvedilol  12.5 mg BID, amlodipine  10 mg daily, normotensive, clonidine  0.1 mg weekly HLD - Restart atorvastatin  40 mg OSA - Ordered CPAP, patient reports he wears inconsistently BPH - Restart home finasteride  5 mg Hx CVA - Restart home clopidogrel  75 mg Depression - Restart escitalopram  10 mg

## 2024-03-27 ENCOUNTER — Other Ambulatory Visit (HOSPITAL_COMMUNITY): Payer: Self-pay

## 2024-03-27 ENCOUNTER — Inpatient Hospital Stay (HOSPITAL_BASED_OUTPATIENT_CLINIC_OR_DEPARTMENT_OTHER)
Admit: 2024-03-27 | Discharge: 2024-03-27 | Disposition: A | Discharge status: Home / Self Care | Attending: Cardiology | Admitting: Cardiology

## 2024-03-27 ENCOUNTER — Other Ambulatory Visit: Payer: Self-pay | Admitting: *Deleted

## 2024-03-27 DIAGNOSIS — I471 Supraventricular tachycardia, unspecified: Secondary | ICD-10-CM | POA: Diagnosis not present

## 2024-03-27 DIAGNOSIS — I951 Orthostatic hypotension: Secondary | ICD-10-CM | POA: Diagnosis not present

## 2024-03-27 DIAGNOSIS — N185 Chronic kidney disease, stage 5: Secondary | ICD-10-CM

## 2024-03-27 DIAGNOSIS — R55 Syncope and collapse: Secondary | ICD-10-CM | POA: Diagnosis not present

## 2024-03-27 DIAGNOSIS — R001 Bradycardia, unspecified: Secondary | ICD-10-CM | POA: Diagnosis not present

## 2024-03-27 LAB — CBC WITH DIFFERENTIAL/PLATELET
Abs Immature Granulocytes: 0.01 K/uL (ref 0.00–0.07)
Basophils Absolute: 0 K/uL (ref 0.0–0.1)
Basophils Relative: 1 %
Eosinophils Absolute: 0.1 K/uL (ref 0.0–0.5)
Eosinophils Relative: 4 %
HCT: 26.5 % — ABNORMAL LOW (ref 39.0–52.0)
Hemoglobin: 8.7 g/dL — ABNORMAL LOW (ref 13.0–17.0)
Immature Granulocytes: 0 %
Lymphocytes Relative: 34 %
Lymphs Abs: 1 K/uL (ref 0.7–4.0)
MCH: 30.1 pg (ref 26.0–34.0)
MCHC: 32.8 g/dL (ref 30.0–36.0)
MCV: 91.7 fL (ref 80.0–100.0)
Monocytes Absolute: 0.3 K/uL (ref 0.1–1.0)
Monocytes Relative: 12 %
Neutro Abs: 1.4 K/uL — ABNORMAL LOW (ref 1.7–7.7)
Neutrophils Relative %: 49 %
Platelets: 124 K/uL — ABNORMAL LOW (ref 150–400)
RBC: 2.89 MIL/uL — ABNORMAL LOW (ref 4.22–5.81)
RDW: 13.7 % (ref 11.5–15.5)
WBC: 2.9 K/uL — ABNORMAL LOW (ref 4.0–10.5)
nRBC: 0 % (ref 0.0–0.2)

## 2024-03-27 LAB — BASIC METABOLIC PANEL WITH GFR
Anion gap: 7 (ref 5–15)
BUN: 41 mg/dL — ABNORMAL HIGH (ref 8–23)
CO2: 20 mmol/L — ABNORMAL LOW (ref 22–32)
Calcium: 9.2 mg/dL (ref 8.9–10.3)
Chloride: 115 mmol/L — ABNORMAL HIGH (ref 98–111)
Creatinine, Ser: 4.29 mg/dL — ABNORMAL HIGH (ref 0.61–1.24)
GFR, Estimated: 14 mL/min — ABNORMAL LOW (ref >60)
Glucose, Bld: 83 mg/dL (ref 70–99)
Potassium: 4.2 mmol/L (ref 3.5–5.1)
Sodium: 142 mmol/L (ref 135–145)

## 2024-03-27 LAB — MAGNESIUM: Magnesium: 2.1 mg/dL (ref 1.7–2.4)

## 2024-03-27 MED ORDER — AMLODIPINE BESYLATE 5 MG PO TABS
5.0000 mg | ORAL_TABLET | Freq: Every day | ORAL | 0 refills | Status: DC
  Filled 2024-03-27: qty 30, 30d supply, fill #0

## 2024-03-27 NOTE — Discharge Instructions (Addendum)
 Dear Cody Silva,   Thank you so much for allowing us  to be part of your care!  You were admitted to [facility] for loss of consciousness with a low heart rate.  You were seen and cleared by our cardiologists, who are sending you out with a heart monitor for a month and will be in touch about the results.  We have stopped your donepezil and your carvedilol  because of your low heart rate and because these medicines can suddenly drop your blood pressure and make you fall.  POST-HOSPITAL & CARE INSTRUCTIONS Please continue to wear your heart monitor as directed by cardiology. Please let PCP/Specialists know of any changes that were made.  Please see medications section of this packet for any medication changes.   DOCTOR'S APPOINTMENT & FOLLOW UP CARE INSTRUCTIONS   Follow-up Information     Care, [facility] Follow up.   Specialty: Home Health Services Why: [facility] will provide home health services.  They will call you in the next 24-48 hours to set up services. Contact information: [address] [place] [address] [phone]         [facility] Schedule an appointment as soon as possible for a visit in 1 week(s).   Contact information: [address] [facility] [place] [address] [phone]                RETURN PRECAUTIONS: Please call EMS if you lose consciousness again Please also seek care if you feel concerning chest pain, cannot breathe, or have concerns about how your heart or heart beat feels  Take care and be well!  Family Medicine Teaching Service Inpatient Team [facility]  [address] [place], [place] [address] [phone]

## 2024-03-27 NOTE — Progress Notes (Incomplete)
 Daily Progress Note Intern Pager: [phone]  Patient name: Cody Silva Medical record number: [id number] Date of birth: 09/04/1946 Age: 78 y.o. Gender: male  Primary Care Provider: [facility] Consultants: Cardiology Code Status: Full code   Pt Overview and Major Events to Date:  7/22: Admitted 7/23: Cardiology consulted   Medical Decision Making:  Cody Silva is a 78 y.o. male admitted for a syncopal episode likely vasovagal syncope. Pertinent PMH/PSH includes memory impairment, CVA, CKD stage V, T2DM, visual impairment.  Assessment & Plan Syncope Patient stable. Echo LVEF 60-65%, mild concentric hypertrophy, no wall abnormalities, RV size and function normal. Mild MV regurgitation, tricuspid valve mild regurgitation and calcification, but no stenosis. Positive orthostatics. MRI - negative for stroke, chronic ischemic changes.  - Continuous cardiac monitoring - TTE ordered, previous echo in 2021 LVEF 50-55% - PT and OT evaluation and treat - Delirium precautions, fall precautions - Hold carvedilol  25 mg given bradycardia - Hold donepezil given hypotension and confusion side effects - Cardiology consult given VT on monitor and presenting symptoms  - compression stockings   - cardiac monitor  - AM CBC, BMP, Magnesium  CKD (chronic kidney disease), stage V [facility]) Nephrology reportedly discussed HD with patient per wife, but he has adamantly refused and labs remain stable outpatient. - Caution with nephrotoxic medications, IV contrast - Renal diet w/ 1200 mL fluid restriction - AM BMP Pancytopenia (HCC) Appears to be chronic.  May require further workup outpatient. B12 492. Immature retic fraction 2.0. Liver US : no gallstones or ductal dilation, trace ascites, small R pleural effusion.  - Transfusion threshold Hgb<7 - Liver US  - AM CBC Type 2 diabetes mellitus (HCC) A1c 4.5. Well controlled.  - No SSI or CBGs at this time Chronic health  problem HTN - Hold hydralazine  50 mg BID, carvedilol  12.5 mg BID, amlodipine  10 mg daily, normotensive, clonidine  0.1 mg weekly HLD - Restart atorvastatin  40 mg OSA - Ordered CPAP, patient reports he wears inconsistently BPH - Restart home finasteride  5 mg Hx CVA - Restart home clopidogrel  75 mg Depression - Restart escitalopram  10 mg  FEN/GI: Renal diet 1200 mL fluid restriction  PPx: Heparin  due to CKD stage V Dispo: Home with home health PT/OT  Subjective:  ***  Objective: Temp:  [97.5 F (36.4 C)-98.2 F (36.8 C)] 97.8 F (36.6 C) (07/24 0558) Pulse Rate:  [49-63] 61 (07/24 0558) Resp:  [17-20] 20 (07/24 0558) BP: (134-175)/(64-81) 155/74 (07/24 0558) SpO2:  [100 %] 100 % (07/24 0558) Physical Exam: General: *** Cardiovascular: *** Respiratory: *** Abdomen: *** Extremities: ***  Laboratory: Most recent CBC Lab Results  Component Value Date   WBC 2.9 (L) 03/27/2024   HGB 8.7 (L) 03/27/2024   HCT 26.5 (L) 03/27/2024   MCV 91.7 03/27/2024   PLT 124 (L) 03/27/2024   Most recent BMP    Latest Ref Rng & Units 03/27/2024    2:12 AM  BMP  Glucose 70 - 99 mg/dL 83   BUN 8 - 23 mg/dL 41   Creatinine 9.38 - 1.24 mg/dL 5.70   Sodium 864 - 854 mmol/L 142   Potassium 3.5 - 5.1 mmol/L 4.2   Chloride 98 - 111 mmol/L 115   CO2 22 - 32 mmol/L 20   Calcium  8.9 - 10.3 mg/dL 9.2     Other pertinent labs ***   Imaging/Diagnostic Tests: No new imaging   Cody Raguel MATSU, DO 03/27/2024, 8:13 AM  PGY-1, [facility] FPTS Intern  pager: [phone], text pages welcome Secure chat group [facility] Teaching Service

## 2024-03-27 NOTE — Care Management Obs Status (Cosign Needed)
 MEDICARE OBSERVATION STATUS NOTIFICATION   Patient Details  Name: KANTON KAMEL MRN: [id number] Date of Birth: 1946/07/30   Medicare Observation Status Notification Given:  Yes    Rosaline JONELLE Joe, RN 03/27/2024, 9:00 AM

## 2024-03-27 NOTE — Plan of Care (Signed)

## 2024-03-27 NOTE — Assessment & Plan Note (Signed)
 Nephrology reportedly discussed HD with patient per wife, but he has adamantly refused and labs remain stable outpatient. - Caution with nephrotoxic medications, IV contrast - Renal diet w/ 1200 mL fluid restriction - AM BMP

## 2024-03-27 NOTE — Assessment & Plan Note (Signed)
 Appears to be chronic.  May require further workup outpatient. B12 492. Immature retic fraction 2.0. Liver US : no gallstones or ductal dilation, trace ascites, small R pleural effusion.  - Transfusion threshold Hgb<7 - Liver US  - AM CBC

## 2024-03-27 NOTE — Progress Notes (Addendum)
  Progress Note  Patient Name: Cody Silva Date of Encounter: 03/27/2024 [facility] Cardiologist: None   Interval Summary   Patient reports feeling good Ambulated yesterday without issue Mag 2.1, K 4.2 today Eager to get discharged, will reach out to primary team to discuss placement of monitor prior to discharge   Vital Signs Vitals:   03/26/24 1627 03/26/24 2128 03/27/24 0025 03/27/24 0558  BP: (!) 164/77 (!) 175/78 (!) 147/81 (!) 155/74  Pulse: (!) 58 63 (!) 53 61  Resp: 18 20 20 20   Temp: 98 F (36.7 C) (!) 97.5 F (36.4 C) 98.1 F (36.7 C) 97.8 F (36.6 C)  TempSrc:      SpO2: 100% 100% 100% 100%  Weight:      Height:        Intake/Output Summary (Last 24 hours) at 03/27/2024 1000 Last data filed at 03/27/2024 0540 Gross per 24 hour  Intake 500 ml  Output 150 ml  Net 350 ml      03/25/2024   11:57 AM 01/03/2022    7:32 AM 12/30/2021   11:19 AM  Last 3 Weights  Weight (lbs) 165 lb 154 lb 15.7 oz 155 lb  Weight (kg) 74.844 kg 70.3 kg 70.308 kg     Telemetry/ECG  Sinus rhythm, HR 60s, frequent PVCs  - Personally Reviewed  Physical Exam  GEN: No acute distress.   Neck: No JVD Cardiac: RRR, no murmurs, rubs, or gallops.  Respiratory: Clear to auscultation bilaterally. GI: Soft, nontender, non-distended  MS: No edema  Assessment & Plan   Syncope Orthostatic hypotension Bradycardia Brief NSVT  Patient with known history of orthostatic hypotension and heat intolerance Episode prior to this admission very similar to prior episodes, prodrome and heat related (hot shower) Electrolyte imbalances likely driving ectopy this admission  Discontinued Coreg  and donepezil  Updated echocardiogram normal  Keep K > 4 and Mag > 2  Instructed to use compression socks if tolerable  Continue to monitor on cardiac telemetry while admitted Place live monitor for syncope at discharge -- will discuss with primary team on timing of discharge to arrange monitor  placement  Continue to hold antihypertensives unless SBP stays consistently over 160  Per primary Hypertension Hyperlipidemia CKD stage 5 Diabetes Pancytopenia OSA BPH History of CVA Depression    For questions or updates, please contact [facility] Please consult [email or web address] for contact info under       Signed, Waddell DELENA Donath, PA-C    Patient seen and examined with TP PA-C.  Agree as above, with the following exceptions and changes as noted below. No CP, no recurrent lightheadedness or syncope. Eager to go home, feels he is not himself here. Increased pvcs on tele but HR improved, likely will be a balancing act between low dose BB and PVC burden. Will evaluate this with monitor, and can add back metoprolol at follow up. Gen: NAD, CV: RRR, no murmurs, Lungs: clear, Abd: soft, Extrem: Warm, well perfused, no edema, Neuro/Psych: alert and oriented x 3, normal mood and affect. All available labs, radiology testing, previous records reviewed. Will place cardiac monitor prior to discharge.  OK to add back low dose amlodipine  for BP control.  No further cardiovascular recommendations inpatient.   Eren Ryser A Chicquita Mendel, MD 03/27/24 11:06 AM

## 2024-03-27 NOTE — Assessment & Plan Note (Signed)
 HTN - Hold hydralazine  50 mg BID, carvedilol  12.5 mg BID, amlodipine  10 mg daily, normotensive, clonidine  0.1 mg weekly HLD - Restart atorvastatin  40 mg OSA - Ordered CPAP, patient reports he wears inconsistently BPH - Restart home finasteride  5 mg Hx CVA - Restart home clopidogrel  75 mg Depression - Restart escitalopram  10 mg

## 2024-03-27 NOTE — Assessment & Plan Note (Signed)
 A1c 4.5. Well controlled.  - No SSI or CBGs at this time

## 2024-03-27 NOTE — Care Management CC44 (Cosign Needed)
 Condition Code 44 Documentation Completed  Patient Details  Name: Cody Silva MRN: [id number] Date of Birth: 03/24/1946   Condition Code 44 given:  Yes Patient signature on Condition Code 44 notice:  Yes Documentation of 2 MD's agreement:  Yes Code 44 added to claim:  Yes    Rosaline JONELLE Joe, RN 03/27/2024, 9:00 AM

## 2024-03-27 NOTE — Progress Notes (Signed)
ZIO AT applied at hospital Dr. Acharya to read. 

## 2024-03-27 NOTE — Assessment & Plan Note (Deleted)
 Patient stable. Echo LVEF 60-65%, mild concentric hypertrophy, no wall abnormalities, RV size and function normal. Mild MV regurgitation, tricuspid valve mild regurgitation and calcification, but no stenosis. Positive orthostatics. MRI - negative for stroke, chronic ischemic changes.  - Continuous cardiac monitoring - TTE ordered, previous echo in 2021 LVEF 50-55% - PT and OT evaluation and treat - Delirium precautions, fall precautions - Hold carvedilol  25 mg given bradycardia - Hold donepezil given hypotension and confusion side effects - Cardiology consult given VT on monitor and presenting symptoms  - compression stockings   - cardiac monitor  - AM CBC, BMP, Magnesium 

## 2024-03-27 NOTE — Discharge Summary (Addendum)
 Family Medicine Teaching [facility] Cody Silva Discharge Summary  Patient name: Cody Silva Medical record number: [id number] Date of birth: 1946/06/03 Age: 78 y.o. Gender: male Date of Admission: 03/25/2024  Date of Discharge: 03/27/2024 Admitting Physician: Raguel KANDICE Lee, DO  Primary Care Provider: [facility] Consultants: Neurology  Indication for Hospitalization: Syncope and fall  Discharge Diagnoses/Problem List:  Principal Problem for Admission: Syncope Other Problems addressed during stay:  Principal Problem:   Syncope Active Problems:   CKD (chronic kidney disease), stage V (HCC)   Pancytopenia (HCC)   Chronic health problem   Type 2 diabetes mellitus [facility])   Brief Hospital Course:  Cody Silva is a 78 y.o. year old with a history of brain impairment, CVA, CKD stage V, T2DM who presented with syncope and fall and was admitted to the [facility] Medicine Teaching Service for syncope.  Syncope Fall Syncope and fall on 03/25/2024.  Patient became lightheaded, dizzy, and felt like his legs gave out while taking a shower and fell.  Per EMS his heart rate was in the low 40s.  CT head showed no acute abnormality.  Hit his right shoulder, x-ray was negative for fracture.  Patient had an EEG reviewed by neurology was normal.  Orthostatic vitals were positive.  TTE showed LVEF of 60 to 65%, mild concentric hypertrophy, no wall abnormalities, RV size and function normal.  MRI brain without contrast that showed no acute abnormalities. On 7/23 patient had 15 beats of V. tach on telemetry and cardiology was consulted.  Carvedilol  and donepezil were held this admission, cardiology agreed and these medications were discontinued on admission. Cardiology felt this episode of syncope was not due to bradycardia, however, most likely vasovagal syncope, recommended compression stockings.  Cardiology placed a live cardiac monitor to evaluate arrhythmia burden.  Pancytopenia Appears  to be chronic.  Ferritin and iron panel were unremarkable.  B12 was normal.  Immature retic fraction was low.  Liver US  showed no gallstones or ductal dilatation, trace ascites, small right pleural effusion.  Recommend outpatient follow up.  Other chronic conditions were medically managed with home medications and formulary alternatives as necessary.  PCP Follow-up Recommendations: Cardiac monitor to assess arrhythmia burden + cardiology follow-up Ensure use of compression stockings + slowly getting up Consider further evaluation for pancytopenia outpatient  Results/Tests Pending at Time of Discharge:  Unresulted Labs (From admission, onward)    None      Disposition: Home with home health PT/OT  Discharge Condition: Likely stable for discharge  Discharge Exam:  Vitals:   03/27/24 1015 03/27/24 1322  BP: (!) 151/74 (!) 185/85  Pulse: 67 65  Resp: 19 19  Temp: 98.2 F (36.8 C) 98.1 F (36.7 C)  SpO2: 100% 100%   General: Awake, alert, no acute distress Cardiovascular: RRR, no M/R/T Respiratory: CTAB, normal work of breathing on room air Abdomen: Flat, soft, nondistended, nontender to palpation, bowel sounds present Extremities: Thin, no lower extremity edema  Significant Procedures: None  Significant Labs and Imaging:  Recent Labs  Lab 03/26/24 0216 03/27/24 0212  WBC 2.8* 2.9*  HGB 8.7* 8.7*  HCT 25.8* 26.5*  PLT 124* 124*   Recent Labs  Lab 03/26/24 0216 03/27/24 0212  NA 141 142  K 3.7 4.2  CL 113* 115*  CO2 19* 20*  GLUCOSE 99 83  BUN 43* 41*  CREATININE 4.45* 4.29*  CALCIUM  8.9 9.2  MG 1.6* 2.1    Pertinent Imaging    CT head No CT evidence  of acute intracranial abnormality. 2.   Chronic microvascular ischemic changes. 3.   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  MRI brain without contrast 1. No acute intracranial abnormality. 2. Moderate chronic small vessel ischemic disease.  Ultrasound abdomen limited RUQ No  gallstones or ductal dilatation. Trace ascites. Small right pleural effusion.   Discharge Medications:  Allergies as of 03/27/2024       Reactions   Metformin And Related Diarrhea   Amlodipine  Swelling   Edema. Wife stated the pt is not allergic to this medication.    Felodipine Swelling   Edema    Lactose Intolerance (gi) Other (See Comments)   Gi upset   Pseudoephedrine-acetaminophen  Nausea And Vomiting   Terazosin Other (See Comments)   syncope   Tylenol  With Codeine #3 [acetaminophen -codeine] Nausea Only   Patient can take regular tylenol  with no problems   Tape Rash        Medication List     STOP taking these medications    carvedilol  25 MG tablet Commonly known as: COREG    donepezil 5 MG tablet Commonly known as: ARICEPT   finasteride  5 MG tablet Commonly known as: PROSCAR    hydrALAZINE  50 MG tablet Commonly known as: APRESOLINE    insulin  aspart 100 UNIT/ML injection Commonly known as: novoLOG    Vitamin D  (Ergocalciferol ) 1.25 MG (50000 UNIT) Caps capsule Commonly known as: DRISDOL       TAKE these medications    acetaminophen  500 MG tablet Commonly known as: TYLENOL  Take 1 tablet (500 mg total) by mouth every 6 (six) hours as needed for mild pain, moderate pain, fever or headache. What changed:  how much to take when to take this   albuterol  108 (90 Base) MCG/ACT inhaler Commonly known as: VENTOLIN  HFA Inhale 1 puff into the lungs every 6 (six) hours as needed for wheezing or shortness of breath.   amLODipine  5 MG tablet Commonly known as: NORVASC  Take 1 tablet (5 mg total) by mouth at bedtime. What changed:  medication strength how much to take   ammonium lactate 12 % lotion Commonly known as: LAC-HYDRIN Apply 1 application topically as needed for dry skin.   atorvastatin  40 MG tablet Commonly known as: LIPITOR Take 40 mg by mouth at bedtime.   calcitRIOL 0.25 MCG capsule Commonly known as: ROCALTROL Take 0.25 mcg by mouth every  Monday, Wednesday, and Friday.   cloNIDine  0.1 mg/24hr patch Commonly known as: CATAPRES  - Dosed in mg/24 hr Place 0.1 mg onto the skin once a week.   clopidogrel  75 MG tablet Commonly known as: PLAVIX  Take 1 tablet (75 mg total) by mouth at bedtime. What changed: when to take this   dorzolamide -timolol  2-0.5 % ophthalmic solution Commonly known as: COSOPT  Place 1 drop into both eyes 2 (two) times daily.   escitalopram  10 MG tablet Commonly known as: LEXAPRO  Take 10 mg by mouth at bedtime.   ferrous sulfate 325 (65 FE) MG tablet Take 325 mg by mouth every Monday, Wednesday, and Friday.   fluticasone  50 MCG/ACT nasal spray Commonly known as: FLONASE  Place 1 spray into both nostrils as needed for allergies.   gabapentin  300 MG capsule Commonly known as: NEURONTIN  Take 300 mg by mouth at bedtime.   latanoprost  0.005 % ophthalmic solution Commonly known as: XALATAN  Place 1 drop into both eyes at bedtime.   terbinafine 1 % cream Commonly known as: LAMISIL Apply 1 application  topically daily as needed (irritation (fungal)).   tolnaftate 1 % powder Commonly known  as: TINACTIN Apply 1 application  topically daily as needed (jock itch).       Discharge Instructions: Please refer to Patient Instructions section of EMR for full details.  Patient was counseled important signs and symptoms that should prompt return to medical care, changes in medications, dietary instructions, activity restrictions, and follow up appointments.   Follow-Up Appointments:  Follow-up Information     Care, [facility] Follow up.   Specialty: Home Health Services Why: [facility] will provide home health services.  They will call you in the next 24-48 hours to set up services. Contact information: [address] [place] [address] [phone]         Clinic, [facility] Schedule an appointment as soon as possible for a visit in 1 week(s).   Contact information: [address]  [facility] [place] [address] [phone]                [name], DO 03/27/2024, 6:45 PM PGY-1, [facility]    FMTS Upper-Level Resident Attestation I have independently interviewed and examined the patient. I have discussed the above with Dr. Lennie and agree with the documented plan. My edits for correction/addition/clarification are included above. Please see any attending notes.  Rogelio Waynick Toma, MD PGY-2, [facility] 03/27/2024 7:51 PM FPTS Service pager: [phone] (text pages welcome through AMION)

## 2024-03-27 NOTE — TOC Progression Note (Signed)
 Transition of Care [facility]) - Progression Note    Patient Details  Name: Cody Silva MRN: [id number] Date of Birth: 09-23-1945  Transition of Care [facility]) CM/SW Contact  Rosaline JONELLE Joe, RN Phone Number: 03/27/2024, 9:02 AM  Clinical Narrative:    CM met with the patient at the bedside,  [facility] letter provided and Code 44 completed.                     Expected Discharge Plan and Services                                               Social Drivers of Health (SDOH) Interventions SDOH Screenings   Food Insecurity: No Food Insecurity (03/26/2024)  Housing: Low Risk  (03/26/2024)  Transportation Needs: No Transportation Needs (03/26/2024)  Utilities: Not At Risk (03/26/2024)  Social Connections: Moderately Integrated (03/26/2024)  Tobacco Use: Low Risk  (04/04/2023)    Readmission Risk Interventions    03/26/2024   10:14 AM  Readmission Risk Prevention Plan  Transportation Screening Complete  PCP or Specialist Appt within 5-7 Days Complete  Home Care Screening Complete  Medication Review (RN CM) Complete

## 2024-04-08 NOTE — Progress Notes (Unsigned)
 Cardiology Office Note:    Date:  04/08/2024   ID:  Cody Silva, Cody Silva 01/04/1946, MRN [id number]  PCP:  Clinic, Cody Silva  Cardiologist:  None { Click to update primary MD,subspecialty MD or APP then REFRESH:1}    Referring MD: Clinic, Cody Silva   Chief Complaint: hospital follow-up of near syncope   History of Present Illness:    Cody Silva is a 78 y.o. 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 Loni and presents today for hospital follow-up of near syncope.   Patient has a long history of recurrent near syncope/ syncope felt to be orthostatic and vasovagal in nature. He previously had an implantable loop recorder placed back in 2017 after a CVA and had episodes of presyncope/ syncope while this was in place with no significant arrhythmias detected. He also underwent work-up by Neurology in 2017/2018 due to concern for seizures and was actually started on Depakote . However, work-up for seizures was negative and Depakote  was later stopped.   Patient was recently admitted from 03/25/2024 to 03/27/2024 for  near syncope syncope that occurred when he was in the shower. He got up to get out and felt lightheaded/ dizzy, fatigued, and diaphoretic. He fell and hit his head but never lost consciousness. This was similar to his prior episodes of near presyncope/ syncope. However, wife noticed some confusion after the fall. Head CT and Brain MRI were unremarkable and EEG was normal. Echo showed LVEF of 60-65% with normal wall motion and mild LVH, normal RV function, mild AI, and mild MR. HE was noted to have 2 short runs of NSVT on telemetry (longest episode  11 beats). Home Coreg  and Donepezil were stopped due to baseline bradycardia. Syncope was not felt to be due to bradycardiac or NSVT and sounded vasovagal in nature due to vasodilation in a hot shower but 2 week Zio monitor was ordered to ensure no significant arrhythmias.   Patient presents today for ***  Recurrent Near Syncope/ Syncope  Patient has a long history of near syncope/ syncope felt to be orthostatic and vasovagal in nature. Prior loop recorder showed no significant arrhythmias. He was recently admitted in 03/2024 for near syncope that again sounded vasovagal in nature due to vasodilation in a hot shower. Echo showed normal LV function with no significant valvular disease. Head CT/ Brain MRI showed no acute findings and EEG was negative. He was noted to be mildly bradycardic and also had 2 brief episodes of NSVT on telemetry. Home Coreg  and Donepezil were stopped due to baseline mild bradycardia. Mild bradycardiac and NSVT were not felt to be the cause of his near syncope but outpatient monitor was ordered to ensure no significant arrhythmias. Monitor showed ***.  - ***  Hypertension Orthostatic Hypotension Patient has a history of hypertension complicated by orthostatic hypotension.  - BP *** - Continue current medications: Amlodipine  5mg  daily and Clonidine  0.1mg  patch per week.  - Will need to allow for some degree of permissive hypertension given history of orthostatic hypotension.  Continue conservative management such as staying well hydrated, changing positions slowly, and wearing compression stockings.   Ascending Thoracic Aortic Aneurysm Chest MRA in 02/2020 showed 4.2cm ascending thoracic aortic aneurysm.  [facility]  Mild Mitral Regurgitation Mild Aortic Insufficiency Noted on Echo in 03/2024.  - Can continue routine monitoring with serial Echos per guidelines.   Hyperlipidemia Lipid panel in ***: - Continue Lipitor 40mg  daily.   Type 2 Diabetes  Mellitus Hemoglobin A1c 4.5% in 03/2024.  - Management per PCP.  CKD Stage V Baseline creatinine in the 4 to 5 range. He has previously declined dialysis.  - Management per Nephrology.     EKGs/Labs/Other Studies Reviewed:    The following studies were reviewed:  Echocardiogram 03/26/2024: 1. Left ventricular ejection fraction, by estimation, is 60 to 65%. The  left ventricle has normal function. The left ventricle has no regional  wall motion abnormalities. There is mild concentric left ventricular  hypertrophy. Left ventricular diastolic  parameters were normal.   2. Right ventricular systolic function is normal. The right ventricular  size is normal.   3. The mitral valve is normal in structure. Mild mitral valve  regurgitation. No evidence of mitral stenosis.   4. The aortic valve is tricuspid. There is mild calcification of the  aortic valve. Aortic valve regurgitation is mild. Aortic valve  sclerosis/calcification is present, without any evidence of aortic  stenosis.   5. The inferior vena cava is normal in size with greater than 50%  respiratory variability, suggesting right atrial pressure of 3 mmHg.    EKG:  EKG *** ordered today. EKG personally reviewed and demonstrates ***.  Recent Labs: 03/25/2024: TSH 1.506 03/27/2024: BUN 41; Creatinine, Ser 4.29; Hemoglobin 8.7; Magnesium  2.1; Platelets 124; Potassium 4.2; Sodium 142  Recent Lipid Panel    Component Value Date/Time   CHOL 105 01/10/2017 0430   TRIG 40 01/10/2017 0430   HDL 52 01/10/2017 0430   CHOLHDL 2.0 01/10/2017 0430   VLDL 8 01/10/2017 0430   LDLCALC 45 01/10/2017 0430    Physical Exam:    Vital Signs: There were no vitals taken for this visit.    Wt Readings from Last 3 Encounters:  03/25/24 165 lb (74.8 kg)  01/03/22 154 lb 15.7 oz (70.3 kg)  12/30/21 155 lb (70.3 kg)     General: 78 y.o. male in no acute distress. HEENT: Normocephalic and atraumatic. Sclera clear.  Neck: Supple. No carotid  bruits. No JVD. Heart: *** RRR. Distinct S1 and S2. No murmurs, gallops, or rubs.  Lungs: No increased work of breathing. Clear to ausculation bilaterally. No wheezes, rhonchi, or rales.  Abdomen: Soft, non-distended, and non-tender to palpation.  Extremities: No lower extremity edema.  Radial and distal pedal pulses 2+ and equal bilaterally. Skin: Warm and dry. Neuro: No focal deficits. Psych: Normal affect. Responds appropriately.   Assessment:    No diagnosis found.  Plan:     Disposition: Follow up in ***   Signed, Aamari Strawderman E Jesselee Poth, PA-C  04/08/2024 4:05 PM    [facility]

## 2024-04-22 ENCOUNTER — Ambulatory Visit: Attending: Cardiology | Admitting: Student

## 2024-04-22 ENCOUNTER — Encounter: Payer: Self-pay | Admitting: Student

## 2024-04-22 VITALS — BP 152/72 | HR 62 | Ht 76.0 in | Wt 152.0 lb

## 2024-04-22 DIAGNOSIS — R55 Syncope and collapse: Secondary | ICD-10-CM

## 2024-04-22 DIAGNOSIS — I10 Essential (primary) hypertension: Secondary | ICD-10-CM

## 2024-04-22 DIAGNOSIS — I340 Nonrheumatic mitral (valve) insufficiency: Secondary | ICD-10-CM | POA: Diagnosis not present

## 2024-04-22 DIAGNOSIS — I7121 Aneurysm of the ascending aorta, without rupture: Secondary | ICD-10-CM | POA: Diagnosis not present

## 2024-04-22 DIAGNOSIS — I951 Orthostatic hypotension: Secondary | ICD-10-CM

## 2024-04-22 DIAGNOSIS — E785 Hyperlipidemia, unspecified: Secondary | ICD-10-CM

## 2024-04-22 DIAGNOSIS — E118 Type 2 diabetes mellitus with unspecified complications: Secondary | ICD-10-CM

## 2024-04-22 DIAGNOSIS — I351 Nonrheumatic aortic (valve) insufficiency: Secondary | ICD-10-CM

## 2024-04-22 DIAGNOSIS — N185 Chronic kidney disease, stage 5: Secondary | ICD-10-CM

## 2024-04-22 MED ORDER — HYDRALAZINE HCL 10 MG PO TABS: ORAL_TABLET | ORAL | 3 refills | Status: DC

## 2024-04-22 MED ORDER — HYDRALAZINE HCL 10 MG PO TABS: ORAL_TABLET | ORAL | 2 refills | Status: AC

## 2024-04-22 NOTE — Patient Instructions (Addendum)
 Medication Instructions:  Your physician has recommended you make the following change in your medication:  START HYDRALAZINE  10 MG TWICE DAILY AS NEEDED IF BLOOD PRESSURE IS 160/100 OR GREATER. IF BLOOD PRESSURE IS 160/100 OR GREATER WAIT 10 MINUTES AND THEN CHECK IT AGAIN   *If you need a refill on your cardiac medications before your next appointment, please call your pharmacy*  Lab Work: NONE If you have labs (blood work) drawn today and your tests are completely normal, you will receive your results only by: MyChart Message (if you have MyChart) OR A paper copy in the mail If you have any lab test that is abnormal or we need to change your treatment, we will call you to review the results.  Testing/Procedures: NONE  Follow-Up: At [facility], you and your health needs are our priority.  As part of our continuing mission to provide you with exceptional heart care, our providers are all part of one team.  This team includes your primary Cardiologist (physician) and Advanced Practice Providers or APPs (Physician Assistants and Nurse Practitioners) who all work together to provide you with the care you need, when you need it.  Your next appointment:   3 month(s)  Provider:   Gayatri A Acharya, MD or Callie Goodrich, PA-C  We recommend signing up for the patient portal called MyChart.  Sign up information is provided on this After Visit Summary.  MyChart is used to connect with patients for Virtual Visits (Telemedicine).  Patients are able to view lab/test results, encounter notes, upcoming appointments, etc.  Non-urgent messages can be sent to your provider as well.   To learn more about what you can do with MyChart, go to [email or web address].   Other Instructions Please check your blood pressure 1-2 times per day for 2 weeks and send readings to Callie Goodrich, PA-C.    Orthostatic Hypotension Blood pressure is a measurement of how strongly, or weakly, your  circulating blood is pressing against the walls of your arteries. Orthostatic hypotension is a drop in blood pressure that can happen when you change positions, such as when you go from lying down to standing. Arteries are blood vessels that carry blood from your heart throughout your body. When blood pressure is too low, you may not get enough blood to your brain or to the rest of your organs. Orthostatic hypotension can cause light-headedness, sweating, rapid heartbeat, blurred vision, and fainting. These symptoms require further investigation into the cause. What are the causes? Orthostatic hypotension can be caused by many things, including: Sudden changes in posture, such as standing up quickly after you have been sitting or lying down. Loss of blood (anemia) or loss of body fluids (dehydration). Heart problems, neurologic problems, or hormone problems. Pregnancy. Aging. The risk for this condition increases as you get older. Severe infection (sepsis). Certain medicines, such as medicines for high blood pressure or medicines that make the body lose excess fluids (diuretics). What are the signs or symptoms? Symptoms of this condition may include: Weakness, light-headedness, or dizziness. Sweating. Blurred vision. Tiredness (fatigue). Rapid heartbeat. Fainting, in severe cases. How is this diagnosed? This condition is diagnosed based on: Your symptoms and medical history. Your blood pressure measurements. Your health care provider will check your blood pressure when you are: Lying down. Sitting. Standing. A blood pressure reading is recorded as two numbers, such as 120 over 80 (or 120/80). The first (top) number is called the systolic pressure. It is a measure of the pressure in  your arteries as your heart beats. The second (bottom) number is called the diastolic pressure. It is a measure of the pressure in your arteries when your heart relaxes between beats. Blood pressure is  measured in a unit called mmHg. Healthy blood pressure for most adults is 120/80 mmHg. Orthostatic hypotension is defined as a 20 mmHg drop in systolic pressure or a 10 mmHg drop in diastolic pressure within 3 minutes of standing. Other information or tests that may be used to diagnose orthostatic hypotension include: Your other vital signs, such as your heart rate and temperature. Blood tests. An electrocardiogram (ECG) or echocardiogram. A Holter monitor. This is a device you wear that records your heart rhythm continuously, usually for 24-48 hours. Tilt table test. For this test, you will be safely secured to a table that moves you from a lying position to an upright position. Your heart rhythm and blood pressure will be monitored during the test. How is this treated? This condition may be treated by: Changing your diet. This may involve eating more salt (sodium) or drinking more water. Changing the dosage of certain medicines you are taking that might be lowering your blood pressure. Correcting the underlying reason for the orthostatic hypotension. Wearing compression stockings. Taking medicines to raise your blood pressure. Avoiding actions that trigger symptoms. Follow these instructions at home: Medicines Take over-the-counter and prescription medicines only as told by your health care provider. Follow instructions from your health care provider about changing the dosage of your current medicines, if this applies. Do not stop or adjust any of your medicines on your own. Eating and drinking  Drink enough fluid to keep your urine pale yellow. Eat extra salt only as directed. Do not add extra salt to your diet unless advised by your health care provider. Eat frequent, small meals. Avoid standing up suddenly after eating. General instructions  Get up slowly from lying down or sitting positions. This gives your blood pressure a chance to adjust. Avoid hot showers and excessive heat as  directed by your health care provider. Engage in regular physical activity as directed by your health care provider. If you have compression stockings, wear them as told. Keep all follow-up visits. This is important. Contact a health care provider if: You have a fever for more than 2-3 days. You feel more thirsty than usual. You feel dizzy or weak. Get help right away if: You have chest pain. You have a fast or irregular heartbeat. You become sweaty or feel light-headed. You feel short of breath. You faint. You have any symptoms of a stroke. BE FAST is an easy way to remember the main warning signs of a stroke: B - Balance. Signs are dizziness, sudden trouble walking, or loss of balance. E - Eyes. Signs are trouble seeing or a sudden change in vision. F - Face. Signs are sudden weakness or numbness of the face, or the face or eyelid drooping on one side. A - Arms. Signs are weakness or numbness in an arm. This happens suddenly and usually on one side of the body. S - Speech. Signs are sudden trouble speaking, slurred speech, or trouble understanding what people say. T - Time. Time to call emergency services. Write down what time symptoms started. You have other signs of a stroke, such as: A sudden, severe headache with no known cause. Nausea or vomiting. Seizure. These symptoms may represent a serious problem that is an emergency. Do not wait to see if the symptoms will go away.  Get medical help right away. Call your local emergency services (911 in the [place] Do not drive yourself to the hospital. Summary Orthostatic hypotension is a sudden drop in blood pressure. It can cause light-headedness, sweating, rapid heartbeat, blurred vision, and fainting. Orthostatic hypotension can be diagnosed by having your blood pressure taken while lying down, sitting, and then standing. Treatment may involve changing your diet, wearing compression stockings, sitting up slowly, adjusting your  medicines, or correcting the underlying reason for the orthostatic hypotension. Get help right away if you have chest pain, a fast or irregular heartbeat, or symptoms of a stroke. This information is not intended to replace advice given to you by your health care provider. Make sure you discuss any questions you have with your health care provider. Document Revised: 11/04/2020 Document Reviewed: 11/04/2020 Elsevier Patient Education  2024 [employer].

## 2024-04-24 NOTE — Addendum Note (Signed)
 Encounter addended by: Malvina Pina A on: 04/24/2024 4:23 PM  Actions taken: Imaging Exam ended

## 2024-04-26 DIAGNOSIS — R55 Syncope and collapse: Secondary | ICD-10-CM | POA: Diagnosis not present

## 2024-04-28 ENCOUNTER — Ambulatory Visit: Payer: Self-pay | Admitting: Cardiology

## 2024-04-29 ENCOUNTER — Telehealth: Payer: Self-pay | Admitting: Internal Medicine

## 2024-04-29 NOTE — Telephone Encounter (Signed)
 [facility] PT is requesting a callback regarding pt seen in office on recently, was told to take TAKE HYDRALAZINE  10 MG BY MOUTH TWICE DAILY AS NEEDED IF BLOOD PRESSURE IS 160/100.SABRASABRASABRAHis bp is not being checked due to pt being blind and mobility. Reported later that night he had a sudden weakness on his left side that had continued, his pcp through [place] Dr. Ellouise on the 22nd said he had cat scan and was told it was negative. I'd like to verify which arm does need bp readings on. Requesting home health nurse evalutation. [facility]. Please advise

## 2024-04-29 NOTE — Telephone Encounter (Signed)
 Left message for Signe Eastern to call back to discuss his concerns.

## 2024-04-30 ENCOUNTER — Telehealth: Payer: Self-pay | Admitting: Internal Medicine

## 2024-04-30 NOTE — Telephone Encounter (Signed)
 Called Jenna with [facility].  Reports pt needs PT but PCP will not sign orders because they are the [place] and pt is using [employer] to cover PT. Per Jenna pt was hospitalized in July for syncope, pancytopenia, and a fall. Advised Jenna that our providers don't usually sign these orders.  Reports there is no other providers that can sign.  Will send to provider for review.

## 2024-04-30 NOTE — Telephone Encounter (Signed)
 Calling to see if the dr can signed off on a home health plan of care for physical therapy. Please advise

## 2024-05-01 NOTE — Telephone Encounter (Signed)
 See 8/27 telephone note

## 2024-05-06 NOTE — Telephone Encounter (Signed)
 Called and spoke to Jenna. Dr. Laurence comments given to her. Andriette states she has already reached out to [facility] Medicine team (Dr. Camie Mulch) and they are not able to provide orders/follow up for PT, unfortunately. Andriette states she will call pt and notify him of the need to discharge him from PT program for now. Pt has OV appt with Dr. Loni on 07/21/24. Jenna also states this does happen very frequently, as the [place] is not able to sign PT orders.

## 2024-05-09 ENCOUNTER — Encounter: Payer: Self-pay | Admitting: *Deleted

## 2024-07-18 ENCOUNTER — Encounter: Payer: Self-pay | Admitting: *Deleted

## 2024-07-21 ENCOUNTER — Ambulatory Visit: Attending: Internal Medicine | Admitting: Internal Medicine

## 2024-07-22 ENCOUNTER — Encounter: Payer: Self-pay | Admitting: Internal Medicine

## 2024-08-25 ENCOUNTER — Other Ambulatory Visit: Payer: Self-pay

## 2024-08-25 ENCOUNTER — Inpatient Hospital Stay (HOSPITAL_COMMUNITY)
Admission: EM | Admit: 2024-08-25 | Discharge: 2024-09-10 | DRG: 660 | Disposition: A | Discharge status: Skilled Nursing Facility | Attending: Internal Medicine | Admitting: Internal Medicine

## 2024-08-25 ENCOUNTER — Encounter (HOSPITAL_COMMUNITY): Payer: Self-pay

## 2024-08-25 DIAGNOSIS — Z8679 Personal history of other diseases of the circulatory system: Secondary | ICD-10-CM

## 2024-08-25 DIAGNOSIS — N99820 Postprocedural hemorrhage and hematoma of a genitourinary system organ or structure following a genitourinary system procedure: Secondary | ICD-10-CM | POA: Diagnosis not present

## 2024-08-25 DIAGNOSIS — E1140 Type 2 diabetes mellitus with diabetic neuropathy, unspecified: Secondary | ICD-10-CM | POA: Diagnosis present

## 2024-08-25 DIAGNOSIS — D696 Thrombocytopenia, unspecified: Secondary | ICD-10-CM | POA: Diagnosis present

## 2024-08-25 DIAGNOSIS — G8929 Other chronic pain: Secondary | ICD-10-CM | POA: Diagnosis present

## 2024-08-25 DIAGNOSIS — Z833 Family history of diabetes mellitus: Secondary | ICD-10-CM

## 2024-08-25 DIAGNOSIS — N209 Urinary calculus, unspecified: Secondary | ICD-10-CM | POA: Diagnosis present

## 2024-08-25 DIAGNOSIS — Z803 Family history of malignant neoplasm of breast: Secondary | ICD-10-CM

## 2024-08-25 DIAGNOSIS — H548 Legal blindness, as defined in USA: Secondary | ICD-10-CM | POA: Diagnosis present

## 2024-08-25 DIAGNOSIS — N189 Chronic kidney disease, unspecified: Principal | ICD-10-CM

## 2024-08-25 DIAGNOSIS — E785 Hyperlipidemia, unspecified: Secondary | ICD-10-CM | POA: Diagnosis present

## 2024-08-25 DIAGNOSIS — N184 Chronic kidney disease, stage 4 (severe): Secondary | ICD-10-CM | POA: Diagnosis present

## 2024-08-25 DIAGNOSIS — I69319 Unspecified symptoms and signs involving cognitive functions following cerebral infarction: Secondary | ICD-10-CM

## 2024-08-25 DIAGNOSIS — Z8419 Family history of other disorders of kidney and ureter: Secondary | ICD-10-CM

## 2024-08-25 DIAGNOSIS — Z885 Allergy status to narcotic agent status: Secondary | ICD-10-CM

## 2024-08-25 DIAGNOSIS — Z91048 Other nonmedicinal substance allergy status: Secondary | ICD-10-CM

## 2024-08-25 DIAGNOSIS — Z91011 Allergy to milk products, unspecified: Secondary | ICD-10-CM

## 2024-08-25 DIAGNOSIS — Z8249 Family history of ischemic heart disease and other diseases of the circulatory system: Secondary | ICD-10-CM

## 2024-08-25 DIAGNOSIS — N185 Chronic kidney disease, stage 5: Secondary | ICD-10-CM | POA: Diagnosis present

## 2024-08-25 DIAGNOSIS — Z860100 Personal history of colon polyps, unspecified: Secondary | ICD-10-CM

## 2024-08-25 DIAGNOSIS — Z7902 Long term (current) use of antithrombotics/antiplatelets: Secondary | ICD-10-CM

## 2024-08-25 DIAGNOSIS — R338 Other retention of urine: Secondary | ICD-10-CM | POA: Diagnosis present

## 2024-08-25 DIAGNOSIS — G4733 Obstructive sleep apnea (adult) (pediatric): Secondary | ICD-10-CM | POA: Diagnosis present

## 2024-08-25 DIAGNOSIS — E1159 Type 2 diabetes mellitus with other circulatory complications: Secondary | ICD-10-CM | POA: Diagnosis present

## 2024-08-25 DIAGNOSIS — D62 Acute posthemorrhagic anemia: Secondary | ICD-10-CM | POA: Diagnosis not present

## 2024-08-25 DIAGNOSIS — H409 Unspecified glaucoma: Secondary | ICD-10-CM | POA: Diagnosis present

## 2024-08-25 DIAGNOSIS — Z886 Allergy status to analgesic agent status: Secondary | ICD-10-CM

## 2024-08-25 DIAGNOSIS — C412 Malignant neoplasm of vertebral column: Secondary | ICD-10-CM | POA: Diagnosis present

## 2024-08-25 DIAGNOSIS — M4856XA Collapsed vertebra, not elsewhere classified, lumbar region, initial encounter for fracture: Secondary | ICD-10-CM | POA: Diagnosis present

## 2024-08-25 DIAGNOSIS — N179 Acute kidney failure, unspecified: Principal | ICD-10-CM | POA: Diagnosis present

## 2024-08-25 DIAGNOSIS — F05 Delirium due to known physiological condition: Secondary | ICD-10-CM | POA: Diagnosis not present

## 2024-08-25 DIAGNOSIS — Z79899 Other long term (current) drug therapy: Secondary | ICD-10-CM

## 2024-08-25 DIAGNOSIS — Z888 Allergy status to other drugs, medicaments and biological substances status: Secondary | ICD-10-CM

## 2024-08-25 DIAGNOSIS — N400 Enlarged prostate without lower urinary tract symptoms: Secondary | ICD-10-CM | POA: Insufficient documentation

## 2024-08-25 DIAGNOSIS — N138 Other obstructive and reflux uropathy: Secondary | ICD-10-CM | POA: Diagnosis present

## 2024-08-25 DIAGNOSIS — N401 Enlarged prostate with lower urinary tract symptoms: Secondary | ICD-10-CM | POA: Diagnosis present

## 2024-08-25 DIAGNOSIS — I160 Hypertensive urgency: Secondary | ICD-10-CM | POA: Diagnosis present

## 2024-08-25 DIAGNOSIS — Y838 Other surgical procedures as the cause of abnormal reaction of the patient, or of later complication, without mention of misadventure at the time of the procedure: Secondary | ICD-10-CM | POA: Diagnosis not present

## 2024-08-25 DIAGNOSIS — R410 Disorientation, unspecified: Secondary | ICD-10-CM | POA: Insufficient documentation

## 2024-08-25 DIAGNOSIS — E11319 Type 2 diabetes mellitus with unspecified diabetic retinopathy without macular edema: Secondary | ICD-10-CM | POA: Diagnosis present

## 2024-08-25 DIAGNOSIS — F4310 Post-traumatic stress disorder, unspecified: Secondary | ICD-10-CM | POA: Diagnosis present

## 2024-08-25 DIAGNOSIS — E119 Type 2 diabetes mellitus without complications: Secondary | ICD-10-CM

## 2024-08-25 DIAGNOSIS — N201 Calculus of ureter: Secondary | ICD-10-CM

## 2024-08-25 DIAGNOSIS — N132 Hydronephrosis with renal and ureteral calculous obstruction: Principal | ICD-10-CM | POA: Diagnosis present

## 2024-08-25 DIAGNOSIS — N139 Obstructive and reflux uropathy, unspecified: Secondary | ICD-10-CM | POA: Insufficient documentation

## 2024-08-25 DIAGNOSIS — Z823 Family history of stroke: Secondary | ICD-10-CM

## 2024-08-25 DIAGNOSIS — Z5682 Military deployment status: Secondary | ICD-10-CM

## 2024-08-25 DIAGNOSIS — I152 Hypertension secondary to endocrine disorders: Secondary | ICD-10-CM | POA: Diagnosis present

## 2024-08-25 DIAGNOSIS — E1122 Type 2 diabetes mellitus with diabetic chronic kidney disease: Secondary | ICD-10-CM | POA: Diagnosis present

## 2024-08-25 DIAGNOSIS — Z8673 Personal history of transient ischemic attack (TIA), and cerebral infarction without residual deficits: Secondary | ICD-10-CM

## 2024-08-25 DIAGNOSIS — I69351 Hemiplegia and hemiparesis following cerebral infarction affecting right dominant side: Secondary | ICD-10-CM

## 2024-08-25 DIAGNOSIS — F32A Depression, unspecified: Secondary | ICD-10-CM | POA: Diagnosis present

## 2024-08-25 DIAGNOSIS — Z808 Family history of malignant neoplasm of other organs or systems: Secondary | ICD-10-CM

## 2024-08-25 DIAGNOSIS — R310 Gross hematuria: Secondary | ICD-10-CM | POA: Diagnosis not present

## 2024-08-25 LAB — URINALYSIS, ROUTINE W REFLEX MICROSCOPIC
Bacteria, UA: NONE SEEN
Bilirubin Urine: NEGATIVE
Glucose, UA: 50 mg/dL — AB
Ketones, ur: NEGATIVE mg/dL
Nitrite: NEGATIVE
Protein, ur: >=300 mg/dL — AB
Specific Gravity, Urine: 1.013 (ref 1.005–1.030)
pH: 5 (ref 5.0–8.0)

## 2024-08-25 LAB — CBC
HCT: 30.6 % — ABNORMAL LOW (ref 39.0–52.0)
Hemoglobin: 10.2 g/dL — ABNORMAL LOW (ref 13.0–17.0)
MCH: 30.4 pg (ref 26.0–34.0)
MCHC: 33.3 g/dL (ref 30.0–36.0)
MCV: 91.1 fL (ref 80.0–100.0)
Platelets: 140 K/uL — ABNORMAL LOW (ref 150–400)
RBC: 3.36 MIL/uL — ABNORMAL LOW (ref 4.22–5.81)
RDW: 13.9 % (ref 11.5–15.5)
WBC: 4.9 K/uL (ref 4.0–10.5)
nRBC: 0 % (ref 0.0–0.2)

## 2024-08-25 LAB — COMPREHENSIVE METABOLIC PANEL WITH GFR
ALT: 6 U/L (ref 0–44)
AST: 16 U/L (ref 15–41)
Albumin: 4.2 g/dL (ref 3.5–5.0)
Alkaline Phosphatase: 68 U/L (ref 38–126)
Anion gap: 13 (ref 5–15)
BUN: 50 mg/dL — ABNORMAL HIGH (ref 8–23)
CO2: 22 mmol/L (ref 22–32)
Calcium: 9.6 mg/dL (ref 8.9–10.3)
Chloride: 107 mmol/L (ref 98–111)
Creatinine, Ser: 5.57 mg/dL — ABNORMAL HIGH (ref 0.61–1.24)
GFR, Estimated: 10 mL/min — ABNORMAL LOW
Glucose, Bld: 100 mg/dL — ABNORMAL HIGH (ref 70–99)
Potassium: 3.7 mmol/L (ref 3.5–5.1)
Sodium: 142 mmol/L (ref 135–145)
Total Bilirubin: 0.6 mg/dL (ref 0.0–1.2)
Total Protein: 7 g/dL (ref 6.5–8.1)

## 2024-08-25 LAB — LIPASE, BLOOD: Lipase: 25 U/L (ref 11–51)

## 2024-08-25 MED ORDER — CLONIDINE HCL 0.1 MG/24HR TD PTWK
0.1000 mg | MEDICATED_PATCH | Freq: Once | TRANSDERMAL | Status: AC
  Administered 2024-08-26: 0.1 mg via TRANSDERMAL
  Filled 2024-08-25: qty 1

## 2024-08-25 MED ORDER — SODIUM CHLORIDE 0.9 % IV BOLUS
1000.0000 mL | Freq: Once | INTRAVENOUS | Status: AC
  Administered 2024-08-26: 1000 mL via INTRAVENOUS

## 2024-08-25 MED ORDER — ONDANSETRON HCL 4 MG/2ML IJ SOLN
4.0000 mg | Freq: Once | INTRAMUSCULAR | Status: AC
  Administered 2024-08-26: 4 mg via INTRAVENOUS
  Filled 2024-08-25: qty 2

## 2024-08-25 MED ORDER — FENTANYL CITRATE (PF) 50 MCG/ML IJ SOSY
50.0000 ug | PREFILLED_SYRINGE | Freq: Once | INTRAMUSCULAR | Status: AC
  Administered 2024-08-26: 50 ug via INTRAVENOUS
  Filled 2024-08-25: qty 1

## 2024-08-25 NOTE — ED Triage Notes (Signed)
 Patient reports that he started having right sided flank pain last night and he started having vomiting episodes today. [facility] being [facility] 5 [facility] failure. He does not take dialysis, he still makes urine.   Denies fever.

## 2024-08-25 NOTE — ED Provider Triage Note (Signed)
 Emergency Medicine Provider Triage Evaluation Note  Cody Silva , a 78 y.o. male  was evaluated in triage.  Pt complains of right lower quadrant pain which began last night, states that he was nauseated along with had 1 episode of nonbilious, nonbloody emesis.  Not having any urinary symptoms but does have some right flank pain.  Review of Systems  Positive: Abdominal pain, nausea, vomiting Negative: fever  Physical Exam  BP (!) 162/87 (BP Location: Left Arm)   Pulse 75   Temp 98.3 F (36.8 C)   Resp (!) 21   Ht 6' 4 (1.93 m)   Wt 68.9 kg   SpO2 99%   BMI 18.49 kg/m  Gen:   Awake, no distress   Resp:  Normal effort  MSK:   Moves extremities without difficulty  Other:  RLQ pain, ttp, right CVA tenderness   Medical Decision Making  Medically screening exam initiated at 4:55 PM.  Appropriate orders placed.  JEVONTE CLANTON was informed that the remainder of the evaluation will be completed by another provider, this initial triage assessment does not replace that evaluation, and the importance of remaining in the ED until their evaluation is complete.     Toryn Mcclinton, PA-C 08/25/24 1655

## 2024-08-25 NOTE — ED Triage Notes (Signed)
 Reports RLQ pain that started last night with vomiting.  Denies urinary problems.  LBM on Saturday. Patient is blind.  Denies fever.  Reports pain is traveling up higher on right side.

## 2024-08-25 NOTE — ED Provider Notes (Signed)
 " [facility] Provider Note   CSN: [id number] Arrival date & time: 08/25/24  1605     Patient presents with: Abdominal Pain and Emesis   Cody Silva is a 78 y.o. male.  {Add pertinent medical, surgical, social history, OB history to YEP:67052} The history is provided by the patient, the spouse and medical records.  Abdominal Pain Associated symptoms: vomiting   Emesis Associated symptoms: abdominal pain   Cody Silva is a 78 y.o. male who presents to the Emergency Department complaining of abdominal pain.  He presents to the emergency department for evaluation of right-sided flank and abdominal pain that started last night.  Pain is described as cramping in nature and constant.  He has associated 3 episodes of emesis.  No fever, diarrhea, dysuria.  He does report decreased urine output over the last 24 hours.  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  He is on a clonidine  patch for hypertension.  He did remove it yesterday while he was in the shower and did not reapply it.  He also has a history of hypertension, aneurysm, prior history of diabetes but is no longer on medications.  He has previously talked with his provider about dialysis and in the past declined but he is now interested in this intervention.        Prior to Admission medications  Medication Sig Start Date End Date Taking? Authorizing Provider  acetaminophen  (TYLENOL ) 500 MG tablet Take 1 tablet (500 mg total) by mouth every 6 (six) hours as needed for mild pain, moderate pain, fever or headache. Patient taking differently: Take 1,000 mg by mouth daily as needed for mild pain (pain score 1-3), moderate pain (pain score 4-6), fever or headache. 11/01/14   Margarie Cutter, MD  albuterol  (PROVENTIL  HFA;VENTOLIN  HFA) 108 (90 Base) MCG/ACT inhaler Inhale 1 puff into the lungs every 6 (six) hours as needed for wheezing or shortness of breath.     [provider]  amLODipine  (NORVASC ) 5 MG tablet Take 1 tablet (5 mg total) by mouth at bedtime. 03/27/24   Shitarev, Dimitry, MD  ammonium lactate (LAC-HYDRIN) 12 % lotion Apply 1 application topically as needed for dry skin.    [provider]  atorvastatin  (LIPITOR) 40 MG tablet Take 40 mg by mouth at bedtime.  01/07/20   [provider]  cloNIDine  (CATAPRES  - DOSED IN MG/24 HR) 0.1 mg/24hr patch Place 0.1 mg onto the skin once a week.    [provider]  clopidogrel  (PLAVIX ) 75 MG tablet Take 1 tablet (75 mg total) by mouth at bedtime. Patient taking differently: Take 75 mg by mouth in the morning. 11/08/14   Margarie Cutter, MD  dorzolamide -timolol  (COSOPT ) 22.3-6.8 MG/ML ophthalmic solution Place 1 drop into both eyes 2 (two) times daily.    [provider]  escitalopram  (LEXAPRO ) 10 MG tablet Take 10 mg by mouth at bedtime.  11/26/19   [provider]  ferrous sulfate  325 (65 FE) MG tablet Take 325 mg by mouth every Monday, Wednesday, and Friday.    [provider]  fluticasone  (FLONASE ) 50 MCG/ACT nasal spray Place 1 spray into both nostrils as needed for allergies.     [provider]  gabapentin  (NEURONTIN ) 300 MG capsule Take 300 mg by mouth at bedtime.    [provider]  hydrALAZINE  (APRESOLINE ) 10 MG tablet TAKE HYDRALAZINE  10 MG BY MOUTH TWICE DAILY AS  NEEDED IF BLOOD PRESSURE IS 160/100 04/22/24   Goodrich, Callie E, PA-C  latanoprost  (XALATAN ) 0.005 % ophthalmic solution Place 1 drop into both eyes at bedtime.     [provider]  terbinafine (LAMISIL) 1 % cream Apply 1 application  topically daily as needed (irritation (fungal)).    [provider]  tolnaftate (TINACTIN) 1 % powder Apply 1 application  topically daily as needed (jock itch).    [provider]    Allergies: Metformin and related, Amlodipine , Felodipine, Lactose intolerance (gi), Pseudoephedrine-acetaminophen ,  Terazosin, Tylenol  with codeine #3 [acetaminophen -codeine], and Tape    Review of Systems  Gastrointestinal:  Positive for abdominal pain and vomiting.  All other systems reviewed and are negative.   Updated Vital Signs BP (!) 213/107   Pulse 86   Temp 98.8 F (37.1 C)   Resp 19   Ht 6' 4 (1.93 m)   Wt 68.9 kg   SpO2 100%   BMI 18.49 kg/m   Physical Exam Vitals and nursing note reviewed.  Constitutional:      Appearance: He is well-developed.  HENT:     Head: Normocephalic and atraumatic.  Cardiovascular:     Rate and Rhythm: Normal rate and regular rhythm.     Heart sounds: No murmur heard. Pulmonary:     Effort: Pulmonary effort is normal. No respiratory distress.     Breath sounds: Normal breath sounds.  Abdominal:     Palpations: Abdomen is soft.     Tenderness: There is no guarding or rebound.     Comments: Mild right-sided abdominal tenderness  Musculoskeletal:        General: No tenderness.  Skin:    General: Skin is warm and dry.  Neurological:     Mental Status: He is alert and oriented to person, place, and time.  Psychiatric:        Behavior: Behavior normal.     (all labs ordered are listed, but only abnormal results are displayed) Labs Reviewed  COMPREHENSIVE METABOLIC PANEL WITH GFR - Abnormal; Notable for the following components:      Result Value   Glucose, Bld 100 (*)    BUN 50 (*)    Creatinine, Ser 5.57 (*)    GFR, Estimated 10 (*)    All other components within normal limits  CBC - Abnormal; Notable for the following components:   RBC 3.36 (*)    Hemoglobin 10.2 (*)    HCT 30.6 (*)    Platelets 140 (*)    All other components within normal limits  URINALYSIS, ROUTINE W REFLEX MICROSCOPIC - Abnormal; Notable for the following components:   APPearance HAZY (*)    Glucose, UA 50 (*)    Hgb urine dipstick SMALL (*)    Protein, ur >=300 (*)    Leukocytes,Ua SMALL (*)    All other components within normal limits  LIPASE, BLOOD     EKG: None  Radiology: No results found.  {Document cardiac monitor, telemetry assessment procedure when appropriate:32947} Procedures   Medications Ordered in the ED - No data to display    {Click here for ABCD2, HEART and other calculators REFRESH Note before signing:1}                              Medical Decision Making Amount and/or Complexity of Data Reviewed Labs: ordered. Radiology: ordered.  Risk Prescription drug management.   ***  {Document critical care time  when appropriate  Document review of labs and clinical decision tools ie CHADS2VASC2, etc  Document your independent review of radiology images and any outside records  Document your discussion with family members, caretakers and with consultants  Document social determinants of health affecting pt's care  Document your decision making why or why not admission, treatments were needed:32947:::1}   Final diagnoses:  None    ED Discharge Orders     None        "

## 2024-08-26 ENCOUNTER — Encounter (HOSPITAL_COMMUNITY): Payer: Self-pay | Admitting: Internal Medicine

## 2024-08-26 ENCOUNTER — Observation Stay (HOSPITAL_COMMUNITY): Admitting: Anesthesiology

## 2024-08-26 ENCOUNTER — Observation Stay (HOSPITAL_COMMUNITY)

## 2024-08-26 ENCOUNTER — Encounter (HOSPITAL_COMMUNITY)
Admission: EM | Disposition: A | Discharge status: Skilled Nursing Facility | Payer: Self-pay | Source: Home / Self Care | Attending: Family Medicine

## 2024-08-26 ENCOUNTER — Emergency Department (HOSPITAL_COMMUNITY)

## 2024-08-26 DIAGNOSIS — I69351 Hemiplegia and hemiparesis following cerebral infarction affecting right dominant side: Secondary | ICD-10-CM | POA: Diagnosis not present

## 2024-08-26 DIAGNOSIS — I120 Hypertensive chronic kidney disease with stage 5 chronic kidney disease or end stage renal disease: Secondary | ICD-10-CM | POA: Diagnosis not present

## 2024-08-26 DIAGNOSIS — E1122 Type 2 diabetes mellitus with diabetic chronic kidney disease: Secondary | ICD-10-CM | POA: Diagnosis present

## 2024-08-26 DIAGNOSIS — N209 Urinary calculus, unspecified: Secondary | ICD-10-CM | POA: Diagnosis present

## 2024-08-26 DIAGNOSIS — Z7902 Long term (current) use of antithrombotics/antiplatelets: Secondary | ICD-10-CM | POA: Diagnosis not present

## 2024-08-26 DIAGNOSIS — N201 Calculus of ureter: Secondary | ICD-10-CM

## 2024-08-26 DIAGNOSIS — M4856XA Collapsed vertebra, not elsewhere classified, lumbar region, initial encounter for fracture: Secondary | ICD-10-CM | POA: Diagnosis present

## 2024-08-26 DIAGNOSIS — F05 Delirium due to known physiological condition: Secondary | ICD-10-CM | POA: Diagnosis not present

## 2024-08-26 DIAGNOSIS — Z8249 Family history of ischemic heart disease and other diseases of the circulatory system: Secondary | ICD-10-CM | POA: Diagnosis not present

## 2024-08-26 DIAGNOSIS — H409 Unspecified glaucoma: Secondary | ICD-10-CM | POA: Diagnosis present

## 2024-08-26 DIAGNOSIS — I129 Hypertensive chronic kidney disease with stage 1 through stage 4 chronic kidney disease, or unspecified chronic kidney disease: Secondary | ICD-10-CM | POA: Diagnosis not present

## 2024-08-26 DIAGNOSIS — N132 Hydronephrosis with renal and ureteral calculous obstruction: Secondary | ICD-10-CM | POA: Diagnosis present

## 2024-08-26 DIAGNOSIS — D62 Acute posthemorrhagic anemia: Secondary | ICD-10-CM | POA: Diagnosis not present

## 2024-08-26 DIAGNOSIS — I160 Hypertensive urgency: Secondary | ICD-10-CM

## 2024-08-26 DIAGNOSIS — N184 Chronic kidney disease, stage 4 (severe): Secondary | ICD-10-CM | POA: Diagnosis present

## 2024-08-26 DIAGNOSIS — D696 Thrombocytopenia, unspecified: Secondary | ICD-10-CM | POA: Diagnosis present

## 2024-08-26 DIAGNOSIS — N400 Enlarged prostate without lower urinary tract symptoms: Secondary | ICD-10-CM | POA: Diagnosis not present

## 2024-08-26 DIAGNOSIS — F32A Depression, unspecified: Secondary | ICD-10-CM | POA: Diagnosis present

## 2024-08-26 DIAGNOSIS — N179 Acute kidney failure, unspecified: Secondary | ICD-10-CM | POA: Diagnosis present

## 2024-08-26 DIAGNOSIS — C412 Malignant neoplasm of vertebral column: Secondary | ICD-10-CM | POA: Diagnosis present

## 2024-08-26 DIAGNOSIS — R410 Disorientation, unspecified: Secondary | ICD-10-CM | POA: Diagnosis not present

## 2024-08-26 DIAGNOSIS — N139 Obstructive and reflux uropathy, unspecified: Secondary | ICD-10-CM | POA: Diagnosis not present

## 2024-08-26 DIAGNOSIS — I152 Hypertension secondary to endocrine disorders: Secondary | ICD-10-CM | POA: Diagnosis present

## 2024-08-26 DIAGNOSIS — R310 Gross hematuria: Secondary | ICD-10-CM | POA: Diagnosis not present

## 2024-08-26 DIAGNOSIS — N189 Chronic kidney disease, unspecified: Secondary | ICD-10-CM | POA: Diagnosis not present

## 2024-08-26 DIAGNOSIS — Z833 Family history of diabetes mellitus: Secondary | ICD-10-CM | POA: Diagnosis not present

## 2024-08-26 DIAGNOSIS — E785 Hyperlipidemia, unspecified: Secondary | ICD-10-CM | POA: Diagnosis present

## 2024-08-26 DIAGNOSIS — H548 Legal blindness, as defined in USA: Secondary | ICD-10-CM | POA: Diagnosis present

## 2024-08-26 DIAGNOSIS — N200 Calculus of kidney: Secondary | ICD-10-CM | POA: Diagnosis not present

## 2024-08-26 DIAGNOSIS — N211 Calculus in urethra: Secondary | ICD-10-CM | POA: Diagnosis not present

## 2024-08-26 DIAGNOSIS — E1140 Type 2 diabetes mellitus with diabetic neuropathy, unspecified: Secondary | ICD-10-CM | POA: Diagnosis present

## 2024-08-26 DIAGNOSIS — Y838 Other surgical procedures as the cause of abnormal reaction of the patient, or of later complication, without mention of misadventure at the time of the procedure: Secondary | ICD-10-CM | POA: Diagnosis not present

## 2024-08-26 DIAGNOSIS — N185 Chronic kidney disease, stage 5: Secondary | ICD-10-CM | POA: Diagnosis not present

## 2024-08-26 DIAGNOSIS — G8929 Other chronic pain: Secondary | ICD-10-CM | POA: Diagnosis present

## 2024-08-26 DIAGNOSIS — R338 Other retention of urine: Secondary | ICD-10-CM | POA: Diagnosis present

## 2024-08-26 DIAGNOSIS — N99820 Postprocedural hemorrhage and hematoma of a genitourinary system organ or structure following a genitourinary system procedure: Secondary | ICD-10-CM | POA: Diagnosis not present

## 2024-08-26 DIAGNOSIS — N138 Other obstructive and reflux uropathy: Secondary | ICD-10-CM | POA: Diagnosis present

## 2024-08-26 HISTORY — PX: CYSTOSCOPY/URETEROSCOPY/HOLMIUM LASER/STENT PLACEMENT: SHX6546

## 2024-08-26 LAB — LACTIC ACID, PLASMA
Lactic Acid, Venous: 0.8 mmol/L (ref 0.5–1.9)
Lactic Acid, Venous: 1 mmol/L (ref 0.5–1.9)

## 2024-08-26 LAB — CBC
HCT: 28.3 % — ABNORMAL LOW (ref 39.0–52.0)
Hemoglobin: 9.3 g/dL — ABNORMAL LOW (ref 13.0–17.0)
MCH: 30.8 pg (ref 26.0–34.0)
MCHC: 32.9 g/dL (ref 30.0–36.0)
MCV: 93.7 fL (ref 80.0–100.0)
Platelets: 123 K/uL — ABNORMAL LOW (ref 150–400)
RBC: 3.02 MIL/uL — ABNORMAL LOW (ref 4.22–5.81)
RDW: 13.7 % (ref 11.5–15.5)
WBC: 4.9 K/uL (ref 4.0–10.5)
nRBC: 0 % (ref 0.0–0.2)

## 2024-08-26 LAB — GLUCOSE, CAPILLARY
Glucose-Capillary: 82 mg/dL (ref 70–99)
Glucose-Capillary: 76 mg/dL (ref 70–99)
Glucose-Capillary: 127 mg/dL — ABNORMAL HIGH (ref 70–99)

## 2024-08-26 LAB — BASIC METABOLIC PANEL WITH GFR
Anion gap: 11 (ref 5–15)
BUN: 50 mg/dL — ABNORMAL HIGH (ref 8–23)
CO2: 21 mmol/L — ABNORMAL LOW (ref 22–32)
Calcium: 9.1 mg/dL (ref 8.9–10.3)
Chloride: 111 mmol/L (ref 98–111)
Creatinine, Ser: 5.55 mg/dL — ABNORMAL HIGH (ref 0.61–1.24)
GFR, Estimated: 10 mL/min — ABNORMAL LOW
Glucose, Bld: 88 mg/dL (ref 70–99)
Potassium: 3.6 mmol/L (ref 3.5–5.1)
Sodium: 143 mmol/L (ref 135–145)

## 2024-08-26 SURGERY — CYSTOSCOPY/URETEROSCOPY/HOLMIUM LASER/STENT PLACEMENT
Anesthesia: General | Site: Penis | Laterality: Right

## 2024-08-26 MED ORDER — PROPOFOL 1000 MG/100ML IV EMUL
INTRAVENOUS | Status: AC
  Filled 2024-08-26: qty 100

## 2024-08-26 MED ORDER — OXYCODONE HCL 5 MG PO TABS
5.0000 mg | ORAL_TABLET | Freq: Once | ORAL | Status: AC | PRN
  Administered 2024-08-26: 5 mg via ORAL

## 2024-08-26 MED ORDER — SODIUM CHLORIDE 0.9 % IR SOLN
Status: DC | PRN
  Administered 2024-08-26 (×6): 3000 mL via INTRAVESICAL

## 2024-08-26 MED ORDER — NALOXONE HCL 0.4 MG/ML IJ SOLN: 0.4000 mg | INTRAMUSCULAR | Status: DC | PRN

## 2024-08-26 MED ORDER — ACETAMINOPHEN 650 MG RE SUPP: 650.0000 mg | Freq: Four times a day (QID) | RECTAL | Status: DC | PRN

## 2024-08-26 MED ORDER — PROPOFOL 500 MG/50ML IV EMUL
INTRAVENOUS | Status: DC | PRN
  Administered 2024-08-26: 140 ug/kg/min via INTRAVENOUS

## 2024-08-26 MED ORDER — ONDANSETRON HCL 4 MG/2ML IJ SOLN
INTRAMUSCULAR | Status: DC | PRN
  Administered 2024-08-26: 4 mg via INTRAVENOUS

## 2024-08-26 MED ORDER — GABAPENTIN 100 MG PO CAPS
300.0000 mg | ORAL_CAPSULE | Freq: Every day | ORAL | Status: DC
  Administered 2024-08-26 – 2024-09-09 (×14): 300 mg via ORAL
  Filled 2024-08-26 (×4): qty 1
  Filled 2024-08-26 (×2): qty 3
  Filled 2024-08-26: qty 1

## 2024-08-26 MED ORDER — SODIUM CHLORIDE 0.9 % IR SOLN
3000.0000 mL | Status: DC
  Administered 2024-08-26 – 2024-08-28 (×21): 3000 mL

## 2024-08-26 MED ORDER — CHLORHEXIDINE GLUCONATE CLOTH 2 % EX PADS
6.0000 | MEDICATED_PAD | Freq: Every day | CUTANEOUS | Status: DC
  Administered 2024-08-26 – 2024-09-06 (×11): 6 via TOPICAL

## 2024-08-26 MED ORDER — ONDANSETRON HCL 4 MG/2ML IJ SOLN
INTRAMUSCULAR | Status: AC
  Filled 2024-08-26: qty 2

## 2024-08-26 MED ORDER — DORZOLAMIDE HCL-TIMOLOL MAL 2-0.5 % OP SOLN
1.0000 [drp] | Freq: Two times a day (BID) | OPHTHALMIC | Status: DC
  Administered 2024-08-26 – 2024-09-10 (×29): 1 [drp] via OPHTHALMIC
  Filled 2024-08-26: qty 10

## 2024-08-26 MED ORDER — HYDRALAZINE HCL 20 MG/ML IJ SOLN
5.0000 mg | INTRAMUSCULAR | Status: DC | PRN
  Administered 2024-08-26 (×2): 5 mg via INTRAVENOUS
  Filled 2024-08-26 (×2): qty 1

## 2024-08-26 MED ORDER — AMLODIPINE BESYLATE 5 MG PO TABS
5.0000 mg | ORAL_TABLET | Freq: Every day | ORAL | Status: DC
  Administered 2024-08-26: 5 mg via ORAL
  Filled 2024-08-26: qty 1

## 2024-08-26 MED ORDER — FENTANYL CITRATE (PF) 100 MCG/2ML IJ SOLN
INTRAMUSCULAR | Status: AC
  Filled 2024-08-26: qty 2

## 2024-08-26 MED ORDER — HYDROMORPHONE HCL 1 MG/ML IJ SOLN
0.5000 mg | INTRAMUSCULAR | Status: DC | PRN
  Administered 2024-08-27: 0.5 mg via INTRAVENOUS
  Filled 2024-08-26 (×2): qty 0.5

## 2024-08-26 MED ORDER — OXYCODONE HCL 5 MG PO TABS
ORAL_TABLET | ORAL | Status: AC
  Filled 2024-08-26: qty 1

## 2024-08-26 MED ORDER — SODIUM CHLORIDE 0.9 % IV SOLN
1.0000 g | INTRAVENOUS | Status: DC
  Administered 2024-08-26 – 2024-09-01 (×6): 1 g via INTRAVENOUS
  Filled 2024-08-26 (×7): qty 10

## 2024-08-26 MED ORDER — CHLORHEXIDINE GLUCONATE 0.12 % MT SOLN
15.0000 mL | Freq: Once | OROMUCOSAL | Status: AC
  Administered 2024-08-26: 15 mL via OROMUCOSAL

## 2024-08-26 MED ORDER — SODIUM CHLORIDE 0.9 % IV SOLN: INTRAVENOUS | Status: DC

## 2024-08-26 MED ORDER — DEXTROSE 50 % IV SOLN
INTRAVENOUS | Status: AC
  Filled 2024-08-26: qty 50

## 2024-08-26 MED ORDER — ACETAMINOPHEN 500 MG PO TABS
1000.0000 mg | ORAL_TABLET | Freq: Once | ORAL | Status: AC
  Administered 2024-08-26: 1000 mg via ORAL
  Filled 2024-08-26: qty 2

## 2024-08-26 MED ORDER — LIDOCAINE HCL (PF) 2 % IJ SOLN
INTRAMUSCULAR | Status: AC
  Filled 2024-08-26: qty 5

## 2024-08-26 MED ORDER — ESCITALOPRAM OXALATE 20 MG PO TABS
10.0000 mg | ORAL_TABLET | Freq: Every day | ORAL | Status: DC
  Administered 2024-08-26 – 2024-09-09 (×14): 10 mg via ORAL
  Filled 2024-08-26 (×7): qty 1

## 2024-08-26 MED ORDER — IOHEXOL 300 MG/ML  SOLN
INTRAMUSCULAR | Status: DC | PRN
  Administered 2024-08-26: 10 mL via URETHRAL

## 2024-08-26 MED ORDER — ACETAMINOPHEN 325 MG PO TABS
650.0000 mg | ORAL_TABLET | Freq: Four times a day (QID) | ORAL | Status: DC | PRN
  Administered 2024-08-26 – 2024-08-30 (×5): 650 mg via ORAL
  Filled 2024-08-26 (×6): qty 2

## 2024-08-26 MED ORDER — DEXTROSE 50 % IV SOLN
25.0000 mL | Freq: Once | INTRAVENOUS | Status: AC
  Administered 2024-08-26: 25 mL via INTRAVENOUS

## 2024-08-26 MED ORDER — ONDANSETRON HCL 4 MG/2ML IJ SOLN: 4.0000 mg | Freq: Four times a day (QID) | INTRAMUSCULAR | Status: DC | PRN

## 2024-08-26 MED ORDER — LIDOCAINE HCL (CARDIAC) PF 100 MG/5ML IV SOSY
PREFILLED_SYRINGE | INTRAVENOUS | Status: DC | PRN
  Administered 2024-08-26: 60 mg via INTRAVENOUS

## 2024-08-26 MED ORDER — OXYCODONE HCL 5 MG/5ML PO SOLN: 5.0000 mg | Freq: Once | ORAL | Status: AC | PRN

## 2024-08-26 MED ORDER — PROPOFOL 10 MG/ML IV BOLUS
INTRAVENOUS | Status: DC | PRN
  Administered 2024-08-26: 160 mg via INTRAVENOUS
  Administered 2024-08-26: 30 mg via INTRAVENOUS

## 2024-08-26 MED ORDER — ONDANSETRON HCL 4 MG/2ML IJ SOLN: 4.0000 mg | Freq: Once | INTRAMUSCULAR | Status: DC | PRN

## 2024-08-26 MED ORDER — FENTANYL CITRATE (PF) 50 MCG/ML IJ SOSY
25.0000 ug | PREFILLED_SYRINGE | INTRAMUSCULAR | Status: DC | PRN
  Administered 2024-08-26 (×2): 25 ug via INTRAVENOUS

## 2024-08-26 MED ORDER — ATORVASTATIN CALCIUM 20 MG PO TABS
40.0000 mg | ORAL_TABLET | Freq: Every day | ORAL | Status: DC
  Administered 2024-08-26 – 2024-09-09 (×14): 40 mg via ORAL
  Filled 2024-08-26: qty 1
  Filled 2024-08-26 (×2): qty 2
  Filled 2024-08-26 (×4): qty 1

## 2024-08-26 MED ORDER — FENTANYL CITRATE (PF) 50 MCG/ML IJ SOSY
PREFILLED_SYRINGE | INTRAMUSCULAR | Status: AC
  Filled 2024-08-26: qty 1

## 2024-08-26 MED ORDER — SODIUM CHLORIDE 0.9 % IV SOLN: INTRAVENOUS | Status: DC | PRN

## 2024-08-26 MED ORDER — LATANOPROST 0.005 % OP SOLN
1.0000 [drp] | Freq: Every day | OPHTHALMIC | Status: DC
  Administered 2024-08-26 – 2024-09-09 (×15): 1 [drp] via OPHTHALMIC
  Filled 2024-08-26: qty 2.5

## 2024-08-26 SURGICAL SUPPLY — 28 items
BAG URO CATCHER STRL LF (MISCELLANEOUS) ×1 IMPLANT
BASKET STONE NCOMPASS (UROLOGICAL SUPPLIES) IMPLANT
BASKET ZERO TIP NITINOL 2.4FR (BASKET) IMPLANT
CATH FOL 3WAY LX 20X30 (CATHETERS) IMPLANT
CATH URETERAL DUAL LUMEN 10F (MISCELLANEOUS) IMPLANT
CATH URETL OPEN 5X70 (CATHETERS) IMPLANT
CATH UROLOGY TORQUE 65 (CATHETERS) IMPLANT
CLOTH BEACON ORANGE TIMEOUT ST (SAFETY) ×1 IMPLANT
EXTRACTOR STONE 1.7FRX115CM (UROLOGICAL SUPPLIES) IMPLANT
FIBER LASER FLEXIVA PULSE EA (MISCELLANEOUS) IMPLANT
FIBER LASER MOSES 200 DFL (Laser) IMPLANT
FIBER LASER MOSES 365 DFL (Laser) IMPLANT
GLOVE BIO SURGEON STRL SZ8 (GLOVE) ×1 IMPLANT
GOWN STRL REUS W/ TWL XL LVL3 (GOWN DISPOSABLE) ×1 IMPLANT
GUIDEWIRE ANG ZIPWIRE 038X150 (WIRE) IMPLANT
GUIDEWIRE STR DUAL SENSOR (WIRE) ×1 IMPLANT
GUIDEWIRE STRT TIP .038X150X3 (WIRE) IMPLANT
KIT TURNOVER KIT A (KITS) ×1 IMPLANT
LOOP CUT BIPOLAR 24F LRG (ELECTROSURGICAL) IMPLANT
MANIFOLD NEPTUNE II (INSTRUMENTS) ×1 IMPLANT
PACK CYSTO (CUSTOM PROCEDURE TRAY) ×1 IMPLANT
SHEATH NAVIGATOR HD 11/13X28 (SHEATH) IMPLANT
SHEATH NAVIGATOR HD 11/13X36 (SHEATH) ×1 IMPLANT
STENT PERCUFLEX 4.8FRX26 (STENTS) IMPLANT
STENT URET 6FRX26 CONTOUR (STENTS) IMPLANT
SYRINGE TOOMEY IRRIG 70ML (MISCELLANEOUS) IMPLANT
TUBING CONNECTING 10 (TUBING) ×1 IMPLANT
TUBING UROLOGY SET (TUBING) ×1 IMPLANT

## 2024-08-26 NOTE — Anesthesia Postprocedure Evaluation (Signed)
"   Anesthesia Post Note  Patient: VIVAN AGOSTINO  Procedure(s) Performed: CYSTOSCOPY/URETEROSCOPY/HOLMIUM LASER/STENT PLACEMENT (Right: Penis)     Patient location during evaluation: PACU Anesthesia Type: General Level of consciousness: awake and alert Pain management: pain level controlled Vital Signs Assessment: post-procedure vital signs reviewed and stable Respiratory status: spontaneous breathing, nonlabored ventilation and respiratory function stable Cardiovascular status: stable and blood pressure returned to baseline Anesthetic complications: no   No notable events documented.  Last Vitals:  Vitals:   08/26/24 1315 08/26/24 1330  BP: 138/77 (!) 147/79  Pulse: (!) 53 60  Resp: 11 15  Temp: (!) 36.3 C   SpO2: 95% 99%    Last Pain:  Vitals:   08/26/24 1330  TempSrc:   PainSc: Asleep                 Debby FORBES Like      "

## 2024-08-26 NOTE — ED Notes (Signed)
 Patient transported to CT

## 2024-08-26 NOTE — Anesthesia Preprocedure Evaluation (Addendum)
"                                    Anesthesia Evaluation  Patient identified by MRN, date of birth, ID band Patient awake    Reviewed: Allergy & Precautions, NPO status , Patient's Chart, lab work & pertinent test results  History of Anesthesia Complications Negative for: history of anesthetic complications  Airway Mallampati: II  TM Distance: >3 FB Neck ROM: Full    Dental  (+) Dental Advisory Given, Partial Upper, Partial Lower   Pulmonary sleep apnea    Pulmonary exam normal        Cardiovascular hypertension, Pt. on medications Normal cardiovascular exam   '25 TTE - EF 60 to 65%. There is mild concentric left ventricular hypertrophy. Mild mitral valve regurgitation. Aortic valve regurgitation is mild.     Neuro/Psych  Headaches, Seizures -,  PSYCHIATRIC DISORDERS Anxiety Depression     Memory loss Blind   Neuromuscular disease CVA, Residual Symptoms    GI/Hepatic negative GI ROS, Neg liver ROS,,,  Endo/Other  diabetes, Type 2    Renal/GU CRFRenal disease     Musculoskeletal  (+) Arthritis ,    Abdominal   Peds  Hematology  (+) Blood dyscrasia, anemia  Plt 123k On plavix     Anesthesia Other Findings   Reproductive/Obstetrics                              Anesthesia Physical Anesthesia Plan  ASA: 3  Anesthesia Plan: General   Post-op Pain Management: Tylenol  PO (pre-op)*   Induction: Intravenous  PONV Risk Score and Plan: 2 and Treatment may vary due to age or medical condition, Ondansetron  and TIVA  Airway Management Planned: LMA  Additional Equipment: None  Intra-op Plan:   Post-operative Plan: Extubation in OR  Informed Consent: I have reviewed the patients History and Physical, chart, labs and discussed the procedure including the risks, benefits and alternatives for the proposed anesthesia with the patient or authorized representative who has indicated his/her understanding and acceptance.      Dental advisory given  Plan Discussed with: CRNA and Anesthesiologist  Anesthesia Plan Comments:          Anesthesia Quick Evaluation  "

## 2024-08-26 NOTE — Consult Note (Signed)
 I have been asked to see the patient by Dr. Almarie Reese, for evaluation and management of right distal ureteral stone.  History of present illness: 78 year old male who presented the ER after having 1 day of nausea and vomiting he [facility] he go to the emergency room.  CT done in the ER demonstrated distal right ureteral stone 3 cm in size.  Patient's history significant for CKD stage V he has refused dialysis in the past has now changed his mind is not interested in dialysis.  Has a history of type 2 diabetes, hypertension ascending thoracic aneurysm depression gout OSA orthostatic hypotension, recurrent syncope, pancytopenia and blindness.  Patient states that his pains been going on for 1 day has been in the right lower quadrant which resulted in significant pain as well as nausea vomiting.  Patient also has significant elevated blood pressure  Patient's white count is within normal limits his creatinine is elevated to 5.57 from a baseline of 4.2-4.8 his electrolytes are within normal limits.  UA is nonsignificant for infection.   Review of systems: A 12 point comprehensive review of systems was obtained and is negative unless otherwise stated in the history of present illness.  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   Achilles tendon contracture, bilateral 02/07/2018   Right foot ulcer, limited to breakdown of skin (HCC) 02/07/2018   Memory loss 02/06/2018   Gait abnormality 02/06/2018   Diabetic neuropathy (HCC)  02/04/2018   Type II diabetes mellitus with foot ulcer (HCC) 12/05/2017   Diabetic foot infection (HCC)    Fever 12/04/2017   Fever and chills 12/04/2017   Hx of bacteremia    Wound eschar of foot    Acute sepsis (HCC) 11/12/2017   Scrotal pain    Sepsis (HCC) 11/11/2017   Atrophy of muscle of right hand 04/04/2017   Protein-calorie malnutrition, severe 01/19/2017   Altered mental status 01/09/2017   Facial droop 01/09/2017   Cerebral thrombosis with cerebral infarction 01/09/2017   Seizures (HCC)    Other hyperlipidemia    OSA on CPAP 09/12/2016   Weight loss 09/12/2016   Malnutrition of moderate degree 09/11/2016   Syncope 09/10/2016   H/O agent Orange exposure 12/24/2015   Type 2 diabetes with nephropathy (HCC)    Near syncope 12/22/2015   History of TIAs 09/14/2015   CKD stage 3 due to type 2 diabetes mellitus (HCC) 09/14/2015   Acute lower GI bleeding 10/30/2014   History of colonic polyps 10/30/2014   Depression 10/30/2014   Symptomatic anemia 10/30/2014   Acute renal failure superimposed on stage 3b chronic kidney disease (HCC) 10/30/2014   Ataxic gait 09/21/2014   History of CVA (cerebrovascular accident) 02/22/2013   Abnormal brain scan 02/21/2013   Dizziness 02/21/2013   Weakness 02/21/2013   Diabetes mellitus (HCC) 02/21/2013   Hypertension associated with diabetes (HCC) 02/21/2013    Medications Ordered Prior to Encounter[1]  Past Medical History:  Diagnosis Date   Anemia    Aneurysm of aorta    [facility] [place]  Arthritis    right arm, right ankle, right side (10/30/2014)   Chronic kidney disease    Colon polyps    Depression    Diabetic neuropathy (HCC)    Diabetic retinopathy (HCC)    H/O agent Orange exposure    Headache    @ least 3 times/wk (10/30/2014)   History of blood transfusion 10/30/2014   hematochezia   History of gout    Hypertension    Neuromuscular disorder (HCC)    Diabetic neuropathy   OSA on  CPAP    does not wear   PTSD (post-traumatic stress disorder)    service related   Stroke [facility]) 2014   left extremity deficits; facial left   Type II diabetes mellitus (HCC)    hx of no meds    Past Surgical History:  Procedure Laterality Date   BONE GRAFT HIP ILIAC CREST Left ~ 1976   CATARACT EXTRACTION W/ INTRAOCULAR LENS  IMPLANT, BILATERAL Bilateral 2014-2015   EP IMPLANTABLE DEVICE N/A 12/24/2015   Procedure: Loop Recorder Insertion;  Surgeon: Lynwood Rakers, MD;  Location: [facility] INVASIVE CV LAB;  Service: Cardiovascular;  Laterality: N/A;   EYE SURGERY     bleed behind eye   PARATHYROIDECTOMY Left 01/03/2022   Procedure: LEFT INFERIOR PARATHYROIDECTOMY;  Surgeon: Eletha Boas, MD;  Location: [facility] ORS;  Service: General;  Laterality: Left;   TEE WITHOUT CARDIOVERSION N/A 12/07/2017   Procedure: TRANSESOPHAGEAL ECHOCARDIOGRAM (TEE);  Surgeon: Okey Vina GAILS, MD;  Location: [facility] ENDOSCOPY;  Service: Cardiovascular;  Laterality: N/A;   TUMOR REMOVAL Right ~ 1976   arm; had to take bone left hip to add to the repair   elbow    Social History[2]  Family History  Problem Relation Age of Onset   Diabetes Mother    Breast cancer Mother    Heart attack Mother        CABG - Age 93   Kidney disease Mother    Stroke Brother    Lung disease Father    Neurofibromatosis Maternal Uncle    Throat cancer Brother    Hypertension Brother     PE: Vitals:   08/26/24 0816 08/26/24 0830 08/26/24 0845 08/26/24 0943  BP: (!) 204/66 (!) 193/89 (!) 176/97 (!) 189/94  Pulse: (!) 40 66 (!) 56 66  Resp: 16 11 14 18   Temp:    97.8 F (36.6 C)  TempSrc:    Oral  SpO2: 100% 100% 100% 99%  Weight:      Height:       Patient appears to be in no acute distress  Respiratory: Normal breathing room air Cardiovascular: Regular rate and rhythm per monitor Abdomen: GU: Catheter in place draining clear yellow urine  Recent Labs    08/25/24 1648 08/26/24 0547  WBC 4.9 4.9  HGB 10.2* 9.3*  HCT  30.6* 28.3*   Recent Labs    08/25/24 1648 08/26/24 0547  NA 142 143  K 3.7 3.6  CL 107 111  CO2 22 21*  GLUCOSE 100* 88  BUN 50* 50*  CREATININE 5.57* 5.55*  CALCIUM  9.6 9.1   No results for input(s): LABPT, INR in the last 72 hours. No results for input(s): LABURIN in the last 72 hours. Results for orders placed or performed during the hospital encounter of 03/25/24  Resp panel by RT-PCR (RSV, Flu A&B, Covid) Anterior Nasal Swab     Status: None   Collection Time: 03/25/24 12:00 PM   Specimen: Anterior Nasal Swab  Result  Value Ref Range Status   SARS Coronavirus 2 by RT PCR NEGATIVE NEGATIVE Final   Influenza A by PCR NEGATIVE NEGATIVE Final   Influenza B by PCR NEGATIVE NEGATIVE Final    Comment: (NOTE) The Xpert Xpress SARS-CoV-2/FLU/RSV plus assay is intended as an aid in the diagnosis of influenza from Nasopharyngeal swab specimens and should not be used as a sole basis for treatment. Nasal washings and aspirates are unacceptable for Xpert Xpress SARS-CoV-2/FLU/RSV testing.  Fact Sheet for Patients: bloggercourse.com  Fact Sheet for Healthcare Providers: seriousbroker.it  This test is not yet approved or cleared by the [place]  FDA and has been authorized for detection and/or diagnosis of SARS-CoV-2 by FDA under an Emergency Use Authorization (EUA). This EUA will remain in effect (meaning this test can be used) for the duration of the COVID-19 declaration under Section 564(b)(1) of the Act, 21 U.S.C. section 360bbb-3(b)(1), unless the authorization is terminated or revoked.     Resp Syncytial Virus by PCR NEGATIVE NEGATIVE Final    Comment: (NOTE) Fact Sheet for Patients: bloggercourse.com  Fact Sheet for Healthcare Providers: seriousbroker.it  This test is not yet approved or cleared by the [place]  FDA and has been authorized for detection  and/or diagnosis of SARS-CoV-2 by FDA under an Emergency Use Authorization (EUA). This EUA will remain in effect (meaning this test can be used) for the duration of the COVID-19 declaration under Section 564(b)(1) of the Act, 21 U.S.C. section 360bbb-3(b)(1), unless the authorization is terminated or revoked.  Performed at [facility], [address], [place], [place] [address]     Imaging: CT 12/22 IMPRESSION: 1. Right renal and perirenal edema, peripelvic and periureteral edema, and moderate hydroureteronephrosis. A 3 mm calcification at the distal right ureter/UVJ may represent a ureteral stone or a phlebolith. Findings are consistent with obstructive urolithiasis if this is a stone; alternatively, findings could be due to a recently passed stone or an ascending UTI. 2. Mild bladder thickening, likely due to chronic bladder outlet obstruction related to the enlarged prostate with disproportionate median lobe hypertrophy. 3. Mild anterior wedging of the L2 vertebral body with increased patchy sclerosis, possibly representing a healing subacute compression fracture. Underlying metastasis is not excluded. Follow-up as indicated, and bone scintigraphy or MRI without and with contrast may be helpful. 4. Mild edema in the body wall and mesentery with minimal societies.  Imp: 78 year old male who [facility] here at the request of his nephrologist with a right distal ureteral stone in the setting of CKD stage V with AKI.  No signs of infection, plan for right ureteroscopy with laser lithotripsy today.  We discussed risk benefits alternatives to ureteroscopy.  This included bleeding infection and damage to surrounding structures surrounding structures including ureter as well as urethra.  We discussed the need for stent postoperatively as well as the potential symptoms of stent placement.  We discussed possible inability to complete procedure due to caliber of your ureter or  inability to pass stone possibly requiring long-term stent versus nephrostomy tube.  We discussed need for possible second surgery.  Patient voiced their understanding and consent was obtained.     Recommendations: - Plan for right ureteroscopy with laser lithotripsy - Received ceftriaxone  this morning no need for preoperative antibiotics   Thank you for involving me in this patient's care, I will continue to follow along.Please page with any further questions or concerns. Steffan JAYSON Pea      [1]  No current  facility-administered medications on file prior to encounter.   Current Outpatient Medications on File Prior to Encounter  Medication Sig Dispense Refill   acetaminophen  (TYLENOL ) 500 MG tablet Take 1 tablet (500 mg total) by mouth every 6 (six) hours as needed for mild pain, moderate pain, fever or headache. (Patient taking differently: Take 1,000 mg by mouth daily as needed for mild pain (pain score 1-3), moderate pain (pain score 4-6), fever or headache.) 30 tablet 0   amLODipine  (NORVASC ) 5 MG tablet Take 1 tablet (5 mg total) by mouth at bedtime. 30 tablet 0   dorzolamide -timolol  (COSOPT ) 22.3-6.8 MG/ML ophthalmic solution Place 1 drop into both eyes 2 (two) times daily.     albuterol  (PROVENTIL  HFA;VENTOLIN  HFA) 108 (90 Base) MCG/ACT inhaler Inhale 1 puff into the lungs every 6 (six) hours as needed for wheezing or shortness of breath.     ammonium lactate (LAC-HYDRIN) 12 % lotion Apply 1 application topically as needed for dry skin.     atorvastatin  (LIPITOR) 40 MG tablet Take 40 mg by mouth at bedtime.      cloNIDine  (CATAPRES  - DOSED IN MG/24 HR) 0.1 mg/24hr patch Place 0.1 mg onto the skin once a week.     clopidogrel  (PLAVIX ) 75 MG tablet Take 1 tablet (75 mg total) by mouth at bedtime. (Patient taking differently: Take 75 mg by mouth in the morning.)     escitalopram  (LEXAPRO ) 10 MG tablet Take 10 mg by mouth at bedtime.      ferrous sulfate  325 (65 FE) MG tablet Take  325 mg by mouth every Monday, Wednesday, and Friday.     fluticasone  (FLONASE ) 50 MCG/ACT nasal spray Place 1 spray into both nostrils as needed for allergies.      gabapentin  (NEURONTIN ) 300 MG capsule Take 300 mg by mouth at bedtime.     hydrALAZINE  (APRESOLINE ) 10 MG tablet TAKE HYDRALAZINE  10 MG BY MOUTH TWICE DAILY AS NEEDED IF BLOOD PRESSURE IS 160/100 30 tablet 2   latanoprost  (XALATAN ) 0.005 % ophthalmic solution Place 1 drop into both eyes at bedtime.      terbinafine (LAMISIL) 1 % cream Apply 1 application  topically daily as needed (irritation (fungal)).     tolnaftate (TINACTIN) 1 % powder Apply 1 application  topically daily as needed (jock itch).    [2]  Social History Tobacco Use   Smoking status: Never   Smokeless tobacco: Never  Vaping Use   Vaping status: Never Used  Substance Use Topics   Alcohol  use: Not Currently    Alcohol /week: 1.0 standard drink of alcohol     Types: 1 Cans of beer per week    Comment: 10/30/2014 might have a beer a couple times/yr   Drug use: No

## 2024-08-26 NOTE — Transfer of Care (Signed)
 Immediate Anesthesia Transfer of Care Note  Patient: Cody Silva  Procedure(s) Performed: CYSTOSCOPY/URETEROSCOPY/HOLMIUM LASER/STENT PLACEMENT (Right: Penis)  Patient Location: PACU  Anesthesia Type:MAC  Level of Consciousness: drowsy  Airway & Oxygen Therapy: Patient Spontanous Breathing and Patient connected to face mask oxygen  Post-op Assessment: Report given to RN and Post -op Vital signs reviewed and stable  Post vital signs: Reviewed and stable  Last Vitals:  Vitals Value Taken Time  BP 105/69 08/26/24 12:06  Temp    Pulse 49 08/26/24 12:10  Resp 14 08/26/24 12:10  SpO2 100 % 08/26/24 12:10  Vitals shown include unfiled device data.  Last Pain:  Vitals:   08/26/24 1002  TempSrc:   PainSc: 0-No pain         Complications: No notable events documented.

## 2024-08-26 NOTE — Progress Notes (Signed)
 58 veteran CVA 2014, TIA 09/2015 supposed to be on Plavix  statin--- subsequent?  Seizure/loss of consciousness 2018 admission supposed to be on Depakote //AED Syncope with orthostasis + bulbar symptoms?  ALS Legally blind and his wife gives him his medications-had a surgery on the left eye leaving him completely blind on that side Prior healed right foot wound in the setting of Streptococcus pyogenes bacteremia in 2019 OSA DM TY 2--- does not take insulin  anymore-claims  God cured him of this after a pastor visited him at church CKD 4 with baseline creatinine 4.8--[facility] Lonell Motto--- he is scared of proceeding with HD because he does not want a stent placed in his arms [we briefly discussed that there may be other options] Underlying mild thrombocytopenia Hospitalization 7/22-7/24/2025 syncope/fall positive orthostasis EF 60-65% heart valves normal-V. tach during admission recommended compression stockings and live cardiac monitor placed to determine if arrhythmia was contributing  Returns to hospital 12/22 right-sided flank pain nausea vomiting-CT showed 3 mm UVJ stone-Dr. Shane of urology was consulted hospitalist consulted and admitted patient  Hypertensive on arrival clonidine  patch replaced  Nursing was requested to place Foley if retaining >250 cc   O/e BP (!) 184/88   Pulse 67   Temp 98.1 F (36.7 C) (Oral)   Resp 19   Ht 6' 4 (1.93 m)   Wt 68.9 kg   SpO2 100%   BMI 18.49 kg/m  Awake coherent no distress a little bit tremulous No icterus no pallor neck soft supple poorly nourished quite frail appearing supra clavicular as well as bitemporal wasting No thyromegaly S1-S2 seems to be in sinus ROM intact Abdomen soft no rebound Power grossly 5/5   LAB:-As above  P Transfer to [facility] for definitive urological surgery at request of urologist--care order placed in epic to inform him of patient arriva --holding Plavix , pain control  Dilaudid  0.5 every 4 May use Tylenol  additionally --Bladder scan pending nurse to let me know if retaining [he usually has a good stream of urine he says and empties his bladder completely] --Start IV fluid 125 cc/H--- he still produces urine, is not anuric --- urine culture not yet obtained --stat order for nursing to obtain the same-would cover with Rocephin  given nidus of infection with stone ---trend lactic acid for now, a.m. labs  Hypertensive urgency will be treated with his usual home meds of amlodipine  5 clonidine  patch  CKD 4 not on HD yet-careful with use of Lexapro , gabapentin  both resumed  Legally blind-probably will need reevaluation in the outpatient setting  A1c 4.5 recently-would not cover unless sugars are above 180  Incidental L2 compression fracture--?  Malignancy versus stress fracture--- [facility]-eval PT OT prior to discharge  Transferring to [facility] later today for definitive surgery-patient is quite stable hemodynamically other than elevated blood pressure

## 2024-08-26 NOTE — Op Note (Signed)
 Operative note  Preoperative diagnosis: Right distal ureteral stone  Postop diagnosis: BPH and right distal ureteral stone  Procedures performed: Cystoscopy Right ureteroscopy with laser lithotripsy Right retrograde pyelogram Transurethral resection of prostate Right retrograde stent placement Ureteral calculus removal Fluoroscopy with intraoperative interpretation  Operative findings: No tumors or stones in the bladder Very large prostate with extremely large median lobe Severe angulation of the right ureter that was blocked by the median lobe Median lobe required partial resection to access the right ureter Right ureter required Kumpe catheter access for the correct angulation. Stone removed removed 4.8 x 26 double-J stent placed with strings Due to prostate bleeding patient was placed on CBI in the case Stone was very friable likely matrix stone  Anesthesia: General  Antibiotics: Ceftriaxone   Fluids: Per anesthesia  EBL: 30 cc  Drains: 4.8 x 26 double-J stent in the right ureter with strings, 20 French three-way catheter with 30 cc in balloon on CBI  Specimens: Prostate chips  Right retrograde pyelogram interpretation: No filling defects within the ureter slightly dilated distal ureter.  No contrast extravasation  Indication procedure: Mr. Myra is a 78 year old male with a history of CKD stage V initially did not want diagnosis not desirous diagnosis who presented to the ER after having increasing kidney function found of a right distal ureteral stone as well as right flank pain he send the OR today for right ureteroscopy with laser lithotripsy.  Procedure in detail: After informed consent was from the operative area patient taken operative suite placed under anesthesia by the anesthesia team.  He is then placed in the dorsolithotomy station then prepped and draped in usual sterile fashion next a 22 French cystoscope was Gaile into the bladder upon events and the bladder  was noted to have the patient was noted to have a very large median lobe attempts to visualize the ureter were unsuccessful on both sides visualization the bladder demonstrated no tumors or other abnormalities other than a large prostate. After spending some time to find the right ureteral orifice the prostate started bleed so the 22 French scope was exchanged for the 26 French resectoscope this was advanced into the bladder to the right ureter the decision was made to resect part of the right side of the median lobe this was done so eventually the ureter was able to be identified.  All the chips were then removed from the bladder.  Due to the angulation it was felt that comfy catheter would need to access the ureter so we exchanged the resectoscope for a laser bridge and placed a Kumpe catheter into the bladder we able to place a sensor wire into the right ureter.  Wire placement was confirmed with fluoroscopy.  Then a 4.5 French semirigid ureteroscope was advanced into the bladder the ureteral orifice was identified using assessment where a sensor wire was able to gain access to the ureter the stone was then encountered in the distal ureter it was basketed out due to the size.  We then readvanced into the distal ureter moving up to the mid ureter no no more stones a retrograde pyelogram performed demonstrated the findings as noted above.  We then removed the cystoscope and backloaded a 4.8 by 26 cm stent into the right ureter under fluoroscopic guidance for placement in the kidney and the bladder was confirmed.  Next the resectoscope was placed back into the bladder all bleeding areas were cauterized all the prostate chips were removed scope was removed and then it 22  French three-way catheter was placed in the bladder 30 cc inflated balloon placement placed on CBI the strings of the stent were then tied to the catheter.  Patient was extubated operatively and tolerated procedure well  Disposition:  Patient will be admitted to the medicine team will remain on CBI.  Stent is taped to the catheter and will be removed when catheter is exchanged.

## 2024-08-26 NOTE — ED Notes (Signed)
 Pt bladder scanned for 350, then voided 100 mL.  Per Dr. Samtani, foley to be placed if scan greater than 250 mL.  Foley catheter placed and approx. 250 mL drained initially.

## 2024-08-26 NOTE — H&P (Signed)
 " History and Physical    Cody Silva FMW:[id number] DOB: [birth year] DOA: 08/25/2024  PCP: [facility]  Patient coming from: Home  Chief Complaint: Right sided abdominal/flank pain  HPI: Cody Silva is a 78 y.o. male with medical history significant of CVA, brain impairment, CKD stage V has previously declined dialysis, type 2 diabetes, hypertension, ascending thoracic aortic aneurysm, depression, gout, OSA, orthostatic hypotension, recurrent syncope, pancytopenia, blindness presenting with complaints of severe right sided abdominal/flank pain and vomiting.  Symptoms started over 24 hours ago.  He vomited twice at home.  Denies fevers, constipation, diarrhea, urinary symptoms, or hematuria.  Denies chest pain or shortness of breath.  Patient states he took off his clonidine  patch yesterday when he was taking a shower and forgot to reapply it.  ED Course: Patient noted to be hypertensive with SBP up to 200s.  Blood pressure improved after clonidine  patch was reapplied in the ED.  Afebrile and not tachycardic.    Labs showing no leukocytosis, hemoglobin 10.2 (stable), MCV 91.1, platelet count 140k (stable), BUN 50, creatinine 5.57 (previously 4.2-4.8 in July 2025), normal lipase and LFTs.      CT abdomen pelvis showing right renal and perirenal edema, peripelvic and periureteral edema, and moderate hydroureteronephrosis.  A 3 mm calcification at the distal right ureter/UVJ suspicious for ureteral stone or a phlebolith.  Findings are consistent with obstructive urolithiasis if this is a stone or could be due to recently passed stone or an ascending UTI.  UA with negative nitrite, small leukocytes, 6-10 WBCs, and no bacteria.  Patient was also given fentanyl , Zofran , and 1 L IV fluids.  ED physician discussed the case with on-call urologist Dr. Shane who plans on taking the patient to the OR in the morning and requesting patient to be admitted to [facility] long hospital by  hospitalist service.  Review of Systems:  ROS  Past Medical History:  Diagnosis Date   Anemia    Aneurysm of aorta    [facility] [place]   Arthritis    right arm, right ankle, right side (10/30/2014)   Chronic kidney disease    Colon polyps    Depression    Diabetic neuropathy (HCC)    Diabetic retinopathy (HCC)    H/O agent Orange exposure    Headache    @ least 3 times/wk (10/30/2014)   History of blood transfusion 10/30/2014   hematochezia   History of gout    Hypertension    Neuromuscular disorder (HCC)    Diabetic neuropathy   OSA on CPAP    does not wear   PTSD (post-traumatic stress disorder)    service related   Stroke (HCC) 2014   left extremity deficits; facial left   Type II diabetes mellitus (HCC)    hx of no meds    Past Surgical History:  Procedure Laterality Date   BONE GRAFT HIP ILIAC CREST Left ~ 1976   CATARACT EXTRACTION W/ INTRAOCULAR LENS  IMPLANT, BILATERAL Bilateral 2014-2015   EP IMPLANTABLE DEVICE N/A 12/24/2015   Procedure: Loop Recorder Insertion;  Surgeon: Lynwood Rakers, MD;  Location: [facility] INVASIVE CV LAB;  Service: Cardiovascular;  Laterality: N/A;   EYE SURGERY     bleed behind eye   PARATHYROIDECTOMY Left 01/03/2022   Procedure: LEFT INFERIOR PARATHYROIDECTOMY;  Surgeon: Eletha Boas, MD;  Location: [facility] ORS;  Service: General;  Laterality: Left;   TEE WITHOUT CARDIOVERSION N/A 12/07/2017   Procedure: TRANSESOPHAGEAL ECHOCARDIOGRAM (TEE);  Surgeon:  Okey Vina GAILS, MD;  Location: [facility] ENDOSCOPY;  Service: Cardiovascular;  Laterality: N/A;   TUMOR REMOVAL Right ~ 1976   arm; had to take bone left hip to add to the repair   elbow     reports that he has never smoked. He has never used smokeless tobacco. He reports that he does not currently use alcohol  after a past usage of about 1.0 standard drink of alcohol  per week. He reports that he does not use drugs.  Allergies[1]  Family History  Problem Relation Age  of Onset   Diabetes Mother    Breast cancer Mother    Heart attack Mother        CABG - Age 19   Kidney disease Mother    Stroke Brother    Lung disease Father    Neurofibromatosis Maternal Uncle    Throat cancer Brother    Hypertension Brother     Prior to Admission medications  Medication Sig Start Date End Date Taking? Authorizing Provider  acetaminophen  (TYLENOL ) 500 MG tablet Take 1 tablet (500 mg total) by mouth every 6 (six) hours as needed for mild pain, moderate pain, fever or headache. Patient taking differently: Take 1,000 mg by mouth daily as needed for mild pain (pain score 1-3), moderate pain (pain score 4-6), fever or headache. 11/01/14   Margarie Cutter, MD  albuterol  (PROVENTIL  HFA;VENTOLIN  HFA) 108 (90 Base) MCG/ACT inhaler Inhale 1 puff into the lungs every 6 (six) hours as needed for wheezing or shortness of breath.    [provider]  amLODipine  (NORVASC ) 5 MG tablet Take 1 tablet (5 mg total) by mouth at bedtime. 03/27/24   Shitarev, Dimitry, MD  ammonium lactate (LAC-HYDRIN) 12 % lotion Apply 1 application topically as needed for dry skin.    [provider]  atorvastatin  (LIPITOR) 40 MG tablet Take 40 mg by mouth at bedtime.  01/07/20   [provider]  cloNIDine  (CATAPRES  - DOSED IN MG/24 HR) 0.1 mg/24hr patch Place 0.1 mg onto the skin once a week.    [provider]  clopidogrel  (PLAVIX ) 75 MG tablet Take 1 tablet (75 mg total) by mouth at bedtime. Patient taking differently: Take 75 mg by mouth in the morning. 11/08/14   Margarie Cutter, MD  dorzolamide -timolol  (COSOPT ) 22.3-6.8 MG/ML ophthalmic solution Place 1 drop into both eyes 2 (two) times daily.    [provider]  escitalopram  (LEXAPRO ) 10 MG tablet Take 10 mg by mouth at bedtime.  11/26/19   [provider]  ferrous sulfate  325 (65 FE) MG tablet Take 325 mg by mouth every Monday, Wednesday, and Friday.    [provider]  fluticasone  (FLONASE ) 50  MCG/ACT nasal spray Place 1 spray into both nostrils as needed for allergies.     [provider]  gabapentin  (NEURONTIN ) 300 MG capsule Take 300 mg by mouth at bedtime.    [provider]  hydrALAZINE  (APRESOLINE ) 10 MG tablet TAKE HYDRALAZINE  10 MG BY MOUTH TWICE DAILY AS NEEDED IF BLOOD PRESSURE IS 160/100 04/22/24   Goodrich, Callie E, PA-C  latanoprost  (XALATAN ) 0.005 % ophthalmic solution Place 1 drop into both eyes at bedtime.     [provider]  terbinafine (LAMISIL) 1 % cream Apply 1 application  topically daily as needed (irritation (fungal)).    [provider]  tolnaftate (TINACTIN) 1 % powder Apply 1 application  topically daily as needed (jock itch).    [provider]    Physical Exam:  Vitals:   08/26/24 0215 08/26/24 0230 08/26/24 0233 08/26/24 0330  BP: (!) 194/88 (!) 196/88  (!) 184/88  Pulse: 73 71  67  Resp: 13 16  19   Temp:   98.3 F (36.8 C)   TempSrc:   Oral   SpO2: 100% 100%  100%  Weight:      Height:        Physical Exam  Labs on Admission: I have personally reviewed following labs and imaging studies  CBC: Recent Labs  Lab 08/25/24 1648  WBC 4.9  HGB 10.2*  HCT 30.6*  MCV 91.1  PLT 140*   Basic Metabolic Panel: Recent Labs  Lab 08/25/24 1648  NA 142  K 3.7  CL 107  CO2 22  GLUCOSE 100*  BUN 50*  CREATININE 5.57*  CALCIUM  9.6   GFR: Estimated Creatinine Clearance: 10.8 mL/min (A) (by C-G formula based on SCr of 5.57 mg/dL (H)). Liver Function Tests: Recent Labs  Lab 08/25/24 1648  AST 16  ALT 6  ALKPHOS 68  BILITOT 0.6  PROT 7.0  ALBUMIN 4.2   Recent Labs  Lab 08/25/24 1648  LIPASE 25   No results for input(s): AMMONIA in the last 168 hours. Coagulation Profile: No results for input(s): INR, PROTIME in the last 168 hours. Cardiac Enzymes: No results for input(s): CKTOTAL, CKMB, CKMBINDEX, TROPONINI in the last 168 hours. BNP (last 3 results) No results for  input(s): PROBNP in the last 8760 hours. HbA1C: No results for input(s): HGBA1C in the last 72 hours. CBG: No results for input(s): GLUCAP in the last 168 hours. Lipid Profile: No results for input(s): CHOL, HDL, LDLCALC, TRIG, CHOLHDL, LDLDIRECT in the last 72 hours. Thyroid  Function Tests: No results for input(s): TSH, T4TOTAL, FREET4, T3FREE, THYROIDAB in the last 72 hours. Anemia Panel: No results for input(s): VITAMINB12, FOLATE, FERRITIN, TIBC, IRON, RETICCTPCT in the last 72 hours. Urine analysis:    Component Value Date/Time   COLORURINE YELLOW 08/25/2024 1648   APPEARANCEUR HAZY (A) 08/25/2024 1648   LABSPEC 1.013 08/25/2024 1648   PHURINE 5.0 08/25/2024 1648   GLUCOSEU 50 (A) 08/25/2024 1648   HGBUR SMALL (A) 08/25/2024 1648   BILIRUBINUR NEGATIVE 08/25/2024 1648   KETONESUR NEGATIVE 08/25/2024 1648   PROTEINUR >=300 (A) 08/25/2024 1648   UROBILINOGEN 1.0 11/02/2014 2301   NITRITE NEGATIVE 08/25/2024 1648   LEUKOCYTESUR SMALL (A) 08/25/2024 1648    Radiological Exams on Admission: CT ABDOMEN PELVIS WO CONTRAST Result Date: 08/26/2024 EXAM: CT ABDOMEN AND PELVIS WITHOUT CONTRAST 08/26/2024 12:20:20 AM TECHNIQUE: CT of the abdomen and pelvis was performed without the administration of intravenous contrast. Multiplanar reformatted images are provided for review. Automated exposure control, iterative reconstruction, and/or weight-based adjustment of the mA/kV was utilized to reduce the radiation dose to as low as reasonably achievable. COMPARISON: None available. CLINICAL HISTORY: Abdominal/flank pain, stone suspected. Right lower quadrant pain with onset last night with nausea and vomiting. FINDINGS: LOWER CHEST: The lung bases are clear of infiltrates. There is mild elevation of the right hemidiaphragm. The heart is slightly enlarged. There is linear scarring or atelectasis in both lower lobes. LIVER: There are calcified granulomas in the  left lobe of the liver with no other focal abnormality of the unenhanced liver. GALLBLADDER AND BILE DUCTS: There is layering low attenuation which could be sludge or noncalcified stones in the gallbladder without wall thickening or biliary dilatation. SPLEEN: No acute abnormality. PANCREAS: No acute abnormality. ADRENAL GLANDS: No acute abnormality. KIDNEYS, URETERS AND BLADDER:  There is no contour deforming mass of the unenhanced kidneys. There is a Bosniak 1 cyst posteriorly in the left kidney measuring 1.3 cm and 16 HU units. Per consensus, no follow-up is needed for simple Bosniak type 1 and 2 renal cysts, unless the patient has a malignancy history or risk factors. On the right, there is both renal and perirenal edema, peripelvic and periureteral stranding edema, and moderate hydroureteronephrosis. There is a 3 mm calcification unable to be excluded from the plane of the distal right ureter / UVJ on series 2 axial 63, but this may be a phlebolith because it does not have a surrounding cuff of soft tissue. If this is a ureteral stone the findings are consistent with obstructive urolithiasis. If there is no ureteral stone, then the findings could be due to a recently passed stone or an ascending UTI. There are no calyceal stones in either kidney. No intravesical stone is seen. No other suggestion of ureteral stones. There is mild bladder thickening, which is probably due to chronic bladder outlet obstruction related to the enlarged prostate. The prostate measures up to 6.7 cm in diameter with a prominent impression into the inferior bladder by disproportionate median lobe hypertrophy. There are multiple pelvic phleboliths. GI AND BOWEL: Stomach demonstrates no acute abnormality. There is no bowel obstruction or inflammation. The appendix is normal caliber. PERITONEUM AND RETROPERITONEUM: There is trace pelvic ascites. No free air. Slight mesenteric congestion. VASCULATURE: Aorta is normal in caliber. There is  heavy aortic and branch vessel atherosclerosis without aneurysm. LYMPH NODES: No lymphadenopathy. REPRODUCTIVE ORGANS: The prostate measures up to 6.7 cm in diameter with a prominent impression into the inferior bladder by disproportionate median lobe hypertrophy. BONES AND SOFT TISSUES: There is mild anterior wedging of the L2 vertebral body with increased patchy sclerosis in the L2 body. This could be a healing response such as due to a subacute compression fracture. Underlying metastasis is not excluded. Follow-up as indicated. Bone Scintigraphy or MRI without and with contrast may be helpful. There are bridging osteophytes over the anterior right SI joint. Mild edema in the body wall is seen. No focal soft tissue abnormality. IMPRESSION: 1. Right renal and perirenal edema, peripelvic and periureteral edema, and moderate hydroureteronephrosis. A 3 mm calcification at the distal right ureter/UVJ may represent a ureteral stone or a phlebolith. Findings are consistent with obstructive urolithiasis if this is a stone; alternatively, findings could be due to a recently passed stone or an ascending UTI. 2. Mild bladder thickening, likely due to chronic bladder outlet obstruction related to the enlarged prostate with disproportionate median lobe hypertrophy. 3. Mild anterior wedging of the L2 vertebral body with increased patchy sclerosis, possibly representing a healing subacute compression fracture. Underlying metastasis is not excluded. Follow-up as indicated, and bone scintigraphy or MRI without and with contrast may be helpful. 4. Mild edema in the body wall and mesentery with minimal societies. Electronically signed by: Francis Quam MD 08/26/2024 01:20 AM EST RP Workstation: [id number]    EKG: Independently reviewed.  Sinus rhythm, no acute ischemic changes.  Assessment and Plan  Obstructive urolithiasis AKI on CKD stage V CT abdomen pelvis showing right renal and perirenal edema, peripelvic and  periureteral edema, and moderate hydroureteronephrosis.  A 3 mm calcification at the distal right ureter/UVJ suspicious for ureteral stone or a phlebolith.  Per radiologist, findings are consistent with obstructive urolithiasis if this is a stone or could be due to recently passed stone or an ascending UTI. UA with negative nitrite,  small leukocytes, 6-10 WBCs, and no bacteria.  No fever, tachycardia, leukocytosis, or signs of sepsis.  Creatinine 5.57 (previously 4.2-4.8 in July 2025).  Urology planning on taking to the OR in the morning and requested admission at [facility].  Keep n.p.o. Continue pain management and antiemetic as needed.  Patient was given 1 L IV fluids in the ED.  Will avoid additional IV fluids as he is hypertensive.  Repeat labs ordered to check renal function and WBC count. Do not think antibiotics are indicated at this time but will obtain urine culture.    Hypertensive urgency Patient took off his clonidine  patch when taking a shower yesterday and forgot to reapply it.  He is compliant with amlodipine .  SBP up to 200s initially and after clonidine  patch was reapplied in the ED, SBP has now improved to 180s.  Continue clonidine  and amlodipine .  IV hydralazine  PRN SBP >160.  Diet controlled diabetes Hemoglobin A1c 4.5 in July 2025.  History of CVA Hold Plavix  at this time in anticipation of urologic procedure as above.  Continue Lipitor.  Chronic normocytic anemia Hemoglobin stable, monitor H&H.  Mild chronic thrombocytopenia Platelet count stable, monitor labs.  Depression Continue Lexapro .  Chronic pain/neuropathy Continue gabapentin .  Incidental finding of subacute lumbar compression fracture on CT CT showing mild anterior wedging of the L2 vertebral body with increased patchy sclerosis, possibly representing healing subacute compression fracture but underlying metastasis is not excluded.  Patient is not endorsing lumbar back pain at this time and no falls  reported.  Radiologist recommending bone scintigraphy or MRI.  Patient will need close outpatient follow-up with PCP for further workup.  DVT prophylaxis: SCDs Code Status: Full Code (discussed with the patient) Level of care: Progressive Care Unit Admission status: It is my clinical opinion that referral for OBSERVATION is reasonable and necessary in this patient based on the above information provided. The aforementioned taken together are felt to place the patient at high risk for further clinical deterioration. However, it is anticipated that the patient may be medically stable for discharge from the hospital within 24 to 48 hours.  Editha Ram MD [facility]  If 7PM-7AM, please contact night-coverage [email or web address]  08/26/2024, 3:58 AM       [1]  Allergies Allergen Reactions   Metformin And Related Diarrhea   Amlodipine  Swelling    Edema. Wife stated the pt is not allergic to this medication.    Felodipine Swelling    Edema    Lactose Intolerance (Gi) Other (See Comments)    Gi upset   Pseudoephedrine-Acetaminophen  Nausea And Vomiting   Terazosin Other (See Comments)    syncope   Tylenol  With Codeine #3 [Acetaminophen -Codeine] Nausea Only    Patient can take regular tylenol  with no problems   Tape Rash   "

## 2024-08-26 NOTE — Progress Notes (Signed)
" ° ° °  PROCEDURAL EXPEDITER PROGRESS NOTE  Patient Name: Cody Silva  DOB:31-Jan-1946 Date of Admission: 08/25/2024  Date of Assessment:08/26/2024   -------------------------------------------------------------------------------------------------------------------   Brief clinical summary: Pt to OR today for cysto/ureteroscopy and stent placement  Orders in place:  No   Communication with surgical team if no orders: N/A Surgeon will do a formal consult and informed consent in pre-op at [facility], test, and orders reviewed: Y  Requires surgical clearance: N  Barriers noted: Transportation between campuses   Intervention provided by [facility] team: Verified with [facility] to make sure they we aware of transport from [facility] ED to [facility] Pre-op. [facility] to have pt at [facility] around 1030.  Barrier resolved:  yes   -------------------------------------------------------------------------------------------------------------------  [employer], [place], [place] Please contact us  directly via secure chat (search for [facility]) or by calling us  at [phone] [facility]).  "

## 2024-08-26 NOTE — Anesthesia Procedure Notes (Signed)
 Procedure Name: LMA Insertion Date/Time: 08/26/2024 10:37 AM  Performed by: Deeann Eva BROCKS, CRNAPre-anesthesia Checklist: Patient identified, Emergency Drugs available, Suction available and Patient being monitored Patient Re-evaluated:Patient Re-evaluated prior to induction Oxygen Delivery Method: Circle System Utilized Preoxygenation: Pre-oxygenation with 100% oxygen Induction Type: IV induction Ventilation: Mask ventilation without difficulty LMA: LMA inserted LMA Size: 4.0 and 5.0 Number of attempts: 1 Airway Equipment and Method: Bite block Placement Confirmation: positive ETCO2 Tube secured with: Tape Dental Injury: Teeth and Oropharynx as per pre-operative assessment  Comments: Placed 4 LMA x1 unable to obtain seal. Removed and place 5 LMA x1 successful.

## 2024-08-27 ENCOUNTER — Encounter (HOSPITAL_COMMUNITY): Payer: Self-pay | Admitting: Urology

## 2024-08-27 ENCOUNTER — Inpatient Hospital Stay (HOSPITAL_COMMUNITY)

## 2024-08-27 DIAGNOSIS — N211 Calculus in urethra: Secondary | ICD-10-CM | POA: Diagnosis not present

## 2024-08-27 DIAGNOSIS — N200 Calculus of kidney: Secondary | ICD-10-CM | POA: Diagnosis not present

## 2024-08-27 LAB — CBC WITH DIFFERENTIAL/PLATELET
Abs Immature Granulocytes: 0.07 K/uL (ref 0.00–0.07)
Basophils Absolute: 0 K/uL (ref 0.0–0.1)
Basophils Relative: 0 %
Eosinophils Absolute: 0 K/uL (ref 0.0–0.5)
Eosinophils Relative: 0 %
HCT: 29.4 % — ABNORMAL LOW (ref 39.0–52.0)
Hemoglobin: 9.8 g/dL — ABNORMAL LOW (ref 13.0–17.0)
Immature Granulocytes: 1 %
Lymphocytes Relative: 7 %
Lymphs Abs: 0.9 K/uL (ref 0.7–4.0)
MCH: 31.2 pg (ref 26.0–34.0)
MCHC: 33.3 g/dL (ref 30.0–36.0)
MCV: 93.6 fL (ref 80.0–100.0)
Monocytes Absolute: 0.8 K/uL (ref 0.1–1.0)
Monocytes Relative: 6 %
Neutro Abs: 11.5 K/uL — ABNORMAL HIGH (ref 1.7–7.7)
Neutrophils Relative %: 86 %
Platelets: 129 K/uL — ABNORMAL LOW (ref 150–400)
RBC: 3.14 MIL/uL — ABNORMAL LOW (ref 4.22–5.81)
RDW: 14 % (ref 11.5–15.5)
WBC: 13.3 K/uL — ABNORMAL HIGH (ref 4.0–10.5)
nRBC: 0 % (ref 0.0–0.2)

## 2024-08-27 LAB — BASIC METABOLIC PANEL WITH GFR
Anion gap: 11 (ref 5–15)
BUN: 48 mg/dL — ABNORMAL HIGH (ref 8–23)
CO2: 19 mmol/L — ABNORMAL LOW (ref 22–32)
Calcium: 9.2 mg/dL (ref 8.9–10.3)
Chloride: 115 mmol/L — ABNORMAL HIGH (ref 98–111)
Creatinine, Ser: 5.26 mg/dL — ABNORMAL HIGH (ref 0.61–1.24)
GFR, Estimated: 11 mL/min — ABNORMAL LOW
Glucose, Bld: 110 mg/dL — ABNORMAL HIGH (ref 70–99)
Potassium: 4 mmol/L (ref 3.5–5.1)
Sodium: 145 mmol/L (ref 135–145)

## 2024-08-27 LAB — HEMOGLOBIN AND HEMATOCRIT, BLOOD
HCT: 24.3 % — ABNORMAL LOW (ref 39.0–52.0)
Hemoglobin: 8 g/dL — ABNORMAL LOW (ref 13.0–17.0)

## 2024-08-27 LAB — URINE CULTURE: Culture: NO GROWTH

## 2024-08-27 LAB — SURGICAL PATHOLOGY

## 2024-08-27 MED ORDER — IPRATROPIUM-ALBUTEROL 0.5-2.5 (3) MG/3ML IN SOLN: 3.0000 mL | RESPIRATORY_TRACT | Status: DC | PRN

## 2024-08-27 MED ORDER — AMLODIPINE BESYLATE 10 MG PO TABS
10.0000 mg | ORAL_TABLET | Freq: Every day | ORAL | Status: DC
  Administered 2024-08-28 – 2024-09-09 (×13): 10 mg via ORAL
  Filled 2024-08-27 (×6): qty 1

## 2024-08-27 MED ORDER — FERROUS SULFATE 325 (65 FE) MG PO TABS
325.0000 mg | ORAL_TABLET | ORAL | Status: DC
  Administered 2024-08-27 – 2024-09-10 (×8): 325 mg via ORAL
  Filled 2024-08-27 (×5): qty 1

## 2024-08-27 MED ORDER — IOHEXOL 300 MG/ML  SOLN
50.0000 mL | Freq: Once | INTRAMUSCULAR | Status: AC | PRN
  Administered 2024-08-27: 50 mL

## 2024-08-27 MED ORDER — METOPROLOL TARTRATE 5 MG/5ML IV SOLN: 5.0000 mg | INTRAVENOUS | Status: DC | PRN

## 2024-08-27 MED ORDER — CALCITRIOL 0.25 MCG PO CAPS
0.2500 ug | ORAL_CAPSULE | Freq: Every day | ORAL | Status: DC
  Administered 2024-08-27 – 2024-09-10 (×14): 0.25 ug via ORAL
  Filled 2024-08-27 (×8): qty 1

## 2024-08-27 MED ORDER — SODIUM CHLORIDE 0.9 % IV SOLN: INTRAVENOUS | Status: AC

## 2024-08-27 MED ORDER — HYDRALAZINE HCL 20 MG/ML IJ SOLN
10.0000 mg | INTRAMUSCULAR | Status: DC | PRN
  Administered 2024-08-27 (×2): 10 mg via INTRAVENOUS
  Filled 2024-08-27 (×2): qty 1

## 2024-08-27 MED ORDER — GLUCAGON HCL RDNA (DIAGNOSTIC) 1 MG IJ SOLR: 1.0000 mg | INTRAMUSCULAR | Status: DC | PRN

## 2024-08-27 NOTE — Progress Notes (Signed)
 " PROGRESS NOTE    Cody Silva  FMW:[id number] DOB: 05-06-1946 DOA: 08/25/2024 PCP: [facility]    Brief Narrative:   78 y.o. male with medical history significant of CVA, brain impairment, CKD stage V has previously declined dialysis, type 2 diabetes, hypertension, ascending thoracic aortic aneurysm, depression, gout, OSA, orthostatic hypotension, recurrent syncope, pancytopenia, blindness presenting with complaints of severe right sided abdominal/flank pain and vomiting.  Symptoms started over 24 hours ago.  Patient was found to have BPH with obstructive uropathy/nephropathy/AKI.  Plan cystoscopy by urology on 12/23 with laser lithotripsy, pyelogram, TURP, stone removal and stent placement.  Following this still having hematuria.  Continuing CBI  Assessment & Plan:    Obstructive urolithiasis AKI on CKD stage V Patient was found to have BPH with obstructive uropathy/nephropathy/AKI.  Plan cystoscopy by urology on 12/23 with laser lithotripsy, pyelogram, TURP, stone removal and stent placement.  Following this still having hematuria.  Continuing CBI Continue fluids.  Rest of the management per urology - Continue CBI, empiric Rocephin  - Baseline creatinine 4.5, admission creatinine 5.5      Hypertensive urgency Improved but still elevated.  Continue Norvasc  and clonidine , will increase IV as needed   Diet controlled diabetes Hemoglobin A1c 4.5 in July 2025.   History of CVA Holding Plavix  while having hematuria - Continue statin   Chronic normocytic anemia Hemoglobin stable, monitor H&H.   Mild chronic thrombocytopenia Platelet count stable, monitor labs.   Depression Continue Lexapro .   Chronic pain/neuropathy Continue gabapentin .   Incidental finding of subacute lumbar compression fracture on CT CT showing mild anterior wedging of the L2 vertebral body with increased patchy sclerosis, possibly representing healing subacute compression fracture but  underlying metastasis is not excluded.  Patient is not endorsing lumbar back pain at this time and no falls reported.  Radiologist recommending bone scintigraphy or MRI.  Patient will need close outpatient follow-up with PCP for further workup.  DVT prophylaxis: SCDs Start: 08/26/24 0511      Code Status: Full Code Family Communication:   Status is: Inpatient Remains inpatient appropriate because: Ongoing management for hematuria   PT Follow up Recs:   Subjective: Seen at bedside, still having some right-sided flank discomfort Hematuria still persist.  CBI ongoing   Examination:  General exam: Appears calm and comfortable  Respiratory system: Clear to auscultation. Respiratory effort normal. Cardiovascular system: S1 & S2 heard, RRR. No JVD, murmurs, rubs, gallops or clicks. No pedal edema. Gastrointestinal system: Abdomen is nondistended, soft and nontender. No organomegaly or masses felt. Normal bowel sounds heard. Central nervous system: Alert and oriented. No focal neurological deficits. Extremities: Symmetric 5 x 5 power. Skin: No rashes, lesions or ulcers Psychiatry: Judgement and insight appear normal. Mood & affect appropriate. Catheter in place for CBI               Diet Orders (From admission, onward)     Start     Ordered   08/26/24 1153  Diet renal/carb modified with fluid restriction Diet-HS Snack? Nothing; Fluid restriction: 1200 mL Fluid; Room service appropriate? Yes; Fluid consistency: Thin  Diet effective now       Question Answer Comment  Diet-HS Snack? Nothing   Fluid restriction: 1200 mL Fluid   Room service appropriate? Yes   Fluid consistency: Thin      08/26/24 1152            Objective: Vitals:   08/27/24 0130 08/27/24 0307 08/27/24 0624 08/27/24 1109  BP:  ROLLEN)  151/71 (!) 172/92 (!) 173/96  Pulse:  74 83 79  Resp:  20 18 16   Temp: 98.9 F (37.2 C) 98.4 F (36.9 C) 98 F (36.7 C) 98.3 F (36.8 C)  TempSrc: Oral Oral Oral  Oral  SpO2:  100% 100% 100%  Weight:      Height:        Intake/Output Summary (Last 24 hours) at 08/27/2024 1129 Last data filed at 08/27/2024 1055 Gross per 24 hour  Intake 18970.49 ml  Output 48249 ml  Net -32779.51 ml   Filed Weights   08/25/24 1642 08/26/24 1625  Weight: 68.9 kg 69.3 kg    Scheduled Meds:  amLODipine   10 mg Oral QHS   atorvastatin   40 mg Oral QHS   calcitRIOL   0.25 mcg Oral Daily   Chlorhexidine  Gluconate Cloth  6 each Topical Daily   cloNIDine   0.1 mg Transdermal Once   dorzolamide -timolol   1 drop Both Eyes BID   escitalopram   10 mg Oral QHS   ferrous sulfate   325 mg Oral Q M,W,F   gabapentin   300 mg Oral QHS   latanoprost   1 drop Both Eyes QHS   Continuous Infusions:  sodium chloride  75 mL/hr at 08/27/24 0914   cefTRIAXone  (ROCEPHIN )  IV 1 g (08/27/24 9166)   sodium chloride  irrigation      Nutritional status     Body mass index is 18.6 kg/m.  Data Reviewed:   CBC: Recent Labs  Lab 08/25/24 1648 08/26/24 0547 08/27/24 0513  WBC 4.9 4.9 13.3*  NEUTROABS  --   --  11.5*  HGB 10.2* 9.3* 9.8*  HCT 30.6* 28.3* 29.4*  MCV 91.1 93.7 93.6  PLT 140* 123* 129*   Basic Metabolic Panel: Recent Labs  Lab 08/25/24 1648 08/26/24 0547 08/27/24 0513  NA 142 143 145  K 3.7 3.6 4.0  CL 107 111 115*  CO2 22 21* 19*  GLUCOSE 100* 88 110*  BUN 50* 50* 48*  CREATININE 5.57* 5.55* 5.26*  CALCIUM  9.6 9.1 9.2   GFR: Estimated Creatinine Clearance: 11.5 mL/min (A) (by C-G formula based on SCr of 5.26 mg/dL (H)). Liver Function Tests: Recent Labs  Lab 08/25/24 1648  AST 16  ALT 6  ALKPHOS 68  BILITOT 0.6  PROT 7.0  ALBUMIN 4.2   Recent Labs  Lab 08/25/24 1648  LIPASE 25   No results for input(s): AMMONIA in the last 168 hours. Coagulation Profile: No results for input(s): INR, PROTIME in the last 168 hours. Cardiac Enzymes: No results for input(s): CKTOTAL, CKMB, CKMBINDEX, TROPONINI in the last 168 hours. BNP  (last 3 results) No results for input(s): PROBNP in the last 8760 hours. HbA1C: No results for input(s): HGBA1C in the last 72 hours. CBG: Recent Labs  Lab 08/26/24 0947 08/26/24 1210 08/26/24 1245  GLUCAP 82 76 127*   Lipid Profile: No results for input(s): CHOL, HDL, LDLCALC, TRIG, CHOLHDL, LDLDIRECT in the last 72 hours. Thyroid  Function Tests: No results for input(s): TSH, T4TOTAL, FREET4, T3FREE, THYROIDAB in the last 72 hours. Anemia Panel: No results for input(s): VITAMINB12, FOLATE, FERRITIN, TIBC, IRON, RETICCTPCT in the last 72 hours. Sepsis Labs: Recent Labs  Lab 08/26/24 0840 08/26/24 1703  LATICACIDVEN 0.8 1.0    Recent Results (from the past 240 hours)  Urine Culture (for pregnant, neutropenic or urologic patients or patients with an indwelling urinary catheter)     Status: None   Collection Time: 08/26/24  8:40 AM   Specimen: Urine, Clean Catch  Result Value Ref Range Status   Specimen Description URINE, CLEAN CATCH  Final   Special Requests NONE  Final   Culture   Final    NO GROWTH Performed at [facility], [address] [place], [place] [address]    Report Status 08/27/2024 FINAL  Final         Radiology Studies: DG C-Arm 1-60 Min-No Report Result Date: 08/26/2024 Fluoroscopy was utilized by the requesting physician.  No radiographic interpretation.   DG C-Arm 1-60 Min-No Report Result Date: 08/26/2024 Fluoroscopy was utilized by the requesting physician.  No radiographic interpretation.   CT ABDOMEN PELVIS WO CONTRAST Result Date: 08/26/2024 EXAM: CT ABDOMEN AND PELVIS WITHOUT CONTRAST 08/26/2024 12:20:20 AM TECHNIQUE: CT of the abdomen and pelvis was performed without the administration of intravenous contrast. Multiplanar reformatted images are provided for review. Automated exposure control, iterative reconstruction, and/or weight-based adjustment of the mA/kV was utilized to reduce the  radiation dose to as low as reasonably achievable. COMPARISON: None available. CLINICAL HISTORY: Abdominal/flank pain, stone suspected. Right lower quadrant pain with onset last night with nausea and vomiting. FINDINGS: LOWER CHEST: The lung bases are clear of infiltrates. There is mild elevation of the right hemidiaphragm. The heart is slightly enlarged. There is linear scarring or atelectasis in both lower lobes. LIVER: There are calcified granulomas in the left lobe of the liver with no other focal abnormality of the unenhanced liver. GALLBLADDER AND BILE DUCTS: There is layering low attenuation which could be sludge or noncalcified stones in the gallbladder without wall thickening or biliary dilatation. SPLEEN: No acute abnormality. PANCREAS: No acute abnormality. ADRENAL GLANDS: No acute abnormality. KIDNEYS, URETERS AND BLADDER: There is no contour deforming mass of the unenhanced kidneys. There is a Bosniak 1 cyst posteriorly in the left kidney measuring 1.3 cm and 16 HU units. Per consensus, no follow-up is needed for simple Bosniak type 1 and 2 renal cysts, unless the patient has a malignancy history or risk factors. On the right, there is both renal and perirenal edema, peripelvic and periureteral stranding edema, and moderate hydroureteronephrosis. There is a 3 mm calcification unable to be excluded from the plane of the distal right ureter / UVJ on series 2 axial 63, but this may be a phlebolith because it does not have a surrounding cuff of soft tissue. If this is a ureteral stone the findings are consistent with obstructive urolithiasis. If there is no ureteral stone, then the findings could be due to a recently passed stone or an ascending UTI. There are no calyceal stones in either kidney. No intravesical stone is seen. No other suggestion of ureteral stones. There is mild bladder thickening, which is probably due to chronic bladder outlet obstruction related to the enlarged prostate. The prostate  measures up to 6.7 cm in diameter with a prominent impression into the inferior bladder by disproportionate median lobe hypertrophy. There are multiple pelvic phleboliths. GI AND BOWEL: Stomach demonstrates no acute abnormality. There is no bowel obstruction or inflammation. The appendix is normal caliber. PERITONEUM AND RETROPERITONEUM: There is trace pelvic ascites. No free air. Slight mesenteric congestion. VASCULATURE: Aorta is normal in caliber. There is heavy aortic and branch vessel atherosclerosis without aneurysm. LYMPH NODES: No lymphadenopathy. REPRODUCTIVE ORGANS: The prostate measures up to 6.7 cm in diameter with a prominent impression into the inferior bladder by disproportionate median lobe hypertrophy. BONES AND SOFT TISSUES: There is mild anterior wedging of the L2 vertebral body with increased patchy sclerosis in the L2  body. This could be a healing response such as due to a subacute compression fracture. Underlying metastasis is not excluded. Follow-up as indicated. Bone Scintigraphy or MRI without and with contrast may be helpful. There are bridging osteophytes over the anterior right SI joint. Mild edema in the body wall is seen. No focal soft tissue abnormality. IMPRESSION: 1. Right renal and perirenal edema, peripelvic and periureteral edema, and moderate hydroureteronephrosis. A 3 mm calcification at the distal right ureter/UVJ may represent a ureteral stone or a phlebolith. Findings are consistent with obstructive urolithiasis if this is a stone; alternatively, findings could be due to a recently passed stone or an ascending UTI. 2. Mild bladder thickening, likely due to chronic bladder outlet obstruction related to the enlarged prostate with disproportionate median lobe hypertrophy. 3. Mild anterior wedging of the L2 vertebral body with increased patchy sclerosis, possibly representing a healing subacute compression fracture. Underlying metastasis is not excluded. Follow-up as indicated,  and bone scintigraphy or MRI without and with contrast may be helpful. 4. Mild edema in the body wall and mesentery with minimal societies. Electronically signed by: Francis Quam MD 08/26/2024 01:20 AM EST RP Workstation: [id number]           LOS: 1 day   Time spent= 35 mins    Burgess JAYSON Dare, MD [facility]  If 7PM-7AM, please contact night-coverage  08/27/2024, 11:29 AM  "

## 2024-08-27 NOTE — Progress Notes (Signed)
 Irrigated bladder, retrieved 50cc of clot, CBI on and clear. Examined abdomen and soft though some new tenderness. Will order CT cystogram and make NPO with plan of taking to the OR tomorrow.

## 2024-08-27 NOTE — Hospital Course (Addendum)
 SABRA

## 2024-08-27 NOTE — Plan of Care (Signed)

## 2024-08-27 NOTE — Progress Notes (Signed)
 1 Day Post-Op Subjective: Doing well no fever chills N/V. No abdominal pain overnight. R sided pain as improved. Still having issues with GH. Had to be irrigated 3 times overnight   Objective: Vital signs in last 24 hours: Temp:  [97.4 F (36.3 C)-100 F (37.8 C)] 98 F (36.7 C) (12/24 0624) Pulse Rate:  [27-87] 83 (12/24 0624) Resp:  [10-20] 18 (12/24 0624) BP: (105-204)/(66-97) 172/92 (12/24 0624) SpO2:  [95 %-100 %] 100 % (12/24 0624) Weight:  [69.3 kg] 69.3 kg (12/23 1625)  Intake/Output from previous day: 12/23 0701 - 12/24 0700 In: 18970.5 [I.V.:2332.3; IV Piggyback:88.2] Out: 61349 [Urine:38650] Intake/Output this shift: No intake/output data recorded.  Physical Exam:  General: Alert and oriented CV: RRR Lungs: Clear Abdomen: Soft, ND, ATTP;  GU: dark cool-aid urine on CBI  Lab Results: Recent Labs    08/25/24 1648 08/26/24 0547 08/27/24 0513  HGB 10.2* 9.3* 9.8*  HCT 30.6* 28.3* 29.4*   BMET Recent Labs    08/26/24 0547 08/27/24 0513  NA 143 145  K 3.6 4.0  CL 111 115*  CO2 21* 19*  GLUCOSE 88 110*  BUN 50* 48*  CREATININE 5.55* 5.26*  CALCIUM  9.1 9.2     Studies/Results: DG C-Arm 1-60 Min-No Report Result Date: 08/26/2024 Fluoroscopy was utilized by the requesting physician.  No radiographic interpretation.   DG C-Arm 1-60 Min-No Report Result Date: 08/26/2024 Fluoroscopy was utilized by the requesting physician.  No radiographic interpretation.   CT ABDOMEN PELVIS WO CONTRAST Result Date: 08/26/2024 EXAM: CT ABDOMEN AND PELVIS WITHOUT CONTRAST 08/26/2024 12:20:20 AM TECHNIQUE: CT of the abdomen and pelvis was performed without the administration of intravenous contrast. Multiplanar reformatted images are provided for review. Automated exposure control, iterative reconstruction, and/or weight-based adjustment of the mA/kV was utilized to reduce the radiation dose to as low as reasonably achievable. COMPARISON: None available. CLINICAL  HISTORY: Abdominal/flank pain, stone suspected. Right lower quadrant pain with onset last night with nausea and vomiting. FINDINGS: LOWER CHEST: The lung bases are clear of infiltrates. There is mild elevation of the right hemidiaphragm. The heart is slightly enlarged. There is linear scarring or atelectasis in both lower lobes. LIVER: There are calcified granulomas in the left lobe of the liver with no other focal abnormality of the unenhanced liver. GALLBLADDER AND BILE DUCTS: There is layering low attenuation which could be sludge or noncalcified stones in the gallbladder without wall thickening or biliary dilatation. SPLEEN: No acute abnormality. PANCREAS: No acute abnormality. ADRENAL GLANDS: No acute abnormality. KIDNEYS, URETERS AND BLADDER: There is no contour deforming mass of the unenhanced kidneys. There is a Bosniak 1 cyst posteriorly in the left kidney measuring 1.3 cm and 16 HU units. Per consensus, no follow-up is needed for simple Bosniak type 1 and 2 renal cysts, unless the patient has a malignancy history or risk factors. On the right, there is both renal and perirenal edema, peripelvic and periureteral stranding edema, and moderate hydroureteronephrosis. There is a 3 mm calcification unable to be excluded from the plane of the distal right ureter / UVJ on series 2 axial 63, but this may be a phlebolith because it does not have a surrounding cuff of soft tissue. If this is a ureteral stone the findings are consistent with obstructive urolithiasis. If there is no ureteral stone, then the findings could be due to a recently passed stone or an ascending UTI. There are no calyceal stones in either kidney. No intravesical stone is seen. No other suggestion of ureteral  stones. There is mild bladder thickening, which is probably due to chronic bladder outlet obstruction related to the enlarged prostate. The prostate measures up to 6.7 cm in diameter with a prominent impression into the inferior bladder by  disproportionate median lobe hypertrophy. There are multiple pelvic phleboliths. GI AND BOWEL: Stomach demonstrates no acute abnormality. There is no bowel obstruction or inflammation. The appendix is normal caliber. PERITONEUM AND RETROPERITONEUM: There is trace pelvic ascites. No free air. Slight mesenteric congestion. VASCULATURE: Aorta is normal in caliber. There is heavy aortic and branch vessel atherosclerosis without aneurysm. LYMPH NODES: No lymphadenopathy. REPRODUCTIVE ORGANS: The prostate measures up to 6.7 cm in diameter with a prominent impression into the inferior bladder by disproportionate median lobe hypertrophy. BONES AND SOFT TISSUES: There is mild anterior wedging of the L2 vertebral body with increased patchy sclerosis in the L2 body. This could be a healing response such as due to a subacute compression fracture. Underlying metastasis is not excluded. Follow-up as indicated. Bone Scintigraphy or MRI without and with contrast may be helpful. There are bridging osteophytes over the anterior right SI joint. Mild edema in the body wall is seen. No focal soft tissue abnormality. IMPRESSION: 1. Right renal and perirenal edema, peripelvic and periureteral edema, and moderate hydroureteronephrosis. A 3 mm calcification at the distal right ureter/UVJ may represent a ureteral stone or a phlebolith. Findings are consistent with obstructive urolithiasis if this is a stone; alternatively, findings could be due to a recently passed stone or an ascending UTI. 2. Mild bladder thickening, likely due to chronic bladder outlet obstruction related to the enlarged prostate with disproportionate median lobe hypertrophy. 3. Mild anterior wedging of the L2 vertebral body with increased patchy sclerosis, possibly representing a healing subacute compression fracture. Underlying metastasis is not excluded. Follow-up as indicated, and bone scintigraphy or MRI without and with contrast may be helpful. 4. Mild edema in the  body wall and mesentery with minimal societies. Electronically signed by: Francis Quam MD 08/26/2024 01:20 AM EST RP Workstation: [id number]    Assessment/Plan: 13 M w/ CKD V and R distal ureteral stone and BPH. Initially did not want dialysis now dows come in w/ AKI and severe pain. Taken to OR for R URS on 12/24. Due to very large prostate had to resect some of the median lobe to access R UO. Stone was removed and stent was placed. Due to [facility] cathter was placed and the pt was placed on CBI. The stent has been tied to the catheter.   # R nephrolithiasis  - R stent in place - may be rmeoved when catheter is removed exchanged,  - stent should remain in place at least 3-4 days   # Gross heamturia - Pt irrigated today  - significant clot returned - will keep on CBI if no improvement over the day will get CT cysto gram and consider returning to the OR tomorrow  - transfuse as needed - recommend daily CBCs   LOS: 1 day   Jackey Pea MD 08/27/2024, 7:47 AM [facility]

## 2024-08-27 NOTE — Evaluation (Addendum)
 Physical Therapy Evaluation Patient Details Name: Cody Silva MRN: [id number] DOB: 1945/11/30 Today's Date: 08/27/2024  History of Present Illness  78 y.o. male with presenting with complaints of severe right sided abdominal/flank pain and vomiting. Patient was found to have BPH with obstructive uropathy/nephropathy/AKI. Plan cystoscopy by urology on 12/23 with laser lithotripsy, pyelogram, TURP, stone removal and stent placement. Following this still having hematuria. 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  Pt is mod I household distances, uses cane or furniture surfs at baseline. Lives with spouse and dtr who assist with dressing and iADLs.  Pt is agreeable only to bed level activity at this time, declines OOB despite encouragement. Recommend HHPT at this time given pt baseline and family assist at home. No family present at time of eval. Will update recommendations as indicated.  Pt currently with functional limitations due to the deficits listed below (see PT Problem List). Pt will benefit from acute skilled PT to increase their independence and safety with mobility to allow discharge.           If plan is discharge home, recommend the following: A little help with walking and/or transfers;A little help with bathing/dressing/bathroom;Help with stairs or ramp for entrance;Assist for transportation;Assistance with cooking/housework   Can travel by private vehicle        Equipment Recommendations None recommended by PT  Recommendations for Other Services       Functional Status Assessment Patient has had a recent decline in their functional status and demonstrates the ability to make significant  improvements in function in a reasonable and predictable amount of time.     Precautions / Restrictions Precautions Precautions: Fall Restrictions Weight Bearing Restrictions Per Provider Order: No      Mobility  Bed Mobility Overal bed mobility: Needs Assistance Bed Mobility: Rolling Rolling: Min assist, Mod assist         General bed mobility comments: pt able to perform partial roll R and L then becomes mildly agitated and declines further activity/OOB    Transfers                        Ambulation/Gait                  Stairs            Wheelchair Mobility     Tilt Bed    Modified Rankin (Stroke Patients Only)       Balance                                             Pertinent Vitals/Pain Pain Assessment Pain Assessment: No/denies pain    Home Living Family/patient expects to be discharged to:: Private residence Living Arrangements: Spouse/significant other;Children (dtr) Available Help at Discharge: Family;Available 24 hours/day Type of Home: House Home Access: Stairs to enter Entrance Stairs-Rails: None Entrance Stairs-Number of Steps: 2   Home Layout: Two level Home Equipment: Cane - single Librarian, Academic (2 wheels);Shower seat      Prior Function Prior Level of Function : Independent/Modified Independent;History of Falls (last six months)  Mobility Comments: mod I with SPC or furniture walking; 1 fall in last 6 mos ADLs Comments: ind with bathing, family assist with dressing.     Extremity/Trunk Assessment   Upper Extremity Assessment Upper Extremity Assessment: Generalized weakness;Defer to OT evaluation    Lower Extremity Assessment Lower Extremity Assessment: Generalized weakness       Communication   Communication Communication: No apparent difficulties    Cognition Arousal: Alert Behavior During Therapy: WFL for tasks assessed/performed   PT - Cognitive  impairments: No family/caregiver present to determine baseline                         Following commands: Intact       Cueing Cueing Techniques: Verbal cues     General Comments      Exercises     Assessment/Plan    PT Assessment Patient needs continued PT services  PT Problem List Decreased strength;Decreased mobility;Decreased activity tolerance;Decreased balance       PT Treatment Interventions DME instruction;Therapeutic activities;Gait training;Functional mobility training;Therapeutic exercise;Patient/family education;Balance training    PT Goals (Current goals can be found in the Care Plan section)  Acute Rehab PT Goals Patient Stated Goal: none stated PT Goal Formulation: With patient Time For Goal Achievement: 09/10/24 Potential to Achieve Goals: Fair    Frequency Min 3X/week     Co-evaluation               AM-PAC PT 6 Clicks Mobility  Outcome Measure Help needed turning from your back to your side while in a flat bed without using bedrails?: A Lot Help needed moving from lying on your back to sitting on the side of a flat bed without using bedrails?: A Lot Help needed moving to and from a bed to a chair (including a wheelchair)?: A Lot Help needed standing up from a chair using your arms (e.g., wheelchair or bedside chair)?: A Lot Help needed to walk in hospital room?: A Lot Help needed climbing 3-5 steps with a railing? : A Lot 6 Click Score: 12    End of Session   Activity Tolerance: Patient limited by fatigue Patient left: in bed;with call bell/phone within reach;with bed alarm set   PT Visit Diagnosis: Other abnormalities of gait and mobility (R26.89);Muscle weakness (generalized) (M62.81)    Time: 8469-8451 PT Time Calculation (min) (ACUTE ONLY): 18 min   Charges:   PT Evaluation $PT Eval Low Complexity: 1 Low             Aron Inge, PT  Acute Rehab Dept ([facility]) [phone]  08/27/2024   [facility] 08/27/2024,  5:10 PM

## 2024-08-27 NOTE — Progress Notes (Signed)
 Irrigated Pt x2, tolerated well, will continue with plan of care.

## 2024-08-28 ENCOUNTER — Inpatient Hospital Stay (HOSPITAL_COMMUNITY)

## 2024-08-28 ENCOUNTER — Encounter (HOSPITAL_COMMUNITY)
Admission: EM | Disposition: A | Discharge status: Skilled Nursing Facility | Payer: Self-pay | Source: Home / Self Care | Attending: Family Medicine

## 2024-08-28 ENCOUNTER — Inpatient Hospital Stay (HOSPITAL_COMMUNITY): Admitting: Certified Registered Nurse Anesthetist

## 2024-08-28 DIAGNOSIS — I120 Hypertensive chronic kidney disease with stage 5 chronic kidney disease or end stage renal disease: Secondary | ICD-10-CM | POA: Diagnosis not present

## 2024-08-28 DIAGNOSIS — E1122 Type 2 diabetes mellitus with diabetic chronic kidney disease: Secondary | ICD-10-CM | POA: Diagnosis not present

## 2024-08-28 DIAGNOSIS — N185 Chronic kidney disease, stage 5: Secondary | ICD-10-CM | POA: Diagnosis not present

## 2024-08-28 DIAGNOSIS — N200 Calculus of kidney: Secondary | ICD-10-CM | POA: Diagnosis not present

## 2024-08-28 DIAGNOSIS — R310 Gross hematuria: Secondary | ICD-10-CM | POA: Diagnosis not present

## 2024-08-28 HISTORY — PX: TRANSURETHRAL RESECTION OF PROSTATE: SHX73

## 2024-08-28 HISTORY — PX: CYSTOSCOPY W/ URETERAL STENT PLACEMENT: SHX1429

## 2024-08-28 HISTORY — PX: CYSTOSCOPY WITH FULGERATION: SHX6638

## 2024-08-28 LAB — CBC WITH DIFFERENTIAL/PLATELET
Abs Immature Granulocytes: 0.16 K/uL — ABNORMAL HIGH (ref 0.00–0.07)
Abs Immature Granulocytes: 0.06 K/uL (ref 0.00–0.07)
Basophils Absolute: 0 K/uL (ref 0.0–0.1)
Basophils Absolute: 0 K/uL (ref 0.0–0.1)
Basophils Relative: 0 %
Basophils Relative: 0 %
Eosinophils Absolute: 0 K/uL (ref 0.0–0.5)
Eosinophils Absolute: 0 K/uL (ref 0.0–0.5)
Eosinophils Relative: 0 %
Eosinophils Relative: 0 %
HCT: 19.9 % — ABNORMAL LOW (ref 39.0–52.0)
HCT: 31.1 % — ABNORMAL LOW (ref 39.0–52.0)
Hemoglobin: 6.6 g/dL — CL (ref 13.0–17.0)
Hemoglobin: 10.3 g/dL — ABNORMAL LOW (ref 13.0–17.0)
Immature Granulocytes: 2 %
Immature Granulocytes: 0 %
Lymphocytes Relative: 9 %
Lymphocytes Relative: 5 %
Lymphs Abs: 0.9 K/uL (ref 0.7–4.0)
Lymphs Abs: 0.7 K/uL (ref 0.7–4.0)
MCH: 31.1 pg (ref 26.0–34.0)
MCH: 30.1 pg (ref 26.0–34.0)
MCHC: 33.2 g/dL (ref 30.0–36.0)
MCHC: 33.1 g/dL (ref 30.0–36.0)
MCV: 93.9 fL (ref 80.0–100.0)
MCV: 90.9 fL (ref 80.0–100.0)
Monocytes Absolute: 0.4 K/uL (ref 0.1–1.0)
Monocytes Absolute: 0.2 K/uL (ref 0.1–1.0)
Monocytes Relative: 4 %
Monocytes Relative: 2 %
Neutro Abs: 8.7 K/uL — ABNORMAL HIGH (ref 1.7–7.7)
Neutro Abs: 13.7 K/uL — ABNORMAL HIGH (ref 1.7–7.7)
Neutrophils Relative %: 85 %
Neutrophils Relative %: 93 %
Platelets: 114 K/uL — ABNORMAL LOW (ref 150–400)
Platelets: 105 K/uL — ABNORMAL LOW (ref 150–400)
RBC: 2.12 MIL/uL — ABNORMAL LOW (ref 4.22–5.81)
RBC: 3.42 MIL/uL — ABNORMAL LOW (ref 4.22–5.81)
RDW: 14.2 % (ref 11.5–15.5)
RDW: 15.3 % (ref 11.5–15.5)
WBC: 10.1 K/uL (ref 4.0–10.5)
WBC: 14.7 K/uL — ABNORMAL HIGH (ref 4.0–10.5)
nRBC: 0 % (ref 0.0–0.2)
nRBC: 0 % (ref 0.0–0.2)

## 2024-08-28 LAB — BASIC METABOLIC PANEL WITH GFR
Anion gap: 13 (ref 5–15)
BUN: 48 mg/dL — ABNORMAL HIGH (ref 8–23)
CO2: 17 mmol/L — ABNORMAL LOW (ref 22–32)
Calcium: 9 mg/dL (ref 8.9–10.3)
Chloride: 115 mmol/L — ABNORMAL HIGH (ref 98–111)
Creatinine, Ser: 5.51 mg/dL — ABNORMAL HIGH (ref 0.61–1.24)
GFR, Estimated: 10 mL/min — ABNORMAL LOW
Glucose, Bld: 110 mg/dL — ABNORMAL HIGH (ref 70–99)
Potassium: 3.9 mmol/L (ref 3.5–5.1)
Sodium: 145 mmol/L (ref 135–145)

## 2024-08-28 LAB — PREPARE RBC (CROSSMATCH)

## 2024-08-28 LAB — ABO/RH: ABO/RH(D): O POS

## 2024-08-28 LAB — GLUCOSE, CAPILLARY: Glucose-Capillary: 97 mg/dL (ref 70–99)

## 2024-08-28 LAB — MAGNESIUM: Magnesium: 1.9 mg/dL (ref 1.7–2.4)

## 2024-08-28 LAB — PHOSPHORUS: Phosphorus: 4.8 mg/dL — ABNORMAL HIGH (ref 2.5–4.6)

## 2024-08-28 SURGERY — TURP (TRANSURETHRAL RESECTION OF PROSTATE)
Anesthesia: General

## 2024-08-28 MED ORDER — SODIUM CHLORIDE 0.9 % IV SOLN
INTRAVENOUS | Status: AC
  Filled 2024-08-28: qty 10

## 2024-08-28 MED ORDER — STERILE WATER FOR IRRIGATION IR SOLN
Status: DC | PRN
  Administered 2024-08-28: 1000 mL

## 2024-08-28 MED ORDER — ROCURONIUM BROMIDE 10 MG/ML (PF) SYRINGE
PREFILLED_SYRINGE | INTRAVENOUS | Status: AC
  Filled 2024-08-28: qty 10

## 2024-08-28 MED ORDER — PHENYLEPHRINE HCL-NACL 20-0.9 MG/250ML-% IV SOLN
INTRAVENOUS | Status: DC | PRN
  Administered 2024-08-28: 75 ug/min via INTRAVENOUS

## 2024-08-28 MED ORDER — FENTANYL CITRATE (PF) 100 MCG/2ML IJ SOLN
INTRAMUSCULAR | Status: DC | PRN
  Administered 2024-08-28 (×2): 50 ug via INTRAVENOUS

## 2024-08-28 MED ORDER — SODIUM CHLORIDE 0.9 % IV SOLN: INTRAVENOUS | Status: DC | PRN

## 2024-08-28 MED ORDER — PROPOFOL 10 MG/ML IV BOLUS
INTRAVENOUS | Status: AC
  Filled 2024-08-28: qty 20

## 2024-08-28 MED ORDER — SODIUM CHLORIDE 0.9% IV SOLUTION: Freq: Once | INTRAVENOUS | Status: AC

## 2024-08-28 MED ORDER — PHENYLEPHRINE 80 MCG/ML (10ML) SYRINGE FOR IV PUSH (FOR BLOOD PRESSURE SUPPORT)
PREFILLED_SYRINGE | INTRAVENOUS | Status: DC | PRN
  Administered 2024-08-28 (×2): 160 ug via INTRAVENOUS

## 2024-08-28 MED ORDER — SUGAMMADEX SODIUM 200 MG/2ML IV SOLN
INTRAVENOUS | Status: DC | PRN
  Administered 2024-08-28: 200 mg via INTRAVENOUS

## 2024-08-28 MED ORDER — DEXTROSE 5 % IV SOLN
INTRAVENOUS | Status: DC | PRN
  Administered 2024-08-28: 2 g via INTRAVENOUS

## 2024-08-28 MED ORDER — HYDROMORPHONE HCL 2 MG/ML IJ SOLN
INTRAMUSCULAR | Status: AC
  Filled 2024-08-28: qty 1

## 2024-08-28 MED ORDER — FENTANYL CITRATE (PF) 50 MCG/ML IJ SOSY: 25.0000 ug | PREFILLED_SYRINGE | INTRAMUSCULAR | Status: DC | PRN

## 2024-08-28 MED ORDER — FENTANYL CITRATE (PF) 100 MCG/2ML IJ SOLN
INTRAMUSCULAR | Status: AC
  Filled 2024-08-28: qty 2

## 2024-08-28 MED ORDER — SUGAMMADEX SODIUM 200 MG/2ML IV SOLN
INTRAVENOUS | Status: AC
  Filled 2024-08-28: qty 2

## 2024-08-28 MED ORDER — EPHEDRINE SULFATE-NACL 50-0.9 MG/10ML-% IV SOSY
PREFILLED_SYRINGE | INTRAVENOUS | Status: DC | PRN
  Administered 2024-08-28: 10 mg via INTRAVENOUS

## 2024-08-28 MED ORDER — HYDROMORPHONE HCL 1 MG/ML IJ SOLN
INTRAMUSCULAR | Status: DC | PRN
  Administered 2024-08-28 (×3): .5 mg via INTRAVENOUS

## 2024-08-28 MED ORDER — LACTATED RINGERS IV SOLN: INTRAVENOUS | Status: DC | PRN

## 2024-08-28 MED ORDER — ONDANSETRON HCL 4 MG/2ML IJ SOLN
INTRAMUSCULAR | Status: AC
  Filled 2024-08-28: qty 2

## 2024-08-28 MED ORDER — LIDOCAINE HCL (PF) 2 % IJ SOLN
INTRAMUSCULAR | Status: AC
  Filled 2024-08-28: qty 5

## 2024-08-28 MED ORDER — LIDOCAINE 2% (20 MG/ML) 5 ML SYRINGE
INTRAMUSCULAR | Status: DC | PRN
  Administered 2024-08-28: 60 mg via INTRAVENOUS

## 2024-08-28 MED ORDER — ROCURONIUM BROMIDE 10 MG/ML (PF) SYRINGE
PREFILLED_SYRINGE | INTRAVENOUS | Status: DC | PRN
  Administered 2024-08-28: 50 mg via INTRAVENOUS

## 2024-08-28 MED ORDER — ONDANSETRON HCL 4 MG/2ML IJ SOLN
INTRAMUSCULAR | Status: DC | PRN
  Administered 2024-08-28: 4 mg via INTRAVENOUS

## 2024-08-28 MED ORDER — DEXMEDETOMIDINE HCL IN NACL 80 MCG/20ML IV SOLN
INTRAVENOUS | Status: DC | PRN
  Administered 2024-08-28: 8 ug via INTRAVENOUS

## 2024-08-28 MED ORDER — IOHEXOL 300 MG/ML  SOLN
INTRAMUSCULAR | Status: DC | PRN
  Administered 2024-08-28: 25 mL

## 2024-08-28 MED ORDER — SODIUM CHLORIDE 0.9 % IR SOLN
Status: DC | PRN
  Administered 2024-08-28: 21000 mL via INTRAVESICAL

## 2024-08-28 MED ORDER — DEXAMETHASONE SOD PHOSPHATE PF 10 MG/ML IJ SOLN
INTRAMUSCULAR | Status: DC | PRN
  Administered 2024-08-28: 5 mg via INTRAVENOUS

## 2024-08-28 MED ORDER — SODIUM CHLORIDE 0.9% IV SOLUTION: Freq: Once | INTRAVENOUS | Status: DC

## 2024-08-28 MED ORDER — PROPOFOL 10 MG/ML IV BOLUS
INTRAVENOUS | Status: DC | PRN
  Administered 2024-08-28: 120 mg via INTRAVENOUS

## 2024-08-28 MED ORDER — SODIUM CHLORIDE 0.9 % IV SOLN
INTRAVENOUS | Status: AC
  Filled 2024-08-28: qty 20

## 2024-08-28 MED ORDER — SUCCINYLCHOLINE CHLORIDE 200 MG/10ML IV SOSY
PREFILLED_SYRINGE | INTRAVENOUS | Status: AC
  Filled 2024-08-28: qty 10

## 2024-08-28 SURGICAL SUPPLY — 25 items
BAG URINE DRAIN 2000ML AR STRL (UROLOGICAL SUPPLIES) ×3 IMPLANT
BAG URO CATCHER STRL LF (MISCELLANEOUS) ×3 IMPLANT
CATH FOLEY 3WAY 30CC 22FR (CATHETERS) IMPLANT
CATH HEMA 3WAY 30CC 22FR COUDE (CATHETERS) IMPLANT
CATH URETL OPEN 5X70 (CATHETERS) IMPLANT
CATH URETL OPEN END 6FR 70 (CATHETERS) IMPLANT
CATH URO 16X24FR 3W FL PS (CATHETERS) IMPLANT
CATH URTH STD 24FR FL 3W 2 (CATHETERS) IMPLANT
DRAPE FOOT SWITCH (DRAPES) ×3 IMPLANT
ELECT REM PT RETURN 15FT ADLT (MISCELLANEOUS) IMPLANT
EVACUATOR MICROVAS BLADDER (UROLOGICAL SUPPLIES) ×3 IMPLANT
GLOVE BIO SURGEON STRL SZ8 (GLOVE) ×3 IMPLANT
GOWN STRL REUS W/ TWL XL LVL3 (GOWN DISPOSABLE) ×3 IMPLANT
GUIDEWIRE STRT TIP .038X150X3 (WIRE) IMPLANT
HOLDER FOLEY CATH W/STRAP (MISCELLANEOUS) IMPLANT
KIT TURNOVER KIT A (KITS) ×3 IMPLANT
LOOP CUT BIPOLAR 24F LRG (ELECTROSURGICAL) ×3 IMPLANT
MANIFOLD NEPTUNE II (INSTRUMENTS) ×3 IMPLANT
PACK CYSTO (CUSTOM PROCEDURE TRAY) ×3 IMPLANT
PAD PREP 24X48 CUFFED NSTRL (MISCELLANEOUS) ×3 IMPLANT
STENT URET 6FRX26 CONTOUR (STENTS) IMPLANT
SYR 30ML LL (SYRINGE) ×3 IMPLANT
SYRINGE TOOMEY IRRIG 70ML (MISCELLANEOUS) IMPLANT
TUBING CONNECTING 10 (TUBING) ×3 IMPLANT
TUBING UROLOGY SET (TUBING) ×3 IMPLANT

## 2024-08-28 NOTE — Plan of Care (Signed)

## 2024-08-28 NOTE — Progress Notes (Signed)
 Irrigated pt x4. Tolerated well, will continue with plan of care.

## 2024-08-28 NOTE — Op Note (Signed)
 Operative note  Preoperative diagnosis: Gross hematuria clot retention and BPH, acute blood loss anemia  Postop diagnosis: Same  Procedures performed: Cystoscopy clot evacuation, TURP, left retrograde pyelogram, left ureteral stent placement  Operative findings: Right stent still in place Significant bleeding from the prostate median lobe, prostate median lobe resected down to the bladder floor Due to size of prostate and areas of bleeding there was significant bleeding proximal to the left UO decision was made to place a left ureteral stent. Patient hemostatic in the case. No other tumors or abnormalities in the bladder  Anesthesia: General  Antibiotics: Ceftriaxone   Fluids: Per anesthesia  EBL: 100 cc  RBC units given: 2  Drains: 22 French three-way 30 cc balloon  Specimens: Prostate chips  Implants: Existing 4.8 x 26 double-J stent in the right side, new 6 x 26 double-J stent in the left ureter both without strings.    Indication for procedure: 78 year old male with a history of CKD who initially did not want dialysis now has chosen to pursue dialysis if it comes to that.  He was in the ER with right flank pain and CT was concerning for right hydro as well as a distal right ureteral stone.  He was taken to the OR on Tuesday 12/23 there was found a small distal matrix stone in the ureter which was removed.  But due to the size the prostate part of the prostate had to be resected to get access to the ureteral orifice.  Postoperatively the patient had significant bleeding despite CBI he eventually developed acute blood loss anemia requiring transfusion decision was to take patient back to the OR today to resect the bleeding median lobe and stop the bleeding.  Procedure in detail: After informed consent was confirmed the preoperative patient in the operative suite placed under anesthesia by the anesthesia team he was then prepped and draped in usual sterile fashion time was performed  verifying correct patient procedure the patient began to receive 2 units of blood preoperatively.  First the 8 French rigid cystoscope was advanced into the bladder as much clot was irrigated out as possible.  Then we took time to resect the median lobe to identify the bleeding areas on the posterior aspect of the median lobe.  Once the median lobe had been resected the bleeding areas were cauterized.  Due to the large median lobe the edges extended within 1 to 2 mm of the ureteral orifice due to this resection caring so close to the ureteral orifice decision was made to place a stent in the left ureteral orifice along with the already existing stent in the right ureteral orifice.  Once the bleeding was controlled all the chips were removed as well as the remainder clot inspection of bladder was performed there were no irregularities there was some catheter irritation the dome of bladder but no evidence of perforation or other abnormalities.  Once the cauterized was under control this 26 French resectoscope was removed and a 22 French cystoscope was introduced in the bladder using a 0.38 sensor wire and a 5 French ureteral access sheath the guidewire wire was guided into the left ureter the ureter was very twisty and had a perpendicular angle to the urethra but we were able to get an to the ureter a 5 French ureteral access sheath was placed into the renal pelvis and then a retrograde pyelogram performed demonstrating correct placement as well as no contrast extravasation no filling defects.  The 6 x 26 double-J stent  was then placed over the wire into the collecting system from placement was confirmed with fluoroscopy.  We then reinspected the bladder and noted no bleeding and that all the chips were removed the scope was removed then as 22 French three-way catheter was placed in the bladder 30 cc were inserted in the balloon and the patient was placed on CBI.  Patient was extubated in the operative suite and  tolerated procedure well.  Disposition: Patient will remain on CBI overnight will discontinue CBI in the morning patient will follow-up in 2 weeks for stent removal in clinic.

## 2024-08-28 NOTE — Progress Notes (Signed)
 2 Days Post-Op Subjective: Pr having more pain than last night CT cystogram shows significant clot and Hct has decreased will plan to take to OR today. No N/V/F/C. Cath had to be irrigaed 4 times last night.   Objective: Vital signs in last 24 hours: Temp:  [98.3 F (36.8 C)-99 F (37.2 C)] 99 F (37.2 C) (12/25 0536) Pulse Rate:  [77-86] 82 (12/25 0536) Resp:  [16-20] 20 (12/25 0536) BP: (149-177)/(74-96) 151/77 (12/25 0536) SpO2:  [100 %] 100 % (12/25 0536)  Intake/Output from previous day: 12/24 0701 - 12/25 0700 In: 2702.4 [P.O.:120; I.V.:1482.4; IV Piggyback:100] Out: 53624 [Urine:46375] Intake/Output this shift: Total I/O In: -  Out: 1850 [Urine:1850]  Physical Exam:  General: Alert and oriented CV: RRR Lungs: Clear Abdomen: Soft, ND, some tenderness  GU: Dark red merlo colored cathete tubeing   Lab Results: Recent Labs    08/27/24 0513 08/27/24 1600 08/28/24 0517  HGB 9.8* 8.0* 6.6*  HCT 29.4* 24.3* 19.9*   BMET Recent Labs    08/27/24 0513 08/28/24 0517  NA 145 145  K 4.0 3.9  CL 115* 115*  CO2 19* 17*  GLUCOSE 110* 110*  BUN 48* 48*  CREATININE 5.26* 5.51*  CALCIUM  9.2 9.0     Studies/Results: CT CYSTOGRAM ABD/PELVIS Result Date: 08/27/2024 CLINICAL DATA:  Abdominal pain.  Recent cystoscopy stent placement. EXAM: CT CYSTOGRAM (CT ABDOMEN AND PELVIS WITH CONTRAST) TECHNIQUE: Multi-detector CT imaging through the abdomen and pelvis was performed after dilute contrast had been introduced into the bladder for the purposes of performing CT cystography. RADIATION DOSE REDUCTION: This exam was performed according to the departmental dose-optimization program which includes automated exposure control, adjustment of the mA and/or kV according to patient size and/or use of iterative reconstruction technique. CONTRAST:  50mL OMNIPAQUE  IOHEXOL  300 MG/ML  SOLN COMPARISON:  Noncontrast CT yesterday FINDINGS: Lower chest: Increased atelectasis in the lung bases.  Trace left pleural fluid. Decreased density of the blood pool suggestive of anemia. Hepatobiliary: Calcified granuloma again seen in the left lobe of the liver. Layering density in the gallbladder may represent stones or sludge. No gallbladder wall thickening. Common bile duct is poorly assessed on the current exam. Pancreas: Parenchymal atrophy. No ductal dilatation or inflammation. Spleen: Normal in size without focal abnormality. Adrenals/Urinary Tract: No adrenal nodule. Placement of right nephroureteral stent with proximal pigtail in the renal pelvis and distal pigtail in the bladder. No significant change in right hydronephrosis. Calcification posterior to the bladder that was question distal ureteral calculus is tentatively visualized as phlebolith outside the course of the ureter, series 2, image 57. Contrast instilled into the urinary bladder. There is no evidence of contrast extravasation. Foley catheter in place. There is a rounded density in the inferior bladder surrounding the Foley balloon measuring approximately 6.3 x 6 x 5.4 cm. This is higher density than the adjacent prostate. This is surrounded by instilled contrast medial and laterally, but no contrast cleft is seen centrally. This is favored to represent blood clot rather than prostatic enlargement. Stomach/Bowel: Limited assessment on the current exam due to lack of contrast and motion. No bowel obstruction or inflammation. Normal appendix. Vascular/Lymphatic: Aorto bi-iliac atherosclerosis. There is no bulky lymphadenopathy. Reproductive: Prostate gland is enlarged spanning 5.5 cm transverse. Rounded density in the bladder higher density than the adjacent prostate, favored to represent blood clot rather than prostatic mass effect. Other: Generalized subcutaneous edema. Mild edema of the intra-abdominal fat. No significant ascites. No free air. Musculoskeletal: Increased sclerosis within the  L2 vertebral body with mild anterior wedging is  unchanged from yesterday's exam. No acute osseous findings. IMPRESSION: 1. Placement of right nephroureteral stent with proximal pigtail in the renal pelvis and distal pigtail in the bladder. No significant change in right hydronephrosis. The right pelvic calcification question to be ureteral is tentatively visualized outside the course of the ureter on the current exam. 2. Rounded density in the inferior bladder surrounding the Foley balloon measuring approximately 6.3 x 6 x 5.4 cm. This is higher density than the adjacent prostate, favored to represent blood clot rather than prostatic enlargement. 3. No evidence of contrast extravasation from the urinary bladder. 4. Increased sclerosis within the L2 vertebral body with mild anterior wedging is unchanged from yesterday's exam. 5. Increased atelectasis in the lung bases with trace left pleural fluid. Aortic Atherosclerosis (ICD10-I70.0). Electronically Signed   By: Andrea Gasman M.D.   On: 08/27/2024 19:38   DG C-Arm 1-60 Min-No Report Result Date: 08/26/2024 Fluoroscopy was utilized by the requesting physician.  No radiographic interpretation.   DG C-Arm 1-60 Min-No Report Result Date: 08/26/2024 Fluoroscopy was utilized by the requesting physician.  No radiographic interpretation.    Assessment/Plan: 4 M w/ CKD V and R distal ureteral stone and BPH. Initially did not want dialysis now dows come in w/ AKI and severe pain. Taken to OR for R URS on 12/24. Due to very large prostate had to resect some of the median lobe to access R UO. Stone was removed and stent was placed. Due to [facility] cathter was placed and the pt was placed on CBI. The stent has been tied to the catheter.     # R nephrolithiasis  - R stent in place - may be rmeoved when catheter is removed exchanged,  - stent should remain in place at least 3-4 days    # Gross heamturia - Pt irrigated today  - Hct down to 6.6 was stabel yester day AM  - tranfuse 2 units  - plan for cysto  ct evacuation and TURP of median lobe - CT cystogram demonstrates a large bladder clot no contrast extravasation   We discussed risk benefits alternatives to the procedure including bleeding infection demonstrating structures including the bladder urethra and ureteral orifices possible need for stent possible need for bladder closure.  We discussed the need for possible blood.  We discussed the goal of surgery was to eliminate bleeding.  Patient voiced understanding consent was obtained.   LOS: 2 days   Jackey Pea MD 08/28/2024, 8:55 AM [facility]

## 2024-08-28 NOTE — Plan of Care (Signed)
  Problem: Education: Goal: Knowledge of General Education information will improve Description: Including pain rating scale, medication(s)/side effects and non-pharmacologic comfort measures Outcome: Progressing   Problem: Clinical Measurements: Goal: Ability to maintain clinical measurements within normal limits will improve Outcome: Progressing   Problem: Elimination: Goal: Will not experience complications related to urinary retention Outcome: Progressing   

## 2024-08-28 NOTE — Transfer of Care (Signed)
 Immediate Anesthesia Transfer of Care Note  Patient: Cody Silva  Procedure(s) Performed: TURP (TRANSURETHRAL RESECTION OF PROSTATE) CYSTOSCOPY, WITH RETROGRADE PYELOGRAM AND URETERAL STENT INSERTION (Left)  Patient Location: PACU  Anesthesia Type:General  Level of Consciousness: drowsy  Airway & Oxygen Therapy: Patient Spontanous Breathing and Patient connected to face mask oxygen  Post-op Assessment: Report given to RN and Post -op Vital signs reviewed and stable  Post vital signs: Reviewed and stable  Last Vitals:  Vitals Value Taken Time  BP 145/80 08/28/24 11:06  Temp    Pulse 85 08/28/24 11:10  Resp 12 08/28/24 11:10  SpO2 100 % 08/28/24 11:10  Vitals shown include unfiled device data.  Last Pain:  Vitals:   08/27/24 2120  TempSrc:   PainSc: Asleep         Complications: No notable events documented.

## 2024-08-28 NOTE — Anesthesia Preprocedure Evaluation (Addendum)
"                                    Anesthesia Evaluation  Patient identified by MRN, date of birth, ID band Patient awake    Reviewed: Allergy & Precautions, H&P , NPO status , Patient's Chart, lab work & pertinent test results  Airway Mallampati: II  TM Distance: >3 FB Neck ROM: Full    Dental no notable dental hx. (+) Partial Lower, Partial Upper, Dental Advisory Given   Pulmonary sleep apnea    Pulmonary exam normal breath sounds clear to auscultation       Cardiovascular hypertension, Pt. on medications  Rhythm:Regular Rate:Normal     Neuro/Psych  Headaches  Anxiety Depression    CVA    GI/Hepatic negative GI ROS, Neg liver ROS,,,  Endo/Other  diabetes, Type 2    Renal/GU Renal disease  negative genitourinary   Musculoskeletal  (+) Arthritis , Osteoarthritis,    Abdominal   Peds  Hematology  (+) Blood dyscrasia, anemia   Anesthesia Other Findings   Reproductive/Obstetrics negative OB ROS                              Anesthesia Physical Anesthesia Plan  ASA: 3  Anesthesia Plan: General   Post-op Pain Management: Ofirmev  IV (intra-op)*   Induction: Intravenous  PONV Risk Score and Plan: 3 and Ondansetron , Dexamethasone  and Treatment may vary due to age or medical condition  Airway Management Planned: Oral ETT  Additional Equipment:   Intra-op Plan:   Post-operative Plan: Extubation in OR  Informed Consent: I have reviewed the patients History and Physical, chart, labs and discussed the procedure including the risks, benefits and alternatives for the proposed anesthesia with the patient or authorized representative who has indicated his/her understanding and acceptance.     Dental advisory given  Plan Discussed with: CRNA  Anesthesia Plan Comments:          Anesthesia Quick Evaluation  "

## 2024-08-28 NOTE — Progress Notes (Signed)
 " PROGRESS NOTE    Cody Silva  FMW:[id number] DOB: 04-Apr-1946 DOA: 08/25/2024 PCP: [facility]    Brief Narrative:   78 y.o. male with medical history significant of CVA, brain impairment, CKD stage V has previously declined dialysis, type 2 diabetes, hypertension, ascending thoracic aortic aneurysm, depression, gout, OSA, orthostatic hypotension, recurrent syncope, pancytopenia, blindness presenting with complaints of severe right sided abdominal/flank pain and vomiting.  Symptoms started over 24 hours ago.  Patient was found to have BPH with obstructive uropathy/nephropathy/AKI.  Plan cystoscopy by urology on 12/23 with laser lithotripsy, pyelogram, TURP, stone removal and stent placement.  Following this still having hematuria.  Continuing CBI  Assessment & Plan:    Obstructive urolithiasis AKI on CKD stage V Patient was found to have BPH with obstructive uropathy/nephropathy/AKI. cystoscopy by urology on 12/23 with laser lithotripsy, pyelogram, TURP, stone removal and stent placement.  Following this still having hematuria.  Continuing CBI Continue fluids.  Due to persistent hematuria, repeat cystoscopy today - Continue CBI, empiric Rocephin  - Baseline creatinine 4.5, admission creatinine 5.5   Chronic normocytic anemia Acute blood loss anemia Hemoglobin lower than 7, ordered 2 units today Baseline hemoglobin around 9.0   Hypertensive urgency Improved but still elevated.  Continue Norvasc  and clonidine , will increase IV as needed   Diet controlled diabetes Hemoglobin A1c 4.5 in July 2025.   History of CVA Holding Plavix  while having hematuria - Continue statin      Mild chronic thrombocytopenia Platelet count stable, monitor labs.   Depression Continue Lexapro .   Chronic pain/neuropathy Continue gabapentin .   Incidental finding of subacute lumbar compression fracture on CT CT showing mild anterior wedging of the L2 vertebral body with increased  patchy sclerosis, possibly representing healing subacute compression fracture but underlying metastasis is not excluded.  Patient is not endorsing lumbar back pain at this time and no falls reported.  Radiologist recommending bone scintigraphy or MRI.  Patient will need close outpatient follow-up with PCP for further workup.  DVT prophylaxis: SCDs Start: 08/26/24 0511      Code Status: Full Code Family Communication:   Status is: Inpatient Remains inpatient appropriate because: Ongoing management for hematuria   PT Follow up Recs:   Subjective: Hematuria with abdominal pain.  Plans for OR.   Examination:  General exam: Appears calm and comfortable  Respiratory system: Clear to auscultation. Respiratory effort normal. Cardiovascular system: S1 & S2 heard, RRR. No JVD, murmurs, rubs, gallops or clicks. No pedal edema. Gastrointestinal system: Abdomen is nondistended, soft and nontender. No organomegaly or masses felt. Normal bowel sounds heard. Central nervous system: Alert and oriented. No focal neurological deficits. Extremities: Symmetric 5 x 5 power. Skin: No rashes, lesions or ulcers Psychiatry: Judgement and insight appear normal. Mood & affect appropriate. Catheter in place for CBI               Diet Orders (From admission, onward)     Start     Ordered   08/27/24 1720  Diet NPO time specified  Diet effective now        08/27/24 1719            Objective: Vitals:   08/27/24 1637 08/27/24 2109 08/28/24 0536 08/28/24 0851  BP: (!) 149/74 (!) 177/78 (!) 151/77 (!) 160/80  Pulse: 77 86 82 79  Resp: 16 20 20 20   Temp: 98.7 F (37.1 C) 98.9 F (37.2 C) 99 F (37.2 C) 99.2 F (37.3 C)  TempSrc: Oral  SpO2: 100% 100% 100% 99%  Weight:      Height:        Intake/Output Summary (Last 24 hours) at 08/28/2024 1035 Last data filed at 08/28/2024 1007 Gross per 24 hour  Intake 3452.43 ml  Output 61724 ml  Net -34822.57 ml   Filed Weights    08/25/24 1642 08/26/24 1625  Weight: 68.9 kg 69.3 kg    Scheduled Meds:  [MAR Hold] sodium chloride    Intravenous Once   [MAR Hold] sodium chloride    Intravenous Once   [MAR Hold] amLODipine   10 mg Oral QHS   [MAR Hold] atorvastatin   40 mg Oral QHS   [MAR Hold] calcitRIOL   0.25 mcg Oral Daily   [MAR Hold] Chlorhexidine  Gluconate Cloth  6 each Topical Daily   cloNIDine   0.1 mg Transdermal Once   [MAR Hold] dorzolamide -timolol   1 drop Both Eyes BID   [MAR Hold] escitalopram   10 mg Oral QHS   [MAR Hold] ferrous sulfate   325 mg Oral Q M,W,F   [MAR Hold] gabapentin   300 mg Oral QHS   [MAR Hold] latanoprost   1 drop Both Eyes QHS   Continuous Infusions:  [MAR Hold] cefTRIAXone  (ROCEPHIN )  IV 1 g (08/27/24 0833)   sodium chloride  irrigation      Nutritional status     Body mass index is 18.6 kg/m.  Data Reviewed:   CBC: Recent Labs  Lab 08/25/24 1648 08/26/24 0547 08/27/24 0513 08/27/24 1600 08/28/24 0517  WBC 4.9 4.9 13.3*  --  10.1  NEUTROABS  --   --  11.5*  --  8.7*  HGB 10.2* 9.3* 9.8* 8.0* 6.6*  HCT 30.6* 28.3* 29.4* 24.3* 19.9*  MCV 91.1 93.7 93.6  --  93.9  PLT 140* 123* 129*  --  114*   Basic Metabolic Panel: Recent Labs  Lab 08/25/24 1648 08/26/24 0547 08/27/24 0513 08/28/24 0517  NA 142 143 145 145  K 3.7 3.6 4.0 3.9  CL 107 111 115* 115*  CO2 22 21* 19* 17*  GLUCOSE 100* 88 110* 110*  BUN 50* 50* 48* 48*  CREATININE 5.57* 5.55* 5.26* 5.51*  CALCIUM  9.6 9.1 9.2 9.0  MG  --   --   --  1.9  PHOS  --   --   --  4.8*   GFR: Estimated Creatinine Clearance: 11 mL/min (A) (by C-G formula based on SCr of 5.51 mg/dL (H)). Liver Function Tests: Recent Labs  Lab 08/25/24 1648  AST 16  ALT 6  ALKPHOS 68  BILITOT 0.6  PROT 7.0  ALBUMIN 4.2   Recent Labs  Lab 08/25/24 1648  LIPASE 25   No results for input(s): AMMONIA in the last 168 hours. Coagulation Profile: No results for input(s): INR, PROTIME in the last 168 hours. Cardiac  Enzymes: No results for input(s): CKTOTAL, CKMB, CKMBINDEX, TROPONINI in the last 168 hours. BNP (last 3 results) No results for input(s): PROBNP in the last 8760 hours. HbA1C: No results for input(s): HGBA1C in the last 72 hours. CBG: Recent Labs  Lab 08/26/24 0947 08/26/24 1210 08/26/24 1245  GLUCAP 82 76 127*   Lipid Profile: No results for input(s): CHOL, HDL, LDLCALC, TRIG, CHOLHDL, LDLDIRECT in the last 72 hours. Thyroid  Function Tests: No results for input(s): TSH, T4TOTAL, FREET4, T3FREE, THYROIDAB in the last 72 hours. Anemia Panel: No results for input(s): VITAMINB12, FOLATE, FERRITIN, TIBC, IRON, RETICCTPCT in the last 72 hours. Sepsis Labs: Recent Labs  Lab 08/26/24 0840 08/26/24 1703  LATICACIDVEN 0.8  1.0    Recent Results (from the past 240 hours)  Urine Culture (for pregnant, neutropenic or urologic patients or patients with an indwelling urinary catheter)     Status: None   Collection Time: 08/26/24  8:40 AM   Specimen: Urine, Clean Catch  Result Value Ref Range Status   Specimen Description URINE, CLEAN CATCH  Final   Special Requests NONE  Final   Culture   Final    NO GROWTH Performed at [facility], [address] [place], [place] [address]    Report Status 08/27/2024 FINAL  Final         Radiology Studies: CT CYSTOGRAM ABD/PELVIS Result Date: 08/27/2024 CLINICAL DATA:  Abdominal pain.  Recent cystoscopy stent placement. EXAM: CT CYSTOGRAM (CT ABDOMEN AND PELVIS WITH CONTRAST) TECHNIQUE: Multi-detector CT imaging through the abdomen and pelvis was performed after dilute contrast had been introduced into the bladder for the purposes of performing CT cystography. RADIATION DOSE REDUCTION: This exam was performed according to the departmental dose-optimization program which includes automated exposure control, adjustment of the mA and/or kV according to patient size and/or use of iterative  reconstruction technique. CONTRAST:  50mL OMNIPAQUE  IOHEXOL  300 MG/ML  SOLN COMPARISON:  Noncontrast CT yesterday FINDINGS: Lower chest: Increased atelectasis in the lung bases. Trace left pleural fluid. Decreased density of the blood pool suggestive of anemia. Hepatobiliary: Calcified granuloma again seen in the left lobe of the liver. Layering density in the gallbladder may represent stones or sludge. No gallbladder wall thickening. Common bile duct is poorly assessed on the current exam. Pancreas: Parenchymal atrophy. No ductal dilatation or inflammation. Spleen: Normal in size without focal abnormality. Adrenals/Urinary Tract: No adrenal nodule. Placement of right nephroureteral stent with proximal pigtail in the renal pelvis and distal pigtail in the bladder. No significant change in right hydronephrosis. Calcification posterior to the bladder that was question distal ureteral calculus is tentatively visualized as phlebolith outside the course of the ureter, series 2, image 57. Contrast instilled into the urinary bladder. There is no evidence of contrast extravasation. Foley catheter in place. There is a rounded density in the inferior bladder surrounding the Foley balloon measuring approximately 6.3 x 6 x 5.4 cm. This is higher density than the adjacent prostate. This is surrounded by instilled contrast medial and laterally, but no contrast cleft is seen centrally. This is favored to represent blood clot rather than prostatic enlargement. Stomach/Bowel: Limited assessment on the current exam due to lack of contrast and motion. No bowel obstruction or inflammation. Normal appendix. Vascular/Lymphatic: Aorto bi-iliac atherosclerosis. There is no bulky lymphadenopathy. Reproductive: Prostate gland is enlarged spanning 5.5 cm transverse. Rounded density in the bladder higher density than the adjacent prostate, favored to represent blood clot rather than prostatic mass effect. Other: Generalized subcutaneous edema.  Mild edema of the intra-abdominal fat. No significant ascites. No free air. Musculoskeletal: Increased sclerosis within the L2 vertebral body with mild anterior wedging is unchanged from yesterday's exam. No acute osseous findings. IMPRESSION: 1. Placement of right nephroureteral stent with proximal pigtail in the renal pelvis and distal pigtail in the bladder. No significant change in right hydronephrosis. The right pelvic calcification question to be ureteral is tentatively visualized outside the course of the ureter on the current exam. 2. Rounded density in the inferior bladder surrounding the Foley balloon measuring approximately 6.3 x 6 x 5.4 cm. This is higher density than the adjacent prostate, favored to represent blood clot rather than prostatic enlargement. 3. No evidence of contrast  extravasation from the urinary bladder. 4. Increased sclerosis within the L2 vertebral body with mild anterior wedging is unchanged from yesterday's exam. 5. Increased atelectasis in the lung bases with trace left pleural fluid. Aortic Atherosclerosis (ICD10-I70.0). Electronically Signed   By: Andrea Gasman M.D.   On: 08/27/2024 19:38   DG C-Arm 1-60 Min-No Report Result Date: 08/26/2024 Fluoroscopy was utilized by the requesting physician.  No radiographic interpretation.   DG C-Arm 1-60 Min-No Report Result Date: 08/26/2024 Fluoroscopy was utilized by the requesting physician.  No radiographic interpretation.           LOS: 2 days   Time spent= 35 mins    Burgess JAYSON Dare, MD [facility]  If 7PM-7AM, please contact night-coverage  08/28/2024, 10:35 AM  "

## 2024-08-28 NOTE — Progress Notes (Signed)
 HH ref faxed, offers pending.

## 2024-08-28 NOTE — Plan of Care (Signed)

## 2024-08-28 NOTE — Anesthesia Procedure Notes (Signed)
 Procedure Name: Intubation Date/Time: 08/28/2024 9:35 AM  Performed by: Mannie Krystal LABOR, CRNAPre-anesthesia Checklist: Patient identified, Emergency Drugs available, Suction available and Patient being monitored Patient Re-evaluated:Patient Re-evaluated prior to induction Oxygen Delivery Method: Circle system utilized Preoxygenation: Pre-oxygenation with 100% oxygen Induction Type: IV induction Ventilation: Mask ventilation without difficulty Laryngoscope Size: Mac and 4 Grade View: Grade II Tube type: Oral Tube size: 7.0 mm Number of attempts: 1 Airway Equipment and Method: Stylet and Oral airway Placement Confirmation: ETT inserted through vocal cords under direct vision, positive ETCO2 and breath sounds checked- equal and bilateral Secured at: 24 cm Tube secured with: Tape Dental Injury: Teeth and Oropharynx as per pre-operative assessment

## 2024-08-29 ENCOUNTER — Encounter (HOSPITAL_COMMUNITY): Payer: Self-pay | Admitting: Urology

## 2024-08-29 DIAGNOSIS — N211 Calculus in urethra: Secondary | ICD-10-CM | POA: Diagnosis not present

## 2024-08-29 LAB — BASIC METABOLIC PANEL WITH GFR
Anion gap: 14 (ref 5–15)
BUN: 63 mg/dL — ABNORMAL HIGH (ref 8–23)
CO2: 18 mmol/L — ABNORMAL LOW (ref 22–32)
Calcium: 9.1 mg/dL (ref 8.9–10.3)
Chloride: 114 mmol/L — ABNORMAL HIGH (ref 98–111)
Creatinine, Ser: 5.8 mg/dL — ABNORMAL HIGH (ref 0.61–1.24)
GFR, Estimated: 9 mL/min — ABNORMAL LOW
Glucose, Bld: 111 mg/dL — ABNORMAL HIGH (ref 70–99)
Potassium: 4.4 mmol/L (ref 3.5–5.1)
Sodium: 145 mmol/L (ref 135–145)

## 2024-08-29 LAB — CBC WITH DIFFERENTIAL/PLATELET
Abs Immature Granulocytes: 0.03 K/uL (ref 0.00–0.07)
Basophils Absolute: 0 K/uL (ref 0.0–0.1)
Basophils Relative: 0 %
Eosinophils Absolute: 0 K/uL (ref 0.0–0.5)
Eosinophils Relative: 0 %
HCT: 26.3 % — ABNORMAL LOW (ref 39.0–52.0)
Hemoglobin: 8.7 g/dL — ABNORMAL LOW (ref 13.0–17.0)
Immature Granulocytes: 0 %
Lymphocytes Relative: 7 %
Lymphs Abs: 0.8 K/uL (ref 0.7–4.0)
MCH: 29.8 pg (ref 26.0–34.0)
MCHC: 33.1 g/dL (ref 30.0–36.0)
MCV: 90.1 fL (ref 80.0–100.0)
Monocytes Absolute: 0.8 K/uL (ref 0.1–1.0)
Monocytes Relative: 7 %
Neutro Abs: 10.6 K/uL — ABNORMAL HIGH (ref 1.7–7.7)
Neutrophils Relative %: 86 %
Platelets: 121 K/uL — ABNORMAL LOW (ref 150–400)
RBC: 2.92 MIL/uL — ABNORMAL LOW (ref 4.22–5.81)
RDW: 15.2 % (ref 11.5–15.5)
WBC: 12.3 K/uL — ABNORMAL HIGH (ref 4.0–10.5)
nRBC: 0 % (ref 0.0–0.2)

## 2024-08-29 NOTE — Progress Notes (Signed)
 Physical Therapy Treatment Patient Details Name: Cody Silva MRN: [id number] DOB: [birth year] Today's Date: 08/29/2024   History of Present Illness 78 y.o. male with presenting with complaints of severe right sided abdominal/flank pain and vomiting. Patient was found to have BPH with obstructive uropathy/nephropathy/AKI. Plan cystoscopy by urology on 12/23 with laser lithotripsy, pyelogram, TURP, stone removal and stent placement. Following this still having hematuria. PMH significant of CVA with R sided deficits, brain impairment, CKD stage V has previously declined dialysis, type 2 diabetes, hypertension, ascending thoracic aortic aneurysm, depression, gout, OSA, orthostatic hypotension, recurrent syncope, pancytopenia, blindness    PT Comments  Pt very pleasant during session. Requires clear cues due to blindness, and also has lots of urthra pain especially with any movement. From OT session , pt with great amount of Left lateral lean, it was present during our PT session as well, but was able to function slightly better. At this time, he is keeping his head turned and laterally leaning to the left ( family states this is not normal).  He is able to turn his head to the right, but only with commands and not aware his head is not aligned at this time. I assessed with UE and LEs and no R versus Left differences noted at this time.  Family concerned with this new lateral head turn and lean. Seems to be better now than it was earlier this morning. Will continue to observe during our sessions.  At this time feel patient will benefit from continued inpatient follow up therapy, <3 hours/day.      If plan is discharge home, recommend the following: A little help with bathing/dressing/bathroom;Help with stairs or ramp for entrance;Assist for transportation;Assistance with cooking/housework;A lot of help with walking and/or transfers;A lot of help with bathing/dressing/bathroom   Can travel by  private vehicle     No (may be able to prgress to be able to be transported by vehicle, will contnue to update if possible)  Equipment Recommendations  None recommended by PT (pt's family states he has a RW)    Recommendations for Other Services       Precautions / Restrictions       Mobility  Bed Mobility Overal bed mobility: Needs Assistance Bed Mobility: Sit to Supine       Sit to supine: Mod assist, +2 for physical assistance   General bed mobility comments: sitting EOB with supervision at this time. however required upper body and lower body assist to supine due to pain when performing.    Transfers Overall transfer level: Needs assistance Equipment used: Rolling walker (2 wheels) Transfers: Sit to/from Stand Sit to Stand: Min assist           General transfer comment: Min A to boost with chair pad due to low chair height compared to patients height. Pt was favoring and leaning to the left but it was not inferring with his ability to statically stand with contact gurad to minA    Ambulation/Gait Ambulation/Gait assistance: Min assist Gait Distance (Feet): 10 Feet (in room) Assistive device: Rolling walker (2 wheels)         General Gait Details: pt needed guidance with RW due to blindness, and was still with head tilt to left, but not requiring physical assistance for centering   Stairs             Wheelchair Mobility     Tilt Bed    Modified Rankin (Stroke Patients Only)  Balance Overall balance assessment: Needs assistance Sitting-balance support: Feet supported Sitting balance-Leahy Scale: Fair   Postural control: Left lateral lean (slight) Standing balance support: During functional activity, Bilateral upper extremity supported, Reliant on assistive device for balance Standing balance-Leahy Scale: Poor                              Communication Communication Communication: No apparent difficulties  Cognition  Arousal: Alert Behavior During Therapy: WFL for tasks assessed/performed                           PT - Cognition Comments: pt able to easily follow commands and instructions, some tactile cures necessary due to pt being blind Following commands: Intact      Cueing Cueing Techniques: Verbal cues, Tactile cues  Exercises      General Comments General comments (skin integrity, edema, etc.): no skin issues, poor activity tolerance      Pertinent Vitals/Pain Pain Assessment Pain Assessment: Faces Faces Pain Scale: Hurts even more (lots of pain in uretal area with any moment) Pain Location: urethal area Pain Descriptors / Indicators: Shooting, Sharp, Burning Pain Intervention(s): Limited activity within patient's tolerance, Monitored during session    Home Living Family/patient expects to be discharged to:: Private residence Living Arrangements: Spouse/significant other;Children (dtr) Available Help at Discharge: Family;Available 24 hours/day Type of Home: House Home Access: Stairs to enter Entrance Stairs-Rails: None Entrance Stairs-Number of Steps: 2   Home Layout: Two level Home Equipment: Cane - single Librarian, Academic (2 wheels);Shower seat Additional Comments: 1 fall 3 months ago from pt reportedly passing out.    Prior Function            PT Goals (current goals can now be found in the care plan section) Acute Rehab PT Goals Patient Stated Goal: I want to be able to go home safe. Family stated they want him to be able to move better in order to care for him at home. PT Goal Formulation: With patient Time For Goal Achievement: 09/10/24 Potential to Achieve Goals: Fair Progress towards PT goals: Progressing toward goals    Frequency    Min 3X/week      PT Plan      Co-evaluation              AM-PAC PT 6 Clicks Mobility   Outcome Measure  Help needed turning from your back to your side while in a flat bed without using bedrails?: A  Lot Help needed moving from lying on your back to sitting on the side of a flat bed without using bedrails?: A Lot Help needed moving to and from a bed to a chair (including a wheelchair)?: A Lot Help needed standing up from a chair using your arms (e.g., wheelchair or bedside chair)?: A Lot Help needed to walk in hospital room?: A Lot Help needed climbing 3-5 steps with a railing? : A Lot 6 Click Score: 12    End of Session Equipment Utilized During Treatment: Gait belt Activity Tolerance: Patient limited by fatigue Patient left: in bed;with call bell/phone within reach;with bed alarm set (put tape on callbell, however still unable to push for nurse, so request soft touch by nurse secretary for this patient)   PT Visit Diagnosis: Other abnormalities of gait and mobility (R26.89);Muscle weakness (generalized) (M62.81)     Time: 8880-8849 PT Time Calculation (min) (ACUTE ONLY): 31 min  Charges:    $Gait Training: 8-22 mins $Therapeutic Activity: 8-22 mins PT General Charges $$ ACUTE PT VISIT: 1 Visit                     Esvin Hnat, PT, MPT Acute Rehabilitation Services Office: [phone] If a weekend: secure chat groups: [facility] PT, [facility] OT, [facility] SLP 08/29/2024    Jeri Rawlins 08/29/2024, 12:15 PM

## 2024-08-29 NOTE — Progress Notes (Addendum)
 " PROGRESS NOTE    Cody Silva  FMW:[id number] DOB: [birth year] DOA: 08/25/2024 PCP: [facility]    Brief Narrative:   78 y.o. male with medical history significant of CVA, brain impairment, CKD stage V has previously declined dialysis, type 2 diabetes, hypertension, ascending thoracic aortic aneurysm, depression, gout, OSA, orthostatic hypotension, recurrent syncope, pancytopenia, blindness presenting with complaints of severe right sided abdominal/flank pain and vomiting.  Symptoms started over 24 hours ago.  Patient was found to have BPH with obstructive uropathy/nephropathy/AKI.  Plan cystoscopy by urology on 12/23 with laser lithotripsy, pyelogram, TURP, stone removal and stent placement.  Following this still having hematuria.  Continuing CBI  Assessment & Plan:    Obstructive urolithiasis AKI on CKD stage V Patient was found to have BPH with obstructive uropathy/nephropathy/AKI. cystoscopy by urology on 12/23 with laser lithotripsy, pyelogram, TURP, stone removal and stent placement.  But hematuria persisted despite of CBI therefore repeat cystoscopy with clot evacuation, TURP, left sided retrograde pyelogram was performed on 12/25. - Plans to continue CBI - Baseline creatinine 4.5, admission creatinine 5.5 -Eventually stent removal in 2 weeks   Chronic normocytic anemia Acute blood loss anemia Hemoglobin lower than 7, required 2 units of PRBC, hemoglobin 8.7 this morning   Hypertensive urgency, improved Essential hypertension Improved but still elevated.  Continue Norvasc  and clonidine , will increase IV as needed   Diet controlled diabetes Hemoglobin A1c 4.5 in July 2025.   History of CVA Plavix  remains on hold due to hematuria - Continue Lipitor  Addendum Spoke with Spouse, she has noted patient have some issues with spatial awareness and has known hx of CVA.  Will get MRI Brain  Mild chronic thrombocytopenia Platelet count stable, monitor labs.    Depression Continue Lexapro .   Chronic pain/neuropathy Continue gabapentin .   Incidental finding of subacute lumbar compression fracture on CT CT showing mild anterior wedging of the L2 vertebral body with increased patchy sclerosis, possibly representing healing subacute compression fracture but underlying metastasis is not excluded.  Patient is not endorsing lumbar back pain at this time and no falls reported.  Radiologist recommending bone scintigraphy or MRI.  Patient will need close outpatient follow-up with PCP for further workup.  DVT prophylaxis: SCDs Start: 08/26/24 0511      Code Status: Full Code Family Communication:   Status is: Inpatient Remains inpatient appropriate because: Ongoing management for hematuria   PT Follow up Recs:   Subjective: Seen at bedside no complaints Minimal/very light pink blood in the bag noted   Examination:  General exam: Appears calm and comfortable  Respiratory system: Clear to auscultation. Respiratory effort normal. Cardiovascular system: S1 & S2 heard, RRR. No JVD, murmurs, rubs, gallops or clicks. No pedal edema. Gastrointestinal system: Abdomen is nondistended, soft and nontender. No organomegaly or masses felt. Normal bowel sounds heard. Central nervous system: Alert and oriented. No focal neurological deficits. Extremities: Symmetric 5 x 5 power. Skin: No rashes, lesions or ulcers Psychiatry: Judgement and insight appear normal. Mood & affect appropriate. Catheter in place for CBI               Diet Orders (From admission, onward)     Start     Ordered   08/28/24 1108  Diet renal/carb modified with fluid restriction Diet-HS Snack? Nothing; Fluid restriction: 1200 mL Fluid; Room service appropriate? Yes; Fluid consistency: Thin  Diet effective now       Question Answer Comment  Diet-HS Snack? Nothing   Fluid restriction: 1200  mL Fluid   Room service appropriate? Yes   Fluid consistency: Thin      08/28/24 1107             Objective: Vitals:   08/29/24 0104 08/29/24 0552 08/29/24 1004 08/29/24 1424  BP: (!) 156/73 (!) 152/72 (!) 151/84 134/67  Pulse: 79 86 83 75  Resp: 17 16 18 16   Temp: 98.9 F (37.2 C) 99.2 F (37.3 C) 97.9 F (36.6 C) 98 F (36.7 C)  TempSrc: Oral Oral Oral Oral  SpO2: 100% 97% 100% 100%  Weight:      Height:        Intake/Output Summary (Last 24 hours) at 08/29/2024 1516 Last data filed at 08/29/2024 0900 Gross per 24 hour  Intake 3600 ml  Output 8200 ml  Net -4600 ml   Filed Weights   08/25/24 1642 08/26/24 1625  Weight: 68.9 kg 69.3 kg    Scheduled Meds:  sodium chloride    Intravenous Once   amLODipine   10 mg Oral QHS   atorvastatin   40 mg Oral QHS   calcitRIOL   0.25 mcg Oral Daily   Chlorhexidine  Gluconate Cloth  6 each Topical Daily   cloNIDine   0.1 mg Transdermal Once   dorzolamide -timolol   1 drop Both Eyes BID   escitalopram   10 mg Oral QHS   ferrous sulfate   325 mg Oral Q M,W,F   gabapentin   300 mg Oral QHS   latanoprost   1 drop Both Eyes QHS   Continuous Infusions:  cefTRIAXone  (ROCEPHIN )  IV 1 g (08/29/24 9166)   sodium chloride  irrigation      Nutritional status     Body mass index is 18.6 kg/m.  Data Reviewed:   CBC: Recent Labs  Lab 08/26/24 0547 08/27/24 0513 08/27/24 1600 08/28/24 0517 08/28/24 1743 08/29/24 0443  WBC 4.9 13.3*  --  10.1 14.7* 12.3*  NEUTROABS  --  11.5*  --  8.7* 13.7* 10.6*  HGB 9.3* 9.8* 8.0* 6.6* 10.3* 8.7*  HCT 28.3* 29.4* 24.3* 19.9* 31.1* 26.3*  MCV 93.7 93.6  --  93.9 90.9 90.1  PLT 123* 129*  --  114* 105* 121*   Basic Metabolic Panel: Recent Labs  Lab 08/25/24 1648 08/26/24 0547 08/27/24 0513 08/28/24 0517 08/29/24 0443  NA 142 143 145 145 145  K 3.7 3.6 4.0 3.9 4.4  CL 107 111 115* 115* 114*  CO2 22 21* 19* 17* 18*  GLUCOSE 100* 88 110* 110* 111*  BUN 50* 50* 48* 48* 63*  CREATININE 5.57* 5.55* 5.26* 5.51* 5.80*  CALCIUM  9.6 9.1 9.2 9.0 9.1  MG  --   --   --  1.9  --    PHOS  --   --   --  4.8*  --    GFR: Estimated Creatinine Clearance: 10.5 mL/min (A) (by C-G formula based on SCr of 5.8 mg/dL (H)). Liver Function Tests: Recent Labs  Lab 08/25/24 1648  AST 16  ALT 6  ALKPHOS 68  BILITOT 0.6  PROT 7.0  ALBUMIN 4.2   Recent Labs  Lab 08/25/24 1648  LIPASE 25   No results for input(s): AMMONIA in the last 168 hours. Coagulation Profile: No results for input(s): INR, PROTIME in the last 168 hours. Cardiac Enzymes: No results for input(s): CKTOTAL, CKMB, CKMBINDEX, TROPONINI in the last 168 hours. BNP (last 3 results) No results for input(s): PROBNP in the last 8760 hours. HbA1C: No results for input(s): HGBA1C in the last 72 hours. CBG: Recent Labs  Lab 08/26/24 0947 08/26/24 1210 08/26/24 1245 08/28/24 1109  GLUCAP 82 76 127* 97   Lipid Profile: No results for input(s): CHOL, HDL, LDLCALC, TRIG, CHOLHDL, LDLDIRECT in the last 72 hours. Thyroid  Function Tests: No results for input(s): TSH, T4TOTAL, FREET4, T3FREE, THYROIDAB in the last 72 hours. Anemia Panel: No results for input(s): VITAMINB12, FOLATE, FERRITIN, TIBC, IRON, RETICCTPCT in the last 72 hours. Sepsis Labs: Recent Labs  Lab 08/26/24 0840 08/26/24 1703  LATICACIDVEN 0.8 1.0    Recent Results (from the past 240 hours)  Urine Culture (for pregnant, neutropenic or urologic patients or patients with an indwelling urinary catheter)     Status: None   Collection Time: 08/26/24  8:40 AM   Specimen: Urine, Clean Catch  Result Value Ref Range Status   Specimen Description URINE, CLEAN CATCH  Final   Special Requests NONE  Final   Culture   Final    NO GROWTH Performed at [facility], [address] [place], [place] [address]    Report Status 08/27/2024 FINAL  Final         Radiology Studies: DG C-Arm 1-60 Min-No Report Result Date: 08/28/2024 Fluoroscopy was utilized by the requesting physician.  No  radiographic interpretation.   CT CYSTOGRAM ABD/PELVIS Result Date: 08/27/2024 CLINICAL DATA:  Abdominal pain.  Recent cystoscopy stent placement. EXAM: CT CYSTOGRAM (CT ABDOMEN AND PELVIS WITH CONTRAST) TECHNIQUE: Multi-detector CT imaging through the abdomen and pelvis was performed after dilute contrast had been introduced into the bladder for the purposes of performing CT cystography. RADIATION DOSE REDUCTION: This exam was performed according to the departmental dose-optimization program which includes automated exposure control, adjustment of the mA and/or kV according to patient size and/or use of iterative reconstruction technique. CONTRAST:  50mL OMNIPAQUE  IOHEXOL  300 MG/ML  SOLN COMPARISON:  Noncontrast CT yesterday FINDINGS: Lower chest: Increased atelectasis in the lung bases. Trace left pleural fluid. Decreased density of the blood pool suggestive of anemia. Hepatobiliary: Calcified granuloma again seen in the left lobe of the liver. Layering density in the gallbladder may represent stones or sludge. No gallbladder wall thickening. Common bile duct is poorly assessed on the current exam. Pancreas: Parenchymal atrophy. No ductal dilatation or inflammation. Spleen: Normal in size without focal abnormality. Adrenals/Urinary Tract: No adrenal nodule. Placement of right nephroureteral stent with proximal pigtail in the renal pelvis and distal pigtail in the bladder. No significant change in right hydronephrosis. Calcification posterior to the bladder that was question distal ureteral calculus is tentatively visualized as phlebolith outside the course of the ureter, series 2, image 57. Contrast instilled into the urinary bladder. There is no evidence of contrast extravasation. Foley catheter in place. There is a rounded density in the inferior bladder surrounding the Foley balloon measuring approximately 6.3 x 6 x 5.4 cm. This is higher density than the adjacent prostate. This is surrounded by instilled  contrast medial and laterally, but no contrast cleft is seen centrally. This is favored to represent blood clot rather than prostatic enlargement. Stomach/Bowel: Limited assessment on the current exam due to lack of contrast and motion. No bowel obstruction or inflammation. Normal appendix. Vascular/Lymphatic: Aorto bi-iliac atherosclerosis. There is no bulky lymphadenopathy. Reproductive: Prostate gland is enlarged spanning 5.5 cm transverse. Rounded density in the bladder higher density than the adjacent prostate, favored to represent blood clot rather than prostatic mass effect. Other: Generalized subcutaneous edema. Mild edema of the intra-abdominal fat. No significant ascites. No free air. Musculoskeletal: Increased sclerosis within the L2 vertebral  body with mild anterior wedging is unchanged from yesterday's exam. No acute osseous findings. IMPRESSION: 1. Placement of right nephroureteral stent with proximal pigtail in the renal pelvis and distal pigtail in the bladder. No significant change in right hydronephrosis. The right pelvic calcification question to be ureteral is tentatively visualized outside the course of the ureter on the current exam. 2. Rounded density in the inferior bladder surrounding the Foley balloon measuring approximately 6.3 x 6 x 5.4 cm. This is higher density than the adjacent prostate, favored to represent blood clot rather than prostatic enlargement. 3. No evidence of contrast extravasation from the urinary bladder. 4. Increased sclerosis within the L2 vertebral body with mild anterior wedging is unchanged from yesterday's exam. 5. Increased atelectasis in the lung bases with trace left pleural fluid. Aortic Atherosclerosis (ICD10-I70.0). Electronically Signed   By: Andrea Gasman M.D.   On: 08/27/2024 19:38           LOS: 3 days   Time spent= 35 mins    Burgess JAYSON Dare, MD [facility]  If 7PM-7AM, please contact night-coverage  08/29/2024, 3:16 PM  "

## 2024-08-29 NOTE — Anesthesia Postprocedure Evaluation (Signed)
"   Anesthesia Post Note  Patient: Cody Silva  Procedure(s) Performed: TURP (TRANSURETHRAL RESECTION OF PROSTATE) CYSTOSCOPY, WITH RETROGRADE PYELOGRAM AND URETERAL STENT INSERTION (Left) CYSTOSCOPY, WITH CLOT EVACUATION     Patient location during evaluation: PACU Anesthesia Type: General Level of consciousness: awake and alert Pain management: pain level controlled Vital Signs Assessment: post-procedure vital signs reviewed and stable Respiratory status: spontaneous breathing, nonlabored ventilation and respiratory function stable Cardiovascular status: blood pressure returned to baseline and stable Postop Assessment: no apparent nausea or vomiting Anesthetic complications: no   No notable events documented.             Katerin Negrete,W. EDMOND      "

## 2024-08-29 NOTE — Evaluation (Signed)
 Occupational Therapy Evaluation Patient Details Name: Cody Silva MRN: [id number] DOB: 1945/12/21 Today's Date: 08/29/2024   History of Present Illness   78 y.o. male with presenting with complaints of severe right sided abdominal/flank pain and vomiting. Patient was found to have BPH with obstructive uropathy/nephropathy/AKI. Plan cystoscopy by urology on 12/23 with laser lithotripsy, pyelogram, TURP, stone removal and stent placement. Following this still having hematuria. PMH significant of CVA with R sided deficits, brain impairment, CKD stage V has previously declined dialysis, type 2 diabetes, hypertension, ascending thoracic aortic aneurysm, depression, gout, OSA, orthostatic hypotension, recurrent syncope, pancytopenia, blindness     Clinical Impressions PTA, patient lives at home with spouse and was mod I including steps to enter home. Currently, patient presents with deficits outlined below (see OT Problem List for details) most significantly blindness at baseline, decreased posture, motor planning, generalized muscle weakness, exacerbated post CVA effects on balance, decreased activity tolerance and cognition limiting BADL's (max A-tot A LB) and functional mobility (+2 SPT max A) performance. Patient will benefit from continued inpatient follow up therapy, <3 hours/day. Patient requires continued Acute care hospital level OT services to progress safety and functional performance and allow for discharge.       If plan is discharge home, recommend the following:   Two people to help with walking and/or transfers;A lot of help with bathing/dressing/bathroom;Assistance with cooking/housework;Assistance with feeding;Direct supervision/assist for medications management;Direct supervision/assist for financial management;Assist for transportation;Help with stairs or ramp for entrance;Supervision due to cognitive status     Functional Status Assessment   Patient has had a recent  decline in their functional status and demonstrates the ability to make significant improvements in function in a reasonable and predictable amount of time.     Equipment Recommendations   Wheelchair (measurements OT);Wheelchair cushion (measurements OT);BSC/3in1;Tub/shower seat      Precautions/Restrictions   Precautions Precautions: Fall Restrictions Weight Bearing Restrictions Per Provider Order: No     Mobility Bed Mobility Overal bed mobility: Needs Assistance Bed Mobility: Rolling Rolling: Min assist, Mod assist, Used rails         General bed mobility comments: poor sitting balance EOB initially    Transfers Overall transfer level: Needs assistance Equipment used: Rolling walker (2 wheels) Transfers: Sit to/from Stand, Bed to chair/wheelchair/BSC Sit to Stand: Mod assist, Max assist, +2 physical assistance, +2 safety/equipment, From elevated surface     Step pivot transfers: +2 physical assistance, +2 safety/equipment, Mod assist, Max assist     General transfer comment: mod-max cues for directionality as patient pushing hard to L side      Balance Overall balance assessment: Needs assistance Sitting-balance support: Feet supported Sitting balance-Leahy Scale: Poor   Postural control: Left lateral lean Standing balance support: During functional activity, Bilateral upper extremity supported, Reliant on assistive device for balance Standing balance-Leahy Scale: Poor Standing balance comment: + 2 to remain upright with UE support                           ADL either performed or assessed with clinical judgement   ADL Overall ADL's : Needs assistance/impaired Eating/Feeding: Maximal assistance;Sitting;Cueing for sequencing;Cueing for safety Eating/Feeding Details (indicate cue type and reason): low vision strategies initiated Grooming: Wash/dry hands;Wash/dry face;Minimal assistance;Sitting;Cueing for sequencing   Upper Body Bathing:  Moderate assistance;Sitting;Cueing for sequencing   Lower Body Bathing: Total assistance;Bed level   Upper Body Dressing : Maximal assistance;Sitting;Cueing for sequencing   Lower Body Dressing: Total assistance;Bed level  Toilet Transfer: Maximal assistance;+2 for physical assistance;+2 for safety/equipment;Moderate assistance;BSC/3in1;Cueing for safety;Cueing for sequencing   Toileting- Clothing Manipulation and Hygiene: Total assistance;Sitting/lateral lean Toileting - Clothing Manipulation Details (indicate cue type and reason): Foley and bladder irrigation     Functional mobility during ADLs: Moderate assistance;Maximal assistance;Rolling walker (2 wheels);Cueing for safety;Cueing for sequencing General ADL Comments: step by step cues for blindness and R body orientation     Vision Baseline Vision/History: 2 Legally blind Ability to See in Adequate Light: 4 Severely impaired Patient Visual Report: No change from baseline       Perception Perception: Impaired       Praxis Praxis: Impaired Praxis Impairment Details: Motor planning, Organization Praxis-Other Comments: severe L trunk and orientation preference   Pertinent Vitals/Pain Pain Assessment Pain Assessment: No/denies pain     Extremity/Trunk Assessment Upper Extremity Assessment Upper Extremity Assessment: Generalized weakness;Right hand dominant   Lower Extremity Assessment Lower Extremity Assessment: Defer to PT evaluation   Cervical / Trunk Assessment Cervical / Trunk Assessment: Other exceptions (severe L lean and trunk preference)   Communication Communication Communication: No apparent difficulties   Cognition Arousal: Alert Behavior During Therapy: WFL for tasks assessed/performed Cognition: Cognition impaired   Orientation impairments: Time, Situation Awareness: Intellectual awareness impaired, Online awareness impaired Memory impairment (select all impairments): Short-term memory Attention  impairment (select first level of impairment): Sustained attention Executive functioning impairment (select all impairments): Initiation, Organization, Sequencing, Reasoning, Problem solving OT - Cognition Comments: slow processing, decreased insight and judgement                 Following commands: Intact       Cueing  General Comments   Cueing Techniques: Verbal cues  no skin issues, poor activity tolerance           Home Living Family/patient expects to be discharged to:: Private residence Living Arrangements: Spouse/significant other;Children (dtr) Available Help at Discharge: Family;Available 24 hours/day Type of Home: House Home Access: Stairs to enter [employer] of Steps: 2 Entrance Stairs-Rails: None Home Layout: Two level     Bathroom Shower/Tub: Producer, Television/film/video: Standard Bathroom Accessibility: Yes   Home Equipment: Cane - single Librarian, Academic (2 wheels);Shower seat   Additional Comments: 1 fall 3 months ago from pt reportedly passing out.      Prior Functioning/Environment Prior Level of Function : Independent/Modified Independent;History of Falls (last six months)             Mobility Comments: mod I with SPC or furniture walking; 1 fall in last 6 mos ADLs Comments: ind with bathing, family assist with dressing.    OT Problem List: Decreased strength;Decreased activity tolerance;Impaired balance (sitting and/or standing);Impaired vision/perception;Decreased coordination;Decreased cognition;Decreased safety awareness;Decreased knowledge of use of DME or AE;Decreased knowledge of precautions   OT Treatment/Interventions: Self-care/ADL training;Therapeutic exercise;Neuromuscular education;Energy conservation;DME and/or AE instruction;Therapeutic activities;Cognitive remediation/compensation;Patient/family education;Balance training      OT Goals(Current goals can be found in the care plan section)   Acute  Rehab OT Goals Patient Stated Goal: to feel better OT Goal Formulation: With patient Time For Goal Achievement: 09/12/24 Potential to Achieve Goals: Good ADL Goals Pt Will Perform Eating: with min assist;sitting Pt Will Perform Grooming: with contact guard assist;sitting Pt Will Perform Upper Body Bathing: with min assist;sitting Pt Will Perform Upper Body Dressing: with min assist;sitting Pt Will Transfer to Toilet: with min assist;with +2 assist;bedside commode;stand pivot transfer   OT Frequency:  Min 2X/week    Co-evaluation  AM-PAC OT 6 Clicks Daily Activity     Outcome Measure Help from another person eating meals?: A Lot Help from another person taking care of personal grooming?: A Lot Help from another person toileting, which includes using toliet, bedpan, or urinal?: Total Help from another person bathing (including washing, rinsing, drying)?: A Lot Help from another person to put on and taking off regular upper body clothing?: A Lot Help from another person to put on and taking off regular lower body clothing?: A Lot 6 Click Score: 11   End of Session Equipment Utilized During Treatment: Gait belt;Rolling walker (2 wheels) Nurse Communication: Mobility status  Activity Tolerance: Patient limited by fatigue Patient left: in chair;with call bell/phone within reach;with chair alarm set  OT Visit Diagnosis: Unsteadiness on feet (R26.81);Other abnormalities of gait and mobility (R26.89);Muscle weakness (generalized) (M62.81);Low vision, both eyes (H54.2);Cognitive communication deficit (R41.841)                Time: 9143-9070 OT Time Calculation (min): 33 min Charges:  OT General Charges $OT Visit: 1 Visit OT Evaluation $OT Eval Low Complexity: 1 Low OT Treatments $Self Care/Home Management : 8-22 mins  Zaray Gatchel OT/L Acute Rehabilitation Department  [phone]  08/29/2024, 10:38 AM

## 2024-08-29 NOTE — Progress Notes (Signed)
 "  Urology Inpatient Progress Report  Urolithiasis [N20.9] Right ureteral stone [N20.1] Acute kidney injury superimposed on chronic kidney disease [N17.9, N18.9] Procedures: TURP (TRANSURETHRAL RESECTION OF PROSTATE) CYSTOSCOPY, WITH RETROGRADE PYELOGRAM AND URETERAL STENT INSERTION CYSTOSCOPY, WITH CLOT EVACUATION 1 Day Post-Op  Intv/Subj: No acute events overnight. Patient is without complaint.  Principal Problem:   Urolithiasis Active Problems:   Diabetes mellitus (HCC)   Depression   AKI (acute kidney injury)   Hypertensive urgency  Current Facility-Administered Medications  Medication Dose Route Frequency Provider Last Rate Last Admin   0.9 %  sodium chloride  infusion (Manually program via Guardrails IV Fluids)   Intravenous Once Shane Steffan BROCKS, MD       acetaminophen  (TYLENOL ) tablet 650 mg  650 mg Oral Q6H PRN Rathore, Vasundhra, MD   650 mg at 08/29/24 9182   Or   acetaminophen  (TYLENOL ) suppository 650 mg  650 mg Rectal Q6H PRN Alfornia Madison, MD       amLODipine  (NORVASC ) tablet 10 mg  10 mg Oral QHS Amin, Ankit C, MD   10 mg at 08/28/24 2201   atorvastatin  (LIPITOR) tablet 40 mg  40 mg Oral QHS Rathore, Vasundhra, MD   40 mg at 08/28/24 2201   calcitRIOL  (ROCALTROL ) capsule 0.25 mcg  0.25 mcg Oral Daily Amin, Ankit C, MD   0.25 mcg at 08/29/24 0816   cefTRIAXone  (ROCEPHIN ) 1 g in sodium chloride  0.9 % 100 mL IVPB  1 g Intravenous Q24H Samtani, Jai-Gurmukh, MD 200 mL/hr at 08/29/24 0833 1 g at 08/29/24 9166   Chlorhexidine  Gluconate Cloth 2 % PADS 6 each  6 each Topical Daily Samtani, Jai-Gurmukh, MD   6 each at 08/29/24 0827   cloNIDine  (CATAPRES  - Dosed in mg/24 hr) patch 0.1 mg  0.1 mg Transdermal Once Griselda Norris, MD   0.1 mg at 08/26/24 0128   dorzolamide -timolol  (COSOPT ) 2-0.5 % ophthalmic solution 1 drop  1 drop Both Eyes BID Rathore, Vasundhra, MD   1 drop at 08/29/24 9182   escitalopram  (LEXAPRO ) tablet 10 mg  10 mg Oral QHS Rathore, Vasundhra, MD    10 mg at 08/28/24 2201   ferrous sulfate  tablet 325 mg  325 mg Oral Q M,W,F Amin, Ankit C, MD   325 mg at 08/29/24 9182   gabapentin  (NEURONTIN ) capsule 300 mg  300 mg Oral QHS Rathore, Vasundhra, MD   300 mg at 08/28/24 2201   glucagon  (human recombinant) (GLUCAGEN) injection 1 mg  1 mg Intravenous PRN Amin, Ankit C, MD       hydrALAZINE  (APRESOLINE ) injection 10 mg  10 mg Intravenous Q4H PRN Amin, Ankit C, MD   10 mg at 08/27/24 2120   HYDROmorphone  (DILAUDID ) injection 0.5 mg  0.5 mg Intravenous Q4H PRN Rathore, Vasundhra, MD   0.5 mg at 08/27/24 0106   ipratropium-albuterol  (DUONEB) 0.5-2.5 (3) MG/3ML nebulizer solution 3 mL  3 mL Nebulization Q4H PRN Amin, Ankit C, MD       latanoprost  (XALATAN ) 0.005 % ophthalmic solution 1 drop  1 drop Both Eyes QHS Alfornia Madison, MD   1 drop at 08/28/24 2201   metoprolol  tartrate (LOPRESSOR ) injection 5 mg  5 mg Intravenous Q4H PRN Amin, Ankit C, MD       naloxone  (NARCAN ) injection 0.4 mg  0.4 mg Intravenous PRN Rathore, Vasundhra, MD       ondansetron  (ZOFRAN ) injection 4 mg  4 mg Intravenous Q6H PRN Alfornia Madison, MD       sodium chloride  irrigation 0.9 %  3,000 mL  3,000 mL Irrigation Continuous Showalter, Victor C, MD   3,000 mL at 08/28/24 0202     Objective: Vital: Vitals:   08/28/24 2128 08/28/24 2258 08/29/24 0104 08/29/24 0552  BP: (!) 176/118 (!) 156/97 (!) 156/73 (!) 152/72  Pulse: 85 67 79 86  Resp: 17  17 16   Temp: 97.6 F (36.4 C)  98.9 F (37.2 C) 99.2 F (37.3 C)  TempSrc: Oral  Oral Oral  SpO2: 100%  100% 97%  Weight:      Height:       I/Os: I/O last 3 completed shifts: In: 5032.4 [P.O.:600; I.V.:2382.4; Blood:630; Other:1200; IV Piggyback:220] Out: 69149 [Urine:30850]  Physical Exam:  General: Patient is in no apparent distress Lungs: Normal respiratory effort, chest expands symmetrically. GI: Incisions are c/d/i. The abdomen is soft and nontender without mass. Foley: draining straw colored urine on a very  low CBI gtt  Ext: lower extremities symmetric  Lab Results: Recent Labs    08/28/24 0517 08/28/24 1743 08/29/24 0443  WBC 10.1 14.7* 12.3*  HGB 6.6* 10.3* 8.7*  HCT 19.9* 31.1* 26.3*   Recent Labs    08/27/24 0513 08/28/24 0517 08/29/24 0443  NA 145 145 145  K 4.0 3.9 4.4  CL 115* 115* 114*  CO2 19* 17* 18*  GLUCOSE 110* 110* 111*  BUN 48* 48* 63*  CREATININE 5.26* 5.51* 5.80*  CALCIUM  9.2 9.0 9.1   No results for input(s): LABPT, INR in the last 72 hours. No results for input(s): LABURIN in the last 72 hours. Results for orders placed or performed during the hospital encounter of 08/25/24  Urine Culture (for pregnant, neutropenic or urologic patients or patients with an indwelling urinary catheter)     Status: None   Collection Time: 08/26/24  8:40 AM   Specimen: Urine, Clean Catch  Result Value Ref Range Status   Specimen Description URINE, CLEAN CATCH  Final   Special Requests NONE  Final   Culture   Final    NO GROWTH Performed at [facility], [address], [place], [place] [address]    Report Status 08/27/2024 FINAL  Final    Studies/Results: DG C-Arm 1-60 Min-No Report Result Date: 08/28/2024 Fluoroscopy was utilized by the requesting physician.  No radiographic interpretation.   CT CYSTOGRAM ABD/PELVIS Result Date: 08/27/2024 CLINICAL DATA:  Abdominal pain.  Recent cystoscopy stent placement. EXAM: CT CYSTOGRAM (CT ABDOMEN AND PELVIS WITH CONTRAST) TECHNIQUE: Multi-detector CT imaging through the abdomen and pelvis was performed after dilute contrast had been introduced into the bladder for the purposes of performing CT cystography. RADIATION DOSE REDUCTION: This exam was performed according to the departmental dose-optimization program which includes automated exposure control, adjustment of the mA and/or kV according to patient size and/or use of iterative reconstruction technique. CONTRAST:  50mL OMNIPAQUE  IOHEXOL  300 MG/ML  SOLN COMPARISON:   Noncontrast CT yesterday FINDINGS: Lower chest: Increased atelectasis in the lung bases. Trace left pleural fluid. Decreased density of the blood pool suggestive of anemia. Hepatobiliary: Calcified granuloma again seen in the left lobe of the liver. Layering density in the gallbladder may represent stones or sludge. No gallbladder wall thickening. Common bile duct is poorly assessed on the current exam. Pancreas: Parenchymal atrophy. No ductal dilatation or inflammation. Spleen: Normal in size without focal abnormality. Adrenals/Urinary Tract: No adrenal nodule. Placement of right nephroureteral stent with proximal pigtail in the renal pelvis and distal pigtail in the bladder. No significant change in right hydronephrosis. Calcification posterior  to the bladder that was question distal ureteral calculus is tentatively visualized as phlebolith outside the course of the ureter, series 2, image 57. Contrast instilled into the urinary bladder. There is no evidence of contrast extravasation. Foley catheter in place. There is a rounded density in the inferior bladder surrounding the Foley balloon measuring approximately 6.3 x 6 x 5.4 cm. This is higher density than the adjacent prostate. This is surrounded by instilled contrast medial and laterally, but no contrast cleft is seen centrally. This is favored to represent blood clot rather than prostatic enlargement. Stomach/Bowel: Limited assessment on the current exam due to lack of contrast and motion. No bowel obstruction or inflammation. Normal appendix. Vascular/Lymphatic: Aorto bi-iliac atherosclerosis. There is no bulky lymphadenopathy. Reproductive: Prostate gland is enlarged spanning 5.5 cm transverse. Rounded density in the bladder higher density than the adjacent prostate, favored to represent blood clot rather than prostatic mass effect. Other: Generalized subcutaneous edema. Mild edema of the intra-abdominal fat. No significant ascites. No free air.  Musculoskeletal: Increased sclerosis within the L2 vertebral body with mild anterior wedging is unchanged from yesterday's exam. No acute osseous findings. IMPRESSION: 1. Placement of right nephroureteral stent with proximal pigtail in the renal pelvis and distal pigtail in the bladder. No significant change in right hydronephrosis. The right pelvic calcification question to be ureteral is tentatively visualized outside the course of the ureter on the current exam. 2. Rounded density in the inferior bladder surrounding the Foley balloon measuring approximately 6.3 x 6 x 5.4 cm. This is higher density than the adjacent prostate, favored to represent blood clot rather than prostatic enlargement. 3. No evidence of contrast extravasation from the urinary bladder. 4. Increased sclerosis within the L2 vertebral body with mild anterior wedging is unchanged from yesterday's exam. 5. Increased atelectasis in the lung bases with trace left pleural fluid. Aortic Atherosclerosis (ICD10-I70.0). Electronically Signed   By: Andrea Gasman M.D.   On: 08/27/2024 19:38    Assessment: Procedures: TURP (TRANSURETHRAL RESECTION OF PROSTATE) CYSTOSCOPY, WITH RETROGRADE PYELOGRAM AND URETERAL STENT INSERTION CYSTOSCOPY, WITH CLOT EVACUATION, 1 Day Post-Op  doing well.  Plan: Stop CBI. Leave foley. Remaining care per medicine service. Will follow.   Morene Salines, MD Urology 08/29/2024, 9:39 AM  "

## 2024-08-29 NOTE — TOC Initial Note (Signed)
 Transition of Care [facility]) - Initial/Assessment Note    Patient Details  Name: Cody Silva MRN: [id number] Date of Birth: Jul 27, 1946  Transition of Care [facility]) CM/SW Contact:    Bascom Service, RN Phone Number: 08/29/2024, 10:33 AM  Clinical Narrative: Legally blind, has support,has cane. Will await PT to see again to asst w/d/c plans if ST SNF recc.Last PT ntoe 12/24-need updated note. Team notified.                  Expected Discharge Plan: Skilled Nursing Facility Barriers to Discharge: Continued Medical Work up   Patient Goals and CMS Choice Patient states their goals for this hospitalization and ongoing recovery are:: Rehab CMS Medicare.gov Compare Post Acute Care list provided to:: Patient Choice offered to / list presented to : Patient [place] ownership interest in [facility].provided to:: Patient    Expected Discharge Plan and Services   Discharge Planning Services: CM Consult Post Acute Care Choice: Skilled Nursing Facility Living arrangements for the past 2 months: Single Family Home                                      Prior Living Arrangements/Services Living arrangements for the past 2 months: Single Family Home Lives with:: Spouse   Do you feel safe going back to the place where you live?: Yes               Activities of Daily Living   ADL Screening (condition at time of admission) Independently performs ADLs?: Yes (appropriate for developmental age) Is the patient deaf or have difficulty hearing?: No Does the patient have difficulty seeing, even when wearing glasses/contacts?: No Does the patient have difficulty concentrating, remembering, or making decisions?: No  Permission Sought/Granted Permission sought to share information with : Case Manager Permission granted to share information with : Yes, Verbal Permission Granted              Emotional Assessment              Admission diagnosis:  Urolithiasis  [N20.9] Right ureteral stone [N20.1] Acute kidney injury superimposed on chronic kidney disease [N17.9, N18.9] 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   Achilles tendon contracture, bilateral 02/07/2018   Right foot ulcer, limited to breakdown of skin (HCC) 02/07/2018   Memory loss 02/06/2018   Gait abnormality 02/06/2018   Diabetic neuropathy (HCC) 02/04/2018   Type II diabetes mellitus with foot ulcer (HCC) 12/05/2017   Diabetic foot infection (HCC)    Fever 12/04/2017   Fever and chills 12/04/2017   Hx of bacteremia    Wound eschar of foot    Acute sepsis (HCC) 11/12/2017   Scrotal pain    Sepsis (HCC) 11/11/2017   Atrophy of muscle of right hand 04/04/2017   Protein-calorie malnutrition, severe 01/19/2017   Altered mental status 01/09/2017   Facial droop 01/09/2017   Cerebral thrombosis with cerebral infarction 01/09/2017   Seizures (HCC)    Other hyperlipidemia    OSA  on CPAP 09/12/2016   Weight loss 09/12/2016   Malnutrition of moderate degree 09/11/2016   Syncope 09/10/2016   H/O agent Orange exposure 12/24/2015   Type 2 diabetes with nephropathy (HCC)    Near syncope 12/22/2015   History of TIAs 09/14/2015   CKD stage 3 due to type 2 diabetes mellitus (HCC) 09/14/2015   Acute lower GI bleeding 10/30/2014   History of colonic polyps 10/30/2014   Depression 10/30/2014   Symptomatic anemia 10/30/2014   Acute renal failure superimposed on stage 3b chronic kidney disease (HCC) 10/30/2014   Ataxic gait 09/21/2014   History of CVA  (cerebrovascular accident) 02/22/2013   Abnormal brain scan 02/21/2013   Dizziness 02/21/2013   Weakness 02/21/2013   Diabetes mellitus (HCC) 02/21/2013   Hypertension associated with diabetes (HCC) 02/21/2013   PCP:  [facility]   [facility] [phone] [address], [place] - [address] [place] [address] Phone: [phone] Fax: [phone]     Social Drivers of Health (SDOH) Social History: SDOH Screenings   Food Insecurity: No Food Insecurity (08/26/2024)  Housing: Low Risk (08/26/2024)  Transportation Needs: No Transportation Needs (08/26/2024)  Utilities: Not At Risk (08/26/2024)  Social Connections: Moderately Integrated (08/26/2024)  Tobacco Use: Low Risk (08/26/2024)   SDOH Interventions:     Readmission Risk Interventions    03/26/2024   10:14 AM  Readmission Risk Prevention Plan  Transportation Screening Complete  PCP or Specialist Appt within 5-7 Days Complete  Home Care Screening Complete  Medication Review (RN CM) Complete

## 2024-08-30 ENCOUNTER — Inpatient Hospital Stay (HOSPITAL_COMMUNITY)

## 2024-08-30 DIAGNOSIS — N200 Calculus of kidney: Secondary | ICD-10-CM | POA: Diagnosis not present

## 2024-08-30 LAB — CBC WITH DIFFERENTIAL/PLATELET
Abs Immature Granulocytes: 0.04 K/uL (ref 0.00–0.07)
Basophils Absolute: 0 K/uL (ref 0.0–0.1)
Basophils Relative: 0 %
Eosinophils Absolute: 0.1 K/uL (ref 0.0–0.5)
Eosinophils Relative: 1 %
HCT: 24.9 % — ABNORMAL LOW (ref 39.0–52.0)
Hemoglobin: 8.5 g/dL — ABNORMAL LOW (ref 13.0–17.0)
Immature Granulocytes: 1 %
Lymphocytes Relative: 13 %
Lymphs Abs: 1.1 K/uL (ref 0.7–4.0)
MCH: 30.9 pg (ref 26.0–34.0)
MCHC: 34.1 g/dL (ref 30.0–36.0)
MCV: 90.5 fL (ref 80.0–100.0)
Monocytes Absolute: 0.6 K/uL (ref 0.1–1.0)
Monocytes Relative: 7 %
Neutro Abs: 6.4 K/uL (ref 1.7–7.7)
Neutrophils Relative %: 78 %
Platelets: 129 K/uL — ABNORMAL LOW (ref 150–400)
RBC: 2.75 MIL/uL — ABNORMAL LOW (ref 4.22–5.81)
RDW: 14.6 % (ref 11.5–15.5)
WBC: 8.2 K/uL (ref 4.0–10.5)
nRBC: 0 % (ref 0.0–0.2)

## 2024-08-30 LAB — BASIC METABOLIC PANEL WITH GFR
Anion gap: 13 (ref 5–15)
BUN: 66 mg/dL — ABNORMAL HIGH (ref 8–23)
CO2: 19 mmol/L — ABNORMAL LOW (ref 22–32)
Calcium: 8.7 mg/dL — ABNORMAL LOW (ref 8.9–10.3)
Chloride: 111 mmol/L (ref 98–111)
Creatinine, Ser: 6.23 mg/dL — ABNORMAL HIGH (ref 0.61–1.24)
GFR, Estimated: 9 mL/min — ABNORMAL LOW
Glucose, Bld: 90 mg/dL (ref 70–99)
Potassium: 3.8 mmol/L (ref 3.5–5.1)
Sodium: 142 mmol/L (ref 135–145)

## 2024-08-30 NOTE — Progress Notes (Signed)
 " PROGRESS NOTE    Cody Silva  FMW:[id number] DOB: 1946-05-24 DOA: 08/25/2024 PCP: [facility]    Brief Narrative:   78 y.o. male with medical history significant of CVA, brain impairment, CKD stage V has previously declined dialysis, type 2 diabetes, hypertension, ascending thoracic aortic aneurysm, depression, gout, OSA, orthostatic hypotension, recurrent syncope, pancytopenia, blindness presenting with complaints of severe right sided abdominal/flank pain and vomiting.  Symptoms started over 24 hours ago.  Patient was found to have BPH with obstructive uropathy/nephropathy/AKI.  Plan cystoscopy by urology on 12/23 with laser lithotripsy, pyelogram, TURP, stone removal and stent placement.  Hematuria has improved after repeat cystoscopy on 12/25 but creatinine slowly rising.  Has been having some disequilibrium, discussed with wife therefore MRI brain ordered  Assessment & Plan:    Obstructive urolithiasis AKI on CKD stage V Patient was found to have BPH with obstructive uropathy/nephropathy/AKI. cystoscopy by urology on 12/23 with laser lithotripsy, pyelogram, TURP, stone removal and stent placement.  But hematuria persisted despite of CBI therefore repeat cystoscopy with clot evacuation, TURP, left sided retrograde pyelogram was performed on 12/25. - Off CBI for now - Baseline creatinine 4.5, admission creatinine 5.5 > 6.23 -Eventually stent removal in 2 weeks  Disequilibrium History of CVA Patient has some spatial awareness issues with just started.  Plavix  is currently on hold due to hematuria.  Continuing Lipitor - Discussed with wife, MRI brain ordered (wife would prefer to skip CT scan due to previous issues of being low sensitivity in picking up his mini strokes)    Chronic normocytic anemia Acute blood loss anemia Hemoglobin lower than 7, required 2 units of PRBC, hemoglobin stable around 8.5   Hypertensive urgency, improved Essential  hypertension Improved but still elevated.  Continue Norvasc  and clonidine , will increase IV as needed   Diet controlled diabetes Hemoglobin A1c 4.5 in July 2025.     Mild chronic thrombocytopenia Platelet count stable, monitor labs.   Depression Continue Lexapro .   Chronic pain/neuropathy Continue gabapentin .   Incidental finding of subacute lumbar compression fracture on CT CT showing mild anterior wedging of the L2 vertebral body with increased patchy sclerosis, possibly representing healing subacute compression fracture but underlying metastasis is not excluded.  Patient is not endorsing lumbar back pain at this time and no falls reported.  Radiologist recommending bone scintigraphy or MRI.  Patient will need close outpatient follow-up with PCP for further workup.  DVT prophylaxis: SCDs Start: 08/26/24 0511    Code Status: Full Code Family Communication:   Status is: Inpatient Remains inpatient appropriate because: Ongoing management for hematuria & AKI and pending MRI   PT Follow up Recs: Skilled Nursing-Short Term Rehab (<3 Hours/Day)08/29/2024 1211  Subjective: Hematuria resolved, still weak  Examination:  General exam: Appears calm and comfortable  Respiratory system: Clear to auscultation. Respiratory effort normal. Cardiovascular system: S1 & S2 heard, RRR. No JVD, murmurs, rubs, gallops or clicks. No pedal edema. Gastrointestinal system: Abdomen is nondistended, soft and nontender. No organomegaly or masses felt. Normal bowel sounds heard. Central nervous system: Alert and oriented. No focal neurological deficits. Extremities: Symmetric 5 x 5 power. Skin: No rashes, lesions or ulcers Psychiatry: Judgement and insight appear normal. Mood & affect appropriate. Catheter in place for CBI               Diet Orders (From admission, onward)     Start     Ordered   08/28/24 1108  Diet renal/carb modified with fluid restriction Diet-HS Snack?  Nothing; Fluid  restriction: 1200 mL Fluid; Room service appropriate? Yes; Fluid consistency: Thin  Diet effective now       Question Answer Comment  Diet-HS Snack? Nothing   Fluid restriction: 1200 mL Fluid   Room service appropriate? Yes   Fluid consistency: Thin      08/28/24 1107            Objective: Vitals:   08/29/24 1004 08/29/24 1424 08/29/24 2011 08/30/24 0514  BP: (!) 151/84 134/67 (!) 168/76 (!) 140/75  Pulse: 83 75 81 74  Resp: 18 16 16 17   Temp: 97.9 F (36.6 C) 98 F (36.7 C) 98.9 F (37.2 C) 98.3 F (36.8 C)  TempSrc: Oral Oral Oral Oral  SpO2: 100% 100% 100% 100%  Weight:      Height:        Intake/Output Summary (Last 24 hours) at 08/30/2024 1053 Last data filed at 08/30/2024 1000 Gross per 24 hour  Intake 3120 ml  Output 2700 ml  Net 420 ml   Filed Weights   08/25/24 1642 08/26/24 1625  Weight: 68.9 kg 69.3 kg    Scheduled Meds:  sodium chloride    Intravenous Once   amLODipine   10 mg Oral QHS   atorvastatin   40 mg Oral QHS   calcitRIOL   0.25 mcg Oral Daily   Chlorhexidine  Gluconate Cloth  6 each Topical Daily   cloNIDine   0.1 mg Transdermal Once   dorzolamide -timolol   1 drop Both Eyes BID   escitalopram   10 mg Oral QHS   ferrous sulfate   325 mg Oral Q M,W,F   gabapentin   300 mg Oral QHS   latanoprost   1 drop Both Eyes QHS   Continuous Infusions:  cefTRIAXone  (ROCEPHIN )  IV 1 g (08/30/24 9166)   sodium chloride  irrigation      Nutritional status     Body mass index is 18.6 kg/m.  Data Reviewed:   CBC: Recent Labs  Lab 08/27/24 0513 08/27/24 1600 08/28/24 0517 08/28/24 1743 08/29/24 0443 08/30/24 0534  WBC 13.3*  --  10.1 14.7* 12.3* 8.2  NEUTROABS 11.5*  --  8.7* 13.7* 10.6* 6.4  HGB 9.8* 8.0* 6.6* 10.3* 8.7* 8.5*  HCT 29.4* 24.3* 19.9* 31.1* 26.3* 24.9*  MCV 93.6  --  93.9 90.9 90.1 90.5  PLT 129*  --  114* 105* 121* 129*   Basic Metabolic Panel: Recent Labs  Lab 08/26/24 0547 08/27/24 0513 08/28/24 0517 08/29/24 0443  08/30/24 0534  NA 143 145 145 145 142  K 3.6 4.0 3.9 4.4 3.8  CL 111 115* 115* 114* 111  CO2 21* 19* 17* 18* 19*  GLUCOSE 88 110* 110* 111* 90  BUN 50* 48* 48* 63* 66*  CREATININE 5.55* 5.26* 5.51* 5.80* 6.23*  CALCIUM  9.1 9.2 9.0 9.1 8.7*  MG  --   --  1.9  --   --   PHOS  --   --  4.8*  --   --    GFR: Estimated Creatinine Clearance: 9.7 mL/min (A) (by C-G formula based on SCr of 6.23 mg/dL (H)). Liver Function Tests: Recent Labs  Lab 08/25/24 1648  AST 16  ALT 6  ALKPHOS 68  BILITOT 0.6  PROT 7.0  ALBUMIN 4.2   Recent Labs  Lab 08/25/24 1648  LIPASE 25   No results for input(s): AMMONIA in the last 168 hours. Coagulation Profile: No results for input(s): INR, PROTIME in the last 168 hours. Cardiac Enzymes: No results for input(s): CKTOTAL, CKMB, CKMBINDEX, TROPONINI  in the last 168 hours. BNP (last 3 results) No results for input(s): PROBNP in the last 8760 hours. HbA1C: No results for input(s): HGBA1C in the last 72 hours. CBG: Recent Labs  Lab 08/26/24 0947 08/26/24 1210 08/26/24 1245 08/28/24 1109  GLUCAP 82 76 127* 97   Lipid Profile: No results for input(s): CHOL, HDL, LDLCALC, TRIG, CHOLHDL, LDLDIRECT in the last 72 hours. Thyroid  Function Tests: No results for input(s): TSH, T4TOTAL, FREET4, T3FREE, THYROIDAB in the last 72 hours. Anemia Panel: No results for input(s): VITAMINB12, FOLATE, FERRITIN, TIBC, IRON, RETICCTPCT in the last 72 hours. Sepsis Labs: Recent Labs  Lab 08/26/24 0840 08/26/24 1703  LATICACIDVEN 0.8 1.0    Recent Results (from the past 240 hours)  Urine Culture (for pregnant, neutropenic or urologic patients or patients with an indwelling urinary catheter)     Status: None   Collection Time: 08/26/24  8:40 AM   Specimen: Urine, Clean Catch  Result Value Ref Range Status   Specimen Description URINE, CLEAN CATCH  Final   Special Requests NONE  Final   Culture   Final     NO GROWTH Performed at [facility], [address], [place], [place] [address]    Report Status 08/27/2024 FINAL  Final         Radiology Studies: DG C-Arm 1-60 Min-No Report Result Date: 08/28/2024 Fluoroscopy was utilized by the requesting physician.  No radiographic interpretation.           LOS: 4 days   Time spent= 35 mins    [place] JAYSON Dare, MD [facility]  If 7PM-7AM, please contact night-coverage  08/30/2024, 10:53 AM  "

## 2024-08-30 NOTE — Plan of Care (Signed)
  Problem: Education: Goal: Knowledge of General Education information will improve Description: Including pain rating scale, medication(s)/side effects and non-pharmacologic comfort measures Outcome: Progressing   Problem: Clinical Measurements: Goal: Ability to maintain clinical measurements within normal limits will improve Outcome: Progressing   Problem: Safety: Goal: Ability to remain free from injury will improve Outcome: Progressing   

## 2024-08-30 NOTE — Plan of Care (Signed)

## 2024-08-30 NOTE — Progress Notes (Signed)
 "  Urology Inpatient Progress Report  Urolithiasis [N20.9] Right ureteral stone [N20.1] Acute kidney injury superimposed on chronic kidney disease [N17.9, N18.9] Procedures: TURP (TRANSURETHRAL RESECTION OF PROSTATE) CYSTOSCOPY, WITH RETROGRADE PYELOGRAM AND URETERAL STENT INSERTION CYSTOSCOPY, WITH CLOT EVACUATION 2 Days Post-Op  Intv/Subj: No acute events overnight. Patient is without complaint - sleeping CBI was discontinued yesterday - urine remains clear today  Principal Problem:   Urolithiasis Active Problems:   Diabetes mellitus (HCC)   Depression   AKI (acute kidney injury)   Hypertensive urgency  Current Facility-Administered Medications  Medication Dose Route Frequency Provider Last Rate Last Admin   0.9 %  sodium chloride  infusion (Manually program via Guardrails IV Fluids)   Intravenous Once Shane Steffan BROCKS, MD       acetaminophen  (TYLENOL ) tablet 650 mg  650 mg Oral Q6H PRN Rathore, Vasundhra, MD   650 mg at 08/30/24 9171   Or   acetaminophen  (TYLENOL ) suppository 650 mg  650 mg Rectal Q6H PRN Alfornia Madison, MD       amLODipine  (NORVASC ) tablet 10 mg  10 mg Oral QHS Amin, Ankit C, MD   10 mg at 08/29/24 2133   atorvastatin  (LIPITOR) tablet 40 mg  40 mg Oral QHS Rathore, Vasundhra, MD   40 mg at 08/29/24 2133   calcitRIOL  (ROCALTROL ) capsule 0.25 mcg  0.25 mcg Oral Daily Amin, Ankit C, MD   0.25 mcg at 08/30/24 9170   cefTRIAXone  (ROCEPHIN ) 1 g in sodium chloride  0.9 % 100 mL IVPB  1 g Intravenous Q24H Samtani, Jai-Gurmukh, MD 200 mL/hr at 08/30/24 0833 1 g at 08/30/24 9166   Chlorhexidine  Gluconate Cloth 2 % PADS 6 each  6 each Topical Daily Samtani, Jai-Gurmukh, MD   6 each at 08/29/24 0827   cloNIDine  (CATAPRES  - Dosed in mg/24 hr) patch 0.1 mg  0.1 mg Transdermal Once Griselda Norris, MD   0.1 mg at 08/26/24 0128   dorzolamide -timolol  (COSOPT ) 2-0.5 % ophthalmic solution 1 drop  1 drop Both Eyes BID Rathore, Vasundhra, MD   1 drop at 08/30/24 9170    escitalopram  (LEXAPRO ) tablet 10 mg  10 mg Oral QHS Rathore, Vasundhra, MD   10 mg at 08/29/24 2133   ferrous sulfate  tablet 325 mg  325 mg Oral Q M,W,F Amin, Ankit C, MD   325 mg at 08/30/24 9171   gabapentin  (NEURONTIN ) capsule 300 mg  300 mg Oral QHS Rathore, Vasundhra, MD   300 mg at 08/29/24 2133   glucagon  (human recombinant) (GLUCAGEN) injection 1 mg  1 mg Intravenous PRN Amin, Ankit C, MD       hydrALAZINE  (APRESOLINE ) injection 10 mg  10 mg Intravenous Q4H PRN Amin, Ankit C, MD   10 mg at 08/27/24 2120   HYDROmorphone  (DILAUDID ) injection 0.5 mg  0.5 mg Intravenous Q4H PRN Rathore, Vasundhra, MD   0.5 mg at 08/27/24 0106   ipratropium-albuterol  (DUONEB) 0.5-2.5 (3) MG/3ML nebulizer solution 3 mL  3 mL Nebulization Q4H PRN Amin, Ankit C, MD       latanoprost  (XALATAN ) 0.005 % ophthalmic solution 1 drop  1 drop Both Eyes QHS Alfornia Madison, MD   1 drop at 08/29/24 2132   metoprolol  tartrate (LOPRESSOR ) injection 5 mg  5 mg Intravenous Q4H PRN Amin, Ankit C, MD       naloxone  (NARCAN ) injection 0.4 mg  0.4 mg Intravenous PRN Rathore, Vasundhra, MD       ondansetron  (ZOFRAN ) injection 4 mg  4 mg Intravenous Q6H PRN Rathore, Vasundhra,  MD       sodium chloride  irrigation 0.9 % 3,000 mL  3,000 mL Irrigation Continuous Shane Steffan BROCKS, MD   3,000 mL at 08/28/24 0202     Objective: Vital: Vitals:   08/29/24 1004 08/29/24 1424 08/29/24 2011 08/30/24 0514  BP: (!) 151/84 134/67 (!) 168/76 (!) 140/75  Pulse: 83 75 81 74  Resp: 18 16 16 17   Temp: 97.9 F (36.6 C) 98 F (36.7 C) 98.9 F (37.2 C) 98.3 F (36.8 C)  TempSrc: Oral Oral Oral Oral  SpO2: 100% 100% 100% 100%  Weight:      Height:       I/Os: I/O last 3 completed shifts: In: 6460 [P.O.:960; Other:5500] Out: 6500 [Urine:6500]  Physical Exam:  General: Patient is in no apparent distress Lungs: Normal respiratory effort, chest expands symmetrically. GI:  The abdomen is soft and nontender without mass. Foley:  draining clear yellow urine Ext: lower extremities symmetric  Lab Results: Recent Labs    08/28/24 1743 08/29/24 0443 08/30/24 0534  WBC 14.7* 12.3* 8.2  HGB 10.3* 8.7* 8.5*  HCT 31.1* 26.3* 24.9*   Recent Labs    08/28/24 0517 08/29/24 0443 08/30/24 0534  NA 145 145 142  K 3.9 4.4 3.8  CL 115* 114* 111  CO2 17* 18* 19*  GLUCOSE 110* 111* 90  BUN 48* 63* 66*  CREATININE 5.51* 5.80* 6.23*  CALCIUM  9.0 9.1 8.7*   No results for input(s): LABPT, INR in the last 72 hours. No results for input(s): LABURIN in the last 72 hours. Results for orders placed or performed during the hospital encounter of 08/25/24  Urine Culture (for pregnant, neutropenic or urologic patients or patients with an indwelling urinary catheter)     Status: None   Collection Time: 08/26/24  8:40 AM   Specimen: Urine, Clean Catch  Result Value Ref Range Status   Specimen Description URINE, CLEAN CATCH  Final   Special Requests NONE  Final   Culture   Final    NO GROWTH Performed at [facility], [address], [place], [place] [address]    Report Status 08/27/2024 FINAL  Final    Studies/Results: No results found.   Assessment: Procedures: TURP (TRANSURETHRAL RESECTION OF PROSTATE) CYSTOSCOPY, WITH RETROGRADE PYELOGRAM AND URETERAL STENT INSERTION CYSTOSCOPY, WITH CLOT EVACUATION, 2 Days Post-Op  doing well. Urine clear, renal function progressively worsening.  His renal failure is unlikely related to obstruction.  Plan: Will need a voiding trial at some point, as early as tomorrow, but would suggest keeping foley until the etiology of his renal failure is figured out.  When the catheter is removed the ureteral stent will also come out since it is tied to the catheter.   Morene Salines, MD Urology 08/30/2024, 12:36 PM  "

## 2024-08-31 DIAGNOSIS — N179 Acute kidney failure, unspecified: Secondary | ICD-10-CM | POA: Diagnosis not present

## 2024-08-31 DIAGNOSIS — N189 Chronic kidney disease, unspecified: Secondary | ICD-10-CM

## 2024-08-31 LAB — CBC WITH DIFFERENTIAL/PLATELET
Abs Immature Granulocytes: 0.02 K/uL (ref 0.00–0.07)
Basophils Absolute: 0 K/uL (ref 0.0–0.1)
Basophils Relative: 0 %
Eosinophils Absolute: 0.2 K/uL (ref 0.0–0.5)
Eosinophils Relative: 3 %
HCT: 24.6 % — ABNORMAL LOW (ref 39.0–52.0)
Hemoglobin: 8.2 g/dL — ABNORMAL LOW (ref 13.0–17.0)
Immature Granulocytes: 0 %
Lymphocytes Relative: 17 %
Lymphs Abs: 1 K/uL (ref 0.7–4.0)
MCH: 30 pg (ref 26.0–34.0)
MCHC: 33.3 g/dL (ref 30.0–36.0)
MCV: 90.1 fL (ref 80.0–100.0)
Monocytes Absolute: 0.6 K/uL (ref 0.1–1.0)
Monocytes Relative: 9 %
Neutro Abs: 4.1 K/uL (ref 1.7–7.7)
Neutrophils Relative %: 71 %
Platelets: 128 K/uL — ABNORMAL LOW (ref 150–400)
RBC: 2.73 MIL/uL — ABNORMAL LOW (ref 4.22–5.81)
RDW: 14.2 % (ref 11.5–15.5)
WBC: 5.9 K/uL (ref 4.0–10.5)
nRBC: 0 % (ref 0.0–0.2)

## 2024-08-31 LAB — BASIC METABOLIC PANEL WITH GFR
Anion gap: 10 (ref 5–15)
BUN: 52 mg/dL — ABNORMAL HIGH (ref 8–23)
CO2: 21 mmol/L — ABNORMAL LOW (ref 22–32)
Calcium: 8.8 mg/dL — ABNORMAL LOW (ref 8.9–10.3)
Chloride: 109 mmol/L (ref 98–111)
Creatinine, Ser: 5.7 mg/dL — ABNORMAL HIGH (ref 0.61–1.24)
GFR, Estimated: 10 mL/min — ABNORMAL LOW
Glucose, Bld: 119 mg/dL — ABNORMAL HIGH (ref 70–99)
Potassium: 3.4 mmol/L — ABNORMAL LOW (ref 3.5–5.1)
Sodium: 140 mmol/L (ref 135–145)

## 2024-08-31 MED ORDER — SORBITOL 70 % SOLN
30.0000 mL | Freq: Once | Status: AC
  Administered 2024-08-31: 30 mL via ORAL
  Filled 2024-08-31: qty 30

## 2024-08-31 MED ORDER — LACTATED RINGERS IV SOLN: INTRAVENOUS | Status: AC

## 2024-08-31 NOTE — Progress Notes (Signed)
 Patient confused at this time. Upon AM assessment, patient was A&Ox3 (believed year was 2026). Patient currently still believes year is 2026, but thinks he just left a restaurant and was playing cards with some men. Also thought there were children in his room. States he keeps forgetting he's in the hospital. Pt pleasant, easy to redirect. Pt pulled out PIV accidentally- new PIV placed. MD Regalado made aware of confusion- no new orders. Will continue to monitor closely, and re-orient as needed.

## 2024-08-31 NOTE — Progress Notes (Signed)
 " PROGRESS NOTE    Cody Silva  FMW:[id number] DOB: 03-Mar-1946 DOA: 08/25/2024 PCP: [facility]    Brief Narrative:  78 y.o. male with medical history significant of CVA, brain impairment, CKD stage V has previously declined dialysis, type 2 diabetes, hypertension, ascending thoracic aortic aneurysm, depression, gout, OSA, orthostatic hypotension, recurrent syncope, pancytopenia, blindness presenting with complaints of severe right sided abdominal/flank pain and vomiting.  Symptoms started over 24 hours ago.  Patient was found to have BPH with obstructive uropathy/nephropathy/AKI. Underwent cystoscopy by urology on 12/23 with laser lithotripsy, pyelogram, TURP, stone removal and stent placement.  Hematuria has improved after repeat cystoscopy on 12/25 but creatinine slowly rising.  Has been having some disequilibrium, MRI brain obtain negative for acute finding.   Assessment & Plan:  Obstructive urolithiasis AKI on CKD stage V Patient was found to have BPH with obstructive uropathy/nephropathy/AKI. cystoscopy by urology on 12/23 with laser lithotripsy, pyelogram, TURP, stone removal and stent placement.  But hematuria persisted despite of CBI therefore repeat cystoscopy with clot evacuation, TURP, left sided retrograde pyelogram was performed on 12/25. - Off CBI for now - Baseline creatinine 4.5, admission creatinine 5.5 > 6.23---5 -Eventually stent removal in 2 weeks Plan to start IV fluids for 24 hours, monitor renal function.   Disequilibrium History of CVA Patient has some spatial awareness issues with just started.  Plavix  is currently on hold due to hematuria.  Continuing Lipitor -MRI negative    Chronic normocytic anemia Acute blood loss anemia Hemoglobin lower than 7, required 2 units of PRBC, hemoglobin stable around 8.5 Monitor hb  Hypertensive urgency, improved Essential hypertension Improved but still elevated.  Continue Norvasc  and clonidine .    Diet  controlled diabetes Hemoglobin A1c 4.5 in July 2025.   Mild chronic thrombocytopenia Platelet count stable, monitor labs.   Depression Continue Lexapro .   Chronic pain/neuropathy Continue gabapentin .   Incidental finding of subacute lumbar compression fracture on CT CT showing mild anterior wedging of the L2 vertebral body with increased patchy sclerosis, possibly representing healing subacute compression fracture but underlying metastasis is not excluded.  Patient is not endorsing lumbar back pain at this time and no falls reported.  Radiologist recommending bone scintigraphy or MRI.  Patient will need close outpatient follow-up with PCP for further workup.  DVT prophylaxis: SCDs Start: 08/26/24 0511    Code Status: Full Code Family Communication:   Status is: Inpatient Remains inpatient appropriate because: Ongoing management for hematuria & AKI and pending MRI   PT Follow up Recs: Skilled Nursing-Short Term Rehab (<3 Hours/Day)08/29/2024 1211  Subjective: He is alert, report some oral intake. Denies pain. Urine is more clear.   Examination:  General exam: NAD Respiratory system: CTA Cardiovascular system: S 1, S 2 RRR Gastrointestinal system: BS present, soft, nt Central nervous system: NAD Extremities: no edema   Diet Orders (From admission, onward)     Start     Ordered   08/28/24 1108  Diet renal/carb modified with fluid restriction Diet-HS Snack? Nothing; Fluid restriction: 1200 mL Fluid; Room service appropriate? Yes; Fluid consistency: Thin  Diet effective now       Question Answer Comment  Diet-HS Snack? Nothing   Fluid restriction: 1200 mL Fluid   Room service appropriate? Yes   Fluid consistency: Thin      08/28/24 1107            Objective: Vitals:   08/30/24 0514 08/30/24 1328 08/30/24 2124 08/31/24 0523  BP: (!) 140/75 (!) 148/88 ROLLEN)  151/77 137/66  Pulse: 74 75 78 70  Resp: 17 14    Temp: 98.3 F (36.8 C) 98.4 F (36.9 C) 98.9 F (37.2 C)  98.3 F (36.8 C)  TempSrc: Oral Oral Oral Oral  SpO2: 100% 100% 100% 100%  Weight:      Height:        Intake/Output Summary (Last 24 hours) at 08/31/2024 0726 Last data filed at 08/31/2024 0500 Gross per 24 hour  Intake 770 ml  Output 2950 ml  Net -2180 ml   Filed Weights   08/25/24 1642 08/26/24 1625  Weight: 68.9 kg 69.3 kg    Scheduled Meds:  sodium chloride    Intravenous Once   amLODipine   10 mg Oral QHS   atorvastatin   40 mg Oral QHS   calcitRIOL   0.25 mcg Oral Daily   Chlorhexidine  Gluconate Cloth  6 each Topical Daily   cloNIDine   0.1 mg Transdermal Once   dorzolamide -timolol   1 drop Both Eyes BID   escitalopram   10 mg Oral QHS   ferrous sulfate   325 mg Oral Q M,W,F   gabapentin   300 mg Oral QHS   latanoprost   1 drop Both Eyes QHS   Continuous Infusions:  cefTRIAXone  (ROCEPHIN )  IV 1 g (08/30/24 9166)   sodium chloride  irrigation      Nutritional status     Body mass index is 18.6 kg/m.  Data Reviewed:   CBC: Recent Labs  Lab 08/28/24 0517 08/28/24 1743 08/29/24 0443 08/30/24 0534 08/31/24 0544  WBC 10.1 14.7* 12.3* 8.2 5.9  NEUTROABS 8.7* 13.7* 10.6* 6.4 4.1  HGB 6.6* 10.3* 8.7* 8.5* 8.2*  HCT 19.9* 31.1* 26.3* 24.9* 24.6*  MCV 93.9 90.9 90.1 90.5 90.1  PLT 114* 105* 121* 129* 128*   Basic Metabolic Panel: Recent Labs  Lab 08/27/24 0513 08/28/24 0517 08/29/24 0443 08/30/24 0534 08/31/24 0544  NA 145 145 145 142 140  K 4.0 3.9 4.4 3.8 3.4*  CL 115* 115* 114* 111 109  CO2 19* 17* 18* 19* 21*  GLUCOSE 110* 110* 111* 90 119*  BUN 48* 48* 63* 66* 52*  CREATININE 5.26* 5.51* 5.80* 6.23* 5.70*  CALCIUM  9.2 9.0 9.1 8.7* 8.8*  MG  --  1.9  --   --   --   PHOS  --  4.8*  --   --   --    GFR: Estimated Creatinine Clearance: 10.6 mL/min (A) (by C-G formula based on SCr of 5.7 mg/dL (H)). Liver Function Tests: Recent Labs  Lab 08/25/24 1648  AST 16  ALT 6  ALKPHOS 68  BILITOT 0.6  PROT 7.0  ALBUMIN 4.2   Recent Labs  Lab  08/25/24 1648  LIPASE 25   No results for input(s): AMMONIA in the last 168 hours. Coagulation Profile: No results for input(s): INR, PROTIME in the last 168 hours. Cardiac Enzymes: No results for input(s): CKTOTAL, CKMB, CKMBINDEX, TROPONINI in the last 168 hours. BNP (last 3 results) No results for input(s): PROBNP in the last 8760 hours. HbA1C: No results for input(s): HGBA1C in the last 72 hours. CBG: Recent Labs  Lab 08/26/24 0947 08/26/24 1210 08/26/24 1245 08/28/24 1109  GLUCAP 82 76 127* 97   Lipid Profile: No results for input(s): CHOL, HDL, LDLCALC, TRIG, CHOLHDL, LDLDIRECT in the last 72 hours. Thyroid  Function Tests: No results for input(s): TSH, T4TOTAL, FREET4, T3FREE, THYROIDAB in the last 72 hours. Anemia Panel: No results for input(s): VITAMINB12, FOLATE, FERRITIN, TIBC, IRON, RETICCTPCT in the last 72  hours. Sepsis Labs: Recent Labs  Lab 08/26/24 0840 08/26/24 1703  LATICACIDVEN 0.8 1.0    Recent Results (from the past 240 hours)  Urine Culture (for pregnant, neutropenic or urologic patients or patients with an indwelling urinary catheter)     Status: None   Collection Time: 08/26/24  8:40 AM   Specimen: Urine, Clean Catch  Result Value Ref Range Status   Specimen Description URINE, CLEAN CATCH  Final   Special Requests NONE  Final   Culture   Final    NO GROWTH Performed at [facility], [address] [place], [place] [address]    Report Status 08/27/2024 FINAL  Final         Radiology Studies: MR BRAIN WO CONTRAST Result Date: 08/31/2024 EXAM: MRI BRAIN WITHOUT CONTRAST 08/30/2024 12:57:50 PM TECHNIQUE: Multiplanar multisequence MRI of the head/brain was performed without the administration of intravenous contrast. COMPARISON: MR Head 03/26/2024. CLINICAL HISTORY: Mental status change, unknown cause. FINDINGS: BRAIN AND VENTRICLES: No acute infarct. No acute intracranial  hemorrhage. No mass. No midline shift. No hydrocephalus. Similar moderate chronic microvascular ischemic change and cerebral atrophy. Similar scattered chronic cerebral microhemorrhages, including in both thalami and likely the sequelae of chronic hypertension. Normal flow voids. ORBITS: No acute abnormality. SINUSES AND MASTOIDS: No acute abnormality. BONES AND SOFT TISSUES: Normal marrow signal. No acute soft tissue abnormality. IMPRESSION: 1. No acute intracranial abnormality. Electronically signed by: Gilmore Molt 08/31/2024 02:09 AM EST RP Workstation: [id number]           LOS: 5 days   Time spent= 35 mins    Owen DELENA Lore, MD [facility]  If 7PM-7AM, please contact night-coverage  08/31/2024, 7:26 AM  "

## 2024-09-01 DIAGNOSIS — N189 Chronic kidney disease, unspecified: Secondary | ICD-10-CM | POA: Diagnosis not present

## 2024-09-01 DIAGNOSIS — N179 Acute kidney failure, unspecified: Secondary | ICD-10-CM | POA: Diagnosis not present

## 2024-09-01 LAB — TYPE AND SCREEN
ABO/RH(D): O POS
Antibody Screen: NEGATIVE
Unit division: 0
Unit division: 0
Unit division: 0
Unit division: 0

## 2024-09-01 LAB — CBC WITH DIFFERENTIAL/PLATELET
Abs Immature Granulocytes: 0.02 K/uL (ref 0.00–0.07)
Basophils Absolute: 0 K/uL (ref 0.0–0.1)
Basophils Relative: 0 %
Eosinophils Absolute: 0.2 K/uL (ref 0.0–0.5)
Eosinophils Relative: 3 %
HCT: 24.8 % — ABNORMAL LOW (ref 39.0–52.0)
Hemoglobin: 8.2 g/dL — ABNORMAL LOW (ref 13.0–17.0)
Immature Granulocytes: 0 %
Lymphocytes Relative: 15 %
Lymphs Abs: 1 K/uL (ref 0.7–4.0)
MCH: 29.8 pg (ref 26.0–34.0)
MCHC: 33.1 g/dL (ref 30.0–36.0)
MCV: 90.2 fL (ref 80.0–100.0)
Monocytes Absolute: 0.5 K/uL (ref 0.1–1.0)
Monocytes Relative: 8 %
Neutro Abs: 4.7 K/uL (ref 1.7–7.7)
Neutrophils Relative %: 74 %
Platelets: 139 K/uL — ABNORMAL LOW (ref 150–400)
RBC: 2.75 MIL/uL — ABNORMAL LOW (ref 4.22–5.81)
RDW: 13.8 % (ref 11.5–15.5)
WBC: 6.4 K/uL (ref 4.0–10.5)
nRBC: 0 % (ref 0.0–0.2)

## 2024-09-01 LAB — BPAM RBC
Blood Product Expiration Date: 202601032359
Blood Product Expiration Date: 202601052359
Blood Product Expiration Date: 202601232359
Blood Product Expiration Date: 202601272359
ISSUE DATE / TIME: 202512250913
ISSUE DATE / TIME: 202512250913
Unit Type and Rh: 5100
Unit Type and Rh: 5100
Unit Type and Rh: 9500
Unit Type and Rh: 9500

## 2024-09-01 LAB — BASIC METABOLIC PANEL WITH GFR
Anion gap: 9 (ref 5–15)
BUN: 54 mg/dL — ABNORMAL HIGH (ref 8–23)
CO2: 20 mmol/L — ABNORMAL LOW (ref 22–32)
Calcium: 8.8 mg/dL — ABNORMAL LOW (ref 8.9–10.3)
Chloride: 112 mmol/L — ABNORMAL HIGH (ref 98–111)
Creatinine, Ser: 5.04 mg/dL — ABNORMAL HIGH (ref 0.61–1.24)
GFR, Estimated: 11 mL/min — ABNORMAL LOW
Glucose, Bld: 92 mg/dL (ref 70–99)
Potassium: 3.4 mmol/L — ABNORMAL LOW (ref 3.5–5.1)
Sodium: 141 mmol/L (ref 135–145)

## 2024-09-01 LAB — VITAMIN B12: Vitamin B-12: 813 pg/mL (ref 180–914)

## 2024-09-01 LAB — SURGICAL PATHOLOGY

## 2024-09-01 LAB — AMMONIA: Ammonia: 15 umol/L (ref 9–35)

## 2024-09-01 MED ORDER — ORAL CARE MOUTH RINSE: 15.0000 mL | OROMUCOSAL | Status: DC | PRN

## 2024-09-01 MED ORDER — NEPRO/CARBSTEADY PO LIQD
237.0000 mL | Freq: Two times a day (BID) | ORAL | Status: AC
  Administered 2024-09-01 – 2024-09-10 (×9): 237 mL via ORAL
  Filled 2024-09-01 (×19): qty 237

## 2024-09-01 MED ORDER — THIAMINE MONONITRATE 100 MG PO TABS
100.0000 mg | ORAL_TABLET | Freq: Every day | ORAL | Status: DC
  Administered 2024-09-01 – 2024-09-10 (×10): 100 mg via ORAL
  Filled 2024-09-01 (×10): qty 1

## 2024-09-01 MED ORDER — LACTATED RINGERS IV SOLN: INTRAVENOUS | Status: AC

## 2024-09-01 NOTE — TOC Progression Note (Signed)
 Transition of Care [facility]) - Progression Note    Patient Details  Name: Cody Silva MRN: [id number] Date of Birth: 17-Oct-1945  [facility] CM/SW Contact  Mistey Hoffert, [name], RN Phone Number: 09/01/2024, 4:05 PM  Clinical Narrative:  Per patient permission faxed out await bed offers prior choice, & auth.     Expected Discharge Plan: Skilled Nursing Facility Barriers to Discharge: Continued Medical Work up               Expected Discharge Plan and Services   Discharge Planning Services: CM Consult Post Acute Care Choice: Skilled Nursing Facility Living arrangements for the past 2 months: Single Family Home                                       Social Drivers of Health (SDOH) Interventions SDOH Screenings   Food Insecurity: No Food Insecurity (08/26/2024)  Housing: Low Risk (08/26/2024)  Transportation Needs: No Transportation Needs (08/26/2024)  Utilities: Not At Risk (08/26/2024)  Social Connections: Moderately Integrated (08/26/2024)  Tobacco Use: Low Risk (08/26/2024)    Readmission Risk Interventions    03/26/2024   10:14 AM  Readmission Risk Prevention Plan  Transportation Screening Complete  PCP or Specialist Appt within 5-7 Days Complete  Home Care Screening Complete  Medication Review (RN CM) Complete

## 2024-09-01 NOTE — Progress Notes (Addendum)
 Physical Therapy Treatment Patient Details Name: Cody Silva MRN: [id number] DOB: [birth year] Today's Date: 09/01/2024   History of Present Illness 78 y.o. male with presenting with complaints of severe right sided abdominal/flank pain and vomiting. Patient was found to have BPH with obstructive uropathy/nephropathy/AKI. Plan cystoscopy by urology on 12/23 with laser lithotripsy, pyelogram, TURP, stone removal and stent placement. Following this still having hematuria. PMH significant of CVA with R sided deficits, brain impairment, CKD stage V has previously declined dialysis, type 2 diabetes, hypertension, ascending thoracic aortic aneurysm, depression, gout, OSA, orthostatic hypotension, recurrent syncope, pancytopenia, blindness    PT Comments  Pt agreeable to working with therapy with some encouragement. He continues to favor L side with regards to head, neck, and trunk positioning. Mod +2 required for mobility on today. High fall risk with near fall while ambulating towards door. Pt reports he feels off balance, he reports gross and fine motor deficits which is all new to him per pt report. He continues to report seeing objects that are not there with some general confusion noted as well. Encouraged pt to sit up as tolerated in recliner. Updated RN. Patient will benefit from continued inpatient follow up therapy, <3 hours/day    If plan is discharge home, recommend the following: A lot of help with bathing/dressing/bathroom;Assistance with cooking/housework;A lot of help with walking and/or transfers;Assist for transportation;Help with stairs or ramp for entrance   Can travel by private vehicle     No  Equipment Recommendations  None recommended by PT    Recommendations for Other Services       Precautions / Restrictions Precautions Precautions: Fall Precaution/Restrictions Comments: having some hallucinations? Restrictions Weight Bearing Restrictions Per Provider Order: No      Mobility  Bed Mobility Overal bed mobility: Needs Assistance Bed Mobility: Supine to Sit     Supine to sit: Mod assist     General bed mobility comments: Assist for bil LEs off bed, assist for trunk and scooting to EOB. Increased time. Repeated multimodal cueing required.    Transfers Overall transfer level: Needs assistance Equipment used: Rolling walker (2 wheels) Transfers: Sit to/from Stand Sit to Stand: Mod assist, +2 physical assistance; +2 safety/equipment;From elevated surface           General transfer comment: Assis to power up, stabilize, control descent into chair. Pt continues to favor/lean towards L side with LOB without external support. Fall risk.    Ambulation/Gait Ambulation/Gait assistance: Mod assist, +2 physical assistance, +2 safety/equipment Gait Distance (Feet): 3 Feet Assistive device: Rolling walker (2 wheels) Gait Pattern/deviations: Step-to pattern       General Gait Details: Assist to stabilize pt and guide RW. Cues for step to technique to help with stability. High fall risk with LOB without significant external support. Assist to prevent fall and to assist into recliner.   Stairs             Wheelchair Mobility     Tilt Bed    Modified Rankin (Stroke Patients Only)       Balance Overall balance assessment: Needs assistance         Standing balance support: During functional activity, Bilateral upper extremity supported, Reliant on assistive device for balance Standing balance-Leahy Scale: Poor                              Communication Communication Communication: No apparent difficulties  Cognition Arousal: Alert Behavior During Therapy:  WFL for tasks assessed/performed   PT - Cognitive impairments: No family/caregiver present to determine baseline                       PT - Cognition Comments: pt having some hallucinations intermittently Following commands: Impaired Following commands  impaired: Follows one step commands with increased time    Cueing Cueing Techniques: Verbal cues, Tactile cues  Exercises      General Comments        Pertinent Vitals/Pain Pain Assessment Pain Assessment: Faces Faces Pain Scale: No hurt    Home Living                          Prior Function            PT Goals (current goals can now be found in the care plan section) Progress towards PT goals: Progressing toward goals    Frequency    Min 2X/week      PT Plan      Co-evaluation              AM-PAC PT 6 Clicks Mobility   Outcome Measure  Help needed turning from your back to your side while in a flat bed without using bedrails?: A Lot Help needed moving from lying on your back to sitting on the side of a flat bed without using bedrails?: A Lot Help needed moving to and from a bed to a chair (including a wheelchair)?: A Lot Help needed standing up from a chair using your arms (e.g., wheelchair or bedside chair)?: A Lot Help needed to walk in hospital room?: A Lot Help needed climbing 3-5 steps with a railing? : Total 6 Click Score: 11    End of Session Equipment Utilized During Treatment: Gait belt Activity Tolerance: Patient tolerated treatment well Patient left: in chair;with call bell/phone within reach;with chair alarm set   PT Visit Diagnosis: Other abnormalities of gait and mobility (R26.89);Muscle weakness (generalized) (M62.81)     Time: 1030-1051 PT Time Calculation (min) (ACUTE ONLY): 21 min  Charges:    $Gait Training: 8-22 mins PT General Charges $$ ACUTE PT VISIT: 1 Visit                         Dannial SQUIBB, PT Acute Rehabilitation  Office: [phone]

## 2024-09-01 NOTE — Progress Notes (Signed)
 " PROGRESS NOTE    Cody Silva  FMW:[id number] DOB: 1945/12/04 DOA: 08/25/2024 PCP: [facility]    Brief Narrative:  78 y.o. male with medical history significant of CVA, brain impairment, CKD stage V has previously declined dialysis, type 2 diabetes, hypertension, ascending thoracic aortic aneurysm, depression, gout, OSA, orthostatic hypotension, recurrent syncope, pancytopenia, blindness presenting with complaints of severe right sided abdominal/flank pain and vomiting.  Symptoms started over 24 hours ago.  Patient was found to have BPH with obstructive uropathy/nephropathy/AKI. Underwent cystoscopy by urology on 12/23 with laser lithotripsy, pyelogram, TURP, stone removal and stent placement.  Hematuria has improved after repeat cystoscopy on 12/25 but creatinine slowly rising.  Has been having some disequilibrium, MRI brain obtain negative for acute finding.   Assessment & Plan: Obstructive urolithiasis AKI on CKD stage V -Patient was found to have BPH with obstructive uropathy/nephropathy/AKI. cystoscopy by urology on -12/23 with laser lithotripsy, pyelogram, TURP, stone removal and stent placement.  But hematuria persisted despite of CBI therefore repeat cystoscopy with clot evacuation, TURP, left sided retrograde pyelogram was performed on 12/25. - Off CBI - Baseline creatinine 4.5, admission creatinine 5.5 > 6.23---5 -Eventually stent removal in 2 weeks -Keep IV fluids for another 24 hours---cr down to 5.0--from 5.70 -Plan to remove foley when cr close to baseline.  -On IV ceftriaxone , day 7, will discussed with urology length of treatment.   Delirium:  -he has been having some visual hallucination and confusion  at time.  He had had negative MRI B 12 normal. Ammonia normal.  Suspect related to acute illness.  Delirium precaution.   Disequilibrium History of CVA Patient has some spatial awareness issues with just started.  Plavix  is currently on hold due to  hematuria.  Continuing Lipitor -MRI negative    Chronic normocytic anemia Acute blood loss anemia Hemoglobin lower than 7, required 2 units of PRBC, hemoglobin stable around 8.5 Monitor hb  Hypertensive urgency, improved Essential hypertension Continue Norvasc  and clonidine .    Diet controlled diabetes Hemoglobin A1c 4.5 in July 2025.   Mild chronic thrombocytopenia Platelet count stable, monitor labs.   Depression Continue Lexapro .   Chronic pain/neuropathy Continue gabapentin .   Incidental finding of subacute lumbar compression fracture on CT CT showing mild anterior wedging of the L2 vertebral body with increased patchy sclerosis, possibly representing healing subacute compression fracture but underlying metastasis is not excluded.  Patient is not endorsing lumbar back pain at this time and no falls reported.  Radiologist recommending bone scintigraphy or MRI.  Patient will need close outpatient follow-up with PCP for further workup.  DVT prophylaxis: SCDs Start: 08/26/24 0511    Code Status: Full Code Family Communication:  family at bedside Status is: Inpatient Remains inpatient appropriate because: Ongoing management for hematuria & AKI and pending MRI   PT Follow up Recs: Skilled Nursing-Short Term Rehab (<3 Hours/Day)08/29/2024 1211  Subjective: He alert, has been seen his son who is deceased.  He denies pain. He is alert and oriented times 3 today. Orientation fluctuates.  He is very week,. Will need rehab. .   Examination:  General exam: NAD Respiratory system: CTA Cardiovascular system: S 1, S 2 RRR Gastrointestinal system: BS present, soft, nt Central nervous system: nad Extremities: no edema   Diet Orders (From admission, onward)     Start     Ordered   08/28/24 1108  Diet renal/carb modified with fluid restriction Diet-HS Snack? Nothing; Fluid restriction: 1200 mL Fluid; Room service appropriate? Yes; Fluid consistency:  Thin  Diet effective now        Question Answer Comment  Diet-HS Snack? Nothing   Fluid restriction: 1200 mL Fluid   Room service appropriate? Yes   Fluid consistency: Thin      08/28/24 1107            Objective: Vitals:   08/31/24 2130 08/31/24 2131 09/01/24 0411 09/01/24 0412  BP:  (!) 156/87 (!) 165/88 (!) 159/85  Pulse:  81 83 81  Resp:  18 18   Temp: 98.5 F (36.9 C)  98.3 F (36.8 C)   TempSrc: Oral  Oral   SpO2:  100% 100% 99%  Weight:      Height:        Intake/Output Summary (Last 24 hours) at 09/01/2024 0739 Last data filed at 09/01/2024 0700 Gross per 24 hour  Intake 1942.92 ml  Output 4700 ml  Net -2757.08 ml   Filed Weights   08/25/24 1642 08/26/24 1625  Weight: 68.9 kg 69.3 kg    Scheduled Meds:  sodium chloride    Intravenous Once   amLODipine   10 mg Oral QHS   atorvastatin   40 mg Oral QHS   calcitRIOL   0.25 mcg Oral Daily   Chlorhexidine  Gluconate Cloth  6 each Topical Daily   cloNIDine   0.1 mg Transdermal Once   dorzolamide -timolol   1 drop Both Eyes BID   escitalopram   10 mg Oral QHS   ferrous sulfate   325 mg Oral Q M,W,F   gabapentin   300 mg Oral QHS   latanoprost   1 drop Both Eyes QHS   Continuous Infusions:  cefTRIAXone  (ROCEPHIN )  IV 1 g (08/31/24 0819)   lactated ringers  75 mL/hr at 09/01/24 0010   sodium chloride  irrigation      Nutritional status     Body mass index is 18.6 kg/m.  Data Reviewed:   CBC: Recent Labs  Lab 08/28/24 1743 08/29/24 0443 08/30/24 0534 08/31/24 0544 09/01/24 0514  WBC 14.7* 12.3* 8.2 5.9 6.4  NEUTROABS 13.7* 10.6* 6.4 4.1 4.7  HGB 10.3* 8.7* 8.5* 8.2* 8.2*  HCT 31.1* 26.3* 24.9* 24.6* 24.8*  MCV 90.9 90.1 90.5 90.1 90.2  PLT 105* 121* 129* 128* 139*   Basic Metabolic Panel: Recent Labs  Lab 08/28/24 0517 08/29/24 0443 08/30/24 0534 08/31/24 0544 09/01/24 0514  NA 145 145 142 140 141  K 3.9 4.4 3.8 3.4* 3.4*  CL 115* 114* 111 109 112*  CO2 17* 18* 19* 21* 20*  GLUCOSE 110* 111* 90 119* 92  BUN 48* 63*  66* 52* 54*  CREATININE 5.51* 5.80* 6.23* 5.70* 5.04*  CALCIUM  9.0 9.1 8.7* 8.8* 8.8*  MG 1.9  --   --   --   --   PHOS 4.8*  --   --   --   --    GFR: Estimated Creatinine Clearance: 12 mL/min (A) (by C-G formula based on SCr of 5.04 mg/dL (H)). Liver Function Tests: Recent Labs  Lab 08/25/24 1648  AST 16  ALT 6  ALKPHOS 68  BILITOT 0.6  PROT 7.0  ALBUMIN 4.2   Recent Labs  Lab 08/25/24 1648  LIPASE 25   No results for input(s): AMMONIA in the last 168 hours. Coagulation Profile: No results for input(s): INR, PROTIME in the last 168 hours. Cardiac Enzymes: No results for input(s): CKTOTAL, CKMB, CKMBINDEX, TROPONINI in the last 168 hours. BNP (last 3 results) No results for input(s): PROBNP in the last 8760 hours. HbA1C: No results for input(s): HGBA1C  in the last 72 hours. CBG: Recent Labs  Lab 08/26/24 0947 08/26/24 1210 08/26/24 1245 08/28/24 1109  GLUCAP 82 76 127* 97   Lipid Profile: No results for input(s): CHOL, HDL, LDLCALC, TRIG, CHOLHDL, LDLDIRECT in the last 72 hours. Thyroid  Function Tests: No results for input(s): TSH, T4TOTAL, FREET4, T3FREE, THYROIDAB in the last 72 hours. Anemia Panel: No results for input(s): VITAMINB12, FOLATE, FERRITIN, TIBC, IRON, RETICCTPCT in the last 72 hours. Sepsis Labs: Recent Labs  Lab 08/26/24 0840 08/26/24 1703  LATICACIDVEN 0.8 1.0    Recent Results (from the past 240 hours)  Urine Culture (for pregnant, neutropenic or urologic patients or patients with an indwelling urinary catheter)     Status: None   Collection Time: 08/26/24  8:40 AM   Specimen: Urine, Clean Catch  Result Value Ref Range Status   Specimen Description URINE, CLEAN CATCH  Final   Special Requests NONE  Final   Culture   Final    NO GROWTH Performed at [facility], [address], [place], [place] [address]    Report Status 08/27/2024 FINAL  Final         Radiology  Studies: MR BRAIN WO CONTRAST Result Date: 08/31/2024 EXAM: MRI BRAIN WITHOUT CONTRAST 08/30/2024 12:57:50 PM TECHNIQUE: Multiplanar multisequence MRI of the head/brain was performed without the administration of intravenous contrast. COMPARISON: MR Head 03/26/2024. CLINICAL HISTORY: Mental status change, unknown cause. FINDINGS: BRAIN AND VENTRICLES: No acute infarct. No acute intracranial hemorrhage. No mass. No midline shift. No hydrocephalus. Similar moderate chronic microvascular ischemic change and cerebral atrophy. Similar scattered chronic cerebral microhemorrhages, including in both thalami and likely the sequelae of chronic hypertension. Normal flow voids. ORBITS: No acute abnormality. SINUSES AND MASTOIDS: No acute abnormality. BONES AND SOFT TISSUES: Normal marrow signal. No acute soft tissue abnormality. IMPRESSION: 1. No acute intracranial abnormality. Electronically signed by: Gilmore Molt 08/31/2024 02:09 AM EST RP Workstation: [id number]           LOS: 6 days   Time spent= 35 mins    Owen DELENA Lore, MD [facility]  If 7PM-7AM, please contact night-coverage  09/01/2024, 7:39 AM  "

## 2024-09-01 NOTE — NC FL2 (Signed)
 " [place]  MEDICAID FL2 LEVEL OF CARE FORM     IDENTIFICATION  Patient Name: Cody Silva Birthdate: 09-16-1945 Sex: male Admission Date (Current Location): 08/25/2024  [facility] and [place] Number:  Producer, Television/film/video and Address:  [facility],  [address] [place], [place] [address]      Provider Number: [id number]  Attending Physician Name and Address:  Madelyne Owen LABOR, MD  Relative Name and Phone Number:  Dorothe Bottoms(spouse)#[phone]    Current Level of Care: Hospital Recommended Level of Care: Skilled Nursing Facility Prior Approval Number:    Date Approved/Denied:   PASRR Number: [id number]  Discharge Plan: SNF    Current Diagnoses: 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   Achilles tendon contracture, bilateral 02/07/2018   Right foot ulcer, limited to breakdown of skin (HCC) 02/07/2018   Memory loss 02/06/2018   Gait abnormality 02/06/2018   Diabetic neuropathy (HCC) 02/04/2018   Type II diabetes mellitus with foot ulcer (HCC) 12/05/2017   Diabetic foot infection (HCC)    Fever 12/04/2017   Fever and chills 12/04/2017   Hx of bacteremia    Wound eschar of foot    Acute sepsis (HCC) 11/12/2017   Scrotal pain    Sepsis (HCC) 11/11/2017   Atrophy of muscle of right hand 04/04/2017   Protein-calorie malnutrition, severe 01/19/2017   Altered mental status 01/09/2017   Facial droop 01/09/2017   Cerebral thrombosis with cerebral infarction 01/09/2017   Seizures (HCC)    Other hyperlipidemia    OSA on  CPAP 09/12/2016   Weight loss 09/12/2016   Malnutrition of moderate degree 09/11/2016   Syncope 09/10/2016   H/O agent Orange exposure 12/24/2015   Type 2 diabetes with nephropathy (HCC)    Near syncope 12/22/2015   History of TIAs 09/14/2015   CKD stage 3 due to type 2 diabetes mellitus (HCC) 09/14/2015   Acute lower GI bleeding 10/30/2014   History of colonic polyps 10/30/2014   Depression 10/30/2014   Symptomatic anemia 10/30/2014   Acute renal failure superimposed on stage 3b chronic kidney disease (HCC) 10/30/2014   Ataxic gait 09/21/2014   History of CVA (cerebrovascular accident) 02/22/2013   Abnormal brain scan 02/21/2013   Dizziness 02/21/2013   Weakness 02/21/2013   Diabetes mellitus (HCC) 02/21/2013   Hypertension associated with diabetes (HCC) 02/21/2013    Orientation RESPIRATION BLADDER Height & Weight     Self, Time, Situation, Place    Continent Weight: 69.3 kg Height:  6' 4 (193 cm)  BEHAVIORAL SYMPTOMS/MOOD NEUROLOGICAL BOWEL NUTRITION STATUS      Continent Diet (renal  fluid restriction)  AMBULATORY STATUS COMMUNICATION OF NEEDS Skin   Limited Assist Verbally Normal                       Personal Care Assistance Level of Assistance  Bathing, Feeding, Dressing Bathing Assistance: Limited assistance Feeding assistance: Limited assistance Dressing Assistance: Limited assistance     Functional Limitations Info  Sight, Hearing, Speech Sight Info: Impaired (legally blind)   Speech Info: Adequate  SPECIAL CARE FACTORS FREQUENCY  PT (By licensed PT), OT (By licensed OT)     PT Frequency: 5x week OT Frequency: 5x week            Contractures Contractures Info: Not present    Additional Factors Info  Code Status, Allergies Code Status Info: Full Allergies Info: Metformin And Related, Felodipine, Lactose Intolerance (Gi), Pseudoephedrine-acetaminophen , Terazosin, Tylenol  With Codeine #3 (Acetaminophen -codeine), Tape            Current Medications (09/01/2024):  This is the current hospital active medication list Current Facility-Administered Medications  Medication Dose Route Frequency Provider Last Rate Last Admin   0.9 %  sodium chloride  infusion (Manually program via Guardrails IV Fluids)   Intravenous Once Shane Steffan BROCKS, MD       acetaminophen  (TYLENOL ) tablet 650 mg  650 mg Oral Q6H PRN Rathore, Vasundhra, MD   650 mg at 08/30/24 2126   Or   acetaminophen  (TYLENOL ) suppository 650 mg  650 mg Rectal Q6H PRN Alfornia Madison, MD       amLODipine  (NORVASC ) tablet 10 mg  10 mg Oral QHS Amin, Ankit C, MD   10 mg at 08/31/24 2138   atorvastatin  (LIPITOR) tablet 40 mg  40 mg Oral QHS Rathore, Vasundhra, MD   40 mg at 08/31/24 2138   calcitRIOL  (ROCALTROL ) capsule 0.25 mcg  0.25 mcg Oral Daily Amin, Ankit C, MD   0.25 mcg at 09/01/24 0914   cefTRIAXone  (ROCEPHIN ) 1 g in sodium chloride  0.9 % 100 mL IVPB  1 g Intravenous Q24H Samtani, Jai-Gurmukh, MD 200 mL/hr at 09/01/24 0927 1 g at 09/01/24 9072   Chlorhexidine  Gluconate Cloth 2 % PADS 6 each  6 each Topical Daily Samtani, Jai-Gurmukh, MD   6 each at 09/01/24 9077   cloNIDine  (CATAPRES  - Dosed in mg/24 hr) patch 0.1 mg  0.1 mg Transdermal Once Griselda Norris, MD   0.1 mg at 08/26/24 0128   dorzolamide -timolol  (COSOPT ) 2-0.5 % ophthalmic solution 1 drop  1 drop Both Eyes BID Rathore, Vasundhra, MD   1 drop at 09/01/24 9077   escitalopram  (LEXAPRO ) tablet 10 mg  10 mg Oral QHS Rathore, Vasundhra, MD   10 mg at 08/31/24 2138   feeding supplement (NEPRO CARB STEADY) liquid 237 mL  237 mL Oral BID BM Regalado, Belkys A, MD       ferrous sulfate  tablet 325 mg  325 mg Oral Q M,W,F Amin, Ankit C, MD   325 mg at 09/01/24 9085   gabapentin  (NEURONTIN ) capsule 300 mg  300 mg Oral QHS Rathore, Vasundhra, MD   300 mg at 08/31/24 2138   glucagon  (human recombinant) (GLUCAGEN) injection 1 mg  1 mg Intravenous PRN Amin, Ankit C, MD       hydrALAZINE  (APRESOLINE ) injection 10  mg  10 mg Intravenous Q4H PRN Amin, Ankit C, MD   10 mg at 08/27/24 2120   HYDROmorphone  (DILAUDID ) injection 0.5 mg  0.5 mg Intravenous Q4H PRN Rathore, Vasundhra, MD   0.5 mg at 08/27/24 0106   ipratropium-albuterol  (DUONEB) 0.5-2.5 (3) MG/3ML nebulizer solution 3 mL  3 mL Nebulization Q4H PRN Amin, Ankit C, MD       lactated ringers  infusion   Intravenous Continuous Regalado, Belkys A, MD 75 mL/hr at 09/01/24 1354 New Bag at 09/01/24 1354   latanoprost  (XALATAN ) 0.005 % ophthalmic solution 1 drop  1 drop Both Eyes QHS Rathore, Vasundhra, MD   1 drop at 08/31/24 2140   metoprolol  tartrate (LOPRESSOR ) injection  5 mg  5 mg Intravenous Q4H PRN Amin, Ankit C, MD       naloxone  (NARCAN ) injection 0.4 mg  0.4 mg Intravenous PRN Rathore, Vasundhra, MD       ondansetron  (ZOFRAN ) injection 4 mg  4 mg Intravenous Q6H PRN Rathore, Vasundhra, MD       Oral care mouth rinse  15 mL Mouth Rinse PRN Regalado, Belkys A, MD       sodium chloride  irrigation 0.9 % 3,000 mL  3,000 mL Irrigation Continuous Showalter, Victor C, MD   3,000 mL at 08/28/24 0202   thiamine  (VITAMIN B1) tablet 100 mg  100 mg Oral Daily Regalado, Belkys A, MD         Discharge Medications: Please see discharge summary for a list of discharge medications.  Relevant Imaging Results:  Relevant Lab Results:   Additional Information ss#[id number]  Dayyan Krist, [name], RN     "

## 2024-09-01 NOTE — Progress Notes (Signed)
 Patient weak and unable to stand to get out of chair back to bed, patient transferred to bed by sliding in supine position from chair with 4 assist

## 2024-09-01 NOTE — Progress Notes (Signed)
" ° °  4 Days Post-Op Subjective: Cody Silva was resting in bed.  We briefly reviewed his labs for the day, which he was encouraged by.  No acute events overnight.  Objective: Vital signs in last 24 hours: Temp:  [98.3 F (36.8 C)-98.9 F (37.2 C)] 98.3 F (36.8 C) (12/29 0411) Pulse Rate:  [73-83] 81 (12/29 0412) Resp:  [18] 18 (12/29 0411) BP: (138-165)/(71-88) 159/85 (12/29 0412) SpO2:  [99 %-100 %] 99 % (12/29 0412)  Assessment/Plan: # S/p TURP with left ureteral stent  Urine clear on rounds Interval improvement in serum creatinine 6.23->5.7->5.0 Will continue to monitor peripherally.  Tethered stent attached to Foley catheter and will come out with Foley removal.  Reasonable to allow his renal function to continue to improve for a while longer before voiding trial.  Intake/Output from previous day: 12/28 0701 - 12/29 0700 In: 1942.9 [P.O.:480; I.V.:1462.9] Out: 4700 [Urine:4700]  Intake/Output this shift: Total I/O In: -  Out: 300 [Urine:300]  Physical Exam:  General: Alert and oriented CV: No cyanosis Lungs: equal chest rise Gu: Foley catheter with tethered ureteral stent.  Draining clear yellow urine  Lab Results: Recent Labs    08/30/24 0534 08/31/24 0544 09/01/24 0514  HGB 8.5* 8.2* 8.2*  HCT 24.9* 24.6* 24.8*   BMET Recent Labs    08/31/24 0544 09/01/24 0514  NA 140 141  K 3.4* 3.4*  CL 109 112*  CO2 21* 20*  GLUCOSE 119* 92  BUN 52* 54*  CREATININE 5.70* 5.04*  CALCIUM  8.8* 8.8*  HGB 8.2* 8.2*  WBC 5.9 6.4     Studies/Results: MR BRAIN WO CONTRAST Result Date: 08/31/2024 EXAM: MRI BRAIN WITHOUT CONTRAST 08/30/2024 12:57:50 PM TECHNIQUE: Multiplanar multisequence MRI of the head/brain was performed without the administration of intravenous contrast. COMPARISON: MR Head 03/26/2024. CLINICAL HISTORY: Mental status change, unknown cause. FINDINGS: BRAIN AND VENTRICLES: No acute infarct. No acute intracranial hemorrhage. No mass. No midline  shift. No hydrocephalus. Similar moderate chronic microvascular ischemic change and cerebral atrophy. Similar scattered chronic cerebral microhemorrhages, including in both thalami and likely the sequelae of chronic hypertension. Normal flow voids. ORBITS: No acute abnormality. SINUSES AND MASTOIDS: No acute abnormality. BONES AND SOFT TISSUES: Normal marrow signal. No acute soft tissue abnormality. IMPRESSION: 1. No acute intracranial abnormality. Electronically signed by: Gilmore Molt 08/31/2024 02:09 AM EST RP Workstation: [id number]      LOS: 6 days   Ole Bourdon, NP [facility] Pager: [phone]  09/01/2024, 8:51 AM  "

## 2024-09-02 DIAGNOSIS — N179 Acute kidney failure, unspecified: Secondary | ICD-10-CM | POA: Diagnosis not present

## 2024-09-02 DIAGNOSIS — N189 Chronic kidney disease, unspecified: Secondary | ICD-10-CM | POA: Diagnosis not present

## 2024-09-02 LAB — BASIC METABOLIC PANEL WITH GFR
Anion gap: 9 (ref 5–15)
BUN: 49 mg/dL — ABNORMAL HIGH (ref 8–23)
CO2: 22 mmol/L (ref 22–32)
Calcium: 9.1 mg/dL (ref 8.9–10.3)
Chloride: 111 mmol/L (ref 98–111)
Creatinine, Ser: 4.56 mg/dL — ABNORMAL HIGH (ref 0.61–1.24)
GFR, Estimated: 13 mL/min — ABNORMAL LOW
Glucose, Bld: 80 mg/dL (ref 70–99)
Potassium: 3.3 mmol/L — ABNORMAL LOW (ref 3.5–5.1)
Sodium: 141 mmol/L (ref 135–145)

## 2024-09-02 LAB — CBC
HCT: 24 % — ABNORMAL LOW (ref 39.0–52.0)
Hemoglobin: 8.1 g/dL — ABNORMAL LOW (ref 13.0–17.0)
MCH: 30.2 pg (ref 26.0–34.0)
MCHC: 33.8 g/dL (ref 30.0–36.0)
MCV: 89.6 fL (ref 80.0–100.0)
Platelets: 151 K/uL (ref 150–400)
RBC: 2.68 MIL/uL — ABNORMAL LOW (ref 4.22–5.81)
RDW: 13.9 % (ref 11.5–15.5)
WBC: 6.9 K/uL (ref 4.0–10.5)
nRBC: 0 % (ref 0.0–0.2)

## 2024-09-02 LAB — GLUCOSE, CAPILLARY: Glucose-Capillary: 81 mg/dL (ref 70–99)

## 2024-09-02 MED ORDER — POTASSIUM CHLORIDE CRYS ER 10 MEQ PO TBCR
10.0000 meq | EXTENDED_RELEASE_TABLET | Freq: Once | ORAL | Status: AC
  Administered 2024-09-02: 10 meq via ORAL
  Filled 2024-09-02: qty 1

## 2024-09-02 MED ORDER — CLONIDINE HCL 0.1 MG/24HR TD PTWK
0.1000 mg | MEDICATED_PATCH | Freq: Once | TRANSDERMAL | Status: AC
  Administered 2024-09-02: 0.1 mg via TRANSDERMAL
  Filled 2024-09-02: qty 1

## 2024-09-02 MED ORDER — TAMSULOSIN HCL 0.4 MG PO CAPS
0.4000 mg | ORAL_CAPSULE | Freq: Every day | ORAL | Status: DC
  Administered 2024-09-02 – 2024-09-10 (×9): 0.4 mg via ORAL
  Filled 2024-09-02 (×9): qty 1

## 2024-09-02 NOTE — TOC Progression Note (Signed)
 Transition of Care [facility]) - Progression Note    Patient Details  Name: Cody Silva MRN: [id number] Date of Birth: Jan 08, 1946  [facility] of [facility]) CM/SW Contact  Cody Silva, [name], RN Phone Number: 09/02/2024, 11:41 AM  Clinical Narrative:   Await choice, then auth.        Service Provider Request Status Stars Address Phone  [facility] Preferred SNF  Accepted 2 [address] [place] [address] [phone]  [facility]  Accepted 2 [address] [place] [address] [phone]  [facility] Preferred SNF  Accepted 5 [address], [place] [address] [phone]  [facility] SN[facility]  Accepted 1 [address] [place] [place] [address] [phone]  [facility]  Accepted 1 [address] [place] [address] [phone]  [facility]  Accepted 2 [address] [place] [address] [phone]  [facility] SN[facility]  Accepted 3 [address] [place] [address] [phone]  [facility]  Accepted 2 [address], [place] [address] [phone]  [facility] [place] Preferred SNF  Accepted 4 [address] [place] [address] [phone]  [facility] SN[facility]  Accepted 2 [address] [place] [address] [phone]            Expected Discharge Plan: Skilled Nursing Facility Barriers to Discharge: Continued Medical Work up               Expected Discharge Plan and Services   Discharge Planning Services: CM Consult Post Acute Care Choice: Skilled Nursing Facility Living arrangements for the past 2 months: Single Family Home                                       Social Drivers of Health (SDOH) Interventions SDOH Screenings   Food Insecurity: No Food Insecurity (08/26/2024)  Housing: Low Risk (08/26/2024)  Transportation Needs: No Transportation Needs  (08/26/2024)  Utilities: Not At Risk (08/26/2024)  Social Connections: Moderately Integrated (08/26/2024)  Tobacco Use: Low Risk (08/26/2024)    Readmission Risk Interventions    03/26/2024   10:14 AM  Readmission Risk Prevention Plan  Transportation Screening Complete  PCP or Specialist Appt within 5-7 Days Complete  Home Care Screening Complete  Medication Review (RN CM) Complete

## 2024-09-02 NOTE — Progress Notes (Signed)
 " PROGRESS NOTE    Cody Silva  FMW:[id number] DOB: 09-16-1945 DOA: 08/25/2024 PCP: [facility]    Brief Narrative:  78 y.o. male with medical history significant of CVA, brain impairment, CKD stage V has previously declined dialysis, type 2 diabetes, hypertension, ascending thoracic aortic aneurysm, depression, gout, OSA, orthostatic hypotension, recurrent syncope, pancytopenia, blindness presenting with complaints of severe right sided abdominal/flank pain and vomiting.  Symptoms started over 24 hours ago.  Patient was found to have BPH with obstructive uropathy/nephropathy/AKI. Underwent cystoscopy by urology on 12/23 with laser lithotripsy, pyelogram, TURP, stone removal and stent placement.  Hematuria has improved after repeat cystoscopy on 12/25 but creatinine slowly rising.  Has been having some disequilibrium, MRI brain obtain negative for acute finding.   Assessment & Plan: 1-Obstructive urolithiasis AKI on CKD stage V -Patient was found to have BPH with obstructive uropathy/nephropathy/AKI. cystoscopy by urology on -12/23 with laser lithotripsy, pyelogram, TURP, stone removal and stent placement.  But hematuria persisted despite of CBI therefore repeat cystoscopy with clot evacuation, TURP, left sided retrograde pyelogram was performed on 12/25. - Off CBI - Baseline creatinine 4.5, admission creatinine 5.5 > 6.23---5--4.5 -renal function improved with IV fluids.  -Plan to remove foley after couples days after staring Flomax  (started 12/30) , dc foley prior to discharge for voiding trial, per urology stent will come off with foley.  -Received IV ceftriaxone  for 7 days, discussed with urology no further antibiotics needed.   2-Delirium:  -he has been having some visual hallucination and confusion  at time.  He had had negative MRI B 12 normal. Ammonia normal.  Suspect related to acute illness.  Delirium precaution.   Disequilibrium History of CVA Patient has  some spatial awareness issues with just started.  Plavix  is currently on hold due to hematuria.  Continuing Lipitor -MRI negative    Chronic normocytic anemia Acute blood loss anemia Hemoglobin lower than 7, required 2 units of PRBC, hemoglobin stable around 8.5 Monitor hb  Hypertensive urgency, improved Essential hypertension Continue Norvasc  and clonidine .    Diet controlled diabetes Hemoglobin A1c 4.5 in July 2025.   Mild chronic thrombocytopenia Platelet count stable, monitor labs.   Depression Continue Lexapro .   Chronic pain/neuropathy Continue gabapentin .   Incidental finding of subacute lumbar compression fracture on CT CT showing mild anterior wedging of the L2 vertebral body with increased patchy sclerosis, possibly representing healing subacute compression fracture but underlying metastasis is not excluded.  Patient is not endorsing lumbar back pain at this time and no falls reported.  Radiologist recommending bone scintigraphy or MRI.  Patient will need close outpatient follow-up with PCP for further workup.  DVT prophylaxis: SCDs Start: 08/26/24 0511    Code Status: Full Code Family Communication:  family at bedside Status is: Inpatient Remains inpatient appropriate because: Ongoing management for hematuria & AKI and pending MRI   PT Follow up Recs: Skilled Nursing-Short Term Rehab (<3 Hours/Day)08/29/2024 1211  Subjective: He is sleepy, would say few words. He fall back to sleep. He is snoring.   Examination:  General exam: NAD Respiratory system; CTA Cardiovascular system: S 1, S 2 RRR Gastrointestinal system: BS present, soft, nt Central nervous system: NAD Extremities: No edema   Diet Orders (From admission, onward)     Start     Ordered   08/28/24 1108  Diet renal/carb modified with fluid restriction Diet-HS Snack? Nothing; Fluid restriction: 1200 mL Fluid; Room service appropriate? Yes; Fluid consistency: Thin  Diet effective now  Question Answer Comment  Diet-HS Snack? Nothing   Fluid restriction: 1200 mL Fluid   Room service appropriate? Yes   Fluid consistency: Thin      08/28/24 1107            Objective: Vitals:   09/01/24 1254 09/01/24 1927 09/01/24 1931 09/02/24 0304  BP: (!) 159/73 (!) 176/78 (!) 167/108 (!) 159/76  Pulse: 70 73  72  Resp: 18 19  17   Temp: 97.6 F (36.4 C) 97.7 F (36.5 C)  98.3 F (36.8 C)  TempSrc: Oral Oral  Oral  SpO2: 100% 100%  100%  Weight:      Height:        Intake/Output Summary (Last 24 hours) at 09/02/2024 1057 Last data filed at 09/02/2024 0851 Gross per 24 hour  Intake 1797.48 ml  Output 1300 ml  Net 497.48 ml   Filed Weights   08/25/24 1642 08/26/24 1625  Weight: 68.9 kg 69.3 kg    Scheduled Meds:  sodium chloride    Intravenous Once   amLODipine   10 mg Oral QHS   atorvastatin   40 mg Oral QHS   calcitRIOL   0.25 mcg Oral Daily   Chlorhexidine  Gluconate Cloth  6 each Topical Daily   dorzolamide -timolol   1 drop Both Eyes BID   escitalopram   10 mg Oral QHS   feeding supplement (NEPRO CARB STEADY)  237 mL Oral BID BM   ferrous sulfate   325 mg Oral Q M,W,F   gabapentin   300 mg Oral QHS   latanoprost   1 drop Both Eyes QHS   tamsulosin   0.4 mg Oral Daily   thiamine   100 mg Oral Daily   Continuous Infusions:  lactated ringers  75 mL/hr at 09/02/24 0214   sodium chloride  irrigation      Nutritional status     Body mass index is 18.6 kg/m.  Data Reviewed:   CBC: Recent Labs  Lab 08/28/24 1743 08/29/24 0443 08/30/24 0534 08/31/24 0544 09/01/24 0514 09/02/24 0516  WBC 14.7* 12.3* 8.2 5.9 6.4 6.9  NEUTROABS 13.7* 10.6* 6.4 4.1 4.7  --   HGB 10.3* 8.7* 8.5* 8.2* 8.2* 8.1*  HCT 31.1* 26.3* 24.9* 24.6* 24.8* 24.0*  MCV 90.9 90.1 90.5 90.1 90.2 89.6  PLT 105* 121* 129* 128* 139* 151   Basic Metabolic Panel: Recent Labs  Lab 08/28/24 0517 08/29/24 0443 08/30/24 0534 08/31/24 0544 09/01/24 0514 09/02/24 0516  NA 145 145 142 140  141 141  K 3.9 4.4 3.8 3.4* 3.4* 3.3*  CL 115* 114* 111 109 112* 111  CO2 17* 18* 19* 21* 20* 22  GLUCOSE 110* 111* 90 119* 92 80  BUN 48* 63* 66* 52* 54* 49*  CREATININE 5.51* 5.80* 6.23* 5.70* 5.04* 4.56*  CALCIUM  9.0 9.1 8.7* 8.8* 8.8* 9.1  MG 1.9  --   --   --   --   --   PHOS 4.8*  --   --   --   --   --    GFR: Estimated Creatinine Clearance: 13.3 mL/min (A) (by C-G formula based on SCr of 4.56 mg/dL (H)). Liver Function Tests: No results for input(s): AST, ALT, ALKPHOS, BILITOT, PROT, ALBUMIN in the last 168 hours.  No results for input(s): LIPASE, AMYLASE in the last 168 hours.  Recent Labs  Lab 09/01/24 1205  AMMONIA 15   Coagulation Profile: No results for input(s): INR, PROTIME in the last 168 hours. Cardiac Enzymes: No results for input(s): CKTOTAL, CKMB, CKMBINDEX, TROPONINI in the last 168  hours. BNP (last 3 results) No results for input(s): PROBNP in the last 8760 hours. HbA1C: No results for input(s): HGBA1C in the last 72 hours. CBG: Recent Labs  Lab 08/26/24 1210 08/26/24 1245 08/28/24 1109  GLUCAP 76 127* 97   Lipid Profile: No results for input(s): CHOL, HDL, LDLCALC, TRIG, CHOLHDL, LDLDIRECT in the last 72 hours. Thyroid  Function Tests: No results for input(s): TSH, T4TOTAL, FREET4, T3FREE, THYROIDAB in the last 72 hours. Anemia Panel: Recent Labs    09/01/24 1205  VITAMINB12 813   Sepsis Labs: Recent Labs  Lab 08/26/24 1703  LATICACIDVEN 1.0    Recent Results (from the past 240 hours)  Urine Culture (for pregnant, neutropenic or urologic patients or patients with an indwelling urinary catheter)     Status: None   Collection Time: 08/26/24  8:40 AM   Specimen: Urine, Clean Catch  Result Value Ref Range Status   Specimen Description URINE, CLEAN CATCH  Final   Special Requests NONE  Final   Culture   Final    NO GROWTH Performed at [facility], [address]  [place], [place] [address]    Report Status 08/27/2024 FINAL  Final         Radiology Studies: No results found.          LOS: 7 days   Time spent= 35 mins    Owen DELENA Lore, MD [facility]  If 7PM-7AM, please contact night-coverage  09/02/2024, 10:57 AM  "

## 2024-09-02 NOTE — Progress Notes (Signed)
" ° °  5 Days Post-Op Subjective: Mr. Cody Silva sleeping on rounds.  I did not wake him.  Objective: Vital signs in last 24 hours: Temp:  [97.6 F (36.4 C)-98.5 F (36.9 C)] 98.5 F (36.9 C) (12/30 1201) Pulse Rate:  [70-85] 85 (12/30 1201) Resp:  [17-19] 17 (12/30 1201) BP: (159-176)/(73-108) 169/88 (12/30 1201) SpO2:  [100 %] 100 % (12/30 1201)  Assessment/Plan: # S/p TURP with left ureteral stent  Urine clear on rounds Interval improvement in serum creatinine 6.23->5.7->5.0->4.56 Will continue to monitor peripherally.  Tethered stent attached to Foley catheter and will come out with Foley removal.  Reasonable to allow his renal function to continue to improve for a while longer before voiding trial.  Intake/Output from previous day: 12/29 0701 - 12/30 0700 In: 1797.5 [P.O.:120; I.V.:1265.7; IV Piggyback:411.8] Out: 1900 [Urine:1900]  Intake/Output this shift: No intake/output data recorded.  Physical Exam:  General: Alert and oriented CV: No cyanosis Lungs: equal chest rise Gu: Foley catheter with tethered ureteral stent.  Draining clear yellow urine  Lab Results: Recent Labs    08/31/24 0544 09/01/24 0514 09/02/24 0516  HGB 8.2* 8.2* 8.1*  HCT 24.6* 24.8* 24.0*   BMET Recent Labs    09/01/24 0514 09/02/24 0516  NA 141 141  K 3.4* 3.3*  CL 112* 111  CO2 20* 22  GLUCOSE 92 80  BUN 54* 49*  CREATININE 5.04* 4.56*  CALCIUM  8.8* 9.1  HGB 8.2* 8.1*  WBC 6.4 6.9     Studies/Results: No results found.     LOS: 7 days   Cody Bourdon, NP [facility] Pager: [phone]  09/02/2024, 12:22 PM  "

## 2024-09-03 ENCOUNTER — Encounter (HOSPITAL_COMMUNITY): Payer: Self-pay | Admitting: Internal Medicine

## 2024-09-03 DIAGNOSIS — R410 Disorientation, unspecified: Secondary | ICD-10-CM

## 2024-09-03 DIAGNOSIS — N179 Acute kidney failure, unspecified: Secondary | ICD-10-CM | POA: Diagnosis not present

## 2024-09-03 DIAGNOSIS — N400 Enlarged prostate without lower urinary tract symptoms: Secondary | ICD-10-CM | POA: Diagnosis not present

## 2024-09-03 DIAGNOSIS — N201 Calculus of ureter: Secondary | ICD-10-CM | POA: Diagnosis not present

## 2024-09-03 DIAGNOSIS — N185 Chronic kidney disease, stage 5: Secondary | ICD-10-CM

## 2024-09-03 DIAGNOSIS — N139 Obstructive and reflux uropathy, unspecified: Secondary | ICD-10-CM | POA: Insufficient documentation

## 2024-09-03 DIAGNOSIS — E1140 Type 2 diabetes mellitus with diabetic neuropathy, unspecified: Secondary | ICD-10-CM | POA: Diagnosis not present

## 2024-09-03 DIAGNOSIS — N189 Chronic kidney disease, unspecified: Secondary | ICD-10-CM | POA: Diagnosis not present

## 2024-09-03 LAB — BASIC METABOLIC PANEL WITH GFR
Anion gap: 8 (ref 5–15)
BUN: 49 mg/dL — ABNORMAL HIGH (ref 8–23)
CO2: 23 mmol/L (ref 22–32)
Calcium: 9.2 mg/dL (ref 8.9–10.3)
Chloride: 112 mmol/L — ABNORMAL HIGH (ref 98–111)
Creatinine, Ser: 4.76 mg/dL — ABNORMAL HIGH (ref 0.61–1.24)
GFR, Estimated: 12 mL/min — ABNORMAL LOW
Glucose, Bld: 109 mg/dL — ABNORMAL HIGH (ref 70–99)
Potassium: 3.5 mmol/L (ref 3.5–5.1)
Sodium: 143 mmol/L (ref 135–145)

## 2024-09-03 LAB — VITAMIN B1: Vitamin B1 (Thiamine): 70.9 nmol/L (ref 66.5–200.0)

## 2024-09-03 NOTE — Care Management Important Message (Signed)
 Important Message  Patient Details IM Letter given. Name: Cody Silva MRN: [id number] Date of Birth: [birth year]   Important Message Given:  Yes - Medicare IM     Melba Ates 09/03/2024, 12:43 PM

## 2024-09-03 NOTE — Progress Notes (Addendum)
" °  Progress Note   Patient: Cody Silva FMW:[id number] DOB: Jan 18, 1946 DOA: 08/25/2024     8 DOS: the patient was seen and examined on 09/03/2024   Brief hospital course:   Assessment and Plan: Obstructive urolithiasis AKI superimposed on CKD stage V BPH Obstructive uropathy, nephropathy, BPH.  Status post cystoscopy 12/23 with laser lithotripsy, TURP, stone removal and stent placement. Hematuria persisted despite CBI and so had repeat cystoscopy and clot evacuation 12/25. Now off CBI.  Renal function slowly improving.  Continue Flomax .  Completed 7 days of ceftriaxone  Will follow renal function, if stable today, plan voiding trial tomorrow BMP in a.m.  Acute delirium with hallucinations and confusion MRI was negative, B12 normal, ammonia normal Presumably secondary to acute illness.  Delirium precautions.  Chronic normocytic anemia ABLA Baseline hemoglobin around 9-10, had hematuria requiring 2 units PRBC for ABLA. Hemoglobin stable.  Hypertensive urgency Essential hypertension Stable.  Continue metoprolol , Norvasc  and clonidine .  Diabetes mellitus type 2, diet controlled Chronic pain, neuropathy Hemoglobin A1c within normal limits July 2025 Continue gabapentin   Mild chronic thrombocytopenia Stable  Incidental finding of subacute lumbar compression fracture on CT CT showed mild anterior wedging of the L2 vertebral body with increased patchy sclerosis, possibly representing healing subacute compression fracture but underlying metastasis is not excluded.   No lumbar back pain at this time and no falls reported.  Radiologist recommending bone scintigraphy or MRI.   Patient will need close outpatient follow-up with PCP for further workup.  Glaucoma Blindness Continue Cosopt , Xalatan   Disposition From: Home To: SNF    Subjective:  Feels ok, breathing fine, eating fine.  Some discomfort from catheter.  Physical Exam: Vitals:   09/02/24 0304 09/02/24 1201  09/02/24 1945 09/03/24 0430  BP: (!) 159/76 (!) 169/88 (!) 156/76 (!) 151/78  Pulse: 72 85 82 79  Resp: 17 17 18 19   Temp: 98.3 F (36.8 C) 98.5 F (36.9 C) 98.2 F (36.8 C) 97.8 F (36.6 C)  TempSrc: Oral Oral Oral Oral  SpO2: 100% 100% 98% 99%  Weight:      Height:       Physical Exam Vitals reviewed.  Constitutional:      General: He is not in acute distress.    Appearance: He is not ill-appearing or toxic-appearing.  Cardiovascular:     Rate and Rhythm: Normal rate and regular rhythm.     Heart sounds: No murmur heard. Pulmonary:     Effort: Pulmonary effort is normal. No respiratory distress.     Breath sounds: No wheezing, rhonchi or rales.  Musculoskeletal:     Right lower leg: No edema.     Left lower leg: No edema.  Neurological:     Mental Status: He is alert.  Psychiatric:        Mood and Affect: Mood normal.        Behavior: Behavior normal.     Data Reviewed: No new data, I have ordered a BMP to follow-up kidney function today  Family Communication: none present  Disposition: Status is: Inpatient Remains inpatient appropriate because: AKI     Time spent: 35 minutes  Author: Toribio Door, MD 09/03/2024 10:05 AM  For on call review [email or web address]    "

## 2024-09-03 NOTE — TOC Progression Note (Signed)
 Transition of Care [facility]) - Progression Note    Patient Details  Name: MIKAEEL PETROW MRN: [id number] Date of Birth: [birth year]  Transition of [facility]) CM/SW Contact  Fumiye Lubben, [name], RN Phone Number: 09/03/2024, 11:13 AM  Clinical Narrative: [place] [facility] for [address] rep Ladene duverney [phone] will check for bed availabilty for tomorrow-await outcome.      Expected Discharge Plan: Skilled Nursing Facility Barriers to Discharge: Continued Medical Work up               Expected Discharge Plan and Services   Discharge Planning Services: CM Consult Post Acute Care Choice: Skilled Nursing Facility Living arrangements for the past 2 months: Skilled Nursing Facility                                       Social Drivers of Health (SDOH) Interventions SDOH Screenings   Food Insecurity: No Food Insecurity (08/26/2024)  Housing: Low Risk (08/26/2024)  Transportation Needs: No Transportation Needs (08/26/2024)  Utilities: Not At Risk (08/26/2024)  Social Connections: Moderately Integrated (08/26/2024)  Tobacco Use: Low Risk (08/26/2024)    Readmission Risk Interventions    03/26/2024   10:14 AM  Readmission Risk Prevention Plan  Transportation Screening Complete  PCP or Specialist Appt within 5-7 Days Complete  Home Care Screening Complete  Medication Review (RN CM) Complete

## 2024-09-03 NOTE — Progress Notes (Signed)
 Report called and given to [facility], RN and patient transferred to room 1314. Left unit on bed pushed by this writer. Arrived in stable condition.

## 2024-09-03 NOTE — Plan of Care (Signed)
   Problem: Education: Goal: Knowledge of General Education information will improve Description: Including pain rating scale, medication(s)/side effects and non-pharmacologic comfort measures Outcome: Progressing   Problem: Clinical Measurements: Goal: Will remain free from infection Outcome: Progressing Goal: Diagnostic test results will improve Outcome: Progressing

## 2024-09-04 DIAGNOSIS — N201 Calculus of ureter: Secondary | ICD-10-CM | POA: Diagnosis not present

## 2024-09-04 DIAGNOSIS — N139 Obstructive and reflux uropathy, unspecified: Secondary | ICD-10-CM | POA: Diagnosis not present

## 2024-09-04 DIAGNOSIS — N179 Acute kidney failure, unspecified: Secondary | ICD-10-CM | POA: Diagnosis not present

## 2024-09-04 LAB — BASIC METABOLIC PANEL WITH GFR
Anion gap: 8 (ref 5–15)
BUN: 49 mg/dL — ABNORMAL HIGH (ref 8–23)
CO2: 23 mmol/L (ref 22–32)
Calcium: 8.8 mg/dL — ABNORMAL LOW (ref 8.9–10.3)
Chloride: 111 mmol/L (ref 98–111)
Creatinine, Ser: 4.85 mg/dL — ABNORMAL HIGH (ref 0.61–1.24)
GFR, Estimated: 12 mL/min — ABNORMAL LOW
Glucose, Bld: 95 mg/dL (ref 70–99)
Potassium: 3.5 mmol/L (ref 3.5–5.1)
Sodium: 141 mmol/L (ref 135–145)

## 2024-09-04 LAB — GLUCOSE, CAPILLARY: Glucose-Capillary: 142 mg/dL — ABNORMAL HIGH (ref 70–99)

## 2024-09-04 NOTE — Progress Notes (Signed)
 PT Cancellation Note  Patient Details Name: Cody Silva MRN: [id number] DOB: 1946/04/10   Cancelled Treatment:    Reason Eval/Treat Not Completed: Other (comment);Patient not medically ready. Pt just had an episode of lightheadedness/diaphoretic while up to [facility] with nursing staff. BP 88/41, BP improved on second check after back to bed per nursing--defer PT at this time.    Pio Eatherly 09/04/2024, 1:12 PM

## 2024-09-04 NOTE — Plan of Care (Signed)
  Problem: Education: Goal: Knowledge of General Education information will improve Description: Including pain rating scale, medication(s)/side effects and non-pharmacologic comfort measures Outcome: Progressing   Problem: Clinical Measurements: Goal: Cardiovascular complication will be avoided Outcome: Progressing   Problem: Pain Managment: Goal: General experience of comfort will improve and/or be controlled Outcome: Progressing   Problem: Safety: Goal: Ability to remain free from injury will improve Outcome: Progressing

## 2024-09-04 NOTE — Progress Notes (Signed)
 Pt had to have a BM pt was assisted with walker on bedside commode, he was on commode for like 12 minutes CNA went back in to check on him and asked was he finish he said yes. CNA then asked for my assistance with getting him back in bed, when I went in room I witnessed pt leaning to the left side saying he was feeling light headed, I felt him he felt cool and clammy, proceeded to get sitting down vitals and it was B/P 88/41 HR 56 95 ON ROOM AIR he then said the room was spinning so a cold towel was put on his neck we stood pt up with gate belt and assisted him to bed when we laid him down his B/P 133 64 hr 72 o2 100 he color came back and he stated he felt better

## 2024-09-04 NOTE — Progress Notes (Signed)
" °  Progress Note   Patient: Cody Silva FMW:[id number] DOB: Jan 04, 1946 DOA: 08/25/2024     9 DOS: the patient was seen and examined on 09/04/2024   Brief hospital course:   Assessment and Plan: Obstructive urolithiasis AKI superimposed on CKD stage V BPH Obstructive uropathy, nephropathy, BPH.  Status post cystoscopy 12/23 with laser lithotripsy, TURP, stone removal and stent placement. Hematuria persisted despite CBI and so had repeat cystoscopy and clot evacuation 12/25. Off CBI.   Continue Flomax . Completed 7 days of ceftriaxone  Will follow renal function, has trended up slightly.  Will check in a.m., if stable, pursue voiding trial.   Acute delirium with hallucinations and confusion MRI was negative, B12 normal, ammonia normal Presumably secondary to acute illness.  Delirium precautions.   Chronic normocytic anemia ABLA Baseline hemoglobin around 9-10, had hematuria requiring 2 units PRBC for ABLA. Hemoglobin stable.   Hypertensive urgency Essential hypertension Stable.  Continue metoprolol , Norvasc  and clonidine .   Diabetes mellitus type 2, diet controlled Chronic pain, neuropathy Hemoglobin A1c within normal limits July 2025 Continue gabapentin    Mild chronic thrombocytopenia Stable   Incidental finding of subacute lumbar compression fracture on CT CT showed mild anterior wedging of the L2 vertebral body with increased patchy sclerosis, possibly representing healing subacute compression fracture but underlying metastasis is not excluded.   No lumbar back pain at this time and no falls reported.  Radiologist recommending bone scintigraphy or MRI.   Patient will need close outpatient follow-up with PCP for further workup.   Glaucoma Blindness Continue Cosopt , Xalatan    Disposition From: Home To: SNF     Subjective:  Had an episode of vasovagal near syncope secondary to orthostasis while being on the commode for substantial period of time.  He felt quite  weak at the time but feels better now.  Physical Exam: Vitals:   09/03/24 1844 09/03/24 2152 09/04/24 0630 09/04/24 1036  BP: (!) 151/77 (!) 152/74 122/66 117/62  Pulse: 74 75 69 64  Resp: 18 18 18 17   Temp: 98.3 F (36.8 C) 98.9 F (37.2 C) 97.6 F (36.4 C) 98 F (36.7 C)  TempSrc: Oral Oral Oral   SpO2: 100% 100% 100% 100%  Weight:      Height:       Physical Exam Vitals reviewed.  Constitutional:      General: He is not in acute distress.    Appearance: He is not ill-appearing or toxic-appearing.  Cardiovascular:     Rate and Rhythm: Normal rate and regular rhythm.     Heart sounds: No murmur heard. Pulmonary:     Effort: Pulmonary effort is normal. No respiratory distress.     Breath sounds: No wheezing, rhonchi or rales.  Musculoskeletal:     Comments: Good grip strength bilaterally.  Wiggles both feet.  Neurological:     Mental Status: He is alert.  Psychiatric:        Mood and Affect: Mood normal.        Behavior: Behavior normal.     Data Reviewed: Glucose stable.  Creatinine overall improved, not to far from baseline.   Family Communication: Daughter at bedside  Disposition: Status is: Inpatient Remains inpatient appropriate because: Await SNF     Time spent: 20 minutes  Author: Toribio Door, MD 09/04/2024 1:12 PM  For on call review [email or web address]    "

## 2024-09-05 DIAGNOSIS — N201 Calculus of ureter: Secondary | ICD-10-CM | POA: Diagnosis not present

## 2024-09-05 DIAGNOSIS — N179 Acute kidney failure, unspecified: Secondary | ICD-10-CM | POA: Diagnosis not present

## 2024-09-05 DIAGNOSIS — N139 Obstructive and reflux uropathy, unspecified: Secondary | ICD-10-CM | POA: Diagnosis not present

## 2024-09-05 DIAGNOSIS — N185 Chronic kidney disease, stage 5: Secondary | ICD-10-CM | POA: Diagnosis not present

## 2024-09-05 LAB — BASIC METABOLIC PANEL WITH GFR
Anion gap: 8 (ref 5–15)
BUN: 44 mg/dL — ABNORMAL HIGH (ref 8–23)
CO2: 23 mmol/L (ref 22–32)
Calcium: 8.9 mg/dL (ref 8.9–10.3)
Chloride: 109 mmol/L (ref 98–111)
Creatinine, Ser: 4.94 mg/dL — ABNORMAL HIGH (ref 0.61–1.24)
GFR, Estimated: 11 mL/min — ABNORMAL LOW
Glucose, Bld: 97 mg/dL (ref 70–99)
Potassium: 3.4 mmol/L — ABNORMAL LOW (ref 3.5–5.1)
Sodium: 140 mmol/L (ref 135–145)

## 2024-09-05 MED ORDER — POTASSIUM CHLORIDE CRYS ER 20 MEQ PO TBCR
40.0000 meq | EXTENDED_RELEASE_TABLET | Freq: Once | ORAL | Status: AC
  Administered 2024-09-05: 40 meq via ORAL
  Filled 2024-09-05: qty 2

## 2024-09-05 MED ORDER — TAMSULOSIN HCL 0.4 MG PO CAPS: 0.4000 mg | ORAL_CAPSULE | Freq: Every day | ORAL | Status: AC

## 2024-09-05 MED ORDER — LACTATED RINGERS IV SOLN: INTRAVENOUS | Status: DC

## 2024-09-05 MED ORDER — VITAMIN B-1 100 MG PO TABS: 100.0000 mg | ORAL_TABLET | Freq: Every day | ORAL | Status: AC

## 2024-09-05 NOTE — Progress Notes (Signed)
 Physical Therapy Treatment Patient Details Name: Cody Silva MRN: [id number] DOB: October 19, 1945 Today's Date: 09/05/2024   History of Present Illness 79 y.o. male with presenting with complaints of severe right sided abdominal/flank pain and vomiting. Patient was found to have BPH with obstructive uropathy/nephropathy/AKI. Plan cystoscopy by urology on 12/23 with laser lithotripsy, pyelogram, TURP, stone removal and stent placement. Following this still having hematuria. PMH significant of CVA with R sided deficits, brain impairment, CKD stage V has previously declined dialysis, type 2 diabetes, hypertension, ascending thoracic aortic aneurysm, depression, gout, OSA, orthostatic hypotension, recurrent syncope, pancytopenia, blindness    PT Comments   Pt admitted with above diagnosis.  Pt currently with functional limitations due to the deficits listed below (see PT Problem List). Pt in bed when PT arrived. Pt agreeable to therapy intervention. Pt had episode of light headedness with hypotension day prior. Pt endorses dizziness with positional changes, please see below for BP findings. Pt required mod A for supine to sit with use of hospital bed features, mod A to CGA for sitting balance with L lateral and posterior lean, mod A x 2 for sit to stand  from EOB with flexed posture--especially B knee and mild instability noted, pt required mod A x 2 for side stepping to the L with RW, min A x 2 for safety and assist to return to bed and cues for repositioning in bed. Pt left in bed, OT present.  Patient will benefit from continued inpatient follow up therapy, <3 hours/day.  Pt will benefit from acute skilled PT to increase their independence and safety with mobility to allow discharge.      If plan is discharge home, recommend the following: A lot of help with bathing/dressing/bathroom;Assistance with cooking/housework;A lot of help with walking and/or transfers;Assist for transportation;Help with stairs or  ramp for entrance   Can travel by private vehicle     No  Equipment Recommendations  None recommended by PT    Recommendations for Other Services       Precautions / Restrictions Precautions Precautions: Fall Precaution/Restrictions Comments: pt blind Restrictions Weight Bearing Restrictions Per Provider Order: No     Mobility  Bed Mobility Overal bed mobility: Needs Assistance Bed Mobility: Sit to Supine, Supine to Sit     Supine to sit: Mod assist, HOB elevated Sit to supine: Min assist, +2 for safety/equipment, HOB elevated, Used rails   General bed mobility comments: cues and increased time for supine to sit, once in sitting L lateral and posterior lean and strong cues for scooting anteriorly toward EOB, pt required min A x 2 to return to bed with A for B LE and cues for positioning once in bed    Transfers Overall transfer level: Needs assistance Equipment used: Rolling walker (2 wheels) Transfers: Sit to/from Stand Sit to Stand: Mod assist, +2 physical assistance, From elevated surface, +2 safety/equipment           General transfer comment: Pt with reports of feeling dizzy with transitions    Ambulation/Gait               General Gait Details: pt able to side step to the L with cues and mod A x 2, noted flexed posture with soft unstable knees and increased time   Stairs             Wheelchair Mobility     Tilt Bed    Modified Rankin (Stroke Patients Only)       Balance Overall  balance assessment: Needs assistance Sitting-balance support: Feet supported Sitting balance-Leahy Scale: Fair (min to mod A to maintain sitting balance)   Postural control: Left lateral lean, Posterior lean Standing balance support: During functional activity, Bilateral upper extremity supported, Reliant on assistive device for balance Standing balance-Leahy Scale: Poor Standing balance comment: + 2 to remain upright with UE support                             Communication Communication Communication: No apparent difficulties  Cognition Arousal: Alert Behavior During Therapy: Flat affect   PT - Cognitive impairments: No family/caregiver present to determine baseline                         Following commands: Impaired Following commands impaired: Follows one step commands with increased time    Cueing Cueing Techniques: Verbal cues, Tactile cues  Exercises      General Comments        Pertinent Vitals/Pain Pain Assessment Pain Assessment: No/denies pain    Home Living                          Prior Function            PT Goals (current goals can now be found in the care plan section) Acute Rehab PT Goals Patient Stated Goal: I want to be able to go home safe. Family stated they want him to be able to move better in order to care for him at home. PT Goal Formulation: With patient Time For Goal Achievement: 09/10/24 Potential to Achieve Goals: Fair Progress towards PT goals: Progressing toward goals    Frequency    Min 2X/week      PT Plan      Co-evaluation              AM-PAC PT 6 Clicks Mobility   Outcome Measure  Help needed turning from your back to your side while in a flat bed without using bedrails?: A Lot Help needed moving from lying on your back to sitting on the side of a flat bed without using bedrails?: A Lot Help needed moving to and from a bed to a chair (including a wheelchair)?: A Lot Help needed standing up from a chair using your arms (e.g., wheelchair or bedside chair)?: A Lot Help needed to walk in hospital room?: A Lot Help needed climbing 3-5 steps with a railing? : Total 6 Click Score: 11    End of Session Equipment Utilized During Treatment: Gait belt Activity Tolerance: Patient tolerated treatment well Patient left: with call bell/phone within reach;in bed;Other (comment) (OT dovetail tx and in room at end of PT intervention) Nurse  Communication: Mobility status PT Visit Diagnosis: Other abnormalities of gait and mobility (R26.89);Muscle weakness (generalized) (M62.81)     Time: 8390-8366 PT Time Calculation (min) (ACUTE ONLY): 24 min  Charges:    $Therapeutic Activity: 23-37 mins PT General Charges $$ ACUTE PT VISIT: 1 Visit                     [place], PT Acute Rehab    [place] VEAR Drone 09/05/2024, 6:37 PM

## 2024-09-05 NOTE — TOC Progression Note (Signed)
 Transition of Care [facility]) - Progression Note    Patient Details  Name: Cody Silva MRN: [id number] Date of Birth: [birth year]  Transition of Care [facility] CM/SW Contact  Jon ONEIDA Anon, RN Phone Number: 09/05/2024, 9:16 AM  Clinical Narrative:    Per facility Admissions Coordinator pt has a ready bed today. Updated MD and he states pt not medically ready to discharge today. Pt insurance shara will expire today. Spoke with facility about 24 hr grace period. States she will check with Navi and her DON to allow a weekend admit. If pt cannot admit tomorrow will need to restart insurance auth. ICM continuing to follow for DC planning needs.   Expected Discharge Plan: Skilled Nursing Facility Barriers to Discharge: Continued Medical Work up               Expected Discharge Plan and Services   Discharge Planning Services: CM Consult Post Acute Care Choice: Skilled Nursing Facility Living arrangements for the past 2 months: Skilled Nursing Facility                                       Social Drivers of Health (SDOH) Interventions SDOH Screenings   Food Insecurity: No Food Insecurity (08/26/2024)  Housing: Low Risk (08/26/2024)  Transportation Needs: No Transportation Needs (08/26/2024)  Utilities: Not At Risk (08/26/2024)  Social Connections: Moderately Integrated (08/26/2024)  Tobacco Use: Low Risk (08/26/2024)    Readmission Risk Interventions    03/26/2024   10:14 AM  Readmission Risk Prevention Plan  Transportation Screening Complete  PCP or Specialist Appt within 5-7 Days Complete  Home Care Screening Complete  Medication Review (RN CM) Complete

## 2024-09-05 NOTE — Progress Notes (Signed)
 Occupational Therapy Treatment Patient Details Name: Cody Silva MRN: [id number] DOB: Aug 09, 1946 Today's Date: 09/05/2024   History of present illness 79 y.o. male with presenting with complaints of severe right sided abdominal/flank pain and vomiting. Patient was found to have BPH with obstructive uropathy/nephropathy/AKI. Plan cystoscopy by urology on 12/23 with laser lithotripsy, pyelogram, TURP, stone removal and stent placement. Following this still having hematuria. PMH significant of CVA with R sided deficits, brain impairment, CKD stage V has previously declined dialysis, type 2 diabetes, hypertension, ascending thoracic aortic aneurysm, depression, gout, OSA, orthostatic hypotension, recurrent syncope, pancytopenia, blindness   OT comments  Pt seen for OT treatment session, overlap with PT for mobility portion for +2 for safety. Pt continues to present with L lateral head turn / lean, and impairments in spatial awareness, slow processing and generalized muscle weakness. Able to stand with MOD A +2 using RW, forward flexed posture with bilateral knee instability and poor ability to correct to midline. Pt with reports of dizziness upon standing with BP WNL, able to take a few lateral sidesteps towards HOB using RW and returns to supine to perform oral care in long sitting. Requires significant assist 2/2 blindness, hand over hand to locate objects and to grasp toothbrush, cup, etc. Continue to recommend +2 for safety for all transfers/mobility attempts. Discharge recommendation appropriate. Patient will benefit from continued inpatient follow up therapy, <3 hours/day       If plan is discharge home, recommend the following:  Two people to help with walking and/or transfers;A lot of help with bathing/dressing/bathroom;Assistance with cooking/housework;Assistance with feeding;Direct supervision/assist for medications management;Direct supervision/assist for financial management;Assist for  transportation;Help with stairs or ramp for entrance;Supervision due to cognitive status   Equipment Recommendations  Wheelchair (measurements OT);Wheelchair cushion (measurements OT);BSC/3in1;Tub/shower seat    Recommendations for Other Services      Precautions / Restrictions Precautions Precautions: Fall Precaution/Restrictions Comments: pt blind Restrictions Weight Bearing Restrictions Per Provider Order: No       Mobility Bed Mobility Overal bed mobility: Needs Assistance Bed Mobility: Sit to Supine       Sit to supine: Min assist, +2 for safety/equipment, HOB elevated, Used rails        Transfers Overall transfer level: Needs assistance Equipment used: Rolling walker (2 wheels) Transfers: Sit to/from Stand Sit to Stand: Mod assist, +2 physical assistance, From elevated surface, +2 safety/equipment           General transfer comment: Pt with reports of feeling dizzy, encourgement to take lateral sidesteps.     Balance Overall balance assessment: Needs assistance Sitting-balance support: Feet supported Sitting balance-Leahy Scale: Fair   Postural control: Left lateral lean Standing balance support: During functional activity, Bilateral upper extremity supported, Reliant on assistive device for balance Standing balance-Leahy Scale: Poor Standing balance comment: + 2 to remain upright with UE support                           ADL either performed or assessed with clinical judgement   ADL Overall ADL's : Needs assistance/impaired     Grooming: Bed level;Oral care;Wash/dry face;Wash/dry hands;Minimal assistance Grooming Details (indicate cue type and reason): long sitting in bed, pt requires cues for location of items, hand over hand to grasp cup and toothbrush due to visual deficits                             Functional  mobility during ADLs: +2 for physical assistance;Moderate assistance;Cueing for safety;Cueing for  sequencing;Rolling walker (2 wheels) General ADL Comments: Pt recieved sitting EOB with PT, overlap for safety during mobility. Pt able to stand for BP assessment (130/61 HR 80), reports feeling dizzy with noted bilateral knee instability. Pt able to take a few sidesteps with RW and +2  for safety.     Vision   Additional Comments: pt blind   Perception Perception Perception: Impaired   Praxis Praxis Praxis: Impaired Praxis Impairment Details: Motor planning;Organization   Communication Communication Communication: No apparent difficulties   Cognition Arousal: Alert Behavior During Therapy: WFL for tasks assessed/performed Cognition: Cognition impaired     Awareness: Intellectual awareness impaired, Online awareness impaired Memory impairment (select all impairments): Short-term memory Attention impairment (select first level of impairment): Sustained attention Executive functioning impairment (select all impairments): Initiation, Organization, Sequencing, Reasoning, Problem solving OT - Cognition Comments: slow processing, decreased insight and judgement                 Following commands: Impaired Following commands impaired: Follows one step commands with increased time      Cueing   Cueing Techniques: Verbal cues, Tactile cues             Pertinent Vitals/ Pain       Pain Assessment Pain Assessment: No/denies pain         Frequency  Min 2X/week        Progress Toward Goals  OT Goals(current goals can now be found in the care plan section)  Progress towards OT goals: Progressing toward goals  Acute Rehab OT Goals OT Goal Formulation: With patient Time For Goal Achievement: 09/12/24 Potential to Achieve Goals: Good ADL Goals Pt Will Perform Eating: with min assist;sitting Pt Will Perform Grooming: with contact guard assist;sitting Pt Will Perform Upper Body Bathing: with min assist;sitting Pt Will Perform Upper Body Dressing: with min  assist;sitting Pt Will Transfer to Toilet: with min assist;with +2 assist;bedside commode;stand pivot transfer  Plan         AM-PAC OT 6 Clicks Daily Activity     Outcome Measure   Help from another person eating meals?: A Lot Help from another person taking care of personal grooming?: A Lot Help from another person toileting, which includes using toliet, bedpan, or urinal?: Total Help from another person bathing (including washing, rinsing, drying)?: A Lot Help from another person to put on and taking off regular upper body clothing?: A Lot Help from another person to put on and taking off regular lower body clothing?: A Lot 6 Click Score: 11    End of Session Equipment Utilized During Treatment: Gait belt;Rolling walker (2 wheels)  OT Visit Diagnosis: Unsteadiness on feet (R26.81);Other abnormalities of gait and mobility (R26.89);Muscle weakness (generalized) (M62.81);Low vision, both eyes (H54.2);Cognitive communication deficit (R41.841) Symptoms and signs involving cognitive functions: Cerebral infarction   Activity Tolerance Patient tolerated treatment well   Patient Left in bed;with call bell/phone within reach;with bed alarm set   Nurse Communication Mobility status        Time: 1420-1440 OT Time Calculation (min): 20 min  Charges: OT General Charges $OT Visit: 1 Visit OT Treatments $Self Care/Home Management : 8-22 mins  Cody Silva, OTR/L  09/05/2024, 6:22 PM

## 2024-09-05 NOTE — Plan of Care (Signed)
  Problem: Clinical Measurements: Goal: Ability to maintain clinical measurements within normal limits will improve Outcome: Progressing   Problem: Nutrition: Goal: Adequate nutrition will be maintained Outcome: Progressing   Problem: Coping: Goal: Level of anxiety will decrease Outcome: Progressing   Problem: Pain Managment: Goal: General experience of comfort will improve and/or be controlled Outcome: Progressing   Problem: Safety: Goal: Ability to remain free from injury will improve Outcome: Progressing

## 2024-09-05 NOTE — Plan of Care (Signed)
°  Problem: Clinical Measurements: °Goal: Diagnostic test results will improve °Outcome: Progressing °  °Problem: Activity: °Goal: Risk for activity intolerance will decrease °Outcome: Progressing °  °Problem: Elimination: °Goal: Will not experience complications related to urinary retention °Outcome: Progressing °  °Problem: Safety: °Goal: Ability to remain free from injury will improve °Outcome: Progressing °  °

## 2024-09-05 NOTE — Discharge Summary (Signed)
 " Physician Discharge Summary   Patient: Cody Silva MRN: [id number] DOB: 04/25/1946  Admit date:     08/25/2024  Discharge date: `09/06/2024  Discharge Physician: Toribio Door   PCP: [facility]   Recommendations at discharge:   See below in red  Discharge Diagnoses: Principal Problem:   Urolithiasis Active Problems:   Diabetes mellitus (HCC)   Hypertension associated with diabetes (HCC)   History of CVA (cerebrovascular accident)   Depression   Diabetic neuropathy (HCC)   AKI (acute kidney injury)   CKD (chronic kidney disease), stage V (HCC)   Type 2 diabetes mellitus (HCC)   Hypertensive urgency   BPH (benign prostatic hyperplasia)   Acute delirium   Obstructive uropathy  Resolved Problems:   * No resolved hospital problems. *  Hospital Course: 79 year old man complex PMH including stroke, CKD stage V previously declined dialysis, diabetes, orthostatic hypotension with recurrent syncope, pancytopenia, blindness, presented with right flank pain and abdominal pain with vomiting.  CT showed distal right ureter stone with obstruction.  Admitted for obstructive urolithiasis, AKI on CKD stage V.  Seen by urology, underwent cystoscopy and other procedures as below.  Developed gross hematuria postoperatively and return to the operating room 2 days later for cystoscopy with clot evacuation, TURP, left stent placement.  Required transfusion.  Consultants Urology   Procedures/Events 12/22 admit for obstructive urolithiasis 12/23 Cystoscopy Right ureteroscopy with laser lithotripsy Right retrograde pyelogram Transurethral resection of prostate Right retrograde stent placement Ureteral calculus removal Fluoroscopy with intraoperative interpretation 12/25 Cystoscopy clot evacuation, TURP, left retrograde pyelogram, left ureteral stent placement   Obstructive urolithiasis Gross hematuria  AKI superimposed on CKD stage V BPH Baseline creatinine around  4.3-5.  Admission 5.57, with peak at 6.23.   Obstructive uropathy, nephropathy, BPH.  Status post cystoscopy 12/23 with laser lithotripsy, TURP, stone removal and stent placement. Hematuria persisted despite CBI and so had repeat cystoscopy and clot evacuation 12/25. Off CBI.   Continue Flomax . Completed 7 days of ceftriaxone  Will follow renal function, has trended up slightly.  Will check in a.m., if stable, pursue voiding trial, and if successful can discharge.   Acute delirium with hallucinations and confusion PMH CVA Disequilibrium MRI was negative, B12 normal, ammonia normal Presumably secondary to acute illness.  Delirium precautions.   Chronic normocytic anemia ABLA Baseline hemoglobin around 9-10, had hematuria requiring 2 units PRBC for ABLA. Hemoglobin stable.   Hypertensive urgency Essential hypertension Stable.  Continue metoprolol , Norvasc  and clonidine .   Diabetes mellitus type 2, diet controlled Chronic pain, neuropathy Hemoglobin A1c within normal limits July 2025 Continue gabapentin    Mild chronic thrombocytopenia Stable   Incidental finding of subacute lumbar compression fracture on CT CT showed mild anterior wedging of the L2 vertebral body with increased patchy sclerosis, possibly representing healing subacute compression fracture but underlying metastasis is not excluded.   No lumbar back pain at this time and no falls reported.  Radiologist recommending bone scintigraphy or MRI.   Patient will need close outpatient follow-up with PCP for further workup.   Glaucoma Blindness Continue Cosopt , Xalatan    Disposition From: Home To: SNF   Disposition: Skilled nursing facility Diet recommendation:  Diet Orders (From admission, onward)     Start     Ordered   08/28/24 1108  Diet renal/carb modified with fluid restriction Diet-HS Snack? Nothing; Fluid restriction: 1200 mL Fluid; Room service appropriate? Yes; Fluid consistency: Thin  Diet effective now        Question Answer Comment  Diet-HS Snack? Nothing   Fluid restriction: 1200 mL Fluid   Room service appropriate? Yes   Fluid consistency: Thin      08/28/24 1107            DISCHARGE MEDICATION: Allergies as of 09/05/2024       Reactions   Metformin And Related Diarrhea   Felodipine Swelling, Other (See Comments)   Edema    Lactose Intolerance (gi) Other (See Comments)   Gi upset   Pseudoephedrine-acetaminophen  Nausea And Vomiting   Terazosin Other (See Comments)   Syncope   Tylenol  With Codeine #3 [acetaminophen -codeine] Nausea Only, Other (See Comments)   Patient can take regular Tylenol  with no problems, however   Tape Rash        Medication List     TAKE these medications    acetaminophen  500 MG tablet Commonly known as: TYLENOL  Take 1 tablet (500 mg total) by mouth every 6 (six) hours as needed for mild pain, moderate pain, fever or headache. What changed:  how much to take when to take this additional instructions   albuterol  108 (90 Base) MCG/ACT inhaler Commonly known as: VENTOLIN  HFA Inhale 1 puff into the lungs every 6 (six) hours as needed for wheezing or shortness of breath.   Alka-Seltzer [phone] MG Tbef tablet Generic drug: aspirin -sod bicarb-citric acid Take 325 mg by mouth every 6 (six) hours as needed (dissolve as directed- for indigestion).   amLODipine  5 MG tablet Commonly known as: NORVASC  Take 1 tablet (5 mg total) by mouth at bedtime.   ammonium lactate 12 % lotion Commonly known as: LAC-HYDRIN Apply 1 application topically as needed for dry skin.   atorvastatin  40 MG tablet Commonly known as: LIPITOR Take 40 mg by mouth at bedtime.   calcitRIOL  0.25 MCG capsule Commonly known as: ROCALTROL  Take 0.25 mcg by mouth daily.   cloNIDine  0.1 mg/24hr patch Commonly known as: CATAPRES  - Dosed in mg/24 hr Place 0.1 mg onto the skin every Saturday.   clopidogrel  75 MG tablet Commonly known as: PLAVIX  Take 1 tablet (75 mg  total) by mouth at bedtime. What changed: when to take this   dorzolamide -timolol  2-0.5 % ophthalmic solution Commonly known as: COSOPT  Place 1 drop into both eyes 2 (two) times daily.   ERGOCALCIFEROL  PO Take 1,000 Units by mouth daily.   escitalopram  10 MG tablet Commonly known as: LEXAPRO  Take 10 mg by mouth at bedtime.   ferrous sulfate  325 (65 FE) MG tablet Take 325 mg by mouth every Monday, Wednesday, and Friday.   fluticasone  50 MCG/ACT nasal spray Commonly known as: FLONASE  Place 1 spray into both nostrils daily as needed for allergies or rhinitis.   gabapentin  300 MG capsule Commonly known as: NEURONTIN  Take 300 mg by mouth at bedtime.   hydrALAZINE  10 MG tablet Commonly known as: APRESOLINE  TAKE HYDRALAZINE  10 MG BY MOUTH TWICE DAILY AS NEEDED IF BLOOD PRESSURE IS 160/100 What changed:  how much to take how to take this when to take this reasons to take this additional instructions   latanoprost  0.005 % ophthalmic solution Commonly known as: XALATAN  Place 1 drop into both eyes at bedtime.   tamsulosin  0.4 MG Caps capsule Commonly known as: FLOMAX  Take 1 capsule (0.4 mg total) by mouth daily. Start taking on: September 06, 2024   thiamine  100 MG tablet Commonly known as: Vitamin B-1 Take 1 tablet (100 mg total) by mouth daily. Start taking on: September 06, 2024        Contact  information for follow-up providers     Shane Steffan BROCKS, MD. Schedule an appointment as soon as possible for a visit in 2 week(s).   Specialty: Urology Contact information: [address] [place] [place] [address] [phone]         [facility]. Schedule an appointment as soon as possible for a visit in 1 week(s).   Contact information: [address] [facility] [place] [address] [phone]              Contact information for after-discharge care     Destination     [facility] .   Service:  Skilled Nursing Contact information: [address] [place]  [address] [phone]                    Discharge Exam: Fredricka Weights   08/25/24 1642 08/26/24 1625  Weight: 68.9 kg 69.3 kg   Physical Exam Vitals reviewed.  Constitutional:      General: He is not in acute distress.    Appearance: He is not ill-appearing or toxic-appearing.  Cardiovascular:     Rate and Rhythm: Normal rate and regular rhythm.     Heart sounds: No murmur heard. Pulmonary:     Effort: Pulmonary effort is normal. No respiratory distress.     Breath sounds: No wheezing, rhonchi or rales.  Neurological:     Mental Status: He is alert.  Psychiatric:        Mood and Affect: Mood normal.        Behavior: Behavior normal.      Condition at discharge: good  The results of significant diagnostics from this hospitalization (including imaging, microbiology, ancillary and laboratory) are listed below for reference.   Imaging Studies: MR BRAIN WO CONTRAST Result Date: 08/31/2024 EXAM: MRI BRAIN WITHOUT CONTRAST 08/30/2024 12:57:50 PM TECHNIQUE: Multiplanar multisequence MRI of the head/brain was performed without the administration of intravenous contrast. COMPARISON: MR Head 03/26/2024. CLINICAL HISTORY: Mental status change, unknown cause. FINDINGS: BRAIN AND VENTRICLES: No acute infarct. No acute intracranial hemorrhage. No mass. No midline shift. No hydrocephalus. Similar moderate chronic microvascular ischemic change and cerebral atrophy. Similar scattered chronic cerebral microhemorrhages, including in both thalami and likely the sequelae of chronic hypertension. Normal flow voids. ORBITS: No acute abnormality. SINUSES AND MASTOIDS: No acute abnormality. BONES AND SOFT TISSUES: Normal marrow signal. No acute soft tissue abnormality. IMPRESSION: 1. No acute intracranial abnormality. Electronically signed by: Gilmore Molt 08/31/2024 02:09 AM EST RP Workstation: [id number]    DG C-Arm 1-60 Min-No Report Result Date: 08/28/2024 Fluoroscopy was utilized by the requesting physician.  No radiographic interpretation.   CT CYSTOGRAM ABD/PELVIS Result Date: 08/27/2024 CLINICAL DATA:  Abdominal pain.  Recent cystoscopy stent placement. EXAM: CT CYSTOGRAM (CT ABDOMEN AND PELVIS WITH CONTRAST) TECHNIQUE: Multi-detector CT imaging through the abdomen and pelvis was performed after dilute contrast had been introduced into the bladder for the purposes of performing CT cystography. RADIATION DOSE REDUCTION: This exam was performed according to the departmental dose-optimization program which includes automated exposure control, adjustment of the mA and/or kV according to patient size and/or use of iterative reconstruction technique. CONTRAST:  50mL OMNIPAQUE  IOHEXOL  300 MG/ML  SOLN COMPARISON:  Noncontrast CT yesterday FINDINGS: Lower chest: Increased atelectasis in the lung bases. Trace left pleural fluid. Decreased density of the blood pool suggestive of anemia. Hepatobiliary: Calcified granuloma again seen in the left lobe of the liver. Layering density in the gallbladder may represent stones  or sludge. No gallbladder wall thickening. Common bile duct is poorly assessed on the current exam. Pancreas: Parenchymal atrophy. No ductal dilatation or inflammation. Spleen: Normal in size without focal abnormality. Adrenals/Urinary Tract: No adrenal nodule. Placement of right nephroureteral stent with proximal pigtail in the renal pelvis and distal pigtail in the bladder. No significant change in right hydronephrosis. Calcification posterior to the bladder that was question distal ureteral calculus is tentatively visualized as phlebolith outside the course of the ureter, series 2, image 57. Contrast instilled into the urinary bladder. There is no evidence of contrast extravasation. Foley catheter in place. There is a rounded density in the inferior bladder surrounding the Foley balloon measuring  approximately 6.3 x 6 x 5.4 cm. This is higher density than the adjacent prostate. This is surrounded by instilled contrast medial and laterally, but no contrast cleft is seen centrally. This is favored to represent blood clot rather than prostatic enlargement. Stomach/Bowel: Limited assessment on the current exam due to lack of contrast and motion. No bowel obstruction or inflammation. Normal appendix. Vascular/Lymphatic: Aorto bi-iliac atherosclerosis. There is no bulky lymphadenopathy. Reproductive: Prostate gland is enlarged spanning 5.5 cm transverse. Rounded density in the bladder higher density than the adjacent prostate, favored to represent blood clot rather than prostatic mass effect. Other: Generalized subcutaneous edema. Mild edema of the intra-abdominal fat. No significant ascites. No free air. Musculoskeletal: Increased sclerosis within the L2 vertebral body with mild anterior wedging is unchanged from yesterday's exam. No acute osseous findings. IMPRESSION: 1. Placement of right nephroureteral stent with proximal pigtail in the renal pelvis and distal pigtail in the bladder. No significant change in right hydronephrosis. The right pelvic calcification question to be ureteral is tentatively visualized outside the course of the ureter on the current exam. 2. Rounded density in the inferior bladder surrounding the Foley balloon measuring approximately 6.3 x 6 x 5.4 cm. This is higher density than the adjacent prostate, favored to represent blood clot rather than prostatic enlargement. 3. No evidence of contrast extravasation from the urinary bladder. 4. Increased sclerosis within the L2 vertebral body with mild anterior wedging is unchanged from yesterday's exam. 5. Increased atelectasis in the lung bases with trace left pleural fluid. Aortic Atherosclerosis (ICD10-I70.0). Electronically Signed   By: Andrea Gasman M.D.   On: 08/27/2024 19:38   DG C-Arm 1-60 Min-No Report Result Date:  08/26/2024 Fluoroscopy was utilized by the requesting physician.  No radiographic interpretation.   DG C-Arm 1-60 Min-No Report Result Date: 08/26/2024 Fluoroscopy was utilized by the requesting physician.  No radiographic interpretation.   CT ABDOMEN PELVIS WO CONTRAST Result Date: 08/26/2024 EXAM: CT ABDOMEN AND PELVIS WITHOUT CONTRAST 08/26/2024 12:20:20 AM TECHNIQUE: CT of the abdomen and pelvis was performed without the administration of intravenous contrast. Multiplanar reformatted images are provided for review. Automated exposure control, iterative reconstruction, and/or weight-based adjustment of the mA/kV was utilized to reduce the radiation dose to as low as reasonably achievable. COMPARISON: None available. CLINICAL HISTORY: Abdominal/flank pain, stone suspected. Right lower quadrant pain with onset last night with nausea and vomiting. FINDINGS: LOWER CHEST: The lung bases are clear of infiltrates. There is mild elevation of the right hemidiaphragm. The heart is slightly enlarged. There is linear scarring or atelectasis in both lower lobes. LIVER: There are calcified granulomas in the left lobe of the liver with no other focal abnormality of the unenhanced liver. GALLBLADDER AND BILE DUCTS: There is layering low attenuation which could be sludge or noncalcified stones in the gallbladder without  wall thickening or biliary dilatation. SPLEEN: No acute abnormality. PANCREAS: No acute abnormality. ADRENAL GLANDS: No acute abnormality. KIDNEYS, URETERS AND BLADDER: There is no contour deforming mass of the unenhanced kidneys. There is a Bosniak 1 cyst posteriorly in the left kidney measuring 1.3 cm and 16 HU units. Per consensus, no follow-up is needed for simple Bosniak type 1 and 2 renal cysts, unless the patient has a malignancy history or risk factors. On the right, there is both renal and perirenal edema, peripelvic and periureteral stranding edema, and moderate hydroureteronephrosis. There is a  3 mm calcification unable to be excluded from the plane of the distal right ureter / UVJ on series 2 axial 63, but this may be a phlebolith because it does not have a surrounding cuff of soft tissue. If this is a ureteral stone the findings are consistent with obstructive urolithiasis. If there is no ureteral stone, then the findings could be due to a recently passed stone or an ascending UTI. There are no calyceal stones in either kidney. No intravesical stone is seen. No other suggestion of ureteral stones. There is mild bladder thickening, which is probably due to chronic bladder outlet obstruction related to the enlarged prostate. The prostate measures up to 6.7 cm in diameter with a prominent impression into the inferior bladder by disproportionate median lobe hypertrophy. There are multiple pelvic phleboliths. GI AND BOWEL: Stomach demonstrates no acute abnormality. There is no bowel obstruction or inflammation. The appendix is normal caliber. PERITONEUM AND RETROPERITONEUM: There is trace pelvic ascites. No free air. Slight mesenteric congestion. VASCULATURE: Aorta is normal in caliber. There is heavy aortic and branch vessel atherosclerosis without aneurysm. LYMPH NODES: No lymphadenopathy. REPRODUCTIVE ORGANS: The prostate measures up to 6.7 cm in diameter with a prominent impression into the inferior bladder by disproportionate median lobe hypertrophy. BONES AND SOFT TISSUES: There is mild anterior wedging of the L2 vertebral body with increased patchy sclerosis in the L2 body. This could be a healing response such as due to a subacute compression fracture. Underlying metastasis is not excluded. Follow-up as indicated. Bone Scintigraphy or MRI without and with contrast may be helpful. There are bridging osteophytes over the anterior right SI joint. Mild edema in the body wall is seen. No focal soft tissue abnormality. IMPRESSION: 1. Right renal and perirenal edema, peripelvic and periureteral edema, and  moderate hydroureteronephrosis. A 3 mm calcification at the distal right ureter/UVJ may represent a ureteral stone or a phlebolith. Findings are consistent with obstructive urolithiasis if this is a stone; alternatively, findings could be due to a recently passed stone or an ascending UTI. 2. Mild bladder thickening, likely due to chronic bladder outlet obstruction related to the enlarged prostate with disproportionate median lobe hypertrophy. 3. Mild anterior wedging of the L2 vertebral body with increased patchy sclerosis, possibly representing a healing subacute compression fracture. Underlying metastasis is not excluded. Follow-up as indicated, and bone scintigraphy or MRI without and with contrast may be helpful. 4. Mild edema in the body wall and mesentery with minimal societies. Electronically signed by: Francis Quam MD 08/26/2024 01:20 AM EST RP Workstation: [id number]    Microbiology: Results for orders placed or performed during the hospital encounter of 08/25/24  Urine Culture (for pregnant, neutropenic or urologic patients or patients with an indwelling urinary catheter)     Status: None   Collection Time: 08/26/24  8:40 AM   Specimen: Urine, Clean Catch  Result Value Ref Range Status   Specimen Description URINE, CLEAN CATCH  Final   Special Requests NONE  Final   Culture   Final    NO GROWTH Performed at [facility], [address], [place], [place] [address]    Report Status 08/27/2024 FINAL  Final    Labs: CBC: Recent Labs  Lab 08/30/24 0534 08/31/24 0544 09/01/24 0514 09/02/24 0516  WBC 8.2 5.9 6.4 6.9  NEUTROABS 6.4 4.1 4.7  --   HGB 8.5* 8.2* 8.2* 8.1*  HCT 24.9* 24.6* 24.8* 24.0*  MCV 90.5 90.1 90.2 89.6  PLT 129* 128* 139* 151   Basic Metabolic Panel: Recent Labs  Lab 09/01/24 0514 09/02/24 0516 09/03/24 1007 09/04/24 0520 09/05/24 0503  NA 141 141 143 141 140  K 3.4* 3.3* 3.5 3.5 3.4*  CL 112* 111 112* 111 109  CO2 20* 22 23 23 23   GLUCOSE 92  80 109* 95 97  BUN 54* 49* 49* 49* 44*  CREATININE 5.04* 4.56* 4.76* 4.85* 4.94*  CALCIUM  8.8* 9.1 9.2 8.8* 8.9   Liver Function Tests: No results for input(s): AST, ALT, ALKPHOS, BILITOT, PROT, ALBUMIN in the last 168 hours. CBG: Recent Labs  Lab 09/02/24 1158 09/04/24 1200  GLUCAP 81 142*    Discharge time spent: greater than 30 minutes.  Signed: Toribio Door, MD [facility] 09/05/2024 "

## 2024-09-06 DIAGNOSIS — E1140 Type 2 diabetes mellitus with diabetic neuropathy, unspecified: Secondary | ICD-10-CM | POA: Diagnosis not present

## 2024-09-06 DIAGNOSIS — N179 Acute kidney failure, unspecified: Secondary | ICD-10-CM | POA: Diagnosis not present

## 2024-09-06 DIAGNOSIS — N201 Calculus of ureter: Secondary | ICD-10-CM | POA: Diagnosis not present

## 2024-09-06 DIAGNOSIS — N400 Enlarged prostate without lower urinary tract symptoms: Secondary | ICD-10-CM

## 2024-09-06 LAB — BASIC METABOLIC PANEL WITH GFR
Anion gap: 6 (ref 5–15)
BUN: 44 mg/dL — ABNORMAL HIGH (ref 8–23)
CO2: 24 mmol/L (ref 22–32)
Calcium: 8.7 mg/dL — ABNORMAL LOW (ref 8.9–10.3)
Chloride: 108 mmol/L (ref 98–111)
Creatinine, Ser: 4.76 mg/dL — ABNORMAL HIGH (ref 0.61–1.24)
GFR, Estimated: 12 mL/min — ABNORMAL LOW
Glucose, Bld: 114 mg/dL — ABNORMAL HIGH (ref 70–99)
Potassium: 3.9 mmol/L (ref 3.5–5.1)
Sodium: 138 mmol/L (ref 135–145)

## 2024-09-06 NOTE — Progress Notes (Signed)
 " Progress Note   Patient: Cody Silva FMW:[id number] DOB: 02/19/1946 DOA: 08/25/2024     11 DOS: the patient was seen and examined on 09/06/2024   Brief hospital course: 79 year old man complex PMH including stroke, CKD stage V previously declined dialysis, diabetes, orthostatic hypotension with recurrent syncope, pancytopenia, blindness, presented with right flank pain and abdominal pain with vomiting.  CT showed distal right ureter stone with obstruction.  Admitted for obstructive urolithiasis, AKI on CKD stage V.  Seen by urology, underwent cystoscopy and other procedures as below.  Developed gross hematuria postoperatively and return to the operating room 2 days later for cystoscopy with clot evacuation, TURP, left stent placement.  Required transfusion.  Consultants Urology   Procedures/Events 12/22 admit for obstructive urolithiasis 12/23 Cystoscopy Right ureteroscopy with laser lithotripsy Right retrograde pyelogram Transurethral resection of prostate Right retrograde stent placement Ureteral calculus removal Fluoroscopy with intraoperative interpretation 12/25 Cystoscopy clot evacuation, TURP, left retrograde pyelogram, left ureteral stent placement   Assessment and Plan: Obstructive urolithiasis Gross hematuria  AKI superimposed on CKD stage V BPH Baseline creatinine around 4.3-5.  Admission 5.57, with peak at 6.23.   Obstructive uropathy, nephropathy, BPH.  Status post cystoscopy 12/23 with laser lithotripsy, TURP, stone removal and stent placement. Hematuria persisted despite CBI and so had repeat cystoscopy and clot evacuation 12/25. Off CBI.   Continue Flomax . Completed 7 days of ceftriaxone  Creatinine slightly better today.  Has been variable, not much improvement since 12/29. If stable tomorrow, pull Foley and plan SNF discharge 1/5.   Acute delirium with hallucinations and confusion PMH CVA Disequilibrium MRI was negative, B12 normal, ammonia  normal Presumably secondary to acute illness.  Delirium precautions.   Chronic normocytic anemia ABLA Baseline hemoglobin around 9-10, had hematuria requiring 2 units PRBC for ABLA. Hemoglobin stable.   Hypertensive urgency Essential hypertension Stable.  Continue metoprolol , Norvasc  and clonidine .   Diabetes mellitus type 2, diet controlled Chronic pain, neuropathy Hemoglobin A1c within normal limits July 2025 Continue gabapentin    Mild chronic thrombocytopenia Stable   Incidental finding of subacute lumbar compression fracture on CT CT showed mild anterior wedging of the L2 vertebral body with increased patchy sclerosis, possibly representing healing subacute compression fracture but underlying metastasis is not excluded.   No lumbar back pain at this time and no falls reported.  Radiologist recommending bone scintigraphy or MRI.   Patient will need close outpatient follow-up with PCP for further workup.   Glaucoma Blindness Continue Cosopt , Xalatan    Disposition From: Home To: SNF      Subjective:  Feels okay today, tolerating diet  Physical Exam: Vitals:   09/05/24 1342 09/05/24 2113 09/06/24 0536 09/06/24 1030  BP: 137/72 131/63 119/67 137/63  Pulse: 64 (!) 59 (!) 54 (!) 55  Resp: 18 19 19 15   Temp: 98.2 F (36.8 C) 98.6 F (37 C) 98.5 F (36.9 C) 99 F (37.2 C)  TempSrc: Oral Oral Oral Oral  SpO2:  100% 100% 100%  Weight:      Height:       Physical Exam Vitals reviewed.  Constitutional:      General: He is not in acute distress.    Appearance: He is not ill-appearing or toxic-appearing.  Cardiovascular:     Rate and Rhythm: Normal rate and regular rhythm.     Heart sounds: No murmur heard. Pulmonary:     Effort: Pulmonary effort is normal. No respiratory distress.     Breath sounds: No wheezing, rhonchi or rales.  Neurological:  Mental Status: He is alert.  Psychiatric:        Mood and Affect: Mood normal.        Behavior: Behavior  normal.     Data Reviewed: Urine output 950 I/O since admission not accurate Creatinine variable, not much improvement since 12/30.  Creatinine 4.76  Family Communication:   Disposition: Status is: Inpatient Remains inpatient appropriate because: Renal function     Time spent: 20 minutes  Author: Toribio Door, MD 09/06/2024 3:40 PM  For on call review [email or web address].    "

## 2024-09-06 NOTE — Plan of Care (Signed)
 ?  Problem: Clinical Measurements: ?Goal: Ability to maintain clinical measurements within normal limits will improve ?Outcome: Progressing ?Goal: Will remain free from infection ?Outcome: Progressing ?Goal: Diagnostic test results will improve ?Outcome: Progressing ?  ?

## 2024-09-06 NOTE — TOC Progression Note (Signed)
 Transition of Care [facility]) - Progression Note    Patient Details  Name: Cody Silva MRN: [id number] Date of Birth: 05-Apr-1946  Transition of Care [facility]) CM/SW Contact  Jon ONEIDA Anon, RN Phone Number: 09/06/2024, 12:14 PM  Clinical Narrative:    RNCM spoke with MD about discharge. Pt unable to discharge today. Pt had 24 hr grace period today but unable to discharge to facility due to not being medically ready. RNCM notified Admissions with [facility]. Will need new insurance authorization. ICM following for DC planning needs.   Expected Discharge Plan: Skilled Nursing Facility Barriers to Discharge: Continued Medical Work up               Expected Discharge Plan and Services   Discharge Planning Services: CM Consult Post Acute Care Choice: Skilled Nursing Facility Living arrangements for the past 2 months: Skilled Nursing Facility                                       Social Drivers of Health (SDOH) Interventions SDOH Screenings   Food Insecurity: No Food Insecurity (08/26/2024)  Housing: Low Risk (08/26/2024)  Transportation Needs: No Transportation Needs (08/26/2024)  Utilities: Not At Risk (08/26/2024)  Social Connections: Moderately Integrated (08/26/2024)  Tobacco Use: Low Risk (08/26/2024)    Readmission Risk Interventions    03/26/2024   10:14 AM  Readmission Risk Prevention Plan  Transportation Screening Complete  PCP or Specialist Appt within 5-7 Days Complete  Home Care Screening Complete  Medication Review (RN CM) Complete

## 2024-09-06 NOTE — Plan of Care (Signed)
   Problem: Education: Goal: Knowledge of General Education information will improve Description Including pain rating scale, medication(s)/side effects and non-pharmacologic comfort measures Outcome: Progressing   Problem: Health Behavior/Discharge Planning: Goal: Ability to manage health-related needs will improve Outcome: Progressing

## 2024-09-07 DIAGNOSIS — N185 Chronic kidney disease, stage 5: Secondary | ICD-10-CM | POA: Diagnosis not present

## 2024-09-07 DIAGNOSIS — N139 Obstructive and reflux uropathy, unspecified: Secondary | ICD-10-CM | POA: Diagnosis not present

## 2024-09-07 DIAGNOSIS — N201 Calculus of ureter: Secondary | ICD-10-CM | POA: Diagnosis not present

## 2024-09-07 DIAGNOSIS — N179 Acute kidney failure, unspecified: Secondary | ICD-10-CM | POA: Diagnosis not present

## 2024-09-07 LAB — BASIC METABOLIC PANEL WITH GFR
Anion gap: 7 (ref 5–15)
BUN: 38 mg/dL — ABNORMAL HIGH (ref 8–23)
CO2: 25 mmol/L (ref 22–32)
Calcium: 8.9 mg/dL (ref 8.9–10.3)
Chloride: 106 mmol/L (ref 98–111)
Creatinine, Ser: 4.53 mg/dL — ABNORMAL HIGH (ref 0.61–1.24)
GFR, Estimated: 13 mL/min — ABNORMAL LOW
Glucose, Bld: 85 mg/dL (ref 70–99)
Potassium: 4.3 mmol/L (ref 3.5–5.1)
Sodium: 137 mmol/L (ref 135–145)

## 2024-09-07 NOTE — Progress Notes (Signed)
 " Progress Note   Patient: Cody Silva FMW:[id number] DOB: [birth year] DOA: 08/25/2024     12 DOS: the patient was seen and examined on 09/07/2024   Brief hospital course: 79 year old man complex PMH including stroke, CKD stage V previously declined dialysis, diabetes, orthostatic hypotension with recurrent syncope, pancytopenia, blindness, presented with right flank pain and abdominal pain with vomiting.  CT showed distal right ureter stone with obstruction.  Admitted for obstructive urolithiasis, AKI on CKD stage V.  Seen by urology, underwent cystoscopy and other procedures as below.  Developed gross hematuria postoperatively and return to the operating room 2 days later for cystoscopy with clot evacuation, TURP, left stent placement.  Required transfusion.  Consultants Urology   Procedures/Events 12/22 admit for obstructive urolithiasis 12/23 Cystoscopy Right ureteroscopy with laser lithotripsy Right retrograde pyelogram Transurethral resection of prostate Right retrograde stent placement Ureteral calculus removal Fluoroscopy with intraoperative interpretation 12/25 Cystoscopy clot evacuation, TURP, left retrograde pyelogram, left ureteral stent placement   Assessment and Plan: Obstructive urolithiasis Gross hematuria  AKI superimposed on CKD stage V BPH Baseline creatinine around 4.3-5.  Admission 5.57, with peak at 6.23.   Obstructive uropathy, nephropathy, BPH.  Status post cystoscopy 12/23 with laser lithotripsy, TURP, stone removal and stent placement. Hematuria persisted despite CBI and so had repeat cystoscopy and clot evacuation 12/25.  Remains off CBI.   Continue Flomax . Completed 7 days of ceftriaxone  Creatinine slightly better today.  Has been variable, not much improvement since 12/30 Given stability, remove Foley catheter today pursue voiding trial, BMP in a.m.   Acute delirium with hallucinations and confusion PMH CVA Disequilibrium MRI was negative, B12  normal, ammonia normal Presumably secondary to acute illness.  Delirium precautions.   Chronic normocytic anemia ABLA Baseline hemoglobin around 9-10, had hematuria requiring 2 units PRBC for ABLA. Hemoglobin stable.   Hypertensive urgency Essential hypertension Stable.  Continue metoprolol , Norvasc  and clonidine .   Diabetes mellitus type 2, diet controlled Chronic pain, neuropathy Hemoglobin A1c within normal limits July 2025 Continue gabapentin    Mild chronic thrombocytopenia Stable   Incidental finding of subacute lumbar compression fracture on CT CT showed mild anterior wedging of the L2 vertebral body with increased patchy sclerosis, possibly representing healing subacute compression fracture but underlying metastasis is not excluded.   No lumbar back pain at this time and no falls reported.  Could consider bone scintigraphy or MRI.   Patient will need close outpatient follow-up with PCP   Glaucoma Blindness Continue Cosopt , Xalatan    Disposition From: Home To: SNF      Subjective:  Feels okay today.  Would like to eat a waffle.  Physical Exam: Vitals:   09/06/24 0536 09/06/24 1030 09/06/24 2019 09/07/24 0455  BP: 119/67 137/63 (!) 148/69 (!) 141/70  Pulse: (!) 54 (!) 55 (!) 58 (!) 57  Resp: 19 15 17 16   Temp: 98.5 F (36.9 C) 99 F (37.2 C) 98.1 F (36.7 C) 98.4 F (36.9 C)  TempSrc: Oral Oral Oral Oral  SpO2: 100% 100% 100% 100%  Weight:      Height:       Physical Exam Vitals reviewed.  Constitutional:      General: He is not in acute distress.    Appearance: He is not ill-appearing or toxic-appearing.  Cardiovascular:     Rate and Rhythm: Normal rate and regular rhythm.     Heart sounds: No murmur heard. Pulmonary:     Effort: Pulmonary effort is normal. No respiratory distress.  Breath sounds: No wheezing, rhonchi or rales.  Neurological:     Mental Status: He is alert.  Psychiatric:        Mood and Affect: Mood normal.         Behavior: Behavior normal.     Data Reviewed: BUN down to 38, creatinine improved to 4.53 Urine output 1250  Family Communication: none   Disposition: Status is: Inpatient Remains inpatient appropriate because: Following renal function, now Foley catheter removed, eventual SNF     Time spent: 20 minutes  Author: Toribio Door, MD 09/07/2024 7:57 AM  For on call review [email or web address]    "

## 2024-09-07 NOTE — Plan of Care (Signed)
   Problem: Health Behavior/Discharge Planning: Goal: Ability to manage health-related needs will improve Outcome: Progressing   Problem: Clinical Measurements: Goal: Ability to maintain clinical measurements within normal limits will improve Outcome: Progressing Goal: Will remain free from infection Outcome: Progressing

## 2024-09-07 NOTE — Plan of Care (Signed)

## 2024-09-07 NOTE — TOC Progression Note (Signed)
 Transition of Care [facility]) - Progression Note    Patient Details  Name: Cody Silva MRN: [id number] Date of Birth: Feb 15, 1946  Transition of Care [facility]) CM/SW Contact  Hoy DELENA Bigness, LCSW Phone Number: 09/07/2024, 9:03 AM  Clinical Narrative:    Requested new insurance auth for SNF. Auth currently pending.    Expected Discharge Plan: Skilled Nursing Facility Barriers to Discharge: Continued Medical Work up               Expected Discharge Plan and Services   Discharge Planning Services: CM Consult Post Acute Care Choice: Skilled Nursing Facility Living arrangements for the past 2 months: Skilled Nursing Facility                                       Social Drivers of Health (SDOH) Interventions SDOH Screenings   Food Insecurity: No Food Insecurity (08/26/2024)  Housing: Low Risk (08/26/2024)  Transportation Needs: No Transportation Needs (08/26/2024)  Utilities: Not At Risk (08/26/2024)  Social Connections: Moderately Integrated (08/26/2024)  Tobacco Use: Low Risk (08/26/2024)    Readmission Risk Interventions    03/26/2024   10:14 AM  Readmission Risk Prevention Plan  Transportation Screening Complete  PCP or Specialist Appt within 5-7 Days Complete  Home Care Screening Complete  Medication Review (RN CM) Complete

## 2024-09-08 LAB — BASIC METABOLIC PANEL WITH GFR
Anion gap: 8 (ref 5–15)
BUN: 41 mg/dL — ABNORMAL HIGH (ref 8–23)
CO2: 24 mmol/L (ref 22–32)
Calcium: 8.8 mg/dL — ABNORMAL LOW (ref 8.9–10.3)
Chloride: 103 mmol/L (ref 98–111)
Creatinine, Ser: 4.41 mg/dL — ABNORMAL HIGH (ref 0.61–1.24)
GFR, Estimated: 13 mL/min — ABNORMAL LOW
Glucose, Bld: 141 mg/dL — ABNORMAL HIGH (ref 70–99)
Potassium: 4.6 mmol/L (ref 3.5–5.1)
Sodium: 134 mmol/L — ABNORMAL LOW (ref 135–145)

## 2024-09-08 LAB — CBC
HCT: 21.8 % — ABNORMAL LOW (ref 39.0–52.0)
Hemoglobin: 7.3 g/dL — ABNORMAL LOW (ref 13.0–17.0)
MCH: 30.5 pg (ref 26.0–34.0)
MCHC: 33.5 g/dL (ref 30.0–36.0)
MCV: 91.2 fL (ref 80.0–100.0)
Platelets: 199 K/uL (ref 150–400)
RBC: 2.39 MIL/uL — ABNORMAL LOW (ref 4.22–5.81)
RDW: 13.8 % (ref 11.5–15.5)
WBC: 6.4 K/uL (ref 4.0–10.5)
nRBC: 0 % (ref 0.0–0.2)

## 2024-09-08 LAB — PREPARE RBC (CROSSMATCH)

## 2024-09-08 MED ORDER — SODIUM CHLORIDE 0.9% IV SOLUTION: Freq: Once | INTRAVENOUS | Status: AC

## 2024-09-08 NOTE — Plan of Care (Signed)

## 2024-09-08 NOTE — Plan of Care (Signed)

## 2024-09-08 NOTE — Progress Notes (Addendum)
" ° °  11 Days Post-Op Subjective: Cody Silva sleeping on rounds.  I did not wake him.  Objective: Vital signs in last 24 hours: Temp:  [98.2 F (36.8 C)-98.6 F (37 C)] 98.6 F (37 C) (01/05 9386) Pulse Rate:  [59-65] 59 (01/05 0613) Resp:  [16] 16 (01/05 0613) BP: (124-146)/(64-67) 124/64 (01/05 0613) SpO2:  [100 %] 100 % (01/05 9386)  Assessment/Plan: # S/p TURP with left ureteral stent  Urine clear on rounds Interval improvement in serum creatinine 6.23->5.7->5.0->4.56-->4.41. at baseline Foley/stent removed 9am 1/4. Voiding without difficulty. Continue to trend SCr. Follow up in clinic in the next couple of weeks.  Urology will sign off at this time. Please call with questions or concerns.   Intake/Output from previous day: 01/04 0701 - 01/05 0700 In: 4028.6 [P.O.:1920; I.V.:2108.6] Out: 750 [Urine:750]  Intake/Output this shift: No intake/output data recorded.  Physical Exam:  General: Alert and oriented CV: No cyanosis Lungs: equal chest rise Gu: Foley/stent removed.   Lab Results: No results for input(s): HGB, HCT in the last 72 hours.  BMET Recent Labs    09/07/24 0521 09/08/24 0452  NA 137 134*  K 4.3 4.6  CL 106 103  CO2 25 24  GLUCOSE 85 141*  BUN 38* 41*  CREATININE 4.53* 4.41*  CALCIUM  8.9 8.8*     Studies/Results: No results found.     LOS: 13 days   Ole Bourdon, NP [facility] Pager: [phone]  09/08/2024, 9:30 AM  "

## 2024-09-08 NOTE — TOC Progression Note (Signed)
 Transition of Care [facility]) - Progression Note   Patient Details  Name: Cody Silva MRN: [id number] Date of Birth: July 12, 1946  Transition of Care [facility]) CM/SW Contact  Duwaine GORMAN Aran, LCSW Phone Number: 09/08/2024, 10:09 AM  Clinical Narrative: Insurance authorization remains pending on [employer] portal. Treatment team updated.  Expected Discharge Plan: Skilled Nursing Facility Barriers to Discharge: Continued Medical Work up  Expected Discharge Plan and Services Discharge Planning Services: CM Consult Post Acute Care Choice: Skilled Nursing Facility Living arrangements for the past 2 months: Skilled Nursing Facility  Social Drivers of Health (SDOH) Interventions SDOH Screenings   Food Insecurity: No Food Insecurity (08/26/2024)  Housing: Low Risk (08/26/2024)  Transportation Needs: No Transportation Needs (08/26/2024)  Utilities: Not At Risk (08/26/2024)  Social Connections: Moderately Integrated (08/26/2024)  Tobacco Use: Low Risk (08/26/2024)   Readmission Risk Interventions    03/26/2024   10:14 AM  Readmission Risk Prevention Plan  Transportation Screening Complete  PCP or Specialist Appt within 5-7 Days Complete  Home Care Screening Complete  Medication Review (RN CM) Complete

## 2024-09-08 NOTE — Progress Notes (Signed)
 " Progress Note   Patient: Cody Silva FMW:[id number] DOB: 1946/06/08 DOA: 08/25/2024     13 DOS: the patient was seen and examined on 09/08/2024   Brief hospital course: 79 year old man complex PMH including stroke, CKD stage V previously declined dialysis, diabetes, orthostatic hypotension with recurrent syncope, pancytopenia, blindness, presented with right flank pain and abdominal pain with vomiting.  CT showed distal right ureter stone with obstruction.  Admitted for obstructive urolithiasis, AKI on CKD stage V.  Seen by urology, underwent cystoscopy and other procedures as below.  Developed gross hematuria postoperatively and return to the operating room 2 days later for cystoscopy with clot evacuation, TURP, left stent placement.  Required transfusion.  Consultants Urology   Procedures/Events 12/22 admit for obstructive urolithiasis 12/23 Cystoscopy Right ureteroscopy with laser lithotripsy Right retrograde pyelogram Transurethral resection of prostate Right retrograde stent placement Ureteral calculus removal Fluoroscopy with intraoperative interpretation 12/25 Cystoscopy clot evacuation, TURP, left retrograde pyelogram, left ureteral stent placement   Assessment and Plan: Obstructive urolithiasis Gross hematuria  AKI superimposed on CKD stage V BPH Baseline creatinine around 4.3-5.  Admission 5.57, with peak at 6.23.   Obstructive uropathy, nephropathy, BPH.  Status post cystoscopy 12/23 with laser lithotripsy, TURP, stone removal and stent placement. Hematuria persisted despite CBI and so had repeat cystoscopy and clot evacuation 12/25.  Remains off CBI.   Continue Flomax . Completed 7 days of ceftriaxone  Creatinine improved.  Foley catheter removed, voiding freely.  Follow-up with urology in a few weeks   Acute delirium with hallucinations and confusion PMH CVA Disequilibrium MRI was negative, B12 normal, ammonia normal Presumably secondary to acute illness.   Delirium precautions.   Chronic normocytic anemia ABLA Baseline hemoglobin around 9-10, had hematuria requiring 2 units PRBC for ABLA. Hemoglobin slightly lower, probably related to blood draws and chronic disease.  Will transfuse 1 unit PRBCs and check CBC in AM.   Hypertensive urgency Essential hypertension Stable.  Continue metoprolol , Norvasc  and clonidine .   Diabetes mellitus type 2, diet controlled Chronic pain, neuropathy Hemoglobin A1c within normal limits July 2025 Continue gabapentin    Mild chronic thrombocytopenia Stable   Incidental finding of subacute lumbar compression fracture on CT CT showed mild anterior wedging of the L2 vertebral body with increased patchy sclerosis, possibly representing healing subacute compression fracture but underlying metastasis is not excluded.   No lumbar back pain at this time and no falls reported.  Could consider bone scintigraphy or MRI.   Patient will need close outpatient follow-up with PCP   Glaucoma Blindness Continue Cosopt , Xalatan    Disposition From: Home To: SNF    Subjective:  Get a little dizzy when he stood up earlier today, otherwise feels okay  Physical Exam: Vitals:   09/08/24 1400 09/08/24 1414 09/08/24 1429 09/08/24 1434  BP: (!) 128/58 (!) 128/58 132/66 132/66  Pulse: 63 63 63 63  Resp: 16 16 16 16   Temp: 100.2 F (37.9 C) 100.2 F (37.9 C) 100 F (37.8 C) 100 F (37.8 C)  TempSrc: Oral Oral Oral Oral  SpO2: 100% 100% 100% 100%  Weight:      Height:       Physical Exam Vitals reviewed.  Constitutional:      General: He is not in acute distress.    Appearance: He is not ill-appearing or toxic-appearing.  Cardiovascular:     Rate and Rhythm: Normal rate and regular rhythm.     Heart sounds: No murmur heard. Pulmonary:     Effort: No respiratory distress.  Breath sounds: No wheezing, rhonchi or rales.  Neurological:     Mental Status: He is alert.  Psychiatric:        Mood and Affect:  Mood normal.        Behavior: Behavior normal.     Data Reviewed: Creatinine down to 4.41 Hemoglobin down to 7.3  Family Communication: wife Dorothe by telephone  Disposition: Status is: Inpatient Remains inpatient appropriate because: Anemia     Time spent: 35 minutes  Author: Toribio Door, MD 09/08/2024 5:25 PM  For on call review [email or web address]    "

## 2024-09-08 NOTE — Progress Notes (Signed)
 Physical Therapy Treatment Patient Details Name: Cody Silva MRN: [id number] DOB: October 15, 1945 Today's Date: 09/08/2024   History of Present Illness 79 y.o. male with presenting with complaints of severe right sided abdominal/flank pain and vomiting. Patient was found to have BPH with obstructive uropathy/nephropathy/AKI. Plan cystoscopy by urology on 12/23 with laser lithotripsy, pyelogram, TURP, stone removal and stent placement. Following this still having hematuria. PMH significant of CVA with R sided deficits, brain impairment, CKD stage V has previously declined dialysis, type 2 diabetes, hypertension, ascending thoracic aortic aneurysm, depression, gout, OSA, orthostatic hypotension, recurrent syncope, pancytopenia, blindness    PT Comments   Pt admitted with above diagnosis.  Pt currently with functional limitations due to the deficits listed below (see PT Problem List). Pt in bed when PT arrived. Pt resting and easily roused, family present. Voiding trial with foley removed. Pt required mod A and increased time for supine to sit, min A x 2 for safety with sit to stand  from EOB, min A x 2 for gait tasks 5 feet anteriorly with RW and cues, mod/max A x 2 for retrograde stepping pattern and pt fading into flexion with fatigue, pt required min A x 2 for sit to supine. Pt left in bed all needs in place.  Patient will benefit from continued inpatient follow up therapy, <3 hours/day. Pt will benefit from acute skilled PT to increase their independence and safety with mobility to allow discharge.      If plan is discharge home, recommend the following: A lot of help with bathing/dressing/bathroom;Assistance with cooking/housework;Assist for transportation;Help with stairs or ramp for entrance;Two people to help with walking and/or transfers   Can travel by private vehicle     No  Equipment Recommendations  None recommended by PT    Recommendations for Other Services       Precautions /  Restrictions Precautions Precautions: Fall Precaution/Restrictions Comments: pt blind Restrictions Weight Bearing Restrictions Per Provider Order: No     Mobility  Bed Mobility Overal bed mobility: Needs Assistance Bed Mobility: Supine to Sit, Sit to Supine     Supine to sit: Mod assist, HOB elevated Sit to supine: Min assist, +2 for physical assistance, +2 for safety/equipment   General bed mobility comments: cues and increased time for supine to sit, once in sitting L lateral and posterior lean decreased as compared to last intervention and pt able to maintain static sitting and attention to midline with CGA and cues for scooting anteriorly toward EOB, pt required min A x 2 to return to bed with A for B LE    Transfers Overall transfer level: Needs assistance Equipment used: Rolling walker (2 wheels) Transfers: Sit to/from Stand Sit to Stand: Min assist, +2 physical assistance, From elevated surface, +2 safety/equipment           General transfer comment: pt reported mild dizziness with sit to stand, pt exhibited L Lateral lean and cervcal flexion when in standing with trunk flexion and multimodal cues for upright posture and attention to midline, pt agreeable to progress with ambulation    Ambulation/Gait Ambulation/Gait assistance: Mod assist, +2 physical assistance, +2 safety/equipment Gait Distance (Feet): 5 Feet Assistive device: Rolling walker (2 wheels) Gait Pattern/deviations: Step-to pattern Gait velocity: decreased     General Gait Details: pt able to perform anterior gait pattern with min A x 2 cues and RW and pt required mod/max A x 2 for retrograde stepping pattern with pt demonstrating flexed posture and decreased motor control and  coordination with B LE requiring A for eccentric control to EOB   Stairs             Wheelchair Mobility     Tilt Bed    Modified Rankin (Stroke Patients Only)       Balance Overall balance assessment: Needs  assistance Sitting-balance support: Feet supported Sitting balance-Leahy Scale: Fair Sitting balance - Comments: slight L lateral lean in sitting no overt LOB   Standing balance support: Bilateral upper extremity supported, During functional activity, Reliant on assistive device for balance Standing balance-Leahy Scale: Poor Standing balance comment: + 2 to remain upright with UE support                            Communication Communication Communication: No apparent difficulties  Cognition Arousal: Alert Behavior During Therapy: Flat affect                           PT - Cognition Comments: family present, pt alert and communicative Following commands: Impaired Following commands impaired: Follows one step commands with increased time, Only follows one step commands consistently    Cueing Cueing Techniques: Verbal cues, Tactile cues  Exercises      General Comments        Pertinent Vitals/Pain Pain Assessment Pain Assessment: No/denies pain    Home Living                          Prior Function            PT Goals (current goals can now be found in the care plan section) Acute Rehab PT Goals Patient Stated Goal: I want to be able to go home safe. Family stated they want him to be able to move better in order to care for him at home. Progress towards PT goals: Progressing toward goals    Frequency    Min 2X/week      PT Plan      Co-evaluation              AM-PAC PT 6 Clicks Mobility   Outcome Measure  Help needed turning from your back to your side while in a flat bed without using bedrails?: A Lot Help needed moving from lying on your back to sitting on the side of a flat bed without using bedrails?: A Lot Help needed moving to and from a bed to a chair (including a wheelchair)?: A Lot Help needed standing up from a chair using your arms (e.g., wheelchair or bedside chair)?: A Lot Help needed to walk in  hospital room?: A Lot Help needed climbing 3-5 steps with a railing? : Total 6 Click Score: 11    End of Session Equipment Utilized During Treatment: Gait belt Activity Tolerance: Patient limited by fatigue Patient left: with call bell/phone within reach;in bed;with family/visitor present Nurse Communication: Mobility status PT Visit Diagnosis: Other abnormalities of gait and mobility (R26.89);Muscle weakness (generalized) (M62.81)     Time: 8880-8851 PT Time Calculation (min) (ACUTE ONLY): 29 min  Charges:    $Gait Training: 8-22 mins $Therapeutic Activity: 8-22 mins PT General Charges $$ ACUTE PT VISIT: 1 Visit                     [place], PT Acute Rehab    [place] VEAR Drone 09/08/2024, 6:14 PM

## 2024-09-09 LAB — BPAM RBC
Blood Product Expiration Date: 202602022359
ISSUE DATE / TIME: 202601051416
Unit Type and Rh: 5100

## 2024-09-09 LAB — CBC
HCT: 24.3 % — ABNORMAL LOW (ref 39.0–52.0)
Hemoglobin: 8.2 g/dL — ABNORMAL LOW (ref 13.0–17.0)
MCH: 30.4 pg (ref 26.0–34.0)
MCHC: 33.7 g/dL (ref 30.0–36.0)
MCV: 90 fL (ref 80.0–100.0)
Platelets: 201 K/uL (ref 150–400)
RBC: 2.7 MIL/uL — ABNORMAL LOW (ref 4.22–5.81)
RDW: 13.5 % (ref 11.5–15.5)
WBC: 9.7 K/uL (ref 4.0–10.5)
nRBC: 0 % (ref 0.0–0.2)

## 2024-09-09 LAB — TYPE AND SCREEN
ABO/RH(D): O POS
Antibody Screen: NEGATIVE
Unit division: 0

## 2024-09-09 NOTE — Plan of Care (Signed)

## 2024-09-09 NOTE — TOC Progression Note (Signed)
 Transition of Care [facility]) - Progression Note    Patient Details  Name: Cody Silva MRN: [id number] Date of Birth: 03-Dec-1945  Transition of Care [facility]) CM/SW Contact  Alfonse JONELLE Rex, RN Phone Number: 09/09/2024, 10:45 AM  Clinical Narrative:   Per [facility], SNF auth approved. Plan Auth ID [id number], Auth ID [id number], days approved 1/5 to 09/10/24, team notified.      Expected Discharge Plan: Skilled Nursing Facility Barriers to Discharge: Continued Medical Work up               Expected Discharge Plan and Services   Discharge Planning Services: CM Consult Post Acute Care Choice: Skilled Nursing Facility Living arrangements for the past 2 months: Skilled Nursing Facility                                       Social Drivers of Health (SDOH) Interventions SDOH Screenings   Food Insecurity: No Food Insecurity (08/26/2024)  Housing: Low Risk (08/26/2024)  Transportation Needs: No Transportation Needs (08/26/2024)  Utilities: Not At Risk (08/26/2024)  Social Connections: Moderately Integrated (08/26/2024)  Tobacco Use: Low Risk (08/26/2024)    Readmission Risk Interventions    09/08/2024   10:10 AM 03/26/2024   10:14 AM  Readmission Risk Prevention Plan  Transportation Screening Complete Complete  PCP or Specialist Appt within 5-7 Days  Complete  Home Care Screening Complete Complete  Medication Review (RN CM) Complete Complete

## 2024-09-09 NOTE — Progress Notes (Signed)
 " Progress Note   Patient: Cody Silva FMW:[id number] DOB: [birth year] DOA: 08/25/2024     14 DOS: the patient was seen and examined on 09/09/2024   Brief hospital course: 79 year old man complex PMH including stroke, CKD stage V previously declined dialysis, diabetes, orthostatic hypotension with recurrent syncope, pancytopenia, blindness, presented with right flank pain and abdominal pain with vomiting.  CT showed distal right ureter stone with obstruction.  Admitted for obstructive urolithiasis, AKI on CKD stage V.  Seen by urology, underwent cystoscopy and other procedures as below.  Developed gross hematuria postoperatively and return to the operating room 2 days later for cystoscopy with clot evacuation, TURP, left stent placement.  Required transfusion.  Condition subsequently stabilized.  Renal function has been very slow to improve but Foley catheter was successfully removed and he is voiding spontaneously.  Plan transfer to SNF.  Consultants Urology   Procedures/Events 12/22 admit for obstructive urolithiasis 12/23 Cystoscopy Right ureteroscopy with laser lithotripsy Right retrograde pyelogram Transurethral resection of prostate Right retrograde stent placement Ureteral calculus removal Fluoroscopy with intraoperative interpretation 12/25 Cystoscopy clot evacuation, TURP, left retrograde pyelogram, left ureteral stent placement   Assessment and Plan: Obstructive urolithiasis Gross hematuria  AKI superimposed on CKD stage V BPH Baseline creatinine around 4.3-5.  Admission 5.57, with peak at 6.23.   Obstructive uropathy, nephropathy, BPH.  Status post cystoscopy 12/23 with laser lithotripsy, TURP, stone removal and stent placement. Hematuria persisted despite CBI and so had repeat cystoscopy and clot evacuation 12/25.  Remains off CBI.   Continue Flomax . Completed 7 days of ceftriaxone  Creatinine improving.  Foley catheter removed, voiding freely.  Follow-up with urology  in a few weeks   Acute delirium with hallucinations and confusion PMH CVA Disequilibrium MRI was negative, B12 normal, ammonia normal Presumably secondary to acute illness.  Delirium precautions.   Chronic normocytic anemia ABLA Baseline hemoglobin around 9-10, had hematuria requiring 2 units PRBC for ABLA. Hemoglobin slightly lower, probably related to blood draws and chronic disease.  Will transfuse 1 unit PRBCs and check CBC in AM.   Hypertensive urgency Essential hypertension Stable.  Continue metoprolol , Norvasc  and clonidine .   Diabetes mellitus type 2, diet controlled Chronic pain, neuropathy Hemoglobin A1c within normal limits July 2025 Continue gabapentin    Mild chronic thrombocytopenia Stable   Incidental finding of subacute lumbar compression fracture on CT CT showed mild anterior wedging of the L2 vertebral body with increased patchy sclerosis, possibly representing healing subacute compression fracture but underlying metastasis is not excluded.   No lumbar back pain at this time and no falls reported.  Could consider bone scintigraphy or MRI.   Patient will need close outpatient follow-up with PCP   Glaucoma Blindness Continue Cosopt , Xalatan    Disposition From: Home To: SNF    Subjective:  Feels ok  Physical Exam: Vitals:   09/08/24 1715 09/08/24 2049 09/09/24 0531 09/09/24 1000  BP: 125/65 (!) 141/69 (!) 142/65   Pulse: 61 65 60   Resp: 16 15 16    Temp: 99.3 F (37.4 C) 98.8 F (37.1 C) 99.5 F (37.5 C) 100 F (37.8 C)  TempSrc: Oral Oral Oral Oral  SpO2: 100% 100% 100%   Weight:      Height:       Physical Exam Vitals reviewed.  Constitutional:      General: He is not in acute distress.    Appearance: He is not ill-appearing or toxic-appearing.  Cardiovascular:     Rate and Rhythm: Normal rate and regular rhythm.  Heart sounds: No murmur heard. Pulmonary:     Effort: Pulmonary effort is normal. No respiratory distress.     Breath  sounds: No wheezing, rhonchi or rales.  Neurological:     Mental Status: He is alert.  Psychiatric:        Mood and Affect: Mood normal.        Behavior: Behavior normal.     Data Reviewed: Hgb 8.2  Family Communication: none  Disposition: Status is: Inpatient Remains inpatient appropriate because: 20 minutes     Time spent: 35 minutes  Author: Toribio Door, MD 09/09/2024 8:35 PM  For on call review [email or web address].    "

## 2024-09-10 MED ORDER — AMLODIPINE BESYLATE 10 MG PO TABS: 10.0000 mg | ORAL_TABLET | Freq: Every day | ORAL | Status: AC

## 2024-09-10 NOTE — Progress Notes (Signed)
 Physical Therapy Treatment Patient Details Name: Cody Silva MRN: [id number] DOB: July 18, 1946 Today's Date: 09/10/2024   History of Present Illness 79 y.o. male with presenting with complaints of severe right sided abdominal/flank pain and vomiting. Patient was found to have BPH with obstructive uropathy/nephropathy/AKI. Plan cystoscopy by urology on 12/23 with laser lithotripsy, pyelogram, TURP, stone removal and stent placement. Following this still having hematuria. PMH significant of CVA with R sided deficits, brain impairment, CKD stage V has previously declined dialysis, type 2 diabetes, hypertension, ascending thoracic aortic aneurysm, depression, gout, OSA, orthostatic hypotension, recurrent syncope, pancytopenia, blindness    PT Comments   Pt admitted with above diagnosis.  Pt currently with functional limitations due to the deficits listed below (see PT Problem List). Pt in bed when PT arrived. Pt resting and easily roused. Pt agreeable to therapy intervention. PT able to assist pt to EOB with min A, increased time, cues and hospital bed features, OT arrived when Pt seated EOB, pt required min A for sit to stand  from EOB to RW, during the course of transfer tasks pt had a loose bowel event requiring therapist to move bed out of the way, place BSC under pt, pt able to sit on [facility] and complete voiding bowels, pt required extensive assist for hygiene and ADL task and became increasingly fatigued with prolonged sitting, mod A x 2 for sit to stand  from [facility] for peri care, pt unable to safely maintain standing with RW and mod/max A x 2 nor complete SPT to recliner or retrograde steps to return to bed thus therapist max A pt to standing while BSC was removed and bed placed behind pt for switch transfer and pt required mod A x 2 to return to bed. Pt left in bed, all needs in place and nursing staff present.  Patient will benefit from continued inpatient follow up therapy, <3 hours/day. Pt will benefit  from acute skilled PT to increase their independence and safety with mobility to allow discharge.      If plan is discharge home, recommend the following: A lot of help with bathing/dressing/bathroom;Assistance with cooking/housework;Assist for transportation;Help with stairs or ramp for entrance;Two people to help with walking and/or transfers   Can travel by private vehicle     No  Equipment Recommendations  None recommended by PT    Recommendations for Other Services       Precautions / Restrictions Precautions Precautions: Fall Precaution/Restrictions Comments: pt blind Restrictions Weight Bearing Restrictions Per Provider Order: No     Mobility  Bed Mobility Overal bed mobility: Needs Assistance Bed Mobility: Supine to Sit, Sit to Supine     Supine to sit: HOB elevated, Min assist Sit to supine: +2 for physical assistance, +2 for safety/equipment, Mod assist   General bed mobility comments: cues and increased time for supine to sit, once in sitting no L lateral and posterior lean and pt able to maintain static sitting and attention to midline with CGA and cues for scooting anteriorly toward EOB, pt required mod A x 2 to return to bed with A for B LE due to fatigue    Transfers Overall transfer level: Needs assistance Equipment used: Rolling walker (2 wheels) Transfers: Sit to/from Stand Sit to Stand: Min assist, +2 physical assistance, From elevated surface, +2 safety/equipment           General transfer comment: min A x 2 for sit to stand from EOB, with pt able to push with R UE from  EOB, pt able to obtain upright standing however during the course of transfer task pt had a loose bowel event requireing total A for hygiene and ADLs, pt able to tolerate sitting on BSC and maintained attention to midline with flexed posture sit to stand attempt from [facility] with mod/max A x 2 and noted LE instability when transitioning to RW and unable to safely mantain standing for SPT to  recliner and returned to [facility] s/p peri care. pt required max A x 1 with bear hug technique with OT and nurse present to [facility] and place hosptial bed for switch transfer to return to bed pt unable to perform retrograde stepping pattern today with max A    Ambulation/Gait               General Gait Details: NT   Stairs             Wheelchair Mobility     Tilt Bed    Modified Rankin (Stroke Patients Only)       Balance Overall balance assessment: Needs assistance Sitting-balance support: Feet supported Sitting balance-Leahy Scale: Fair     Standing balance support: Bilateral upper extremity supported, During functional activity, Reliant on assistive device for balance Standing balance-Leahy Scale: Poor Standing balance comment: + 2 to remain upright with UE support at [employer] Communication Communication: No apparent difficulties  Cognition Arousal: Alert Behavior During Therapy: Flat affect   PT - Cognitive impairments: No family/caregiver present to determine baseline                         Following commands: Impaired Following commands impaired: Follows one step commands with increased time, Only follows one step commands consistently    Cueing Cueing Techniques: Verbal cues, Tactile cues  Exercises      General Comments        Pertinent Vitals/Pain      Home Living Family/patient expects to be discharged to:: Private residence                   Additional Comments: 1 fall 3 months ago from pt reportedly passing out.    Prior Function            PT Goals (current goals can now be found in the care plan section) Acute Rehab PT Goals Patient Stated Goal: I want to be able to go home safe. Family stated they want him to be able to move better in order to care for him at home. PT Goal Formulation: With patient Time For Goal Achievement: 09/23/24 Potential to Achieve  Goals: Fair Progress towards PT goals: Progressing toward goals    Frequency    Min 2X/week      PT Plan      Co-evaluation PT/OT/SLP Co-Evaluation/Treatment: Yes Reason for Co-Treatment: Complexity of the patient's impairments (multi-system involvement);Necessary to address cognition/behavior during functional activity;For patient/therapist safety PT goals addressed during session: Mobility/safety with mobility;Balance;Proper use of DME OT goals addressed during session: ADL's and self-care;Proper use of Adaptive equipment and DME;Strengthening/ROM      AM-PAC PT 6 Clicks Mobility   Outcome Measure  Help needed turning from your back to your side while in a flat bed without using bedrails?: A Lot Help needed moving from lying on your back to sitting on the  side of a flat bed without using bedrails?: A Lot Help needed moving to and from a bed to a chair (including a wheelchair)?: A Lot Help needed standing up from a chair using your arms (e.g., wheelchair or bedside chair)?: A Lot Help needed to walk in hospital room?: A Lot Help needed climbing 3-5 steps with a railing? : Total 6 Click Score: 11    End of Session Equipment Utilized During Treatment: Gait belt Activity Tolerance: Patient limited by fatigue Patient left: with call bell/phone within reach;in bed;with bed alarm set;with nursing/sitter in room Nurse Communication: Mobility status PT Visit Diagnosis: Other abnormalities of gait and mobility (R26.89);Muscle weakness (generalized) (M62.81)     Time: 8876-8783 PT Time Calculation (min) (ACUTE ONLY): 53 min  Charges:    $Therapeutic Activity: 23-37 mins PT General Charges $$ ACUTE PT VISIT: 1 Visit                     [place], PT Acute Rehab    [place] VEAR Drone 09/10/2024, 1:03 PM

## 2024-09-10 NOTE — Progress Notes (Signed)
 Occupational Therapy Treatment Patient Details Name: Cody Silva MRN: [id number] DOB: 1946/07/24 Today's Date: 09/10/2024   History of present illness 79 y.o. male with presenting with complaints of severe right sided abdominal/flank pain and vomiting. Patient was found to have BPH with obstructive uropathy/nephropathy/AKI. Plan cystoscopy by urology on 12/23 with laser lithotripsy, pyelogram, TURP, stone removal and stent placement. Following this still having hematuria. PMH significant of CVA with R sided deficits, brain impairment, CKD stage V has previously declined dialysis, type 2 diabetes, hypertension, ascending thoracic aortic aneurysm, depression, gout, OSA, orthostatic hypotension, recurrent syncope, pancytopenia, blindness   OT comments  Pt received sitting EOB with PT. Pt with improved orientation to midline and static sitting balance, continues to require +2 for all safe mobility attempts and MAX-TOTAL for ADL performance. MIN A +2 to perform STS from elevated bed height, hand-over-hand placement on RW required, pt with large amount of loose BM, requiring movement of bed to switch out for [facility]. Pt sits on [facility] for extended period of time for multiple BM. Requires extensive assist for hygiene, UB and LB bathing dressing (MAX-TOTAL with constant tactile cues for initiation + attn to task). Pt too fatigued to attempt transfer to recliner with noted BLE instability with MOD A +2 after standing pericare, opted to swap BSC > bed. Discharge recommendation appropriate. Patient will benefit from continued inpatient follow up therapy, <3 hours/day       If plan is discharge home, recommend the following:  Two people to help with walking and/or transfers;A lot of help with bathing/dressing/bathroom;Assistance with cooking/housework;Assistance with feeding;Direct supervision/assist for medications management;Direct supervision/assist for financial management;Assist for transportation;Help with  stairs or ramp for entrance;Supervision due to cognitive status   Equipment Recommendations  Wheelchair (measurements OT);Wheelchair cushion (measurements OT);BSC/3in1;Tub/shower seat       Precautions / Restrictions Precautions Precautions: Fall Precaution/Restrictions Comments: pt blind Restrictions Weight Bearing Restrictions Per Provider Order: No       Mobility Bed Mobility Overal bed mobility: Needs Assistance Bed Mobility: Supine to Sit, Sit to Supine     Supine to sit: HOB elevated, Min assist Sit to supine: +2 for physical assistance, +2 for safety/equipment, Mod assist   General bed mobility comments: improved static sitting once EOB. improved midline orientation and cervical control. pt tends to maintain kyphotic posture. pt recieved sitting EOB with PT.    Transfers Overall transfer level: Needs assistance Equipment used: Rolling walker (2 wheels) Transfers: Sit to/from Stand Sit to Stand: Min assist, +2 physical assistance, From elevated surface, +2 safety/equipment           General transfer comment: MIN A +2 for inital STS from EOB, tactile cues for hand placement and anterior weight shift. Pt with sudden loose bowel during standing, bed moved and able to place [facility] behind pt for him to sit. Pt sat on BSC for extended period of time, fatiguing after multiple loose BMs. able to stand with MOD - MAX A +2 with noted BLE instability when attempting to stand and transfer to recliner. returned to [facility] and was MAX A with face-to-face standing to swap out [facility] for hospital bed     Balance Overall balance assessment: Needs assistance Sitting-balance support: Feet supported Sitting balance-Leahy Scale: Fair     Standing balance support: Bilateral upper extremity supported, During functional activity, Reliant on assistive device for balance Standing balance-Leahy Scale: Poor Standing balance comment: + 2 to remain upright with UE support at [facility]  ADL either performed or assessed with clinical judgement   ADL Overall ADL's : Needs assistance/impaired             Lower Body Bathing: Sit to/from stand;Maximal assistance;Total assistance Lower Body Bathing Details (indicate cue type and reason): MAX - TOTAL A for LB bathing, pt needs constant tactile cues (pt blind at baseline) to assist by elevating legs Upper Body Dressing : Maximal assistance;Sitting Upper Body Dressing Details (indicate cue type and reason): doff / don gown Lower Body Dressing: Maximal assistance;Total assistance;Sit to/from stand;Sitting/lateral leans Lower Body Dressing Details (indicate cue type and reason): MAX - TOTAL A to doff and don socks. Tactile cues to hold foot up for sock to be placed Toilet Transfer: Moderate assistance;Minimal assistance;+2 for physical assistance;+2 for safety/equipment;BSC/3in1 Toilet Transfer Details (indicate cue type and reason): pt had sudden loose BM upon standing attempt, requring bed to be swapped for [facility]. Toileting- Clothing Manipulation and Hygiene: Sit to/from stand;Maximal assistance;Total assistance;+2 for physical assistance;+2 for safety/equipment Toileting - Clothing Manipulation Details (indicate cue type and reason): TOTAL A for standing pericare     Functional mobility during ADLs: +2 for safety/equipment;+2 for physical assistance;Moderate assistance;Minimal assistance General ADL Comments: requires max tactile cues 2/2 vision deficits, MAX - TOTAL for UB/ADLs    Extremity/Trunk Assessment         Cervical / Trunk Assessment Cervical / Trunk Assessment: Other exceptions (severe L lean and trunk preference)             Communication Communication Communication: No apparent difficulties   Cognition Arousal: Alert Behavior During Therapy: Flat affect                                 Following commands: Impaired Following commands impaired: Follows one step commands with  increased time, Only follows one step commands consistently      Cueing   Cueing Techniques: Verbal cues, Tactile cues    Home Living Family/patient expects to be discharged to:: Private residence                                 Additional Comments: 1 fall 3 months ago from pt reportedly passing out.          Frequency  Min 2X/week        Progress Toward Goals  OT Goals(current goals can now be found in the care plan section)  Progress towards OT goals: Progressing toward goals     Plan      Co-evaluation      Reason for Co-Treatment: Complexity of the patient's impairments (multi-system involvement);Necessary to address cognition/behavior during functional activity;For patient/therapist safety PT goals addressed during session: Mobility/safety with mobility;Balance;Proper use of DME OT goals addressed during session: ADL's and self-care;Proper use of Adaptive equipment and DME;Strengthening/ROM      AM-PAC OT 6 Clicks Daily Activity     Outcome Measure   Help from another person eating meals?: A Lot Help from another person taking care of personal grooming?: A Lot Help from another person toileting, which includes using toliet, bedpan, or urinal?: Total Help from another person bathing (including washing, rinsing, drying)?: Total Help from another person to put on and taking off regular upper body clothing?: Total Help from another person to put on and taking off regular lower body clothing?: Total 6 Click Score: 8    End of  Session Equipment Utilized During Treatment: Rolling walker (2 wheels)  OT Visit Diagnosis: Unsteadiness on feet (R26.81);Other abnormalities of gait and mobility (R26.89);Muscle weakness (generalized) (M62.81);Low vision, both eyes (H54.2);Cognitive communication deficit (R41.841) Symptoms and signs involving cognitive functions: Cerebral infarction   Activity Tolerance Patient tolerated treatment well   Patient Left  in bed;with call bell/phone within reach;with bed alarm set;with nursing/sitter in room   Nurse Communication Mobility status        Time: 8867-8784 OT Time Calculation (min): 43 min  Charges: OT General Charges $OT Visit: 1 Visit OT Treatments $Self Care/Home Management : 23-37 mins  Ardie Mclennan L. Kit Brubacher, OTR/L  09/10/2024, 2:49 PM

## 2024-09-10 NOTE — Discharge Summary (Addendum)
 "  Physician Discharge Summary  Cody Silva FMW:[id number] DOB: 06-29-1946 DOA: 08/25/2024  [facility]: [facility]  Admit date: 08/25/2024 Discharge date: 09/10/2024  Admitted from: Home Discharge disposition: SNF   Subjective: Patient was seen and examined this afternoon. Patient elderly African-American male.  Lying on bed.  Not in distress.  No family at bedside.  Brief narrative: Cody Silva is a 79 y.o. male with PMH significant for DM2, HLD, stroke, CKD stage V previously declined dialysis, orthostatic hypotension with recurrent syncope, pancytopenia, blindness,   12/22, patient presented with right flank pain and abdominal pain with vomiting.   CT showed distal right ureter stone with obstruction.   Admitted to [facility] for obstructive urolithiasis, AKI on CKD stage V.  Seen by urology, underwent cystoscopy, right ureteroscopy, laser lithotripsy, TURP, right retrograde stent placement and ureteral calculus removal. Developed gross hematuria postoperatively and had to be turned to the operating room 2 days later for cystoscopy with clot evacuation, TURP, left stent placement.  Required transfusion.  Condition subsequently stabilized.  Renal function has been very slow to improve but Foley catheter was successfully removed and he is voiding spontaneously.   Plan transfer to SNF.   Hospital course: Obstructive urolithiasis Gross hematuria  BPH Presented with obstructive uropathy, nephropathy, BPH.   Status post cystoscopy 12/23 with laser lithotripsy, TURP, stone removal and stent placement. Hematuria persisted despite CBI and so had repeat cystoscopy and clot evacuation 12/25.   Hematuria gradually improved.  Taken off CBI.   Foley catheter has been removed.  Able to void freely. Continue Flomax . Completed 7 days of ceftriaxone  Follow-up with urology in a few weeks.   AKI superimposed on CKD stage V Baseline creatinine around 4.3-5.  Admission 5.57, with peak  at 6.23.   Creatinine has gradually improved and he is back to baseline close to 4.5. Follows up with nephrology with [facility] Recent Labs  Lab 09/04/24 0520 09/05/24 0503 09/06/24 9147 09/07/24 0521 09/08/24 0452  NA 141 140 138 137 134*  K 3.5 3.4* 3.9 4.3 4.6  CL 111 109 108 106 103  CO2 23 23 24 25 24   GLUCOSE 95 97 114* 85 141*  BUN 49* 44* 44* 38* 41*  CREATININE 4.85* 4.94* 4.76* 4.53* 4.41*  CALCIUM  8.8* 8.9 8.7* 8.9 8.8*   Acute delirium with hallucinations and confusion H/o CVA MRI was negative, B12 normal, ammonia normal Presumably secondary to acute illness.  Delirium precautions. Mental status gradually improved. Continue Plavix , statin   Acute blood loss anemia chronic normocytic anemia Baseline hemoglobin around 9-10. Because of hematuria, hemoglobin dropped to the lowest of 7.3  Received a total of 3 units of PRBC transfusion Hemoglobin trend as below. Recent Labs    03/26/24 0216 03/26/24 1026 03/26/24 1129 03/27/24 0212 08/31/24 0544 09/01/24 0514 09/01/24 1205 09/02/24 0516 09/08/24 0452 09/09/24 0503  HGB 8.7*  --   --    < > 8.2* 8.2*  --  8.1* 7.3* 8.2*  MCV 91.5  --   --    < > 90.1 90.2  --  89.6 91.2 90.0  VITAMINB12  --   --  492  --   --   --  813  --   --   --   FERRITIN 55  --   --   --   --   --   --   --   --   --   TIBC 186*  --   --   --   --   --   --   --   --   --  IRON 51  --   --   --   --   --   --   --   --   --   RETICCTPCT  --  0.8  --   --   --   --   --   --   --   --    < > = values in this interval not displayed.    Hypertensive urgency Essential hypertension Blood pressure is currently controlled on metoprolol , Norvasc  and clonidine .   Diabetes mellitus type 2, diet controlled Chronic pain, neuropathy Hemoglobin A1c within normal limits July 2025 Continue gabapentin    Mild chronic thrombocytopenia Stable   Incidental finding of subacute lumbar compression fracture on CT CT showed mild anterior wedging of the  L2 vertebral body with increased patchy sclerosis, possibly representing healing subacute compression fracture but underlying metastasis is not excluded.   No lumbar back pain at this time and no falls reported.  Could consider bone scintigraphy or MRI.   Patient will need close outpatient follow-up with PCP   Glaucoma Blindness Continue Cosopt , Xalatan   Goals of care   Code Status: Full Code   Diet:  Diet Order             Diet general           Diet renal/carb modified with fluid restriction Diet-HS Snack? Nothing; Room service appropriate? Yes; Fluid consistency: Thin  Diet effective now                   Nutritional status:  Body mass index is 18.6 kg/m.       Wounds:  - Wound 08/28/24 1011 Surgical Closed Surgical Incision Penis Other (Comment) (Active)  Date First Assessed/Time First Assessed: 08/28/24 1011   Primary Wound Type: Surgical  Secondary Wound Type - Surgical: Closed Surgical Incision  Location: Penis  Location Orientation: Other (Comment)    Assessments 08/28/2024  8:00 PM 09/03/2024 10:12 AM  Wound Length (cm) -- 0 cm  Wound Width (cm) -- 0 cm  Wound Surface Area (cm^2) -- 0 cm^2  Wound Depth (cm) -- 0 cm  Wound Volume (cm^3) -- 0 cm^3  Drainage Amount None --     No associated orders.    Discharge Medications:   Allergies as of 09/10/2024       Reactions   Metformin And Related Diarrhea   Felodipine Swelling, Other (See Comments)   Edema    Lactose Intolerance (gi) Other (See Comments)   Gi upset   Pseudoephedrine-acetaminophen  Nausea And Vomiting   Terazosin Other (See Comments)   Syncope   Tylenol  With Codeine #3 [acetaminophen -codeine] Nausea Only, Other (See Comments)   Patient can take regular Tylenol  with no problems, however   Tape Rash        Medication List     STOP taking these medications    cloNIDine  0.1 mg/24hr patch Commonly known as: CATAPRES  - Dosed in mg/24 hr       TAKE these medications     acetaminophen  500 MG tablet Commonly known as: TYLENOL  Take 1 tablet (500 mg total) by mouth every 6 (six) hours as needed for mild pain, moderate pain, fever or headache. What changed:  how much to take when to take this additional instructions   albuterol  108 (90 Base) MCG/ACT inhaler Commonly known as: VENTOLIN  HFA Inhale 1 puff into the lungs every 6 (six) hours as needed for wheezing or shortness of breath.   Alka-Seltzer [phone] MG Tbef  tablet Generic drug: aspirin -sod bicarb-citric acid Take 325 mg by mouth every 6 (six) hours as needed (dissolve as directed- for indigestion).   amLODipine  10 MG tablet Commonly known as: NORVASC  Take 1 tablet (10 mg total) by mouth at bedtime. What changed:  medication strength how much to take   ammonium lactate 12 % lotion Commonly known as: LAC-HYDRIN Apply 1 application topically as needed for dry skin.   atorvastatin  40 MG tablet Commonly known as: LIPITOR Take 40 mg by mouth at bedtime.   calcitRIOL  0.25 MCG capsule Commonly known as: ROCALTROL  Take 0.25 mcg by mouth daily.   clopidogrel  75 MG tablet Commonly known as: PLAVIX  Take 1 tablet (75 mg total) by mouth at bedtime. What changed: when to take this   dorzolamide -timolol  2-0.5 % ophthalmic solution Commonly known as: COSOPT  Place 1 drop into both eyes 2 (two) times daily.   ERGOCALCIFEROL  PO Take 1,000 Units by mouth daily.   escitalopram  10 MG tablet Commonly known as: LEXAPRO  Take 10 mg by mouth at bedtime.   ferrous sulfate  325 (65 FE) MG tablet Take 325 mg by mouth every Monday, Wednesday, and Friday.   fluticasone  50 MCG/ACT nasal spray Commonly known as: FLONASE  Place 1 spray into both nostrils daily as needed for allergies or rhinitis.   gabapentin  300 MG capsule Commonly known as: NEURONTIN  Take 300 mg by mouth at bedtime.   hydrALAZINE  10 MG tablet Commonly known as: APRESOLINE  TAKE HYDRALAZINE  10 MG BY MOUTH TWICE DAILY AS NEEDED  IF BLOOD PRESSURE IS 160/100 What changed:  how much to take how to take this when to take this reasons to take this additional instructions   latanoprost  0.005 % ophthalmic solution Commonly known as: XALATAN  Place 1 drop into both eyes at bedtime.   tamsulosin  0.4 MG Caps capsule Commonly known as: FLOMAX  Take 1 capsule (0.4 mg total) by mouth daily.   thiamine  100 MG tablet Commonly known as: Vitamin B-1 Take 1 tablet (100 mg total) by mouth daily.               Discharge Care Instructions  (From admission, onward)           Start     Ordered   09/10/24 0000  Discharge wound care:        09/10/24 1509             Follow ups:    Contact information for follow-up providers     Shane Steffan BROCKS, MD. Schedule an appointment as soon as possible for a visit in 2 week(s).   Specialty: Urology Contact information: [address] [place] [place] [address] [phone]         [facility] Schedule an appointment as soon as possible for a visit in 1 week(s).   Contact information: [address] [facility] [place] [address] [phone]              Contact information for after-discharge care     Destination     [facility]   Service: Skilled Nursing Contact information: [address] [place]  [address] [phone]                     Discharge Instructions:   Discharge Instructions     Call MD for:  difficulty breathing, headache or visual disturbances   Complete by: As directed    Call MD for:  extreme fatigue   Complete  by: As directed    Call MD for:  hives   Complete by: As directed    Call MD for:  persistant dizziness or light-headedness   Complete by: As directed    Call MD for:  persistant nausea and vomiting   Complete by: As directed    Call MD for:  severe uncontrolled pain   Complete by: As directed    Call MD for:   temperature >100.4   Complete by: As directed    Diet general   Complete by: As directed    Discharge wound care:   Complete by: As directed    Increase activity slowly   Complete by: As directed        Discharge Exam:   Vitals:   09/09/24 2100 09/09/24 2200 09/10/24 0603 09/10/24 1415  BP: 129/61 (!) 129/58 136/66 128/62  Pulse:  62 60 69  Resp:  16 14 18   Temp:  100 F (37.8 C) 98 F (36.7 C) 98.5 F (36.9 C)  TempSrc:  Oral Oral Oral  SpO2:  99% 100% 100%  Weight:      Height:        Body mass index is 18.6 kg/m.  General exam: Pleasant, elderly African-American male Skin: No rashes, lesions or ulcers. HEENT: Atraumatic, normocephalic, no obvious bleeding Lungs: Clear to auscultation bilaterally,  CVS: S1, S2, no murmur,   GI/Abd: Soft, nontender, nondistended, bowel sound present,   CNS: Alert, awake, oriented to place and person Psychiatry: Mood appropriate Extremities: No pedal edema, no calf tenderness,    The results of significant diagnostics from this hospitalization (including imaging, microbiology, ancillary and laboratory) are listed below for reference.    Procedures and Diagnostic Studies:   DG C-Arm 1-60 Min-No Report Result Date: 08/26/2024 Fluoroscopy was utilized by the requesting physician.  No radiographic interpretation.   DG C-Arm 1-60 Min-No Report Result Date: 08/26/2024 Fluoroscopy was utilized by the requesting physician.  No radiographic interpretation.   CT ABDOMEN PELVIS WO CONTRAST Result Date: 08/26/2024 EXAM: CT ABDOMEN AND PELVIS WITHOUT CONTRAST 08/26/2024 12:20:20 AM TECHNIQUE: CT of the abdomen and pelvis was performed without the administration of intravenous contrast. Multiplanar reformatted images are provided for review. Automated exposure control, iterative reconstruction, and/or weight-based adjustment of the mA/kV was utilized to reduce the radiation dose to as low as reasonably achievable. COMPARISON: None available.  CLINICAL HISTORY: Abdominal/flank pain, stone suspected. Right lower quadrant pain with onset last night with nausea and vomiting. FINDINGS: LOWER CHEST: The lung bases are clear of infiltrates. There is mild elevation of the right hemidiaphragm. The heart is slightly enlarged. There is linear scarring or atelectasis in both lower lobes. LIVER: There are calcified granulomas in the left lobe of the liver with no other focal abnormality of the unenhanced liver. GALLBLADDER AND BILE DUCTS: There is layering low attenuation which could be sludge or noncalcified stones in the gallbladder without wall thickening or biliary dilatation. SPLEEN: No acute abnormality. PANCREAS: No acute abnormality. ADRENAL GLANDS: No acute abnormality. KIDNEYS, URETERS AND BLADDER: There is no contour deforming mass of the unenhanced kidneys. There is a Bosniak 1 cyst posteriorly in the left kidney measuring 1.3 cm and 16 HU units. Per consensus, no follow-up is needed for simple Bosniak type 1 and 2 renal cysts, unless the patient has a malignancy history or risk factors. On the right, there is both renal and perirenal edema, peripelvic and periureteral stranding edema, and moderate hydroureteronephrosis. There is a 3 mm calcification unable to  be excluded from the plane of the distal right ureter / UVJ on series 2 axial 63, but this may be a phlebolith because it does not have a surrounding cuff of soft tissue. If this is a ureteral stone the findings are consistent with obstructive urolithiasis. If there is no ureteral stone, then the findings could be due to a recently passed stone or an ascending UTI. There are no calyceal stones in either kidney. No intravesical stone is seen. No other suggestion of ureteral stones. There is mild bladder thickening, which is probably due to chronic bladder outlet obstruction related to the enlarged prostate. The prostate measures up to 6.7 cm in diameter with a prominent impression into the inferior  bladder by disproportionate median lobe hypertrophy. There are multiple pelvic phleboliths. GI AND BOWEL: Stomach demonstrates no acute abnormality. There is no bowel obstruction or inflammation. The appendix is normal caliber. PERITONEUM AND RETROPERITONEUM: There is trace pelvic ascites. No free air. Slight mesenteric congestion. VASCULATURE: Aorta is normal in caliber. There is heavy aortic and branch vessel atherosclerosis without aneurysm. LYMPH NODES: No lymphadenopathy. REPRODUCTIVE ORGANS: The prostate measures up to 6.7 cm in diameter with a prominent impression into the inferior bladder by disproportionate median lobe hypertrophy. BONES AND SOFT TISSUES: There is mild anterior wedging of the L2 vertebral body with increased patchy sclerosis in the L2 body. This could be a healing response such as due to a subacute compression fracture. Underlying metastasis is not excluded. Follow-up as indicated. Bone Scintigraphy or MRI without and with contrast may be helpful. There are bridging osteophytes over the anterior right SI joint. Mild edema in the body wall is seen. No focal soft tissue abnormality. IMPRESSION: 1. Right renal and perirenal edema, peripelvic and periureteral edema, and moderate hydroureteronephrosis. A 3 mm calcification at the distal right ureter/UVJ may represent a ureteral stone or a phlebolith. Findings are consistent with obstructive urolithiasis if this is a stone; alternatively, findings could be due to a recently passed stone or an ascending UTI. 2. Mild bladder thickening, likely due to chronic bladder outlet obstruction related to the enlarged prostate with disproportionate median lobe hypertrophy. 3. Mild anterior wedging of the L2 vertebral body with increased patchy sclerosis, possibly representing a healing subacute compression fracture. Underlying metastasis is not excluded. Follow-up as indicated, and bone scintigraphy or MRI without and with contrast may be helpful. 4. Mild  edema in the body wall and mesentery with minimal societies. Electronically signed by: Francis Quam MD 08/26/2024 01:20 AM EST RP Workstation: [id number]     Labs:   Basic Metabolic Panel: Recent Labs  Lab 09/04/24 0520 09/05/24 0503 09/06/24 0852 09/07/24 0521 09/08/24 0452  NA 141 140 138 137 134*  K 3.5 3.4* 3.9 4.3 4.6  CL 111 109 108 106 103  CO2 23 23 24 25 24   GLUCOSE 95 97 114* 85 141*  BUN 49* 44* 44* 38* 41*  CREATININE 4.85* 4.94* 4.76* 4.53* 4.41*  CALCIUM  8.8* 8.9 8.7* 8.9 8.8*   GFR Estimated Creatinine Clearance: 13.8 mL/min (A) (by C-G formula based on SCr of 4.41 mg/dL (H)). Liver Function Tests: No results for input(s): AST, ALT, ALKPHOS, BILITOT, PROT, ALBUMIN in the last 168 hours. No results for input(s): LIPASE, AMYLASE in the last 168 hours. No results for input(s): AMMONIA in the last 168 hours. Coagulation profile No results for input(s): INR, PROTIME in the last 168 hours.  CBC: Recent Labs  Lab 09/08/24 0452 09/09/24 0503  WBC 6.4 9.7  HGB  7.3* 8.2*  HCT 21.8* 24.3*  MCV 91.2 90.0  PLT 199 201   Cardiac Enzymes: No results for input(s): CKTOTAL, CKMB, CKMBINDEX, TROPONINI in the last 168 hours. BNP: Invalid input(s): POCBNP CBG: Recent Labs  Lab 09/04/24 1200  GLUCAP 142*   D-Dimer No results for input(s): DDIMER in the last 72 hours. Hgb A1c No results for input(s): HGBA1C in the last 72 hours. Lipid Profile No results for input(s): CHOL, HDL, LDLCALC, TRIG, CHOLHDL, LDLDIRECT in the last 72 hours. Thyroid  function studies No results for input(s): TSH, T4TOTAL, T3FREE, THYROIDAB in the last 72 hours.  Invalid input(s): FREET3 Anemia work up No results for input(s): VITAMINB12, FOLATE, FERRITIN, TIBC, IRON, RETICCTPCT in the last 72 hours. Microbiology No results found for this or any previous visit (from the past 240 hours).  Time coordinating  discharge: 45 minutes  Signed: Landyn Buckalew  [facility] 09/10/2024, 3:10 PM  "

## 2024-09-10 NOTE — Plan of Care (Signed)

## 2024-09-10 NOTE — Progress Notes (Signed)
 Report called to [facility] @ [facility] [employer]. All questions were answered and AVS placed in discharge package for facility.

## 2024-09-10 NOTE — TOC Transition Note (Signed)
 Transition of Care [facility]) - Discharge Note   Patient Details  Name: Cody Silva MRN: [id number] Date of Birth: 04-19-1946  Transition of Care [facility] CM/SW Contact:  Alfonse JONELLE Rex, RN Phone Number: 09/10/2024, 3:45 PM   Clinical Narrative:   DC to [facility] for transport. Pt's spouse notified. No further INPT CM needs identified at this time.     Final next level of care: Skilled Nursing Facility Barriers to Discharge: Barriers Resolved   Patient Goals and CMS Choice Patient states their goals for this hospitalization and ongoing recovery are:: Rehab CMS Medicare.gov Compare Post Acute Care list provided to:: Patient Represenative (must comment) Choice offered to / list presented to : Adult Children [place] ownership interest in [facility].provided to:: Adult Children    Discharge Placement              Patient chooses bed at: [facility] Patient to be transferred to facility by: [facility] Name of family member notified: Armour Dorothe Johann, Emergency Contact  [phone] (Mobile) Patient and family notified of of transfer: 09/10/24  Discharge Plan and Services Additional resources added to the After Visit Summary for     Discharge Planning Services: CM Consult Post Acute Care Choice: Skilled Nursing Facility                               Social Drivers of Health (SDOH) Interventions SDOH Screenings   Food Insecurity: No Food Insecurity (08/26/2024)  Housing: Low Risk (08/26/2024)  Transportation Needs: No Transportation Needs (08/26/2024)  Utilities: Not At Risk (08/26/2024)  Social Connections: Moderately Integrated (08/26/2024)  Tobacco Use: Low Risk (08/26/2024)     Readmission Risk Interventions    09/08/2024   10:10 AM 03/26/2024   10:14 AM  Readmission Risk Prevention Plan  Transportation Screening Complete Complete  PCP or Specialist Appt within 5-7 Days  Complete  Home Care Screening Complete Complete  Medication  Review (RN CM) Complete Complete
